# Patient Record
Sex: Female | Born: 1940
Health system: Southern US, Community
[De-identification: ages and names within clinical notes are randomized; demographics above are authoritative.]

## PROBLEM LIST (undated history)

## (undated) DIAGNOSIS — J45909 Unspecified asthma, uncomplicated: Secondary | ICD-10-CM

## (undated) DIAGNOSIS — M797 Fibromyalgia: Secondary | ICD-10-CM

## (undated) DIAGNOSIS — F4321 Adjustment disorder with depressed mood: Secondary | ICD-10-CM

## (undated) DIAGNOSIS — K219 Gastro-esophageal reflux disease without esophagitis: Secondary | ICD-10-CM

## (undated) DIAGNOSIS — K449 Diaphragmatic hernia without obstruction or gangrene: Secondary | ICD-10-CM

## (undated) DIAGNOSIS — E559 Vitamin D deficiency, unspecified: Secondary | ICD-10-CM

## (undated) DIAGNOSIS — G2581 Restless legs syndrome: Secondary | ICD-10-CM

## (undated) DIAGNOSIS — I776 Arteritis, unspecified: Secondary | ICD-10-CM

## (undated) DIAGNOSIS — I341 Nonrheumatic mitral (valve) prolapse: Secondary | ICD-10-CM

## (undated) DIAGNOSIS — I4891 Unspecified atrial fibrillation: Secondary | ICD-10-CM

## (undated) DIAGNOSIS — E213 Hyperparathyroidism, unspecified: Secondary | ICD-10-CM

## (undated) DIAGNOSIS — M3500 Sicca syndrome, unspecified: Secondary | ICD-10-CM

## (undated) DIAGNOSIS — M1990 Unspecified osteoarthritis, unspecified site: Secondary | ICD-10-CM

## (undated) DIAGNOSIS — I677 Cerebral arteritis, not elsewhere classified: Secondary | ICD-10-CM

## (undated) DIAGNOSIS — K317 Polyp of stomach and duodenum: Secondary | ICD-10-CM

## (undated) DIAGNOSIS — I420 Dilated cardiomyopathy: Secondary | ICD-10-CM

## (undated) DIAGNOSIS — I7300 Raynaud's syndrome without gangrene: Secondary | ICD-10-CM

## (undated) DIAGNOSIS — C569 Malignant neoplasm of unspecified ovary: Secondary | ICD-10-CM

## (undated) DIAGNOSIS — K589 Irritable bowel syndrome without diarrhea: Secondary | ICD-10-CM

## (undated) DIAGNOSIS — I495 Sick sinus syndrome: Secondary | ICD-10-CM

## (undated) DIAGNOSIS — J449 Chronic obstructive pulmonary disease, unspecified: Secondary | ICD-10-CM

## (undated) HISTORY — DX: Malignant neoplasm of unspecified ovary: C56.9

## (undated) HISTORY — PX: TUBAL LIGATION: SHX77

## (undated) HISTORY — DX: Vitamin D deficiency, unspecified: E55.9

## (undated) HISTORY — PX: CATARACT EXTRACTION: SUR2

## (undated) HISTORY — DX: Unspecified osteoarthritis, unspecified site: M19.90

## (undated) HISTORY — DX: Hyperparathyroidism, unspecified: E21.3

## (undated) HISTORY — PX: BREAST SURGERY: SHX581

## (undated) HISTORY — DX: Gastro-esophageal reflux disease without esophagitis: K21.9

## (undated) HISTORY — DX: Diaphragmatic hernia without obstruction or gangrene: K44.9

## (undated) HISTORY — DX: Polyp of stomach and duodenum: K31.7

## (undated) HISTORY — DX: Chronic obstructive pulmonary disease, unspecified: J44.9

## (undated) HISTORY — PX: BREAST BIOPSY: SHX20

## (undated) HISTORY — DX: Adjustment disorder with depressed mood: F43.21

## (undated) HISTORY — DX: Irritable bowel syndrome, unspecified: K58.9

## (undated) HISTORY — DX: Restless legs syndrome: G25.81

## (undated) HISTORY — DX: Dilated cardiomyopathy: I42.0

## (undated) HISTORY — DX: Sjogren syndrome, unspecified: M35.00

## (undated) HISTORY — PX: APPENDECTOMY: SHX54

## (undated) HISTORY — DX: Raynaud's syndrome without gangrene: I73.00

## (undated) HISTORY — DX: Nonrheumatic mitral (valve) prolapse: I34.1

## (undated) HISTORY — DX: Fibromyalgia: M79.7

## (undated) HISTORY — DX: Unspecified atrial fibrillation: I48.91

## (undated) HISTORY — DX: Arteritis, unspecified: I77.6

---

## 1960-01-15 HISTORY — PX: OOPHORECTOMY: SHX86

## 1978-01-14 HISTORY — PX: CARPAL TUNNEL RELEASE: SHX101

## 1980-01-15 HISTORY — PX: ABDOMINAL HYSTERECTOMY: SHX81

## 2003-01-15 HISTORY — PX: KNEE SURGERY: SHX244

## 2008-01-15 HISTORY — PX: CARPAL TUNNEL RELEASE: SHX101

## 2011-01-18 DIAGNOSIS — M542 Cervicalgia: Secondary | ICD-10-CM | POA: Diagnosis not present

## 2011-01-18 DIAGNOSIS — J45909 Unspecified asthma, uncomplicated: Secondary | ICD-10-CM | POA: Diagnosis not present

## 2011-01-18 DIAGNOSIS — J209 Acute bronchitis, unspecified: Secondary | ICD-10-CM | POA: Diagnosis not present

## 2011-02-06 DIAGNOSIS — R0609 Other forms of dyspnea: Secondary | ICD-10-CM | POA: Diagnosis not present

## 2011-02-06 DIAGNOSIS — R0602 Shortness of breath: Secondary | ICD-10-CM | POA: Diagnosis not present

## 2011-02-06 DIAGNOSIS — R05 Cough: Secondary | ICD-10-CM | POA: Diagnosis not present

## 2011-02-06 DIAGNOSIS — R0989 Other specified symptoms and signs involving the circulatory and respiratory systems: Secondary | ICD-10-CM | POA: Diagnosis not present

## 2011-02-07 DIAGNOSIS — I4891 Unspecified atrial fibrillation: Secondary | ICD-10-CM | POA: Diagnosis not present

## 2011-02-07 DIAGNOSIS — R609 Edema, unspecified: Secondary | ICD-10-CM | POA: Diagnosis not present

## 2011-02-07 DIAGNOSIS — J45909 Unspecified asthma, uncomplicated: Secondary | ICD-10-CM | POA: Diagnosis not present

## 2011-02-07 DIAGNOSIS — R5383 Other fatigue: Secondary | ICD-10-CM | POA: Diagnosis not present

## 2011-02-07 DIAGNOSIS — J019 Acute sinusitis, unspecified: Secondary | ICD-10-CM | POA: Diagnosis not present

## 2011-02-07 DIAGNOSIS — G8921 Chronic pain due to trauma: Secondary | ICD-10-CM | POA: Diagnosis not present

## 2011-02-07 DIAGNOSIS — R5381 Other malaise: Secondary | ICD-10-CM | POA: Diagnosis not present

## 2011-02-07 DIAGNOSIS — G4700 Insomnia, unspecified: Secondary | ICD-10-CM | POA: Diagnosis not present

## 2011-02-08 DIAGNOSIS — R609 Edema, unspecified: Secondary | ICD-10-CM | POA: Diagnosis not present

## 2011-02-08 DIAGNOSIS — R5381 Other malaise: Secondary | ICD-10-CM | POA: Diagnosis not present

## 2011-02-08 DIAGNOSIS — I4891 Unspecified atrial fibrillation: Secondary | ICD-10-CM | POA: Diagnosis not present

## 2011-02-08 DIAGNOSIS — R0602 Shortness of breath: Secondary | ICD-10-CM | POA: Diagnosis not present

## 2011-02-11 DIAGNOSIS — I4891 Unspecified atrial fibrillation: Secondary | ICD-10-CM | POA: Diagnosis not present

## 2011-02-11 DIAGNOSIS — I10 Essential (primary) hypertension: Secondary | ICD-10-CM | POA: Diagnosis not present

## 2011-02-11 DIAGNOSIS — I359 Nonrheumatic aortic valve disorder, unspecified: Secondary | ICD-10-CM | POA: Diagnosis not present

## 2011-02-18 DIAGNOSIS — R6889 Other general symptoms and signs: Secondary | ICD-10-CM | POA: Diagnosis not present

## 2011-02-18 DIAGNOSIS — I4891 Unspecified atrial fibrillation: Secondary | ICD-10-CM | POA: Diagnosis not present

## 2011-02-28 DIAGNOSIS — R7989 Other specified abnormal findings of blood chemistry: Secondary | ICD-10-CM | POA: Diagnosis not present

## 2011-03-04 DIAGNOSIS — M6530 Trigger finger, unspecified finger: Secondary | ICD-10-CM | POA: Diagnosis not present

## 2011-03-04 DIAGNOSIS — M1710 Unilateral primary osteoarthritis, unspecified knee: Secondary | ICD-10-CM | POA: Diagnosis not present

## 2011-03-11 DIAGNOSIS — M1710 Unilateral primary osteoarthritis, unspecified knee: Secondary | ICD-10-CM | POA: Diagnosis not present

## 2011-03-15 DIAGNOSIS — I4891 Unspecified atrial fibrillation: Secondary | ICD-10-CM | POA: Diagnosis not present

## 2011-03-18 DIAGNOSIS — G8929 Other chronic pain: Secondary | ICD-10-CM | POA: Diagnosis not present

## 2011-03-18 DIAGNOSIS — M25569 Pain in unspecified knee: Secondary | ICD-10-CM | POA: Diagnosis not present

## 2011-03-18 DIAGNOSIS — I10 Essential (primary) hypertension: Secondary | ICD-10-CM | POA: Diagnosis not present

## 2011-03-18 DIAGNOSIS — I4892 Unspecified atrial flutter: Secondary | ICD-10-CM | POA: Diagnosis not present

## 2011-03-18 DIAGNOSIS — G4700 Insomnia, unspecified: Secondary | ICD-10-CM | POA: Diagnosis not present

## 2011-03-29 DIAGNOSIS — R6889 Other general symptoms and signs: Secondary | ICD-10-CM | POA: Diagnosis not present

## 2011-03-29 DIAGNOSIS — I4891 Unspecified atrial fibrillation: Secondary | ICD-10-CM | POA: Diagnosis not present

## 2011-04-03 DIAGNOSIS — R6889 Other general symptoms and signs: Secondary | ICD-10-CM | POA: Diagnosis not present

## 2011-04-03 DIAGNOSIS — I4891 Unspecified atrial fibrillation: Secondary | ICD-10-CM | POA: Diagnosis not present

## 2011-04-04 DIAGNOSIS — M1710 Unilateral primary osteoarthritis, unspecified knee: Secondary | ICD-10-CM | POA: Diagnosis not present

## 2011-04-08 DIAGNOSIS — I4891 Unspecified atrial fibrillation: Secondary | ICD-10-CM | POA: Diagnosis not present

## 2011-04-08 DIAGNOSIS — R6889 Other general symptoms and signs: Secondary | ICD-10-CM | POA: Diagnosis not present

## 2011-04-23 DIAGNOSIS — R6889 Other general symptoms and signs: Secondary | ICD-10-CM | POA: Diagnosis not present

## 2011-04-23 DIAGNOSIS — I4891 Unspecified atrial fibrillation: Secondary | ICD-10-CM | POA: Diagnosis not present

## 2011-04-29 DIAGNOSIS — I4891 Unspecified atrial fibrillation: Secondary | ICD-10-CM | POA: Diagnosis not present

## 2011-04-29 DIAGNOSIS — R6889 Other general symptoms and signs: Secondary | ICD-10-CM | POA: Diagnosis not present

## 2011-05-06 DIAGNOSIS — R6889 Other general symptoms and signs: Secondary | ICD-10-CM | POA: Diagnosis not present

## 2011-05-06 DIAGNOSIS — I4891 Unspecified atrial fibrillation: Secondary | ICD-10-CM | POA: Diagnosis not present

## 2011-05-13 DIAGNOSIS — R6889 Other general symptoms and signs: Secondary | ICD-10-CM | POA: Diagnosis not present

## 2011-05-13 DIAGNOSIS — M6281 Muscle weakness (generalized): Secondary | ICD-10-CM | POA: Diagnosis not present

## 2011-05-13 DIAGNOSIS — R509 Fever, unspecified: Secondary | ICD-10-CM | POA: Diagnosis not present

## 2011-05-13 DIAGNOSIS — J45909 Unspecified asthma, uncomplicated: Secondary | ICD-10-CM | POA: Diagnosis not present

## 2011-05-13 DIAGNOSIS — I10 Essential (primary) hypertension: Secondary | ICD-10-CM | POA: Diagnosis not present

## 2011-05-13 DIAGNOSIS — R4589 Other symptoms and signs involving emotional state: Secondary | ICD-10-CM | POA: Diagnosis not present

## 2011-05-13 DIAGNOSIS — R51 Headache: Secondary | ICD-10-CM | POA: Diagnosis not present

## 2011-05-13 DIAGNOSIS — M25569 Pain in unspecified knee: Secondary | ICD-10-CM | POA: Diagnosis not present

## 2011-05-13 DIAGNOSIS — E559 Vitamin D deficiency, unspecified: Secondary | ICD-10-CM | POA: Diagnosis not present

## 2011-05-13 DIAGNOSIS — M3500 Sicca syndrome, unspecified: Secondary | ICD-10-CM | POA: Diagnosis not present

## 2011-05-13 DIAGNOSIS — I4891 Unspecified atrial fibrillation: Secondary | ICD-10-CM | POA: Diagnosis not present

## 2011-05-13 DIAGNOSIS — R5381 Other malaise: Secondary | ICD-10-CM | POA: Diagnosis not present

## 2011-05-13 DIAGNOSIS — G8921 Chronic pain due to trauma: Secondary | ICD-10-CM | POA: Diagnosis not present

## 2011-05-13 DIAGNOSIS — R21 Rash and other nonspecific skin eruption: Secondary | ICD-10-CM | POA: Diagnosis not present

## 2011-05-13 DIAGNOSIS — IMO0001 Reserved for inherently not codable concepts without codable children: Secondary | ICD-10-CM | POA: Diagnosis not present

## 2011-05-15 DIAGNOSIS — R0602 Shortness of breath: Secondary | ICD-10-CM | POA: Diagnosis not present

## 2011-05-15 DIAGNOSIS — J209 Acute bronchitis, unspecified: Secondary | ICD-10-CM | POA: Diagnosis not present

## 2011-05-20 DIAGNOSIS — L089 Local infection of the skin and subcutaneous tissue, unspecified: Secondary | ICD-10-CM | POA: Diagnosis not present

## 2011-05-20 DIAGNOSIS — B079 Viral wart, unspecified: Secondary | ICD-10-CM | POA: Diagnosis not present

## 2011-05-28 DIAGNOSIS — H25019 Cortical age-related cataract, unspecified eye: Secondary | ICD-10-CM | POA: Diagnosis not present

## 2011-05-28 DIAGNOSIS — H16229 Keratoconjunctivitis sicca, not specified as Sjogren's, unspecified eye: Secondary | ICD-10-CM | POA: Diagnosis not present

## 2011-05-28 DIAGNOSIS — M3500 Sicca syndrome, unspecified: Secondary | ICD-10-CM | POA: Diagnosis not present

## 2011-05-28 DIAGNOSIS — H2510 Age-related nuclear cataract, unspecified eye: Secondary | ICD-10-CM | POA: Diagnosis not present

## 2011-06-13 DIAGNOSIS — I10 Essential (primary) hypertension: Secondary | ICD-10-CM | POA: Diagnosis not present

## 2011-06-14 DIAGNOSIS — I10 Essential (primary) hypertension: Secondary | ICD-10-CM | POA: Diagnosis not present

## 2011-06-14 DIAGNOSIS — R4589 Other symptoms and signs involving emotional state: Secondary | ICD-10-CM | POA: Diagnosis not present

## 2011-06-28 DIAGNOSIS — I4891 Unspecified atrial fibrillation: Secondary | ICD-10-CM | POA: Diagnosis not present

## 2011-06-28 DIAGNOSIS — R6889 Other general symptoms and signs: Secondary | ICD-10-CM | POA: Diagnosis not present

## 2011-07-02 DIAGNOSIS — J449 Chronic obstructive pulmonary disease, unspecified: Secondary | ICD-10-CM | POA: Diagnosis not present

## 2011-07-02 DIAGNOSIS — IMO0001 Reserved for inherently not codable concepts without codable children: Secondary | ICD-10-CM | POA: Diagnosis not present

## 2011-07-02 DIAGNOSIS — M3500 Sicca syndrome, unspecified: Secondary | ICD-10-CM | POA: Diagnosis not present

## 2011-07-02 DIAGNOSIS — M130 Polyarthritis, unspecified: Secondary | ICD-10-CM | POA: Diagnosis not present

## 2011-07-26 DIAGNOSIS — R4589 Other symptoms and signs involving emotional state: Secondary | ICD-10-CM | POA: Diagnosis not present

## 2011-07-26 DIAGNOSIS — G4700 Insomnia, unspecified: Secondary | ICD-10-CM | POA: Diagnosis not present

## 2011-07-26 DIAGNOSIS — R5381 Other malaise: Secondary | ICD-10-CM | POA: Diagnosis not present

## 2011-07-26 DIAGNOSIS — I10 Essential (primary) hypertension: Secondary | ICD-10-CM | POA: Diagnosis not present

## 2011-08-06 DIAGNOSIS — I10 Essential (primary) hypertension: Secondary | ICD-10-CM | POA: Diagnosis not present

## 2011-08-06 DIAGNOSIS — R5381 Other malaise: Secondary | ICD-10-CM | POA: Diagnosis not present

## 2011-08-06 DIAGNOSIS — I4892 Unspecified atrial flutter: Secondary | ICD-10-CM | POA: Diagnosis not present

## 2011-08-06 DIAGNOSIS — G8929 Other chronic pain: Secondary | ICD-10-CM | POA: Diagnosis not present

## 2011-08-06 DIAGNOSIS — IMO0001 Reserved for inherently not codable concepts without codable children: Secondary | ICD-10-CM | POA: Diagnosis not present

## 2011-08-06 DIAGNOSIS — R110 Nausea: Secondary | ICD-10-CM | POA: Diagnosis not present

## 2011-08-06 DIAGNOSIS — R509 Fever, unspecified: Secondary | ICD-10-CM | POA: Diagnosis not present

## 2011-08-06 DIAGNOSIS — R5383 Other fatigue: Secondary | ICD-10-CM | POA: Diagnosis not present

## 2011-08-15 DIAGNOSIS — R5381 Other malaise: Secondary | ICD-10-CM | POA: Diagnosis not present

## 2011-08-15 DIAGNOSIS — M6281 Muscle weakness (generalized): Secondary | ICD-10-CM | POA: Diagnosis not present

## 2011-08-15 DIAGNOSIS — M542 Cervicalgia: Secondary | ICD-10-CM | POA: Diagnosis not present

## 2011-08-15 DIAGNOSIS — I10 Essential (primary) hypertension: Secondary | ICD-10-CM | POA: Diagnosis not present

## 2011-08-15 DIAGNOSIS — G8929 Other chronic pain: Secondary | ICD-10-CM | POA: Diagnosis not present

## 2011-08-15 DIAGNOSIS — R4589 Other symptoms and signs involving emotional state: Secondary | ICD-10-CM | POA: Diagnosis not present

## 2011-08-15 DIAGNOSIS — I4892 Unspecified atrial flutter: Secondary | ICD-10-CM | POA: Diagnosis not present

## 2011-08-15 DIAGNOSIS — R5383 Other fatigue: Secondary | ICD-10-CM | POA: Diagnosis not present

## 2011-08-15 DIAGNOSIS — R509 Fever, unspecified: Secondary | ICD-10-CM | POA: Diagnosis not present

## 2011-08-19 DIAGNOSIS — R6889 Other general symptoms and signs: Secondary | ICD-10-CM | POA: Diagnosis not present

## 2011-08-19 DIAGNOSIS — IMO0001 Reserved for inherently not codable concepts without codable children: Secondary | ICD-10-CM | POA: Diagnosis not present

## 2011-08-19 DIAGNOSIS — I4891 Unspecified atrial fibrillation: Secondary | ICD-10-CM | POA: Diagnosis not present

## 2011-08-19 DIAGNOSIS — R7309 Other abnormal glucose: Secondary | ICD-10-CM | POA: Diagnosis not present

## 2011-08-22 DIAGNOSIS — I4891 Unspecified atrial fibrillation: Secondary | ICD-10-CM | POA: Diagnosis not present

## 2011-08-22 DIAGNOSIS — R6889 Other general symptoms and signs: Secondary | ICD-10-CM | POA: Diagnosis not present

## 2011-08-29 DIAGNOSIS — R6889 Other general symptoms and signs: Secondary | ICD-10-CM | POA: Diagnosis not present

## 2011-08-29 DIAGNOSIS — I4891 Unspecified atrial fibrillation: Secondary | ICD-10-CM | POA: Diagnosis not present

## 2011-09-30 DIAGNOSIS — J449 Chronic obstructive pulmonary disease, unspecified: Secondary | ICD-10-CM | POA: Diagnosis not present

## 2011-10-01 DIAGNOSIS — H47019 Ischemic optic neuropathy, unspecified eye: Secondary | ICD-10-CM | POA: Diagnosis not present

## 2011-10-01 DIAGNOSIS — H479 Unspecified disorder of visual pathways: Secondary | ICD-10-CM | POA: Diagnosis not present

## 2011-10-01 DIAGNOSIS — H47099 Other disorders of optic nerve, not elsewhere classified, unspecified eye: Secondary | ICD-10-CM | POA: Diagnosis not present

## 2011-10-07 DIAGNOSIS — R6889 Other general symptoms and signs: Secondary | ICD-10-CM | POA: Diagnosis not present

## 2011-10-07 DIAGNOSIS — I4891 Unspecified atrial fibrillation: Secondary | ICD-10-CM | POA: Diagnosis not present

## 2011-10-14 DIAGNOSIS — E039 Hypothyroidism, unspecified: Secondary | ICD-10-CM | POA: Diagnosis not present

## 2011-10-14 DIAGNOSIS — H5200 Hypermetropia, unspecified eye: Secondary | ICD-10-CM | POA: Diagnosis not present

## 2011-10-14 DIAGNOSIS — M1990 Unspecified osteoarthritis, unspecified site: Secondary | ICD-10-CM | POA: Diagnosis not present

## 2011-10-14 DIAGNOSIS — H2510 Age-related nuclear cataract, unspecified eye: Secondary | ICD-10-CM | POA: Diagnosis not present

## 2011-10-15 DIAGNOSIS — R6889 Other general symptoms and signs: Secondary | ICD-10-CM | POA: Diagnosis not present

## 2011-10-15 DIAGNOSIS — I4891 Unspecified atrial fibrillation: Secondary | ICD-10-CM | POA: Diagnosis not present

## 2011-10-21 DIAGNOSIS — IMO0001 Reserved for inherently not codable concepts without codable children: Secondary | ICD-10-CM | POA: Diagnosis not present

## 2011-10-21 DIAGNOSIS — R4589 Other symptoms and signs involving emotional state: Secondary | ICD-10-CM | POA: Diagnosis not present

## 2011-10-21 DIAGNOSIS — I4891 Unspecified atrial fibrillation: Secondary | ICD-10-CM | POA: Diagnosis not present

## 2011-10-21 DIAGNOSIS — I10 Essential (primary) hypertension: Secondary | ICD-10-CM | POA: Diagnosis not present

## 2011-10-21 DIAGNOSIS — W57XXXA Bitten or stung by nonvenomous insect and other nonvenomous arthropods, initial encounter: Secondary | ICD-10-CM | POA: Diagnosis not present

## 2011-10-21 DIAGNOSIS — R5381 Other malaise: Secondary | ICD-10-CM | POA: Diagnosis not present

## 2011-10-21 DIAGNOSIS — S90569A Insect bite (nonvenomous), unspecified ankle, initial encounter: Secondary | ICD-10-CM | POA: Diagnosis not present

## 2011-10-25 DIAGNOSIS — H2510 Age-related nuclear cataract, unspecified eye: Secondary | ICD-10-CM | POA: Diagnosis not present

## 2011-10-29 DIAGNOSIS — I4891 Unspecified atrial fibrillation: Secondary | ICD-10-CM | POA: Diagnosis not present

## 2011-10-29 DIAGNOSIS — R6889 Other general symptoms and signs: Secondary | ICD-10-CM | POA: Diagnosis not present

## 2011-11-01 DIAGNOSIS — M25569 Pain in unspecified knee: Secondary | ICD-10-CM | POA: Diagnosis not present

## 2011-11-01 DIAGNOSIS — IMO0001 Reserved for inherently not codable concepts without codable children: Secondary | ICD-10-CM | POA: Diagnosis not present

## 2011-11-01 DIAGNOSIS — I4892 Unspecified atrial flutter: Secondary | ICD-10-CM | POA: Diagnosis not present

## 2011-11-01 DIAGNOSIS — K2960 Other gastritis without bleeding: Secondary | ICD-10-CM | POA: Diagnosis not present

## 2011-11-01 DIAGNOSIS — M129 Arthropathy, unspecified: Secondary | ICD-10-CM | POA: Diagnosis not present

## 2011-11-01 DIAGNOSIS — I10 Essential (primary) hypertension: Secondary | ICD-10-CM | POA: Diagnosis not present

## 2011-11-01 DIAGNOSIS — R5381 Other malaise: Secondary | ICD-10-CM | POA: Diagnosis not present

## 2011-11-02 DIAGNOSIS — I4891 Unspecified atrial fibrillation: Secondary | ICD-10-CM | POA: Diagnosis not present

## 2011-11-04 DIAGNOSIS — IMO0001 Reserved for inherently not codable concepts without codable children: Secondary | ICD-10-CM | POA: Diagnosis not present

## 2011-11-04 DIAGNOSIS — M130 Polyarthritis, unspecified: Secondary | ICD-10-CM | POA: Diagnosis not present

## 2011-11-04 DIAGNOSIS — M3500 Sicca syndrome, unspecified: Secondary | ICD-10-CM | POA: Diagnosis not present

## 2011-11-04 DIAGNOSIS — M25559 Pain in unspecified hip: Secondary | ICD-10-CM | POA: Diagnosis not present

## 2011-11-06 DIAGNOSIS — R6889 Other general symptoms and signs: Secondary | ICD-10-CM | POA: Diagnosis not present

## 2011-11-06 DIAGNOSIS — M130 Polyarthritis, unspecified: Secondary | ICD-10-CM | POA: Diagnosis not present

## 2011-11-06 DIAGNOSIS — M3500 Sicca syndrome, unspecified: Secondary | ICD-10-CM | POA: Diagnosis not present

## 2011-11-06 DIAGNOSIS — I4891 Unspecified atrial fibrillation: Secondary | ICD-10-CM | POA: Diagnosis not present

## 2011-11-08 DIAGNOSIS — J019 Acute sinusitis, unspecified: Secondary | ICD-10-CM | POA: Diagnosis not present

## 2011-11-11 DIAGNOSIS — J449 Chronic obstructive pulmonary disease, unspecified: Secondary | ICD-10-CM | POA: Diagnosis not present

## 2011-11-12 DIAGNOSIS — Z7901 Long term (current) use of anticoagulants: Secondary | ICD-10-CM | POA: Diagnosis not present

## 2011-11-12 DIAGNOSIS — G2581 Restless legs syndrome: Secondary | ICD-10-CM | POA: Diagnosis not present

## 2011-11-12 DIAGNOSIS — I10 Essential (primary) hypertension: Secondary | ICD-10-CM | POA: Diagnosis not present

## 2011-11-12 DIAGNOSIS — E039 Hypothyroidism, unspecified: Secondary | ICD-10-CM | POA: Diagnosis not present

## 2011-11-12 DIAGNOSIS — M1990 Unspecified osteoarthritis, unspecified site: Secondary | ICD-10-CM | POA: Diagnosis not present

## 2011-11-12 DIAGNOSIS — J40 Bronchitis, not specified as acute or chronic: Secondary | ICD-10-CM | POA: Diagnosis not present

## 2011-11-12 DIAGNOSIS — IMO0001 Reserved for inherently not codable concepts without codable children: Secondary | ICD-10-CM | POA: Diagnosis not present

## 2011-11-12 DIAGNOSIS — H269 Unspecified cataract: Secondary | ICD-10-CM | POA: Diagnosis not present

## 2011-11-12 DIAGNOSIS — J45909 Unspecified asthma, uncomplicated: Secondary | ICD-10-CM | POA: Diagnosis not present

## 2011-11-12 DIAGNOSIS — H2510 Age-related nuclear cataract, unspecified eye: Secondary | ICD-10-CM | POA: Diagnosis not present

## 2011-11-12 DIAGNOSIS — K219 Gastro-esophageal reflux disease without esophagitis: Secondary | ICD-10-CM | POA: Diagnosis not present

## 2011-11-13 DIAGNOSIS — I4891 Unspecified atrial fibrillation: Secondary | ICD-10-CM | POA: Diagnosis not present

## 2011-11-13 DIAGNOSIS — R6889 Other general symptoms and signs: Secondary | ICD-10-CM | POA: Diagnosis not present

## 2011-11-15 DIAGNOSIS — M76899 Other specified enthesopathies of unspecified lower limb, excluding foot: Secondary | ICD-10-CM | POA: Diagnosis not present

## 2011-11-18 DIAGNOSIS — B079 Viral wart, unspecified: Secondary | ICD-10-CM | POA: Diagnosis not present

## 2011-11-18 DIAGNOSIS — L089 Local infection of the skin and subcutaneous tissue, unspecified: Secondary | ICD-10-CM | POA: Diagnosis not present

## 2011-11-19 DIAGNOSIS — F4321 Adjustment disorder with depressed mood: Secondary | ICD-10-CM | POA: Diagnosis not present

## 2011-11-21 DIAGNOSIS — H04129 Dry eye syndrome of unspecified lacrimal gland: Secondary | ICD-10-CM | POA: Diagnosis not present

## 2011-11-21 DIAGNOSIS — R6889 Other general symptoms and signs: Secondary | ICD-10-CM | POA: Diagnosis not present

## 2011-11-21 DIAGNOSIS — H2510 Age-related nuclear cataract, unspecified eye: Secondary | ICD-10-CM | POA: Diagnosis not present

## 2011-11-21 DIAGNOSIS — Z961 Presence of intraocular lens: Secondary | ICD-10-CM | POA: Diagnosis not present

## 2011-11-21 DIAGNOSIS — I4891 Unspecified atrial fibrillation: Secondary | ICD-10-CM | POA: Diagnosis not present

## 2011-11-25 DIAGNOSIS — F4321 Adjustment disorder with depressed mood: Secondary | ICD-10-CM | POA: Diagnosis not present

## 2011-11-25 DIAGNOSIS — Z1231 Encounter for screening mammogram for malignant neoplasm of breast: Secondary | ICD-10-CM | POA: Diagnosis not present

## 2011-11-25 DIAGNOSIS — Z803 Family history of malignant neoplasm of breast: Secondary | ICD-10-CM | POA: Diagnosis not present

## 2011-11-26 DIAGNOSIS — I4891 Unspecified atrial fibrillation: Secondary | ICD-10-CM | POA: Diagnosis not present

## 2011-11-26 DIAGNOSIS — H269 Unspecified cataract: Secondary | ICD-10-CM | POA: Diagnosis not present

## 2011-11-26 DIAGNOSIS — E039 Hypothyroidism, unspecified: Secondary | ICD-10-CM | POA: Diagnosis not present

## 2011-11-26 DIAGNOSIS — J45909 Unspecified asthma, uncomplicated: Secondary | ICD-10-CM | POA: Diagnosis not present

## 2011-11-26 DIAGNOSIS — I10 Essential (primary) hypertension: Secondary | ICD-10-CM | POA: Diagnosis not present

## 2011-11-26 DIAGNOSIS — IMO0001 Reserved for inherently not codable concepts without codable children: Secondary | ICD-10-CM | POA: Diagnosis not present

## 2011-11-26 DIAGNOSIS — M1990 Unspecified osteoarthritis, unspecified site: Secondary | ICD-10-CM | POA: Diagnosis not present

## 2011-11-26 DIAGNOSIS — K219 Gastro-esophageal reflux disease without esophagitis: Secondary | ICD-10-CM | POA: Diagnosis not present

## 2011-11-26 DIAGNOSIS — H2510 Age-related nuclear cataract, unspecified eye: Secondary | ICD-10-CM | POA: Diagnosis not present

## 2011-11-27 DIAGNOSIS — I4891 Unspecified atrial fibrillation: Secondary | ICD-10-CM | POA: Diagnosis not present

## 2011-11-27 DIAGNOSIS — R6889 Other general symptoms and signs: Secondary | ICD-10-CM | POA: Diagnosis not present

## 2011-11-29 DIAGNOSIS — H1130 Conjunctival hemorrhage, unspecified eye: Secondary | ICD-10-CM | POA: Diagnosis not present

## 2011-11-29 DIAGNOSIS — Z961 Presence of intraocular lens: Secondary | ICD-10-CM | POA: Diagnosis not present

## 2011-12-03 DIAGNOSIS — R928 Other abnormal and inconclusive findings on diagnostic imaging of breast: Secondary | ICD-10-CM | POA: Diagnosis not present

## 2011-12-03 DIAGNOSIS — Z803 Family history of malignant neoplasm of breast: Secondary | ICD-10-CM | POA: Diagnosis not present

## 2011-12-03 DIAGNOSIS — F4321 Adjustment disorder with depressed mood: Secondary | ICD-10-CM | POA: Diagnosis not present

## 2011-12-05 DIAGNOSIS — Z01419 Encounter for gynecological examination (general) (routine) without abnormal findings: Secondary | ICD-10-CM | POA: Diagnosis not present

## 2011-12-06 DIAGNOSIS — Z01419 Encounter for gynecological examination (general) (routine) without abnormal findings: Secondary | ICD-10-CM | POA: Diagnosis not present

## 2011-12-06 DIAGNOSIS — R4589 Other symptoms and signs involving emotional state: Secondary | ICD-10-CM | POA: Diagnosis not present

## 2011-12-06 DIAGNOSIS — R5383 Other fatigue: Secondary | ICD-10-CM | POA: Diagnosis not present

## 2011-12-06 DIAGNOSIS — I10 Essential (primary) hypertension: Secondary | ICD-10-CM | POA: Diagnosis not present

## 2011-12-06 DIAGNOSIS — M129 Arthropathy, unspecified: Secondary | ICD-10-CM | POA: Diagnosis not present

## 2011-12-06 DIAGNOSIS — Z0000 Encounter for general adult medical examination without abnormal findings: Secondary | ICD-10-CM | POA: Diagnosis not present

## 2011-12-06 DIAGNOSIS — I4891 Unspecified atrial fibrillation: Secondary | ICD-10-CM | POA: Diagnosis not present

## 2011-12-10 DIAGNOSIS — F4321 Adjustment disorder with depressed mood: Secondary | ICD-10-CM | POA: Diagnosis not present

## 2011-12-11 DIAGNOSIS — N6009 Solitary cyst of unspecified breast: Secondary | ICD-10-CM | POA: Diagnosis not present

## 2011-12-11 DIAGNOSIS — R928 Other abnormal and inconclusive findings on diagnostic imaging of breast: Secondary | ICD-10-CM | POA: Diagnosis not present

## 2011-12-11 DIAGNOSIS — N6089 Other benign mammary dysplasias of unspecified breast: Secondary | ICD-10-CM | POA: Diagnosis not present

## 2011-12-11 DIAGNOSIS — N6049 Mammary duct ectasia of unspecified breast: Secondary | ICD-10-CM | POA: Diagnosis not present

## 2011-12-11 DIAGNOSIS — N6489 Other specified disorders of breast: Secondary | ICD-10-CM | POA: Diagnosis not present

## 2011-12-16 DIAGNOSIS — Z961 Presence of intraocular lens: Secondary | ICD-10-CM | POA: Diagnosis not present

## 2011-12-17 DIAGNOSIS — F4321 Adjustment disorder with depressed mood: Secondary | ICD-10-CM | POA: Diagnosis not present

## 2011-12-20 DIAGNOSIS — R6889 Other general symptoms and signs: Secondary | ICD-10-CM | POA: Diagnosis not present

## 2011-12-20 DIAGNOSIS — I4891 Unspecified atrial fibrillation: Secondary | ICD-10-CM | POA: Diagnosis not present

## 2011-12-23 DIAGNOSIS — E039 Hypothyroidism, unspecified: Secondary | ICD-10-CM | POA: Diagnosis not present

## 2011-12-23 DIAGNOSIS — H538 Other visual disturbances: Secondary | ICD-10-CM | POA: Diagnosis not present

## 2011-12-23 DIAGNOSIS — R5383 Other fatigue: Secondary | ICD-10-CM | POA: Diagnosis not present

## 2011-12-23 DIAGNOSIS — R4589 Other symptoms and signs involving emotional state: Secondary | ICD-10-CM | POA: Diagnosis not present

## 2011-12-23 DIAGNOSIS — R5381 Other malaise: Secondary | ICD-10-CM | POA: Diagnosis not present

## 2011-12-23 DIAGNOSIS — I4891 Unspecified atrial fibrillation: Secondary | ICD-10-CM | POA: Diagnosis not present

## 2011-12-23 DIAGNOSIS — M129 Arthropathy, unspecified: Secondary | ICD-10-CM | POA: Diagnosis not present

## 2011-12-23 DIAGNOSIS — M542 Cervicalgia: Secondary | ICD-10-CM | POA: Diagnosis not present

## 2011-12-23 DIAGNOSIS — I10 Essential (primary) hypertension: Secondary | ICD-10-CM | POA: Diagnosis not present

## 2011-12-24 DIAGNOSIS — I10 Essential (primary) hypertension: Secondary | ICD-10-CM | POA: Diagnosis not present

## 2011-12-24 DIAGNOSIS — I4891 Unspecified atrial fibrillation: Secondary | ICD-10-CM | POA: Diagnosis not present

## 2011-12-24 DIAGNOSIS — I359 Nonrheumatic aortic valve disorder, unspecified: Secondary | ICD-10-CM | POA: Diagnosis not present

## 2011-12-26 DIAGNOSIS — Z23 Encounter for immunization: Secondary | ICD-10-CM | POA: Diagnosis not present

## 2012-01-02 DIAGNOSIS — F4321 Adjustment disorder with depressed mood: Secondary | ICD-10-CM | POA: Diagnosis not present

## 2012-01-13 DIAGNOSIS — Z7901 Long term (current) use of anticoagulants: Secondary | ICD-10-CM | POA: Diagnosis not present

## 2012-01-15 HISTORY — PX: REFRACTIVE SURGERY: SHX103

## 2012-02-10 DIAGNOSIS — Z7901 Long term (current) use of anticoagulants: Secondary | ICD-10-CM | POA: Diagnosis not present

## 2012-02-13 DIAGNOSIS — Z7901 Long term (current) use of anticoagulants: Secondary | ICD-10-CM | POA: Diagnosis not present

## 2012-02-20 DIAGNOSIS — S99919A Unspecified injury of unspecified ankle, initial encounter: Secondary | ICD-10-CM | POA: Diagnosis not present

## 2012-02-20 DIAGNOSIS — S8990XA Unspecified injury of unspecified lower leg, initial encounter: Secondary | ICD-10-CM | POA: Diagnosis not present

## 2012-03-05 DIAGNOSIS — M545 Low back pain, unspecified: Secondary | ICD-10-CM | POA: Diagnosis not present

## 2012-03-05 DIAGNOSIS — M1710 Unilateral primary osteoarthritis, unspecified knee: Secondary | ICD-10-CM | POA: Diagnosis not present

## 2012-03-19 DIAGNOSIS — M67919 Unspecified disorder of synovium and tendon, unspecified shoulder: Secondary | ICD-10-CM | POA: Diagnosis not present

## 2012-04-29 ENCOUNTER — Ambulatory Visit: Payer: Medicare Other | Admitting: *Deleted

## 2012-04-29 ENCOUNTER — Encounter (HOSPITAL_COMMUNITY): Payer: Self-pay

## 2012-04-29 ENCOUNTER — Emergency Department (HOSPITAL_COMMUNITY): Payer: Medicare Other

## 2012-04-29 ENCOUNTER — Emergency Department (HOSPITAL_COMMUNITY)
Admission: EM | Admit: 2012-04-29 | Discharge: 2012-04-29 | Disposition: A | Discharge status: Home / Self Care | Payer: Medicare Other | Attending: Emergency Medicine | Admitting: Emergency Medicine

## 2012-04-29 VITALS — BP 121/80 | HR 65 | Temp 98.4°F

## 2012-04-29 DIAGNOSIS — W64XXXA Exposure to other animate mechanical forces, initial encounter: Secondary | ICD-10-CM | POA: Insufficient documentation

## 2012-04-29 DIAGNOSIS — H539 Unspecified visual disturbance: Secondary | ICD-10-CM | POA: Insufficient documentation

## 2012-04-29 DIAGNOSIS — K219 Gastro-esophageal reflux disease without esophagitis: Secondary | ICD-10-CM | POA: Diagnosis not present

## 2012-04-29 DIAGNOSIS — S8990XA Unspecified injury of unspecified lower leg, initial encounter: Secondary | ICD-10-CM | POA: Insufficient documentation

## 2012-04-29 DIAGNOSIS — R51 Headache: Secondary | ICD-10-CM

## 2012-04-29 DIAGNOSIS — R5381 Other malaise: Secondary | ICD-10-CM | POA: Insufficient documentation

## 2012-04-29 DIAGNOSIS — S0990XA Unspecified injury of head, initial encounter: Secondary | ICD-10-CM

## 2012-04-29 DIAGNOSIS — I4891 Unspecified atrial fibrillation: Secondary | ICD-10-CM | POA: Insufficient documentation

## 2012-04-29 DIAGNOSIS — Z8739 Personal history of other diseases of the musculoskeletal system and connective tissue: Secondary | ICD-10-CM | POA: Insufficient documentation

## 2012-04-29 DIAGNOSIS — R42 Dizziness and giddiness: Secondary | ICD-10-CM

## 2012-04-29 DIAGNOSIS — Z8719 Personal history of other diseases of the digestive system: Secondary | ICD-10-CM | POA: Insufficient documentation

## 2012-04-29 DIAGNOSIS — M62838 Other muscle spasm: Secondary | ICD-10-CM | POA: Diagnosis not present

## 2012-04-29 DIAGNOSIS — S99929A Unspecified injury of unspecified foot, initial encounter: Secondary | ICD-10-CM | POA: Insufficient documentation

## 2012-04-29 DIAGNOSIS — Z79899 Other long term (current) drug therapy: Secondary | ICD-10-CM | POA: Diagnosis not present

## 2012-04-29 DIAGNOSIS — Z7901 Long term (current) use of anticoagulants: Secondary | ICD-10-CM | POA: Diagnosis not present

## 2012-04-29 DIAGNOSIS — Z8543 Personal history of malignant neoplasm of ovary: Secondary | ICD-10-CM | POA: Insufficient documentation

## 2012-04-29 DIAGNOSIS — G2581 Restless legs syndrome: Secondary | ICD-10-CM | POA: Insufficient documentation

## 2012-04-29 DIAGNOSIS — M1990 Unspecified osteoarthritis, unspecified site: Secondary | ICD-10-CM | POA: Insufficient documentation

## 2012-04-29 DIAGNOSIS — Y929 Unspecified place or not applicable: Secondary | ICD-10-CM | POA: Insufficient documentation

## 2012-04-29 DIAGNOSIS — J449 Chronic obstructive pulmonary disease, unspecified: Secondary | ICD-10-CM | POA: Diagnosis not present

## 2012-04-29 DIAGNOSIS — Z8679 Personal history of other diseases of the circulatory system: Secondary | ICD-10-CM | POA: Diagnosis not present

## 2012-04-29 DIAGNOSIS — J4489 Other specified chronic obstructive pulmonary disease: Secondary | ICD-10-CM | POA: Insufficient documentation

## 2012-04-29 DIAGNOSIS — Y9301 Activity, walking, marching and hiking: Secondary | ICD-10-CM | POA: Insufficient documentation

## 2012-04-29 HISTORY — DX: Cerebral arteritis, not elsewhere classified: I67.7

## 2012-04-29 HISTORY — DX: Unspecified asthma, uncomplicated: J45.909

## 2012-04-29 LAB — BASIC METABOLIC PANEL
BUN: 16 mg/dL (ref 6–23)
CO2: 28 mEq/L (ref 19–32)
Chloride: 101 mEq/L (ref 96–112)
Creatinine, Ser: 0.84 mg/dL (ref 0.50–1.10)
Glucose, Bld: 106 mg/dL — ABNORMAL HIGH (ref 70–99)

## 2012-04-29 NOTE — ED Provider Notes (Signed)
History     CSN: [id number]  Arrival date & time 04/29/12  1622   First MD Initiated Contact with Patient 04/29/12 1644      No chief complaint on file.   (Consider location/radiation/quality/duration/timing/severity/associated sxs/prior treatment) The history is provided by the patient.   5 days ago patient states she was out walking with her dog the dog raise his head up and hit her in the right thigh. She states her glasses were pushed back under her eye. She states that she's had some pain since. She states that she was having headaches before the accident but it has been worse and more on the right side of her head since. It is dull. She also states she has had difficulty seeing laterally out of the right eye before this and it is worse now. She also states she's felt a little more dizzy. She is unable to clearly state what this means but states it feels like she is having problems with her eyes. No confusion. She states she has had more pain in her knees also. She states she also feels as if she has had more problems in the muscles on her right lower extremity. She is on Coumadin for atrial fibrillation. She has a history of vasculitis also. No chest pain. She states she's also had some muscle cramps. No recent change in medications. She states her eyes have also been more dry also.  Past Medical History  Diagnosis Date  . A-fib   . Sjogren's syndrome   . Fibromyalgia   . Raynaud's disease   . GERD (gastroesophageal reflux disease)   . RLS (restless legs syndrome)   . Osteoarthritis   . IBS (irritable bowel syndrome)   . COPD (chronic obstructive pulmonary disease)   . Ovarian cancer     lymph node removal with hysterectomy  . MVP (mitral valve prolapse)   . Asthma   . Cerebral vasculitis     Past Surgical History  Procedure Laterality Date  . Appendectomy    . Abdominal hysterectomy    . Cataract extraction Left   . Breast surgery      Biopsy    Family History   Problem Relation Age of Onset  . COPD Mother   . Cancer Mother     Breast  . Heart disease Father   . Kidney disease Father   . Stroke Sister   . Arthritis/Rheumatoid Sister   . Stroke Paternal Grandmother   . Scleroderma Grandchild   . Thyroid disease Other     History  Substance Use Topics  . Smoking status: Never Smoker   . Smokeless tobacco: Never Used  . Alcohol Use: No    OB History   Grav Para Term Preterm Abortions TAB SAB Ect Mult Living                  Review of Systems  Constitutional: Positive for fatigue. Negative for activity change and appetite change.  HENT: Negative for neck stiffness.   Eyes: Positive for visual disturbance. Negative for photophobia and pain.  Respiratory: Negative for chest tightness and shortness of breath.   Cardiovascular: Negative for chest pain and leg swelling.  Gastrointestinal: Negative for nausea, vomiting, abdominal pain, diarrhea and blood in stool.  Genitourinary: Negative for flank pain.  Musculoskeletal: Positive for gait problem. Negative for back pain.  Skin: Negative for pallor, rash and wound.  Neurological: Positive for dizziness and headaches. Negative for weakness and numbness.  Psychiatric/Behavioral: Negative for  behavioral problems.    Allergies  Cephalosporins; Ciprofloxacin; Doxycycline; Nitrofuran derivatives; Nitrous oxide; Other; Penicillins; Sulfa antibiotics; Talwin; Amlodipine; Codeine; Diovan; Lisinopril; Tramadol; Zoloft; and Trovan  Home Medications   Current Outpatient Rx  Name  Route  Sig  Dispense  Refill  . albuterol (PROVENTIL HFA;VENTOLIN HFA) 108 (90 BASE) MCG/ACT inhaler   Inhalation   Inhale 2 puffs into the lungs every 6 (six) hours as needed for wheezing.         Marland Kitchen atenolol (TENORMIN) 50 MG tablet   Oral   Take 50 mg by mouth every morning.          Marland Kitchen atorvastatin (LIPITOR) 20 MG tablet   Oral   Take 20 mg by mouth at bedtime.          . budesonide-formoterol  (SYMBICORT) 160-4.5 MCG/ACT inhaler   Inhalation   Inhale 1 puff into the lungs 2 (two) times daily.         . bumetanide (BUMEX) 0.5 MG tablet   Oral   Take 0.5 mg by mouth every morning.         . Cholecalciferol (VITAMIN D) 2000 UNITS CAPS   Oral   Take 1 capsule by mouth every morning.         . cloNIDine (CATAPRES) 0.1 MG tablet   Oral   Take 0.1 mg by mouth 2 (two) times daily.         . cyclobenzaprine (FLEXERIL) 5 MG tablet   Oral   Take 5 mg by mouth 3 (three) times daily as needed for muscle spasms.         . cycloSPORINE (RESTASIS) 0.05 % ophthalmic emulsion   Both Eyes   Place 1 drop into both eyes 2 (two) times daily.          . diazepam (VALIUM) 5 MG tablet   Oral   Take 5 mg by mouth every 6 (six) hours as needed for anxiety.         Marland Kitchen esomeprazole (NEXIUM) 40 MG capsule   Oral   Take 40 mg by mouth daily before breakfast.         . fexofenadine (ALLEGRA) 180 MG tablet   Oral   Take 180 mg by mouth every morning.          Marland Kitchen levothyroxine (SYNTHROID, LEVOTHROID) 100 MCG tablet   Oral   Take 100 mcg by mouth every morning.         Marland Kitchen losartan (COZAAR) 100 MG tablet   Oral   Take 100 mg by mouth at bedtime.          . metroNIDAZOLE (METROGEL) 1 % gel   Topical   Apply 1 application topically daily.          . montelukast (SINGULAIR) 10 MG tablet   Oral   Take 10 mg by mouth at bedtime.         Marland Kitchen oxyCODONE (OXYCONTIN) 10 MG 12 hr tablet   Oral   Take 10 mg by mouth every 12 (twelve) hours.         . predniSONE (DELTASONE) 5 MG tablet   Oral   Take 5 mg by mouth daily.         . ranitidine (ZANTAC) 150 MG tablet   Oral   Take 150 mg by mouth daily as needed for heartburn.          . Simethicone (GAS-X EXTRA STRENGTH) 125 MG CAPS   Oral  Take 1 capsule by mouth daily as needed (for relief).         . Tapentadol HCl (NUCYNTA ER) 100 MG TB12   Oral   Take 100 mg by mouth at bedtime as needed (for  pain/sleep).          . traZODone (DESYREL) 150 MG tablet   Oral   Take 150 mg by mouth at bedtime.         Marland Kitchen warfarin (COUMADIN) 1 MG tablet   Oral   Take 1 mg by mouth as directed. 2 tabs Mon-Fri and 4 tabs on Sat and Sun         . albuterol (PROVENTIL) (2.5 MG/3ML) 0.083% nebulizer solution   Nebulization   Take 2.5 mg by nebulization every 6 (six) hours as needed for wheezing.           BP 142/77  Pulse 75  Temp(Src) 98.3 F (36.8 C) (Oral)  Resp 16  Ht 5\' 5"  (1.651 m)  Wt 150 lb (68.04 kg)  BMI 24.96 kg/m2  SpO2 97%  Physical Exam  Constitutional: She is oriented to person, place, and time. She appears well-developed and well-nourished.  HENT:  Head: Normocephalic and atraumatic.  Tender to right temporal area.   Eyes: Pupils are equal, round, and reactive to light.  Neck: Neck supple.  Cardiovascular: Normal rate.   Pulmonary/Chest: Effort normal.  Abdominal: Soft. Bowel sounds are normal.  Musculoskeletal: Normal range of motion.  Neurological: She is alert and oriented to person, place, and time.  Skin: Skin is warm and dry.    ED Course  Procedures (including critical care time)  Labs Reviewed  BASIC METABOLIC PANEL - Abnormal; Notable for the following:    Glucose, Bld 106 (*)    GFR calc non Af Amer 68 (*)    GFR calc Af Amer 79 (*)    All other components within normal limits  PROTIME-INR - Abnormal; Notable for the following:    Prothrombin Time 25.9 (*)    INR 2.51 (*)    All other components within normal limits  SEDIMENTATION RATE   Ct Head Wo Contrast  04/29/2012  *RADIOLOGY REPORT*  Clinical Data: Headache and face pressure and dizziness since trauma 5 days ago.  CT HEAD WITHOUT CONTRAST  Technique:  Contiguous axial images were obtained from the base of the skull through the vertex without contrast.  Comparison: None.  Findings: There is no acute intracranial hemorrhage, infarction, or mass lesion.  Brain parenchyma is normal.   Osseous structures are normal.  IMPRESSION: Normal exam.   Original Report Authenticated By: Francene Boyers, M.D.      1. Headache   2. Dizziness       MDM  Patient with headache and lightheadedness after getting hit in the head. Head CT is negative. Due to various complaints and previous vasculitis sedimentation rate was done and was normal. Patient is well-appearing and will be discharged home. She has a nonfocal examination. She'll followup with her primary care Dr.        Juliet Rude. Rubin Payor, MD 04/29/12 2112

## 2012-04-29 NOTE — ED Notes (Signed)
Pt reports last Friday night she was bending down to hook the leash to her dog.  Says the dog accidentally hit her in the face with his head and pushed her glasses into her eye.  Pt says since then her r eye hasn't "felt right" and is having pain in r side of head.  Also c/o nausea and dizziness.  Pt says is on coumadin for afib.  Reports had tick bite approx 1 week ago as well.

## 2012-04-29 NOTE — Progress Notes (Signed)
Pt was struck in the R eye/R side of head by her dog 5 nights ago.  Has had increasing head and facial pressure on the right side since then and c/o dizziness today.  She is on Coumadin.  Patient drove herself to our office.    CT scan most likely needed.  ED visit appropriate.  Patient can't drive herself and she refused EMS transport due to cost.  Patient was able to contact a family member who will transport her to [facility] for ED evaluation.

## 2012-04-29 NOTE — ED Notes (Signed)
Visual acuity  R-20/13 Left 20/15

## 2012-04-29 NOTE — Patient Instructions (Addendum)
Go to [facility] F/u PRN

## 2012-05-29 DIAGNOSIS — Z7901 Long term (current) use of anticoagulants: Secondary | ICD-10-CM | POA: Diagnosis not present

## 2012-06-23 DIAGNOSIS — F4322 Adjustment disorder with anxiety: Secondary | ICD-10-CM | POA: Diagnosis not present

## 2012-06-30 DIAGNOSIS — Z7901 Long term (current) use of anticoagulants: Secondary | ICD-10-CM | POA: Diagnosis not present

## 2012-07-14 DIAGNOSIS — F4322 Adjustment disorder with anxiety: Secondary | ICD-10-CM | POA: Diagnosis not present

## 2012-07-20 DIAGNOSIS — Z7901 Long term (current) use of anticoagulants: Secondary | ICD-10-CM | POA: Diagnosis not present

## 2012-07-27 DIAGNOSIS — F4322 Adjustment disorder with anxiety: Secondary | ICD-10-CM | POA: Diagnosis not present

## 2012-08-10 DIAGNOSIS — R928 Other abnormal and inconclusive findings on diagnostic imaging of breast: Secondary | ICD-10-CM | POA: Diagnosis not present

## 2012-08-10 DIAGNOSIS — Z803 Family history of malignant neoplasm of breast: Secondary | ICD-10-CM | POA: Diagnosis not present

## 2012-08-11 DIAGNOSIS — J449 Chronic obstructive pulmonary disease, unspecified: Secondary | ICD-10-CM | POA: Diagnosis not present

## 2012-08-11 DIAGNOSIS — R0989 Other specified symptoms and signs involving the circulatory and respiratory systems: Secondary | ICD-10-CM | POA: Diagnosis not present

## 2012-08-11 DIAGNOSIS — F4322 Adjustment disorder with anxiety: Secondary | ICD-10-CM | POA: Diagnosis not present

## 2012-08-11 DIAGNOSIS — R0609 Other forms of dyspnea: Secondary | ICD-10-CM | POA: Diagnosis not present

## 2012-08-11 DIAGNOSIS — R05 Cough: Secondary | ICD-10-CM | POA: Diagnosis not present

## 2012-08-14 DIAGNOSIS — B079 Viral wart, unspecified: Secondary | ICD-10-CM | POA: Diagnosis not present

## 2012-08-14 DIAGNOSIS — L570 Actinic keratosis: Secondary | ICD-10-CM | POA: Diagnosis not present

## 2012-08-14 DIAGNOSIS — L089 Local infection of the skin and subcutaneous tissue, unspecified: Secondary | ICD-10-CM | POA: Diagnosis not present

## 2012-08-17 DIAGNOSIS — Z961 Presence of intraocular lens: Secondary | ICD-10-CM | POA: Diagnosis not present

## 2012-08-17 DIAGNOSIS — H43399 Other vitreous opacities, unspecified eye: Secondary | ICD-10-CM | POA: Diagnosis not present

## 2012-08-17 DIAGNOSIS — H26499 Other secondary cataract, unspecified eye: Secondary | ICD-10-CM | POA: Diagnosis not present

## 2012-08-17 DIAGNOSIS — H04129 Dry eye syndrome of unspecified lacrimal gland: Secondary | ICD-10-CM | POA: Diagnosis not present

## 2012-08-17 DIAGNOSIS — H1130 Conjunctival hemorrhage, unspecified eye: Secondary | ICD-10-CM | POA: Diagnosis not present

## 2012-08-18 DIAGNOSIS — R6889 Other general symptoms and signs: Secondary | ICD-10-CM | POA: Diagnosis not present

## 2012-08-18 DIAGNOSIS — I4891 Unspecified atrial fibrillation: Secondary | ICD-10-CM | POA: Diagnosis not present

## 2012-08-18 DIAGNOSIS — IMO0001 Reserved for inherently not codable concepts without codable children: Secondary | ICD-10-CM | POA: Diagnosis not present

## 2012-08-18 DIAGNOSIS — M3500 Sicca syndrome, unspecified: Secondary | ICD-10-CM | POA: Diagnosis not present

## 2012-08-18 DIAGNOSIS — M130 Polyarthritis, unspecified: Secondary | ICD-10-CM | POA: Diagnosis not present

## 2012-08-18 DIAGNOSIS — M19029 Primary osteoarthritis, unspecified elbow: Secondary | ICD-10-CM | POA: Diagnosis not present

## 2012-08-18 DIAGNOSIS — M5030 Other cervical disc degeneration, unspecified cervical region: Secondary | ICD-10-CM | POA: Diagnosis not present

## 2012-08-18 DIAGNOSIS — Z86010 Personal history of colonic polyps: Secondary | ICD-10-CM | POA: Diagnosis not present

## 2012-08-18 DIAGNOSIS — K219 Gastro-esophageal reflux disease without esophagitis: Secondary | ICD-10-CM | POA: Diagnosis not present

## 2012-08-18 DIAGNOSIS — M25549 Pain in joints of unspecified hand: Secondary | ICD-10-CM | POA: Diagnosis not present

## 2012-08-21 DIAGNOSIS — R609 Edema, unspecified: Secondary | ICD-10-CM | POA: Diagnosis not present

## 2012-08-21 DIAGNOSIS — J45909 Unspecified asthma, uncomplicated: Secondary | ICD-10-CM | POA: Diagnosis not present

## 2012-08-21 DIAGNOSIS — IMO0001 Reserved for inherently not codable concepts without codable children: Secondary | ICD-10-CM | POA: Diagnosis not present

## 2012-08-21 DIAGNOSIS — M129 Arthropathy, unspecified: Secondary | ICD-10-CM | POA: Diagnosis not present

## 2012-08-21 DIAGNOSIS — M6281 Muscle weakness (generalized): Secondary | ICD-10-CM | POA: Diagnosis not present

## 2012-08-21 DIAGNOSIS — R5383 Other fatigue: Secondary | ICD-10-CM | POA: Diagnosis not present

## 2012-08-21 DIAGNOSIS — M542 Cervicalgia: Secondary | ICD-10-CM | POA: Diagnosis not present

## 2012-08-21 DIAGNOSIS — R5381 Other malaise: Secondary | ICD-10-CM | POA: Diagnosis not present

## 2012-08-24 DIAGNOSIS — F4322 Adjustment disorder with anxiety: Secondary | ICD-10-CM | POA: Diagnosis not present

## 2012-08-26 DIAGNOSIS — E7800 Pure hypercholesterolemia, unspecified: Secondary | ICD-10-CM | POA: Diagnosis not present

## 2012-08-26 DIAGNOSIS — I4891 Unspecified atrial fibrillation: Secondary | ICD-10-CM | POA: Diagnosis not present

## 2012-08-28 DIAGNOSIS — M75100 Unspecified rotator cuff tear or rupture of unspecified shoulder, not specified as traumatic: Secondary | ICD-10-CM | POA: Diagnosis not present

## 2012-09-01 DIAGNOSIS — F4322 Adjustment disorder with anxiety: Secondary | ICD-10-CM | POA: Diagnosis not present

## 2012-09-03 DIAGNOSIS — H26499 Other secondary cataract, unspecified eye: Secondary | ICD-10-CM | POA: Diagnosis not present

## 2012-09-03 DIAGNOSIS — Z961 Presence of intraocular lens: Secondary | ICD-10-CM | POA: Diagnosis not present

## 2012-09-15 DIAGNOSIS — F4322 Adjustment disorder with anxiety: Secondary | ICD-10-CM | POA: Diagnosis not present

## 2012-09-16 DIAGNOSIS — H5200 Hypermetropia, unspecified eye: Secondary | ICD-10-CM | POA: Diagnosis not present

## 2012-09-16 DIAGNOSIS — H04129 Dry eye syndrome of unspecified lacrimal gland: Secondary | ICD-10-CM | POA: Diagnosis not present

## 2012-09-16 DIAGNOSIS — Z961 Presence of intraocular lens: Secondary | ICD-10-CM | POA: Diagnosis not present

## 2012-09-16 DIAGNOSIS — H52229 Regular astigmatism, unspecified eye: Secondary | ICD-10-CM | POA: Diagnosis not present

## 2012-09-18 DIAGNOSIS — R6889 Other general symptoms and signs: Secondary | ICD-10-CM | POA: Diagnosis not present

## 2012-09-18 DIAGNOSIS — D649 Anemia, unspecified: Secondary | ICD-10-CM | POA: Diagnosis not present

## 2012-09-18 DIAGNOSIS — I4891 Unspecified atrial fibrillation: Secondary | ICD-10-CM | POA: Diagnosis not present

## 2012-09-25 DIAGNOSIS — M3500 Sicca syndrome, unspecified: Secondary | ICD-10-CM | POA: Diagnosis not present

## 2012-09-25 DIAGNOSIS — F329 Major depressive disorder, single episode, unspecified: Secondary | ICD-10-CM | POA: Diagnosis not present

## 2012-09-25 DIAGNOSIS — G589 Mononeuropathy, unspecified: Secondary | ICD-10-CM | POA: Diagnosis not present

## 2012-09-25 DIAGNOSIS — Z1211 Encounter for screening for malignant neoplasm of colon: Secondary | ICD-10-CM | POA: Diagnosis not present

## 2012-09-25 DIAGNOSIS — K5730 Diverticulosis of large intestine without perforation or abscess without bleeding: Secondary | ICD-10-CM | POA: Diagnosis not present

## 2012-09-25 DIAGNOSIS — Z888 Allergy status to other drugs, medicaments and biological substances status: Secondary | ICD-10-CM | POA: Diagnosis not present

## 2012-09-25 DIAGNOSIS — J449 Chronic obstructive pulmonary disease, unspecified: Secondary | ICD-10-CM | POA: Diagnosis not present

## 2012-09-25 DIAGNOSIS — Z885 Allergy status to narcotic agent status: Secondary | ICD-10-CM | POA: Diagnosis not present

## 2012-09-25 DIAGNOSIS — M1990 Unspecified osteoarthritis, unspecified site: Secondary | ICD-10-CM | POA: Diagnosis not present

## 2012-09-25 DIAGNOSIS — Z860100 Personal history of colon polyps, unspecified: Secondary | ICD-10-CM | POA: Diagnosis not present

## 2012-09-25 DIAGNOSIS — E785 Hyperlipidemia, unspecified: Secondary | ICD-10-CM | POA: Diagnosis not present

## 2012-09-25 DIAGNOSIS — Z882 Allergy status to sulfonamides status: Secondary | ICD-10-CM | POA: Diagnosis not present

## 2012-09-25 DIAGNOSIS — L719 Rosacea, unspecified: Secondary | ICD-10-CM | POA: Diagnosis not present

## 2012-09-25 DIAGNOSIS — Z880 Allergy status to penicillin: Secondary | ICD-10-CM | POA: Diagnosis not present

## 2012-09-25 DIAGNOSIS — Z883 Allergy status to other anti-infective agents status: Secondary | ICD-10-CM | POA: Diagnosis not present

## 2012-09-25 DIAGNOSIS — F411 Generalized anxiety disorder: Secondary | ICD-10-CM | POA: Diagnosis not present

## 2012-09-25 DIAGNOSIS — E039 Hypothyroidism, unspecified: Secondary | ICD-10-CM | POA: Diagnosis not present

## 2012-09-25 DIAGNOSIS — I10 Essential (primary) hypertension: Secondary | ICD-10-CM | POA: Diagnosis not present

## 2012-09-25 DIAGNOSIS — Z79899 Other long term (current) drug therapy: Secondary | ICD-10-CM | POA: Diagnosis not present

## 2012-09-25 DIAGNOSIS — K219 Gastro-esophageal reflux disease without esophagitis: Secondary | ICD-10-CM | POA: Diagnosis not present

## 2012-09-25 DIAGNOSIS — D126 Benign neoplasm of colon, unspecified: Secondary | ICD-10-CM | POA: Diagnosis not present

## 2012-09-29 DIAGNOSIS — L02419 Cutaneous abscess of limb, unspecified: Secondary | ICD-10-CM | POA: Diagnosis not present

## 2012-09-29 DIAGNOSIS — J449 Chronic obstructive pulmonary disease, unspecified: Secondary | ICD-10-CM | POA: Diagnosis not present

## 2012-09-29 DIAGNOSIS — R5381 Other malaise: Secondary | ICD-10-CM | POA: Diagnosis not present

## 2012-09-29 DIAGNOSIS — J441 Chronic obstructive pulmonary disease with (acute) exacerbation: Secondary | ICD-10-CM | POA: Diagnosis not present

## 2012-09-29 DIAGNOSIS — L278 Dermatitis due to other substances taken internally: Secondary | ICD-10-CM | POA: Diagnosis not present

## 2012-09-29 DIAGNOSIS — D649 Anemia, unspecified: Secondary | ICD-10-CM | POA: Diagnosis not present

## 2012-09-29 DIAGNOSIS — R509 Fever, unspecified: Secondary | ICD-10-CM | POA: Diagnosis not present

## 2012-09-29 DIAGNOSIS — IMO0001 Reserved for inherently not codable concepts without codable children: Secondary | ICD-10-CM | POA: Diagnosis not present

## 2012-09-29 DIAGNOSIS — K219 Gastro-esophageal reflux disease without esophagitis: Secondary | ICD-10-CM | POA: Diagnosis not present

## 2012-09-29 DIAGNOSIS — M130 Polyarthritis, unspecified: Secondary | ICD-10-CM | POA: Diagnosis not present

## 2012-09-29 DIAGNOSIS — S90569A Insect bite (nonvenomous), unspecified ankle, initial encounter: Secondary | ICD-10-CM | POA: Diagnosis not present

## 2012-09-29 DIAGNOSIS — F329 Major depressive disorder, single episode, unspecified: Secondary | ICD-10-CM | POA: Diagnosis not present

## 2012-09-29 DIAGNOSIS — G8929 Other chronic pain: Secondary | ICD-10-CM | POA: Diagnosis not present

## 2012-09-29 DIAGNOSIS — S0990XA Unspecified injury of head, initial encounter: Secondary | ICD-10-CM | POA: Diagnosis not present

## 2012-09-29 DIAGNOSIS — H538 Other visual disturbances: Secondary | ICD-10-CM | POA: Diagnosis not present

## 2012-09-29 DIAGNOSIS — M129 Arthropathy, unspecified: Secondary | ICD-10-CM | POA: Diagnosis not present

## 2012-09-30 DIAGNOSIS — H1130 Conjunctival hemorrhage, unspecified eye: Secondary | ICD-10-CM | POA: Diagnosis not present

## 2012-09-30 DIAGNOSIS — H04129 Dry eye syndrome of unspecified lacrimal gland: Secondary | ICD-10-CM | POA: Diagnosis not present

## 2012-10-02 DIAGNOSIS — H538 Other visual disturbances: Secondary | ICD-10-CM | POA: Diagnosis not present

## 2012-10-02 DIAGNOSIS — R6889 Other general symptoms and signs: Secondary | ICD-10-CM | POA: Diagnosis not present

## 2012-10-02 DIAGNOSIS — G43809 Other migraine, not intractable, without status migrainosus: Secondary | ICD-10-CM | POA: Diagnosis not present

## 2012-10-02 DIAGNOSIS — H479 Unspecified disorder of visual pathways: Secondary | ICD-10-CM | POA: Diagnosis not present

## 2012-10-02 DIAGNOSIS — I4891 Unspecified atrial fibrillation: Secondary | ICD-10-CM | POA: Diagnosis not present

## 2012-10-02 DIAGNOSIS — H04129 Dry eye syndrome of unspecified lacrimal gland: Secondary | ICD-10-CM | POA: Diagnosis not present

## 2012-10-05 DIAGNOSIS — F4322 Adjustment disorder with anxiety: Secondary | ICD-10-CM | POA: Diagnosis not present

## 2012-10-07 DIAGNOSIS — M899 Disorder of bone, unspecified: Secondary | ICD-10-CM | POA: Diagnosis not present

## 2012-10-12 DIAGNOSIS — R5381 Other malaise: Secondary | ICD-10-CM | POA: Diagnosis not present

## 2012-10-12 DIAGNOSIS — E559 Vitamin D deficiency, unspecified: Secondary | ICD-10-CM | POA: Diagnosis not present

## 2012-10-12 DIAGNOSIS — R079 Chest pain, unspecified: Secondary | ICD-10-CM | POA: Diagnosis not present

## 2012-10-12 DIAGNOSIS — IMO0001 Reserved for inherently not codable concepts without codable children: Secondary | ICD-10-CM | POA: Diagnosis not present

## 2012-10-12 DIAGNOSIS — R109 Unspecified abdominal pain: Secondary | ICD-10-CM | POA: Diagnosis not present

## 2012-10-13 DIAGNOSIS — F4322 Adjustment disorder with anxiety: Secondary | ICD-10-CM | POA: Diagnosis not present

## 2012-10-14 DIAGNOSIS — H04129 Dry eye syndrome of unspecified lacrimal gland: Secondary | ICD-10-CM | POA: Diagnosis not present

## 2012-10-14 DIAGNOSIS — H1030 Unspecified acute conjunctivitis, unspecified eye: Secondary | ICD-10-CM | POA: Diagnosis not present

## 2012-10-19 DIAGNOSIS — J449 Chronic obstructive pulmonary disease, unspecified: Secondary | ICD-10-CM | POA: Diagnosis not present

## 2012-10-19 DIAGNOSIS — R6889 Other general symptoms and signs: Secondary | ICD-10-CM | POA: Diagnosis not present

## 2012-10-20 DIAGNOSIS — M130 Polyarthritis, unspecified: Secondary | ICD-10-CM | POA: Diagnosis not present

## 2012-10-20 DIAGNOSIS — M3500 Sicca syndrome, unspecified: Secondary | ICD-10-CM | POA: Diagnosis not present

## 2012-10-20 DIAGNOSIS — IMO0001 Reserved for inherently not codable concepts without codable children: Secondary | ICD-10-CM | POA: Diagnosis not present

## 2012-10-20 DIAGNOSIS — I7300 Raynaud's syndrome without gangrene: Secondary | ICD-10-CM | POA: Diagnosis not present

## 2012-10-22 DIAGNOSIS — F3289 Other specified depressive episodes: Secondary | ICD-10-CM | POA: Diagnosis not present

## 2012-10-22 DIAGNOSIS — I4891 Unspecified atrial fibrillation: Secondary | ICD-10-CM | POA: Diagnosis not present

## 2012-10-22 DIAGNOSIS — IMO0001 Reserved for inherently not codable concepts without codable children: Secondary | ICD-10-CM | POA: Diagnosis not present

## 2012-10-22 DIAGNOSIS — F329 Major depressive disorder, single episode, unspecified: Secondary | ICD-10-CM | POA: Diagnosis not present

## 2012-10-22 DIAGNOSIS — R5381 Other malaise: Secondary | ICD-10-CM | POA: Diagnosis not present

## 2012-10-22 DIAGNOSIS — M129 Arthropathy, unspecified: Secondary | ICD-10-CM | POA: Diagnosis not present

## 2012-10-22 DIAGNOSIS — G8929 Other chronic pain: Secondary | ICD-10-CM | POA: Diagnosis not present

## 2012-10-22 DIAGNOSIS — I10 Essential (primary) hypertension: Secondary | ICD-10-CM | POA: Diagnosis not present

## 2012-10-22 DIAGNOSIS — N390 Urinary tract infection, site not specified: Secondary | ICD-10-CM | POA: Diagnosis not present

## 2012-10-22 DIAGNOSIS — Z23 Encounter for immunization: Secondary | ICD-10-CM | POA: Diagnosis not present

## 2012-10-28 DIAGNOSIS — F4322 Adjustment disorder with anxiety: Secondary | ICD-10-CM | POA: Diagnosis not present

## 2012-10-28 DIAGNOSIS — N281 Cyst of kidney, acquired: Secondary | ICD-10-CM | POA: Diagnosis not present

## 2012-10-29 DIAGNOSIS — M3500 Sicca syndrome, unspecified: Secondary | ICD-10-CM | POA: Diagnosis not present

## 2012-10-29 DIAGNOSIS — H00029 Hordeolum internum unspecified eye, unspecified eyelid: Secondary | ICD-10-CM | POA: Diagnosis not present

## 2012-10-29 DIAGNOSIS — H04129 Dry eye syndrome of unspecified lacrimal gland: Secondary | ICD-10-CM | POA: Diagnosis not present

## 2012-11-03 DIAGNOSIS — M129 Arthropathy, unspecified: Secondary | ICD-10-CM | POA: Diagnosis not present

## 2012-11-03 DIAGNOSIS — F329 Major depressive disorder, single episode, unspecified: Secondary | ICD-10-CM | POA: Diagnosis not present

## 2012-11-03 DIAGNOSIS — I10 Essential (primary) hypertension: Secondary | ICD-10-CM | POA: Diagnosis not present

## 2012-11-03 DIAGNOSIS — G4700 Insomnia, unspecified: Secondary | ICD-10-CM | POA: Diagnosis not present

## 2012-11-03 DIAGNOSIS — R5381 Other malaise: Secondary | ICD-10-CM | POA: Diagnosis not present

## 2012-11-03 DIAGNOSIS — IMO0001 Reserved for inherently not codable concepts without codable children: Secondary | ICD-10-CM | POA: Diagnosis not present

## 2012-11-03 DIAGNOSIS — R4589 Other symptoms and signs involving emotional state: Secondary | ICD-10-CM | POA: Diagnosis not present

## 2012-11-03 DIAGNOSIS — R52 Pain, unspecified: Secondary | ICD-10-CM | POA: Diagnosis not present

## 2012-11-05 DIAGNOSIS — IMO0001 Reserved for inherently not codable concepts without codable children: Secondary | ICD-10-CM | POA: Diagnosis not present

## 2012-11-05 DIAGNOSIS — G4700 Insomnia, unspecified: Secondary | ICD-10-CM | POA: Diagnosis not present

## 2012-11-05 DIAGNOSIS — F329 Major depressive disorder, single episode, unspecified: Secondary | ICD-10-CM | POA: Diagnosis not present

## 2012-11-05 DIAGNOSIS — R5381 Other malaise: Secondary | ICD-10-CM | POA: Diagnosis not present

## 2012-11-05 DIAGNOSIS — M129 Arthropathy, unspecified: Secondary | ICD-10-CM | POA: Diagnosis not present

## 2012-11-05 DIAGNOSIS — R4589 Other symptoms and signs involving emotional state: Secondary | ICD-10-CM | POA: Diagnosis not present

## 2012-11-05 DIAGNOSIS — I10 Essential (primary) hypertension: Secondary | ICD-10-CM | POA: Diagnosis not present

## 2012-11-10 DIAGNOSIS — F4322 Adjustment disorder with anxiety: Secondary | ICD-10-CM | POA: Diagnosis not present

## 2012-12-03 DIAGNOSIS — Z7901 Long term (current) use of anticoagulants: Secondary | ICD-10-CM | POA: Diagnosis not present

## 2013-02-01 DIAGNOSIS — F4322 Adjustment disorder with anxiety: Secondary | ICD-10-CM | POA: Diagnosis not present

## 2013-02-03 DIAGNOSIS — L739 Follicular disorder, unspecified: Secondary | ICD-10-CM | POA: Diagnosis not present

## 2013-02-03 DIAGNOSIS — L03039 Cellulitis of unspecified toe: Secondary | ICD-10-CM | POA: Diagnosis not present

## 2013-02-03 DIAGNOSIS — I789 Disease of capillaries, unspecified: Secondary | ICD-10-CM | POA: Diagnosis not present

## 2013-02-03 DIAGNOSIS — D045 Carcinoma in situ of skin of trunk: Secondary | ICD-10-CM | POA: Diagnosis not present

## 2013-02-03 DIAGNOSIS — Z85828 Personal history of other malignant neoplasm of skin: Secondary | ICD-10-CM | POA: Diagnosis not present

## 2013-02-03 DIAGNOSIS — L570 Actinic keratosis: Secondary | ICD-10-CM | POA: Diagnosis not present

## 2013-02-03 DIAGNOSIS — D485 Neoplasm of uncertain behavior of skin: Secondary | ICD-10-CM | POA: Diagnosis not present

## 2013-02-03 DIAGNOSIS — D2370 Other benign neoplasm of skin of unspecified lower limb, including hip: Secondary | ICD-10-CM | POA: Diagnosis not present

## 2013-02-03 DIAGNOSIS — L820 Inflamed seborrheic keratosis: Secondary | ICD-10-CM | POA: Diagnosis not present

## 2013-02-03 DIAGNOSIS — L821 Other seborrheic keratosis: Secondary | ICD-10-CM | POA: Diagnosis not present

## 2013-02-15 DIAGNOSIS — Z7901 Long term (current) use of anticoagulants: Secondary | ICD-10-CM | POA: Diagnosis not present

## 2013-03-16 DIAGNOSIS — Z7901 Long term (current) use of anticoagulants: Secondary | ICD-10-CM | POA: Diagnosis not present

## 2013-04-20 DIAGNOSIS — I4891 Unspecified atrial fibrillation: Secondary | ICD-10-CM | POA: Diagnosis not present

## 2013-04-20 DIAGNOSIS — Z7901 Long term (current) use of anticoagulants: Secondary | ICD-10-CM | POA: Diagnosis not present

## 2013-05-26 DIAGNOSIS — I4891 Unspecified atrial fibrillation: Secondary | ICD-10-CM | POA: Diagnosis not present

## 2013-06-01 DIAGNOSIS — F4322 Adjustment disorder with anxiety: Secondary | ICD-10-CM | POA: Diagnosis not present

## 2013-07-01 DIAGNOSIS — I4891 Unspecified atrial fibrillation: Secondary | ICD-10-CM | POA: Diagnosis not present

## 2013-07-27 ENCOUNTER — Encounter: Payer: Self-pay | Admitting: Family

## 2013-07-27 ENCOUNTER — Telehealth: Payer: Self-pay | Admitting: Family Medicine

## 2013-07-27 ENCOUNTER — Ambulatory Visit (INDEPENDENT_AMBULATORY_CARE_PROVIDER_SITE_OTHER): Payer: Medicare Other | Admitting: Family

## 2013-07-27 ENCOUNTER — Ambulatory Visit (INDEPENDENT_AMBULATORY_CARE_PROVIDER_SITE_OTHER): Payer: Medicare Other

## 2013-07-27 VITALS — BP 126/81 | HR 74 | Temp 97.6°F | Ht 65.0 in | Wt 146.2 lb

## 2013-07-27 DIAGNOSIS — I4891 Unspecified atrial fibrillation: Secondary | ICD-10-CM

## 2013-07-27 DIAGNOSIS — J069 Acute upper respiratory infection, unspecified: Secondary | ICD-10-CM

## 2013-07-27 DIAGNOSIS — R042 Hemoptysis: Secondary | ICD-10-CM | POA: Diagnosis not present

## 2013-07-27 DIAGNOSIS — I480 Paroxysmal atrial fibrillation: Secondary | ICD-10-CM

## 2013-07-27 DIAGNOSIS — I4821 Permanent atrial fibrillation: Secondary | ICD-10-CM | POA: Insufficient documentation

## 2013-07-27 DIAGNOSIS — Z7901 Long term (current) use of anticoagulants: Secondary | ICD-10-CM | POA: Diagnosis not present

## 2013-07-27 LAB — POCT CBC
GRANULOCYTE PERCENT: 80 % (ref 37–80)
HEMATOCRIT: 47.9 % (ref 37.7–47.9)
Hemoglobin: 15.4 g/dL (ref 12.2–16.2)
Lymph, poc: 1.4 (ref 0.6–3.4)
MCH: 29.2 pg (ref 27–31.2)
MCHC: 32.2 g/dL (ref 31.8–35.4)
MCV: 90.5 fL (ref 80–97)
MPV: 9.4 fL (ref 0–99.8)
PLATELET COUNT, POC: 152 10*3/uL (ref 142–424)
POC GRANULOCYTE: 6.2 (ref 2–6.9)
POC LYMPH %: 17.5 % (ref 10–50)
RBC: 5.3 M/uL (ref 4.04–5.48)
RDW, POC: 13.6 %
WBC: 7.8 10*3/uL (ref 4.6–10.2)

## 2013-07-27 LAB — POCT INR: INR: 5.5

## 2013-07-27 MED ORDER — BENZONATATE 200 MG PO CAPS: 200.0000 mg | ORAL_CAPSULE | Freq: Three times a day (TID) | ORAL | Status: DC | PRN

## 2013-07-27 MED ORDER — PHYTONADIONE 5 MG PO TABS
2.5000 mg | ORAL_TABLET | Freq: Once | ORAL | Status: AC
  Administered 2013-07-27: 2.5 mg via ORAL

## 2013-07-27 NOTE — Telephone Encounter (Signed)
appt given for 11 with christy

## 2013-07-27 NOTE — Patient Instructions (Addendum)
Upper Respiratory Infection, Adult An upper respiratory infection (URI) is also sometimes known as the common cold. The upper respiratory tract includes the nose, sinuses, throat, trachea, and bronchi. Bronchi are the airways leading to the lungs. Most people improve within 1 week, but symptoms can last up to 2 weeks. A residual cough may last even longer.  CAUSES Many different viruses can infect the tissues lining the upper respiratory tract. The tissues become irritated and inflamed and often become very moist. Mucus production is also common. A cold is contagious. You can easily spread the virus to others by oral contact. This includes kissing, sharing a glass, coughing, or sneezing. Touching your mouth or nose and then touching a surface, which is then touched by another person, can also spread the virus. SYMPTOMS  Symptoms typically develop 1 to 3 days after you come in contact with a cold virus. Symptoms vary from person to person. They may include:  Runny nose.  Sneezing.  Nasal congestion.  Sinus irritation.  Sore throat.  Loss of voice (laryngitis).  Cough.  Fatigue.  Muscle aches.  Loss of appetite.  Headache.  Low-grade fever. DIAGNOSIS  You might diagnose your own cold based on familiar symptoms, since most people get a cold 2 to 3 times a year. Your caregiver can confirm this based on your exam. Most importantly, your caregiver can check that your symptoms are not due to another disease such as strep throat, sinusitis, pneumonia, asthma, or epiglottitis. Blood tests, throat tests, and X-rays are not necessary to diagnose a common cold, but they may sometimes be helpful in excluding other more serious diseases. Your caregiver will decide if any further tests are required. RISKS AND COMPLICATIONS  You may be at risk for a more severe case of the common cold if you smoke cigarettes, have chronic heart disease (such as heart failure) or lung disease (such as asthma), or if  you have a weakened immune system. The very young and very old are also at risk for more serious infections. Bacterial sinusitis, middle ear infections, and bacterial pneumonia can complicate the common cold. The common cold can worsen asthma and chronic obstructive pulmonary disease (COPD). Sometimes, these complications can require emergency medical care and may be life-threatening. PREVENTION  The best way to protect against getting a cold is to practice good hygiene. Avoid oral or hand contact with people with cold symptoms. Wash your hands often if contact occurs. There is no clear evidence that vitamin C, vitamin E, echinacea, or exercise reduces the chance of developing a cold. However, it is always recommended to get plenty of rest and practice good nutrition. TREATMENT  Treatment is directed at relieving symptoms. There is no cure. Antibiotics are not effective, because the infection is caused by a virus, not by bacteria. Treatment may include:  Increased fluid intake. Sports drinks offer valuable electrolytes, sugars, and fluids.  Breathing heated mist or steam (vaporizer or shower).  Eating chicken soup or other clear broths, and maintaining good nutrition.  Getting plenty of rest.  Using gargles or lozenges for comfort.  Controlling fevers with ibuprofen or acetaminophen as directed by your caregiver.  Increasing usage of your inhaler if you have asthma. Zinc gel and zinc lozenges, taken in the first 24 hours of the common cold, can shorten the duration and lessen the severity of symptoms. Pain medicines may help with fever, muscle aches, and throat pain. A variety of non-prescription medicines are available to treat congestion and runny nose. Your caregiver   can make recommendations and may suggest nasal or lung inhalers for other symptoms.  HOME CARE INSTRUCTIONS   Only take over-the-counter or prescription medicines for pain, discomfort, or fever as directed by your  caregiver.  Use a warm mist humidifier or inhale steam from a shower to increase air moisture. This may keep secretions moist and make it easier to breathe.  Drink enough water and fluids to keep your urine clear or pale yellow.  Rest as needed.  Return to work when your temperature has returned to normal or as your caregiver advises. You may need to stay home longer to avoid infecting others. You can also use a face mask and careful hand washing to prevent spread of the virus. SEEK MEDICAL CARE IF:   After the first few days, you feel you are getting worse rather than better.  You need your caregiver's advice about medicines to control symptoms.  You develop chills, worsening shortness of breath, or brown or red sputum. These may be signs of pneumonia.  You develop yellow or brown nasal discharge or pain in the face, especially when you bend forward. These may be signs of sinusitis.  You develop a fever, swollen neck glands, pain with swallowing, or white areas in the back of your throat. These may be signs of strep throat. SEEK IMMEDIATE MEDICAL CARE IF:   You have a fever.  You develop severe or persistent headache, ear pain, sinus pain, or chest pain.  You develop wheezing, a prolonged cough, cough up blood, or have a change in your usual mucus (if you have chronic lung disease).  You develop sore muscles or a stiff neck. Document Released: 06/26/2000 Document Revised: 03/25/2011 Document Reviewed: 05/04/2010 [facility] Patient Information 2015 [place], [place]. This information is not intended to replace advice given to you by your health care provider. Make sure you discuss any questions you have with your health care provider.  - Take meds as prescribed - Use a cool mist humidifier  -Use saline nose sprays frequently -Saline irrigations of the nose can be very helpful if done frequently.  * 4X daily for 1 week*  * Use of a nettie pot can be helpful with this. Follow  directions with this* -Force fluids -For any cough or congestion  Use plain Mucinex- regular strength or max strength is fine   * Children- consult with Pharmacist for dosing -For fever or aces or pains- take tylenol or ibuprofen appropriate for age and weight.  * for fevers greater than 101 orally you may alternate ibuprofen and tylenol every  3 hours. -Throat lozenges if help -New toothbrush in 3 days   [name], FNP Anticoagulation Dose Instructions as of 07/27/2013     Dorene Grebe Tue Wed Thu Fri Sat   New Dose 4 mg 2 mg Hold Hold 2 mg 2 mg 2 mg   Alt Week 2 mg 2 mg 2 mg 2 mg 2 mg 2 mg 2 mg    Description       No warfarin today (07/27/13) or tomorrow (07/28/13).  Return to clinic Thursday 07/29/13 to check protime

## 2013-07-27 NOTE — Progress Notes (Signed)
Subjective:    Patient ID: Angela Wong, female    DOB: [birth year], 73 y.o.   MRN: [id number]  Cough This is a new problem. The current episode started in the past 7 days. The problem has been gradually worsening. The problem occurs every few minutes. The cough is productive of bloody sputum and productive of purulent sputum. Associated symptoms include chills, ear congestion, a fever, headaches, hemoptysis, postnasal drip and a sore throat. Pertinent negatives include no ear pain, nasal congestion or rhinorrhea. The symptoms are aggravated by exercise and lying down. She has tried rest (zpack) for the symptoms. The treatment provided mild relief. There is no history of COPD or pneumonia.      Review of Systems  Constitutional: Positive for fever and chills.  HENT: Positive for postnasal drip and sore throat. Negative for ear pain and rhinorrhea.   Respiratory: Positive for cough and hemoptysis.   Neurological: Positive for headaches.  All other systems reviewed and are negative.      Objective:   Physical Exam  Vitals reviewed. Constitutional: She is oriented to person, place, and time. She appears well-developed and well-nourished. No distress.  HENT:  Head: Normocephalic and atraumatic.  Right Ear: External ear normal.  Left Ear: External ear normal.  Oropharynx erythemas Nasal passage erythemas with mild swelling   Eyes: Pupils are equal, round, and reactive to light.  Neck: Normal range of motion. Neck supple. No thyromegaly present.  Cardiovascular: Normal rate, regular rhythm, normal heart sounds and intact distal pulses.   No murmur heard. Pulmonary/Chest: Effort normal and breath sounds normal. No respiratory distress. She has no wheezes.  Abdominal: Soft. Bowel sounds are normal. She exhibits no distension. There is no tenderness.  Musculoskeletal: Normal range of motion. She exhibits no edema and no tenderness.  Neurological: She is alert and oriented to person,  place, and time. She has normal reflexes. No cranial nerve deficit.  Skin: Skin is warm and dry.  Psychiatric: She has a normal mood and affect. Her behavior is normal. Judgment and thought content normal.    BP 126/81  Pulse 74  Temp(Src) 97.6 F (36.4 C) (Oral)  Ht 5\' 5"  (1.651 m)  Wt 146 lb 3.2 oz (66.316 kg)  BMI 24.33 kg/m2       Assessment & Plan:  1. Blood-tinged sputu - DG Chest 2 View; Future - POCT INR  2. Acute upper respiratory infections of unspecified site -- Take meds as prescribed - Use a cool mist humidifier  -Use saline nose sprays frequently -Saline irrigations of the nose can be very helpful if done frequently.  * 4X daily for 1 week*  * Use of a nettie pot can be helpful with this. Follow directions with this* -Force fluids -For any cough or congestion  Use plain Mucinex- regular strength or max strength is fine   * Children- consult with Pharmacist for dosing -For fever or aces or pains- take tylenol or ibuprofen appropriate for age and weight.  * for fevers greater than 101 orally you may alternate ibuprofen and tylenol every  3 hours. -Throat lozenges if help -New toothbrush in 3 day Meds ordered this encounter  Medications  . Cyanocobalamin (VITAMIN B 12 PO)    Sig: Take 500 mg by mouth daily.  Marland Kitchen DISCONTD: benzonatate (TESSALON) 200 MG capsule    Sig: Take 1 capsule (200 mg total) by mouth 3 (three) times daily as needed for cough.    Dispense:  20 capsule  Refill:  0    Order Specific Question:  Supervising Provider    Answer:  Chipper Herb [[id number]]  . benzonatate (TESSALON) 200 MG capsule    Sig: Take 1 capsule (200 mg total) by mouth 3 (three) times daily as needed for cough.    Dispense:  30 capsule    Refill:  1    Order Specific Question:  Supervising Provider    Answer:  Chipper Herb [[id number]]    3. Warfarin anticoagulation - POCT INR  [name], FNP

## 2013-07-27 NOTE — Addendum Note (Signed)
Addended by: Earlene Plater on: 07/27/2013 12:37 PM   Modules accepted: Orders

## 2013-07-27 NOTE — Addendum Note (Signed)
Addended by: Cherre Robins on: 07/27/2013 01:14 PM   Modules accepted: Orders

## 2013-07-29 ENCOUNTER — Ambulatory Visit (INDEPENDENT_AMBULATORY_CARE_PROVIDER_SITE_OTHER): Payer: Medicare Other | Admitting: Pharmacist

## 2013-07-29 DIAGNOSIS — Z7901 Long term (current) use of anticoagulants: Secondary | ICD-10-CM

## 2013-07-29 DIAGNOSIS — I4891 Unspecified atrial fibrillation: Secondary | ICD-10-CM

## 2013-07-29 DIAGNOSIS — I480 Paroxysmal atrial fibrillation: Secondary | ICD-10-CM

## 2013-07-29 LAB — POCT INR: INR: 1.5

## 2013-07-29 NOTE — Patient Instructions (Signed)
Anticoagulation Dose Instructions as of 07/29/2013     Dorene Grebe Tue Wed Thu Fri Sat   New Dose 2 mg 2 mg 2 mg 2 mg 2 mg 2 mg 2 mg    Description       Start warfarin 1mg  tablets - take 2 tablets daily      INR was 1.5 today

## 2013-08-05 ENCOUNTER — Ambulatory Visit (INDEPENDENT_AMBULATORY_CARE_PROVIDER_SITE_OTHER): Payer: Medicare Other | Admitting: Pharmacist

## 2013-08-05 DIAGNOSIS — Z7901 Long term (current) use of anticoagulants: Secondary | ICD-10-CM | POA: Diagnosis not present

## 2013-08-05 DIAGNOSIS — I4891 Unspecified atrial fibrillation: Secondary | ICD-10-CM

## 2013-08-05 DIAGNOSIS — I480 Paroxysmal atrial fibrillation: Secondary | ICD-10-CM

## 2013-08-05 LAB — POCT INR: INR: 2.7

## 2013-08-05 NOTE — Patient Instructions (Signed)
Anticoagulation Dose Instructions as of 08/05/2013     Angela Wong Tue Wed Thu Fri Sat   New Dose 1 mg 2 mg 2 mg 2 mg 1 mg 2 mg 2 mg    Description       Change warfarin to 1 tablet on Sundays and Thursdays.  Take 2 tablet on all other days      INR was 2.7 today

## 2013-08-19 ENCOUNTER — Ambulatory Visit (INDEPENDENT_AMBULATORY_CARE_PROVIDER_SITE_OTHER): Payer: Medicare Other | Admitting: Pharmacist

## 2013-08-19 DIAGNOSIS — I480 Paroxysmal atrial fibrillation: Secondary | ICD-10-CM

## 2013-08-19 DIAGNOSIS — I4891 Unspecified atrial fibrillation: Secondary | ICD-10-CM

## 2013-08-19 DIAGNOSIS — Z7901 Long term (current) use of anticoagulants: Secondary | ICD-10-CM | POA: Diagnosis not present

## 2013-08-19 LAB — POCT INR: INR: 1.4

## 2013-08-19 NOTE — Patient Instructions (Signed)
Anticoagulation Dose Instructions as of 08/19/2013     Angela Wong Tue Wed Thu Fri Sat   New Dose 3 mg 2 mg 2 mg 2 mg 3 mg 2 mg 2 mg    Description       Change warfarin to 3 tablet on Sundays and Thursdays.  Take 2 tablet on all other days      INR was 1.4 today

## 2013-08-25 ENCOUNTER — Ambulatory Visit (INDEPENDENT_AMBULATORY_CARE_PROVIDER_SITE_OTHER): Payer: Medicare Other | Admitting: Pharmacist

## 2013-08-25 DIAGNOSIS — I4891 Unspecified atrial fibrillation: Secondary | ICD-10-CM | POA: Diagnosis not present

## 2013-08-25 DIAGNOSIS — Z7901 Long term (current) use of anticoagulants: Secondary | ICD-10-CM

## 2013-08-25 DIAGNOSIS — I480 Paroxysmal atrial fibrillation: Secondary | ICD-10-CM

## 2013-08-25 LAB — POCT INR: INR: 1.7

## 2013-08-25 NOTE — Patient Instructions (Signed)
Anticoagulation Dose Instructions as of 08/25/2013     Angela Wong Tue Wed Thu Fri Sat   New Dose 4 mg 2 mg 2 mg 2 mg 2 mg 2 mg 4 mg    Description       Take 3 tablets today, then restart regular dose of 2mg  Monday through Friday and 4mg  on saturdays and sundays / weekends.      INR was 1.7 today

## 2013-09-01 ENCOUNTER — Ambulatory Visit (INDEPENDENT_AMBULATORY_CARE_PROVIDER_SITE_OTHER): Payer: Medicare Other | Admitting: Nurse Practitioner

## 2013-09-01 DIAGNOSIS — I480 Paroxysmal atrial fibrillation: Secondary | ICD-10-CM

## 2013-09-01 DIAGNOSIS — Z7901 Long term (current) use of anticoagulants: Secondary | ICD-10-CM | POA: Diagnosis not present

## 2013-09-01 DIAGNOSIS — I4891 Unspecified atrial fibrillation: Secondary | ICD-10-CM

## 2013-09-01 LAB — POCT INR: INR: 2

## 2013-09-01 NOTE — Patient Instructions (Signed)
Anticoagulation Dose Instructions as of 09/01/2013     Dorene Grebe Tue Wed Thu Fri Sat   New Dose 4 mg 2 mg 2 mg 2 mg 2 mg 2 mg 4 mg    Description       Take 3 tablets today, then restart regular dose of 2mg  Monday through Friday and 4mg  on saturdays and sundays / weekends.     Recheck in 1 month

## 2013-10-01 ENCOUNTER — Ambulatory Visit (INDEPENDENT_AMBULATORY_CARE_PROVIDER_SITE_OTHER): Payer: Medicare Other | Admitting: Pharmacist

## 2013-10-01 DIAGNOSIS — K219 Gastro-esophageal reflux disease without esophagitis: Secondary | ICD-10-CM | POA: Insufficient documentation

## 2013-10-01 DIAGNOSIS — Z7901 Long term (current) use of anticoagulants: Secondary | ICD-10-CM

## 2013-10-01 DIAGNOSIS — I4891 Unspecified atrial fibrillation: Secondary | ICD-10-CM | POA: Diagnosis not present

## 2013-10-01 DIAGNOSIS — I10 Essential (primary) hypertension: Secondary | ICD-10-CM

## 2013-10-01 DIAGNOSIS — Z8543 Personal history of malignant neoplasm of ovary: Secondary | ICD-10-CM | POA: Insufficient documentation

## 2013-10-01 DIAGNOSIS — Z8041 Family history of malignant neoplasm of ovary: Secondary | ICD-10-CM

## 2013-10-01 DIAGNOSIS — I480 Paroxysmal atrial fibrillation: Secondary | ICD-10-CM

## 2013-10-01 LAB — POCT INR: INR: 2.8

## 2013-10-01 NOTE — Patient Instructions (Signed)
Anticoagulation Dose Instructions as of 10/01/2013     Angela Wong Tue Wed Thu Fri Sat   New Dose 4 mg 2 mg 2 mg 2 mg 2 mg 2 mg 4 mg    Description       Continue regular warfarin dose of 2mg  Monday through Friday and 4mg  on saturdays and sundays / weekends.      INR was 2.8 today

## 2013-10-13 DIAGNOSIS — Z1231 Encounter for screening mammogram for malignant neoplasm of breast: Secondary | ICD-10-CM | POA: Diagnosis not present

## 2013-10-28 DIAGNOSIS — B351 Tinea unguium: Secondary | ICD-10-CM | POA: Diagnosis not present

## 2013-10-28 DIAGNOSIS — D1801 Hemangioma of skin and subcutaneous tissue: Secondary | ICD-10-CM | POA: Diagnosis not present

## 2013-10-28 DIAGNOSIS — D235 Other benign neoplasm of skin of trunk: Secondary | ICD-10-CM | POA: Diagnosis not present

## 2013-10-28 DIAGNOSIS — D2372 Other benign neoplasm of skin of left lower limb, including hip: Secondary | ICD-10-CM | POA: Diagnosis not present

## 2013-10-28 DIAGNOSIS — L304 Erythema intertrigo: Secondary | ICD-10-CM | POA: Diagnosis not present

## 2013-10-28 DIAGNOSIS — L821 Other seborrheic keratosis: Secondary | ICD-10-CM | POA: Diagnosis not present

## 2013-10-28 DIAGNOSIS — Z85828 Personal history of other malignant neoplasm of skin: Secondary | ICD-10-CM | POA: Diagnosis not present

## 2013-10-29 DIAGNOSIS — Z23 Encounter for immunization: Secondary | ICD-10-CM | POA: Diagnosis not present

## 2013-10-31 DIAGNOSIS — Z23 Encounter for immunization: Secondary | ICD-10-CM | POA: Diagnosis not present

## 2013-11-01 ENCOUNTER — Ambulatory Visit (INDEPENDENT_AMBULATORY_CARE_PROVIDER_SITE_OTHER): Payer: Medicare Other | Admitting: Pharmacist

## 2013-11-01 DIAGNOSIS — I480 Paroxysmal atrial fibrillation: Secondary | ICD-10-CM

## 2013-11-01 DIAGNOSIS — Z7901 Long term (current) use of anticoagulants: Secondary | ICD-10-CM | POA: Diagnosis not present

## 2013-11-01 LAB — POCT INR: INR: 3.3

## 2013-11-01 NOTE — Progress Notes (Signed)
Discussed influenza and pneumonia vaccine - per patient she received influenza vaccine 10/29/13 at [facility] and pneumonia 23 10/31/2013 at [facility] as well. Waiting for documentation to be sent from [facility].

## 2013-11-01 NOTE — Patient Instructions (Signed)
Anticoagulation Dose Instructions as of 11/01/2013     Angela Wong Tue Wed Thu Fri Sat   New Dose 4 mg 2 mg 2 mg 2 mg 2 mg 2 mg 4 mg    Description       No warfarin today - Monday, October 19th, then continue regular warfarin dose of 2mg  Monday through Friday and 4mg  on saturdays and sundays / weekends.      INR was 3.3 today

## 2013-11-02 ENCOUNTER — Encounter: Payer: Self-pay | Admitting: Family Medicine

## 2013-11-02 ENCOUNTER — Ambulatory Visit (INDEPENDENT_AMBULATORY_CARE_PROVIDER_SITE_OTHER): Payer: Medicare Other | Admitting: Family Medicine

## 2013-11-02 VITALS — BP 120/66 | HR 60 | Temp 96.8°F | Ht 65.0 in | Wt 145.0 lb

## 2013-11-02 DIAGNOSIS — Z881 Allergy status to other antibiotic agents status: Secondary | ICD-10-CM | POA: Insufficient documentation

## 2013-11-02 DIAGNOSIS — J069 Acute upper respiratory infection, unspecified: Secondary | ICD-10-CM

## 2013-11-02 DIAGNOSIS — J029 Acute pharyngitis, unspecified: Secondary | ICD-10-CM | POA: Diagnosis not present

## 2013-11-02 DIAGNOSIS — J020 Streptococcal pharyngitis: Secondary | ICD-10-CM

## 2013-11-02 DIAGNOSIS — Z889 Allergy status to unspecified drugs, medicaments and biological substances status: Secondary | ICD-10-CM | POA: Diagnosis not present

## 2013-11-02 LAB — POCT RAPID STREP A (OFFICE): RAPID STREP A SCREEN: NEGATIVE

## 2013-11-02 NOTE — Progress Notes (Signed)
Subjective:    Patient ID: Angela Wong, female    DOB: [birth year], 73 y.o.   MRN: [id number]  HPI Pt here for follow up and management of chronic medical problems. The patient comes in today with fatigue and a very sore throat. The patient had a flu shot 4 days ago. She had a pneumonia shot 2 days ago. She said in creasing problems with reflux and says that her COPD and fatigue are getting worse. The patient indicates that her son has had cervical spine surgery recently that she. She is also having to take care of 73 year old. was to make sure she's not contagious if she is around him          Patient Active Problem List   Diagnosis Date Noted  . History of ovarian cancer 10/01/2013  . GERD (gastroesophageal reflux disease) 10/01/2013  . Benign essential HTN 10/01/2013  . Atrial fibrillation 07/27/2013  . Long term (current) use of anticoagulants 07/27/2013   Outpatient Encounter Prescriptions as of 11/02/2013  Medication Sig  . albuterol (PROVENTIL HFA;VENTOLIN HFA) 108 (90 BASE) MCG/ACT inhaler Inhale 2 puffs into the lungs every 6 (six) hours as needed for wheezing.  Marland Kitchen albuterol (PROVENTIL) (2.5 MG/3ML) 0.083% nebulizer solution Take 2.5 mg by nebulization every 6 (six) hours as needed for wheezing.  Marland Kitchen atenolol (TENORMIN) 50 MG tablet Take 50 mg by mouth every morning.   Marland Kitchen atorvastatin (LIPITOR) 20 MG tablet Take 20 mg by mouth at bedtime.   . B Complex-C (B-COMPLEX WITH VITAMIN C) tablet Take 1 tablet by mouth daily.  . budesonide-formoterol (SYMBICORT) 80-4.5 MCG/ACT inhaler Inhale 1 puff into the lungs 2 (two) times daily.  . bumetanide (BUMEX) 0.5 MG tablet Take 0.5 mg by mouth every morning.  . Cholecalciferol (VITAMIN D) 2000 UNITS CAPS Take 1 capsule by mouth every morning.  . cloNIDine (CATAPRES) 0.1 MG tablet Take 0.1 mg by mouth 2 (two) times daily.  . Cyanocobalamin (VITAMIN B 12 PO) Take 500 mg by mouth daily.  . cyclobenzaprine (FLEXERIL) 5 MG tablet Take 5 mg  by mouth 3 (three) times daily as needed for muscle spasms. Take 1 or 2 tablets at bedtime as needed for restless leg syndrome  . cycloSPORINE (RESTASIS) 0.05 % ophthalmic emulsion Place 1 drop into both eyes 2 (two) times daily.   . diazepam (VALIUM) 5 MG tablet Take 5 mg by mouth every 6 (six) hours as needed for anxiety.  . docusate sodium (COLACE) 100 MG capsule Take 300 mg by mouth daily.  Marland Kitchen esomeprazole (NEXIUM) 40 MG capsule Take 40 mg by mouth daily before breakfast.  . fexofenadine (ALLEGRA) 180 MG tablet Take 180 mg by mouth every morning.   . Iron Polysacch Cmplx-B12-FA (POLYSACCHARIDE IRON FORTE) 150-0.025-1 MG CAPS Take 1 tablet by mouth daily.  Marland Kitchen levothyroxine (SYNTHROID, LEVOTHROID) 100 MCG tablet Take 100 mcg by mouth every morning.  Marland Kitchen losartan (COZAAR) 100 MG tablet Take 100 mg by mouth at bedtime.   . metroNIDAZOLE (METROGEL) 1 % gel Apply 1 application topically daily.   . montelukast (SINGULAIR) 10 MG tablet Take 10 mg by mouth at bedtime.  Marland Kitchen oxyCODONE (OXYCONTIN) 10 MG 12 hr tablet Take 10 mg by mouth every 12 (twelve) hours.  . predniSONE (DELTASONE) 5 MG tablet Take 5 mg by mouth daily.  . ranitidine (ZANTAC) 150 MG tablet Take 150 mg by mouth daily as needed for heartburn.   . Simethicone (GAS-X EXTRA STRENGTH) 125 MG CAPS Take 1 capsule by  mouth daily as needed (for relief).  . Tapentadol HCl (NUCYNTA ER) 100 MG TB12 Take 100 mg by mouth at bedtime as needed (for pain/sleep).   . traZODone (DESYREL) 150 MG tablet Take 150 mg by mouth at bedtime.  Marland Kitchen warfarin (COUMADIN) 1 MG tablet Take 1 mg by mouth as directed. 2 tabs Mon-Fri and 4 tabs on Sat and Sun    Review of Systems  Constitutional: Positive for fatigue. Negative for fever.  HENT: Positive for sore throat (very dry mouth ).   Eyes: Negative.   Respiratory: Negative.   Cardiovascular: Negative.   Gastrointestinal: Negative.   Endocrine: Negative.   Genitourinary: Negative.   Musculoskeletal: Negative.     Skin: Negative.   Allergic/Immunologic: Negative.   Neurological: Negative.   Hematological: Negative.   Psychiatric/Behavioral: Negative.        Objective:   Physical Exam  Nursing note and vitals reviewed. Constitutional: She is oriented to person, place, and time. She appears well-developed. No distress.  Alert somewhat thin  HENT:  Head: Normocephalic and atraumatic.  Right Ear: External ear normal.  Nose: Nose normal.  Mouth/Throat: Oropharynx is clear and moist. No oropharyngeal exudate.  Cerumen left ear canal  Eyes: Conjunctivae and EOM are normal. Pupils are equal, round, and reactive to light. Right eye exhibits no discharge. Left eye exhibits no discharge. No scleral icterus.  Neck: Normal range of motion. Neck supple. No thyromegaly present.  No anterior cervical nodes  Cardiovascular: Normal rate, regular rhythm and normal heart sounds.   No murmur heard. Pulmonary/Chest: Effort normal and breath sounds normal. No respiratory distress. She has no wheezes. She has no rales. She exhibits no tenderness.  Clear anteriorly and posteriorly  Abdominal: Soft. Bowel sounds are normal. She exhibits no mass. There is tenderness. There is no rebound and no guarding.  Epigastric tenderness  Musculoskeletal: Normal range of motion. She exhibits no edema.  Lymphadenopathy:    She has no cervical adenopathy.  Neurological: She is alert and oriented to person, place, and time.  Skin: Skin is warm and dry. No rash noted.  Psychiatric: She has a normal mood and affect. Her behavior is normal. Judgment and thought content normal.   BP 120/66  Pulse 60  Temp(Src) 96.8 F (36 C) (Oral)  Ht 5\' 5"  (1.651 m)  Wt 145 lb (65.772 kg)  BMI 24.13 kg/m2  Results for orders placed in visit on 11/02/13  POCT RAPID STREP A (OFFICE)      Result Value Ref Range   Rapid Strep A Screen Negative  Negative         Assessment & Plan:  1. Streptococcal sore throat - POCT rapid strep A -  Strep A culture, throat  2. Multiple drug allergies  3. Viral URI  4. Sore throat -Rapid strep is negative. - throat culture is pending  Patient Instructions  mucinex for cough and congestion AYR- nasal saline during the day AYR- gel at bedtime Rest as much as possible Plenty of fluids Tylenol for aches pains and fever  Increase Nexium to twice daily if increased reflux problem for one to 2 weeks   Dr Percival Spanish for cardio Dr Renford Dills for eyes [name] for pulmonary Rheumatology- Dr Amil Amen    Arrie Senate MD

## 2013-11-02 NOTE — Patient Instructions (Addendum)
mucinex for cough and congestion AYR- nasal saline during the day AYR- gel at bedtime Rest as much as possible Plenty of fluids Tylenol for aches pains and fever  Increase Nexium to twice daily if increased reflux problem for one to 2 weeks   Dr Percival Spanish for cardio Dr Renford Dills for eyes [name] for pulmonary Rheumatology- Dr Amil Amen

## 2013-11-04 ENCOUNTER — Encounter: Payer: Self-pay | Admitting: *Deleted

## 2013-11-04 LAB — STREP A CULTURE, THROAT: Strep A Culture: NEGATIVE

## 2013-11-08 ENCOUNTER — Encounter: Payer: Self-pay | Admitting: Family Medicine

## 2013-11-08 ENCOUNTER — Ambulatory Visit (INDEPENDENT_AMBULATORY_CARE_PROVIDER_SITE_OTHER): Payer: Medicare Other | Admitting: Family Medicine

## 2013-11-08 VITALS — BP 152/89 | HR 64 | Temp 97.0°F | Ht 65.0 in | Wt 142.0 lb

## 2013-11-08 DIAGNOSIS — I480 Paroxysmal atrial fibrillation: Secondary | ICD-10-CM

## 2013-11-08 DIAGNOSIS — Z7901 Long term (current) use of anticoagulants: Secondary | ICD-10-CM

## 2013-11-08 DIAGNOSIS — J208 Acute bronchitis due to other specified organisms: Secondary | ICD-10-CM | POA: Diagnosis not present

## 2013-11-08 LAB — POCT INR: INR: 3.2

## 2013-11-08 MED ORDER — PREDNISONE 10 MG PO TABS: ORAL_TABLET | ORAL | Status: DC

## 2013-11-08 MED ORDER — AZITHROMYCIN 250 MG PO TABS: ORAL_TABLET | ORAL | Status: DC

## 2013-11-08 MED ORDER — HYDROCODONE-HOMATROPINE 5-1.5 MG/5ML PO SYRP: 5.0000 mL | ORAL_SOLUTION | Freq: Three times a day (TID) | ORAL | Status: DC | PRN

## 2013-11-08 NOTE — Patient Instructions (Signed)
Anticoagulation Dose Instructions as of 11/08/2013     Angela Wong Tue Wed Thu Fri Sat   New Dose 2 mg 2 mg 2 mg 2 mg 2 mg 2 mg 4 mg    Description       No warfarin today - Monday, October 26th, then decrease warfarin dose to 2mg  Sunday through Friday and 4mg  on saturdays only.      INR was 3.2 today

## 2013-11-08 NOTE — Progress Notes (Signed)
   Subjective:    Patient ID: Angela Wong, female    DOB: 1940/04/16, 73 y.o.   MRN: [id number]  HPI Patient is here for c/o SOB and DOE.  She states she is having "copd exacerbation"  Review of Systems  Constitutional: Negative for fever.  HENT: Negative for ear pain.   Eyes: Negative for discharge.  Respiratory: Negative for cough.   Cardiovascular: Negative for chest pain.  Gastrointestinal: Negative for abdominal distention.  Endocrine: Negative for polyuria.  Genitourinary: Negative for difficulty urinating.  Musculoskeletal: Negative for gait problem and neck pain.  Skin: Negative for color change and rash.  Neurological: Negative for speech difficulty and headaches.  Psychiatric/Behavioral: Negative for agitation.       Objective:    BP 152/89  Pulse 64  Temp(Src) 97 F (36.1 C) (Oral)  Ht 5\' 5"  (1.651 m)  Wt 142 lb (64.411 kg)  BMI 23.63 kg/m2 Physical Exam  Constitutional: She is oriented to person, place, and time. She appears well-developed and well-nourished.  HENT:  Head: Normocephalic and atraumatic.  Mouth/Throat: Oropharynx is clear and moist.  Eyes: Pupils are equal, round, and reactive to light.  Neck: Normal range of motion. Neck supple.  Cardiovascular: Normal rate and regular rhythm.   No murmur heard. Pulmonary/Chest: Effort normal and breath sounds normal.  Abdominal: Soft. Bowel sounds are normal. There is no tenderness.  Neurological: She is alert and oriented to person, place, and time.  Skin: Skin is warm and dry.  Psychiatric: She has a normal mood and affect.          Assessment & Plan:     ICD-9-CM ICD-10-CM   1. Paroxysmal atrial fibrillation 427.31 I48.0 POCT INR  2. Long term current use of anticoagulant therapy V58.61 Z79.01 POCT INR  3. Acute bronchitis due to other specified organisms 466.0 J20.8 azithromycin (ZITHROMAX) 250 MG tablet     predniSONE (DELTASONE) 10 MG tablet     HYDROcodone-homatropine (HYCODAN) 5-1.5  MG/5ML syrup     No Follow-up on file.  Lysbeth Penner FNP

## 2013-11-10 ENCOUNTER — Ambulatory Visit: Payer: Self-pay | Admitting: Family Medicine

## 2013-11-15 ENCOUNTER — Ambulatory Visit (INDEPENDENT_AMBULATORY_CARE_PROVIDER_SITE_OTHER): Payer: Medicare Other | Admitting: Pharmacist

## 2013-11-15 DIAGNOSIS — I480 Paroxysmal atrial fibrillation: Secondary | ICD-10-CM | POA: Diagnosis not present

## 2013-11-15 DIAGNOSIS — Z7901 Long term (current) use of anticoagulants: Secondary | ICD-10-CM

## 2013-11-15 LAB — POCT INR: INR: 3

## 2013-11-15 NOTE — Patient Instructions (Signed)
Anticoagulation Dose Instructions as of 11/15/2013      Angela Wong Tue Wed Thu Fri Sat   New Dose 2 mg 2 mg 2 mg 2 mg 2 mg 2 mg 4 mg    Description        No warfarin today - Monday, November 2nd, then continue current warfarin dose to 2mg  Sunday through Friday and 4mg  on saturdays only.     INR was 3.0 today

## 2013-11-26 ENCOUNTER — Other Ambulatory Visit: Payer: Self-pay | Admitting: *Deleted

## 2013-11-26 ENCOUNTER — Ambulatory Visit (INDEPENDENT_AMBULATORY_CARE_PROVIDER_SITE_OTHER): Payer: Medicare Other | Admitting: Family Medicine

## 2013-11-26 ENCOUNTER — Other Ambulatory Visit: Payer: Self-pay | Admitting: Family Medicine

## 2013-11-26 ENCOUNTER — Encounter: Payer: Self-pay | Admitting: Family Medicine

## 2013-11-26 ENCOUNTER — Encounter (INDEPENDENT_AMBULATORY_CARE_PROVIDER_SITE_OTHER): Payer: Self-pay

## 2013-11-26 VITALS — BP 129/87 | HR 69 | Temp 96.9°F | Ht 65.0 in | Wt 153.9 lb

## 2013-11-26 DIAGNOSIS — R5381 Other malaise: Secondary | ICD-10-CM | POA: Diagnosis not present

## 2013-11-26 DIAGNOSIS — M3500 Sicca syndrome, unspecified: Secondary | ICD-10-CM

## 2013-11-26 DIAGNOSIS — J449 Chronic obstructive pulmonary disease, unspecified: Secondary | ICD-10-CM

## 2013-11-26 DIAGNOSIS — I4891 Unspecified atrial fibrillation: Secondary | ICD-10-CM

## 2013-11-26 DIAGNOSIS — I10 Essential (primary) hypertension: Secondary | ICD-10-CM | POA: Diagnosis not present

## 2013-11-26 DIAGNOSIS — R5383 Other fatigue: Secondary | ICD-10-CM | POA: Diagnosis not present

## 2013-11-26 DIAGNOSIS — I480 Paroxysmal atrial fibrillation: Secondary | ICD-10-CM

## 2013-11-26 DIAGNOSIS — J441 Chronic obstructive pulmonary disease with (acute) exacerbation: Secondary | ICD-10-CM

## 2013-11-26 DIAGNOSIS — E785 Hyperlipidemia, unspecified: Secondary | ICD-10-CM

## 2013-11-26 DIAGNOSIS — Z7901 Long term (current) use of anticoagulants: Secondary | ICD-10-CM

## 2013-11-26 DIAGNOSIS — H547 Unspecified visual loss: Secondary | ICD-10-CM

## 2013-11-26 LAB — POCT URINALYSIS DIPSTICK
Bilirubin, UA: NEGATIVE
Blood, UA: NEGATIVE
Glucose, UA: NEGATIVE
Ketones, UA: NEGATIVE
LEUKOCYTES UA: NEGATIVE
NITRITE UA: NEGATIVE
PROTEIN UA: NEGATIVE
Spec Grav, UA: <=1.005
UROBILINOGEN UA: NEGATIVE
pH, UA: 7.5

## 2013-11-26 LAB — POCT CBC
GRANULOCYTE PERCENT: 73.9 % (ref 37–80)
HEMATOCRIT: 42.6 % (ref 37.7–47.9)
Hemoglobin: 14.2 g/dL (ref 12.2–16.2)
Lymph, poc: 2 (ref 0.6–3.4)
MCH, POC: 30.6 pg (ref 27–31.2)
MCHC: 33.4 g/dL (ref 31.8–35.4)
MCV: 91.7 fL (ref 80–97)
MPV: 9.8 fL (ref 0–99.8)
PLATELET COUNT, POC: 160 10*3/uL (ref 142–424)
POC GRANULOCYTE: 6.2 (ref 2–6.9)
POC LYMPH %: 23.6 % (ref 10–50)
RBC: 4.6 M/uL (ref 4.04–5.48)
RDW, POC: 13.1 %
WBC: 8.4 10*3/uL (ref 4.6–10.2)

## 2013-11-26 LAB — POCT INR: INR: 2.4

## 2013-11-26 MED ORDER — CYCLOSPORINE 0.05 % OP EMUL: 1.0000 [drp] | Freq: Two times a day (BID) | OPHTHALMIC | Status: DC

## 2013-11-26 MED ORDER — RANITIDINE HCL 150 MG PO TABS: 150.0000 mg | ORAL_TABLET | Freq: Every day | ORAL | Status: DC | PRN

## 2013-11-26 MED ORDER — TAPENTADOL HCL ER 100 MG PO TB12: 100.0000 mg | ORAL_TABLET | Freq: Every evening | ORAL | Status: DC | PRN

## 2013-11-26 MED ORDER — LEVOTHYROXINE SODIUM 100 MCG PO TABS: 100.0000 ug | ORAL_TABLET | Freq: Every morning | ORAL | Status: DC

## 2013-11-26 MED ORDER — BUDESONIDE-FORMOTEROL FUMARATE 80-4.5 MCG/ACT IN AERO: 1.0000 | INHALATION_SPRAY | Freq: Two times a day (BID) | RESPIRATORY_TRACT | Status: DC

## 2013-11-26 MED ORDER — ATENOLOL 50 MG PO TABS: 50.0000 mg | ORAL_TABLET | Freq: Every morning | ORAL | Status: DC

## 2013-11-26 MED ORDER — BUMETANIDE 0.5 MG PO TABS: 0.5000 mg | ORAL_TABLET | Freq: Every morning | ORAL | Status: DC

## 2013-11-26 MED ORDER — IRON POLYSACCH CMPLX-B12-FA 150-0.025-1 MG PO CAPS: 1.0000 | ORAL_CAPSULE | Freq: Every day | ORAL | Status: DC

## 2013-11-26 MED ORDER — CYCLOBENZAPRINE HCL 5 MG PO TABS: 5.0000 mg | ORAL_TABLET | Freq: Three times a day (TID) | ORAL | Status: DC | PRN

## 2013-11-26 MED ORDER — OXYCODONE HCL ER 10 MG PO T12A: 10.0000 mg | EXTENDED_RELEASE_TABLET | Freq: Two times a day (BID) | ORAL | Status: DC

## 2013-11-26 MED ORDER — DIAZEPAM 5 MG PO TABS: 5.0000 mg | ORAL_TABLET | Freq: Four times a day (QID) | ORAL | Status: DC | PRN

## 2013-11-26 MED ORDER — LOSARTAN POTASSIUM 100 MG PO TABS: 100.0000 mg | ORAL_TABLET | Freq: Every day | ORAL | Status: DC

## 2013-11-26 MED ORDER — WARFARIN SODIUM 1 MG PO TABS: 1.0000 mg | ORAL_TABLET | ORAL | Status: DC

## 2013-11-26 MED ORDER — ESOMEPRAZOLE MAGNESIUM 40 MG PO CPDR: 40.0000 mg | DELAYED_RELEASE_CAPSULE | Freq: Every day | ORAL | Status: DC

## 2013-11-26 MED ORDER — ATORVASTATIN CALCIUM 20 MG PO TABS: 20.0000 mg | ORAL_TABLET | Freq: Every day | ORAL | Status: DC

## 2013-11-26 MED ORDER — MONTELUKAST SODIUM 10 MG PO TABS: 10.0000 mg | ORAL_TABLET | Freq: Every day | ORAL | Status: DC

## 2013-11-26 NOTE — Patient Instructions (Signed)
Anticoagulation Dose Instructions as of 11/26/2013      Angela Wong Tue Wed Thu Fri Sat   New Dose 2 mg 2 mg 2 mg 2 mg 2 mg 2 mg 4 mg    Description        Continue current warfarin dose to 2mg  Sunday through Friday and 4mg  on saturdays only.      INR was 2.4 today

## 2013-11-26 NOTE — Progress Notes (Signed)
   Subjective:    Patient ID: Angela Wong, female    DOB: Mar 09, 1940, 73 y.o.   MRN: [id number]  HPI 73 year old female who has been seen here briefly passed and is followed for anticoagulation that she takes for atrial failure. She is extremely complicated. She moved down here from [place] to be near her sons. She has multiple multiple drug allergies Conditions and surgeries include atrial fibrillation, Sjogren's syndrome, fibromyalgia, Raynaud syndrome, mitral valve prolapse, GERD, restless leg syndrome, osteoarthritis, especially left knee, hypertension, dysgerminoma left ovary (1962) carpal tunnel right hand, right breast biopsy 2, negative for cancer,. There are other surgeries and lesser diagnoses.    Review of Systems  Constitutional: Positive for fatigue.  HENT: Negative.   Eyes: Positive for visual disturbance.  Respiratory: Positive for cough and shortness of breath.   Cardiovascular: Positive for leg swelling.  Gastrointestinal:       GERD  Genitourinary: Positive for dysuria.  Musculoskeletal: Positive for arthralgias.  Skin: Positive for rash.       Objective:   Physical Exam  Constitutional: She appears well-developed and well-nourished.  HENT:  Head: Normocephalic.  Eyes: Pupils are equal, round, and reactive to light.  Cardiovascular:  Irregular rate consistent with atrial fibrillation  Pulmonary/Chest: Effort normal and breath sounds normal.  Abdominal: Soft.  Genitourinary:  Not examined has had total hysterectomy and partial salpingectomy     BP 129/87 mmHg  Pulse 69  Temp(Src) 96.9 F (36.1 C) (Oral)  Ht $R'5\' 5"'Th$  (1.651 m)  Wt 153 lb 14.4 oz (69.809 kg)  BMI 25.61 kg/m2     Assessment & Plan:  1. Paroxysmal atrial fibrillation Rate controlled and anticoagulated - POCT INR - POCT CBC  2. Long term current use of anticoagulant therapy  - POCT INR  3. Atrial fibrillation, unspecified   4. Benign essential HTN  - POCT urinalysis  dipstick - CMP14+EGFR  5. Hyperlipidemia  - Lipid panel  6. Malaise and fatigue  - TSH  7. Chronic obstructive pulmonary disease, unspecified COPD, unspecified chronic bronchitis type Breathing seems to be controlled although she states she is short of breath. Continue same meds  Wardell Honour MD

## 2013-11-26 NOTE — Addendum Note (Signed)
Addended by: Shelbie Ammons on: 11/26/2013 01:51 PM   Modules accepted: Orders

## 2013-11-27 LAB — LIPID PANEL
CHOLESTEROL TOTAL: 175 mg/dL (ref 100–199)
Chol/HDL Ratio: 1.8 ratio units (ref 0.0–4.4)
HDL: 95 mg/dL (ref >39)
LDL Calculated: 67 mg/dL (ref 0–99)
Triglycerides: 65 mg/dL (ref 0–149)
VLDL CHOLESTEROL CAL: 13 mg/dL (ref 5–40)

## 2013-11-27 LAB — CMP14+EGFR
ALT: 17 IU/L (ref 0–32)
AST: 14 IU/L (ref 0–40)
Albumin/Globulin Ratio: 2.1 (ref 1.1–2.5)
Albumin: 4.1 g/dL (ref 3.5–4.8)
Alkaline Phosphatase: 80 IU/L (ref 39–117)
BUN/Creatinine Ratio: 13 (ref 11–26)
BUN: 11 mg/dL (ref 8–27)
CALCIUM: 9 mg/dL (ref 8.7–10.3)
CHLORIDE: 100 mmol/L (ref 97–108)
CO2: 27 mmol/L (ref 18–29)
Creatinine, Ser: 0.82 mg/dL (ref 0.57–1.00)
GFR calc Af Amer: 82 mL/min/{1.73_m2} (ref >59)
GFR calc non Af Amer: 71 mL/min/{1.73_m2} (ref >59)
GLUCOSE: 84 mg/dL (ref 65–99)
Globulin, Total: 2 g/dL (ref 1.5–4.5)
POTASSIUM: 3.7 mmol/L (ref 3.5–5.2)
SODIUM: 142 mmol/L (ref 134–144)
TOTAL PROTEIN: 6.1 g/dL (ref 6.0–8.5)
Total Bilirubin: 0.9 mg/dL (ref 0.0–1.2)

## 2013-11-27 LAB — TSH: TSH: 1.28 u[IU]/mL (ref 0.450–4.500)

## 2013-11-29 ENCOUNTER — Other Ambulatory Visit: Payer: Self-pay | Admitting: Family Medicine

## 2013-11-29 NOTE — Telephone Encounter (Signed)
Patient is requesting prednisone 5mg  daily as was on her medication list.   If aproved route back to nurse pool A and it can be called or sent in. She got the singulair refill.

## 2013-12-01 ENCOUNTER — Other Ambulatory Visit: Payer: Self-pay | Admitting: *Deleted

## 2013-12-01 MED ORDER — PREDNISONE 5 MG PO TABS: 5.0000 mg | ORAL_TABLET | Freq: Every day | ORAL | Status: DC

## 2013-12-01 NOTE — Telephone Encounter (Signed)
Informed pt refilled prednisone & symbicort went last week. Pt informed me that yes her symbicort was there it was just too early with her insurance.

## 2013-12-01 NOTE — Telephone Encounter (Signed)
We met to refill all prescriptions so okay to call this prescription in to [facility] same dose and direction

## 2013-12-06 ENCOUNTER — Telehealth: Payer: Self-pay | Admitting: *Deleted

## 2013-12-06 NOTE — Telephone Encounter (Signed)
Aware,scripts were done.

## 2013-12-07 DIAGNOSIS — G43109 Migraine with aura, not intractable, without status migrainosus: Secondary | ICD-10-CM | POA: Diagnosis not present

## 2013-12-13 ENCOUNTER — Encounter: Payer: Self-pay | Admitting: Internal Medicine

## 2013-12-13 ENCOUNTER — Ambulatory Visit (INDEPENDENT_AMBULATORY_CARE_PROVIDER_SITE_OTHER): Payer: Medicare Other | Admitting: Internal Medicine

## 2013-12-13 VITALS — BP 144/80 | HR 75 | Ht 65.0 in | Wt 150.0 lb

## 2013-12-13 DIAGNOSIS — R0600 Dyspnea, unspecified: Secondary | ICD-10-CM | POA: Diagnosis not present

## 2013-12-13 DIAGNOSIS — R5383 Other fatigue: Secondary | ICD-10-CM | POA: Diagnosis not present

## 2013-12-13 DIAGNOSIS — Z87312 Personal history of (healed) stress fracture: Secondary | ICD-10-CM | POA: Diagnosis not present

## 2013-12-13 DIAGNOSIS — M2550 Pain in unspecified joint: Secondary | ICD-10-CM | POA: Diagnosis not present

## 2013-12-13 DIAGNOSIS — M79672 Pain in left foot: Secondary | ICD-10-CM | POA: Diagnosis not present

## 2013-12-13 DIAGNOSIS — M1990 Unspecified osteoarthritis, unspecified site: Secondary | ICD-10-CM | POA: Diagnosis not present

## 2013-12-13 DIAGNOSIS — R5382 Chronic fatigue, unspecified: Secondary | ICD-10-CM | POA: Diagnosis not present

## 2013-12-13 DIAGNOSIS — M1610 Unilateral primary osteoarthritis, unspecified hip: Secondary | ICD-10-CM | POA: Diagnosis not present

## 2013-12-13 DIAGNOSIS — M19071 Primary osteoarthritis, right ankle and foot: Secondary | ICD-10-CM | POA: Diagnosis not present

## 2013-12-13 DIAGNOSIS — M3500 Sicca syndrome, unspecified: Secondary | ICD-10-CM | POA: Diagnosis not present

## 2013-12-13 DIAGNOSIS — G8929 Other chronic pain: Secondary | ICD-10-CM | POA: Diagnosis not present

## 2013-12-13 MED ORDER — RANITIDINE HCL 150 MG PO TABS: ORAL_TABLET | ORAL | Status: DC

## 2013-12-13 MED ORDER — BUDESONIDE-FORMOTEROL FUMARATE 80-4.5 MCG/ACT IN AERO: INHALATION_SPRAY | RESPIRATORY_TRACT | Status: DC

## 2013-12-13 NOTE — Patient Instructions (Addendum)
In the future may need to stop atenolol and replace it with bisoprolol 5 mg one half daily so we don't block your lung receptors to symbicort  Change  symbicort to 80 Take 2 puffs first thing in am and then another 2 puffs about 12 hours later until return  Work on inhaler technique:  relax and gently blow all the way out then take a nice smooth deep breath back in, triggering the inhaler at same time you start breathing in.  Hold for up to 5 seconds if you can.  Rinse and gargle with water when done    Continue nexium 40 mg   Take 30-60 min before first meal of the day and Zantac 150  mg one bedtime until return to office - this is the best way to tell whether stomach acid is contributing to your problem.    GERD (REFLUX)  is an extremely common cause of respiratory symptoms just like yours , many times with no obvious heartburn at all.    It can be treated with medication, but also with lifestyle changes including avoidance of late meals, excessive alcohol, smoking cessation, and avoid fatty foods, chocolate, peppermint, colas, red wine, and acidic juices such as orange juice.  NO MINT OR MENTHOL PRODUCTS SO NO COUGH DROPS  USE SUGARLESS CANDY INSTEAD (Jolley ranchers or Stover's or Life Savers) or even ice chips will also do - the key is to swallow to prevent all throat clearing. NO OIL BASED VITAMINS - use powdered substitutes.   Please schedule a follow up office visit in 6 weeks, call sooner if needed with full pfts and cxr same day

## 2013-12-13 NOTE — Progress Notes (Signed)
Subjective:    Patient ID: Angela Wong, female    DOB: 11-07-1940  MRN: [id number]  HPI  87 yowf never smoker referred by Dr Sabra Heck with [facility] in [place]  with dx of asthma vs copd (given this dx by [facility] doc.    12/13/2013 1st [facility] office visit/ Loyed Wilmes   Chief Complaint  Patient presents with  . Pulmonary Consult    Referred by Dr Sabra Heck. Pt states that she was dxed with exercise induced asthma in her 55's and "cold induced asthma" in her 58's. She states that she was also dxed with COPD also. She c/o DOE with walking to her mailbox for the past yr.    has  Been on symbicort for years with minimal need for rescue  but since  the spring of 2015 on symbicort 160 one bid vs 80 2bid with daily sob mailbox and back maybe 125ft esp if walks standing straight up or humid or cold - can do grocery store leaning on a cart  Some coughing with talking,  No am exac or excess/ purulent sputum   sjogren's on 5 mg daily   No obvious other patterns in day to day or daytime variabilty or assoc Cp subjective wheeze overt sinus or hb symptoms. No unusual exp hx or h/o childhood pna/ asthma or knowledge of premature birth.  Sleeping ok without nocturnal  or early am exacerbation  of respiratory  c/o's or need for noct saba. Also denies any obvious fluctuation of symptoms with weather or environmental changes or other aggravating or alleviating factors except as outlined above   Current Medications, Allergies, Complete Past Medical History, Past Surgical History, Family History, and Social History were reviewed in [employer] record.             Review of Systems  Constitutional: Negative for fever, chills and unexpected weight change.  HENT: Positive for congestion. Negative for dental problem, ear pain, nosebleeds, postnasal drip, rhinorrhea, sinus pressure, sneezing, sore throat, trouble swallowing and voice change.   Eyes: Negative  for visual disturbance.  Respiratory: Positive for cough and shortness of breath. Negative for choking.   Cardiovascular: Negative for chest pain and leg swelling.  Gastrointestinal: Negative for vomiting, abdominal pain and diarrhea.  Genitourinary: Negative for difficulty urinating.  Musculoskeletal: Positive for arthralgias.  Skin: Negative for rash.  Neurological: Negative for tremors, syncope and headaches.  Hematological: Does not bruise/bleed easily.       Objective:   Physical Exam  amb wf nad  Wt Readings from Last 3 Encounters:  12/13/13 150 lb (68.04 kg)  11/26/13 153 lb 14.4 oz (69.809 kg)  11/08/13 142 lb (64.411 kg)    Vital signs reviewed     HEENT: nl dentition, turbinates, and orophanx. Nl external ear canals without cough reflex   NECK :  without JVD/Nodes/TM/ nl carotid upstrokes bilaterally   LUNGS: no acc muscle use, clear to A and P bilaterally without cough on insp or exp maneuvers   CV:  RRR  no s3 or murmur or increase in P2, no edema   ABD:  soft and nontender with nl excursion in the supine position. No bruits or organomegaly, bowel sounds nl  MS:  warm without deformities, calf tenderness, cyanosis or clubbing  SKIN: warm and dry without lesions    NEURO:  alert, approp, no deficits    cxr 07/27/13 Bronchitic changes without infiltrate   Lab Results  Component Value Date  WBC 8.4 11/26/2013   HGB 14.2 11/26/2013   HCT 42.6 11/26/2013   MCV 91.7 11/26/2013      Chemistry      Component Value Date/Time   NA 142 11/26/2013 1221   NA 139 04/29/2012 1715   K 3.7 11/26/2013 1221   CL 100 11/26/2013 1221   CO2 27 11/26/2013 1221   BUN 11 11/26/2013 1221   BUN 16 04/29/2012 1715   CREATININE 0.82 11/26/2013 1221      Component Value Date/Time   CALCIUM 9.0 11/26/2013 1221   ALKPHOS 80 11/26/2013 1221   AST 14 11/26/2013 1221   ALT 17 11/26/2013 1221   BILITOT 0.9 11/26/2013 1221      Lab Results  Component Value Date    TSH 1.280 11/26/2013     No results found for: PROBNP   Lab Results  Component Value Date   ESRSEDRATE 5 04/29/2012         Assessment & Plan:

## 2013-12-14 NOTE — Assessment & Plan Note (Addendum)
-   12/13/2013 spirometry wnl  - 12/13/2013  Walked RA x 3 laps @ 185 ft each stopped due to end of study, some sob and chest tightness, no desat @ nl pace   Clearly this is not copd so it never was. Symptoms are markedly disproportionate to objective findings and not clear this is a lung problem but pt does appear to have difficult airway management issues. DDX of  difficult airways management all start with A and  include Adherence, Ace Inhibitors, Acid Reflux, Active Sinus Disease, Alpha 1 Antitripsin deficiency, Anxiety masquerading as Airways dz,  ABPA,  allergy(esp in young), Aspiration (esp in elderly), Adverse effects of DPI,  Active smokers, plus two Bs  = Bronchiectasis and Beta blocker use..and one C= CHF  Adherence is always the initial "prime suspect" and is a multilayered concern that requires a "trust but verify" approach in every patient - starting with knowing how to use medications, especially inhalers, correctly, keeping up with refills and understanding the fundamental difference between maintenance and prns vs those medications only taken for a very short course and then stopped and not refilled.   - The proper method of use, as well as anticipated side effects, of a metered-dose inhaler are discussed and demonstrated to the patient. Improved effectiveness after extensive coaching during this visit to a level of approximately  90% so continue symbicort 80 2bid for now  ? Acid (or non-acid) GERD > always difficult to exclude as up to 75% of pts in some series report no assoc GI/ Heartburn symptoms> rec max (24h)  acid suppression and diet restrictions/ reviewed and instructions given in writing.   ? Allergy/ asthma > continue symbicort 80 2bid but note on pred 5 mg daily so should cover this fine   ? Chf/ angina equivalent > if so it is a stable pattern but plans cards f/u which is appropriate  ? BB > if needs higher doses Strongly prefer in this setting: Bystolic, the most beta -1   selective Beta blocker available in sample form, with bisoprolol the most selective generic choice  on the market.   ? Anxiety related  > dx of exclusion     Each maintenance medication was reviewed in detail including most importantly the difference between maintenance and as needed and under what circumstances the prns are to be used.  Please see instructions for details which were reviewed in writing and the patient given a copy.

## 2013-12-27 ENCOUNTER — Ambulatory Visit (INDEPENDENT_AMBULATORY_CARE_PROVIDER_SITE_OTHER): Payer: Medicare Other | Admitting: Pharmacist

## 2013-12-27 DIAGNOSIS — I4891 Unspecified atrial fibrillation: Secondary | ICD-10-CM | POA: Diagnosis not present

## 2013-12-27 DIAGNOSIS — I480 Paroxysmal atrial fibrillation: Secondary | ICD-10-CM

## 2013-12-27 DIAGNOSIS — Z7901 Long term (current) use of anticoagulants: Secondary | ICD-10-CM | POA: Diagnosis not present

## 2013-12-27 LAB — POCT INR: INR: 2.1

## 2014-01-17 DIAGNOSIS — M810 Age-related osteoporosis without current pathological fracture: Secondary | ICD-10-CM | POA: Diagnosis not present

## 2014-01-17 DIAGNOSIS — M3500 Sicca syndrome, unspecified: Secondary | ICD-10-CM | POA: Diagnosis not present

## 2014-01-17 DIAGNOSIS — I7300 Raynaud's syndrome without gangrene: Secondary | ICD-10-CM | POA: Diagnosis not present

## 2014-01-17 DIAGNOSIS — G8929 Other chronic pain: Secondary | ICD-10-CM | POA: Diagnosis not present

## 2014-01-17 DIAGNOSIS — M1990 Unspecified osteoarthritis, unspecified site: Secondary | ICD-10-CM | POA: Diagnosis not present

## 2014-01-19 ENCOUNTER — Other Ambulatory Visit: Payer: Self-pay

## 2014-01-19 MED ORDER — ESOMEPRAZOLE MAGNESIUM 40 MG PO CPDR: 40.0000 mg | DELAYED_RELEASE_CAPSULE | Freq: Every day | ORAL | Status: DC

## 2014-01-24 ENCOUNTER — Encounter: Payer: Self-pay | Admitting: Cardiology

## 2014-01-24 ENCOUNTER — Ambulatory Visit (INDEPENDENT_AMBULATORY_CARE_PROVIDER_SITE_OTHER): Payer: Medicare Other | Admitting: Cardiology

## 2014-01-24 VITALS — BP 148/80 | HR 67 | Ht 65.0 in | Wt 152.0 lb

## 2014-01-24 DIAGNOSIS — I4819 Other persistent atrial fibrillation: Secondary | ICD-10-CM

## 2014-01-24 DIAGNOSIS — I481 Persistent atrial fibrillation: Secondary | ICD-10-CM

## 2014-01-24 DIAGNOSIS — I10 Essential (primary) hypertension: Secondary | ICD-10-CM | POA: Diagnosis not present

## 2014-01-24 DIAGNOSIS — R0600 Dyspnea, unspecified: Secondary | ICD-10-CM

## 2014-01-24 DIAGNOSIS — Z79899 Other long term (current) drug therapy: Secondary | ICD-10-CM

## 2014-01-24 LAB — T3, FREE: T3, Free: 2.7 pg/mL (ref 2.3–4.2)

## 2014-01-24 LAB — BASIC METABOLIC PANEL
BUN: 17 mg/dL (ref 6–23)
CO2: 28 mEq/L (ref 19–32)
CREATININE: 0.74 mg/dL (ref 0.50–1.10)
Calcium: 8.8 mg/dL (ref 8.4–10.5)
Chloride: 103 mEq/L (ref 96–112)
Glucose, Bld: 75 mg/dL (ref 70–99)
Potassium: 3.7 mEq/L (ref 3.5–5.3)
Sodium: 141 mEq/L (ref 135–145)

## 2014-01-24 LAB — T4, FREE: FREE T4: 1.38 ng/dL (ref 0.80–1.80)

## 2014-01-24 NOTE — Progress Notes (Signed)
HPI The patient presents as a new patient. Angela Wong had a long medical history. Angela Wong's moving here from [place] permanently. Angela Wong has had long-standing atrial fibrillation. Angela Wong's been managed with rate control and anticoagulation. Angela Wong apparently chose not to have cardioversion in the past. Angela Wong is referred for followup. Angela Wong has multiple complaints and has recently seen a pulmonologist for dyspnea. Angela Wong's had fatigue and I did review labs. TSH and CBC were normal. Angela Wong does have chronic pain from multiple sources. Angela Wong does eat well. He notices sometimes her heart slow and sometimes fast but it's been fairly steady. In the past 2 slow and Angela Wong was taking digoxin. He tolerates anticoagulation. Angela Wong denies any presyncope or syncope. Angela Wong has no new chest pressure, neck or arm discomfort. Angela Wong has no PND or orthopnea. Angela Wong does have some mild lower extremity swelling which seems to be chronic.  Allergies  Allergen Reactions  . Cephalosporins Hives and Shortness Of Breath  . Ciprofloxacin Hives and Shortness Of Breath  . Doxycycline Hives and Shortness Of Breath  . Horse-Derived Products Anaphylaxis  . Ketek [Telithromycin] Palpitations    Chest discomfort  . Nitrofuran Derivatives Anaphylaxis and Hives  . Nitrous Oxide Nausea And Vomiting    Severe due to Sjogrens (Auto-Immune Disease)  . Other Anaphylaxis    ALLERGY TO HORSE SERUM  . Penicillins Hives and Shortness Of Breath  . Sulfa Antibiotics Hives and Shortness Of Breath  . Talwin [Pentazocine] Other (See Comments)    Altered Mental Status   . Amlodipine Swelling  . Codeine Nausea Only  . Cymbalta [Duloxetine Hcl] Swelling  . Diovan [Valsartan] Swelling  . Lisinopril Swelling  . Tramadol Nausea Only  . Zoloft [Sertraline Hcl]   . Trovan [Alatrofloxacin] Palpitations and Other (See Comments)    Chest pain, dizziness, irregular pulse    Current Outpatient Prescriptions  Medication Sig Dispense Refill  . albuterol (PROVENTIL HFA;VENTOLIN HFA)  108 (90 BASE) MCG/ACT inhaler Inhale 2 puffs into the lungs every 6 (six) hours as needed for wheezing.    Marland Kitchen albuterol (PROVENTIL) (2.5 MG/3ML) 0.083% nebulizer solution Take 2.5 mg by nebulization every 6 (six) hours as needed for wheezing.    Marland Kitchen atenolol (TENORMIN) 50 MG tablet Take 1 tablet (50 mg total) by mouth every morning. 30 tablet 2  . atorvastatin (LIPITOR) 20 MG tablet Take 1 tablet (20 mg total) by mouth at bedtime. 90 tablet 0  . B Complex-C (B-COMPLEX WITH VITAMIN C) tablet Take 1 tablet by mouth daily.    . budesonide-formoterol (SYMBICORT) 80-4.5 MCG/ACT inhaler Take 2 puffs first thing in am and then another 2 puffs about 12 hours later.    . bumetanide (BUMEX) 0.5 MG tablet Take 1 tablet (0.5 mg total) by mouth every morning. 90 tablet 0  . Cholecalciferol (VITAMIN D) 2000 UNITS CAPS Take 1 capsule by mouth every morning.    . cloNIDine (CATAPRES) 0.1 MG tablet Take 0.1 mg by mouth 2 (two) times daily.    . Cyanocobalamin (VITAMIN B 12 PO) Take 500 mg by mouth daily.    . cyclobenzaprine (FLEXERIL) 5 MG tablet Take 1 tablet (5 mg total) by mouth 3 (three) times daily as needed for muscle spasms. Take 1 or 2 tablets at bedtime as needed for restless leg syndrome 90 tablet 0  . cycloSPORINE (RESTASIS) 0.05 % ophthalmic emulsion Place 1 drop into both eyes 2 (two) times daily. 0.4 mL 1  . diazepam (VALIUM) 5 MG tablet Take 1 tablet (5 mg total) by  mouth every 6 (six) hours as needed for anxiety. 30 tablet 0  . docusate sodium (COLACE) 100 MG capsule Take 300 mg by mouth daily.    Marland Kitchen esomeprazole (NEXIUM) 40 MG capsule Take 1 capsule (40 mg total) by mouth daily before breakfast. (Patient taking differently: Take 40 mg by mouth daily. ) 90 capsule 0  . fexofenadine (ALLEGRA) 180 MG tablet Take 180 mg by mouth every morning.     . Iron Polysacch Cmplx-B12-FA (POLYSACCHARIDE IRON FORTE) 150-0.025-1 MG CAPS Take 1 tablet by mouth daily. 90 each 0  . levothyroxine (SYNTHROID, LEVOTHROID)  100 MCG tablet Take 1 tablet (100 mcg total) by mouth every morning. 90 tablet 0  . losartan (COZAAR) 100 MG tablet Take 1 tablet (100 mg total) by mouth at bedtime. 90 tablet 0  . metroNIDAZOLE (METROGEL) 1 % gel Apply 1 application topically daily.     . montelukast (SINGULAIR) 10 MG tablet Take 1 tablet (10 mg total) by mouth at bedtime. 90 tablet 0  . OxyCODONE (OXYCONTIN) 10 mg T12A 12 hr tablet Take 1 tablet (10 mg total) by mouth every 12 (twelve) hours. (Patient taking differently: Take 10 mg by mouth as needed (sereve pain). ) 60 tablet 0  . predniSONE (DELTASONE) 5 MG tablet Take 1 tablet (5 mg total) by mouth daily. 90 tablet 0  . PREVIDENT 5000 DRY MOUTH 1.1 % GEL dental gel     . ranitidine (ZANTAC) 150 MG tablet One at bedtime  0  . Simethicone (GAS-X EXTRA STRENGTH) 125 MG CAPS Take 1 capsule by mouth daily as needed (for relief).    . Tapentadol HCl (NUCYNTA ER) 100 MG TB12 Take 100 mg by mouth at bedtime as needed (for pain/sleep). 30 tablet 0  . traZODone (DESYREL) 150 MG tablet Take 150 mg by mouth at bedtime.    Marland Kitchen warfarin (COUMADIN) 1 MG tablet Take 1 tablet (1 mg total) by mouth as directed. 2 tabs Mon-Fri and 4 tabs on Sat and Sun 90 tablet 0   No current facility-administered medications for this visit.    Past Medical History  Diagnosis Date  . A-fib   . Sjogren's syndrome   . Fibromyalgia   . Raynaud's disease   . GERD (gastroesophageal reflux disease)   . RLS (restless legs syndrome)   . Osteoarthritis   . IBS (irritable bowel syndrome)   . COPD (chronic obstructive pulmonary disease)   . Ovarian cancer     lymph node removal with hysterectomy  . MVP (mitral valve prolapse)   . Asthma   . Cerebral vasculitis     Past Surgical History  Procedure Laterality Date  . Appendectomy    . Abdominal hysterectomy    . Cataract extraction Left   . Breast surgery      Biopsy    Family History  Problem Relation Age of Onset  . COPD Mother   . Cancer Mother      Breast  . Heart disease Father   . Kidney disease Father   . Stroke Sister   . Arthritis/Rheumatoid Sister   . Stroke Paternal Grandmother   . Scleroderma Grandchild   . Thyroid disease Other     History   Social History  . Marital Status: Divorced    Spouse Name: N/A    Number of Children: N/A  . Years of Education: N/A   Occupational History  . Not on file.   Social History Main Topics  . Smoking status: Never Smoker   .  Smokeless tobacco: Never Used  . Alcohol Use: No  . Drug Use: No  . Sexual Activity: Not on file   Other Topics Concern  . Not on file   Social History Narrative    ROS:  GERD, poor sleep, joint pains, reflux, IBS.  Otherwise as stated in the HPI and negative for all other systems.  PHYSICAL EXAM BP 148/80 mmHg  Pulse 67  Ht 5\' 5"  (1.651 m)  Wt 152 lb (68.947 kg)  BMI 25.29 kg/m2  GENERAL:  Well appearing HEENT:  Pupils equal round and reactive, fundi not visualized, oral mucosa unremarkable NECK:  No jugular venous distention, waveform within normal limits, carotid upstroke brisk and symmetric, no bruits, no thyromegaly LYMPHATICS:  No cervical, inguinal adenopathy LUNGS:  Clear to auscultation bilaterally BACK:  No CVA tenderness CHEST:  Unremarkable HEART:  PMI not displaced or sustained,S1 and S2 within normal limits, no S3, no clicks, no rubs, no murmurs,  irregular ABD:  Flat, positive bowel sounds normal in frequency in pitch, no bruits, no rebound, no guarding, no midline pulsatile mass, no hepatomegaly, no splenomegaly EXT:  2 plus pulses throughout, no edema, no cyanosis no clubbing SKIN:  No rashes no nodules NEURO:  Cranial nerves II through XII grossly intact, motor grossly intact throughout PSYCH:  Cognitively intact, oriented to person place and time   EKG:  Atrial fibrillation, rate 67, axis within normal limits, intervals within normal limits, poor anterior R wave progression, no acute ST-T wave changes.   01/24/2014   ASSESSMENT AND PLAN  ATRIAL FIB:  For now this has been chronic and Angela Wong thinks the rate control is reasonable symptomatically. No change in therapy is indicated.  FATIGUE:  Angela Wong reports cold intolerance unintentional weight loss among other things. Though her TSH was normal I will check a T3/T4.  HTN:  The blood pressure is at target. No change in medications is indicated. We will continue with therapeutic lifestyle changes (TLC).  DYSPNEA:  I will be checking an echocardiogram and a BNP level.

## 2014-01-24 NOTE — Patient Instructions (Signed)
Your physician recommends that you schedule a follow-up appointment in: 2 Months  Your physician has requested that you have an echocardiogram. Echocardiography is a painless test that uses sound waves to create images of your heart. It provides your doctor with information about the size and shape of your heart and how well your heart's chambers and valves are working. This procedure takes approximately one hour. There are no restrictions for this procedure.  Your physician recommends that you return for lab work in: T4, T3, BMP

## 2014-01-27 ENCOUNTER — Ambulatory Visit (HOSPITAL_COMMUNITY)
Admission: RE | Admit: 2014-01-27 | Discharge: 2014-01-27 | Disposition: A | Discharge status: Home / Self Care | Payer: Medicare Other | Source: Ambulatory Visit | Attending: Cardiology | Admitting: Cardiology

## 2014-01-27 DIAGNOSIS — Z8249 Family history of ischemic heart disease and other diseases of the circulatory system: Secondary | ICD-10-CM | POA: Diagnosis not present

## 2014-01-27 DIAGNOSIS — R0600 Dyspnea, unspecified: Secondary | ICD-10-CM

## 2014-01-27 DIAGNOSIS — I4891 Unspecified atrial fibrillation: Secondary | ICD-10-CM | POA: Insufficient documentation

## 2014-01-27 DIAGNOSIS — I359 Nonrheumatic aortic valve disorder, unspecified: Secondary | ICD-10-CM

## 2014-01-27 DIAGNOSIS — I4819 Other persistent atrial fibrillation: Secondary | ICD-10-CM

## 2014-01-27 DIAGNOSIS — I10 Essential (primary) hypertension: Secondary | ICD-10-CM

## 2014-01-27 NOTE — Progress Notes (Signed)
2D Echocardiogram Complete.  01/27/2014   Angela Wong, RDCS  

## 2014-01-28 ENCOUNTER — Ambulatory Visit: Payer: Medicare Other | Admitting: Internal Medicine

## 2014-01-31 ENCOUNTER — Encounter: Payer: Self-pay | Admitting: *Deleted

## 2014-01-31 ENCOUNTER — Ambulatory Visit (INDEPENDENT_AMBULATORY_CARE_PROVIDER_SITE_OTHER): Payer: Medicare Other | Admitting: Pharmacist

## 2014-01-31 ENCOUNTER — Other Ambulatory Visit: Payer: Self-pay | Admitting: *Deleted

## 2014-01-31 DIAGNOSIS — Z7901 Long term (current) use of anticoagulants: Secondary | ICD-10-CM | POA: Diagnosis not present

## 2014-01-31 DIAGNOSIS — I480 Paroxysmal atrial fibrillation: Secondary | ICD-10-CM

## 2014-01-31 DIAGNOSIS — I4819 Other persistent atrial fibrillation: Secondary | ICD-10-CM

## 2014-01-31 DIAGNOSIS — I481 Persistent atrial fibrillation: Secondary | ICD-10-CM | POA: Diagnosis not present

## 2014-01-31 LAB — POCT INR: INR: 2.1

## 2014-01-31 MED ORDER — BUMETANIDE 0.5 MG PO TABS: 0.5000 mg | ORAL_TABLET | Freq: Every morning | ORAL | Status: DC

## 2014-01-31 MED ORDER — MONTELUKAST SODIUM 10 MG PO TABS: 10.0000 mg | ORAL_TABLET | Freq: Every day | ORAL | Status: DC

## 2014-01-31 MED ORDER — LOSARTAN POTASSIUM 100 MG PO TABS: 100.0000 mg | ORAL_TABLET | Freq: Every day | ORAL | Status: DC

## 2014-01-31 MED ORDER — WARFARIN SODIUM 1 MG PO TABS: 1.0000 mg | ORAL_TABLET | ORAL | Status: DC

## 2014-01-31 MED ORDER — TRAZODONE HCL 150 MG PO TABS: 150.0000 mg | ORAL_TABLET | Freq: Every day | ORAL | Status: DC

## 2014-01-31 MED ORDER — METRONIDAZOLE 1 % EX GEL: 1.0000 "application " | Freq: Every day | CUTANEOUS | Status: DC

## 2014-01-31 MED ORDER — ATENOLOL 50 MG PO TABS: 50.0000 mg | ORAL_TABLET | Freq: Every morning | ORAL | Status: DC

## 2014-01-31 MED ORDER — BUDESONIDE-FORMOTEROL FUMARATE 80-4.5 MCG/ACT IN AERO: INHALATION_SPRAY | RESPIRATORY_TRACT | Status: DC

## 2014-01-31 MED ORDER — CLONIDINE HCL 0.1 MG PO TABS: 0.1000 mg | ORAL_TABLET | Freq: Two times a day (BID) | ORAL | Status: DC

## 2014-01-31 MED ORDER — ATORVASTATIN CALCIUM 20 MG PO TABS: 20.0000 mg | ORAL_TABLET | Freq: Every day | ORAL | Status: DC

## 2014-01-31 MED ORDER — CYCLOSPORINE 0.05 % OP EMUL: 1.0000 [drp] | Freq: Two times a day (BID) | OPHTHALMIC | Status: DC

## 2014-01-31 MED ORDER — LEVOTHYROXINE SODIUM 100 MCG PO TABS: 100.0000 ug | ORAL_TABLET | Freq: Every morning | ORAL | Status: DC

## 2014-01-31 MED ORDER — RANITIDINE HCL 150 MG PO TABS: ORAL_TABLET | ORAL | Status: DC

## 2014-01-31 NOTE — Patient Instructions (Signed)
Anticoagulation Dose Instructions as of 01/31/2014      Angela Wong Tue Wed Thu Fri Sat   New Dose 2 mg 2 mg 2 mg 2 mg 2 mg 2 mg 4 mg    Description        Continue current warfarin dose to 2mg  Sunday through Friday and 4mg  on saturdays only.     INR was 2.1 today

## 2014-02-02 ENCOUNTER — Other Ambulatory Visit: Payer: Self-pay | Admitting: Internal Medicine

## 2014-02-02 DIAGNOSIS — R0600 Dyspnea, unspecified: Secondary | ICD-10-CM

## 2014-02-03 ENCOUNTER — Ambulatory Visit (INDEPENDENT_AMBULATORY_CARE_PROVIDER_SITE_OTHER)
Admission: RE | Admit: 2014-02-03 | Discharge: 2014-02-03 | Disposition: A | Discharge status: Home / Self Care | Payer: Medicare Other | Source: Ambulatory Visit | Attending: Internal Medicine | Admitting: Internal Medicine

## 2014-02-03 ENCOUNTER — Encounter: Payer: Self-pay | Admitting: Internal Medicine

## 2014-02-03 ENCOUNTER — Telehealth: Payer: Self-pay | Admitting: Cardiology

## 2014-02-03 ENCOUNTER — Ambulatory Visit (INDEPENDENT_AMBULATORY_CARE_PROVIDER_SITE_OTHER): Payer: Medicare Other | Admitting: Internal Medicine

## 2014-02-03 VITALS — BP 130/80 | HR 50 | Temp 98.5°F | Ht 64.75 in | Wt 150.0 lb

## 2014-02-03 DIAGNOSIS — R0600 Dyspnea, unspecified: Secondary | ICD-10-CM

## 2014-02-03 DIAGNOSIS — J984 Other disorders of lung: Secondary | ICD-10-CM | POA: Diagnosis not present

## 2014-02-03 LAB — PULMONARY FUNCTION TEST
DL/VA % PRED: 103 %
DL/VA: 5.04 ml/min/mmHg/L
DLCO UNC: 20.72 ml/min/mmHg
DLCO unc % pred: 82 %
FEF 25-75 Post: 2.28 L/sec
FEF 25-75 Pre: 2.07 L/sec
FEF2575-%CHANGE-POST: 10 %
FEF2575-%PRED-POST: 130 %
FEF2575-%Pred-Pre: 118 %
FEV1-%Change-Post: 5 %
FEV1-%Pred-Post: 101 %
FEV1-%Pred-Pre: 96 %
FEV1-PRE: 2.12 L
FEV1-Post: 2.23 L
FEV1FVC-%CHANGE-POST: 5 %
FEV1FVC-%Pred-Pre: 106 %
FEV6-%Change-Post: 0 %
FEV6-%PRED-PRE: 94 %
FEV6-%Pred-Post: 94 %
FEV6-Post: 2.64 L
FEV6-Pre: 2.65 L
FEV6FVC-%Pred-Post: 105 %
FEV6FVC-%Pred-Pre: 105 %
FVC-%Change-Post: 0 %
FVC-%PRED-PRE: 90 %
FVC-%Pred-Post: 90 %
FVC-POST: 2.64 L
FVC-Pre: 2.65 L
POST FEV6/FVC RATIO: 100 %
PRE FEV1/FVC RATIO: 80 %
Post FEV1/FVC ratio: 84 %
Pre FEV6/FVC Ratio: 100 %
RV % PRED: 72 %
RV: 1.68 L
TLC % PRED: 83 %
TLC: 4.3 L

## 2014-02-03 MED ORDER — ESOMEPRAZOLE MAGNESIUM 40 MG PO CPDR: DELAYED_RELEASE_CAPSULE | ORAL | Status: DC

## 2014-02-03 NOTE — Progress Notes (Signed)
Quick Note:  Spoke with pt and notified of results per Dr. Wert. Pt verbalized understanding and denied any questions.  ______ 

## 2014-02-03 NOTE — Progress Notes (Signed)
PFT done today. 

## 2014-02-03 NOTE — Progress Notes (Signed)
Subjective:    Patient ID: Angela Wong, female    DOB: 1940/05/28  MRN: [id number]    Brief patient profile:  31 yowf never smoker referred by Dr Angela Wong with [facility] in [place]  with dx of asthma vs copd (given this dx by [facility] doc) but pfts completely nl s before any bronchodilators 02/03/14.   History of Present Illness  12/13/2013 1st [facility] office visit/ Angela Wong   Chief Complaint  Patient presents with  . Pulmonary Consult    Referred by Dr Angela Wong. Pt states that she was dxed with exercise induced asthma in her 27's and "cold induced asthma" in her 60's. She states that she was also dxed with COPD also. She c/o DOE with walking to her mailbox for the past yr.    has  Been on symbicort for years with minimal need for rescue  but since  the spring of 2015 on symbicort 160 one bid vs 80 2bid with daily sob mailbox and back maybe 124ft esp if walks standing straight up or humid or cold - can do grocery store leaning on a cart Some coughing with talking,  No am exac or excess/ purulent sputum  sjogren's on 5 mg daily  rec  In the future may need to stop atenolol and replace it with bisoprolol 5 mg one half daily so we don't block your lung receptors to symbicort Change  symbicort to 80 Take 2 puffs first thing in am and then another 2 puffs about 12 hours later until return Continue nexium 40 mg   Take 30-60 min before first meal of the day and Zantac 150  mg one bedtime until return to office    GERD diet    02/03/2014 f/u ov/Angela Wong re: unexplained sob with nl pfts  Chief Complaint  Patient presents with  . Follow-up    PFT done today. She states that her breahting is better "sometimes". She states that her SOB seems worse with cold air. She has used rescue inhaler x 1 since the last visit.   typical good day more limited by knees before she is sob with desired activities  Bending over makes breathing worse   No obvious other patterns in day to  day or daytime variabilty or assoc chronic cough or cp or chest tightness, subjective wheeze overt sinus or hb symptoms. No unusual exp hx or h/o childhood pna/ asthma or knowledge of premature birth.  Sleeping ok without nocturnal  or early am exacerbation  of respiratory  c/o's or need for noct saba. Also denies any obvious fluctuation of symptoms with weather or environmental changes or other aggravating or alleviating factors except as outlined above   Current Medications, Allergies, Complete Past Medical History, Past Surgical History, Family History, and Social History were reviewed in [employer] record.  ROS  The following are not active complaints unless bolded sore throat, dysphagia, dental problems, itching, sneezing,  nasal congestion or excess/ purulent secretions, ear ache,   fever, chills, sweats, unintended wt loss, pleuritic or exertional cp, hemoptysis,  orthopnea pnd or leg swelling, presyncope, palpitations, heartburn, abdominal pain, anorexia, nausea, vomiting, diarrhea  or change in bowel or urinary habits, change in stools or urine, dysuria,hematuria,  rash, arthralgias, visual complaints, headache, numbness weakness or ataxia or problems with walking or coordination,  change in mood/affect or memory.  Objective:   Physical Exam  amb wf nad  02/03/2014        150  Wt Readings from Last 3 Encounters:  12/13/13 150 lb (68.04 kg)  11/26/13 153 lb 14.4 oz (69.809 kg)  11/08/13 142 lb (64.411 kg)    Vital signs reviewed     HEENT: nl dentition, turbinates, and orophanx. Nl external ear canals without cough reflex   NECK :  without JVD/Nodes/TM/ nl carotid upstrokes bilaterally   LUNGS: no acc muscle use, clear to A and P bilaterally without cough on insp or exp maneuvers   CV:  RRR  no s3 or murmur or increase in P2, no edema   ABD:  soft and nontender with nl excursion in the supine position. No bruits  or organomegaly, bowel sounds nl  MS:  warm without deformities, calf tenderness, cyanosis or clubbing  SKIN: warm and dry without lesions    NEURO:  alert, approp, no deficits      Lab Results  Component Value Date   WBC 8.4 11/26/2013   HGB 14.2 11/26/2013   HCT 42.6 11/26/2013   MCV 91.7 11/26/2013      Chemistry      Component Value Date/Time   NA 142 11/26/2013 1221   NA 139 04/29/2012 1715   K 3.7 11/26/2013 1221   CL 100 11/26/2013 1221   CO2 27 11/26/2013 1221   BUN 11 11/26/2013 1221   BUN 16 04/29/2012 1715   CREATININE 0.82 11/26/2013 1221      Component Value Date/Time   CALCIUM 9.0 11/26/2013 1221   ALKPHOS 80 11/26/2013 1221   AST 14 11/26/2013 1221   ALT 17 11/26/2013 1221   BILITOT 0.9 11/26/2013 1221      Lab Results  Component Value Date   TSH 1.280 11/26/2013     No results found for: PROBNP   Lab Results  Component Value Date   ESRSEDRATE 5 04/29/2012       CXR PA and Lateral:   02/03/2014 :     I personally reviewed images and agree with radiology impression as follows:    There is mild scarring in the left base. Elsewhere lungs are clear. Heart is upper normal in size with pulmonary vascularity within normal limits. No adenopathy. No bone lesions    Assessment & Plan:

## 2014-02-03 NOTE — Patient Instructions (Addendum)
You don't have copd and never will - ok to use symbicort 80 up to 2 puffs every 12 hours but don't use if not helping your breathing.   Ok to add second dose of nexium 30 min before supper  Please remember to go to the lab department downstairs for your tests - we will call you with the results when they are available.    Pulmonary follow up is as needed at this point if feel like you are loosing ground with your breathing but much more likely to be due to your blood pressure and heart funciton so need careful follow up on these issues

## 2014-02-03 NOTE — Progress Notes (Signed)
Quick Note:  LMTCB ______ 

## 2014-02-03 NOTE — Telephone Encounter (Signed)
INFORMED PATIENT , DR HOCHREIN HAS NOT REVIEWED REPORT  PATIENT STATES [name] INFORMED HER UPPER CHAMBER OF HER HEART WAS ENLARGE. RN INFORMED HER THAT ONCE DR Thayer REVIEWED  WILL CONTACTHER

## 2014-02-03 NOTE — Telephone Encounter (Signed)
Pt had echo last Thursday,she still have not received the results.

## 2014-02-04 ENCOUNTER — Encounter: Payer: Self-pay | Admitting: Internal Medicine

## 2014-02-04 NOTE — Assessment & Plan Note (Addendum)
--.  12/13/2013 spirometry wnl   - 12/13/2013  Walked RA x 3 laps @ 185 ft each stopped due to end of study, some sob and chest tightness, no desat @ nl pace -  02/03/2014 pfts completely wnl  -  The proper method of use, as well as anticipated side effects, of a metered-dose inhaler are discussed and demonstrated to the patient. Improved effectiveness after extensive coaching during this visit to a level of approximately  75%   I had an extended summary discussion with the patient reviewing all relevant studies completed to date and  lasting 15 to 20 minutes of a 25 minute visit on the following ongoing concerns:   1) she could have mild asthma which has been fully reversed at this point with no impact on her symptoms ? Whether really symbicort dep - strongly doubt , but no evidence at all to support clinically sign copd so ok to just use the symbicort "prn" at this point 2) she does have abn echo from 01/27/14 and will be back in touch with Dr Percival Spanish to arrange f/u and I have no problem with use of less selective beta blockers in this setting but would avoid high doses of any BB if breathing/coughing/ wheezing develop as she could still have occult asthma. 3) pulmonary f/u is prn   See instructions for specific recommendations which were reviewed directly with the patient who was given a copy with highlighter outlining the key components.

## 2014-02-09 ENCOUNTER — Ambulatory Visit (INDEPENDENT_AMBULATORY_CARE_PROVIDER_SITE_OTHER): Payer: Medicare Other | Admitting: Cardiology

## 2014-02-09 ENCOUNTER — Other Ambulatory Visit (INDEPENDENT_AMBULATORY_CARE_PROVIDER_SITE_OTHER): Payer: Medicare Other

## 2014-02-09 ENCOUNTER — Encounter: Payer: Self-pay | Admitting: Cardiology

## 2014-02-09 VITALS — BP 124/70 | HR 60 | Ht 64.75 in | Wt 147.0 lb

## 2014-02-09 DIAGNOSIS — I4819 Other persistent atrial fibrillation: Secondary | ICD-10-CM

## 2014-02-09 DIAGNOSIS — R0602 Shortness of breath: Secondary | ICD-10-CM | POA: Diagnosis not present

## 2014-02-09 DIAGNOSIS — I481 Persistent atrial fibrillation: Secondary | ICD-10-CM | POA: Diagnosis not present

## 2014-02-09 DIAGNOSIS — I420 Dilated cardiomyopathy: Secondary | ICD-10-CM | POA: Diagnosis not present

## 2014-02-09 DIAGNOSIS — R931 Abnormal findings on diagnostic imaging of heart and coronary circulation: Secondary | ICD-10-CM | POA: Diagnosis not present

## 2014-02-09 MED ORDER — CARVEDILOL 12.5 MG PO TABS: 12.5000 mg | ORAL_TABLET | Freq: Two times a day (BID) | ORAL | Status: DC

## 2014-02-09 NOTE — Patient Instructions (Addendum)
Please stop Atenolol. Please start Carvedilol 12.5 mg one twice a day. Continue all other medications as listed.  Please have blood work today at [facility] ([facility]/BNP)  Your physician has requested that you have a lexiscan myoview. For further information please visit [email or web address]. Please follow instruction sheet, as given.  Follow up in 2 months with Dr Percival Spanish in [place].  Thank you for choosing [facility]!!

## 2014-02-09 NOTE — Progress Notes (Signed)
HPI The patient presents as a new patient. She had a long medical history. She's moving here from [place] permanently. She has had long-standing atrial fibrillation. She's been managed with rate control and anticoagulation. She apparently chose not to have cardioversion in the past.      She is referred for followup. She has multiple complaints and has recently seen a pulmonologist for dyspnea. She's had fatigue and I did review labs. TSH and CBC were normal. She does have chronic pain from multiple sources. She does eat well. He notices sometimes her heart slow and sometimes fast but it's been fairly steady. In the past 2 slow and she was taking digoxin. He tolerates anticoagulation. She denies any presyncope or syncope. She has no new chest pressure, neck or arm discomfort. She has no PND or orthopnea. She does have some mild lower extremity swelling which seems to be chronic.  Allergies  Allergen Reactions  . Cephalosporins Hives and Shortness Of Breath  . Ciprofloxacin Hives and Shortness Of Breath  . Doxycycline Hives and Shortness Of Breath  . Horse-Derived Products Anaphylaxis  . Ketek [Telithromycin] Palpitations    Chest discomfort  . Nitrofuran Derivatives Anaphylaxis and Hives  . Nitrous Oxide Nausea And Vomiting    Severe due to Sjogrens (Auto-Immune Disease)  . Other Anaphylaxis    ALLERGY TO HORSE SERUM  . Penicillins Hives and Shortness Of Breath  . Sulfa Antibiotics Hives and Shortness Of Breath  . Talwin [Pentazocine] Other (See Comments)    Altered Mental Status   . Amlodipine Swelling  . Codeine Nausea Only  . Cymbalta [Duloxetine Hcl] Swelling  . Diovan [Valsartan] Swelling  . Lisinopril Swelling  . Tramadol Nausea Only  . Zoloft [Sertraline Hcl]   . Trovan [Alatrofloxacin] Palpitations and Other (See Comments)    Chest pain, dizziness, irregular pulse    Current Outpatient Prescriptions  Medication Sig Dispense Refill  . albuterol (PROVENTIL  HFA;VENTOLIN HFA) 108 (90 BASE) MCG/ACT inhaler Inhale 2 puffs into the lungs every 6 (six) hours as needed for wheezing.    Marland Kitchen atenolol (TENORMIN) 50 MG tablet Take 1 tablet (50 mg total) by mouth every morning. 90 tablet 1  . atorvastatin (LIPITOR) 20 MG tablet Take 1 tablet (20 mg total) by mouth at bedtime. 90 tablet 1  . B Complex-C (B-COMPLEX WITH VITAMIN C) tablet Take 1 tablet by mouth daily.    . budesonide-formoterol (SYMBICORT) 80-4.5 MCG/ACT inhaler Take 2 puffs first thing in am and then another 2 puffs about 12 hours later. (Patient taking differently: Use as needed for difficult breathing) 3 Inhaler 1  . bumetanide (BUMEX) 0.5 MG tablet Take 1 tablet (0.5 mg total) by mouth every morning. 90 tablet 1  . Cholecalciferol (VITAMIN D) 2000 UNITS CAPS Take 1 capsule by mouth every evening.     . cloNIDine (CATAPRES) 0.1 MG tablet Take 1 tablet (0.1 mg total) by mouth 2 (two) times daily. 180 tablet 1  . Cyanocobalamin (VITAMIN B 12 PO) Take 500 mg by mouth daily.    . cyclobenzaprine (FLEXERIL) 5 MG tablet Take 1 tablet (5 mg total) by mouth 3 (three) times daily as needed for muscle spasms. Take 1 or 2 tablets at bedtime as needed for restless leg syndrome 90 tablet 0  . cycloSPORINE (RESTASIS) 0.05 % ophthalmic emulsion Place 1 drop into both eyes 2 (two) times daily. 3 each 1  . diazepam (VALIUM) 5 MG tablet Take 1 tablet (5 mg total) by mouth every 6 (six)  hours as needed for anxiety. (Patient taking differently: Take 5 mg by mouth every 6 (six) hours as needed for muscle spasms. ) 30 tablet 0  . docusate sodium (COLACE) 100 MG capsule Take 300 mg by mouth daily.    Marland Kitchen esomeprazole (NEXIUM) 40 MG capsule Take 30- 60 min before your first and last meals of the day 90 capsule 3  . fexofenadine (ALLEGRA) 180 MG tablet Take 180 mg by mouth every morning.     . Iron Polysacch Cmplx-B12-FA (POLYSACCHARIDE IRON FORTE) 150-0.025-1 MG CAPS Take 1 tablet by mouth daily. 90 each 0  . levothyroxine  (SYNTHROID, LEVOTHROID) 100 MCG tablet Take 1 tablet (100 mcg total) by mouth every morning. 90 tablet 1  . losartan (COZAAR) 100 MG tablet Take 1 tablet (100 mg total) by mouth at bedtime. 90 tablet 1  . metroNIDAZOLE (METROGEL) 1 % gel Apply 1 application topically daily. 180 g 1  . montelukast (SINGULAIR) 10 MG tablet Take 1 tablet (10 mg total) by mouth at bedtime. 90 tablet 1  . OxyCODONE (OXYCONTIN) 10 mg T12A 12 hr tablet Take 1 tablet (10 mg total) by mouth every 12 (twelve) hours. (Patient taking differently: Take 10 mg by mouth as needed (sereve pain). ) 60 tablet 0  . predniSONE (DELTASONE) 5 MG tablet Take 1 tablet (5 mg total) by mouth daily. 90 tablet 0  . PREVIDENT 5000 DRY MOUTH 1.1 % GEL dental gel Place 1 application onto teeth as needed (as needed for dry mouth).     . ranitidine (ZANTAC) 150 MG tablet One at bedtime 90 tablet 1  . Simethicone (GAS-X EXTRA STRENGTH) 125 MG CAPS Take 1 capsule by mouth daily as needed (for relief).    . Tapentadol HCl (NUCYNTA ER) 100 MG TB12 Take 100 mg by mouth at bedtime as needed (for pain/sleep). 30 tablet 0  . traZODone (DESYREL) 150 MG tablet Take 1 tablet (150 mg total) by mouth at bedtime. 90 tablet 0  . warfarin (COUMADIN) 1 MG tablet Take 1 tablet (1 mg total) by mouth as directed. 2 tabs Mon-Fri and 4 tabs on Sat and Sun (Patient taking differently: Take 1 mg by mouth as directed. 2 tabs Mon-Fri and Sunday and 4 tabs Sat) 216 tablet 0   No current facility-administered medications for this visit.    Past Medical History  Diagnosis Date  . A-fib   . Sjogren's syndrome   . Fibromyalgia   . Raynaud's disease   . GERD (gastroesophageal reflux disease)   . RLS (restless legs syndrome)   . Osteoarthritis   . IBS (irritable bowel syndrome)   . COPD (chronic obstructive pulmonary disease)   . Ovarian cancer     lymph node removal with hysterectomy  . MVP (mitral valve prolapse)   . Asthma   . Cerebral vasculitis     Past  Surgical History  Procedure Laterality Date  . Appendectomy    . Abdominal hysterectomy    . Cataract extraction Left   . Breast surgery      Biopsy    ROS:  As stated in the HPI and negative for all other systems.  PHYSICAL EXAM BP 124/70 mmHg  Pulse 60  Ht 5' 4.75" (1.645 m)  Wt 147 lb (66.679 kg)  BMI 24.64 kg/m2  GENERAL:  Well appearing NECK:  No jugular venous distention, waveform within normal limits, carotid upstroke brisk and symmetric, no bruits, no thyromegaly LUNGS:  Clear to auscultation bilaterally BACK:  No CVA tenderness  CHEST:  Unremarkable HEART:  PMI not displaced or sustained,S1 and S2 within normal limits, no S3, no clicks, no rubs, no murmurs,  irregular ABD:  Flat, positive bowel sounds normal in frequency in pitch, no bruits, no rebound, no guarding, no midline pulsatile mass, no hepatomegaly, no splenomegaly EXT:  2 plus pulses throughout, no edema, no cyanosis no clubbing SKIN:  No rashes no nodules    ASSESSMENT AND PLAN  ATRIAL FIB:  For now this has been chronic and she thinks the rate control is reasonable symptomatically. No change in therapy is indicated.  FATIGUE:  TSH and T3/T4 were normal.  No further work up is planned. Marland Kitchen  HTN:  This is being managed in the context of treating his CHF.  DYSPNEA:  I will check a BNP  CARDIOMYOPATHY:  I will change to Coreg.  I suspect that this is nonischemic.  However, I will check a Lexiscan Myoview.  MUSCLE WEAKNESS:  I will order CK.

## 2014-02-10 LAB — BRAIN NATRIURETIC PEPTIDE: BNP: 284.3 pg/mL — AB (ref 0.0–100.0)

## 2014-02-16 ENCOUNTER — Telehealth (HOSPITAL_COMMUNITY): Payer: Self-pay

## 2014-02-16 NOTE — Telephone Encounter (Signed)
Encounter complete. 

## 2014-02-18 ENCOUNTER — Ambulatory Visit (HOSPITAL_COMMUNITY)
Admission: RE | Admit: 2014-02-18 | Discharge: 2014-02-18 | Disposition: A | Discharge status: Home / Self Care | Payer: Medicare Other | Source: Ambulatory Visit | Attending: Cardiology | Admitting: Cardiology

## 2014-02-18 DIAGNOSIS — Z8249 Family history of ischemic heart disease and other diseases of the circulatory system: Secondary | ICD-10-CM | POA: Diagnosis not present

## 2014-02-18 DIAGNOSIS — R5383 Other fatigue: Secondary | ICD-10-CM | POA: Insufficient documentation

## 2014-02-18 DIAGNOSIS — R002 Palpitations: Secondary | ICD-10-CM | POA: Insufficient documentation

## 2014-02-18 DIAGNOSIS — R0609 Other forms of dyspnea: Secondary | ICD-10-CM | POA: Diagnosis not present

## 2014-02-18 DIAGNOSIS — R079 Chest pain, unspecified: Secondary | ICD-10-CM | POA: Insufficient documentation

## 2014-02-18 DIAGNOSIS — I481 Persistent atrial fibrillation: Secondary | ICD-10-CM | POA: Diagnosis not present

## 2014-02-18 DIAGNOSIS — E785 Hyperlipidemia, unspecified: Secondary | ICD-10-CM | POA: Diagnosis not present

## 2014-02-18 DIAGNOSIS — R931 Abnormal findings on diagnostic imaging of heart and coronary circulation: Secondary | ICD-10-CM | POA: Diagnosis not present

## 2014-02-18 DIAGNOSIS — R42 Dizziness and giddiness: Secondary | ICD-10-CM | POA: Diagnosis not present

## 2014-02-18 DIAGNOSIS — I4819 Other persistent atrial fibrillation: Secondary | ICD-10-CM

## 2014-02-18 DIAGNOSIS — I10 Essential (primary) hypertension: Secondary | ICD-10-CM | POA: Insufficient documentation

## 2014-02-18 MED ORDER — TECHNETIUM TC 99M SESTAMIBI GENERIC - CARDIOLITE
30.4000 | Freq: Once | INTRAVENOUS | Status: AC | PRN
  Administered 2014-02-18: 30 via INTRAVENOUS

## 2014-02-18 MED ORDER — AMINOPHYLLINE 25 MG/ML IV SOLN
75.0000 mg | Freq: Once | INTRAVENOUS | Status: AC
  Administered 2014-02-18: 75 mg via INTRAVENOUS

## 2014-02-18 MED ORDER — REGADENOSON 0.4 MG/5ML IV SOLN: 0.4000 mg | Freq: Once | INTRAVENOUS | Status: DC

## 2014-02-18 MED ORDER — REGADENOSON 0.4 MG/5ML IV SOLN
0.4000 mg | Freq: Once | INTRAVENOUS | Status: AC
  Administered 2014-02-18: 0.4 mg via INTRAVENOUS

## 2014-02-18 MED ORDER — AMINOPHYLLINE 25 MG/ML IV SOLN: 75.0000 mg | Freq: Once | INTRAVENOUS | Status: DC

## 2014-02-18 MED ORDER — TECHNETIUM TC 99M SESTAMIBI GENERIC - CARDIOLITE
10.8000 | Freq: Once | INTRAVENOUS | Status: AC | PRN
  Administered 2014-02-18: 11 via INTRAVENOUS

## 2014-02-18 NOTE — Procedures (Addendum)
[facility] [address] [place] [address] [phone]  Cardiology Nuclear Med Study  Angela Wong is a 74 y.o. female     MRN : [id number]     DOB: [birth year]  Procedure Date: 02/18/2014  Nuclear Med Background Indication for Stress Test:  Evaluation for Ischemia History:  Asthma, COPD, MVP and Congestive dialated cardiomyopathy;CHF;Raynauds;Last NUC MPI in 2011 in [place] at [facility]. Cardiac Risk Factors: Family History - CAD, Hypertension and Lipids  Symptoms:  Chest Pain, Dizziness, DOE, Fatigue and Palpitations   Nuclear Pre-Procedure Caffeine/Decaff Intake:  7:00pm NPO After: 5:00am   IV Site: R Forearm  IV 0.9% NS with Angio Cath:  22g  Chest Size (in):  n/a IV Started by: Rolene Course, RN  Height: 5\' 5"  (1.651 m)  Cup Size: B  BMI:  Body mass index is 24.46 kg/(m^2). Weight:  147 lb (66.679 kg)   Tech Comments:  n/a    Nuclear Med Study 1 or 2 day study: 1 day  Stress Test Type:  Max Provider:  Minus Breeding, MD   Resting Radionuclide: Technetium 47m Sestamibi  Resting Radionuclide Dose: 10.8 mCi   Stress Radionuclide:  Technetium 42m Sestamibi  Stress Radionuclide Dose: 30.4 mCi           Stress Protocol Rest HR: 75 Stress HR: 96  Rest BP: 170/87 Stress BP: 152/62  Exercise Time (min): n/a METS: n/a   Predicted Max HR: 146 bpm % Max HR: 65.75 bpm Rate Pressure Product: 14592  Dose of Adenosine (mg):  n/a Dose of Lexiscan: 0.4 mg  Dose of Atropine (mg): n/a Dose of Dobutamine: n/a mcg/kg/min (at max HR)  Stress Test Technologist: Leane Para, CCT Nuclear Technologist: Imagene Riches, CNMT   Rest Procedure:  Myocardial perfusion imaging was performed at rest 45 minutes following the intravenous administration of Technetium 84m Sestamibi. Stress Procedure:  The patient received IV Lexiscan 0.4 mg over 15-seconds.  Technetium 79m Sestamibi injected Iv at  30-seconds. Patient experienced SOB, Headache, Stomach Cramp and late Chest tightness and 75 mg Aminophylline IV was administered. There were no significant changes with Lexiscan.  Quantitative spect images were obtained after a 45 minute delay.  Transient Ischemic Dilatation (Normal <1.22):  1.17  QGS EDV:  97 ml QGS ESV:  47 ml LV Ejection Fraction: 51%  Rest ECG: Atrial Fibrilliation  Stress ECG: No significant change from baseline ECG  QPS Raw Data Images:  Normal; no motion artifact; normal heart/lung ratio. Stress Images:  Normal homogeneous uptake in all areas of the myocardium. Rest Images:  Normal homogeneous uptake in all areas of the myocardium. Subtraction (SDS):  Normal  Impression Exercise Capacity:  Lexiscan with no exercise. BP Response:  Normal blood pressure response. Clinical Symptoms:  No significant symptoms noted. ECG Impression:  There are scattered PVCs. Comparison with Prior Nuclear Study: No previous nuclear study performed  Overall Impression:  Normal stress nuclear study.  LV Wall Motion:  NL LV Function; NL Wall Motion; LVEF 51%  Pixie Casino, MD, [place] Certified in Nuclear Cardiology Attending Cardiologist Williston C, MD  02/18/2014 1:14 PM

## 2014-03-01 DIAGNOSIS — M5032 Other cervical disc degeneration, mid-cervical region: Secondary | ICD-10-CM | POA: Diagnosis not present

## 2014-03-01 DIAGNOSIS — S43491A Other sprain of right shoulder joint, initial encounter: Secondary | ICD-10-CM | POA: Diagnosis not present

## 2014-03-01 DIAGNOSIS — S161XXA Strain of muscle, fascia and tendon at neck level, initial encounter: Secondary | ICD-10-CM | POA: Diagnosis not present

## 2014-03-01 DIAGNOSIS — I4891 Unspecified atrial fibrillation: Secondary | ICD-10-CM | POA: Diagnosis not present

## 2014-03-01 DIAGNOSIS — R51 Headache: Secondary | ICD-10-CM | POA: Diagnosis not present

## 2014-03-01 DIAGNOSIS — S0990XA Unspecified injury of head, initial encounter: Secondary | ICD-10-CM | POA: Diagnosis not present

## 2014-03-01 DIAGNOSIS — Z79899 Other long term (current) drug therapy: Secondary | ICD-10-CM | POA: Diagnosis not present

## 2014-03-01 DIAGNOSIS — Y92019 Unspecified place in single-family (private) house as the place of occurrence of the external cause: Secondary | ICD-10-CM | POA: Diagnosis not present

## 2014-03-01 DIAGNOSIS — S4991XA Unspecified injury of right shoulder and upper arm, initial encounter: Secondary | ICD-10-CM | POA: Diagnosis not present

## 2014-03-01 DIAGNOSIS — Z7901 Long term (current) use of anticoagulants: Secondary | ICD-10-CM | POA: Diagnosis not present

## 2014-03-01 DIAGNOSIS — S199XXA Unspecified injury of neck, initial encounter: Secondary | ICD-10-CM | POA: Diagnosis not present

## 2014-03-01 DIAGNOSIS — S42291A Other displaced fracture of upper end of right humerus, initial encounter for closed fracture: Secondary | ICD-10-CM | POA: Diagnosis not present

## 2014-03-01 DIAGNOSIS — S43401A Unspecified sprain of right shoulder joint, initial encounter: Secondary | ICD-10-CM | POA: Diagnosis not present

## 2014-03-01 DIAGNOSIS — W010XXA Fall on same level from slipping, tripping and stumbling without subsequent striking against object, initial encounter: Secondary | ICD-10-CM | POA: Diagnosis not present

## 2014-03-01 DIAGNOSIS — M542 Cervicalgia: Secondary | ICD-10-CM | POA: Diagnosis not present

## 2014-03-01 DIAGNOSIS — R030 Elevated blood-pressure reading, without diagnosis of hypertension: Secondary | ICD-10-CM | POA: Diagnosis not present

## 2014-03-01 DIAGNOSIS — S0083XA Contusion of other part of head, initial encounter: Secondary | ICD-10-CM | POA: Diagnosis not present

## 2014-03-01 DIAGNOSIS — S134XXA Sprain of ligaments of cervical spine, initial encounter: Secondary | ICD-10-CM | POA: Diagnosis not present

## 2014-03-02 ENCOUNTER — Encounter: Payer: Self-pay | Admitting: Family

## 2014-03-02 ENCOUNTER — Telehealth: Payer: Self-pay | Admitting: Cardiology

## 2014-03-02 ENCOUNTER — Telehealth: Payer: Self-pay | Admitting: Family Medicine

## 2014-03-02 ENCOUNTER — Ambulatory Visit (INDEPENDENT_AMBULATORY_CARE_PROVIDER_SITE_OTHER): Payer: Medicare Other | Admitting: Family

## 2014-03-02 VITALS — BP 151/84 | HR 88 | Temp 99.1°F | Ht 65.0 in | Wt 152.8 lb

## 2014-03-02 DIAGNOSIS — W19XXXD Unspecified fall, subsequent encounter: Secondary | ICD-10-CM | POA: Diagnosis not present

## 2014-03-02 DIAGNOSIS — S46912S Strain of unspecified muscle, fascia and tendon at shoulder and upper arm level, left arm, sequela: Secondary | ICD-10-CM | POA: Diagnosis not present

## 2014-03-02 DIAGNOSIS — Z09 Encounter for follow-up examination after completed treatment for conditions other than malignant neoplasm: Secondary | ICD-10-CM

## 2014-03-02 MED ORDER — ONDANSETRON HCL 4 MG PO TABS: 4.0000 mg | ORAL_TABLET | Freq: Three times a day (TID) | ORAL | Status: DC | PRN

## 2014-03-02 MED ORDER — OXYCODONE-ACETAMINOPHEN 5-325 MG PO TABS: 1.0000 | ORAL_TABLET | Freq: Four times a day (QID) | ORAL | Status: DC | PRN

## 2014-03-02 NOTE — Telephone Encounter (Signed)
Please call asap,, she thinks she can not take the Carvedilol 12.5 mg.She is having some side effects from it.

## 2014-03-02 NOTE — Progress Notes (Signed)
Subjective:    Patient ID: Angela Wong, female    DOB: 1940/08/08, 74 y.o.   MRN: [id number]  HPI Pt presents to the office for ED follow up. Pt fell on 03/01/14 and went to the [facility]. Pt states she got up in the middle of the night and fell on her knees then fell on her right shoulder and face. Pt had a CT of head and neck and x-ray shoulder. Both were negative.    Review of Systems  Constitutional: Negative.   HENT: Negative.   Eyes: Negative.   Respiratory: Negative.  Negative for shortness of breath.   Cardiovascular: Negative.  Negative for palpitations.  Gastrointestinal: Negative.   Endocrine: Negative.   Genitourinary: Negative.   Musculoskeletal: Negative.   Neurological: Negative.  Negative for headaches.  Hematological: Negative.   Psychiatric/Behavioral: Negative.   All other systems reviewed and are negative.      Objective:   Physical Exam  Constitutional: She is oriented to person, place, and time. She appears well-developed and well-nourished. No distress.  HENT:  Head: Normocephalic and atraumatic.  Right Ear: External ear normal.  Left Ear: External ear normal.  Nose: Nose normal.  Mouth/Throat: Oropharynx is clear and moist.  Eyes: Pupils are equal, round, and reactive to light.  Neck: Normal range of motion. Neck supple. No thyromegaly present.  Cardiovascular: Normal rate, regular rhythm, normal heart sounds and intact distal pulses.   No murmur heard. Pulmonary/Chest: Effort normal and breath sounds normal. No respiratory distress. She has no wheezes.  Abdominal: Soft. Bowel sounds are normal. She exhibits no distension. There is no tenderness.  Musculoskeletal: Normal range of motion. She exhibits no edema or tenderness.  Neurological: She is alert and oriented to person, place, and time. She has normal reflexes. No cranial nerve deficit.  Skin: Skin is warm and dry.  Psychiatric: She has a normal mood and affect. Her  behavior is normal. Judgment and thought content normal.  Vitals reviewed.    BP 151/84 mmHg  Pulse 88  Temp(Src) 99.1 F (37.3 C) (Oral)  Ht 5\' 5"  (1.651 m)  Wt 152 lb 12.8 oz (69.31 kg)  BMI 25.43 kg/m2      Assessment & Plan:  1. Hospital discharge follow-up - ondansetron (ZOFRAN) 4 MG tablet; Take 1 tablet (4 mg total) by mouth every 8 (eight) hours as needed for nausea or vomiting.  Dispense: 20 tablet; Refill: 0 - oxyCODONE-acetaminophen (ROXICET) 5-325 MG per tablet; Take 1 tablet by mouth every 6 (six) hours as needed for severe pain.  Dispense: 20 tablet; Refill: 0  2. Fall, subsequent encounter - ondansetron (ZOFRAN) 4 MG tablet; Take 1 tablet (4 mg total) by mouth every 8 (eight) hours as needed for nausea or vomiting.  Dispense: 20 tablet; Refill: 0 - oxyCODONE-acetaminophen (ROXICET) 5-325 MG per tablet; Take 1 tablet by mouth every 6 (six) hours as needed for severe pain.  Dispense: 20 tablet; Refill: 0  3. Shoulder strain, left, sequela - ondansetron (ZOFRAN) 4 MG tablet; Take 1 tablet (4 mg total) by mouth every 8 (eight) hours as needed for nausea or vomiting.  Dispense: 20 tablet; Refill: 0 - oxyCODONE-acetaminophen (ROXICET) 5-325 MG per tablet; Take 1 tablet by mouth every 6 (six) hours as needed for severe pain.  Dispense: 20 tablet; Refill: 0  Pt was given percocet at ED (4 tabs). Pt states it helped with pain. Pt given 20 tabs with Zofran to help with nausea. Pt told would not  refill.  [name], FNP

## 2014-03-02 NOTE — Patient Instructions (Signed)
Shoulder Sprain A shoulder sprain is the result of damage to the tough, fiber-like tissues (ligaments) that help hold your shoulder in place. The ligaments may be stretched or torn. Besides the main shoulder joint (the ball and socket), there are several smaller joints that connect the bones in this area. A sprain usually involves one of those joints. Most often it is the acromioclavicular (or AC) joint. That is the joint that connects the collarbone (clavicle) and the shoulder blade (scapula) at the top point of the shoulder blade (acromion). A shoulder sprain is a mild form of what is called a shoulder separation. Recovering from a shoulder sprain may take some time. For some, pain lingers for several months. Most people recover without long term problems. CAUSES   A shoulder sprain is usually caused by some kind of trauma. This might be:  Falling on an outstretched arm.  Being hit hard on the shoulder.  Twisting the arm.  Shoulder sprains are more likely to occur in people who:  Play sports.  Have balance or coordination problems. SYMPTOMS   Pain when you move your shoulder.  Limited ability to move the shoulder.  Swelling and tenderness on top of the shoulder.  Redness or warmth in the shoulder.  Bruising.  A change in the shape of the shoulder. DIAGNOSIS  Your healthcare provider may:  Ask about your symptoms.  Ask about recent activity that might have caused those symptoms.  Examine your shoulder. You may be asked to do simple exercises to test movement. The other shoulder will be examined for comparison.  Order some tests that provide a look inside the body. They can show the extent of the injury. The tests could include:  X-rays.  CT (computed tomography) scan.  MRI (magnetic resonance imaging) scan. RISKS AND COMPLICATIONS  Loss of full shoulder motion.  Ongoing shoulder pain. TREATMENT  How long it takes to recover from a shoulder sprain depends on how  severe it was. Treatment options may include:  Rest. You should not use the arm or shoulder until it heals.  Ice. For 2 or 3 days after the injury, put an ice pack on the shoulder up to 4 times a day. It should stay on for 15 to 20 minutes each time. Wrap the ice in a towel so it does not touch your skin.  Over-the-counter medicine to relieve pain.  A sling or brace. This will keep the arm still while the shoulder is healing.  Physical therapy or rehabilitation exercises. These will help you regain strength and motion. Ask your healthcare provider when it is OK to begin these exercises.  Surgery. The need for surgery is rare with a sprained shoulder, but some people may need surgery to keep the joint in place and reduce pain. HOME CARE INSTRUCTIONS   Ask your healthcare provider about what you should and should not do while your shoulder heals.  Make sure you know how to apply ice to the correct area of your shoulder.  Talk with your healthcare provider about which medications should be used for pain and swelling.  If rehabilitation therapy will be needed, ask your healthcare provider to refer you to a therapist. If it is not recommended, then ask about at-home exercises. Find out when exercise should begin. SEEK MEDICAL CARE IF:  Your pain, swelling, or redness at the joint increases. SEEK IMMEDIATE MEDICAL CARE IF:   You have a fever.  You cannot move your arm or shoulder. Document Released: 05/19/2008 Document  Revised: 03/25/2011 Document Reviewed: 05/19/2008 Exit[employer] Patient Information 2015 [place], [place]. This information is not intended to replace advice given to you by your health care provider. Make sure you discuss any questions you have with your health care provider. Hematoma A hematoma is a collection of blood under the skin, in an organ, in a body space, in a joint space, or in other tissue. The blood can clot to form a lump that you can see and feel. The lump is often  firm and may sometimes become sore and tender. Most hematomas get better in a few days to weeks. However, some hematomas may be serious and require medical care. Hematomas can range in size from very small to very large. CAUSES  A hematoma can be caused by a blunt or penetrating injury. It can also be caused by spontaneous leakage from a blood vessel under the skin. Spontaneous leakage from a blood vessel is more likely to occur in older people, especially those taking blood thinners. Sometimes, a hematoma can develop after certain medical procedures. SIGNS AND SYMPTOMS   A firm lump on the body.  Possible pain and tenderness in the area.  Bruising.Blue, dark blue, purple-red, or yellowish skin may appear at the site of the hematoma if the hematoma is close to the surface of the skin. For hematomas in deeper tissues or body spaces, the signs and symptoms may be subtle. For example, an intra-abdominal hematoma may cause abdominal pain, weakness, fainting, and shortness of breath. An intracranial hematoma may cause a headache or symptoms such as weakness, trouble speaking, or a change in consciousness. DIAGNOSIS  A hematoma can usually be diagnosed based on your medical history and a physical exam. Imaging tests may be needed if your health care provider suspects a hematoma in deeper tissues or body spaces, such as the abdomen, head, or chest. These tests may include ultrasonography or a CT scan.  TREATMENT  Hematomas usually go away on their own over time. Rarely does the blood need to be drained out of the body. Large hematomas or those that may affect vital organs will sometimes need surgical drainage or monitoring. HOME CARE INSTRUCTIONS   Apply ice to the injured area:   Put ice in a plastic bag.   Place a towel between your skin and the bag.   Leave the ice on for 20 minutes, 2-3 times a day for the first 1 to 2 days.   After the first 2 days, switch to using warm compresses on the  hematoma.   Elevate the injured area to help decrease pain and swelling. Wrapping the area with an elastic bandage may also be helpful. Compression helps to reduce swelling and promotes shrinking of the hematoma. Make sure the bandage is not wrapped too tight.   If your hematoma is on a lower extremity and is painful, crutches may be helpful for a couple days.   Only take over-the-counter or prescription medicines as directed by your health care provider. SEEK IMMEDIATE MEDICAL CARE IF:   You have increasing pain, or your pain is not controlled with medicine.   You have a fever.   You have worsening swelling or discoloration.   Your skin over the hematoma breaks or starts bleeding.   Your hematoma is in your chest or abdomen and you have weakness, shortness of breath, or a change in consciousness.  Your hematoma is on your scalp (caused by a fall or injury) and you have a worsening headache or a change  in alertness or consciousness. MAKE SURE YOU:   Understand these instructions.  Will watch your condition.  Will get help right away if you are not doing well or get worse. Document Released: 08/15/2003 Document Revised: 09/02/2012 Document Reviewed: 06/10/2012 [facility] Patient Information 2015 [place], [place]. This information is not intended to replace advice given to you by your health care provider. Make sure you discuss any questions you have with your health care provider.

## 2014-03-02 NOTE — Telephone Encounter (Signed)
Pt states historically sensitive to med changes.  She was switched from Atenolol to Carvedilol. Since then reports "head a little foggier than usual", occ. dizziness, general complaints like hand tightness & arthritis flareups, dry eyes.   Noteably, had recent fall w/ shoulder contusion associated w/ dizziness. Denies syncope.  She reports BP 124/70, HR 41 the other day, prior to the fall.  However, reports BPs are typically "very high or very low".   Today's BP 164/77, HR 91.  She was hoping a dose change in the carvedilol would be advised as helpful. Told her w BP being up and down, not sure, would defer to Dr. Percival Spanish to give advice.

## 2014-03-02 NOTE — Telephone Encounter (Signed)
   Pt called back after hours.  She explained her situation again - re: recent fall, variable blood pressures, orthostatic lightheadedness - all since switching from atenolol 50 mg daily to coreg 12.5 mg bid.  She did report one pulse reading of 41 but nothing that low since.  She would like to cut her coreg in 1/2. I explained the reason for the switch from atenolol to coreg (cardiomyopathy) and advised that if she would like to cut her coreg dose in 1/2, she is welcome to.  I recommended that she continue to keep close tabs on her BP b/c if it starts trending up, we will either have to go back up on the coreg or titrate one of her other bp meds.  She verbalized understanding and was grateful for the call back.  Murray Hodgkins, NP 03/02/2014, 5:34 PM

## 2014-03-02 NOTE — Telephone Encounter (Signed)
Pt given appt this afternoon with Christy at 3:40.

## 2014-03-02 NOTE — Telephone Encounter (Signed)
Pt is calling back to see what Ovid Curd was able to find out about her reaction the her new heart medication.

## 2014-03-04 DIAGNOSIS — S4992XA Unspecified injury of left shoulder and upper arm, initial encounter: Secondary | ICD-10-CM | POA: Diagnosis not present

## 2014-03-04 DIAGNOSIS — M25511 Pain in right shoulder: Secondary | ICD-10-CM | POA: Diagnosis not present

## 2014-03-08 ENCOUNTER — Telehealth: Payer: Self-pay | Admitting: Cardiology

## 2014-03-08 NOTE — Telephone Encounter (Signed)
Spoke with pt, she has had several episodes since changing to the carvedilol. She was taken to [facility] hospital Monday evening because of a fall. This is the third fall since starting the new med. The carvedilol was cut in 1/2, she is still getting dizzy, joint pain, dry mouth and eyes and also fatigue. She has also swelling in her legs and feet, they do not seem to be getting better. She would like to talk to dr hochrein, she missed his call Friday. She wants the results of the stress echo and medication and possible changes. Will forward to dr hochrein

## 2014-03-08 NOTE — Telephone Encounter (Signed)
I will add her to be seen in the office when I am up in [facility] tomorrow.

## 2014-03-08 NOTE — Telephone Encounter (Signed)
Pt called in stating that since her decreased dosage in her Carvedilol her feet are still swelling, her hands are "tight" in the morning, and she is still experiencing slight dizziness. She would like to know if she needs to continue on this med or switch back to Atenolol. Please f/u  Thanks

## 2014-03-08 NOTE — Telephone Encounter (Signed)
appt scheduled for tomorrow at 10:45  She will call back if unable to make the appt.

## 2014-03-09 ENCOUNTER — Ambulatory Visit: Payer: Medicare Other | Admitting: Family Medicine

## 2014-03-09 ENCOUNTER — Ambulatory Visit (INDEPENDENT_AMBULATORY_CARE_PROVIDER_SITE_OTHER): Payer: Medicare Other | Admitting: Cardiology

## 2014-03-09 ENCOUNTER — Encounter: Payer: Self-pay | Admitting: Cardiology

## 2014-03-09 VITALS — BP 142/84 | HR 76 | Ht 64.75 in | Wt 152.0 lb

## 2014-03-09 DIAGNOSIS — I481 Persistent atrial fibrillation: Secondary | ICD-10-CM

## 2014-03-09 DIAGNOSIS — I4819 Other persistent atrial fibrillation: Secondary | ICD-10-CM

## 2014-03-09 MED ORDER — ATENOLOL 25 MG PO TABS: 25.0000 mg | ORAL_TABLET | Freq: Every day | ORAL | Status: DC

## 2014-03-09 NOTE — Progress Notes (Signed)
HPI The patient presents as a new patient. She had a long medical history. She has had long-standing atrial fibrillation. She's been managed with rate control and anticoagulation. She apparently chose not to have cardioversion in the past.    She does have chronic pain from multiple sources.   She did have a mildly reduced ejection fraction of 45-50%. I sent her for a stress perfusion study this demonstrated no evidence of ischemia. I switched her to carvedilol from her previous beta blocker cause of her mildly reduced ejection fraction. However, she called yesterday stating that she had been tolerating this. See her for follow-up of this and was added to the schedule today.  The patient had multiple problems following switched to carvedilol. She felt extremely exhausted. She had increased joint pain. She had dry eyes. Her teeth fell out. Her memory was worse. She had increased leg swelling. Her pulse was lower. She's had more dizziness. She had some presyncope but she did not report any syncope. She did report a fall however.  Allergies  Allergen Reactions  . Cephalosporins Hives and Shortness Of Breath  . Ciprofloxacin Hives and Shortness Of Breath  . Doxycycline Hives and Shortness Of Breath  . Horse-Derived Products Anaphylaxis  . Ketek [Telithromycin] Palpitations    Chest discomfort  . Nitrofuran Derivatives Anaphylaxis and Hives  . Nitrous Oxide Nausea And Vomiting    Severe due to Sjogrens (Auto-Immune Disease)  . Other Anaphylaxis    ALLERGY TO HORSE SERUM  . Penicillins Hives and Shortness Of Breath  . Sulfa Antibiotics Hives and Shortness Of Breath  . Talwin [Pentazocine] Other (See Comments)    Altered Mental Status   . Amlodipine Swelling  . Codeine Nausea Only  . Cymbalta [Duloxetine Hcl] Swelling  . Diovan [Valsartan] Swelling  . Lisinopril Swelling  . Tramadol Nausea Only  . Zoloft [Sertraline Hcl]   . Trovan [Alatrofloxacin] Palpitations and Other (See Comments)   Chest pain, dizziness, irregular pulse    Current Outpatient Prescriptions  Medication Sig Dispense Refill  . albuterol (PROVENTIL HFA;VENTOLIN HFA) 108 (90 BASE) MCG/ACT inhaler Inhale 2 puffs into the lungs every 6 (six) hours as needed for wheezing.    Marland Kitchen atorvastatin (LIPITOR) 20 MG tablet Take 1 tablet (20 mg total) by mouth at bedtime. 90 tablet 1  . B Complex-C (B-COMPLEX WITH VITAMIN C) tablet Take 1 tablet by mouth daily.    . budesonide-formoterol (SYMBICORT) 80-4.5 MCG/ACT inhaler Take 2 puffs first thing in am and then another 2 puffs about 12 hours later. (Patient taking differently: Use as needed for difficult breathing) 3 Inhaler 1  . bumetanide (BUMEX) 0.5 MG tablet Take 1 tablet (0.5 mg total) by mouth every morning. 90 tablet 1  . carvedilol (COREG) 12.5 MG tablet Take 1 tablet (12.5 mg total) by mouth 2 (two) times daily. (Patient taking differently: Take 12.5 mg by mouth 2 (two) times daily. Take 1/2 tablet twice daily) 60 tablet 6  . Cholecalciferol (VITAMIN D) 2000 UNITS CAPS Take 1 capsule by mouth every evening.     . cloNIDine (CATAPRES) 0.1 MG tablet Take 1 tablet (0.1 mg total) by mouth 2 (two) times daily. 180 tablet 1  . Cyanocobalamin (VITAMIN B 12 PO) Take 500 mg by mouth daily.    . cycloSPORINE (RESTASIS) 0.05 % ophthalmic emulsion Place 1 drop into both eyes 2 (two) times daily. 3 each 1  . docusate sodium (COLACE) 100 MG capsule Take 300 mg by mouth daily.    Marland Kitchen  esomeprazole (NEXIUM) 40 MG capsule Take 30- 60 min before your first and last meals of the day 90 capsule 3  . fexofenadine (ALLEGRA) 180 MG tablet Take 180 mg by mouth every morning.     . Iron Polysacch Cmplx-B12-FA (POLYSACCHARIDE IRON FORTE) 150-0.025-1 MG CAPS Take 1 tablet by mouth daily. 90 each 0  . levothyroxine (SYNTHROID, LEVOTHROID) 100 MCG tablet Take 1 tablet (100 mcg total) by mouth every morning. 90 tablet 1  . losartan (COZAAR) 100 MG tablet Take 1 tablet (100 mg total) by mouth at  bedtime. 90 tablet 1  . metroNIDAZOLE (METROGEL) 1 % gel Apply 1 application topically daily. 180 g 1  . montelukast (SINGULAIR) 10 MG tablet Take 1 tablet (10 mg total) by mouth at bedtime. 90 tablet 1  . OxyCODONE (OXYCONTIN) 10 mg T12A 12 hr tablet Take 1 tablet (10 mg total) by mouth every 12 (twelve) hours. (Patient taking differently: Take 10 mg by mouth as needed (sereve pain). ) 60 tablet 0  . predniSONE (DELTASONE) 5 MG tablet Take 1 tablet (5 mg total) by mouth daily. 90 tablet 0  . PREVIDENT 5000 DRY MOUTH 1.1 % GEL dental gel Place 1 application onto teeth as needed (as needed for dry mouth).     . ranitidine (ZANTAC) 150 MG tablet One at bedtime 90 tablet 1  . Simethicone (GAS-X EXTRA STRENGTH) 125 MG CAPS Take 1 capsule by mouth daily as needed (for relief).    . Tapentadol HCl (NUCYNTA ER) 100 MG TB12 Take 100 mg by mouth at bedtime as needed (for pain/sleep). 30 tablet 0  . traZODone (DESYREL) 150 MG tablet Take 1 tablet (150 mg total) by mouth at bedtime. 90 tablet 0  . warfarin (COUMADIN) 1 MG tablet Take 1 tablet (1 mg total) by mouth as directed. 2 tabs Mon-Fri and 4 tabs on Sat and Sun (Patient taking differently: Take 1 mg by mouth as directed. 2 tabs Mon-Fri and Sunday and 4 tabs Sat) 216 tablet 0  . cyclobenzaprine (FLEXERIL) 5 MG tablet Take 1 tablet (5 mg total) by mouth 3 (three) times daily as needed for muscle spasms. Take 1 or 2 tablets at bedtime as needed for restless leg syndrome (Patient not taking: Reported on 03/09/2014) 90 tablet 0  . diazepam (VALIUM) 5 MG tablet Take 1 tablet (5 mg total) by mouth every 6 (six) hours as needed for anxiety. (Patient not taking: Reported on 03/02/2014) 30 tablet 0  . ondansetron (ZOFRAN) 4 MG tablet Take 1 tablet (4 mg total) by mouth every 8 (eight) hours as needed for nausea or vomiting. (Patient not taking: Reported on 03/09/2014) 20 tablet 0  . oxyCODONE-acetaminophen (ROXICET) 5-325 MG per tablet Take 1 tablet by mouth every 6  (six) hours as needed for severe pain. (Patient not taking: Reported on 03/09/2014) 20 tablet 0   No current facility-administered medications for this visit.    Past Medical History  Diagnosis Date  . A-fib   . Sjogren's syndrome   . Fibromyalgia   . Raynaud's disease   . GERD (gastroesophageal reflux disease)   . RLS (restless legs syndrome)   . Osteoarthritis   . IBS (irritable bowel syndrome)   . COPD (chronic obstructive pulmonary disease)   . Ovarian cancer     lymph node removal with hysterectomy  . MVP (mitral valve prolapse)   . Asthma   . Cerebral vasculitis     Past Surgical History  Procedure Laterality Date  . Appendectomy    .  Abdominal hysterectomy    . Cataract extraction Left   . Breast surgery      Biopsy    ROS:  As stated in the HPI and negative for all other systems.  PHYSICAL EXAM BP 142/84 mmHg  Pulse 76  Ht 5' 4.75" (1.645 m)  Wt 152 lb (68.947 kg)  BMI 25.48 kg/m2  GENERAL:  Well appearing NECK:  No jugular venous distention, waveform within normal limits, carotid upstroke brisk and symmetric, no bruits, no thyromegaly LUNGS:  Clear to auscultation bilaterally BACK:  No CVA tenderness CHEST:  Unremarkable HEART:  PMI not displaced or sustained,S1 and S2 within normal limits, no S3, no clicks, no rubs, no murmurs,  irregular ABD:  Flat, positive bowel sounds normal in frequency in pitch, no bruits, no rebound, no guarding, no midline pulsatile mass, no hepatomegaly, no splenomegaly EXT:  2 plus pulses throughout, mod ankle edema, no cyanosis no clubbing SKIN:  No rashes no nodules    ASSESSMENT AND PLAN  ATRIAL FIB:  She will remain on her anticoagulation. I will assess rate control after switching her beta blocker as below.  FATIGUE:  TSH and T3/T4 were normal.  No further work up is planned. Further workup for her primary provider.  HTN:  This is being managed in the context of treating his CHF.  DYSPNEA:  This seems to be baseline.     CARDIOMYOPATHY:  This appears to be nonischemic by her stress perfusion study. She is very sensitive to medications. She's not tolerating carvedilol. I will switch her back to atenolol but because of some bradycardia at a lower dose than previous. In 2 weeks she will be followed with a Holter monitor.

## 2014-03-09 NOTE — Patient Instructions (Signed)
Please stop your Carvedilol and start Atenolol 25 mg a day. Continue all other medications as listed.  Please have a 24 hour holter monitor placed at [facility].  Follow up in 4 weeks with Dr Percival Spanish in [place].  Thank you for choosing [facility]!!

## 2014-03-10 DIAGNOSIS — M25511 Pain in right shoulder: Secondary | ICD-10-CM | POA: Diagnosis not present

## 2014-03-11 ENCOUNTER — Telehealth: Payer: Self-pay | Admitting: Cardiology

## 2014-03-11 ENCOUNTER — Encounter: Payer: Self-pay | Admitting: Family Medicine

## 2014-03-11 ENCOUNTER — Ambulatory Visit (INDEPENDENT_AMBULATORY_CARE_PROVIDER_SITE_OTHER): Payer: Medicare Other | Admitting: Family Medicine

## 2014-03-11 VITALS — BP 143/67 | HR 58 | Temp 96.3°F | Ht 64.75 in | Wt 155.0 lb

## 2014-03-11 DIAGNOSIS — M797 Fibromyalgia: Secondary | ICD-10-CM | POA: Insufficient documentation

## 2014-03-11 DIAGNOSIS — Z7901 Long term (current) use of anticoagulants: Secondary | ICD-10-CM

## 2014-03-11 DIAGNOSIS — J449 Chronic obstructive pulmonary disease, unspecified: Secondary | ICD-10-CM | POA: Insufficient documentation

## 2014-03-11 DIAGNOSIS — I341 Nonrheumatic mitral (valve) prolapse: Secondary | ICD-10-CM | POA: Insufficient documentation

## 2014-03-11 DIAGNOSIS — J45909 Unspecified asthma, uncomplicated: Secondary | ICD-10-CM

## 2014-03-11 DIAGNOSIS — I480 Paroxysmal atrial fibrillation: Secondary | ICD-10-CM

## 2014-03-11 DIAGNOSIS — I10 Essential (primary) hypertension: Secondary | ICD-10-CM | POA: Diagnosis not present

## 2014-03-11 DIAGNOSIS — K589 Irritable bowel syndrome without diarrhea: Secondary | ICD-10-CM | POA: Insufficient documentation

## 2014-03-11 DIAGNOSIS — I677 Cerebral arteritis, not elsewhere classified: Secondary | ICD-10-CM | POA: Insufficient documentation

## 2014-03-11 DIAGNOSIS — I481 Persistent atrial fibrillation: Secondary | ICD-10-CM | POA: Diagnosis not present

## 2014-03-11 DIAGNOSIS — J4540 Moderate persistent asthma, uncomplicated: Secondary | ICD-10-CM | POA: Insufficient documentation

## 2014-03-11 DIAGNOSIS — M1990 Unspecified osteoarthritis, unspecified site: Secondary | ICD-10-CM | POA: Insufficient documentation

## 2014-03-11 DIAGNOSIS — G2581 Restless legs syndrome: Secondary | ICD-10-CM | POA: Insufficient documentation

## 2014-03-11 DIAGNOSIS — I4819 Other persistent atrial fibrillation: Secondary | ICD-10-CM

## 2014-03-11 DIAGNOSIS — M3500 Sicca syndrome, unspecified: Secondary | ICD-10-CM | POA: Insufficient documentation

## 2014-03-11 DIAGNOSIS — C569 Malignant neoplasm of unspecified ovary: Secondary | ICD-10-CM | POA: Insufficient documentation

## 2014-03-11 DIAGNOSIS — I7300 Raynaud's syndrome without gangrene: Secondary | ICD-10-CM | POA: Insufficient documentation

## 2014-03-11 LAB — POCT INR: INR: 1.7

## 2014-03-11 NOTE — Progress Notes (Signed)
Subjective:    Patient ID: Angela Wong, female    DOB: 09-29-1940, 74 y.o.   MRN: [id number]  HPI 74 year old female with multiple medical problems. She fell several weeks ago injuring the right side of her body primarily her right shoulder. She has been seen by an orthopedist ordered an MRI which was done last night and she has a follow-up appointment with him. We talked about pain relief she has both a long-acting and short-acting opiate which she can take for pain. She also has a history of atrial fibrillation and has been started on carvedilol but did not tolerate that and now she is back on atenolol 25 mg. I suggested she break it in half and take it at 12 hour intervals since it's probably not a true 24-hour effect.  Her PT/INR today was 1.7 and we talked about increasing her Coumadin to 2 mg Monday through Friday and 4 mg on Saturday and Sunday. This was a schedule she had been on previously. We'll check again in 2 weeks  She was also seen recently by pulmonologist in [place] who told her that she did not have COPD so now she is only on the albuterol inhaler as needed for cold-induced asthma.  Patient Active Problem List   Diagnosis Date Noted  . Osteoarthritis   . Asthma   . Ovarian cancer   . COPD (chronic obstructive pulmonary disease)   . Fibromyalgia   . RLS (restless legs syndrome)   . Cerebral vasculitis   . Sjogren's syndrome   . Raynaud's disease   . MVP (mitral valve prolapse)   . IBS (irritable bowel syndrome)   . Congestive dilated cardiomyopathy 02/09/2014  . Dyspnea 12/13/2013  . Hyperlipidemia 11/26/2013  . Malaise and fatigue 11/26/2013  . Multiple drug allergies 11/02/2013  . History of ovarian cancer 10/01/2013  . GERD (gastroesophageal reflux disease) 10/01/2013  . Benign essential HTN 10/01/2013  . Atrial fibrillation 07/27/2013  . Long term (current) use of anticoagulants 07/27/2013   Outpatient Encounter Prescriptions as of 03/11/2014    Medication Sig  . albuterol (PROVENTIL HFA;VENTOLIN HFA) 108 (90 BASE) MCG/ACT inhaler Inhale 2 puffs into the lungs every 6 (six) hours as needed for wheezing.  Marland Kitchen atenolol (TENORMIN) 25 MG tablet Take 1 tablet (25 mg total) by mouth daily.  Marland Kitchen atorvastatin (LIPITOR) 20 MG tablet Take 1 tablet (20 mg total) by mouth at bedtime.  . B Complex-C (B-COMPLEX WITH VITAMIN C) tablet Take 1 tablet by mouth daily.  . budesonide-formoterol (SYMBICORT) 80-4.5 MCG/ACT inhaler Take 2 puffs first thing in am and then another 2 puffs about 12 hours later. (Patient taking differently: Use as needed for difficult breathing)  . bumetanide (BUMEX) 0.5 MG tablet Take 1 tablet (0.5 mg total) by mouth every morning.  . Cholecalciferol (VITAMIN D) 2000 UNITS CAPS Take 1 capsule by mouth every evening.   . cloNIDine (CATAPRES) 0.1 MG tablet Take 1 tablet (0.1 mg total) by mouth 2 (two) times daily.  . Cyanocobalamin (VITAMIN B 12 PO) Take 500 mg by mouth daily.  . cyclobenzaprine (FLEXERIL) 5 MG tablet Take 1 tablet (5 mg total) by mouth 3 (three) times daily as needed for muscle spasms. Take 1 or 2 tablets at bedtime as needed for restless leg syndrome  . cycloSPORINE (RESTASIS) 0.05 % ophthalmic emulsion Place 1 drop into both eyes 2 (two) times daily.  . diazepam (VALIUM) 5 MG tablet Take 1 tablet (5 mg total) by mouth every 6 (six) hours  as needed for anxiety.  . docusate sodium (COLACE) 100 MG capsule Take 300 mg by mouth daily.  Marland Kitchen esomeprazole (NEXIUM) 40 MG capsule Take 30- 60 min before your first and last meals of the day  . fexofenadine (ALLEGRA) 180 MG tablet Take 180 mg by mouth every morning.   . Iron Polysacch Cmplx-B12-FA (POLYSACCHARIDE IRON FORTE) 150-0.025-1 MG CAPS Take 1 tablet by mouth daily.  Marland Kitchen levothyroxine (SYNTHROID, LEVOTHROID) 100 MCG tablet Take 1 tablet (100 mcg total) by mouth every morning.  Marland Kitchen losartan (COZAAR) 100 MG tablet Take 1 tablet (100 mg total) by mouth at bedtime.  .  metroNIDAZOLE (METROGEL) 1 % gel Apply 1 application topically daily.  . montelukast (SINGULAIR) 10 MG tablet Take 1 tablet (10 mg total) by mouth at bedtime.  . ondansetron (ZOFRAN) 4 MG tablet Take 1 tablet (4 mg total) by mouth every 8 (eight) hours as needed for nausea or vomiting.  . OxyCODONE (OXYCONTIN) 10 mg T12A 12 hr tablet Take 1 tablet (10 mg total) by mouth every 12 (twelve) hours. (Patient taking differently: Take 10 mg by mouth as needed (sereve pain). )  . oxyCODONE-acetaminophen (ROXICET) 5-325 MG per tablet Take 1 tablet by mouth every 6 (six) hours as needed for severe pain.  . predniSONE (DELTASONE) 5 MG tablet Take 1 tablet (5 mg total) by mouth daily.  Marland Kitchen PREVIDENT 5000 DRY MOUTH 1.1 % GEL dental gel Place 1 application onto teeth as needed (as needed for dry mouth).   . ranitidine (ZANTAC) 150 MG tablet One at bedtime  . Simethicone (GAS-X EXTRA STRENGTH) 125 MG CAPS Take 1 capsule by mouth daily as needed (for relief).  . Tapentadol HCl (NUCYNTA ER) 100 MG TB12 Take 100 mg by mouth at bedtime as needed (for pain/sleep).  . traZODone (DESYREL) 150 MG tablet Take 1 tablet (150 mg total) by mouth at bedtime.  Marland Kitchen warfarin (COUMADIN) 1 MG tablet Take 1 tablet (1 mg total) by mouth as directed. 2 tabs Mon-Fri and 4 tabs on Sat and Sun (Patient taking differently: Take 1 mg by mouth as directed. 2 tabs Mon-Fri and Sunday and 4 tabs Sat)      Review of Systems  Constitutional: Negative.   HENT: Negative.   Respiratory: Negative.   Cardiovascular: Positive for palpitations.  Gastrointestinal: Negative.   Genitourinary: Negative.   Neurological: Positive for dizziness and light-headedness.  Psychiatric/Behavioral: Negative.        Objective:   Physical Exam  Constitutional: She is oriented to person, place, and time. She appears well-developed and well-nourished.  Cardiovascular: Normal rate.   Heart is irregular in rate is in the 50s on beta blocker  Pulmonary/Chest:  Effort normal and breath sounds normal.  Neurological: She is alert and oriented to person, place, and time.    BP 143/67 mmHg  Pulse 58  Temp(Src) 96.3 F (35.7 C) (Oral)  Ht 5' 4.75" (1.645 m)  Wt 155 lb (70.308 kg)  BMI 25.98 kg/m2        Assessment & Plan:  1. Persistent atrial fibrillation   2. Benign essential HTN   3. Asthma, unspecified asthma severity, uncomplicated   4. Paroxysmal atrial fibrillation  - POCT INR  5. Long term current use of anticoagulant therapy  - POCT   Wardell Honour MD

## 2014-03-11 NOTE — Telephone Encounter (Signed)
Pt. Informed of Dr. Cherlyn Cushing instructions

## 2014-03-11 NOTE — Telephone Encounter (Signed)
OK 

## 2014-03-11 NOTE — Telephone Encounter (Signed)
Returned call to patient she stated she saw PCP this morning.Stated he wanted her to take Atenolol 25 mg 1/2 tablet twice a day instead of 25 mg daily.Stated she wanted to make sure Dr.Hochrein ok with this.Message sent to [facility].

## 2014-03-11 NOTE — Telephone Encounter (Signed)
Please call,concerning her Atenolol.Her primary doctor wants her to take half in the morning and half at night. Dr Warren Lacy said to take 1 in the morning,

## 2014-03-14 ENCOUNTER — Other Ambulatory Visit: Payer: Self-pay | Admitting: Family Medicine

## 2014-03-17 DIAGNOSIS — S4992XD Unspecified injury of left shoulder and upper arm, subsequent encounter: Secondary | ICD-10-CM | POA: Diagnosis not present

## 2014-03-17 DIAGNOSIS — S46011A Strain of muscle(s) and tendon(s) of the rotator cuff of right shoulder, initial encounter: Secondary | ICD-10-CM | POA: Diagnosis not present

## 2014-03-23 ENCOUNTER — Encounter: Payer: Self-pay | Admitting: Family Medicine

## 2014-03-23 ENCOUNTER — Ambulatory Visit (INDEPENDENT_AMBULATORY_CARE_PROVIDER_SITE_OTHER): Payer: Medicare Other | Admitting: Family Medicine

## 2014-03-23 VITALS — BP 120/69 | HR 59 | Temp 97.1°F | Ht 64.75 in | Wt 153.0 lb

## 2014-03-23 DIAGNOSIS — I480 Paroxysmal atrial fibrillation: Secondary | ICD-10-CM

## 2014-03-23 DIAGNOSIS — Z7901 Long term (current) use of anticoagulants: Secondary | ICD-10-CM | POA: Diagnosis not present

## 2014-03-23 DIAGNOSIS — M75101 Unspecified rotator cuff tear or rupture of right shoulder, not specified as traumatic: Secondary | ICD-10-CM

## 2014-03-23 DIAGNOSIS — M75100 Unspecified rotator cuff tear or rupture of unspecified shoulder, not specified as traumatic: Secondary | ICD-10-CM | POA: Insufficient documentation

## 2014-03-23 LAB — POCT INR: INR: 2.9

## 2014-03-23 MED ORDER — NITROGLYCERIN 0.2 MG/HR TD PT24: MEDICATED_PATCH | TRANSDERMAL | Status: DC

## 2014-03-23 MED ORDER — ESOMEPRAZOLE MAGNESIUM 40 MG PO CPDR: 40.0000 mg | DELAYED_RELEASE_CAPSULE | Freq: Two times a day (BID) | ORAL | Status: DC

## 2014-03-23 NOTE — Progress Notes (Signed)
Subjective:    Patient ID: Angela Wong, female    DOB: [birth year], 74 y.o.   MRN: [id number]  HPI  74 year old female with multiple problems but who is here today for these reasons: Follow-up with her shoulder injury, follow-up with her pro time, for application of Holter monitor, and discussion about her blood pressure and pulse rate. Regarding her shoulder, MRI showed rotator cuff tear and she is scheduled for physical therapy. She would not be a good surgical candidate. Depending on the injury nitroglycerin patch may improve circulation and help healing and I suggested that we try that wall as we monitor her blood pressure. Toward that and, I will provide nitroglycerin 0.2 mg to use one third patch every 24 hours. We will do this in combination with physical therapy. This should help her pain especially nighttime pain. Regarding her pulse I had suggested breaking atenolol in half and using one half tablet every 12 hours. She thinks this is had a favorable response on her bradycardia. Her pro time and INR today is 2.9 so we will reduce dose of Coumadin to 2 mg every day except 4 mg on Saturday  Patient Active Problem List   Diagnosis Date Noted  . Osteoarthritis   . Asthma   . Ovarian cancer   . COPD (chronic obstructive pulmonary disease)   . Fibromyalgia   . RLS (restless legs syndrome)   . Cerebral vasculitis   . Sjogren's syndrome   . Raynaud's disease   . MVP (mitral valve prolapse)   . IBS (irritable bowel syndrome)   . Congestive dilated cardiomyopathy 02/09/2014  . Dyspnea 12/13/2013  . Hyperlipidemia 11/26/2013  . Malaise and fatigue 11/26/2013  . Multiple drug allergies 11/02/2013  . History of ovarian cancer 10/01/2013  . GERD (gastroesophageal reflux disease) 10/01/2013  . Benign essential HTN 10/01/2013  . Atrial fibrillation 07/27/2013  . Long term (current) use of anticoagulants 07/27/2013   Outpatient Encounter Prescriptions as of 03/23/2014  Medication Sig    . albuterol (PROVENTIL HFA;VENTOLIN HFA) 108 (90 BASE) MCG/ACT inhaler Inhale 2 puffs into the lungs every 6 (six) hours as needed for wheezing.  Marland Kitchen atenolol (TENORMIN) 25 MG tablet Take 1 tablet (25 mg total) by mouth daily. (Patient taking differently: Take 12.5 mg by mouth 2 (two) times daily. )  . B Complex-C (B-COMPLEX WITH VITAMIN C) tablet Take 1 tablet by mouth daily.  . budesonide-formoterol (SYMBICORT) 80-4.5 MCG/ACT inhaler Take 2 puffs first thing in am and then another 2 puffs about 12 hours later. (Patient taking differently: Use as needed for difficult breathing)  . bumetanide (BUMEX) 0.5 MG tablet Take 1 tablet (0.5 mg total) by mouth every morning.  . Cholecalciferol (VITAMIN D) 2000 UNITS CAPS Take 1 capsule by mouth every evening.   . cloNIDine (CATAPRES) 0.1 MG tablet Take 1 tablet (0.1 mg total) by mouth 2 (two) times daily.  . Cyanocobalamin (VITAMIN B 12 PO) Take 500 mg by mouth daily.  . cyclobenzaprine (FLEXERIL) 5 MG tablet Take 1 tablet (5 mg total) by mouth 3 (three) times daily as needed for muscle spasms. Take 1 or 2 tablets at bedtime as needed for restless leg syndrome  . cycloSPORINE (RESTASIS) 0.05 % ophthalmic emulsion Place 1 drop into both eyes 2 (two) times daily.  . diazepam (VALIUM) 5 MG tablet Take 1 tablet (5 mg total) by mouth every 6 (six) hours as needed for anxiety.  . docusate sodium (COLACE) 100 MG capsule Take 300 mg by mouth  daily.  . esomeprazole (NEXIUM) 40 MG capsule Take 1 capsule (40 mg total) by mouth 2 (two) times daily before a meal.  . fexofenadine (ALLEGRA) 180 MG tablet Take 180 mg by mouth every morning.   . Iron Polysacch Cmplx-B12-FA (POLYSACCHARIDE IRON FORTE) 150-0.025-1 MG CAPS Take 1 tablet by mouth daily.  Marland Kitchen levothyroxine (SYNTHROID, LEVOTHROID) 100 MCG tablet Take 1 tablet (100 mcg total) by mouth every morning.  Marland Kitchen losartan (COZAAR) 100 MG tablet Take 1 tablet (100 mg total) by mouth at bedtime.  . metroNIDAZOLE (METROGEL) 1 %  gel Apply 1 application topically daily.  . montelukast (SINGULAIR) 10 MG tablet Take 1 tablet (10 mg total) by mouth at bedtime.  . ondansetron (ZOFRAN) 4 MG tablet Take 1 tablet (4 mg total) by mouth every 8 (eight) hours as needed for nausea or vomiting.  . OxyCODONE (OXYCONTIN) 10 mg T12A 12 hr tablet Take 1 tablet (10 mg total) by mouth every 12 (twelve) hours. (Patient taking differently: Take 10 mg by mouth as needed (sereve pain). )  . oxyCODONE-acetaminophen (ROXICET) 5-325 MG per tablet Take 1 tablet by mouth every 6 (six) hours as needed for severe pain.  . predniSONE (DELTASONE) 5 MG tablet Take 1 tablet (5 mg total) by mouth daily.  Marland Kitchen PREVIDENT 5000 DRY MOUTH 1.1 % GEL dental gel Place 1 application onto teeth as needed (as needed for dry mouth).   . ranitidine (ZANTAC) 150 MG tablet One at bedtime  . Simethicone (GAS-X EXTRA STRENGTH) 125 MG CAPS Take 1 capsule by mouth daily as needed (for relief).  . Tapentadol HCl (NUCYNTA ER) 100 MG TB12 Take 100 mg by mouth at bedtime as needed (for pain/sleep).  . traZODone (DESYREL) 150 MG tablet Take 1 tablet by mouth at  bedtime  . warfarin (COUMADIN) 1 MG tablet Take as directed 2 tablets  by mouth daily Monday -  Friday and 4 tablets by  mouth daily on Saturday  Sunday  . atorvastatin (LIPITOR) 20 MG tablet Take 1 tablet (20 mg total) by mouth at bedtime.  . [DISCONTINUED] esomeprazole (NEXIUM) 40 MG capsule Take 1 capsule by mouth  daily before breakfast      Review of Systems  Constitutional: Negative.   Respiratory: Negative.   Cardiovascular: Negative.   Neurological: Negative.   Psychiatric/Behavioral: Negative.        Objective:   Physical Exam  Constitutional: She appears well-developed.  Cardiovascular: Normal rate.   Pulmonary/Chest: Effort normal.  Musculoskeletal:  Decreased abduction right shoulder  Psychiatric: She has a normal mood and affect. Her behavior is normal.     BP 120/69 mmHg  Pulse 59   Temp(Src) 97.1 F (36.2 C) (Oral)  Ht 5' 4.75" (1.645 m)  Wt 153 lb (69.4 kg)  BMI 25.65 kg/m2      Assessment & Plan:  1. Paroxysmal atrial fibrillation  Continue with rate controlling meds and coumadin - POCT INR  2. Long term current use of anticoagulant therapy  - POCT INR  3. Rotator cuff tear, right For PT; add NTG patch if effort to help healing and pain  Wardell Honour MD

## 2014-03-23 NOTE — Patient Instructions (Signed)
Anticoagulation Dose Instructions as of 03/23/2014      Dorene Grebe Tue Wed Thu Fri Sat   New Dose 2 mg 2 mg 2 mg 2 mg 2 mg 2 mg 4 mg    Description        Change warfarin dose to 2mg  Sunday through Friday and 4mg  on saturdays.     INR wasa 2.9 today

## 2014-03-24 ENCOUNTER — Telehealth: Payer: Self-pay | Admitting: Family

## 2014-03-24 NOTE — Telephone Encounter (Signed)
Spoke with patient.

## 2014-03-26 DIAGNOSIS — I4891 Unspecified atrial fibrillation: Secondary | ICD-10-CM | POA: Diagnosis not present

## 2014-03-27 DIAGNOSIS — S0093XA Contusion of unspecified part of head, initial encounter: Secondary | ICD-10-CM | POA: Diagnosis not present

## 2014-03-27 DIAGNOSIS — I4891 Unspecified atrial fibrillation: Secondary | ICD-10-CM | POA: Diagnosis not present

## 2014-03-27 DIAGNOSIS — M1990 Unspecified osteoarthritis, unspecified site: Secondary | ICD-10-CM | POA: Diagnosis not present

## 2014-03-27 DIAGNOSIS — R51 Headache: Secondary | ICD-10-CM | POA: Diagnosis not present

## 2014-03-27 DIAGNOSIS — J449 Chronic obstructive pulmonary disease, unspecified: Secondary | ICD-10-CM | POA: Diagnosis not present

## 2014-03-27 DIAGNOSIS — I658 Occlusion and stenosis of other precerebral arteries: Secondary | ICD-10-CM | POA: Diagnosis not present

## 2014-03-27 DIAGNOSIS — M797 Fibromyalgia: Secondary | ICD-10-CM | POA: Diagnosis not present

## 2014-03-27 DIAGNOSIS — I10 Essential (primary) hypertension: Secondary | ICD-10-CM | POA: Diagnosis not present

## 2014-03-27 DIAGNOSIS — Z7901 Long term (current) use of anticoagulants: Secondary | ICD-10-CM | POA: Diagnosis not present

## 2014-03-27 DIAGNOSIS — F329 Major depressive disorder, single episode, unspecified: Secondary | ICD-10-CM | POA: Diagnosis not present

## 2014-03-27 DIAGNOSIS — F42 Obsessive-compulsive disorder: Secondary | ICD-10-CM | POA: Diagnosis not present

## 2014-03-27 DIAGNOSIS — G2581 Restless legs syndrome: Secondary | ICD-10-CM | POA: Diagnosis not present

## 2014-03-27 DIAGNOSIS — Z90710 Acquired absence of both cervix and uterus: Secondary | ICD-10-CM | POA: Diagnosis not present

## 2014-03-27 DIAGNOSIS — W1800XA Striking against unspecified object with subsequent fall, initial encounter: Secondary | ICD-10-CM | POA: Diagnosis not present

## 2014-03-27 DIAGNOSIS — K219 Gastro-esophageal reflux disease without esophagitis: Secondary | ICD-10-CM | POA: Diagnosis not present

## 2014-03-27 DIAGNOSIS — Q871 Congenital malformation syndromes predominantly associated with short stature: Secondary | ICD-10-CM | POA: Diagnosis not present

## 2014-03-27 DIAGNOSIS — Z79899 Other long term (current) drug therapy: Secondary | ICD-10-CM | POA: Diagnosis not present

## 2014-03-27 DIAGNOSIS — S0990XA Unspecified injury of head, initial encounter: Secondary | ICD-10-CM | POA: Diagnosis not present

## 2014-03-27 DIAGNOSIS — K589 Irritable bowel syndrome without diarrhea: Secondary | ICD-10-CM | POA: Diagnosis not present

## 2014-03-29 ENCOUNTER — Ambulatory Visit: Payer: Medicare Other | Attending: Orthopedic Surgery | Admitting: Physical Therapy

## 2014-03-29 DIAGNOSIS — M25511 Pain in right shoulder: Secondary | ICD-10-CM | POA: Diagnosis not present

## 2014-03-29 DIAGNOSIS — M25611 Stiffness of right shoulder, not elsewhere classified: Secondary | ICD-10-CM | POA: Insufficient documentation

## 2014-03-29 NOTE — Therapy (Signed)
[facility] [place], [place], [address] Phone: [phone]   Fax:  [phone]  Physical Therapy Evaluation  Patient Details  Name: Angela Wong MRN: [id number] Date of Birth: 01/19/1940 Referring Provider:  Latanya Maudlin, MD  Encounter Date: 03/29/2014      PT End of Session - 03/29/14 0955    Visit Number 1   Number of Visits 12   Date for PT Re-Evaluation 05/10/14   PT Start Time 4481   PT Stop Time 8563   PT Time Calculation (min) 51 min      Past Medical History  Diagnosis Date  . A-fib   . Sjogren's syndrome   . Raynaud's disease   . RLS (restless legs syndrome)   . IBS (irritable bowel syndrome)   . MVP (mitral valve prolapse)   . Cerebral vasculitis   . Osteoarthritis   . Asthma   . Ovarian cancer     lymph node removal with hysterectomy  . COPD (chronic obstructive pulmonary disease)   . GERD (gastroesophageal reflux disease)   . Fibromyalgia     Past Surgical History  Procedure Laterality Date  . Appendectomy    . Abdominal hysterectomy    . Cataract extraction Left   . Breast surgery      Biopsy    There were no vitals filed for this visit.  Visit Diagnosis:  Right shoulder pain - Plan: PT plan of care cert/re-cert  Shoulder stiffness, right - Plan: PT plan of care cert/re-cert      Subjective Assessment - 03/29/14 0957    Symptoms [place] past evening got tangled up in dog leash while walking them.   Pain Score 4    Pain Location Shoulder   Pain Orientation Right   Pain Descriptors / Indicators Aching;Constant   Pain Type Chronic pain   Pain Onset 1 to 4 weeks ago   Pain Frequency Constant   Aggravating Factors  Certain movements of right shoulder.   Pain Relieving Factors Rest.   Multiple Pain Sites No            [facility] PT Assessment - 03/29/14 0001    Assessment   Medical Diagnosis Acute pain of right shoulder   Onset Date 02/28/14   Precautions   Precautions --   Please stand by pt due to fall history.   Balance Screen   Has the patient fallen in the past 6 months Yes   How many times? 2  Tangled in dog leash.  Medication was incorrect. Corrected.   Has the patient had a decrease in activity level because of a fear of falling?  Yes   Is the patient reluctant to leave their home because of a fear of falling?  No   Posture/Postural Control   Posture Comments Right shoulder elevation.   ROM / Strength   AROM / PROM / Strength AROM;Strength   AROM   Overall AROM Comments In supine flexion= 95 egrees and ER= 52 degrees.   Strength   Overall Strength Comments Flexion= 3-/5; IR/ER= 3+ to 4-/5 and abduction= 2+/5.   Palpation   Palpation --  Referred pain to rt mid deltoid and tender UT.   Special Tests    Special Tests --  Normal bilateral UE DTR's.                   [facility] Adult PT Treatment/Exercise - 03/29/14 0001    Modalities   Modalities Electrical Stimulation  Acupuncturist Location Right shoulder   Electrical Stimulation Parameters IFC 80-150 HZ at 100% scan   Electrical Stimulation Goals Pain                  PT Short Term Goals - 04-02-14 1033    PT SHORT TERM GOAL #1   Title Ind. with HP.   Time 6   Period Weeks   Status New   PT SHORT TERM GOAL #2   Title Active right shoulder flexion to 145 degrees so the patient can easily reach overhead.   Time 6   Period Weeks   Status New   PT SHORT TERM GOAL #3   Title Active ER to 70 degrees+ to allow for easily donning/doffing of apparel   Time 6   Status New                  Plan - 04/02/2014 9892    Clinical Impression Statement The patient fell while walking her dog and fell.  Her resting pain-level is a 3-4/10 and up to 1-1/94 with certain right shoulder motions.  She reports lose of mobility in her right shoulder and she is unable to sleep on her right houlder.   Pt will benefit from skilled therapeutic  intervention in order to improve on the following deficits Pain;Decreased strength   Rehab Potential Good   PT Duration 6 weeks  12 visits.   PT Treatment/Interventions Medical illustrator education;Therapeutic exercise;Passive range of motion;Ultrasound   PT Next Visit Plan Right shoulder ROM; supine cane exercises; UE Ranger; pulleys; modalities and STW/M.   Consulted and Agree with Plan of Care Patient          G-Codes - 02-Apr-2014 1147    Functional Assessment Tool Used FOTO   Functional Limitation Mobility: Walking and moving around   Mobility: Walking and Moving Around Current Status [phone]) At least 60 percent but less than 80 percent impaired, limited or restricted   Mobility: Walking and Moving Around Goal Status [phone]) At least 20 percent but less than 40 percent impaired, limited or restricted       Problem List Patient Active Problem List   Diagnosis Date Noted  . Rotator cuff tear 03/23/2014  . Osteoarthritis   . Asthma   . Ovarian cancer   . COPD (chronic obstructive pulmonary disease)   . Fibromyalgia   . RLS (restless legs syndrome)   . Cerebral vasculitis   . Sjogren's syndrome   . Raynaud's disease   . MVP (mitral valve prolapse)   . IBS (irritable bowel syndrome)   . Congestive dilated cardiomyopathy 02/09/2014  . Dyspnea 12/13/2013  . Hyperlipidemia 11/26/2013  . Malaise and fatigue 11/26/2013  . Multiple drug allergies 11/02/2013  . History of ovarian cancer 10/01/2013  . GERD (gastroesophageal reflux disease) 10/01/2013  . Benign essential HTN 10/01/2013  . Atrial fibrillation 07/27/2013  . Long term (current) use of anticoagulants 07/27/2013    APPLEGATE, [place] MPT 02-Apr-2014, 11:55 AM  [facility] [place], [place], [address] Phone: [phone]   Fax:  [phone]

## 2014-03-31 ENCOUNTER — Ambulatory Visit: Payer: Medicare Other | Admitting: Cardiology

## 2014-04-01 ENCOUNTER — Ambulatory Visit: Payer: Medicare Other | Admitting: Physical Therapy

## 2014-04-01 ENCOUNTER — Encounter: Payer: Self-pay | Admitting: Physical Therapy

## 2014-04-01 DIAGNOSIS — M25611 Stiffness of right shoulder, not elsewhere classified: Secondary | ICD-10-CM

## 2014-04-01 DIAGNOSIS — M25511 Pain in right shoulder: Secondary | ICD-10-CM | POA: Diagnosis not present

## 2014-04-01 NOTE — Therapy (Signed)
[facility] [place], [place], [address] Phone: [phone]   Fax:  [phone]  Physical Therapy Treatment  Patient Details  Name: Angela Wong MRN: [id number] Date of Birth: [birth year] Referring Provider:  [name], FNP  Encounter Date: 04/01/2014      PT End of Session - 04/01/14 0852    Visit Number 2   Number of Visits 12   Date for PT Re-Evaluation 05/10/14   PT Start Time 0812   PT Stop Time 0910   PT Time Calculation (min) 58 min   Activity Tolerance Patient tolerated treatment well   Behavior During Therapy [facility] for tasks assessed/performed      Past Medical History  Diagnosis Date  . A-fib   . Sjogren's syndrome   . Raynaud's disease   . RLS (restless legs syndrome)   . IBS (irritable bowel syndrome)   . MVP (mitral valve prolapse)   . Cerebral vasculitis   . Osteoarthritis   . Asthma   . Ovarian cancer     lymph node removal with hysterectomy  . COPD (chronic obstructive pulmonary disease)   . GERD (gastroesophageal reflux disease)   . Fibromyalgia     Past Surgical History  Procedure Laterality Date  . Appendectomy    . Abdominal hysterectomy    . Cataract extraction Left   . Breast surgery      Biopsy    There were no vitals filed for this visit.  Visit Diagnosis:  Right shoulder pain  Shoulder stiffness, right      Subjective Assessment - 04/01/14 0817    Symptoms A little sore today   Currently in Pain? Yes   Pain Score 4    Pain Location Shoulder   Pain Orientation Right   Pain Descriptors / Indicators Aching;Sore   Pain Type Chronic pain   Pain Onset 1 to 4 weeks ago   Aggravating Factors  movement   Pain Relieving Factors rest            [facility] PT Assessment - 04/01/14 0001    ROM / Strength   AROM / PROM / Strength AROM;PROM   AROM   Overall AROM  --   AROM Assessment Site --   Right/Left Shoulder --   PROM   Overall PROM  Deficits   PROM  Assessment Site Shoulder   Right/Left Shoulder Right   Right Shoulder Flexion 135 Degrees   Right Shoulder External Rotation 90 Degrees                   [facility] Adult PT Treatment/Exercise - 04/01/14 0001    Exercises   Exercises Shoulder   Shoulder Exercises: Supine   Other Supine Exercises Cane for flexion/chest press/ER 2x10 each   Shoulder Exercises: Pulleys   Flexion --  5 min   Other Pulley Exercises UE Ranger for elevation and circles 2x10   Modalities   Modalities Electrical Stimulation;Moist Heat   Moist Heat Therapy   Number Minutes Moist Heat 15 Minutes   Moist Heat Location Shoulder   Electrical Stimulation   Electrical Stimulation Location Right shoulder   Electrical Stimulation Action 5/5   Electrical Stimulation Parameters premod 1-10hz    Electrical Stimulation Goals Pain   Manual Therapy   Manual Therapy Passive ROM   Passive ROM for flexion/ er with gentle range, then isometrics for IR/ER in scaption                PT  Education - 04/01/14 [phone]    Education provided Yes   Education Details HEP cane exercises   Person(s) Educated Patient   Methods Explanation;Demonstration;Handout   Comprehension Verbalized understanding;Returned demonstration          PT Short Term Goals - 03/29/14 1033    PT SHORT TERM GOAL #1   Title Ind. with HP.   Time 6   Period Weeks   Status New   PT SHORT TERM GOAL #2   Title Active right shoulder flexion to 145 degrees so the patient can easily reach overhead.   Time 6   Period Weeks   Status New   PT SHORT TERM GOAL #3   Title Active ER to 70 degrees+ to allow for easily donning/doffing of apparel   Time 6   Status New                  Plan - 04/01/14 0855    Clinical Impression Statement pt tolerated tx very well today, has improved ROM and able to tolerate ex's. pt understands HEP cane ex's. goals ongoing   Pt will benefit from skilled therapeutic intervention in order to improve on  the following deficits Pain;Decreased strength   Rehab Potential Good   PT Duration 6 weeks   PT Treatment/Interventions Electrical Stimulation;Patient/family education;Therapeutic exercise;Passive range of motion;Ultrasound   PT Next Visit Plan cont with POC   Consulted and Agree with Plan of Care Patient        Problem List Patient Active Problem List   Diagnosis Date Noted  . Rotator cuff tear 03/23/2014  . Osteoarthritis   . Asthma   . Ovarian cancer   . COPD (chronic obstructive pulmonary disease)   . Fibromyalgia   . RLS (restless legs syndrome)   . Cerebral vasculitis   . Sjogren's syndrome   . Raynaud's disease   . MVP (mitral valve prolapse)   . IBS (irritable bowel syndrome)   . Congestive dilated cardiomyopathy 02/09/2014  . Dyspnea 12/13/2013  . Hyperlipidemia 11/26/2013  . Malaise and fatigue 11/26/2013  . Multiple drug allergies 11/02/2013  . History of ovarian cancer 10/01/2013  . GERD (gastroesophageal reflux disease) 10/01/2013  . Benign essential HTN 10/01/2013  . Atrial fibrillation 07/27/2013  . Long term (current) use of anticoagulants 07/27/2013    Elaine Middleton P, PTA 04/01/2014, 9:15 AM  [facility] [place], [place], [address] Phone: [phone]   Fax:  [phone]

## 2014-04-01 NOTE — Patient Instructions (Signed)
ROM: External / Internal Rotation - Wand   Holding wand with left hand palm up, push out from body with other hand, palm down. Keep both elbows bent. When stretch is felt, hold _5___ seconds. Repeat to other side, leading with same hand. Keep elbows bent. Repeat __10__ times per set. Do __2-3__ sets per session. Do __2__ sessions per day.  http://orth.exer.us/748   Copyright  VHI. All rights reserved.  ROM: Flexion - Wand (Supine)   Lie on back holding wand. Raise arms over head.  Repeat __10__ times per set. Do _2-3___ sets per session. Do _2___ sessions per day.  http://orth.exer.us/928   Copyright  VHI. All rights reserved.   

## 2014-04-05 ENCOUNTER — Encounter: Payer: Medicare Other | Admitting: Physical Therapy

## 2014-04-07 ENCOUNTER — Ambulatory Visit: Payer: Medicare Other | Admitting: Physical Therapy

## 2014-04-07 ENCOUNTER — Ambulatory Visit (INDEPENDENT_AMBULATORY_CARE_PROVIDER_SITE_OTHER): Payer: Medicare Other | Admitting: Pharmacist

## 2014-04-07 ENCOUNTER — Encounter: Payer: Self-pay | Admitting: Physical Therapy

## 2014-04-07 ENCOUNTER — Other Ambulatory Visit: Payer: Self-pay | Admitting: Family Medicine

## 2014-04-07 DIAGNOSIS — Z7901 Long term (current) use of anticoagulants: Secondary | ICD-10-CM

## 2014-04-07 DIAGNOSIS — Z9181 History of falling: Secondary | ICD-10-CM

## 2014-04-07 DIAGNOSIS — I4819 Other persistent atrial fibrillation: Secondary | ICD-10-CM

## 2014-04-07 DIAGNOSIS — R296 Repeated falls: Secondary | ICD-10-CM

## 2014-04-07 DIAGNOSIS — I481 Persistent atrial fibrillation: Secondary | ICD-10-CM

## 2014-04-07 DIAGNOSIS — M25511 Pain in right shoulder: Secondary | ICD-10-CM

## 2014-04-07 DIAGNOSIS — I480 Paroxysmal atrial fibrillation: Secondary | ICD-10-CM

## 2014-04-07 DIAGNOSIS — M25611 Stiffness of right shoulder, not elsewhere classified: Secondary | ICD-10-CM

## 2014-04-07 LAB — POCT INR: INR: 2.4

## 2014-04-07 NOTE — Patient Instructions (Signed)
Anticoagulation Dose Instructions as of 04/07/2014      Dorene Grebe Tue Wed Thu Fri Sat   New Dose 2 mg 2 mg 2 mg 2 mg 2 mg 2 mg 4 mg    Description        Continue warfarin dose of  4mg  on saturdays and 2mg  all other days.      INR was 2.4 today

## 2014-04-07 NOTE — Therapy (Signed)
[facility] [place], [place], [address] Phone: [phone]   Fax:  [phone]  Physical Therapy Treatment  Patient Details  Name: Angela Wong MRN: [id number] Date of Birth: June 16, 1940 Referring Provider:  [name], FNP  Encounter Date: 04/07/2014      PT End of Session - 04/07/14 1020    Visit Number 3   Number of Visits 12   Date for PT Re-Evaluation 05/10/14   PT Start Time 0946   PT Stop Time 1044   PT Time Calculation (min) 58 min   Activity Tolerance Patient tolerated treatment well   Behavior During Therapy [facility] for tasks assessed/performed      Past Medical History  Diagnosis Date  . A-fib   . Sjogren's syndrome   . Raynaud's disease   . RLS (restless legs syndrome)   . IBS (irritable bowel syndrome)   . MVP (mitral valve prolapse)   . Cerebral vasculitis   . Osteoarthritis   . Asthma   . Ovarian cancer     lymph node removal with hysterectomy  . COPD (chronic obstructive pulmonary disease)   . GERD (gastroesophageal reflux disease)   . Fibromyalgia     Past Surgical History  Procedure Laterality Date  . Appendectomy    . Abdominal hysterectomy    . Cataract extraction Left   . Breast surgery      Biopsy    There were no vitals filed for this visit.  Visit Diagnosis:  Right shoulder pain  Shoulder stiffness, right      Subjective Assessment - 04/07/14 1004    Symptoms felt better after last tx, some soreness after lifting dog(~40lbs) although better toaday   Currently in Pain? Yes   Pain Score 3    Pain Location Shoulder   Pain Orientation Right   Pain Descriptors / Indicators Sore   Pain Type Chronic pain   Pain Onset 1 to 4 weeks ago   Aggravating Factors  lifting and movement   Pain Relieving Factors rest   Multiple Pain Sites No            [facility] PT Assessment - 04/07/14 0001    AROM   Overall AROM  Within functional limits for tasks performed;Deficits   AROM Assessment Site Shoulder   Right/Left Shoulder Right   Right Shoulder Flexion 70 Degrees   Right Shoulder External Rotation 70 Degrees   PROM   Overall PROM  Deficits   PROM Assessment Site Shoulder   Right/Left Shoulder Right   Right Shoulder Flexion 143 Degrees   Right Shoulder External Rotation 90 Degrees                   [facility] Adult PT Treatment/Exercise - 04/07/14 0001    Exercises   Exercises Shoulder   Shoulder Exercises: Supine   Other Supine Exercises Cane for flexion/chest press/ER 2x10 each   Shoulder Exercises: Pulleys   Flexion --  50mn   Moist Heat Therapy   Number Minutes Moist Heat 15 Minutes   Moist Heat Location Shoulder   Electrical Stimulation   Electrical Stimulation Location Right shoulder   Electrical Stimulation Action 5/5   Electrical Stimulation Parameters premod    Electrical Stimulation Goals Pain   Manual Therapy   Manual Therapy Passive ROM   Passive ROM for flexion/ er with gentle range, then rythmic stabs for IR/ER in scaption  PT Short Term Goals - 04/07/14 1036    PT SHORT TERM GOAL #1   Title Ind. with HP.   Time 6   Period Weeks   Status On-going   PT SHORT TERM GOAL #2   Title Active right shoulder flexion to 145 degrees so the patient can easily reach overhead.   Time 6   Period Weeks   Status On-going   PT SHORT TERM GOAL #3   Title Active ER to 70 degrees+ to allow for easily donning/doffing of apparel   Time 6   Period Weeks   Status Achieved                  Plan - 04/07/14 1031    Clinical Impression Statement pt tolerated tx well today and has impoved ROM, able to donn and doff apparal and MET STG#3 today. pt continues to have pain and weakness with supine shoulder flexion.   Pt will benefit from skilled therapeutic intervention in order to improve on the following deficits Pain;Decreased strength   Rehab Potential Good   PT Duration 6 weeks   PT  Treatment/Interventions Electrical Stimulation;Patient/family education;Therapeutic exercise;Passive range of motion;Ultrasound   PT Next Visit Plan cont with POC may try UBE/RW4 and seated cane if pt can tolerate   Consulted and Agree with Plan of Care Patient        Problem List Patient Active Problem List   Diagnosis Date Noted  . Rotator cuff tear 03/23/2014  . Osteoarthritis   . Asthma   . Ovarian cancer   . COPD (chronic obstructive pulmonary disease)   . Fibromyalgia   . RLS (restless legs syndrome)   . Cerebral vasculitis   . Sjogren's syndrome   . Raynaud's disease   . MVP (mitral valve prolapse)   . IBS (irritable bowel syndrome)   . Congestive dilated cardiomyopathy 02/09/2014  . Dyspnea 12/13/2013  . Hyperlipidemia 11/26/2013  . Malaise and fatigue 11/26/2013  . Multiple drug allergies 11/02/2013  . History of ovarian cancer 10/01/2013  . GERD (gastroesophageal reflux disease) 10/01/2013  . Benign essential HTN 10/01/2013  . Atrial fibrillation 07/27/2013  . Long term (current) use of anticoagulants 07/27/2013    Kawehi Hostetter P, PTA 04/07/2014, 10:49 AM  [facility] [place], [place], [address] Phone: [phone]   Fax:  [phone]

## 2014-04-11 DIAGNOSIS — M3500 Sicca syndrome, unspecified: Secondary | ICD-10-CM | POA: Diagnosis not present

## 2014-04-11 DIAGNOSIS — M1990 Unspecified osteoarthritis, unspecified site: Secondary | ICD-10-CM | POA: Diagnosis not present

## 2014-04-11 DIAGNOSIS — R5382 Chronic fatigue, unspecified: Secondary | ICD-10-CM | POA: Diagnosis not present

## 2014-04-11 DIAGNOSIS — G8929 Other chronic pain: Secondary | ICD-10-CM | POA: Diagnosis not present

## 2014-04-13 ENCOUNTER — Encounter: Payer: Self-pay | Admitting: Cardiology

## 2014-04-13 ENCOUNTER — Ambulatory Visit: Payer: Medicare Other | Admitting: Cardiology

## 2014-04-13 ENCOUNTER — Ambulatory Visit: Payer: Medicare Other | Admitting: Physical Therapy

## 2014-04-13 ENCOUNTER — Ambulatory Visit (INDEPENDENT_AMBULATORY_CARE_PROVIDER_SITE_OTHER): Payer: Medicare Other | Admitting: Cardiology

## 2014-04-13 ENCOUNTER — Encounter: Payer: Self-pay | Admitting: Physical Therapy

## 2014-04-13 VITALS — BP 140/82 | HR 48 | Ht 64.75 in | Wt 150.0 lb

## 2014-04-13 DIAGNOSIS — M25511 Pain in right shoulder: Secondary | ICD-10-CM

## 2014-04-13 DIAGNOSIS — I482 Chronic atrial fibrillation, unspecified: Secondary | ICD-10-CM

## 2014-04-13 DIAGNOSIS — M25611 Stiffness of right shoulder, not elsewhere classified: Secondary | ICD-10-CM

## 2014-04-13 NOTE — Patient Instructions (Signed)
Please stop your Atenolol.  Continue all other medications as listed.  Follow up in 4 months with Dr. Percival Spanish.  You will receive a letter in the mail 2 months before you are due.  Please call us when you receive this letter to schedule your follow up appointment.  Thank you for choosing [facility]!!

## 2014-04-13 NOTE — Progress Notes (Signed)
HPI The patient presents as a new patient. She had a long medical history. She has had long-standing atrial fibrillation. She's been managed with rate control and anticoagulation. She apparently chose not to have cardioversion in the past.    She does have chronic pain from multiple sources.   She did have a mildly reduced ejection fraction of 45-50%. I sent her for a stress perfusion study this demonstrated no evidence of ischemia. I switched her to carvedilol from her previous beta blocker cause of her mildly reduced ejection fraction. However, she did not tolerate this with multiple complaints including dry eyes. I switched her back to atenolol.  I started her back at a lower dose and then followed up with a Holter. She does have a relatively slow rate with a level of 37 bpm an average of 67 and a high of 111.  She reports that she does have episodes of weakness and tiredness. She's not had any presyncope or syncope. She had one episode of severe hypertension due to stress related to the death of her dog. She has joint problems and gait disturbance. She's not describing any new cardiovascular symptoms however. She's not had any new chest pressure or shortness of breath.  Allergies  Allergen Reactions  . Cephalosporins Hives and Shortness Of Breath  . Ciprofloxacin Hives and Shortness Of Breath  . Doxycycline Hives and Shortness Of Breath  . Horse-Derived Products Anaphylaxis  . Ketek [Telithromycin] Palpitations    Chest discomfort  . Nitrofuran Derivatives Anaphylaxis and Hives  . Nitrous Oxide Nausea And Vomiting    Severe due to Sjogrens (Auto-Immune Disease)  . Other Anaphylaxis    ALLERGY TO HORSE SERUM  . Penicillins Hives and Shortness Of Breath  . Sulfa Antibiotics Hives and Shortness Of Breath  . Talwin [Pentazocine] Other (See Comments)    Altered Mental Status   . Amlodipine Swelling  . Carvedilol Other (See Comments)    dizziness  . Codeine Nausea Only  . Cymbalta  [Duloxetine Hcl] Swelling  . Diovan [Valsartan] Swelling  . Lisinopril Swelling  . Tramadol Nausea Only  . Zoloft [Sertraline Hcl]   . Trovan [Alatrofloxacin] Palpitations and Other (See Comments)    Chest pain, dizziness, irregular pulse    Current Outpatient Prescriptions  Medication Sig Dispense Refill  . albuterol (PROVENTIL HFA;VENTOLIN HFA) 108 (90 BASE) MCG/ACT inhaler Inhale 2 puffs into the lungs every 6 (six) hours as needed for wheezing.    Marland Kitchen atorvastatin (LIPITOR) 20 MG tablet Take 1 tablet (20 mg total) by mouth at bedtime. 90 tablet 1  . B Complex-C (B-COMPLEX WITH VITAMIN C) tablet Take 1 tablet by mouth daily.    . bumetanide (BUMEX) 0.5 MG tablet Take 1 tablet (0.5 mg total) by mouth every morning. 90 tablet 1  . cloNIDine (CATAPRES) 0.1 MG tablet Take 1 tablet (0.1 mg total) by mouth 2 (two) times daily. 180 tablet 1  . Cyanocobalamin (VITAMIN B 12 PO) Take 500 mg by mouth daily.    . cycloSPORINE (RESTASIS) 0.05 % ophthalmic emulsion Place 1 drop into both eyes 2 (two) times daily. 3 each 1  . docusate sodium (COLACE) 100 MG capsule Take 300 mg by mouth daily.    Marland Kitchen esomeprazole (NEXIUM) 40 MG capsule Take 1 capsule (40 mg total) by mouth 2 (two) times daily before a meal. 180 capsule 1  . fexofenadine (ALLEGRA) 180 MG tablet Take 180 mg by mouth every morning.     . Iron Polysacch Cmplx-B12-FA (POLYSACCHARIDE  IRON FORTE) 150-0.025-1 MG CAPS Take 1 tablet by mouth daily. 90 each 0  . levothyroxine (SYNTHROID, LEVOTHROID) 100 MCG tablet Take 1 tablet (100 mcg total) by mouth every morning. 90 tablet 1  . losartan (COZAAR) 100 MG tablet Take 1 tablet (100 mg total) by mouth at bedtime. 90 tablet 1  . metroNIDAZOLE (METROGEL) 1 % gel Apply 1 application topically daily. 180 g 1  . montelukast (SINGULAIR) 10 MG tablet Take 1 tablet (10 mg total) by mouth at bedtime. 90 tablet 1  . predniSONE (DELTASONE) 5 MG tablet Take 1 tablet (5 mg total) by mouth daily. 90 tablet 0  .  PREVIDENT 5000 DRY MOUTH 1.1 % GEL dental gel Place 1 application onto teeth as needed (as needed for dry mouth).     . ranitidine (ZANTAC) 150 MG tablet One at bedtime 90 tablet 1  . Simethicone (GAS-X EXTRA STRENGTH) 125 MG CAPS Take 1 capsule by mouth daily as needed (for relief).    . traZODone (DESYREL) 150 MG tablet Take 1 tablet by mouth at  bedtime 90 tablet 0  . warfarin (COUMADIN) 1 MG tablet Take as directed 2 tablets  by mouth daily Monday -  Friday and 4 tablets by  mouth daily on Saturday  Sunday 216 tablet 0  . budesonide-formoterol (SYMBICORT) 80-4.5 MCG/ACT inhaler Take 2 puffs first thing in am and then another 2 puffs about 12 hours later. (Patient not taking: Reported on 04/13/2014) 3 Inhaler 1  . Cholecalciferol (VITAMIN D) 2000 UNITS CAPS Take 1 capsule by mouth every evening.     . cyclobenzaprine (FLEXERIL) 5 MG tablet Take 1 tablet (5 mg total) by mouth 3 (three) times daily as needed for muscle spasms. Take 1 or 2 tablets at bedtime as needed for restless leg syndrome (Patient not taking: Reported on 04/13/2014) 90 tablet 0  . diazepam (VALIUM) 5 MG tablet Take 1 tablet (5 mg total) by mouth every 6 (six) hours as needed for anxiety. (Patient not taking: Reported on 04/13/2014) 30 tablet 0  . nitroGLYCERIN (MINITRAN) 0.2 mg/hr patch 1/3 patch to affected area every 24 hrs (Patient not taking: Reported on 04/13/2014) 30 patch 0  . ondansetron (ZOFRAN) 4 MG tablet Take 1 tablet (4 mg total) by mouth every 8 (eight) hours as needed for nausea or vomiting. (Patient not taking: Reported on 04/13/2014) 20 tablet 0  . OxyCODONE (OXYCONTIN) 10 mg T12A 12 hr tablet Take 1 tablet (10 mg total) by mouth every 12 (twelve) hours. (Patient not taking: Reported on 04/13/2014) 60 tablet 0  . oxyCODONE-acetaminophen (ROXICET) 5-325 MG per tablet Take 1 tablet by mouth every 6 (six) hours as needed for severe pain. (Patient not taking: Reported on 04/13/2014) 20 tablet 0  . Tapentadol HCl (NUCYNTA ER)  100 MG TB12 Take 100 mg by mouth at bedtime as needed (for pain/sleep). (Patient not taking: Reported on 04/13/2014) 30 tablet 0   No current facility-administered medications for this visit.    Past Medical History  Diagnosis Date  . A-fib   . Sjogren's syndrome   . Raynaud's disease   . RLS (restless legs syndrome)   . IBS (irritable bowel syndrome)   . MVP (mitral valve prolapse)   . Cerebral vasculitis   . Osteoarthritis   . Asthma   . Ovarian cancer     lymph node removal with hysterectomy  . COPD (chronic obstructive pulmonary disease)   . GERD (gastroesophageal reflux disease)   . Fibromyalgia  Past Surgical History  Procedure Laterality Date  . Appendectomy    . Abdominal hysterectomy    . Cataract extraction Left   . Breast surgery      Biopsy    ROS:  As stated in the HPI and negative for all other systems.  PHYSICAL EXAM BP 140/82 mmHg  Pulse 48  Ht 5' 4.75" (1.645 m)  Wt 150 lb (68.04 kg)  BMI 25.14 kg/m2  GENERAL:  Well appearing NECK:  No jugular venous distention, waveform within normal limits, carotid upstroke brisk and symmetric, no bruits, no thyromegaly LUNGS:  Clear to auscultation bilaterally BACK:  No CVA tenderness CHEST:  Unremarkable HEART:  PMI not displaced or sustained,S1 and S2 within normal limits, no S3, no clicks, no rubs, no murmurs,  irregular ABD:  Flat, positive bowel sounds normal in frequency in pitch, no bruits, no rebound, no guarding, no midline pulsatile mass, no hepatomegaly, no splenomegaly EXT:  2 plus pulses throughout, mod ankle edema, no cyanosis no clubbing SKIN:  No rashes no nodules    ASSESSMENT AND PLAN  ATRIAL FIB:  She will remain on her anticoagulation. At this point given the relatively slow rates I would discontinue her atenolol altogether and see if she does.  I did personally review the Holter today and reviewed the results of the patient.  FATIGUE:  TSH and T3/T4 were normal.  No further work up is  planned. Further workup for her primary provider.  HTN:  This is being managed in the context of treating his CHF.  As above I don't think she is tolerating the beta blocker and this will be stopped. We discussed when necessary management with clonidine for any hypertensive urgency.  DYSPNEA:  This seems to be baseline.    CARDIOMYOPATHY:  This appears to be nonischemic by her stress perfusion study. She is very sensitive to medications. As above.

## 2014-04-13 NOTE — Therapy (Signed)
[facility] [place], [place], [address] Phone: [phone]   Fax:  [phone]  Physical Therapy Treatment  Patient Details  Name: Angela Wong MRN: [id number] Date of Birth: Jun 13, 1940 Referring Provider:  [name], FNP  Encounter Date: 04/13/2014      PT End of Session - 04/13/14 0939    Visit Number 4   Number of Visits 12   Date for PT Re-Evaluation 05/10/14   PT Start Time 0900   PT Stop Time 0957   PT Time Calculation (min) 57 min   Activity Tolerance Patient tolerated treatment well   Behavior During Therapy [facility] for tasks assessed/performed      Past Medical History  Diagnosis Date  . A-fib   . Sjogren's syndrome   . Raynaud's disease   . RLS (restless legs syndrome)   . IBS (irritable bowel syndrome)   . MVP (mitral valve prolapse)   . Cerebral vasculitis   . Osteoarthritis   . Asthma   . Ovarian cancer     lymph node removal with hysterectomy  . COPD (chronic obstructive pulmonary disease)   . GERD (gastroesophageal reflux disease)   . Fibromyalgia     Past Surgical History  Procedure Laterality Date  . Appendectomy    . Abdominal hysterectomy    . Cataract extraction Left   . Breast surgery      Biopsy    There were no vitals filed for this visit.  Visit Diagnosis:  Right shoulder pain  Shoulder stiffness, right      Subjective Assessment - 04/13/14 0909    Symptoms felt good after last tx, some soreness today after a lot of cooking and big dog caused her to stubmle almost fall, twisting neck and right shoulder   Currently in Pain? Yes   Pain Score 4    Pain Location Shoulder   Pain Orientation Right   Pain Descriptors / Indicators Sore   Pain Type Chronic pain   Pain Onset 1 to 4 weeks ago   Aggravating Factors  increased activity   Pain Relieving Factors rest            [facility] PT Assessment - 04/13/14 0001    AROM   Overall AROM  Within functional limits for  tasks performed;Deficits   AROM Assessment Site Shoulder   Right/Left Shoulder Right   Right Shoulder Flexion 85 Degrees   Right Shoulder External Rotation 70 Degrees   PROM   Overall PROM  Deficits   PROM Assessment Site Shoulder   Right/Left Shoulder Right   Right Shoulder Flexion 145 Degrees   Right Shoulder External Rotation 90 Degrees                   [facility] Adult PT Treatment/Exercise - 04/13/14 0001    Shoulder Exercises: Seated   Other Seated Exercises cane for flexion and chest press 2x10 each   Shoulder Exercises: Pulleys   Flexion --  26min   Shoulder Exercises: ROM/Strengthening   UBE (Upper Arm Bike) 17min   Other ROM/Strengthening Exercises RW4 with yellow tband 2x10 each   Moist Heat Therapy   Number Minutes Moist Heat 15 Minutes   Moist Heat Location Shoulder   Electrical Stimulation   Electrical Stimulation Location Right shoulder   Electrical Stimulation Action 5/5   Electrical Stimulation Parameters premod   Electrical Stimulation Goals Pain   Manual Therapy   Manual Therapy Passive ROM   Passive ROM for flexion/  er with gentle range, then rythmic stabs for IR/ER in scaption                  PT Short Term Goals - 04/07/14 1036    PT SHORT TERM GOAL #1   Title Ind. with HP.   Time 6   Period Weeks   Status On-going   PT SHORT TERM GOAL #2   Title Active right shoulder flexion to 145 degrees so the patient can easily reach overhead.   Time 6   Period Weeks   Status On-going   PT SHORT TERM GOAL #3   Title Active ER to 70 degrees+ to allow for easily donning/doffing of apparel   Time 6   Period Weeks   Status Achieved                  Plan - 04/13/14 0940    Clinical Impression Statement pt continues to progress with all activities. has improved AROM today and tolerated ther ex today with no reports of pain increase. goals ongoing.   Pt will benefit from skilled therapeutic intervention in order to improve on the  following deficits Pain;Decreased strength   Rehab Potential Good   PT Frequency 2x / week   PT Duration 6 weeks   PT Treatment/Interventions Electrical Stimulation;Patient/family education;Therapeutic exercise;Passive range of motion;Ultrasound   PT Next Visit Plan cont with POC   Consulted and Agree with Plan of Care Patient        Problem List Patient Active Problem List   Diagnosis Date Noted  . Rotator cuff tear 03/23/2014  . Osteoarthritis   . Asthma   . Ovarian cancer   . COPD (chronic obstructive pulmonary disease)   . Fibromyalgia   . RLS (restless legs syndrome)   . Cerebral vasculitis   . Sjogren's syndrome   . Raynaud's disease   . MVP (mitral valve prolapse)   . IBS (irritable bowel syndrome)   . Congestive dilated cardiomyopathy 02/09/2014  . Dyspnea 12/13/2013  . Hyperlipidemia 11/26/2013  . Malaise and fatigue 11/26/2013  . Multiple drug allergies 11/02/2013  . History of ovarian cancer 10/01/2013  . GERD (gastroesophageal reflux disease) 10/01/2013  . Benign essential HTN 10/01/2013  . Atrial fibrillation 07/27/2013  . Long term (current) use of anticoagulants 07/27/2013    Tremayne Sheldon P, PTA 04/13/2014, 9:57 AM  [facility] [place], [place], [address] Phone: [phone]   Fax:  [phone]

## 2014-04-15 ENCOUNTER — Encounter: Payer: Self-pay | Admitting: Physical Therapy

## 2014-04-15 ENCOUNTER — Ambulatory Visit: Payer: Medicare Other | Attending: Orthopedic Surgery | Admitting: Physical Therapy

## 2014-04-15 DIAGNOSIS — M25611 Stiffness of right shoulder, not elsewhere classified: Secondary | ICD-10-CM | POA: Diagnosis not present

## 2014-04-15 DIAGNOSIS — M25511 Pain in right shoulder: Secondary | ICD-10-CM | POA: Insufficient documentation

## 2014-04-15 NOTE — Therapy (Signed)
[facility] [place], [place], [address] Phone: [phone]   Fax:  [phone]  Physical Therapy Treatment  Patient Details  Name: Angela Wong MRN: [id number] Date of Birth: 1940-09-03 Referring Provider:  [name], FNP  Encounter Date: 04/15/2014      PT End of Session - 04/15/14 1111    Visit Number 5   Number of Visits 12   Date for PT Re-Evaluation 05/10/14   PT Start Time 1031   PT Stop Time 1129   PT Time Calculation (min) 58 min   Activity Tolerance Patient tolerated treatment well   Behavior During Therapy [facility] for tasks assessed/performed      Past Medical History  Diagnosis Date  . A-fib   . Sjogren's syndrome   . Raynaud's disease   . RLS (restless legs syndrome)   . IBS (irritable bowel syndrome)   . MVP (mitral valve prolapse)   . Cerebral vasculitis   . Osteoarthritis   . Asthma   . Ovarian cancer     lymph node removal with hysterectomy  . COPD (chronic obstructive pulmonary disease)   . GERD (gastroesophageal reflux disease)   . Fibromyalgia     Past Surgical History  Procedure Laterality Date  . Appendectomy    . Abdominal hysterectomy    . Cataract extraction Left   . Breast surgery      Biopsy    There were no vitals filed for this visit.  Visit Diagnosis:  Right shoulder pain  Shoulder stiffness, right      Subjective Assessment - 04/15/14 1037    Symptoms some soreness today may be from sleeping on it, and doing a lot of lifting.pain range is 2-3/10 up to 4/10   Currently in Pain? Yes   Pain Score 3    Pain Location Shoulder   Pain Orientation Right   Pain Descriptors / Indicators Sore   Pain Type Chronic pain   Pain Onset 1 to 4 weeks ago   Aggravating Factors  increased activity   Pain Relieving Factors sore                       [facility] Adult PT Treatment/Exercise - 04/15/14 0001    Shoulder Exercises: Seated   Other Seated Exercises  cane for flexion and chest press 2x10 each   Shoulder Exercises: Pulleys   Flexion --  4min   Shoulder Exercises: ROM/Strengthening   UBE (Upper Arm Bike) 59min   Other ROM/Strengthening Exercises RW4 with yellow tband 3x10 each   Moist Heat Therapy   Number Minutes Moist Heat 15 Minutes   Moist Heat Location Shoulder   Electrical Stimulation   Electrical Stimulation Location Right shoulder   Electrical Stimulation Action 5/5   Electrical Stimulation Parameters premod   Electrical Stimulation Goals Pain   Manual Therapy   Manual Therapy Passive ROM   Passive ROM for flexion/ er with gentle range, then rythmic stabs for IR/ER in scaption and flex/ext @ 90                  PT Short Term Goals - 04/07/14 1036    PT SHORT TERM GOAL #1   Title Ind. with HP.   Time 6   Period Weeks   Status On-going   PT SHORT TERM GOAL #2   Title Active right shoulder flexion to 145 degrees so the patient can easily reach overhead.   Time 6  Period Weeks   Status On-going   PT SHORT TERM GOAL #3   Title Active ER to 70 degrees+ to allow for easily donning/doffing of apparel   Time 6   Period Weeks   Status Achieved                  Plan - 04/15/14 1116    Clinical Impression Statement pt is progressing with all activities.continues to have difficulty with ADL's due to weakness. pt able to see progress with strengthening. goals ongoing. FOTO 55%limitation (initial60%)   Pt will benefit from skilled therapeutic intervention in order to improve on the following deficits Pain;Decreased strength   Rehab Potential Good   PT Frequency 2x / week   PT Duration 6 weeks   PT Treatment/Interventions Electrical Stimulation;Patient/family education;Therapeutic exercise;Passive range of motion;Ultrasound   PT Next Visit Plan cont with POC   Consulted and Agree with Plan of Care Patient        Problem List Patient Active Problem List   Diagnosis Date Noted  . Rotator cuff tear  03/23/2014  . Osteoarthritis   . Asthma   . Ovarian cancer   . COPD (chronic obstructive pulmonary disease)   . Fibromyalgia   . RLS (restless legs syndrome)   . Cerebral vasculitis   . Sjogren's syndrome   . Raynaud's disease   . MVP (mitral valve prolapse)   . IBS (irritable bowel syndrome)   . Congestive dilated cardiomyopathy 02/09/2014  . Dyspnea 12/13/2013  . Hyperlipidemia 11/26/2013  . Malaise and fatigue 11/26/2013  . Multiple drug allergies 11/02/2013  . History of ovarian cancer 10/01/2013  . GERD (gastroesophageal reflux disease) 10/01/2013  . Benign essential HTN 10/01/2013  . Atrial fibrillation 07/27/2013  . Long term (current) use of anticoagulants 07/27/2013    Carmesha Morocco P, PTA 04/15/2014, 11:32 AM  [facility] [place], [place], [address] Phone: [phone]   Fax:  [phone]

## 2014-04-19 ENCOUNTER — Ambulatory Visit: Payer: Medicare Other | Admitting: Physical Therapy

## 2014-04-19 DIAGNOSIS — M25511 Pain in right shoulder: Secondary | ICD-10-CM | POA: Diagnosis not present

## 2014-04-19 DIAGNOSIS — M25611 Stiffness of right shoulder, not elsewhere classified: Secondary | ICD-10-CM

## 2014-04-19 NOTE — Therapy (Signed)
[facility] [place], [place], [address] Phone: [phone]   Fax:  [phone]  Physical Therapy Treatment  Patient Details  Name: Angela Wong MRN: [id number] Date of Birth: [birth year] Referring Provider:  [name], FNP  Encounter Date: 04/19/2014      PT End of Session - 04/19/14 1037    Visit Number 6   Number of Visits 12   Date for PT Re-Evaluation 05/10/14   PT Start Time 6384   PT Stop Time 1132   PT Time Calculation (min) 57 min   Activity Tolerance Patient tolerated treatment well   Behavior During Therapy [facility] for tasks assessed/performed      Past Medical History  Diagnosis Date  . A-fib   . Sjogren's syndrome   . Raynaud's disease   . RLS (restless legs syndrome)   . IBS (irritable bowel syndrome)   . MVP (mitral valve prolapse)   . Cerebral vasculitis   . Osteoarthritis   . Asthma   . Ovarian cancer     lymph node removal with hysterectomy  . COPD (chronic obstructive pulmonary disease)   . GERD (gastroesophageal reflux disease)   . Fibromyalgia     Past Surgical History  Procedure Laterality Date  . Appendectomy    . Abdominal hysterectomy    . Cataract extraction Left   . Breast surgery      Biopsy    There were no vitals filed for this visit.  Visit Diagnosis:  Shoulder stiffness, right  Right shoulder pain      Subjective Assessment - 04/19/14 1038    Subjective I think I overdid it yesterday. It's sore today.   Currently in Pain? Yes   Pain Score 4    Pain Location Shoulder   Pain Orientation Right   Pain Descriptors / Indicators Sore   Pain Type Chronic pain   Pain Onset 1 to 4 weeks ago   Pain Frequency Constant   Aggravating Factors  overdoing it   Pain Relieving Factors rest   Multiple Pain Sites No                       [facility] Adult PT Treatment/Exercise - 04/19/14 0001    Exercises   Exercises Shoulder   Shoulder Exercises: Supine   Other Supine Exercises PNF D1/D2 x 10 each way   Other Supine Exercises Rhytmic Stab briefly. Pain  w/resisted flex   Shoulder Exercises: Sidelying   External Rotation Weight (lbs) 1#  3x10   Other Sidelying Exercises right shoulder flexion 3x10  Pt unable to perform against gravity without substitution.   Other Sidelying Exercises empty can 2x10   Shoulder Exercises: Pulleys   Flexion --  5 min Flexion   Shoulder Exercises: ROM/Strengthening   UBE (Upper Arm Bike) 6 min 90RPM  3 min fwd/ 3 bwd   Other ROM/Strengthening Exercises RW ext, IR, ER 3x10 unable to do flexion with yellow band with good form   Modalities   Modalities Electrical Stimulation;Moist Heat   Moist Heat Therapy   Number Minutes Moist Heat 15 Minutes   Moist Heat Location Shoulder   Electrical Stimulation   Electrical Stimulation Location Right shoulder   Electrical Stimulation Action 5/5   Electrical Stimulation Parameters premod   Electrical Stimulation Goals Pain                PT Education - 04/19/14 1500    Education provided Yes  Education Details shoulder flexion in sidelying   Person(s) Educated Patient   Methods Explanation;Demonstration   Comprehension Verbalized understanding;Returned demonstration          PT Short Term Goals - 04/07/14 1036    PT SHORT TERM GOAL #1   Title Ind. with HP.   Time 6   Period Weeks   Status On-going   PT SHORT TERM GOAL #2   Title Active right shoulder flexion to 145 degrees so the patient can easily reach overhead.   Time 6   Period Weeks   Status On-going   PT SHORT TERM GOAL #3   Title Active ER to 70 degrees+ to allow for easily donning/doffing of apparel   Time 6   Period Weeks   Status Achieved                  Plan - 04/19/14 1129    Clinical Impression Statement Patient has significant substitutions with shoudler flexion against gravity.    Pt will benefit from skilled therapeutic intervention in order to improve on the  following deficits Pain;Decreased strength   Rehab Potential Good   PT Frequency 2x / week   PT Duration 6 weeks   PT Treatment/Interventions Electrical Stimulation;Patient/family education;Therapeutic exercise;Passive range of motion;Ultrasound   PT Next Visit Plan continue strengthening, monitor for substitutions.   Consulted and Agree with Plan of Care Patient        Problem List Patient Active Problem List   Diagnosis Date Noted  . Rotator cuff tear 03/23/2014  . Osteoarthritis   . Asthma   . Ovarian cancer   . COPD (chronic obstructive pulmonary disease)   . Fibromyalgia   . RLS (restless legs syndrome)   . Cerebral vasculitis   . Sjogren's syndrome   . Raynaud's disease   . MVP (mitral valve prolapse)   . IBS (irritable bowel syndrome)   . Congestive dilated cardiomyopathy 02/09/2014  . Dyspnea 12/13/2013  . Hyperlipidemia 11/26/2013  . Malaise and fatigue 11/26/2013  . Multiple drug allergies 11/02/2013  . History of ovarian cancer 10/01/2013  . GERD (gastroesophageal reflux disease) 10/01/2013  . Benign essential HTN 10/01/2013  . Atrial fibrillation 07/27/2013  . Long term (current) use of anticoagulants 07/27/2013    Madelyn Flavors PT 04/19/2014, 3:04 PM  [facility] [address] [place], [place], [address] Phone: [phone]   Fax:  [phone]

## 2014-04-19 NOTE — Patient Instructions (Signed)
Perform shoulder Flexion in sidelying to shoulder height. 3x10. Add weight as needed.

## 2014-04-22 ENCOUNTER — Ambulatory Visit: Payer: Medicare Other | Admitting: Physical Therapy

## 2014-04-22 ENCOUNTER — Encounter: Payer: Self-pay | Admitting: Physical Therapy

## 2014-04-22 DIAGNOSIS — M25511 Pain in right shoulder: Secondary | ICD-10-CM

## 2014-04-22 DIAGNOSIS — M25611 Stiffness of right shoulder, not elsewhere classified: Secondary | ICD-10-CM

## 2014-04-22 NOTE — Therapy (Signed)
[facility] [place], [place], [address] Phone: [phone]   Fax:  [phone]  Physical Therapy Treatment  Patient Details  Name: Angela Wong MRN: [id number] Date of Birth: 12/08/1940 Referring Provider:  [name], FNP  Encounter Date: 04/22/2014      PT End of Session - 04/22/14 1130    Visit Number 7   Number of Visits 12   Date for PT Re-Evaluation 05/10/14   PT Start Time 1030   PT Stop Time 1129   PT Time Calculation (min) 59 min   Activity Tolerance Patient tolerated treatment well   Behavior During Therapy [facility] for tasks assessed/performed      Past Medical History  Diagnosis Date  . A-fib   . Sjogren's syndrome   . Raynaud's disease   . RLS (restless legs syndrome)   . IBS (irritable bowel syndrome)   . MVP (mitral valve prolapse)   . Cerebral vasculitis   . Osteoarthritis   . Asthma   . Ovarian cancer     lymph node removal with hysterectomy  . COPD (chronic obstructive pulmonary disease)   . GERD (gastroesophageal reflux disease)   . Fibromyalgia     Past Surgical History  Procedure Laterality Date  . Appendectomy    . Abdominal hysterectomy    . Cataract extraction Left   . Breast surgery      Biopsy    There were no vitals filed for this visit.  Visit Diagnosis:  Shoulder stiffness, right  Right shoulder pain      Subjective Assessment - 04/22/14 1052    Subjective continues to have soreness in shoulder due to overuse at home   Currently in Pain? Yes   Pain Score 5    Pain Location Shoulder   Pain Orientation Right   Pain Descriptors / Indicators Sore   Pain Type Chronic pain   Pain Onset 1 to 4 weeks ago   Aggravating Factors  increased activity   Pain Relieving Factors rest            [facility] PT Assessment - 04/22/14 0001    AROM   Overall AROM  Deficits   AROM Assessment Site Shoulder   Right/Left Shoulder Right   Right Shoulder External Rotation 70  Degrees   PROM   Overall PROM  Deficits   PROM Assessment Site Shoulder   Right/Left Shoulder Right   Right Shoulder Flexion 146 Degrees   Right Shoulder External Rotation 90 Degrees                   [facility] Adult PT Treatment/Exercise - 04/22/14 0001    Shoulder Exercises: Supine   Flexion AROM;Right;Strengthening  3x10   Other Supine Exercises Ceiling punch 2x10   Shoulder Exercises: Seated   Other Seated Exercises cane for flexion and chest press 2x10 each   Shoulder Exercises: Pulleys   Flexion --  5 min   Shoulder Exercises: ROM/Strengthening   UBE (Upper Arm Bike) 8 min 90RPM   Modalities   Modalities Electrical Stimulation;Moist Heat   Moist Heat Therapy   Number Minutes Moist Heat 15 Minutes   Moist Heat Location Shoulder   Electrical Stimulation   Electrical Stimulation Location Right shoulder   Electrical Stimulation Action 5/5   Electrical Stimulation Parameters premod   Electrical Stimulation Goals Pain   Manual Therapy   Manual Therapy Passive ROM   Passive ROM for flexion/ er with gentle range, then rythmic stabs for  IR/ER in scaption and flex/ext @ 79                PT Education - 04/22/14 1128    Education provided Yes   Education Details HEP   Person(s) Educated Patient   Methods Explanation;Handout;Demonstration   Comprehension Verbalized understanding;Returned demonstration          PT Short Term Goals - 04/07/14 1036    PT SHORT TERM GOAL #1   Title Ind. with HP.   Time 6   Period Weeks   Status On-going   PT SHORT TERM GOAL #2   Title Active right shoulder flexion to 145 degrees so the patient can easily reach overhead.   Time 6   Period Weeks   Status On-going   PT SHORT TERM GOAL #3   Title Active ER to 70 degrees+ to allow for easily donning/doffing of apparel   Time 6   Period Weeks   Status Achieved                  Plan - 04/22/14 1131    Clinical Impression Statement patient tolerated tx well  today and some improvement with right shoulder flexion, yet goals ongoing. HEP given for gentle strengthening.   Pt will benefit from skilled therapeutic intervention in order to improve on the following deficits Pain;Decreased strength   Rehab Potential Good   PT Frequency 2x / week   PT Duration 6 weeks   PT Treatment/Interventions Electrical Stimulation;Patient/family education;Therapeutic exercise;Passive range of motion;Ultrasound   PT Next Visit Plan continue strengthening, monitor for substitutions.   Consulted and Agree with Plan of Care Patient        Problem List Patient Active Problem List   Diagnosis Date Noted  . Rotator cuff tear 03/23/2014  . Osteoarthritis   . Asthma   . Ovarian cancer   . COPD (chronic obstructive pulmonary disease)   . Fibromyalgia   . RLS (restless legs syndrome)   . Cerebral vasculitis   . Sjogren's syndrome   . Raynaud's disease   . MVP (mitral valve prolapse)   . IBS (irritable bowel syndrome)   . Congestive dilated cardiomyopathy 02/09/2014  . Dyspnea 12/13/2013  . Hyperlipidemia 11/26/2013  . Malaise and fatigue 11/26/2013  . Multiple drug allergies 11/02/2013  . History of ovarian cancer 10/01/2013  . GERD (gastroesophageal reflux disease) 10/01/2013  . Benign essential HTN 10/01/2013  . Atrial fibrillation 07/27/2013  . Long term (current) use of anticoagulants 07/27/2013    DUNFORD, CHRISTINA P, PTA 04/22/2014, 11:36 AM  [facility] [place], [place], [address] Phone: [phone]   Fax:  [phone]

## 2014-04-22 NOTE — Patient Instructions (Signed)
ROM: Saw (Protraction / Retraction)   Reach right arm out in front, then pull arm back, pinching shoulder blades together. Repeat __10__ times per set. Do __1-3__ sets per session. Do _2___ sessions per day.  http://orth.exer.us/796   Copyright  VHI. All rights reserved.  ROM: Flexion (Alternate)   Slide right arm up wall, with palm out, by leaning toward wall. Hold __5__ seconds. Repeat __10__ times per set. Do _2___ sets per session. Do _2___ sessions per day.  http://orth.exer.us/758   Copyright  VHI. All rights reserved.  Progressive Resisted: Flexion (Supine)   Holding __NO__ pound weight, raise arms over head and lower toward floor. Go as far as possible without pain. Repeat _10___ times per set. Do __1-3__ sets per session. Do _2___ sessions per day.  http://orth.exer.us/868   Copyright  VHI. All rights reserved.

## 2014-04-26 ENCOUNTER — Encounter: Payer: Self-pay | Admitting: Physical Therapy

## 2014-04-26 ENCOUNTER — Ambulatory Visit: Payer: Medicare Other | Admitting: Physical Therapy

## 2014-04-26 DIAGNOSIS — M25511 Pain in right shoulder: Secondary | ICD-10-CM

## 2014-04-26 DIAGNOSIS — M25611 Stiffness of right shoulder, not elsewhere classified: Secondary | ICD-10-CM

## 2014-04-26 NOTE — Therapy (Signed)
[facility] [place], [place], [address] Phone: [phone]   Fax:  [phone]  Physical Therapy Treatment  Patient Details  Name: Angela Wong MRN: [id number] Date of Birth: [birth year] Referring Provider:  [name], FNP  Encounter Date: 04/26/2014      PT End of Session - 04/26/14 1026    Visit Number 8   Number of Visits 12   Date for PT Re-Evaluation 05/10/14   PT Start Time 0944   PT Stop Time 1044   PT Time Calculation (min) 60 min   Activity Tolerance Patient tolerated treatment well   Behavior During Therapy [facility] for tasks assessed/performed      Past Medical History  Diagnosis Date  . A-fib   . Sjogren's syndrome   . Raynaud's disease   . RLS (restless legs syndrome)   . IBS (irritable bowel syndrome)   . MVP (mitral valve prolapse)   . Cerebral vasculitis   . Osteoarthritis   . Asthma   . Ovarian cancer     lymph node removal with hysterectomy  . COPD (chronic obstructive pulmonary disease)   . GERD (gastroesophageal reflux disease)   . Fibromyalgia     Past Surgical History  Procedure Laterality Date  . Appendectomy    . Abdominal hysterectomy    . Cataract extraction Left   . Breast surgery      Biopsy    There were no vitals filed for this visit.  Visit Diagnosis:  Shoulder stiffness, right  Right shoulder pain      Subjective Assessment - 04/26/14 0950    Subjective some soreness in shoulder after vacuuming yesterday   Currently in Pain? Yes   Pain Score 2    Pain Location Shoulder   Pain Orientation Right   Pain Descriptors / Indicators Sore   Pain Type Chronic pain   Pain Onset 1 to 4 weeks ago   Pain Frequency Occasional   Aggravating Factors  ADL's or increaed activity with right UE   Pain Relieving Factors rest            [facility] PT Assessment - 04/26/14 0001    AROM   Overall AROM  Deficits   AROM Assessment Site Shoulder   Right/Left Shoulder Right    Right Shoulder Flexion 108 Degrees   Right Shoulder External Rotation 75 Degrees                   [facility] Adult PT Treatment/Exercise - 04/26/14 0001    Shoulder Exercises: Supine   Flexion AROM;Right;Strengthening  BIL UE 2x10   Shoulder Exercises: Seated   Other Seated Exercises cane for flexion and chest press 2x10 each   Shoulder Exercises: Sidelying   External Rotation Weight (lbs) 1#  3x10   Shoulder Exercises: Standing   Other Standing Exercises --  RW4 with yellow t-band 2x10 each   Shoulder Exercises: Pulleys   Flexion --  5 min   Shoulder Exercises: ROM/Strengthening   UBE (Upper Arm Bike) 8 min 90RPM   Modalities   Modalities Electrical Stimulation;Moist Heat   Moist Heat Therapy   Number Minutes Moist Heat 15 Minutes   Moist Heat Location Shoulder   Electrical Stimulation   Electrical Stimulation Location Right shoulder   Electrical Stimulation Action 5/5   Electrical Stimulation Parameters premod   Electrical Stimulation Goals Pain                  PT Short Term  Goals - 04/07/14 1036    PT SHORT TERM GOAL #1   Title Ind. with HP.   Time 6   Period Weeks   Status On-going   PT SHORT TERM GOAL #2   Title Active right shoulder flexion to 145 degrees so the patient can easily reach overhead.   Time 6   Period Weeks   Status On-going   PT SHORT TERM GOAL #3   Title Active ER to 70 degrees+ to allow for easily donning/doffing of apparel   Time 6   Period Weeks   Status Achieved                  Plan - 04/26/14 1028    Clinical Impression Statement patint continues to progress with all activities. Has less pain today and has improved AROM in Right shoulder.goals ongoing   Pt will benefit from skilled therapeutic intervention in order to improve on the following deficits Pain;Decreased strength   Rehab Potential Good   PT Frequency 2x / week   PT Duration 6 weeks   PT Treatment/Interventions Electrical  Stimulation;Patient/family education;Therapeutic exercise;Passive range of motion;Ultrasound   PT Next Visit Plan continue strengthening, monitor for substitutions.   Consulted and Agree with Plan of Care Patient        Problem List Patient Active Problem List   Diagnosis Date Noted  . Rotator cuff tear 03/23/2014  . Osteoarthritis   . Asthma   . Ovarian cancer   . COPD (chronic obstructive pulmonary disease)   . Fibromyalgia   . RLS (restless legs syndrome)   . Cerebral vasculitis   . Sjogren's syndrome   . Raynaud's disease   . MVP (mitral valve prolapse)   . IBS (irritable bowel syndrome)   . Congestive dilated cardiomyopathy 02/09/2014  . Dyspnea 12/13/2013  . Hyperlipidemia 11/26/2013  . Malaise and fatigue 11/26/2013  . Multiple drug allergies 11/02/2013  . History of ovarian cancer 10/01/2013  . GERD (gastroesophageal reflux disease) 10/01/2013  . Benign essential HTN 10/01/2013  . Atrial fibrillation 07/27/2013  . Long term (current) use of anticoagulants 07/27/2013    Byran Bilotti P, PTA 04/26/2014, 11:00 AM  [facility] [place], [place], [address] Phone: [phone]   Fax:  [phone]

## 2014-04-29 ENCOUNTER — Ambulatory Visit: Payer: Medicare Other | Admitting: Physical Therapy

## 2014-04-29 ENCOUNTER — Encounter: Payer: Self-pay | Admitting: Physical Therapy

## 2014-04-29 DIAGNOSIS — M25511 Pain in right shoulder: Secondary | ICD-10-CM

## 2014-04-29 DIAGNOSIS — M25611 Stiffness of right shoulder, not elsewhere classified: Secondary | ICD-10-CM

## 2014-04-29 NOTE — Therapy (Signed)
[facility] [place], [place], [address] Phone: [phone]   Fax:  [phone]  Physical Therapy Treatment  Patient Details  Name: Angela Wong MRN: [id number] Date of Birth: 1940/03/21 Referring Provider:  [name], FNP  Encounter Date: 04/29/2014      PT End of Session - 04/29/14 1016    Visit Number 9   Number of Visits 12   Date for PT Re-Evaluation 05/10/14   PT Start Time 0946   PT Stop Time 1045   PT Time Calculation (min) 59 min   Activity Tolerance Patient tolerated treatment well   Behavior During Therapy [facility] for tasks assessed/performed      Past Medical History  Diagnosis Date  . A-fib   . Sjogren's syndrome   . Raynaud's disease   . RLS (restless legs syndrome)   . IBS (irritable bowel syndrome)   . MVP (mitral valve prolapse)   . Cerebral vasculitis   . Osteoarthritis   . Asthma   . Ovarian cancer     lymph node removal with hysterectomy  . COPD (chronic obstructive pulmonary disease)   . GERD (gastroesophageal reflux disease)   . Fibromyalgia     Past Surgical History  Procedure Laterality Date  . Appendectomy    . Abdominal hysterectomy    . Cataract extraction Left   . Breast surgery      Biopsy    There were no vitals filed for this visit.  Visit Diagnosis:  Shoulder stiffness, right  Right shoulder pain      Subjective Assessment - 04/29/14 0952    Subjective patient reported a lot of house work yesterday such as changing bed sheets and plowing/pulling weeds outside   Currently in Pain? Yes   Pain Score 2    Pain Location Shoulder   Pain Orientation Right   Pain Descriptors / Indicators Sore   Pain Type Chronic pain   Pain Onset 1 to 4 weeks ago   Aggravating Factors  increased activity   Pain Relieving Factors rest            [facility] PT Assessment - 04/29/14 0001    AROM   Overall AROM  Deficits   AROM Assessment Site Shoulder   Right/Left Shoulder  Right   Right Shoulder Flexion 90 Degrees   PROM   Overall PROM  Deficits   PROM Assessment Site Shoulder   Right/Left Shoulder Right   Right Shoulder Flexion 151 Degrees                   [facility] Adult PT Treatment/Exercise - 04/29/14 0001    Shoulder Exercises: Seated   Other Seated Exercises cane for flexion and chest press 2x10 each   Shoulder Exercises: Standing   Other Standing Exercises RW4 with yellow t-band 2x10 each   Shoulder Exercises: Pulleys   Flexion --  5 min   Shoulder Exercises: ROM/Strengthening   UBE (Upper Arm Bike) 8 min 90RPM   Modalities   Modalities Electrical Stimulation;Moist Heat   Moist Heat Therapy   Number Minutes Moist Heat 15 Minutes   Moist Heat Location Shoulder   Electrical Stimulation   Electrical Stimulation Location Right shoulder   Electrical Stimulation Action 5/5   Electrical Stimulation Parameters premod   Electrical Stimulation Goals Pain   Manual Therapy   Manual Therapy Passive ROM   Passive ROM for flexion with gentle range  PT Short Term Goals - 04/07/14 1036    PT SHORT TERM GOAL #1   Title Ind. with HP.   Time 6   Period Weeks   Status On-going   PT SHORT TERM GOAL #2   Title Active right shoulder flexion to 145 degrees so the patient can easily reach overhead.   Time 6   Period Weeks   Status On-going   PT SHORT TERM GOAL #3   Title Active ER to 70 degrees+ to allow for easily donning/doffing of apparel   Time 6   Period Weeks   Status Achieved                  Plan - 04/29/14 1018    Clinical Impression Statement patient continues to progress with all activities and has improved with both passive and AROM today. no further goals met today.   Pt will benefit from skilled therapeutic intervention in order to improve on the following deficits Pain;Decreased strength   Rehab Potential Good   PT Frequency 2x / week   PT Duration 6 weeks   PT Treatment/Interventions  Electrical Stimulation;Patient/family education;Therapeutic exercise;Passive range of motion;Ultrasound   PT Next Visit Plan continue strengthening, monitor for substitutions.   Consulted and Agree with Plan of Care Patient        Problem List Patient Active Problem List   Diagnosis Date Noted  . Rotator cuff tear 03/23/2014  . Osteoarthritis   . Asthma   . Ovarian cancer   . COPD (chronic obstructive pulmonary disease)   . Fibromyalgia   . RLS (restless legs syndrome)   . Cerebral vasculitis   . Sjogren's syndrome   . Raynaud's disease   . MVP (mitral valve prolapse)   . IBS (irritable bowel syndrome)   . Congestive dilated cardiomyopathy 02/09/2014  . Dyspnea 12/13/2013  . Hyperlipidemia 11/26/2013  . Malaise and fatigue 11/26/2013  . Multiple drug allergies 11/02/2013  . History of ovarian cancer 10/01/2013  . GERD (gastroesophageal reflux disease) 10/01/2013  . Benign essential HTN 10/01/2013  . Atrial fibrillation 07/27/2013  . Long term (current) use of anticoagulants 07/27/2013    Honi Name P, PTA 04/29/2014, 11:17 AM  [facility] [place], [place], [address] Phone: [phone]   Fax:  [phone]

## 2014-05-02 ENCOUNTER — Encounter: Payer: Self-pay | Admitting: Physical Therapy

## 2014-05-02 ENCOUNTER — Ambulatory Visit: Payer: Medicare Other | Admitting: Physical Therapy

## 2014-05-02 DIAGNOSIS — M25511 Pain in right shoulder: Secondary | ICD-10-CM

## 2014-05-02 DIAGNOSIS — M25611 Stiffness of right shoulder, not elsewhere classified: Secondary | ICD-10-CM

## 2014-05-02 NOTE — Therapy (Signed)
[facility] [place], [place], [address] Phone: [phone]   Fax:  [phone]  Physical Therapy Treatment  Patient Details  Name: Angela Wong MRN: [id number] Date of Birth: June 23, 1940 Referring Provider:  [name], FNP  Encounter Date: 05/02/2014      PT End of Session - 05/02/14 1020    Visit Number 10   Number of Visits 12   Date for PT Re-Evaluation 05/10/14   PT Start Time 0944   PT Stop Time 1043   PT Time Calculation (min) 59 min   Activity Tolerance Patient tolerated treatment well   Behavior During Therapy [facility] for tasks assessed/performed      Past Medical History  Diagnosis Date  . A-fib   . Sjogren's syndrome   . Raynaud's disease   . RLS (restless legs syndrome)   . IBS (irritable bowel syndrome)   . MVP (mitral valve prolapse)   . Cerebral vasculitis   . Osteoarthritis   . Asthma   . Ovarian cancer     lymph node removal with hysterectomy  . COPD (chronic obstructive pulmonary disease)   . GERD (gastroesophageal reflux disease)   . Fibromyalgia     Past Surgical History  Procedure Laterality Date  . Appendectomy    . Abdominal hysterectomy    . Cataract extraction Left   . Breast surgery      Biopsy    There were no vitals filed for this visit.  Visit Diagnosis:  Shoulder stiffness, right  Right shoulder pain      Subjective Assessment - 05/02/14 1001    Subjective patient spent a few hours doing yard work yesterday, able to lift 8 1/2 lb bad of dog food, able to drive a little better without difficuly while turning wheel and was able to reach up to a shelf for items.   Currently in Pain? Yes   Pain Score 3    Pain Location Shoulder   Pain Orientation Right   Pain Descriptors / Indicators Sore   Pain Type Chronic pain   Pain Onset 1 to 4 weeks ago   Aggravating Factors  increased activity and ADL's   Pain Relieving Factors rest            [facility] PT  Assessment - 05/02/14 0001    AROM   AROM Assessment Site Shoulder   Right/Left Shoulder Right   Right Shoulder Flexion 110 Degrees                   [facility] Adult PT Treatment/Exercise - 05/02/14 0001    Shoulder Exercises: Seated   Flexion AROM;Right;20 reps   Other Seated Exercises cane for flexion and chest press 2x10 each   Shoulder Exercises: Standing   Other Standing Exercises RW4 with yellow t-band 2x10 each   Other Standing Exercises wall slide with eccentic lowering x fatigue   Shoulder Exercises: Pulleys   Flexion --  5 min   Shoulder Exercises: ROM/Strengthening   UBE (Upper Arm Bike) 8 min 90RPM   Moist Heat Therapy   Number Minutes Moist Heat 15 Minutes   Moist Heat Location Shoulder   Electrical Stimulation   Electrical Stimulation Location Right shoulder   Electrical Stimulation Action 5/5   Electrical Stimulation Parameters premod   Electrical Stimulation Goals Pain                  PT Short Term Goals - 04/07/14 1036    PT SHORT  TERM GOAL #1   Title Ind. with HP.   Time 6   Period Weeks   Status On-going   PT SHORT TERM GOAL #2   Title Active right shoulder flexion to 145 degrees so the patient can easily reach overhead.   Time 6   Period Weeks   Status On-going   PT SHORT TERM GOAL #3   Title Active ER to 70 degrees+ to allow for easily donning/doffing of apparel   Time 6   Period Weeks   Status Achieved                  Plan - 05/02/14 1033    Clinical Impression Statement patient continues to progress with strength and ROM. patient has been able to perform ADL's with ease and able to reach better with less difficulty. goals progressing yet ongoing.   Pt will benefit from skilled therapeutic intervention in order to improve on the following deficits Pain;Decreased strength   Rehab Potential Good   PT Frequency 2x / week   PT Duration 6 weeks   PT Treatment/Interventions Electrical Stimulation;Patient/family  education;Therapeutic exercise;Passive range of motion;Ultrasound   PT Next Visit Plan continue strengthening, monitor for substitutions.   Consulted and Agree with Plan of Care Patient        Problem List Patient Active Problem List   Diagnosis Date Noted  . Rotator cuff tear 03/23/2014  . Osteoarthritis   . Asthma   . Ovarian cancer   . COPD (chronic obstructive pulmonary disease)   . Fibromyalgia   . RLS (restless legs syndrome)   . Cerebral vasculitis   . Sjogren's syndrome   . Raynaud's disease   . MVP (mitral valve prolapse)   . IBS (irritable bowel syndrome)   . Congestive dilated cardiomyopathy 02/09/2014  . Dyspnea 12/13/2013  . Hyperlipidemia 11/26/2013  . Malaise and fatigue 11/26/2013  . Multiple drug allergies 11/02/2013  . History of ovarian cancer 10/01/2013  . GERD (gastroesophageal reflux disease) 10/01/2013  . Benign essential HTN 10/01/2013  . Atrial fibrillation 07/27/2013  . Long term (current) use of anticoagulants 07/27/2013   Ladean Raya, PTA 05/02/2014 10:47 AM Lendell Gallick, Venetia Maxon, PTA 05/02/2014, 10:46 AM  [facility] [address] [place], [place], [address] Phone: [phone]   Fax:  [phone]

## 2014-05-06 ENCOUNTER — Ambulatory Visit: Payer: Medicare Other | Admitting: Physical Therapy

## 2014-05-06 ENCOUNTER — Encounter: Payer: Self-pay | Admitting: Physical Therapy

## 2014-05-06 DIAGNOSIS — M25511 Pain in right shoulder: Secondary | ICD-10-CM | POA: Diagnosis not present

## 2014-05-06 DIAGNOSIS — M25611 Stiffness of right shoulder, not elsewhere classified: Secondary | ICD-10-CM

## 2014-05-06 NOTE — Therapy (Signed)
[facility] [place], [place], [address] Phone: [phone]   Fax:  [phone]  Physical Therapy Treatment  Patient Details  Name: Angela Wong MRN: [id number] Date of Birth: [birth year] Referring Provider:  [name], FNP  Encounter Date: 05/06/2014      PT End of Session - 05/06/14 1018    Visit Number 11   Number of Visits 12   Date for PT Re-Evaluation 05/10/14   PT Start Time 0945   PT Stop Time 1448   PT Time Calculation (min) 59 min   Activity Tolerance Patient tolerated treatment well   Behavior During Therapy [facility] for tasks assessed/performed      Past Medical History  Diagnosis Date  . A-fib   . Sjogren's syndrome   . Raynaud's disease   . RLS (restless legs syndrome)   . IBS (irritable bowel syndrome)   . MVP (mitral valve prolapse)   . Cerebral vasculitis   . Osteoarthritis   . Asthma   . Ovarian cancer     lymph node removal with hysterectomy  . COPD (chronic obstructive pulmonary disease)   . GERD (gastroesophageal reflux disease)   . Fibromyalgia     Past Surgical History  Procedure Laterality Date  . Appendectomy    . Abdominal hysterectomy    . Cataract extraction Left   . Breast surgery      Biopsy    There were no vitals filed for this visit.  Visit Diagnosis:  Shoulder stiffness, right  Right shoulder pain      Subjective Assessment - 05/06/14 0948    Subjective was working in garden a lot. feeling very tired today.   Currently in Pain? Yes   Pain Score 3    Pain Location Shoulder   Pain Orientation Right   Pain Descriptors / Indicators Sore   Pain Type Chronic pain   Pain Onset 1 to 4 weeks ago   Aggravating Factors  increased activity   Pain Relieving Factors rest            [facility] PT Assessment - 05/06/14 0001    AROM   AROM Assessment Site --   Right/Left Shoulder --                     [facility] Adult PT Treatment/Exercise - 05/06/14  0001    Shoulder Exercises: Seated   Other Seated Exercises cane for flexion and chest press 2x10 each   Shoulder Exercises: Standing   External Rotation Strengthening;Right;10 reps   External Rotation Weight (lbs) 2#    External Rotation Limitations 3sets   Other Standing Exercises RW4 with yellow t-band 3x10 each   Other Standing Exercises wall slide with eccentic lowering x fatigue   Shoulder Exercises: Pulleys   Flexion --  77min   Shoulder Exercises: ROM/Strengthening   UBE (Upper Arm Bike) 8 min 90RPM   Moist Heat Therapy   Number Minutes Moist Heat 15 Minutes   Moist Heat Location Shoulder   Electrical Stimulation   Electrical Stimulation Location Right shoulder   Electrical Stimulation Action 5/5   Electrical Stimulation Parameters premod   Electrical Stimulation Goals Pain                  PT Short Term Goals - 04/07/14 1036    PT SHORT TERM GOAL #1   Title Ind. with HP.   Time 6   Period Weeks   Status On-going   PT  SHORT TERM GOAL #2   Title Active right shoulder flexion to 145 degrees so the patient can easily reach overhead.   Time 6   Period Weeks   Status On-going   PT SHORT TERM GOAL #3   Title Active ER to 70 degrees+ to allow for easily donning/doffing of apparel   Time 6   Period Weeks   Status Achieved                  Plan - 05/06/14 1019    Clinical Impression Statement patient tolerated treatment well and has no increased pain reports. patient has less shoulder compensation with ex's and improved technique today. flexion ROM goal ongoing due to weakness.   Pt will benefit from skilled therapeutic intervention in order to improve on the following deficits Pain;Decreased strength   Rehab Potential Good   PT Frequency 2x / week   PT Duration 6 weeks   PT Treatment/Interventions Electrical Stimulation;Patient/family education;Therapeutic exercise;Passive range of motion;Ultrasound   PT Next Visit Plan continue strengthening /  awaiting renewal to be signed   Consulted and Agree with Plan of Care Patient        Problem List Patient Active Problem List   Diagnosis Date Noted  . Rotator cuff tear 03/23/2014  . Osteoarthritis   . Asthma   . Ovarian cancer   . COPD (chronic obstructive pulmonary disease)   . Fibromyalgia   . RLS (restless legs syndrome)   . Cerebral vasculitis   . Sjogren's syndrome   . Raynaud's disease   . MVP (mitral valve prolapse)   . IBS (irritable bowel syndrome)   . Congestive dilated cardiomyopathy 02/09/2014  . Dyspnea 12/13/2013  . Hyperlipidemia 11/26/2013  . Malaise and fatigue 11/26/2013  . Multiple drug allergies 11/02/2013  . History of ovarian cancer 10/01/2013  . GERD (gastroesophageal reflux disease) 10/01/2013  . Benign essential HTN 10/01/2013  . Atrial fibrillation 07/27/2013  . Long term (current) use of anticoagulants 07/27/2013    Christiaan Strebeck P, PTA 05/06/2014, 10:49 AM  [facility] [place], [place], [address] Phone: [phone]   Fax:  [phone]

## 2014-05-09 ENCOUNTER — Ambulatory Visit (INDEPENDENT_AMBULATORY_CARE_PROVIDER_SITE_OTHER): Payer: Medicare Other | Admitting: Pharmacist

## 2014-05-09 ENCOUNTER — Ambulatory Visit: Payer: Medicare Other | Admitting: Physical Therapy

## 2014-05-09 ENCOUNTER — Encounter: Payer: Self-pay | Admitting: Physical Therapy

## 2014-05-09 VITALS — BP 118/76

## 2014-05-09 DIAGNOSIS — I480 Paroxysmal atrial fibrillation: Secondary | ICD-10-CM

## 2014-05-09 DIAGNOSIS — M25511 Pain in right shoulder: Secondary | ICD-10-CM

## 2014-05-09 DIAGNOSIS — I482 Chronic atrial fibrillation, unspecified: Secondary | ICD-10-CM

## 2014-05-09 DIAGNOSIS — Z7901 Long term (current) use of anticoagulants: Secondary | ICD-10-CM

## 2014-05-09 DIAGNOSIS — M25611 Stiffness of right shoulder, not elsewhere classified: Secondary | ICD-10-CM

## 2014-05-09 LAB — POCT INR: INR: 1.9

## 2014-05-09 NOTE — Patient Instructions (Signed)
Anticoagulation Dose Instructions as of 05/09/2014      Dorene Grebe Tue Wed Thu Fri Sat   New Dose 2 mg 2 mg 2 mg 2 mg 2 mg 2 mg 4 mg    Description        Take 4 mg today (4/25) and then continue warfarin dose of  4mg  on saturdays and 2mg  all other days.

## 2014-05-09 NOTE — Therapy (Signed)
[facility] [place], [place], [address] Phone: [phone]   Fax:  [phone]  Physical Therapy Treatment  Patient Details  Name: Angela Wong MRN: [id number] Date of Birth: [birth year] Referring Provider:  [name], FNP  Encounter Date: 05/09/2014      PT End of Session - 05/09/14 1026    Visit Number 12   Number of Visits 12   Date for PT Re-Evaluation 05/10/14   PT Start Time 0945   PT Stop Time 1043   PT Time Calculation (min) 58 min   Activity Tolerance Patient tolerated treatment well   Behavior During Therapy [facility] for tasks assessed/performed      Past Medical History  Diagnosis Date  . A-fib   . Sjogren's syndrome   . Raynaud's disease   . RLS (restless legs syndrome)   . IBS (irritable bowel syndrome)   . MVP (mitral valve prolapse)   . Cerebral vasculitis   . Osteoarthritis   . Asthma   . Ovarian cancer     lymph node removal with hysterectomy  . COPD (chronic obstructive pulmonary disease)   . GERD (gastroesophageal reflux disease)   . Fibromyalgia     Past Surgical History  Procedure Laterality Date  . Appendectomy    . Abdominal hysterectomy    . Cataract extraction Left   . Breast surgery      Biopsy    There were no vitals filed for this visit.  Visit Diagnosis:  Shoulder stiffness, right  Right shoulder pain      Subjective Assessment - 05/09/14 0955    Subjective cleaned house and had no pain, yet drove and had some pain after (very nervous/stiff driver with 'deathgrip")   Currently in Pain? Yes   Pain Score 3    Pain Location Shoulder   Pain Orientation Right   Pain Descriptors / Indicators Sore   Pain Type Chronic pain   Pain Onset 1 to 4 weeks ago   Aggravating Factors  driving   Pain Relieving Factors rest            [facility] PT Assessment - 05/09/14 0001    AROM   Overall AROM  Deficits   AROM Assessment Site Shoulder   Right/Left Shoulder Right   Right Shoulder Flexion 115 Degrees                     [facility] Adult PT Treatment/Exercise - 05/09/14 0001    Shoulder Exercises: Seated   Flexion AROM;Strengthening;Right  3x10   Shoulder Exercises: Standing   External Rotation Strengthening;Right;10 reps   External Rotation Weight (lbs) 2#    External Rotation Limitations 3sets   Other Standing Exercises RW4 with yellow t-band 3x10 each   Other Standing Exercises wall slide with eccentic lowering x fatigue   Shoulder Exercises: Pulleys   Flexion --  59mn   Shoulder Exercises: ROM/Strengthening   UBE (Upper Arm Bike) 8 min 90RPM   Moist Heat Therapy   Number Minutes Moist Heat 15 Minutes   Moist Heat Location Shoulder   Electrical Stimulation   Electrical Stimulation Location Right shoulder   Electrical Stimulation Action 5/5   Electrical Stimulation Parameters premod   Electrical Stimulation Goals Pain                PT Education - 05/09/14 1021    Education provided Yes   Education Details HEP RW4   Person(s) Educated Patient   Methods Explanation;Demonstration;Handout  Comprehension Verbalized understanding;Returned demonstration          PT Short Term Goals - 05/09/14 1031    PT SHORT TERM GOAL #1   Title Ind. with HP.   Time 6   Period Weeks   Status Achieved   PT SHORT TERM GOAL #2   Title Active right shoulder flexion to 145 degrees so the patient can easily reach overhead.   Time 6   Period Weeks   Status On-going   PT SHORT TERM GOAL #3   Title Active ER to 70 degrees+ to allow for easily donning/doffing of apparel   Time 6   Period Weeks   Status Achieved                  Plan - 05/09/14 1027    Clinical Impression Statement patient continues to progress this week with improved AROM. ROM is limited due to weakness. Able to get PROM within normal limits yet not actively. patient understands all HEP activities. Met STG #1 today, ROM ongoing.   Pt will benefit from  skilled therapeutic intervention in order to improve on the following deficits Pain;Decreased strength   Rehab Potential Good   PT Frequency 2x / week   PT Duration 6 weeks   PT Treatment/Interventions Electrical Stimulation;Patient/family education;Therapeutic exercise;Passive range of motion;Ultrasound   PT Next Visit Plan continue strengthening / awaiting renewal to be signed (patient is calling [facility] to send order) would like to continue for strengthening/MPT agree   Consulted and Agree with Plan of Care Patient        Problem List Patient Active Problem List   Diagnosis Date Noted  . Rotator cuff tear 03/23/2014  . Osteoarthritis   . Asthma   . Ovarian cancer   . COPD (chronic obstructive pulmonary disease)   . Fibromyalgia   . RLS (restless legs syndrome)   . Cerebral vasculitis   . Sjogren's syndrome   . Raynaud's disease   . MVP (mitral valve prolapse)   . IBS (irritable bowel syndrome)   . Congestive dilated cardiomyopathy 02/09/2014  . Dyspnea 12/13/2013  . Hyperlipidemia 11/26/2013  . Malaise and fatigue 11/26/2013  . Multiple drug allergies 11/02/2013  . History of ovarian cancer 10/01/2013  . GERD (gastroesophageal reflux disease) 10/01/2013  . Benign essential HTN 10/01/2013  . Atrial fibrillation 07/27/2013  . Long term (current) use of anticoagulants 07/27/2013    Keeana Pieratt P, PTA 05/09/2014, 10:43 AM  [facility] [place], [place], [address] Phone: [phone]   Fax:  [phone]

## 2014-05-09 NOTE — Patient Instructions (Signed)
  Strengthening: Resisted Flexion   Hold tubing with left arm at side. Pull forward and up. Move shoulder through pain-free range of motion. Repeat __10__ times per set. Do _2-3___ sets per session. Do _2-3___ sessions per day. http://orth.exer.us/824   Copyright  VHI. All rights reserved.  Strengthening: Resisted Extension   Hold tubing in right hand, arm forward. Pull arm back, elbow straight. Repeat __10__ times per set. Do _2-3___ sets per session. Do _2-3___ sessions per day.  http://orth.exer.us/832   Copyright  VHI. All rights reserved.  Strengthening: Resisted Internal Rotation   Hold tubing in left hand, elbow at side and forearm out. Rotate forearm in across body. Repeat __10__ times per set. Do _2-3___ sets per session. Do _2-3___ sessions per day.  http://orth.exer.us/830   Copyright  VHI. All rights reserved.  Strengthening: Resisted External Rotation   Hold tubing in right hand, elbow at side and forearm across body. Rotate forearm out. Repeat __10__ times per set. Do __2-3__ sets per session. Do ____ sessions per day.  http://orth.exer.us/828   Copyright  VHI. All rights reserved.    

## 2014-05-13 ENCOUNTER — Encounter: Payer: Self-pay | Admitting: Physical Therapy

## 2014-05-13 ENCOUNTER — Ambulatory Visit: Payer: Medicare Other | Admitting: Physical Therapy

## 2014-05-13 DIAGNOSIS — M25511 Pain in right shoulder: Secondary | ICD-10-CM

## 2014-05-13 DIAGNOSIS — M25611 Stiffness of right shoulder, not elsewhere classified: Secondary | ICD-10-CM

## 2014-05-13 NOTE — Therapy (Signed)
[facility] [place], [place], [address] Phone: [phone]   Fax:  [phone]  Physical Therapy Treatment  Patient Details  Name: Angela Wong MRN: [id number] Date of Birth: November 13, 1940 Referring Provider:  [name], FNP  Encounter Date: 05/13/2014      PT End of Session - 05/13/14 1025    Visit Number 13   Date for PT Re-Evaluation 05/25/14   PT Start Time 0945   PT Stop Time 1829   PT Time Calculation (min) 59 min   Activity Tolerance Patient tolerated treatment well   Behavior During Therapy [facility] for tasks assessed/performed      Past Medical History  Diagnosis Date  . A-fib   . Sjogren's syndrome   . Raynaud's disease   . RLS (restless legs syndrome)   . IBS (irritable bowel syndrome)   . MVP (mitral valve prolapse)   . Cerebral vasculitis   . Osteoarthritis   . Asthma   . Ovarian cancer     lymph node removal with hysterectomy  . COPD (chronic obstructive pulmonary disease)   . GERD (gastroesophageal reflux disease)   . Fibromyalgia     Past Surgical History  Procedure Laterality Date  . Appendectomy    . Abdominal hysterectomy    . Cataract extraction Left   . Breast surgery      Biopsy    There were no vitals filed for this visit.  Visit Diagnosis:  Shoulder stiffness, right  Right shoulder pain      Subjective Assessment - 05/13/14 0947    Subjective cleaned house, vaccumed and did laundry with minimal pain   Currently in Pain? Yes   Pain Score 4    Pain Location Shoulder   Pain Orientation Right   Pain Descriptors / Indicators Sore;Aching   Pain Type Chronic pain   Pain Onset 1 to 4 weeks ago   Aggravating Factors  driving   Pain Relieving Factors rest            [facility] PT Assessment - 05/13/14 0001    AROM   Overall AROM  Deficits   AROM Assessment Site Shoulder   Right/Left Shoulder Right   Right Shoulder Flexion 112 Degrees  fatigue / sore from ADL's                      [facility] Adult PT Treatment/Exercise - 05/13/14 0001    Shoulder Exercises: Prone   Other Prone Exercises kneeling for 2# rows/ ext/ horiz abd 3x10 each   Shoulder Exercises: Standing   Other Standing Exercises RW4 with yellow t-band 3x10 each   Other Standing Exercises wall slide with eccentic lowering x fatigue   Shoulder Exercises: Pulleys   Flexion --  68min   Shoulder Exercises: Therapy Ball   Other Therapy Ball Exercises 2# D2/D1 2x10   Shoulder Exercises: ROM/Strengthening   UBE (Upper Arm Bike) 8 min 90RPM   Moist Heat Therapy   Number Minutes Moist Heat 15 Minutes   Moist Heat Location Shoulder   Electrical Stimulation   Electrical Stimulation Location Right shoulder   Electrical Stimulation Action 5/5   Electrical Stimulation Parameters premod   Electrical Stimulation Goals Pain                  PT Short Term Goals - 05/09/14 1031    PT SHORT TERM GOAL #1   Title Ind. with HP.   Time 6   Period Weeks  Status Achieved   PT SHORT TERM GOAL #2   Title Active right shoulder flexion to 145 degrees so the patient can easily reach overhead.   Time 6   Period Weeks   Status On-going   PT SHORT TERM GOAL #3   Title Active ER to 70 degrees+ to allow for easily donning/doffing of apparel   Time 6   Period Weeks   Status Achieved                  Plan - 05/13/14 1027    Clinical Impression Statement patient progressing with strengthening activities and able to tolerate ADL's with 4/10 soreness. patient understands advanced HEP and will continue per tolerance. goals ongoing   Pt will benefit from skilled therapeutic intervention in order to improve on the following deficits Pain;Decreased strength   Rehab Potential Good   PT Frequency 2x / week   PT Duration 6 weeks   PT Treatment/Interventions Electrical Stimulation;Patient/family education;Therapeutic exercise;Passive range of motion;Ultrasound   PT Next Visit Plan  cont with POC for 2 weeks per MD. [facility] N.O.   Consulted and Agree with Plan of Care Patient        Problem List Patient Active Problem List   Diagnosis Date Noted  . Rotator cuff tear 03/23/2014  . Osteoarthritis   . Asthma   . Ovarian cancer   . COPD (chronic obstructive pulmonary disease)   . Fibromyalgia   . RLS (restless legs syndrome)   . Cerebral vasculitis   . Sjogren's syndrome   . Raynaud's disease   . MVP (mitral valve prolapse)   . IBS (irritable bowel syndrome)   . Congestive dilated cardiomyopathy 02/09/2014  . Dyspnea 12/13/2013  . Hyperlipidemia 11/26/2013  . Malaise and fatigue 11/26/2013  . Multiple drug allergies 11/02/2013  . History of ovarian cancer 10/01/2013  . GERD (gastroesophageal reflux disease) 10/01/2013  . Benign essential HTN 10/01/2013  . Atrial fibrillation 07/27/2013  . Long term (current) use of anticoagulants 07/27/2013    Neta Upadhyay P, PTA 05/13/2014, 10:45 AM  [facility] [place], [place], [address] Phone: [phone]   Fax:  [phone]

## 2014-05-17 ENCOUNTER — Encounter: Payer: Self-pay | Admitting: Physical Therapy

## 2014-05-17 ENCOUNTER — Ambulatory Visit: Payer: Medicare Other | Attending: Orthopedic Surgery | Admitting: Physical Therapy

## 2014-05-17 DIAGNOSIS — M25511 Pain in right shoulder: Secondary | ICD-10-CM | POA: Diagnosis not present

## 2014-05-17 DIAGNOSIS — M25611 Stiffness of right shoulder, not elsewhere classified: Secondary | ICD-10-CM

## 2014-05-17 NOTE — Therapy (Signed)
[facility] [place], [place], [address] Phone: [phone]   Fax:  [phone]  Physical Therapy Treatment  Patient Details  Name: Angela Wong MRN: [id number] Date of Birth: 10-04-1940 Referring Provider:  [name], FNP  Encounter Date: 05/17/2014      PT End of Session - 05/17/14 1352    Visit Number 14   Date for PT Re-Evaluation 05/25/14   PT Start Time 1314   PT Stop Time 1411   PT Time Calculation (min) 57 min   Activity Tolerance Patient tolerated treatment well   Behavior During Therapy [facility] for tasks assessed/performed      Past Medical History  Diagnosis Date  . A-fib   . Sjogren's syndrome   . Raynaud's disease   . RLS (restless legs syndrome)   . IBS (irritable bowel syndrome)   . MVP (mitral valve prolapse)   . Cerebral vasculitis   . Osteoarthritis   . Asthma   . Ovarian cancer     lymph node removal with hysterectomy  . COPD (chronic obstructive pulmonary disease)   . GERD (gastroesophageal reflux disease)   . Fibromyalgia     Past Surgical History  Procedure Laterality Date  . Appendectomy    . Abdominal hysterectomy    . Cataract extraction Left   . Breast surgery      Biopsy    There were no vitals filed for this visit.  Visit Diagnosis:  Shoulder stiffness, right  Right shoulder pain      Subjective Assessment - 05/17/14 1321    Subjective feeling less pain today   Currently in Pain? Yes   Pain Score 3    Pain Location Shoulder   Pain Orientation Right   Pain Descriptors / Indicators Sore   Pain Type Chronic pain   Pain Onset 1 to 4 weeks ago   Aggravating Factors  driving   Pain Relieving Factors rest            [facility] PT Assessment - 05/17/14 0001    AROM   Overall AROM  Deficits   AROM Assessment Site Shoulder   Right/Left Shoulder Right   Right Shoulder Flexion 125 Degrees                     [facility] Adult PT Treatment/Exercise -  05/17/14 0001    Shoulder Exercises: Prone   Other Prone Exercises kneeling for 2# rows/ ext/ horiz abd 3x10 each   Shoulder Exercises: Standing   Other Standing Exercises RW4 with yellow t-band 3x10 each   Other Standing Exercises wall slide with eccentic lowering 2 x fatigue   Shoulder Exercises: Pulleys   Flexion --  74min   Shoulder Exercises: ROM/Strengthening   UBE (Upper Arm Bike) 8 min 90RPM   Moist Heat Therapy   Number Minutes Moist Heat 15 Minutes   Moist Heat Location Shoulder   Electrical Stimulation   Electrical Stimulation Location Right shoulder   Electrical Stimulation Action 5/5   Electrical Stimulation Parameters premod   Electrical Stimulation Goals Pain                  PT Short Term Goals - 05/09/14 1031    PT SHORT TERM GOAL #1   Title Ind. with HP.   Time 6   Period Weeks   Status Achieved   PT SHORT TERM GOAL #2   Title Active right shoulder flexion to 145 degrees so the patient can  easily reach overhead.   Time 6   Period Weeks   Status On-going   PT SHORT TERM GOAL #3   Title Active ER to 70 degrees+ to allow for easily donning/doffing of apparel   Time 6   Period Weeks   Status Achieved                  Plan - 05/17/14 1354    Clinical Impression Statement patient tolerated tx well today. has improved with AROM today and is progressing toward goal.   Pt will benefit from skilled therapeutic intervention in order to improve on the following deficits Pain;Decreased strength   Rehab Potential Good   PT Frequency 2x / week   PT Duration 6 weeks   PT Treatment/Interventions Electrical Stimulation;Patient/family education;Therapeutic exercise;Passive range of motion;Ultrasound   PT Next Visit Plan cont with POC for 1 week   Consulted and Agree with Plan of Care Patient        Problem List Patient Active Problem List   Diagnosis Date Noted  . Rotator cuff tear 03/23/2014  . Osteoarthritis   . Asthma   . Ovarian  cancer   . COPD (chronic obstructive pulmonary disease)   . Fibromyalgia   . RLS (restless legs syndrome)   . Cerebral vasculitis   . Sjogren's syndrome   . Raynaud's disease   . MVP (mitral valve prolapse)   . IBS (irritable bowel syndrome)   . Congestive dilated cardiomyopathy 02/09/2014  . Dyspnea 12/13/2013  . Hyperlipidemia 11/26/2013  . Malaise and fatigue 11/26/2013  . Multiple drug allergies 11/02/2013  . History of ovarian cancer 10/01/2013  . GERD (gastroesophageal reflux disease) 10/01/2013  . Benign essential HTN 10/01/2013  . Atrial fibrillation 07/27/2013  . Long term (current) use of anticoagulants 07/27/2013    Mc Bloodworth P, PTA 05/17/2014, 2:11 PM  [facility] [address] [place], [place], [address] Phone: [phone]   Fax:  [phone]

## 2014-05-20 ENCOUNTER — Ambulatory Visit: Payer: Medicare Other | Admitting: Physical Therapy

## 2014-05-20 ENCOUNTER — Encounter: Payer: Self-pay | Admitting: Physical Therapy

## 2014-05-20 DIAGNOSIS — M25611 Stiffness of right shoulder, not elsewhere classified: Secondary | ICD-10-CM

## 2014-05-20 DIAGNOSIS — M25511 Pain in right shoulder: Secondary | ICD-10-CM | POA: Diagnosis not present

## 2014-05-20 NOTE — Therapy (Signed)
[facility] [place], [place], [address] Phone: [phone]   Fax:  [phone]  Physical Therapy Treatment  Patient Details  Name: Angela Wong MRN: [id number] Date of Birth: 11-12-1940 Referring Provider:  [name], FNP  Encounter Date: 05/20/2014      PT End of Session - 05/20/14 1004    Visit Number 15   Date for PT Re-Evaluation 05/25/14   PT Start Time 0944   PT Stop Time 1043   PT Time Calculation (min) 59 min   Activity Tolerance Patient tolerated treatment well   Behavior During Therapy [facility] for tasks assessed/performed      Past Medical History  Diagnosis Date  . A-fib   . Sjogren's syndrome   . Raynaud's disease   . RLS (restless legs syndrome)   . IBS (irritable bowel syndrome)   . MVP (mitral valve prolapse)   . Cerebral vasculitis   . Osteoarthritis   . Asthma   . Ovarian cancer     lymph node removal with hysterectomy  . COPD (chronic obstructive pulmonary disease)   . GERD (gastroesophageal reflux disease)   . Fibromyalgia     Past Surgical History  Procedure Laterality Date  . Appendectomy    . Abdominal hysterectomy    . Cataract extraction Left   . Breast surgery      Biopsy    There were no vitals filed for this visit.  Visit Diagnosis:  Shoulder stiffness, right  Right shoulder pain      Subjective Assessment - 05/20/14 0949    Subjective some soreness from the weather   Currently in Pain? Yes   Pain Score 3    Pain Location Shoulder   Pain Orientation Right   Pain Descriptors / Indicators Sore   Pain Type Chronic pain   Pain Onset 1 to 4 weeks ago   Pain Frequency Occasional   Aggravating Factors  shifting gears with driving   Pain Relieving Factors rest            [facility] PT Assessment - 05/20/14 0001    AROM   Overall AROM  Deficits   AROM Assessment Site Shoulder   Right/Left Shoulder Right   Right Shoulder Flexion 125 Degrees                      [facility] Adult PT Treatment/Exercise - 05/20/14 0001    Shoulder Exercises: Prone   Other Prone Exercises kneeling for 2# rows/ ext/ horiz abd 3x10 each   Shoulder Exercises: Standing   Other Standing Exercises RW4 with yellow t-band 3x10 each   Other Standing Exercises wall slide with eccentic lowering 2 x fatigue   Shoulder Exercises: Pulleys   Flexion --  73min   Shoulder Exercises: ROM/Strengthening   UBE (Upper Arm Bike) 8 min 90RPM   Moist Heat Therapy   Number Minutes Moist Heat 15 Minutes   Moist Heat Location Shoulder   Electrical Stimulation   Electrical Stimulation Location Right shoulder   Electrical Stimulation Action 5/5   Electrical Stimulation Parameters premod   Electrical Stimulation Goals Pain                  PT Short Term Goals - 05/09/14 1031    PT SHORT TERM GOAL #1   Title Ind. with HP.   Time 6   Period Weeks   Status Achieved   PT SHORT TERM GOAL #2   Title Active right  shoulder flexion to 145 degrees so the patient can easily reach overhead.   Time 6   Period Weeks   Status On-going   PT SHORT TERM GOAL #3   Title Active ER to 70 degrees+ to allow for easily donning/doffing of apparel   Time 6   Period Weeks   Status Achieved                  Plan - 05/20/14 1006    Clinical Impression Statement patient continues to progress with all strengthening activities which is improving active antigravity shoulder flexion. unable to meet the last goal today due to weakness.   Pt will benefit from skilled therapeutic intervention in order to improve on the following deficits Pain;Decreased strength   Rehab Potential Good   PT Frequency 2x / week   PT Duration 6 weeks   PT Treatment/Interventions Electrical Stimulation;Patient/family education;Therapeutic exercise;Passive range of motion;Ultrasound   PT Next Visit Plan cont with POC and DC next week   Consulted and Agree with Plan of Care Patient         Problem List Patient Active Problem List   Diagnosis Date Noted  . Rotator cuff tear 03/23/2014  . Osteoarthritis   . Asthma   . Ovarian cancer   . COPD (chronic obstructive pulmonary disease)   . Fibromyalgia   . RLS (restless legs syndrome)   . Cerebral vasculitis   . Sjogren's syndrome   . Raynaud's disease   . MVP (mitral valve prolapse)   . IBS (irritable bowel syndrome)   . Congestive dilated cardiomyopathy 02/09/2014  . Dyspnea 12/13/2013  . Hyperlipidemia 11/26/2013  . Malaise and fatigue 11/26/2013  . Multiple drug allergies 11/02/2013  . History of ovarian cancer 10/01/2013  . GERD (gastroesophageal reflux disease) 10/01/2013  . Benign essential HTN 10/01/2013  . Atrial fibrillation 07/27/2013  . Long term (current) use of anticoagulants 07/27/2013    Jaire Pinkham P, PTA 05/20/2014, 10:45 AM  [facility] [place], [place], [address] Phone: [phone]   Fax:  [phone]

## 2014-05-23 ENCOUNTER — Encounter: Payer: Self-pay | Admitting: Physical Therapy

## 2014-05-23 ENCOUNTER — Ambulatory Visit: Payer: Medicare Other | Admitting: Physical Therapy

## 2014-05-23 DIAGNOSIS — M25611 Stiffness of right shoulder, not elsewhere classified: Secondary | ICD-10-CM | POA: Diagnosis not present

## 2014-05-23 DIAGNOSIS — M25511 Pain in right shoulder: Secondary | ICD-10-CM

## 2014-05-23 NOTE — Therapy (Signed)
[facility] [place], [place], [address] Phone: [phone]   Fax:  [phone]  Physical Therapy Treatment  Patient Details  Name: Angela Wong MRN: [id number] Date of Birth: 11-17-1940 Referring Provider:  [name], FNP  Encounter Date: 05/23/2014      PT End of Session - 05/23/14 0927    Visit Number 16   Date for PT Re-Evaluation 05/25/14   PT Start Time 0910   PT Stop Time 1001   PT Time Calculation (min) 51 min   Activity Tolerance Patient tolerated treatment well   Behavior During Therapy [facility] for tasks assessed/performed      Past Medical History  Diagnosis Date  . A-fib   . Sjogren's syndrome   . Raynaud's disease   . RLS (restless legs syndrome)   . IBS (irritable bowel syndrome)   . MVP (mitral valve prolapse)   . Cerebral vasculitis   . Osteoarthritis   . Asthma   . Ovarian cancer     lymph node removal with hysterectomy  . COPD (chronic obstructive pulmonary disease)   . GERD (gastroesophageal reflux disease)   . Fibromyalgia     Past Surgical History  Procedure Laterality Date  . Appendectomy    . Abdominal hysterectomy    . Cataract extraction Left   . Breast surgery      Biopsy    There were no vitals filed for this visit.  Visit Diagnosis:  Shoulder stiffness, right  Right shoulder pain      Subjective Assessment - 05/23/14 0914    Subjective some soreness from ADL's   Currently in Pain? Yes   Pain Score 2    Pain Location Shoulder   Pain Orientation Right   Pain Descriptors / Indicators Sore   Pain Type Chronic pain   Pain Onset 1 to 4 weeks ago   Pain Frequency Intermittent   Aggravating Factors  shifting gears with driving   Pain Relieving Factors rest            [facility] PT Assessment - 05/23/14 0001    AROM   Overall AROM  Deficits   AROM Assessment Site Shoulder   Right/Left Shoulder Right   Right Shoulder Flexion 130 Degrees                      [facility] Adult PT Treatment/Exercise - 05/23/14 0001    Shoulder Exercises: Prone   Other Prone Exercises kneeling for 2# rows/ ext/ horiz abd 3x10 each   Shoulder Exercises: Standing   Other Standing Exercises RW4 with yellow t-band 3x10 each   Other Standing Exercises wall slide with eccentic lowering 2 x fatigue   Shoulder Exercises: Pulleys   Flexion --  58mn   Shoulder Exercises: ROM/Strengthening   UBE (Upper Arm Bike) 8 min 90RPM   Moist Heat Therapy   Number Minutes Moist Heat 10 Minutes   Moist Heat Location Shoulder   Electrical Stimulation   Electrical Stimulation Location Right shoulder   Electrical Stimulation Action 5/5 x133m   Electrical Stimulation Parameters premod   Electrical Stimulation Goals Pain                  PT Short Term Goals - 05/23/14 0931    PT SHORT TERM GOAL #1   Title Ind. with HP.   Time 6   Period Weeks   Status Achieved   PT SHORT TERM GOAL #2   Title Active right  shoulder flexion to 145 degrees so the patient can easily reach overhead.   Time 6   Period Weeks   Status Not Met  130 degrees antigravity (full passive)   PT SHORT TERM GOAL #3   Title Active ER to 70 degrees+ to allow for easily donning/doffing of apparel   Time 6   Period Weeks   Status Achieved                  Plan - 03-Jun-2014 0932    Clinical Impression Statement patient has little soreness and is independent with all ADL's, only difficulty is with shifting the gear with driving. patient has met all goals except active ROM due to weakness.   Pt will benefit from skilled therapeutic intervention in order to improve on the following deficits Pain;Decreased strength   Rehab Potential Good   PT Frequency 2x / week   PT Duration 6 weeks   PT Treatment/Interventions Electrical Stimulation;Patient/family education;Therapeutic exercise;Passive range of motion;Ultrasound   PT Next Visit Plan DC to HEP per patient with MPT  approval   Consulted and Agree with Plan of Care Patient          G-Codes - Jun 03, 2014 1214    Functional Assessment Tool Used FOTO   Functional Limitation Mobility: Walking and moving around   Mobility: Walking and Moving Around Current Status [phone]) At least 20 percent but less than 40 percent impaired, limited or restricted   Mobility: Walking and Moving Around Goal Status [phone]) At least 20 percent but less than 40 percent impaired, limited or restricted   Mobility: Walking and Moving Around Discharge Status [phone]) At least 20 percent but less than 40 percent impaired, limited or restricted      Problem List Patient Active Problem List   Diagnosis Date Noted  . Rotator cuff tear 03/23/2014  . Osteoarthritis   . Asthma   . Ovarian cancer   . COPD (chronic obstructive pulmonary disease)   . Fibromyalgia   . RLS (restless legs syndrome)   . Cerebral vasculitis   . Sjogren's syndrome   . Raynaud's disease   . MVP (mitral valve prolapse)   . IBS (irritable bowel syndrome)   . Congestive dilated cardiomyopathy 02/09/2014  . Dyspnea 12/13/2013  . Hyperlipidemia 11/26/2013  . Malaise and fatigue 11/26/2013  . Multiple drug allergies 11/02/2013  . History of ovarian cancer 10/01/2013  . GERD (gastroesophageal reflux disease) 10/01/2013  . Benign essential HTN 10/01/2013  . Atrial fibrillation 07/27/2013  . Long term (current) use of anticoagulants 07/27/2013   PHYSICAL THERAPY DISCHARGE SUMMARY  Visits from Start of Care: 16  Current functional level related to goals / functional outcomes: Please see above.   Remaining deficits: Some loss of antigravity shoulder flexion.   Education / Equipment: HEP. Plan: Patient agrees to discharge.  Patient goals were partially met. Patient is being discharged due to being pleased with the current functional level.  ?????     Daimen Shovlin, [place] MPT Jun 03, 2014, 12:14 PM  [facility] [address] [place], [place], [address] Phone: [phone]   Fax:  [phone]

## 2014-05-23 NOTE — Therapy (Signed)
[facility] [place], [place], [address] Phone: [phone]   Fax:  [phone]  Physical Therapy Treatment  Patient Details  Name: Angela Wong MRN: [id number] Date of Birth: March 25, 1940 Referring Provider:  [name], FNP  Encounter Date: 05/23/2014      PT End of Session - 05/23/14 0927    Visit Number 16   Date for PT Re-Evaluation 05/25/14   PT Start Time 0910   PT Stop Time 1001   PT Time Calculation (min) 51 min   Activity Tolerance Patient tolerated treatment well   Behavior During Therapy [facility] for tasks assessed/performed      Past Medical History  Diagnosis Date  . A-fib   . Sjogren's syndrome   . Raynaud's disease   . RLS (restless legs syndrome)   . IBS (irritable bowel syndrome)   . MVP (mitral valve prolapse)   . Cerebral vasculitis   . Osteoarthritis   . Asthma   . Ovarian cancer     lymph node removal with hysterectomy  . COPD (chronic obstructive pulmonary disease)   . GERD (gastroesophageal reflux disease)   . Fibromyalgia     Past Surgical History  Procedure Laterality Date  . Appendectomy    . Abdominal hysterectomy    . Cataract extraction Left   . Breast surgery      Biopsy    There were no vitals filed for this visit.  Visit Diagnosis:  Shoulder stiffness, right  Right shoulder pain      Subjective Assessment - 05/23/14 0914    Subjective some soreness from ADL's   Currently in Pain? Yes   Pain Score 2    Pain Location Shoulder   Pain Orientation Right   Pain Descriptors / Indicators Sore   Pain Type Chronic pain   Pain Onset 1 to 4 weeks ago   Pain Frequency Intermittent   Aggravating Factors  shifting gears with driving   Pain Relieving Factors rest            [facility] PT Assessment - 05/23/14 0001    AROM   Overall AROM  Deficits   AROM Assessment Site Shoulder   Right/Left Shoulder Right   Right Shoulder Flexion 130 Degrees                      [facility] Adult PT Treatment/Exercise - 05/23/14 0001    Shoulder Exercises: Prone   Other Prone Exercises kneeling for 2# rows/ ext/ horiz abd 3x10 each   Shoulder Exercises: Standing   Other Standing Exercises RW4 with yellow t-band 3x10 each   Other Standing Exercises wall slide with eccentic lowering 2 x fatigue   Shoulder Exercises: Pulleys   Flexion --  15mn   Shoulder Exercises: ROM/Strengthening   UBE (Upper Arm Bike) 8 min 90RPM   Moist Heat Therapy   Number Minutes Moist Heat 10 Minutes   Moist Heat Location Shoulder   Electrical Stimulation   Electrical Stimulation Location Right shoulder   Electrical Stimulation Action 5/5 x170m   Electrical Stimulation Parameters premod   Electrical Stimulation Goals Pain                  PT Short Term Goals - 05/23/14 0931    PT SHORT TERM GOAL #1   Title Ind. with HP.   Time 6   Period Weeks   Status Achieved   PT SHORT TERM GOAL #2   Title Active right  shoulder flexion to 145 degrees so the patient can easily reach overhead.   Time 6   Period Weeks   Status Not Met  130 degrees antigravity (full passive)   PT SHORT TERM GOAL #3   Title Active ER to 70 degrees+ to allow for easily donning/doffing of apparel   Time 6   Period Weeks   Status Achieved                  Plan - 05/23/14 0932    Clinical Impression Statement patient has little soreness and is independent with all ADL's, only difficulty is with shifting the gear with driving. patient has met all goals except active ROM due to weakness.   Pt will benefit from skilled therapeutic intervention in order to improve on the following deficits Pain;Decreased strength   Rehab Potential Good   PT Frequency 2x / week   PT Duration 6 weeks   PT Treatment/Interventions Electrical Stimulation;Patient/family education;Therapeutic exercise;Passive range of motion;Ultrasound   PT Next Visit Plan DC to HEP per patient with MPT  approval   Consulted and Agree with Plan of Care Patient        Problem List Patient Active Problem List   Diagnosis Date Noted  . Rotator cuff tear 03/23/2014  . Osteoarthritis   . Asthma   . Ovarian cancer   . COPD (chronic obstructive pulmonary disease)   . Fibromyalgia   . RLS (restless legs syndrome)   . Cerebral vasculitis   . Sjogren's syndrome   . Raynaud's disease   . MVP (mitral valve prolapse)   . IBS (irritable bowel syndrome)   . Congestive dilated cardiomyopathy 02/09/2014  . Dyspnea 12/13/2013  . Hyperlipidemia 11/26/2013  . Malaise and fatigue 11/26/2013  . Multiple drug allergies 11/02/2013  . History of ovarian cancer 10/01/2013  . GERD (gastroesophageal reflux disease) 10/01/2013  . Benign essential HTN 10/01/2013  . Atrial fibrillation 07/27/2013  . Long term (current) use of anticoagulants 07/27/2013   Ladean Raya, PTA 05/23/2014 10:02 AM Keli Buehner, Venetia Maxon 05/23/2014, 10:02 AM  [facility] [address] [place], [place], [address] Phone: [phone]   Fax:  [phone]

## 2014-05-31 DIAGNOSIS — E780 Pure hypercholesterolemia: Secondary | ICD-10-CM | POA: Diagnosis not present

## 2014-05-31 DIAGNOSIS — R7989 Other specified abnormal findings of blood chemistry: Secondary | ICD-10-CM | POA: Diagnosis not present

## 2014-05-31 DIAGNOSIS — I10 Essential (primary) hypertension: Secondary | ICD-10-CM | POA: Diagnosis not present

## 2014-05-31 DIAGNOSIS — R109 Unspecified abdominal pain: Secondary | ICD-10-CM | POA: Diagnosis not present

## 2014-05-31 DIAGNOSIS — M3500 Sicca syndrome, unspecified: Secondary | ICD-10-CM | POA: Diagnosis not present

## 2014-05-31 DIAGNOSIS — E559 Vitamin D deficiency, unspecified: Secondary | ICD-10-CM | POA: Diagnosis not present

## 2014-06-01 DIAGNOSIS — E780 Pure hypercholesterolemia: Secondary | ICD-10-CM | POA: Diagnosis not present

## 2014-06-01 DIAGNOSIS — R109 Unspecified abdominal pain: Secondary | ICD-10-CM | POA: Diagnosis not present

## 2014-06-01 DIAGNOSIS — R7989 Other specified abnormal findings of blood chemistry: Secondary | ICD-10-CM | POA: Diagnosis not present

## 2014-06-01 DIAGNOSIS — I10 Essential (primary) hypertension: Secondary | ICD-10-CM | POA: Diagnosis not present

## 2014-06-01 DIAGNOSIS — M3500 Sicca syndrome, unspecified: Secondary | ICD-10-CM | POA: Diagnosis not present

## 2014-06-10 DIAGNOSIS — M85852 Other specified disorders of bone density and structure, left thigh: Secondary | ICD-10-CM | POA: Diagnosis not present

## 2014-06-10 DIAGNOSIS — R1012 Left upper quadrant pain: Secondary | ICD-10-CM | POA: Diagnosis not present

## 2014-06-16 DIAGNOSIS — H1132 Conjunctival hemorrhage, left eye: Secondary | ICD-10-CM | POA: Diagnosis not present

## 2014-06-16 DIAGNOSIS — R531 Weakness: Secondary | ICD-10-CM | POA: Diagnosis not present

## 2014-06-16 DIAGNOSIS — E876 Hypokalemia: Secondary | ICD-10-CM | POA: Diagnosis not present

## 2014-06-16 DIAGNOSIS — D500 Iron deficiency anemia secondary to blood loss (chronic): Secondary | ICD-10-CM | POA: Diagnosis not present

## 2014-06-16 DIAGNOSIS — R42 Dizziness and giddiness: Secondary | ICD-10-CM | POA: Diagnosis not present

## 2014-06-17 ENCOUNTER — Ambulatory Visit: Payer: Self-pay

## 2014-06-20 ENCOUNTER — Telehealth: Payer: Self-pay | Admitting: Pharmacist

## 2014-06-20 NOTE — Telephone Encounter (Signed)
Patient was due to have protime checked.  Tried to call patient's home but number disconnected.  Called her son.  He reported that she is in [place] for the summer.  She is having check by per PCP there.  She will return in the Fall and will begin protime checks here then.

## 2014-06-22 DIAGNOSIS — Z803 Family history of malignant neoplasm of breast: Secondary | ICD-10-CM | POA: Diagnosis not present

## 2014-06-22 DIAGNOSIS — R928 Other abnormal and inconclusive findings on diagnostic imaging of breast: Secondary | ICD-10-CM | POA: Diagnosis not present

## 2014-06-23 DIAGNOSIS — I10 Essential (primary) hypertension: Secondary | ICD-10-CM | POA: Diagnosis not present

## 2014-06-23 DIAGNOSIS — Z7901 Long term (current) use of anticoagulants: Secondary | ICD-10-CM | POA: Diagnosis not present

## 2014-06-23 DIAGNOSIS — I4891 Unspecified atrial fibrillation: Secondary | ICD-10-CM | POA: Diagnosis not present

## 2014-06-27 DIAGNOSIS — Z79899 Other long term (current) drug therapy: Secondary | ICD-10-CM | POA: Diagnosis not present

## 2014-06-27 DIAGNOSIS — M3501 Sicca syndrome with keratoconjunctivitis: Secondary | ICD-10-CM | POA: Diagnosis not present

## 2014-06-27 DIAGNOSIS — H47091 Other disorders of optic nerve, not elsewhere classified, right eye: Secondary | ICD-10-CM | POA: Diagnosis not present

## 2014-06-27 DIAGNOSIS — H04123 Dry eye syndrome of bilateral lacrimal glands: Secondary | ICD-10-CM | POA: Diagnosis not present

## 2014-06-27 DIAGNOSIS — H5347 Heteronymous bilateral field defects: Secondary | ICD-10-CM | POA: Diagnosis not present

## 2014-06-27 DIAGNOSIS — S065X0A Traumatic subdural hemorrhage without loss of consciousness, initial encounter: Secondary | ICD-10-CM | POA: Diagnosis not present

## 2014-06-28 DIAGNOSIS — R931 Abnormal findings on diagnostic imaging of heart and coronary circulation: Secondary | ICD-10-CM | POA: Diagnosis not present

## 2014-06-28 DIAGNOSIS — I10 Essential (primary) hypertension: Secondary | ICD-10-CM | POA: Diagnosis not present

## 2014-06-28 DIAGNOSIS — I4891 Unspecified atrial fibrillation: Secondary | ICD-10-CM | POA: Diagnosis not present

## 2014-06-28 DIAGNOSIS — R079 Chest pain, unspecified: Secondary | ICD-10-CM | POA: Diagnosis not present

## 2014-07-05 DIAGNOSIS — F4320 Adjustment disorder, unspecified: Secondary | ICD-10-CM | POA: Diagnosis not present

## 2014-07-15 DIAGNOSIS — I4891 Unspecified atrial fibrillation: Secondary | ICD-10-CM | POA: Diagnosis not present

## 2014-07-19 DIAGNOSIS — F4320 Adjustment disorder, unspecified: Secondary | ICD-10-CM | POA: Diagnosis not present

## 2014-07-21 DIAGNOSIS — R42 Dizziness and giddiness: Secondary | ICD-10-CM | POA: Diagnosis not present

## 2014-07-21 DIAGNOSIS — H04123 Dry eye syndrome of bilateral lacrimal glands: Secondary | ICD-10-CM | POA: Diagnosis not present

## 2014-07-21 DIAGNOSIS — Z961 Presence of intraocular lens: Secondary | ICD-10-CM | POA: Diagnosis not present

## 2014-07-21 DIAGNOSIS — R6889 Other general symptoms and signs: Secondary | ICD-10-CM | POA: Diagnosis not present

## 2014-07-21 DIAGNOSIS — R5383 Other fatigue: Secondary | ICD-10-CM | POA: Diagnosis not present

## 2014-07-23 DIAGNOSIS — L821 Other seborrheic keratosis: Secondary | ICD-10-CM | POA: Diagnosis not present

## 2014-07-25 DIAGNOSIS — R410 Disorientation, unspecified: Secondary | ICD-10-CM | POA: Diagnosis not present

## 2014-07-25 DIAGNOSIS — R51 Headache: Secondary | ICD-10-CM | POA: Diagnosis not present

## 2014-07-29 DIAGNOSIS — R51 Headache: Secondary | ICD-10-CM | POA: Diagnosis not present

## 2014-07-29 DIAGNOSIS — R109 Unspecified abdominal pain: Secondary | ICD-10-CM | POA: Diagnosis not present

## 2014-07-29 DIAGNOSIS — R1110 Vomiting, unspecified: Secondary | ICD-10-CM | POA: Diagnosis not present

## 2014-08-01 DIAGNOSIS — R197 Diarrhea, unspecified: Secondary | ICD-10-CM | POA: Diagnosis not present

## 2014-08-01 DIAGNOSIS — K5730 Diverticulosis of large intestine without perforation or abscess without bleeding: Secondary | ICD-10-CM | POA: Diagnosis not present

## 2014-08-01 DIAGNOSIS — I10 Essential (primary) hypertension: Secondary | ICD-10-CM | POA: Diagnosis not present

## 2014-08-01 DIAGNOSIS — K219 Gastro-esophageal reflux disease without esophagitis: Secondary | ICD-10-CM | POA: Diagnosis not present

## 2014-08-02 DIAGNOSIS — I10 Essential (primary) hypertension: Secondary | ICD-10-CM | POA: Diagnosis not present

## 2014-08-02 DIAGNOSIS — R079 Chest pain, unspecified: Secondary | ICD-10-CM | POA: Diagnosis not present

## 2014-08-02 DIAGNOSIS — I4891 Unspecified atrial fibrillation: Secondary | ICD-10-CM | POA: Diagnosis not present

## 2014-08-02 DIAGNOSIS — I482 Chronic atrial fibrillation: Secondary | ICD-10-CM | POA: Diagnosis not present

## 2014-08-02 DIAGNOSIS — Z5181 Encounter for therapeutic drug level monitoring: Secondary | ICD-10-CM | POA: Diagnosis not present

## 2014-08-02 DIAGNOSIS — I429 Cardiomyopathy, unspecified: Secondary | ICD-10-CM | POA: Diagnosis not present

## 2014-08-02 DIAGNOSIS — Z7901 Long term (current) use of anticoagulants: Secondary | ICD-10-CM | POA: Diagnosis not present

## 2014-08-08 DIAGNOSIS — H1045 Other chronic allergic conjunctivitis: Secondary | ICD-10-CM | POA: Diagnosis not present

## 2014-08-08 DIAGNOSIS — Z961 Presence of intraocular lens: Secondary | ICD-10-CM | POA: Diagnosis not present

## 2014-08-08 DIAGNOSIS — H04123 Dry eye syndrome of bilateral lacrimal glands: Secondary | ICD-10-CM | POA: Diagnosis not present

## 2014-08-08 DIAGNOSIS — I4891 Unspecified atrial fibrillation: Secondary | ICD-10-CM | POA: Diagnosis not present

## 2014-08-08 DIAGNOSIS — I4892 Unspecified atrial flutter: Secondary | ICD-10-CM | POA: Diagnosis not present

## 2014-08-09 DIAGNOSIS — F4320 Adjustment disorder, unspecified: Secondary | ICD-10-CM | POA: Diagnosis not present

## 2014-08-11 DIAGNOSIS — I4891 Unspecified atrial fibrillation: Secondary | ICD-10-CM | POA: Diagnosis not present

## 2014-08-15 DIAGNOSIS — F4320 Adjustment disorder, unspecified: Secondary | ICD-10-CM | POA: Diagnosis not present

## 2014-08-18 DIAGNOSIS — I4891 Unspecified atrial fibrillation: Secondary | ICD-10-CM | POA: Diagnosis not present

## 2014-08-25 DIAGNOSIS — I4891 Unspecified atrial fibrillation: Secondary | ICD-10-CM | POA: Diagnosis not present

## 2014-08-26 DIAGNOSIS — E878 Other disorders of electrolyte and fluid balance, not elsewhere classified: Secondary | ICD-10-CM | POA: Diagnosis not present

## 2014-08-26 DIAGNOSIS — D649 Anemia, unspecified: Secondary | ICD-10-CM | POA: Diagnosis not present

## 2014-08-30 DIAGNOSIS — F4320 Adjustment disorder, unspecified: Secondary | ICD-10-CM | POA: Diagnosis not present

## 2014-09-05 DIAGNOSIS — I429 Cardiomyopathy, unspecified: Secondary | ICD-10-CM | POA: Diagnosis not present

## 2014-09-05 DIAGNOSIS — I10 Essential (primary) hypertension: Secondary | ICD-10-CM | POA: Diagnosis not present

## 2014-09-05 DIAGNOSIS — J410 Simple chronic bronchitis: Secondary | ICD-10-CM | POA: Diagnosis not present

## 2014-09-05 DIAGNOSIS — K210 Gastro-esophageal reflux disease with esophagitis: Secondary | ICD-10-CM | POA: Diagnosis not present

## 2014-09-06 DIAGNOSIS — F4320 Adjustment disorder, unspecified: Secondary | ICD-10-CM | POA: Diagnosis not present

## 2014-09-08 DIAGNOSIS — I4891 Unspecified atrial fibrillation: Secondary | ICD-10-CM | POA: Diagnosis not present

## 2014-09-12 DIAGNOSIS — F4320 Adjustment disorder, unspecified: Secondary | ICD-10-CM | POA: Diagnosis not present

## 2014-09-15 DIAGNOSIS — I495 Sick sinus syndrome: Secondary | ICD-10-CM | POA: Diagnosis not present

## 2014-09-15 DIAGNOSIS — I4891 Unspecified atrial fibrillation: Secondary | ICD-10-CM | POA: Diagnosis not present

## 2014-09-15 DIAGNOSIS — E785 Hyperlipidemia, unspecified: Secondary | ICD-10-CM | POA: Diagnosis not present

## 2014-09-15 DIAGNOSIS — R918 Other nonspecific abnormal finding of lung field: Secondary | ICD-10-CM | POA: Diagnosis not present

## 2014-09-15 DIAGNOSIS — R05 Cough: Secondary | ICD-10-CM | POA: Diagnosis not present

## 2014-09-15 DIAGNOSIS — F42 Obsessive-compulsive disorder: Secondary | ICD-10-CM | POA: Diagnosis not present

## 2014-09-15 DIAGNOSIS — D649 Anemia, unspecified: Secondary | ICD-10-CM | POA: Diagnosis not present

## 2014-09-15 DIAGNOSIS — F329 Major depressive disorder, single episode, unspecified: Secondary | ICD-10-CM | POA: Diagnosis not present

## 2014-09-15 DIAGNOSIS — G479 Sleep disorder, unspecified: Secondary | ICD-10-CM | POA: Diagnosis not present

## 2014-09-15 DIAGNOSIS — M3500 Sicca syndrome, unspecified: Secondary | ICD-10-CM | POA: Diagnosis not present

## 2014-09-15 DIAGNOSIS — K219 Gastro-esophageal reflux disease without esophagitis: Secondary | ICD-10-CM | POA: Diagnosis not present

## 2014-09-15 DIAGNOSIS — F419 Anxiety disorder, unspecified: Secondary | ICD-10-CM | POA: Diagnosis not present

## 2014-09-15 DIAGNOSIS — R42 Dizziness and giddiness: Secondary | ICD-10-CM | POA: Diagnosis not present

## 2014-09-15 DIAGNOSIS — M1990 Unspecified osteoarthritis, unspecified site: Secondary | ICD-10-CM | POA: Diagnosis not present

## 2014-09-15 DIAGNOSIS — I10 Essential (primary) hypertension: Secondary | ICD-10-CM | POA: Diagnosis not present

## 2014-09-15 DIAGNOSIS — E039 Hypothyroidism, unspecified: Secondary | ICD-10-CM | POA: Diagnosis not present

## 2014-09-15 DIAGNOSIS — R531 Weakness: Secondary | ICD-10-CM | POA: Diagnosis not present

## 2014-09-15 DIAGNOSIS — J45909 Unspecified asthma, uncomplicated: Secondary | ICD-10-CM | POA: Diagnosis not present

## 2014-09-15 DIAGNOSIS — R200 Anesthesia of skin: Secondary | ICD-10-CM | POA: Diagnosis not present

## 2014-09-15 DIAGNOSIS — R000 Tachycardia, unspecified: Secondary | ICD-10-CM | POA: Diagnosis not present

## 2014-09-15 DIAGNOSIS — I341 Nonrheumatic mitral (valve) prolapse: Secondary | ICD-10-CM | POA: Diagnosis not present

## 2014-09-15 DIAGNOSIS — I482 Chronic atrial fibrillation: Secondary | ICD-10-CM | POA: Diagnosis not present

## 2014-09-15 DIAGNOSIS — I429 Cardiomyopathy, unspecified: Secondary | ICD-10-CM | POA: Diagnosis not present

## 2014-09-15 DIAGNOSIS — R197 Diarrhea, unspecified: Secondary | ICD-10-CM | POA: Diagnosis not present

## 2014-09-15 DIAGNOSIS — J449 Chronic obstructive pulmonary disease, unspecified: Secondary | ICD-10-CM | POA: Diagnosis not present

## 2014-09-15 DIAGNOSIS — G629 Polyneuropathy, unspecified: Secondary | ICD-10-CM | POA: Diagnosis not present

## 2014-09-15 DIAGNOSIS — K5730 Diverticulosis of large intestine without perforation or abscess without bleeding: Secondary | ICD-10-CM | POA: Diagnosis not present

## 2014-09-15 DIAGNOSIS — R1310 Dysphagia, unspecified: Secondary | ICD-10-CM | POA: Diagnosis not present

## 2014-09-15 HISTORY — PX: PACEMAKER INSERTION: SHX728

## 2014-09-20 DIAGNOSIS — G4701 Insomnia due to medical condition: Secondary | ICD-10-CM | POA: Diagnosis not present

## 2014-09-20 DIAGNOSIS — R9431 Abnormal electrocardiogram [ECG] [EKG]: Secondary | ICD-10-CM | POA: Diagnosis not present

## 2014-09-20 DIAGNOSIS — I4892 Unspecified atrial flutter: Secondary | ICD-10-CM | POA: Diagnosis not present

## 2014-09-20 DIAGNOSIS — F430 Acute stress reaction: Secondary | ICD-10-CM | POA: Diagnosis not present

## 2014-09-20 DIAGNOSIS — R079 Chest pain, unspecified: Secondary | ICD-10-CM | POA: Diagnosis not present

## 2014-09-20 DIAGNOSIS — I429 Cardiomyopathy, unspecified: Secondary | ICD-10-CM | POA: Diagnosis not present

## 2014-09-20 DIAGNOSIS — F419 Anxiety disorder, unspecified: Secondary | ICD-10-CM | POA: Diagnosis not present

## 2014-09-20 DIAGNOSIS — M542 Cervicalgia: Secondary | ICD-10-CM | POA: Diagnosis not present

## 2014-09-22 DIAGNOSIS — J45909 Unspecified asthma, uncomplicated: Secondary | ICD-10-CM | POA: Diagnosis not present

## 2014-09-22 DIAGNOSIS — D649 Anemia, unspecified: Secondary | ICD-10-CM | POA: Diagnosis not present

## 2014-09-22 DIAGNOSIS — Z87440 Personal history of urinary (tract) infections: Secondary | ICD-10-CM | POA: Diagnosis not present

## 2014-09-22 DIAGNOSIS — Z48812 Encounter for surgical aftercare following surgery on the circulatory system: Secondary | ICD-10-CM | POA: Diagnosis not present

## 2014-09-22 DIAGNOSIS — Z602 Problems related to living alone: Secondary | ICD-10-CM | POA: Diagnosis not present

## 2014-09-22 DIAGNOSIS — M3500 Sicca syndrome, unspecified: Secondary | ICD-10-CM | POA: Diagnosis not present

## 2014-09-22 DIAGNOSIS — G4700 Insomnia, unspecified: Secondary | ICD-10-CM | POA: Diagnosis not present

## 2014-09-22 DIAGNOSIS — M810 Age-related osteoporosis without current pathological fracture: Secondary | ICD-10-CM | POA: Diagnosis not present

## 2014-09-22 DIAGNOSIS — F329 Major depressive disorder, single episode, unspecified: Secondary | ICD-10-CM | POA: Diagnosis not present

## 2014-09-22 DIAGNOSIS — Z8619 Personal history of other infectious and parasitic diseases: Secondary | ICD-10-CM | POA: Diagnosis not present

## 2014-09-22 DIAGNOSIS — G43909 Migraine, unspecified, not intractable, without status migrainosus: Secondary | ICD-10-CM | POA: Diagnosis not present

## 2014-09-22 DIAGNOSIS — M542 Cervicalgia: Secondary | ICD-10-CM | POA: Diagnosis not present

## 2014-09-22 DIAGNOSIS — M797 Fibromyalgia: Secondary | ICD-10-CM | POA: Diagnosis not present

## 2014-09-22 DIAGNOSIS — F42 Obsessive-compulsive disorder: Secondary | ICD-10-CM | POA: Diagnosis not present

## 2014-09-22 DIAGNOSIS — I10 Essential (primary) hypertension: Secondary | ICD-10-CM | POA: Diagnosis not present

## 2014-09-22 DIAGNOSIS — K219 Gastro-esophageal reflux disease without esophagitis: Secondary | ICD-10-CM | POA: Diagnosis not present

## 2014-09-22 DIAGNOSIS — Z950 Presence of cardiac pacemaker: Secondary | ICD-10-CM | POA: Diagnosis not present

## 2014-09-22 DIAGNOSIS — I4891 Unspecified atrial fibrillation: Secondary | ICD-10-CM | POA: Diagnosis not present

## 2014-09-26 DIAGNOSIS — F329 Major depressive disorder, single episode, unspecified: Secondary | ICD-10-CM | POA: Diagnosis not present

## 2014-09-26 DIAGNOSIS — M797 Fibromyalgia: Secondary | ICD-10-CM | POA: Diagnosis not present

## 2014-09-26 DIAGNOSIS — J45909 Unspecified asthma, uncomplicated: Secondary | ICD-10-CM | POA: Diagnosis not present

## 2014-09-26 DIAGNOSIS — Z48812 Encounter for surgical aftercare following surgery on the circulatory system: Secondary | ICD-10-CM | POA: Diagnosis not present

## 2014-09-26 DIAGNOSIS — I10 Essential (primary) hypertension: Secondary | ICD-10-CM | POA: Diagnosis not present

## 2014-09-26 DIAGNOSIS — I4891 Unspecified atrial fibrillation: Secondary | ICD-10-CM | POA: Diagnosis not present

## 2014-09-27 DIAGNOSIS — F4320 Adjustment disorder, unspecified: Secondary | ICD-10-CM | POA: Diagnosis not present

## 2014-09-28 DIAGNOSIS — I429 Cardiomyopathy, unspecified: Secondary | ICD-10-CM | POA: Diagnosis not present

## 2014-09-28 DIAGNOSIS — I4891 Unspecified atrial fibrillation: Secondary | ICD-10-CM | POA: Diagnosis not present

## 2014-09-28 DIAGNOSIS — Z950 Presence of cardiac pacemaker: Secondary | ICD-10-CM | POA: Diagnosis not present

## 2014-09-29 DIAGNOSIS — J45909 Unspecified asthma, uncomplicated: Secondary | ICD-10-CM | POA: Diagnosis not present

## 2014-09-29 DIAGNOSIS — Z48812 Encounter for surgical aftercare following surgery on the circulatory system: Secondary | ICD-10-CM | POA: Diagnosis not present

## 2014-09-29 DIAGNOSIS — F329 Major depressive disorder, single episode, unspecified: Secondary | ICD-10-CM | POA: Diagnosis not present

## 2014-09-29 DIAGNOSIS — I10 Essential (primary) hypertension: Secondary | ICD-10-CM | POA: Diagnosis not present

## 2014-09-29 DIAGNOSIS — I4891 Unspecified atrial fibrillation: Secondary | ICD-10-CM | POA: Diagnosis not present

## 2014-09-29 DIAGNOSIS — M797 Fibromyalgia: Secondary | ICD-10-CM | POA: Diagnosis not present

## 2014-10-10 DIAGNOSIS — H04123 Dry eye syndrome of bilateral lacrimal glands: Secondary | ICD-10-CM | POA: Diagnosis not present

## 2014-10-11 DIAGNOSIS — F4320 Adjustment disorder, unspecified: Secondary | ICD-10-CM | POA: Diagnosis not present

## 2014-10-11 DIAGNOSIS — I10 Essential (primary) hypertension: Secondary | ICD-10-CM | POA: Diagnosis not present

## 2014-10-11 DIAGNOSIS — J411 Mucopurulent chronic bronchitis: Secondary | ICD-10-CM | POA: Diagnosis not present

## 2014-10-13 DIAGNOSIS — M797 Fibromyalgia: Secondary | ICD-10-CM | POA: Diagnosis not present

## 2014-10-13 DIAGNOSIS — I10 Essential (primary) hypertension: Secondary | ICD-10-CM | POA: Diagnosis not present

## 2014-10-13 DIAGNOSIS — R5383 Other fatigue: Secondary | ICD-10-CM | POA: Diagnosis not present

## 2014-10-13 DIAGNOSIS — I4892 Unspecified atrial flutter: Secondary | ICD-10-CM | POA: Diagnosis not present

## 2014-10-13 DIAGNOSIS — F439 Reaction to severe stress, unspecified: Secondary | ICD-10-CM | POA: Diagnosis not present

## 2014-10-13 DIAGNOSIS — I4891 Unspecified atrial fibrillation: Secondary | ICD-10-CM | POA: Diagnosis not present

## 2014-10-13 DIAGNOSIS — F339 Major depressive disorder, recurrent, unspecified: Secondary | ICD-10-CM | POA: Diagnosis not present

## 2014-10-13 DIAGNOSIS — N952 Postmenopausal atrophic vaginitis: Secondary | ICD-10-CM | POA: Diagnosis not present

## 2014-10-17 DIAGNOSIS — F4320 Adjustment disorder, unspecified: Secondary | ICD-10-CM | POA: Diagnosis not present

## 2014-10-26 DIAGNOSIS — I4891 Unspecified atrial fibrillation: Secondary | ICD-10-CM | POA: Diagnosis not present

## 2014-10-27 ENCOUNTER — Telehealth: Payer: Self-pay | Admitting: Pharmacist

## 2014-10-27 DIAGNOSIS — I10 Essential (primary) hypertension: Secondary | ICD-10-CM | POA: Diagnosis not present

## 2014-10-27 DIAGNOSIS — I255 Ischemic cardiomyopathy: Secondary | ICD-10-CM | POA: Diagnosis not present

## 2014-10-27 DIAGNOSIS — I482 Chronic atrial fibrillation: Secondary | ICD-10-CM | POA: Diagnosis not present

## 2014-10-27 NOTE — Telephone Encounter (Signed)
Patient due INR - She has been in [place] over the summer and I was excepting her to return in the Fall.  I spoke with her son and he states that she had a pacemaker placed and that she needed to stay longer.  She is expected to return to [place] in December.

## 2014-10-28 DIAGNOSIS — R5383 Other fatigue: Secondary | ICD-10-CM | POA: Diagnosis not present

## 2014-10-28 DIAGNOSIS — N952 Postmenopausal atrophic vaginitis: Secondary | ICD-10-CM | POA: Diagnosis not present

## 2014-10-28 DIAGNOSIS — Z23 Encounter for immunization: Secondary | ICD-10-CM | POA: Diagnosis not present

## 2014-10-28 DIAGNOSIS — M791 Myalgia: Secondary | ICD-10-CM | POA: Diagnosis not present

## 2014-10-28 DIAGNOSIS — I4891 Unspecified atrial fibrillation: Secondary | ICD-10-CM | POA: Diagnosis not present

## 2014-10-28 DIAGNOSIS — I509 Heart failure, unspecified: Secondary | ICD-10-CM | POA: Diagnosis not present

## 2014-10-28 DIAGNOSIS — M129 Arthropathy, unspecified: Secondary | ICD-10-CM | POA: Diagnosis not present

## 2014-10-28 DIAGNOSIS — N390 Urinary tract infection, site not specified: Secondary | ICD-10-CM | POA: Diagnosis not present

## 2014-10-28 DIAGNOSIS — F339 Major depressive disorder, recurrent, unspecified: Secondary | ICD-10-CM | POA: Diagnosis not present

## 2014-11-01 DIAGNOSIS — F4320 Adjustment disorder, unspecified: Secondary | ICD-10-CM | POA: Diagnosis not present

## 2014-11-03 DIAGNOSIS — I10 Essential (primary) hypertension: Secondary | ICD-10-CM | POA: Diagnosis not present

## 2014-11-11 DIAGNOSIS — M797 Fibromyalgia: Secondary | ICD-10-CM | POA: Diagnosis not present

## 2014-11-11 DIAGNOSIS — M6281 Muscle weakness (generalized): Secondary | ICD-10-CM | POA: Diagnosis not present

## 2014-11-11 DIAGNOSIS — T50905A Adverse effect of unspecified drugs, medicaments and biological substances, initial encounter: Secondary | ICD-10-CM | POA: Diagnosis not present

## 2014-11-11 DIAGNOSIS — M129 Arthropathy, unspecified: Secondary | ICD-10-CM | POA: Diagnosis not present

## 2014-11-11 DIAGNOSIS — I4891 Unspecified atrial fibrillation: Secondary | ICD-10-CM | POA: Diagnosis not present

## 2014-11-11 DIAGNOSIS — F419 Anxiety disorder, unspecified: Secondary | ICD-10-CM | POA: Diagnosis not present

## 2014-11-15 DIAGNOSIS — I4891 Unspecified atrial fibrillation: Secondary | ICD-10-CM | POA: Diagnosis not present

## 2014-11-17 DIAGNOSIS — F329 Major depressive disorder, single episode, unspecified: Secondary | ICD-10-CM | POA: Diagnosis not present

## 2014-11-17 DIAGNOSIS — T50905A Adverse effect of unspecified drugs, medicaments and biological substances, initial encounter: Secondary | ICD-10-CM | POA: Diagnosis not present

## 2014-11-17 DIAGNOSIS — I5041 Acute combined systolic (congestive) and diastolic (congestive) heart failure: Secondary | ICD-10-CM | POA: Diagnosis not present

## 2014-11-17 DIAGNOSIS — I10 Essential (primary) hypertension: Secondary | ICD-10-CM | POA: Diagnosis not present

## 2014-11-17 DIAGNOSIS — G894 Chronic pain syndrome: Secondary | ICD-10-CM | POA: Diagnosis not present

## 2014-11-17 DIAGNOSIS — I4891 Unspecified atrial fibrillation: Secondary | ICD-10-CM | POA: Diagnosis not present

## 2014-11-17 DIAGNOSIS — M797 Fibromyalgia: Secondary | ICD-10-CM | POA: Diagnosis not present

## 2014-11-18 DIAGNOSIS — I429 Cardiomyopathy, unspecified: Secondary | ICD-10-CM | POA: Diagnosis not present

## 2014-11-18 DIAGNOSIS — I255 Ischemic cardiomyopathy: Secondary | ICD-10-CM | POA: Diagnosis not present

## 2014-11-18 DIAGNOSIS — I10 Essential (primary) hypertension: Secondary | ICD-10-CM | POA: Diagnosis not present

## 2014-11-18 DIAGNOSIS — I482 Chronic atrial fibrillation: Secondary | ICD-10-CM | POA: Diagnosis not present

## 2014-11-22 DIAGNOSIS — F4320 Adjustment disorder, unspecified: Secondary | ICD-10-CM | POA: Diagnosis not present

## 2014-11-24 DIAGNOSIS — I482 Chronic atrial fibrillation: Secondary | ICD-10-CM | POA: Diagnosis not present

## 2014-11-24 DIAGNOSIS — I429 Cardiomyopathy, unspecified: Secondary | ICD-10-CM | POA: Diagnosis not present

## 2014-11-25 DIAGNOSIS — I4891 Unspecified atrial fibrillation: Secondary | ICD-10-CM | POA: Diagnosis not present

## 2014-11-25 DIAGNOSIS — M797 Fibromyalgia: Secondary | ICD-10-CM | POA: Diagnosis not present

## 2014-11-25 DIAGNOSIS — H109 Unspecified conjunctivitis: Secondary | ICD-10-CM | POA: Diagnosis not present

## 2014-11-25 DIAGNOSIS — F329 Major depressive disorder, single episode, unspecified: Secondary | ICD-10-CM | POA: Diagnosis not present

## 2014-11-25 DIAGNOSIS — I10 Essential (primary) hypertension: Secondary | ICD-10-CM | POA: Diagnosis not present

## 2014-11-25 DIAGNOSIS — R42 Dizziness and giddiness: Secondary | ICD-10-CM | POA: Diagnosis not present

## 2014-11-29 DIAGNOSIS — I4891 Unspecified atrial fibrillation: Secondary | ICD-10-CM | POA: Diagnosis not present

## 2014-12-06 DIAGNOSIS — F4320 Adjustment disorder, unspecified: Secondary | ICD-10-CM | POA: Diagnosis not present

## 2014-12-12 DIAGNOSIS — H04123 Dry eye syndrome of bilateral lacrimal glands: Secondary | ICD-10-CM | POA: Diagnosis not present

## 2014-12-12 DIAGNOSIS — H1045 Other chronic allergic conjunctivitis: Secondary | ICD-10-CM | POA: Diagnosis not present

## 2014-12-12 DIAGNOSIS — Z961 Presence of intraocular lens: Secondary | ICD-10-CM | POA: Diagnosis not present

## 2014-12-12 DIAGNOSIS — F4320 Adjustment disorder, unspecified: Secondary | ICD-10-CM | POA: Diagnosis not present

## 2014-12-13 DIAGNOSIS — I429 Cardiomyopathy, unspecified: Secondary | ICD-10-CM | POA: Diagnosis not present

## 2014-12-13 DIAGNOSIS — I10 Essential (primary) hypertension: Secondary | ICD-10-CM | POA: Diagnosis not present

## 2014-12-13 DIAGNOSIS — J411 Mucopurulent chronic bronchitis: Secondary | ICD-10-CM | POA: Diagnosis not present

## 2014-12-16 DIAGNOSIS — F329 Major depressive disorder, single episode, unspecified: Secondary | ICD-10-CM | POA: Diagnosis not present

## 2014-12-16 DIAGNOSIS — R42 Dizziness and giddiness: Secondary | ICD-10-CM | POA: Diagnosis not present

## 2014-12-16 DIAGNOSIS — I4891 Unspecified atrial fibrillation: Secondary | ICD-10-CM | POA: Diagnosis not present

## 2014-12-16 DIAGNOSIS — R0789 Other chest pain: Secondary | ICD-10-CM | POA: Diagnosis not present

## 2014-12-16 DIAGNOSIS — M797 Fibromyalgia: Secondary | ICD-10-CM | POA: Diagnosis not present

## 2014-12-16 DIAGNOSIS — R079 Chest pain, unspecified: Secondary | ICD-10-CM | POA: Diagnosis not present

## 2014-12-16 DIAGNOSIS — F430 Acute stress reaction: Secondary | ICD-10-CM | POA: Diagnosis not present

## 2014-12-16 DIAGNOSIS — I10 Essential (primary) hypertension: Secondary | ICD-10-CM | POA: Diagnosis not present

## 2014-12-16 DIAGNOSIS — Z950 Presence of cardiac pacemaker: Secondary | ICD-10-CM | POA: Diagnosis not present

## 2014-12-16 DIAGNOSIS — I495 Sick sinus syndrome: Secondary | ICD-10-CM | POA: Diagnosis not present

## 2014-12-16 DIAGNOSIS — R109 Unspecified abdominal pain: Secondary | ICD-10-CM | POA: Diagnosis not present

## 2014-12-17 DIAGNOSIS — R112 Nausea with vomiting, unspecified: Secondary | ICD-10-CM | POA: Diagnosis not present

## 2014-12-17 DIAGNOSIS — M549 Dorsalgia, unspecified: Secondary | ICD-10-CM | POA: Diagnosis not present

## 2014-12-19 DIAGNOSIS — I4891 Unspecified atrial fibrillation: Secondary | ICD-10-CM | POA: Diagnosis not present

## 2014-12-19 DIAGNOSIS — I10 Essential (primary) hypertension: Secondary | ICD-10-CM | POA: Diagnosis not present

## 2014-12-19 DIAGNOSIS — R079 Chest pain, unspecified: Secondary | ICD-10-CM | POA: Diagnosis not present

## 2014-12-19 DIAGNOSIS — R109 Unspecified abdominal pain: Secondary | ICD-10-CM | POA: Diagnosis not present

## 2014-12-19 DIAGNOSIS — F419 Anxiety disorder, unspecified: Secondary | ICD-10-CM | POA: Diagnosis not present

## 2014-12-19 DIAGNOSIS — N200 Calculus of kidney: Secondary | ICD-10-CM | POA: Diagnosis not present

## 2014-12-19 DIAGNOSIS — R5383 Other fatigue: Secondary | ICD-10-CM | POA: Diagnosis not present

## 2014-12-19 DIAGNOSIS — R509 Fever, unspecified: Secondary | ICD-10-CM | POA: Diagnosis not present

## 2014-12-27 DIAGNOSIS — R921 Mammographic calcification found on diagnostic imaging of breast: Secondary | ICD-10-CM | POA: Diagnosis not present

## 2014-12-27 DIAGNOSIS — I10 Essential (primary) hypertension: Secondary | ICD-10-CM | POA: Diagnosis not present

## 2014-12-27 DIAGNOSIS — F329 Major depressive disorder, single episode, unspecified: Secondary | ICD-10-CM | POA: Diagnosis not present

## 2014-12-27 DIAGNOSIS — M797 Fibromyalgia: Secondary | ICD-10-CM | POA: Diagnosis not present

## 2014-12-27 DIAGNOSIS — F4320 Adjustment disorder, unspecified: Secondary | ICD-10-CM | POA: Diagnosis not present

## 2014-12-27 DIAGNOSIS — Z803 Family history of malignant neoplasm of breast: Secondary | ICD-10-CM | POA: Diagnosis not present

## 2014-12-27 DIAGNOSIS — J019 Acute sinusitis, unspecified: Secondary | ICD-10-CM | POA: Diagnosis not present

## 2014-12-27 DIAGNOSIS — I4891 Unspecified atrial fibrillation: Secondary | ICD-10-CM | POA: Diagnosis not present

## 2014-12-28 ENCOUNTER — Telehealth: Payer: Self-pay | Admitting: Pharmacist

## 2014-12-28 NOTE — Telephone Encounter (Signed)
Called to follow up protime appt. Spoke with her son Angela Wong Patient is still in [place].  He was anticipating that she would return to [facility] in December but she is trying to pack up and sell her house in [place].   He anticipates that she will be back in January.  She has physician in [place]. That is monitoring her INR /Protime

## 2014-12-29 DIAGNOSIS — R9341 Abnormal radiologic findings on diagnostic imaging of renal pelvis, ureter, or bladder: Secondary | ICD-10-CM | POA: Diagnosis not present

## 2014-12-29 DIAGNOSIS — R1012 Left upper quadrant pain: Secondary | ICD-10-CM | POA: Diagnosis not present

## 2015-01-03 DIAGNOSIS — J411 Mucopurulent chronic bronchitis: Secondary | ICD-10-CM | POA: Diagnosis not present

## 2015-01-03 DIAGNOSIS — H1131 Conjunctival hemorrhage, right eye: Secondary | ICD-10-CM | POA: Diagnosis not present

## 2015-01-03 DIAGNOSIS — I10 Essential (primary) hypertension: Secondary | ICD-10-CM | POA: Diagnosis not present

## 2015-01-04 DIAGNOSIS — I10 Essential (primary) hypertension: Secondary | ICD-10-CM | POA: Diagnosis not present

## 2015-01-04 DIAGNOSIS — I429 Cardiomyopathy, unspecified: Secondary | ICD-10-CM | POA: Diagnosis not present

## 2015-01-04 DIAGNOSIS — I4891 Unspecified atrial fibrillation: Secondary | ICD-10-CM | POA: Diagnosis not present

## 2015-01-04 DIAGNOSIS — I482 Chronic atrial fibrillation: Secondary | ICD-10-CM | POA: Diagnosis not present

## 2015-01-06 DIAGNOSIS — I4891 Unspecified atrial fibrillation: Secondary | ICD-10-CM | POA: Diagnosis not present

## 2015-01-06 DIAGNOSIS — N2889 Other specified disorders of kidney and ureter: Secondary | ICD-10-CM | POA: Diagnosis not present

## 2015-01-10 DIAGNOSIS — H1131 Conjunctival hemorrhage, right eye: Secondary | ICD-10-CM | POA: Diagnosis not present

## 2015-01-12 DIAGNOSIS — M129 Arthropathy, unspecified: Secondary | ICD-10-CM | POA: Diagnosis not present

## 2015-01-12 DIAGNOSIS — T50905A Adverse effect of unspecified drugs, medicaments and biological substances, initial encounter: Secondary | ICD-10-CM | POA: Diagnosis not present

## 2015-01-12 DIAGNOSIS — I4891 Unspecified atrial fibrillation: Secondary | ICD-10-CM | POA: Diagnosis not present

## 2015-01-12 DIAGNOSIS — R358 Other polyuria: Secondary | ICD-10-CM | POA: Diagnosis not present

## 2015-01-12 DIAGNOSIS — R0789 Other chest pain: Secondary | ICD-10-CM | POA: Diagnosis not present

## 2015-01-13 DIAGNOSIS — I4891 Unspecified atrial fibrillation: Secondary | ICD-10-CM | POA: Diagnosis not present

## 2015-01-17 DIAGNOSIS — F4320 Adjustment disorder, unspecified: Secondary | ICD-10-CM | POA: Diagnosis not present

## 2015-01-23 DIAGNOSIS — F4320 Adjustment disorder, unspecified: Secondary | ICD-10-CM | POA: Diagnosis not present

## 2015-01-24 DIAGNOSIS — I4891 Unspecified atrial fibrillation: Secondary | ICD-10-CM | POA: Diagnosis not present

## 2015-01-30 DIAGNOSIS — I4891 Unspecified atrial fibrillation: Secondary | ICD-10-CM | POA: Diagnosis not present

## 2015-02-03 DIAGNOSIS — I4891 Unspecified atrial fibrillation: Secondary | ICD-10-CM | POA: Diagnosis not present

## 2015-02-06 DIAGNOSIS — I4891 Unspecified atrial fibrillation: Secondary | ICD-10-CM | POA: Diagnosis not present

## 2015-02-07 DIAGNOSIS — F4320 Adjustment disorder, unspecified: Secondary | ICD-10-CM | POA: Diagnosis not present

## 2015-02-10 DIAGNOSIS — H1131 Conjunctival hemorrhage, right eye: Secondary | ICD-10-CM | POA: Diagnosis not present

## 2015-02-10 DIAGNOSIS — M3500 Sicca syndrome, unspecified: Secondary | ICD-10-CM | POA: Diagnosis not present

## 2015-02-10 DIAGNOSIS — H04123 Dry eye syndrome of bilateral lacrimal glands: Secondary | ICD-10-CM | POA: Diagnosis not present

## 2015-02-17 DIAGNOSIS — I4891 Unspecified atrial fibrillation: Secondary | ICD-10-CM | POA: Diagnosis not present

## 2015-02-21 DIAGNOSIS — F4320 Adjustment disorder, unspecified: Secondary | ICD-10-CM | POA: Diagnosis not present

## 2015-02-24 ENCOUNTER — Telehealth: Payer: Self-pay | Admitting: Pharmacist

## 2015-02-24 ENCOUNTER — Ambulatory Visit (INDEPENDENT_AMBULATORY_CARE_PROVIDER_SITE_OTHER): Payer: Self-pay | Admitting: Pharmacist

## 2015-02-24 DIAGNOSIS — I4891 Unspecified atrial fibrillation: Secondary | ICD-10-CM | POA: Diagnosis not present

## 2015-02-24 DIAGNOSIS — Z7901 Long term (current) use of anticoagulants: Secondary | ICD-10-CM

## 2015-02-24 DIAGNOSIS — I482 Chronic atrial fibrillation, unspecified: Secondary | ICD-10-CM

## 2015-02-24 NOTE — Telephone Encounter (Signed)
Patient is living in [place] currently and has MD there to monitor INR.

## 2015-02-24 NOTE — Progress Notes (Signed)
No charge - anticoag encounter opened only to indicate that patient has a new provider in [place] that is adjusting warfarin and checking INR

## 2015-02-28 DIAGNOSIS — F4320 Adjustment disorder, unspecified: Secondary | ICD-10-CM | POA: Diagnosis not present

## 2015-03-01 DIAGNOSIS — E559 Vitamin D deficiency, unspecified: Secondary | ICD-10-CM | POA: Diagnosis not present

## 2015-03-06 DIAGNOSIS — F4320 Adjustment disorder, unspecified: Secondary | ICD-10-CM | POA: Diagnosis not present

## 2015-03-09 DIAGNOSIS — I10 Essential (primary) hypertension: Secondary | ICD-10-CM | POA: Diagnosis not present

## 2015-03-09 DIAGNOSIS — E559 Vitamin D deficiency, unspecified: Secondary | ICD-10-CM | POA: Diagnosis not present

## 2015-03-09 DIAGNOSIS — I4891 Unspecified atrial fibrillation: Secondary | ICD-10-CM | POA: Diagnosis not present

## 2015-03-09 DIAGNOSIS — E213 Hyperparathyroidism, unspecified: Secondary | ICD-10-CM | POA: Diagnosis not present

## 2015-03-09 DIAGNOSIS — I482 Chronic atrial fibrillation: Secondary | ICD-10-CM | POA: Diagnosis not present

## 2015-03-09 DIAGNOSIS — F329 Major depressive disorder, single episode, unspecified: Secondary | ICD-10-CM | POA: Diagnosis not present

## 2015-03-09 DIAGNOSIS — G894 Chronic pain syndrome: Secondary | ICD-10-CM | POA: Diagnosis not present

## 2015-03-09 DIAGNOSIS — F419 Anxiety disorder, unspecified: Secondary | ICD-10-CM | POA: Diagnosis not present

## 2015-03-09 DIAGNOSIS — R6889 Other general symptoms and signs: Secondary | ICD-10-CM | POA: Diagnosis not present

## 2015-03-10 DIAGNOSIS — R5383 Other fatigue: Secondary | ICD-10-CM | POA: Diagnosis not present

## 2015-03-10 DIAGNOSIS — M791 Myalgia: Secondary | ICD-10-CM | POA: Diagnosis not present

## 2015-03-10 DIAGNOSIS — I4891 Unspecified atrial fibrillation: Secondary | ICD-10-CM | POA: Diagnosis not present

## 2015-03-13 DIAGNOSIS — M791 Myalgia: Secondary | ICD-10-CM | POA: Diagnosis not present

## 2015-03-13 DIAGNOSIS — R5383 Other fatigue: Secondary | ICD-10-CM | POA: Diagnosis not present

## 2015-03-14 DIAGNOSIS — M545 Low back pain: Secondary | ICD-10-CM | POA: Diagnosis not present

## 2015-03-14 DIAGNOSIS — F4320 Adjustment disorder, unspecified: Secondary | ICD-10-CM | POA: Diagnosis not present

## 2015-03-14 DIAGNOSIS — M62838 Other muscle spasm: Secondary | ICD-10-CM | POA: Diagnosis not present

## 2015-03-14 DIAGNOSIS — S39012D Strain of muscle, fascia and tendon of lower back, subsequent encounter: Secondary | ICD-10-CM | POA: Diagnosis not present

## 2015-03-14 DIAGNOSIS — Z9181 History of falling: Secondary | ICD-10-CM | POA: Diagnosis not present

## 2015-03-14 DIAGNOSIS — M62830 Muscle spasm of back: Secondary | ICD-10-CM | POA: Diagnosis not present

## 2015-03-14 DIAGNOSIS — M170 Bilateral primary osteoarthritis of knee: Secondary | ICD-10-CM | POA: Diagnosis not present

## 2015-03-14 DIAGNOSIS — M6281 Muscle weakness (generalized): Secondary | ICD-10-CM | POA: Diagnosis not present

## 2015-03-17 DIAGNOSIS — Z950 Presence of cardiac pacemaker: Secondary | ICD-10-CM | POA: Diagnosis not present

## 2015-03-17 DIAGNOSIS — I495 Sick sinus syndrome: Secondary | ICD-10-CM | POA: Diagnosis not present

## 2015-03-17 DIAGNOSIS — I4891 Unspecified atrial fibrillation: Secondary | ICD-10-CM | POA: Diagnosis not present

## 2015-03-20 DIAGNOSIS — F4320 Adjustment disorder, unspecified: Secondary | ICD-10-CM | POA: Diagnosis not present

## 2015-03-21 DIAGNOSIS — M170 Bilateral primary osteoarthritis of knee: Secondary | ICD-10-CM | POA: Diagnosis not present

## 2015-03-21 DIAGNOSIS — Z9181 History of falling: Secondary | ICD-10-CM | POA: Diagnosis not present

## 2015-03-21 DIAGNOSIS — M62838 Other muscle spasm: Secondary | ICD-10-CM | POA: Diagnosis not present

## 2015-03-21 DIAGNOSIS — M6281 Muscle weakness (generalized): Secondary | ICD-10-CM | POA: Diagnosis not present

## 2015-03-21 DIAGNOSIS — M62830 Muscle spasm of back: Secondary | ICD-10-CM | POA: Diagnosis not present

## 2015-03-21 DIAGNOSIS — M545 Low back pain: Secondary | ICD-10-CM | POA: Diagnosis not present

## 2015-03-21 DIAGNOSIS — S39012D Strain of muscle, fascia and tendon of lower back, subsequent encounter: Secondary | ICD-10-CM | POA: Diagnosis not present

## 2015-03-23 DIAGNOSIS — M62830 Muscle spasm of back: Secondary | ICD-10-CM | POA: Diagnosis not present

## 2015-03-23 DIAGNOSIS — Z9181 History of falling: Secondary | ICD-10-CM | POA: Diagnosis not present

## 2015-03-23 DIAGNOSIS — M62838 Other muscle spasm: Secondary | ICD-10-CM | POA: Diagnosis not present

## 2015-03-23 DIAGNOSIS — M545 Low back pain: Secondary | ICD-10-CM | POA: Diagnosis not present

## 2015-03-23 DIAGNOSIS — S39012D Strain of muscle, fascia and tendon of lower back, subsequent encounter: Secondary | ICD-10-CM | POA: Diagnosis not present

## 2015-03-23 DIAGNOSIS — M6281 Muscle weakness (generalized): Secondary | ICD-10-CM | POA: Diagnosis not present

## 2015-03-23 DIAGNOSIS — I4891 Unspecified atrial fibrillation: Secondary | ICD-10-CM | POA: Diagnosis not present

## 2015-03-23 DIAGNOSIS — M170 Bilateral primary osteoarthritis of knee: Secondary | ICD-10-CM | POA: Diagnosis not present

## 2015-03-24 DIAGNOSIS — K209 Esophagitis, unspecified: Secondary | ICD-10-CM | POA: Diagnosis not present

## 2015-03-24 DIAGNOSIS — M62838 Other muscle spasm: Secondary | ICD-10-CM | POA: Diagnosis not present

## 2015-03-24 DIAGNOSIS — M791 Myalgia: Secondary | ICD-10-CM | POA: Diagnosis not present

## 2015-03-30 DIAGNOSIS — M62838 Other muscle spasm: Secondary | ICD-10-CM | POA: Diagnosis not present

## 2015-03-30 DIAGNOSIS — M170 Bilateral primary osteoarthritis of knee: Secondary | ICD-10-CM | POA: Diagnosis not present

## 2015-03-30 DIAGNOSIS — S39012D Strain of muscle, fascia and tendon of lower back, subsequent encounter: Secondary | ICD-10-CM | POA: Diagnosis not present

## 2015-03-30 DIAGNOSIS — Z9181 History of falling: Secondary | ICD-10-CM | POA: Diagnosis not present

## 2015-03-30 DIAGNOSIS — M62830 Muscle spasm of back: Secondary | ICD-10-CM | POA: Diagnosis not present

## 2015-03-30 DIAGNOSIS — M545 Low back pain: Secondary | ICD-10-CM | POA: Diagnosis not present

## 2015-03-30 DIAGNOSIS — M6281 Muscle weakness (generalized): Secondary | ICD-10-CM | POA: Diagnosis not present

## 2015-04-04 DIAGNOSIS — I4891 Unspecified atrial fibrillation: Secondary | ICD-10-CM | POA: Diagnosis not present

## 2015-04-04 DIAGNOSIS — I495 Sick sinus syndrome: Secondary | ICD-10-CM | POA: Diagnosis not present

## 2015-04-04 DIAGNOSIS — I429 Cardiomyopathy, unspecified: Secondary | ICD-10-CM | POA: Diagnosis not present

## 2015-04-04 DIAGNOSIS — Z9181 History of falling: Secondary | ICD-10-CM | POA: Diagnosis not present

## 2015-04-04 DIAGNOSIS — M170 Bilateral primary osteoarthritis of knee: Secondary | ICD-10-CM | POA: Diagnosis not present

## 2015-04-04 DIAGNOSIS — I10 Essential (primary) hypertension: Secondary | ICD-10-CM | POA: Diagnosis not present

## 2015-04-04 DIAGNOSIS — S39012D Strain of muscle, fascia and tendon of lower back, subsequent encounter: Secondary | ICD-10-CM | POA: Diagnosis not present

## 2015-04-04 DIAGNOSIS — M6281 Muscle weakness (generalized): Secondary | ICD-10-CM | POA: Diagnosis not present

## 2015-04-04 DIAGNOSIS — M62838 Other muscle spasm: Secondary | ICD-10-CM | POA: Diagnosis not present

## 2015-04-04 DIAGNOSIS — K5730 Diverticulosis of large intestine without perforation or abscess without bleeding: Secondary | ICD-10-CM | POA: Diagnosis not present

## 2015-04-04 DIAGNOSIS — M62830 Muscle spasm of back: Secondary | ICD-10-CM | POA: Diagnosis not present

## 2015-04-04 DIAGNOSIS — M545 Low back pain: Secondary | ICD-10-CM | POA: Diagnosis not present

## 2015-04-06 DIAGNOSIS — M62838 Other muscle spasm: Secondary | ICD-10-CM | POA: Diagnosis not present

## 2015-04-06 DIAGNOSIS — Z9181 History of falling: Secondary | ICD-10-CM | POA: Diagnosis not present

## 2015-04-06 DIAGNOSIS — M545 Low back pain: Secondary | ICD-10-CM | POA: Diagnosis not present

## 2015-04-06 DIAGNOSIS — M62830 Muscle spasm of back: Secondary | ICD-10-CM | POA: Diagnosis not present

## 2015-04-06 DIAGNOSIS — S39012D Strain of muscle, fascia and tendon of lower back, subsequent encounter: Secondary | ICD-10-CM | POA: Diagnosis not present

## 2015-04-06 DIAGNOSIS — M170 Bilateral primary osteoarthritis of knee: Secondary | ICD-10-CM | POA: Diagnosis not present

## 2015-04-06 DIAGNOSIS — M6281 Muscle weakness (generalized): Secondary | ICD-10-CM | POA: Diagnosis not present

## 2015-04-11 DIAGNOSIS — S39012D Strain of muscle, fascia and tendon of lower back, subsequent encounter: Secondary | ICD-10-CM | POA: Diagnosis not present

## 2015-04-11 DIAGNOSIS — Z9181 History of falling: Secondary | ICD-10-CM | POA: Diagnosis not present

## 2015-04-11 DIAGNOSIS — I483 Typical atrial flutter: Secondary | ICD-10-CM | POA: Diagnosis not present

## 2015-04-11 DIAGNOSIS — M6281 Muscle weakness (generalized): Secondary | ICD-10-CM | POA: Diagnosis not present

## 2015-04-11 DIAGNOSIS — M129 Arthropathy, unspecified: Secondary | ICD-10-CM | POA: Diagnosis not present

## 2015-04-11 DIAGNOSIS — F419 Anxiety disorder, unspecified: Secondary | ICD-10-CM | POA: Diagnosis not present

## 2015-04-11 DIAGNOSIS — F4320 Adjustment disorder, unspecified: Secondary | ICD-10-CM | POA: Diagnosis not present

## 2015-04-11 DIAGNOSIS — N390 Urinary tract infection, site not specified: Secondary | ICD-10-CM | POA: Diagnosis not present

## 2015-04-11 DIAGNOSIS — R42 Dizziness and giddiness: Secondary | ICD-10-CM | POA: Diagnosis not present

## 2015-04-11 DIAGNOSIS — M62838 Other muscle spasm: Secondary | ICD-10-CM | POA: Diagnosis not present

## 2015-04-11 DIAGNOSIS — M170 Bilateral primary osteoarthritis of knee: Secondary | ICD-10-CM | POA: Diagnosis not present

## 2015-04-11 DIAGNOSIS — I482 Chronic atrial fibrillation: Secondary | ICD-10-CM | POA: Diagnosis not present

## 2015-04-11 DIAGNOSIS — M62830 Muscle spasm of back: Secondary | ICD-10-CM | POA: Diagnosis not present

## 2015-04-11 DIAGNOSIS — M25551 Pain in right hip: Secondary | ICD-10-CM | POA: Diagnosis not present

## 2015-04-11 DIAGNOSIS — M545 Low back pain: Secondary | ICD-10-CM | POA: Diagnosis not present

## 2015-04-11 DIAGNOSIS — G894 Chronic pain syndrome: Secondary | ICD-10-CM | POA: Diagnosis not present

## 2015-04-12 DIAGNOSIS — M25551 Pain in right hip: Secondary | ICD-10-CM | POA: Diagnosis not present

## 2015-04-12 DIAGNOSIS — M79651 Pain in right thigh: Secondary | ICD-10-CM | POA: Diagnosis not present

## 2015-04-12 DIAGNOSIS — R1030 Lower abdominal pain, unspecified: Secondary | ICD-10-CM | POA: Diagnosis not present

## 2015-04-14 DIAGNOSIS — M7071 Other bursitis of hip, right hip: Secondary | ICD-10-CM | POA: Diagnosis not present

## 2015-04-18 DIAGNOSIS — F4320 Adjustment disorder, unspecified: Secondary | ICD-10-CM | POA: Diagnosis not present

## 2015-04-21 DIAGNOSIS — I4891 Unspecified atrial fibrillation: Secondary | ICD-10-CM | POA: Diagnosis not present

## 2015-04-24 DIAGNOSIS — F4320 Adjustment disorder, unspecified: Secondary | ICD-10-CM | POA: Diagnosis not present

## 2015-05-04 NOTE — Therapy (Signed)
[facility] [place], [place], [address] Phone: [phone]   Fax:  [phone]  Physical Therapy Treatment  Patient Details  Name: Angela Wong MRN: [id number] Date of Birth: 1940/02/25 No Data Recorded  Encounter Date: 05/23/2014    Past Medical History  Diagnosis Date  . A-fib   . Sjogren's syndrome   . Raynaud's disease   . RLS (restless legs syndrome)   . IBS (irritable bowel syndrome)   . MVP (mitral valve prolapse)   . Cerebral vasculitis   . Osteoarthritis   . Asthma   . Ovarian cancer     lymph node removal with hysterectomy  . COPD (chronic obstructive pulmonary disease)   . GERD (gastroesophageal reflux disease)   . Fibromyalgia     Past Surgical History  Procedure Laterality Date  . Appendectomy    . Abdominal hysterectomy    . Cataract extraction Left   . Breast surgery      Biopsy    There were no vitals filed for this visit.                                 PT Short Term Goals - 05/23/14 0931    PT SHORT TERM GOAL #1   Title Ind. with HP.   Time 6   Period Weeks   Status Achieved   PT SHORT TERM GOAL #2   Title Active right shoulder flexion to 145 degrees so the patient can easily reach overhead.   Time 6   Period Weeks   Status Not Met  130 degrees antigravity (full passive)   PT SHORT TERM GOAL #3   Title Active ER to 70 degrees+ to allow for easily donning/doffing of apparel   Time 6   Period Weeks   Status Achieved                Patient will benefit from skilled therapeutic intervention in order to improve the following deficits and impairments:  Pain, Decreased strength  Visit Diagnosis: Shoulder stiffness, right  Right shoulder pain     Problem List Patient Active Problem List   Diagnosis Date Noted  . Rotator cuff tear 03/23/2014  . Osteoarthritis   . Asthma   . Ovarian cancer ([place])   . COPD (chronic obstructive  pulmonary disease) ([place])   . Fibromyalgia   . RLS (restless legs syndrome)   . Cerebral vasculitis   . Sjogren's syndrome ([place])   . Raynaud's disease   . MVP (mitral valve prolapse)   . IBS (irritable bowel syndrome)   . Congestive dilated cardiomyopathy ([place]) 02/09/2014  . Dyspnea 12/13/2013  . Hyperlipidemia 11/26/2013  . Malaise and fatigue 11/26/2013  . Multiple drug allergies 11/02/2013  . History of ovarian cancer 10/01/2013  . GERD (gastroesophageal reflux disease) 10/01/2013  . Benign essential HTN 10/01/2013   PHYSICAL THERAPY DISCHARGE SUMMARY  Visits from Start of Care: 16  Current functional level related to goals / functional outcomes: Please see above.   Remaining deficits: Loss of antigravity shoulder flexion.   Education / Equipment: HEP.  Plan: Patient agrees to discharge.  Patient goals were partially met. Patient is being discharged due to meeting the stated rehab goals.  ?????      Angela Wong, [place] MPT 05/04/2015, 6:25 PM  [facility] [address] [place], [place], [address] Phone: [phone]   Fax:  [phone]  Name:  Angela Wong MRN: [id number] Date of Birth: 12/10/1940

## 2015-05-09 DIAGNOSIS — F4320 Adjustment disorder, unspecified: Secondary | ICD-10-CM | POA: Diagnosis not present

## 2015-05-12 DIAGNOSIS — M129 Arthropathy, unspecified: Secondary | ICD-10-CM | POA: Diagnosis not present

## 2015-05-12 DIAGNOSIS — F430 Acute stress reaction: Secondary | ICD-10-CM | POA: Diagnosis not present

## 2015-05-12 DIAGNOSIS — I10 Essential (primary) hypertension: Secondary | ICD-10-CM | POA: Diagnosis not present

## 2015-05-12 DIAGNOSIS — M25551 Pain in right hip: Secondary | ICD-10-CM | POA: Diagnosis not present

## 2015-05-12 DIAGNOSIS — G8921 Chronic pain due to trauma: Secondary | ICD-10-CM | POA: Diagnosis not present

## 2015-05-12 DIAGNOSIS — M797 Fibromyalgia: Secondary | ICD-10-CM | POA: Diagnosis not present

## 2015-05-12 DIAGNOSIS — F329 Major depressive disorder, single episode, unspecified: Secondary | ICD-10-CM | POA: Diagnosis not present

## 2015-05-15 DIAGNOSIS — F4320 Adjustment disorder, unspecified: Secondary | ICD-10-CM | POA: Diagnosis not present

## 2015-05-16 DIAGNOSIS — M797 Fibromyalgia: Secondary | ICD-10-CM | POA: Diagnosis not present

## 2015-05-16 DIAGNOSIS — M129 Arthropathy, unspecified: Secondary | ICD-10-CM | POA: Diagnosis not present

## 2015-05-16 DIAGNOSIS — I10 Essential (primary) hypertension: Secondary | ICD-10-CM | POA: Diagnosis not present

## 2015-05-16 DIAGNOSIS — F430 Acute stress reaction: Secondary | ICD-10-CM | POA: Diagnosis not present

## 2015-05-16 DIAGNOSIS — F329 Major depressive disorder, single episode, unspecified: Secondary | ICD-10-CM | POA: Diagnosis not present

## 2015-05-16 DIAGNOSIS — G8921 Chronic pain due to trauma: Secondary | ICD-10-CM | POA: Diagnosis not present

## 2015-05-16 DIAGNOSIS — M25551 Pain in right hip: Secondary | ICD-10-CM | POA: Diagnosis not present

## 2015-05-18 DIAGNOSIS — E213 Hyperparathyroidism, unspecified: Secondary | ICD-10-CM | POA: Diagnosis not present

## 2015-05-18 DIAGNOSIS — R252 Cramp and spasm: Secondary | ICD-10-CM | POA: Diagnosis not present

## 2015-05-18 DIAGNOSIS — M2550 Pain in unspecified joint: Secondary | ICD-10-CM | POA: Diagnosis not present

## 2015-05-18 DIAGNOSIS — E7800 Pure hypercholesterolemia, unspecified: Secondary | ICD-10-CM | POA: Diagnosis not present

## 2015-05-18 DIAGNOSIS — T461X5A Adverse effect of calcium-channel blockers, initial encounter: Secondary | ICD-10-CM | POA: Diagnosis not present

## 2015-05-18 DIAGNOSIS — M6281 Muscle weakness (generalized): Secondary | ICD-10-CM | POA: Diagnosis not present

## 2015-05-18 DIAGNOSIS — I10 Essential (primary) hypertension: Secondary | ICD-10-CM | POA: Diagnosis not present

## 2015-05-18 DIAGNOSIS — I4891 Unspecified atrial fibrillation: Secondary | ICD-10-CM | POA: Diagnosis not present

## 2015-05-18 DIAGNOSIS — I483 Typical atrial flutter: Secondary | ICD-10-CM | POA: Diagnosis not present

## 2015-05-18 DIAGNOSIS — I482 Chronic atrial fibrillation: Secondary | ICD-10-CM | POA: Diagnosis not present

## 2015-05-25 DIAGNOSIS — F419 Anxiety disorder, unspecified: Secondary | ICD-10-CM | POA: Diagnosis not present

## 2015-05-25 DIAGNOSIS — F329 Major depressive disorder, single episode, unspecified: Secondary | ICD-10-CM | POA: Diagnosis not present

## 2015-05-25 DIAGNOSIS — M542 Cervicalgia: Secondary | ICD-10-CM | POA: Diagnosis not present

## 2015-05-25 DIAGNOSIS — I10 Essential (primary) hypertension: Secondary | ICD-10-CM | POA: Diagnosis not present

## 2015-05-25 DIAGNOSIS — G4700 Insomnia, unspecified: Secondary | ICD-10-CM | POA: Diagnosis not present

## 2015-06-05 DIAGNOSIS — F4320 Adjustment disorder, unspecified: Secondary | ICD-10-CM | POA: Diagnosis not present

## 2015-06-06 DIAGNOSIS — I10 Essential (primary) hypertension: Secondary | ICD-10-CM | POA: Diagnosis not present

## 2015-06-06 DIAGNOSIS — K210 Gastro-esophageal reflux disease with esophagitis: Secondary | ICD-10-CM | POA: Diagnosis not present

## 2015-06-06 DIAGNOSIS — J411 Mucopurulent chronic bronchitis: Secondary | ICD-10-CM | POA: Diagnosis not present

## 2015-06-15 DIAGNOSIS — I4891 Unspecified atrial fibrillation: Secondary | ICD-10-CM | POA: Diagnosis not present

## 2015-06-16 DIAGNOSIS — I4891 Unspecified atrial fibrillation: Secondary | ICD-10-CM | POA: Diagnosis not present

## 2015-06-16 DIAGNOSIS — Z950 Presence of cardiac pacemaker: Secondary | ICD-10-CM | POA: Diagnosis not present

## 2015-06-16 DIAGNOSIS — I495 Sick sinus syndrome: Secondary | ICD-10-CM | POA: Diagnosis not present

## 2015-06-20 DIAGNOSIS — F430 Acute stress reaction: Secondary | ICD-10-CM | POA: Diagnosis not present

## 2015-06-20 DIAGNOSIS — R609 Edema, unspecified: Secondary | ICD-10-CM | POA: Diagnosis not present

## 2015-06-20 DIAGNOSIS — R0602 Shortness of breath: Secondary | ICD-10-CM | POA: Diagnosis not present

## 2015-06-20 DIAGNOSIS — M797 Fibromyalgia: Secondary | ICD-10-CM | POA: Diagnosis not present

## 2015-06-20 DIAGNOSIS — G894 Chronic pain syndrome: Secondary | ICD-10-CM | POA: Diagnosis not present

## 2015-06-20 DIAGNOSIS — R600 Localized edema: Secondary | ICD-10-CM | POA: Diagnosis not present

## 2015-06-20 DIAGNOSIS — F419 Anxiety disorder, unspecified: Secondary | ICD-10-CM | POA: Diagnosis not present

## 2015-06-20 DIAGNOSIS — R5383 Other fatigue: Secondary | ICD-10-CM | POA: Diagnosis not present

## 2015-06-20 DIAGNOSIS — F4320 Adjustment disorder, unspecified: Secondary | ICD-10-CM | POA: Diagnosis not present

## 2015-06-20 DIAGNOSIS — F329 Major depressive disorder, single episode, unspecified: Secondary | ICD-10-CM | POA: Diagnosis not present

## 2015-06-22 DIAGNOSIS — Z803 Family history of malignant neoplasm of breast: Secondary | ICD-10-CM | POA: Diagnosis not present

## 2015-06-22 DIAGNOSIS — I4891 Unspecified atrial fibrillation: Secondary | ICD-10-CM | POA: Diagnosis not present

## 2015-06-22 DIAGNOSIS — Z1231 Encounter for screening mammogram for malignant neoplasm of breast: Secondary | ICD-10-CM | POA: Diagnosis not present

## 2015-06-26 DIAGNOSIS — F4320 Adjustment disorder, unspecified: Secondary | ICD-10-CM | POA: Diagnosis not present

## 2015-06-28 DIAGNOSIS — M3500 Sicca syndrome, unspecified: Secondary | ICD-10-CM | POA: Diagnosis not present

## 2015-06-28 DIAGNOSIS — H16103 Unspecified superficial keratitis, bilateral: Secondary | ICD-10-CM | POA: Diagnosis not present

## 2015-06-28 DIAGNOSIS — H4010X1 Unspecified open-angle glaucoma, mild stage: Secondary | ICD-10-CM | POA: Diagnosis not present

## 2015-06-28 DIAGNOSIS — G43809 Other migraine, not intractable, without status migrainosus: Secondary | ICD-10-CM | POA: Diagnosis not present

## 2015-06-28 DIAGNOSIS — H53433 Sector or arcuate defects, bilateral: Secondary | ICD-10-CM | POA: Diagnosis not present

## 2015-07-04 ENCOUNTER — Encounter: Payer: Self-pay | Admitting: Family

## 2015-07-04 DIAGNOSIS — R5383 Other fatigue: Secondary | ICD-10-CM | POA: Diagnosis not present

## 2015-07-04 DIAGNOSIS — S339XXA Sprain of unspecified parts of lumbar spine and pelvis, initial encounter: Secondary | ICD-10-CM | POA: Diagnosis not present

## 2015-07-04 DIAGNOSIS — F4320 Adjustment disorder, unspecified: Secondary | ICD-10-CM | POA: Diagnosis not present

## 2015-07-04 DIAGNOSIS — I4891 Unspecified atrial fibrillation: Secondary | ICD-10-CM | POA: Diagnosis not present

## 2015-07-04 DIAGNOSIS — F430 Acute stress reaction: Secondary | ICD-10-CM | POA: Diagnosis not present

## 2015-07-04 DIAGNOSIS — M129 Arthropathy, unspecified: Secondary | ICD-10-CM | POA: Diagnosis not present

## 2015-07-04 DIAGNOSIS — I10 Essential (primary) hypertension: Secondary | ICD-10-CM | POA: Diagnosis not present

## 2015-07-04 DIAGNOSIS — F329 Major depressive disorder, single episode, unspecified: Secondary | ICD-10-CM | POA: Diagnosis not present

## 2015-07-06 DIAGNOSIS — I4891 Unspecified atrial fibrillation: Secondary | ICD-10-CM | POA: Diagnosis not present

## 2015-07-07 DIAGNOSIS — I482 Chronic atrial fibrillation: Secondary | ICD-10-CM | POA: Diagnosis not present

## 2015-07-07 DIAGNOSIS — I429 Cardiomyopathy, unspecified: Secondary | ICD-10-CM | POA: Diagnosis not present

## 2015-07-07 DIAGNOSIS — I5022 Chronic systolic (congestive) heart failure: Secondary | ICD-10-CM | POA: Diagnosis not present

## 2015-07-07 DIAGNOSIS — I10 Essential (primary) hypertension: Secondary | ICD-10-CM | POA: Diagnosis not present

## 2015-07-11 DIAGNOSIS — R5383 Other fatigue: Secondary | ICD-10-CM | POA: Diagnosis not present

## 2015-07-11 DIAGNOSIS — M129 Arthropathy, unspecified: Secondary | ICD-10-CM | POA: Diagnosis not present

## 2015-07-11 DIAGNOSIS — F430 Acute stress reaction: Secondary | ICD-10-CM | POA: Diagnosis not present

## 2015-07-11 DIAGNOSIS — F4320 Adjustment disorder, unspecified: Secondary | ICD-10-CM | POA: Diagnosis not present

## 2015-07-11 DIAGNOSIS — S339XXA Sprain of unspecified parts of lumbar spine and pelvis, initial encounter: Secondary | ICD-10-CM | POA: Diagnosis not present

## 2015-07-11 DIAGNOSIS — Z0000 Encounter for general adult medical examination without abnormal findings: Secondary | ICD-10-CM | POA: Diagnosis not present

## 2015-07-17 DIAGNOSIS — F4320 Adjustment disorder, unspecified: Secondary | ICD-10-CM | POA: Diagnosis not present

## 2015-07-21 DIAGNOSIS — I10 Essential (primary) hypertension: Secondary | ICD-10-CM | POA: Diagnosis not present

## 2015-07-21 DIAGNOSIS — J069 Acute upper respiratory infection, unspecified: Secondary | ICD-10-CM | POA: Diagnosis not present

## 2015-07-21 DIAGNOSIS — M129 Arthropathy, unspecified: Secondary | ICD-10-CM | POA: Diagnosis not present

## 2015-07-21 DIAGNOSIS — R509 Fever, unspecified: Secondary | ICD-10-CM | POA: Diagnosis not present

## 2015-07-21 DIAGNOSIS — R1013 Epigastric pain: Secondary | ICD-10-CM | POA: Diagnosis not present

## 2015-07-21 DIAGNOSIS — S339XXA Sprain of unspecified parts of lumbar spine and pelvis, initial encounter: Secondary | ICD-10-CM | POA: Diagnosis not present

## 2015-07-21 DIAGNOSIS — R809 Proteinuria, unspecified: Secondary | ICD-10-CM | POA: Diagnosis not present

## 2015-07-21 DIAGNOSIS — N390 Urinary tract infection, site not specified: Secondary | ICD-10-CM | POA: Diagnosis not present

## 2015-07-21 DIAGNOSIS — N952 Postmenopausal atrophic vaginitis: Secondary | ICD-10-CM | POA: Diagnosis not present

## 2015-07-21 DIAGNOSIS — M2669 Other specified disorders of temporomandibular joint: Secondary | ICD-10-CM | POA: Diagnosis not present

## 2015-07-21 DIAGNOSIS — M25551 Pain in right hip: Secondary | ICD-10-CM | POA: Diagnosis not present

## 2015-07-21 DIAGNOSIS — R5383 Other fatigue: Secondary | ICD-10-CM | POA: Diagnosis not present

## 2015-07-21 DIAGNOSIS — N761 Subacute and chronic vaginitis: Secondary | ICD-10-CM | POA: Diagnosis not present

## 2015-07-21 DIAGNOSIS — F430 Acute stress reaction: Secondary | ICD-10-CM | POA: Diagnosis not present

## 2015-07-21 DIAGNOSIS — I4891 Unspecified atrial fibrillation: Secondary | ICD-10-CM | POA: Diagnosis not present

## 2015-07-21 DIAGNOSIS — Z0000 Encounter for general adult medical examination without abnormal findings: Secondary | ICD-10-CM | POA: Diagnosis not present

## 2015-07-21 DIAGNOSIS — R6889 Other general symptoms and signs: Secondary | ICD-10-CM | POA: Diagnosis not present

## 2015-07-24 DIAGNOSIS — F4320 Adjustment disorder, unspecified: Secondary | ICD-10-CM | POA: Diagnosis not present

## 2015-07-27 DIAGNOSIS — I4891 Unspecified atrial fibrillation: Secondary | ICD-10-CM | POA: Diagnosis not present

## 2015-07-31 DIAGNOSIS — S339XXA Sprain of unspecified parts of lumbar spine and pelvis, initial encounter: Secondary | ICD-10-CM | POA: Diagnosis not present

## 2015-07-31 DIAGNOSIS — J069 Acute upper respiratory infection, unspecified: Secondary | ICD-10-CM | POA: Diagnosis not present

## 2015-07-31 DIAGNOSIS — I4891 Unspecified atrial fibrillation: Secondary | ICD-10-CM | POA: Diagnosis not present

## 2015-07-31 DIAGNOSIS — F329 Major depressive disorder, single episode, unspecified: Secondary | ICD-10-CM | POA: Diagnosis not present

## 2015-07-31 DIAGNOSIS — J4520 Mild intermittent asthma, uncomplicated: Secondary | ICD-10-CM | POA: Diagnosis not present

## 2015-07-31 DIAGNOSIS — F4320 Adjustment disorder, unspecified: Secondary | ICD-10-CM | POA: Diagnosis not present

## 2015-07-31 DIAGNOSIS — I10 Essential (primary) hypertension: Secondary | ICD-10-CM | POA: Diagnosis not present

## 2015-07-31 DIAGNOSIS — R5383 Other fatigue: Secondary | ICD-10-CM | POA: Diagnosis not present

## 2015-07-31 DIAGNOSIS — F430 Acute stress reaction: Secondary | ICD-10-CM | POA: Diagnosis not present

## 2015-07-31 DIAGNOSIS — M129 Arthropathy, unspecified: Secondary | ICD-10-CM | POA: Diagnosis not present

## 2015-07-31 DIAGNOSIS — G4700 Insomnia, unspecified: Secondary | ICD-10-CM | POA: Diagnosis not present

## 2015-08-08 DIAGNOSIS — F4320 Adjustment disorder, unspecified: Secondary | ICD-10-CM | POA: Diagnosis not present

## 2015-08-08 DIAGNOSIS — M797 Fibromyalgia: Secondary | ICD-10-CM | POA: Diagnosis not present

## 2015-08-08 DIAGNOSIS — I482 Chronic atrial fibrillation: Secondary | ICD-10-CM | POA: Diagnosis not present

## 2015-08-08 DIAGNOSIS — M129 Arthropathy, unspecified: Secondary | ICD-10-CM | POA: Diagnosis not present

## 2015-08-08 DIAGNOSIS — I10 Essential (primary) hypertension: Secondary | ICD-10-CM | POA: Diagnosis not present

## 2015-08-08 DIAGNOSIS — F419 Anxiety disorder, unspecified: Secondary | ICD-10-CM | POA: Diagnosis not present

## 2015-08-08 DIAGNOSIS — S339XXA Sprain of unspecified parts of lumbar spine and pelvis, initial encounter: Secondary | ICD-10-CM | POA: Diagnosis not present

## 2015-08-08 DIAGNOSIS — M25569 Pain in unspecified knee: Secondary | ICD-10-CM | POA: Diagnosis not present

## 2015-08-08 DIAGNOSIS — G894 Chronic pain syndrome: Secondary | ICD-10-CM | POA: Diagnosis not present

## 2015-08-15 DIAGNOSIS — F4320 Adjustment disorder, unspecified: Secondary | ICD-10-CM | POA: Diagnosis not present

## 2015-08-21 DIAGNOSIS — F4320 Adjustment disorder, unspecified: Secondary | ICD-10-CM | POA: Diagnosis not present

## 2015-08-21 DIAGNOSIS — M129 Arthropathy, unspecified: Secondary | ICD-10-CM | POA: Diagnosis not present

## 2015-08-21 DIAGNOSIS — I4891 Unspecified atrial fibrillation: Secondary | ICD-10-CM | POA: Diagnosis not present

## 2015-08-21 DIAGNOSIS — F419 Anxiety disorder, unspecified: Secondary | ICD-10-CM | POA: Diagnosis not present

## 2015-08-21 DIAGNOSIS — N952 Postmenopausal atrophic vaginitis: Secondary | ICD-10-CM | POA: Diagnosis not present

## 2015-08-21 DIAGNOSIS — N761 Subacute and chronic vaginitis: Secondary | ICD-10-CM | POA: Diagnosis not present

## 2015-08-21 DIAGNOSIS — I482 Chronic atrial fibrillation: Secondary | ICD-10-CM | POA: Diagnosis not present

## 2015-08-21 DIAGNOSIS — I10 Essential (primary) hypertension: Secondary | ICD-10-CM | POA: Diagnosis not present

## 2015-08-21 DIAGNOSIS — R42 Dizziness and giddiness: Secondary | ICD-10-CM | POA: Diagnosis not present

## 2015-08-22 DIAGNOSIS — R531 Weakness: Secondary | ICD-10-CM | POA: Diagnosis not present

## 2015-08-22 DIAGNOSIS — E559 Vitamin D deficiency, unspecified: Secondary | ICD-10-CM | POA: Diagnosis not present

## 2015-08-22 DIAGNOSIS — R5383 Other fatigue: Secondary | ICD-10-CM | POA: Diagnosis not present

## 2015-08-22 DIAGNOSIS — R7989 Other specified abnormal findings of blood chemistry: Secondary | ICD-10-CM | POA: Diagnosis not present

## 2015-08-22 DIAGNOSIS — E7800 Pure hypercholesterolemia, unspecified: Secondary | ICD-10-CM | POA: Diagnosis not present

## 2015-08-22 DIAGNOSIS — R42 Dizziness and giddiness: Secondary | ICD-10-CM | POA: Diagnosis not present

## 2015-08-22 DIAGNOSIS — I509 Heart failure, unspecified: Secondary | ICD-10-CM | POA: Diagnosis not present

## 2015-09-04 DIAGNOSIS — L570 Actinic keratosis: Secondary | ICD-10-CM | POA: Diagnosis not present

## 2015-09-05 DIAGNOSIS — J411 Mucopurulent chronic bronchitis: Secondary | ICD-10-CM | POA: Diagnosis not present

## 2015-09-05 DIAGNOSIS — F4320 Adjustment disorder, unspecified: Secondary | ICD-10-CM | POA: Diagnosis not present

## 2015-09-15 DIAGNOSIS — Z950 Presence of cardiac pacemaker: Secondary | ICD-10-CM | POA: Diagnosis not present

## 2015-09-15 DIAGNOSIS — I4891 Unspecified atrial fibrillation: Secondary | ICD-10-CM | POA: Diagnosis not present

## 2015-09-15 DIAGNOSIS — I495 Sick sinus syndrome: Secondary | ICD-10-CM | POA: Diagnosis not present

## 2015-09-19 DIAGNOSIS — I4891 Unspecified atrial fibrillation: Secondary | ICD-10-CM | POA: Diagnosis not present

## 2015-09-22 DIAGNOSIS — I481 Persistent atrial fibrillation: Secondary | ICD-10-CM | POA: Diagnosis not present

## 2015-09-22 DIAGNOSIS — I10 Essential (primary) hypertension: Secondary | ICD-10-CM | POA: Diagnosis not present

## 2015-09-22 DIAGNOSIS — I5022 Chronic systolic (congestive) heart failure: Secondary | ICD-10-CM | POA: Diagnosis not present

## 2015-09-22 DIAGNOSIS — I429 Cardiomyopathy, unspecified: Secondary | ICD-10-CM | POA: Diagnosis not present

## 2015-09-25 DIAGNOSIS — T461X5A Adverse effect of calcium-channel blockers, initial encounter: Secondary | ICD-10-CM | POA: Diagnosis not present

## 2015-09-25 DIAGNOSIS — N952 Postmenopausal atrophic vaginitis: Secondary | ICD-10-CM | POA: Diagnosis not present

## 2015-09-25 DIAGNOSIS — I10 Essential (primary) hypertension: Secondary | ICD-10-CM | POA: Diagnosis not present

## 2015-09-25 DIAGNOSIS — N761 Subacute and chronic vaginitis: Secondary | ICD-10-CM | POA: Diagnosis not present

## 2015-09-25 DIAGNOSIS — I4891 Unspecified atrial fibrillation: Secondary | ICD-10-CM | POA: Diagnosis not present

## 2015-09-25 DIAGNOSIS — R42 Dizziness and giddiness: Secondary | ICD-10-CM | POA: Diagnosis not present

## 2015-09-25 DIAGNOSIS — M129 Arthropathy, unspecified: Secondary | ICD-10-CM | POA: Diagnosis not present

## 2015-10-03 DIAGNOSIS — F4320 Adjustment disorder, unspecified: Secondary | ICD-10-CM | POA: Diagnosis not present

## 2015-10-09 DIAGNOSIS — R531 Weakness: Secondary | ICD-10-CM | POA: Diagnosis not present

## 2015-10-09 DIAGNOSIS — M791 Myalgia: Secondary | ICD-10-CM | POA: Diagnosis not present

## 2015-10-09 DIAGNOSIS — R509 Fever, unspecified: Secondary | ICD-10-CM | POA: Diagnosis not present

## 2015-10-09 DIAGNOSIS — M26629 Arthralgia of temporomandibular joint, unspecified side: Secondary | ICD-10-CM | POA: Diagnosis not present

## 2015-10-09 DIAGNOSIS — R112 Nausea with vomiting, unspecified: Secondary | ICD-10-CM | POA: Diagnosis not present

## 2015-10-09 DIAGNOSIS — R630 Anorexia: Secondary | ICD-10-CM | POA: Diagnosis not present

## 2015-10-09 DIAGNOSIS — I4891 Unspecified atrial fibrillation: Secondary | ICD-10-CM | POA: Diagnosis not present

## 2015-10-13 DIAGNOSIS — M179 Osteoarthritis of knee, unspecified: Secondary | ICD-10-CM | POA: Diagnosis not present

## 2015-10-13 DIAGNOSIS — J449 Chronic obstructive pulmonary disease, unspecified: Secondary | ICD-10-CM | POA: Diagnosis not present

## 2015-10-13 DIAGNOSIS — F430 Acute stress reaction: Secondary | ICD-10-CM | POA: Diagnosis not present

## 2015-10-13 DIAGNOSIS — L0201 Cutaneous abscess of face: Secondary | ICD-10-CM | POA: Diagnosis not present

## 2015-10-13 DIAGNOSIS — Z9049 Acquired absence of other specified parts of digestive tract: Secondary | ICD-10-CM | POA: Diagnosis not present

## 2015-10-13 DIAGNOSIS — M11262 Other chondrocalcinosis, left knee: Secondary | ICD-10-CM | POA: Diagnosis not present

## 2015-10-13 DIAGNOSIS — J984 Other disorders of lung: Secondary | ICD-10-CM | POA: Diagnosis not present

## 2015-10-13 DIAGNOSIS — J329 Chronic sinusitis, unspecified: Secondary | ICD-10-CM | POA: Diagnosis not present

## 2015-10-13 DIAGNOSIS — Z9889 Other specified postprocedural states: Secondary | ICD-10-CM | POA: Diagnosis not present

## 2015-10-13 DIAGNOSIS — I517 Cardiomegaly: Secondary | ICD-10-CM | POA: Diagnosis not present

## 2015-10-13 DIAGNOSIS — Z90710 Acquired absence of both cervix and uterus: Secondary | ICD-10-CM | POA: Diagnosis not present

## 2015-10-13 DIAGNOSIS — R509 Fever, unspecified: Secondary | ICD-10-CM | POA: Diagnosis not present

## 2015-10-13 DIAGNOSIS — M1712 Unilateral primary osteoarthritis, left knee: Secondary | ICD-10-CM | POA: Diagnosis not present

## 2015-10-13 DIAGNOSIS — I10 Essential (primary) hypertension: Secondary | ICD-10-CM | POA: Diagnosis not present

## 2015-10-16 DIAGNOSIS — R630 Anorexia: Secondary | ICD-10-CM | POA: Diagnosis not present

## 2015-10-16 DIAGNOSIS — D72829 Elevated white blood cell count, unspecified: Secondary | ICD-10-CM | POA: Diagnosis not present

## 2015-10-16 DIAGNOSIS — M6281 Muscle weakness (generalized): Secondary | ICD-10-CM | POA: Diagnosis not present

## 2015-10-16 DIAGNOSIS — R112 Nausea with vomiting, unspecified: Secondary | ICD-10-CM | POA: Diagnosis not present

## 2015-10-16 DIAGNOSIS — F419 Anxiety disorder, unspecified: Secondary | ICD-10-CM | POA: Diagnosis not present

## 2015-10-16 DIAGNOSIS — F323 Major depressive disorder, single episode, severe with psychotic features: Secondary | ICD-10-CM | POA: Diagnosis not present

## 2015-10-16 DIAGNOSIS — S8002XA Contusion of left knee, initial encounter: Secondary | ICD-10-CM | POA: Diagnosis not present

## 2015-10-16 DIAGNOSIS — I4891 Unspecified atrial fibrillation: Secondary | ICD-10-CM | POA: Diagnosis not present

## 2015-10-18 DIAGNOSIS — I495 Sick sinus syndrome: Secondary | ICD-10-CM | POA: Diagnosis not present

## 2015-10-18 DIAGNOSIS — I10 Essential (primary) hypertension: Secondary | ICD-10-CM | POA: Diagnosis not present

## 2015-10-18 DIAGNOSIS — I481 Persistent atrial fibrillation: Secondary | ICD-10-CM | POA: Diagnosis not present

## 2015-10-18 DIAGNOSIS — I428 Other cardiomyopathies: Secondary | ICD-10-CM | POA: Diagnosis not present

## 2015-10-19 DIAGNOSIS — R42 Dizziness and giddiness: Secondary | ICD-10-CM | POA: Diagnosis not present

## 2015-10-19 DIAGNOSIS — F329 Major depressive disorder, single episode, unspecified: Secondary | ICD-10-CM | POA: Diagnosis not present

## 2015-10-19 DIAGNOSIS — M6281 Muscle weakness (generalized): Secondary | ICD-10-CM | POA: Diagnosis not present

## 2015-10-19 DIAGNOSIS — R5383 Other fatigue: Secondary | ICD-10-CM | POA: Diagnosis not present

## 2015-10-19 DIAGNOSIS — I4891 Unspecified atrial fibrillation: Secondary | ICD-10-CM | POA: Diagnosis not present

## 2015-10-19 DIAGNOSIS — M542 Cervicalgia: Secondary | ICD-10-CM | POA: Diagnosis not present

## 2015-10-19 DIAGNOSIS — M129 Arthropathy, unspecified: Secondary | ICD-10-CM | POA: Diagnosis not present

## 2015-10-19 DIAGNOSIS — F430 Acute stress reaction: Secondary | ICD-10-CM | POA: Diagnosis not present

## 2015-10-26 DIAGNOSIS — Z23 Encounter for immunization: Secondary | ICD-10-CM | POA: Diagnosis not present

## 2015-10-26 DIAGNOSIS — M542 Cervicalgia: Secondary | ICD-10-CM | POA: Diagnosis not present

## 2015-10-26 DIAGNOSIS — M1990 Unspecified osteoarthritis, unspecified site: Secondary | ICD-10-CM | POA: Diagnosis not present

## 2015-10-26 DIAGNOSIS — R5383 Other fatigue: Secondary | ICD-10-CM | POA: Diagnosis not present

## 2015-10-26 DIAGNOSIS — M6281 Muscle weakness (generalized): Secondary | ICD-10-CM | POA: Diagnosis not present

## 2015-10-26 DIAGNOSIS — R42 Dizziness and giddiness: Secondary | ICD-10-CM | POA: Diagnosis not present

## 2015-10-26 DIAGNOSIS — F419 Anxiety disorder, unspecified: Secondary | ICD-10-CM | POA: Diagnosis not present

## 2015-10-26 DIAGNOSIS — F329 Major depressive disorder, single episode, unspecified: Secondary | ICD-10-CM | POA: Diagnosis not present

## 2015-10-26 DIAGNOSIS — M25562 Pain in left knee: Secondary | ICD-10-CM | POA: Diagnosis not present

## 2015-10-31 DIAGNOSIS — F4320 Adjustment disorder, unspecified: Secondary | ICD-10-CM | POA: Diagnosis not present

## 2015-11-02 DIAGNOSIS — M545 Low back pain: Secondary | ICD-10-CM | POA: Diagnosis not present

## 2015-11-02 DIAGNOSIS — M25562 Pain in left knee: Secondary | ICD-10-CM | POA: Diagnosis not present

## 2015-11-02 DIAGNOSIS — F419 Anxiety disorder, unspecified: Secondary | ICD-10-CM | POA: Diagnosis not present

## 2015-11-02 DIAGNOSIS — F329 Major depressive disorder, single episode, unspecified: Secondary | ICD-10-CM | POA: Diagnosis not present

## 2015-11-03 DIAGNOSIS — M1712 Unilateral primary osteoarthritis, left knee: Secondary | ICD-10-CM | POA: Diagnosis not present

## 2015-11-03 DIAGNOSIS — M25562 Pain in left knee: Secondary | ICD-10-CM | POA: Diagnosis not present

## 2015-11-10 DIAGNOSIS — I4891 Unspecified atrial fibrillation: Secondary | ICD-10-CM | POA: Diagnosis not present

## 2015-12-04 IMAGING — CR DG CHEST 2V
2 series · 2 of 2 positions shown · non-contrast
Comparison: July 27, 2013

CLINICAL DATA: Dyspnea

EXAM:
CHEST  2 VIEW

[view not recorded (1 of 2)]
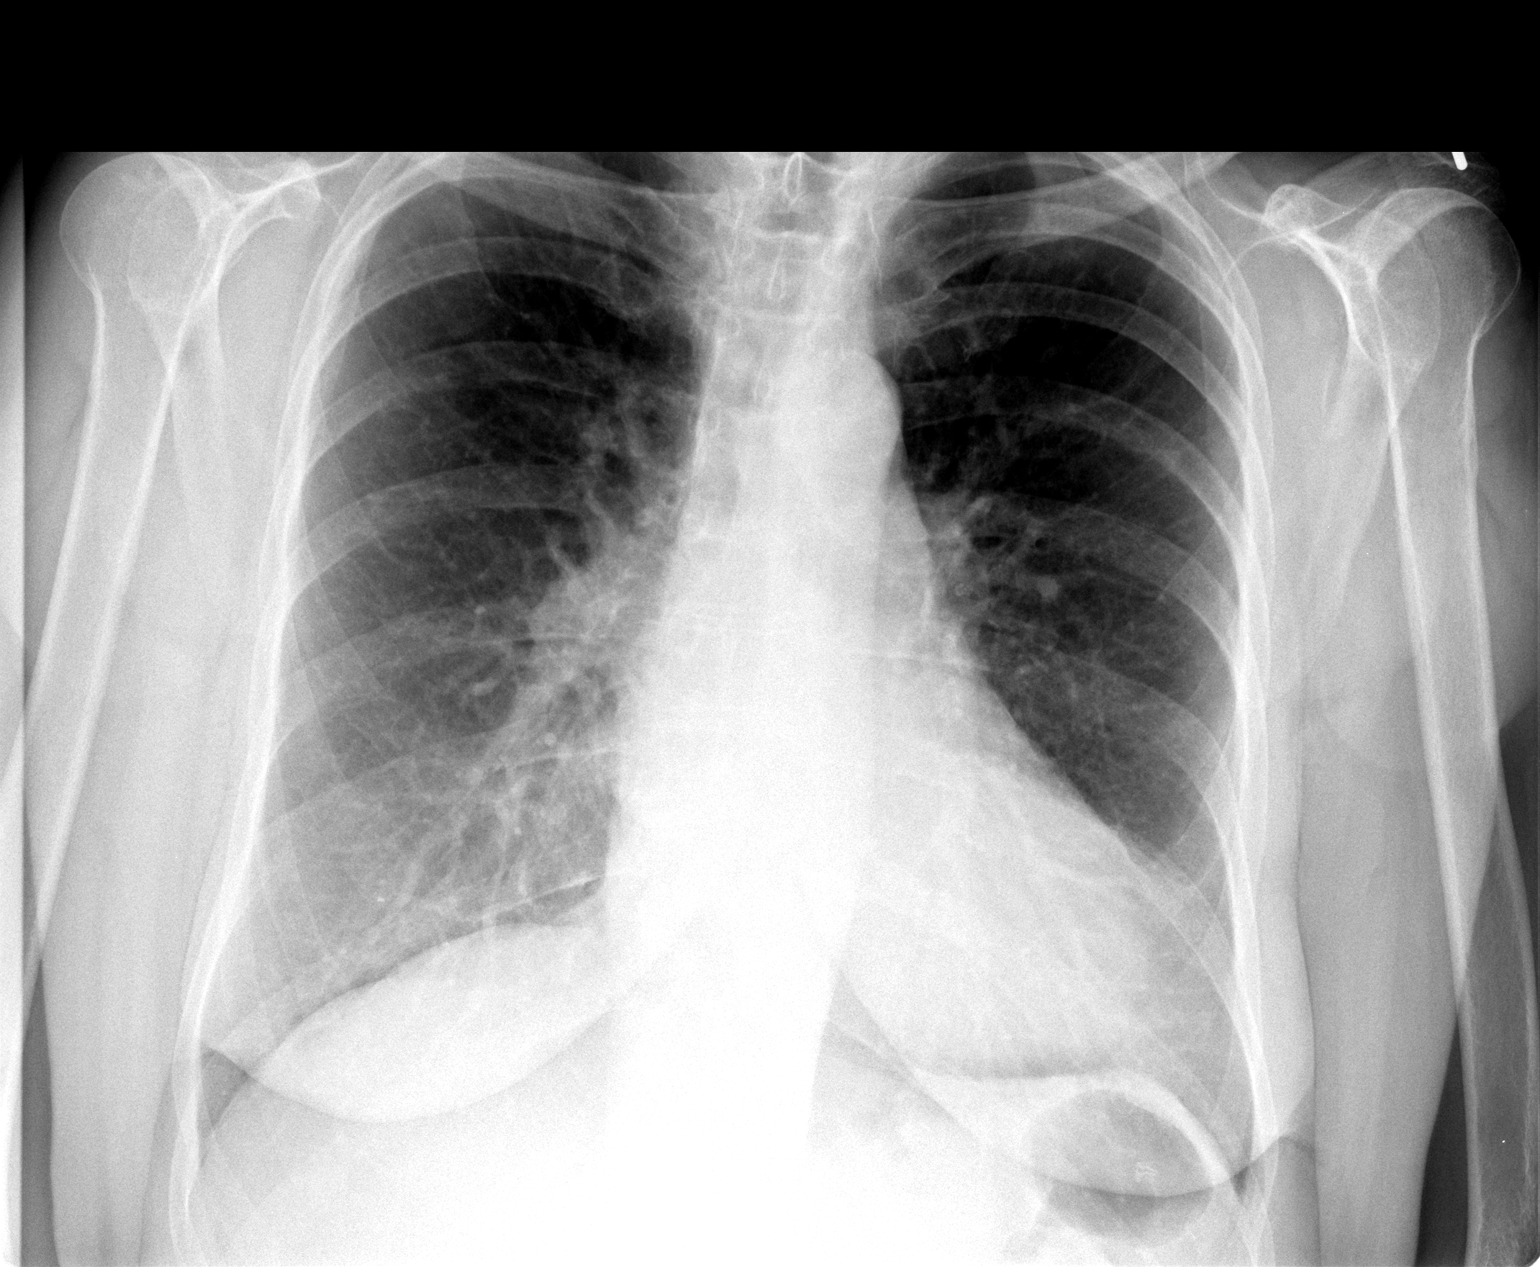

[view not recorded (2 of 2)]
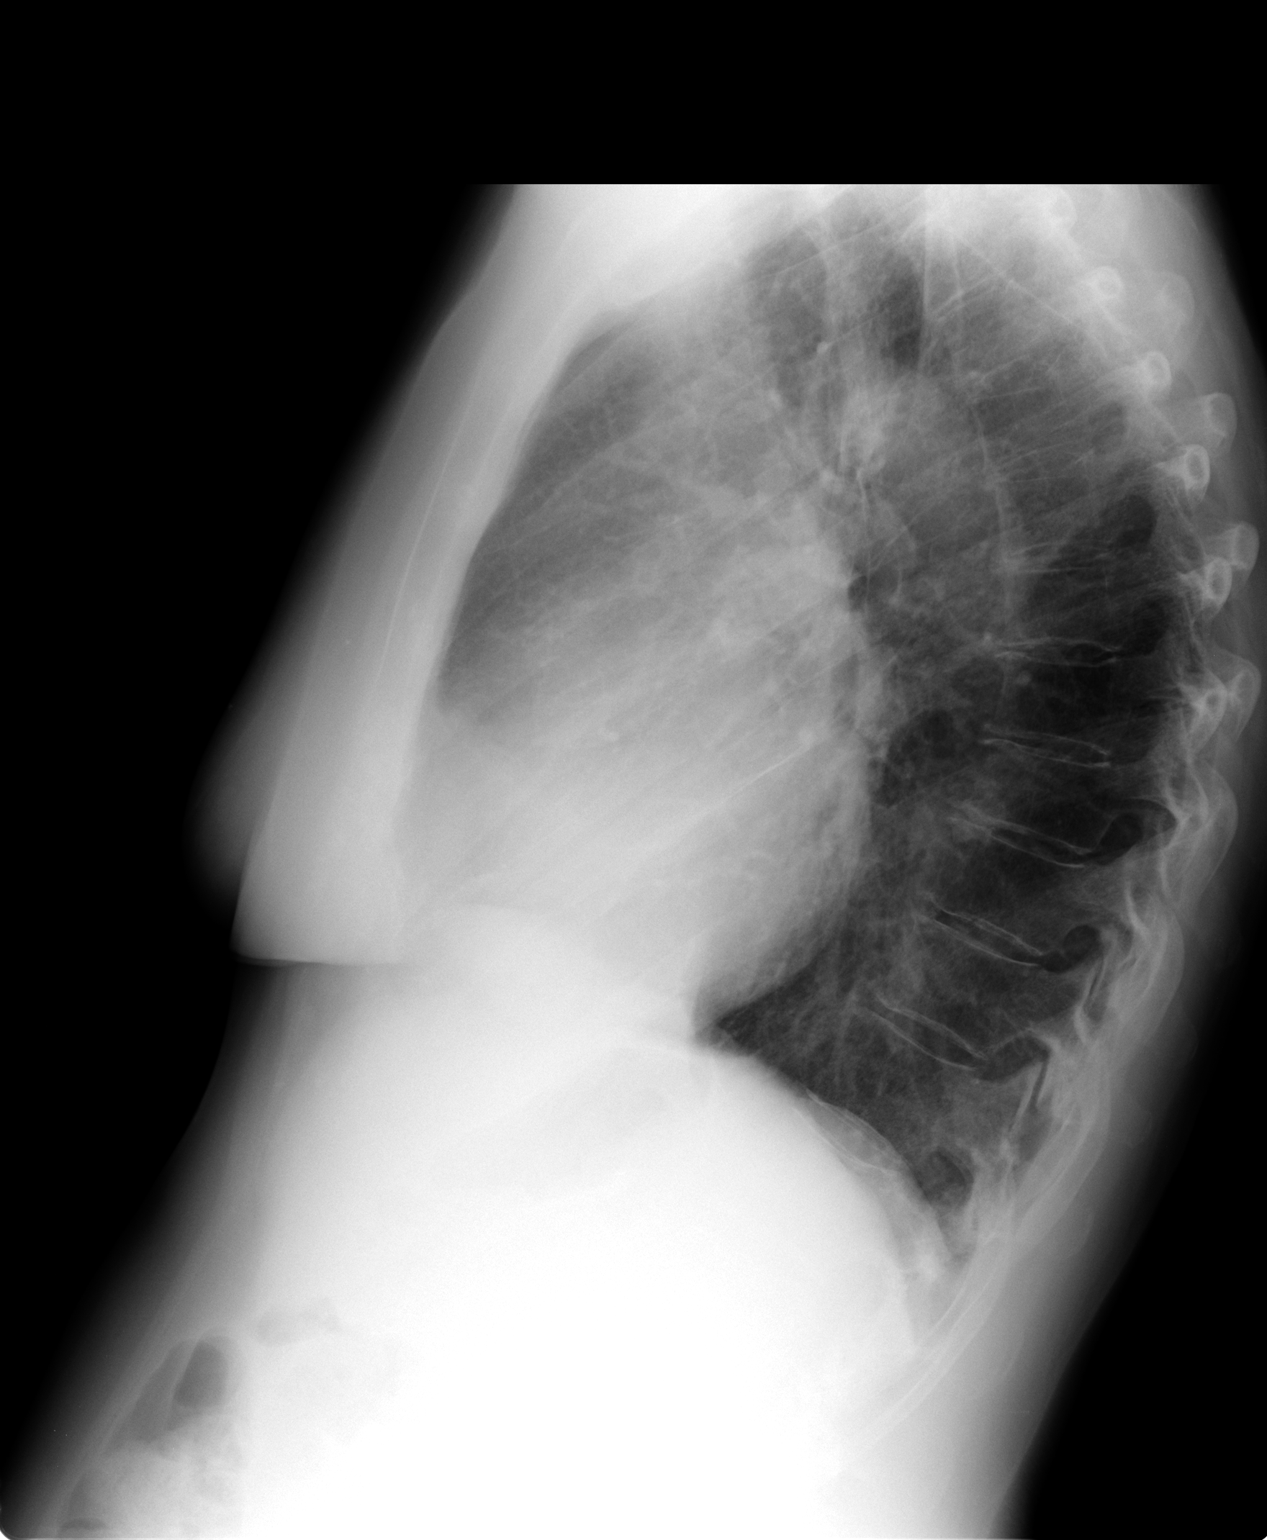

[2 of 2 positions shown; findings below may reference images not displayed]

FINDINGS: There is mild scarring in the left base. Elsewhere lungs are clear.
Heart is upper normal in size with pulmonary vascularity within
normal limits. No adenopathy. No bone lesions.
IMPRESSION: No edema or consolidation.  Slight scarring left base.

## 2015-12-05 DIAGNOSIS — F4320 Adjustment disorder, unspecified: Secondary | ICD-10-CM | POA: Diagnosis not present

## 2015-12-15 DIAGNOSIS — Z950 Presence of cardiac pacemaker: Secondary | ICD-10-CM | POA: Diagnosis not present

## 2015-12-15 DIAGNOSIS — I495 Sick sinus syndrome: Secondary | ICD-10-CM | POA: Diagnosis not present

## 2015-12-15 DIAGNOSIS — I4891 Unspecified atrial fibrillation: Secondary | ICD-10-CM | POA: Diagnosis not present

## 2015-12-18 ENCOUNTER — Encounter: Payer: Self-pay | Admitting: Family

## 2015-12-18 ENCOUNTER — Ambulatory Visit (INDEPENDENT_AMBULATORY_CARE_PROVIDER_SITE_OTHER): Payer: Medicare Other | Admitting: Family

## 2015-12-18 VITALS — BP 123/77 | HR 79 | Temp 98.1°F | Ht 64.75 in | Wt 137.6 lb

## 2015-12-18 DIAGNOSIS — I7300 Raynaud's syndrome without gangrene: Secondary | ICD-10-CM | POA: Diagnosis not present

## 2015-12-18 DIAGNOSIS — E785 Hyperlipidemia, unspecified: Secondary | ICD-10-CM

## 2015-12-18 DIAGNOSIS — I420 Dilated cardiomyopathy: Secondary | ICD-10-CM

## 2015-12-18 DIAGNOSIS — M1990 Unspecified osteoarthritis, unspecified site: Secondary | ICD-10-CM

## 2015-12-18 DIAGNOSIS — K219 Gastro-esophageal reflux disease without esophagitis: Secondary | ICD-10-CM | POA: Diagnosis not present

## 2015-12-18 DIAGNOSIS — Z889 Allergy status to unspecified drugs, medicaments and biological substances status: Secondary | ICD-10-CM

## 2015-12-18 DIAGNOSIS — Z8543 Personal history of malignant neoplasm of ovary: Secondary | ICD-10-CM

## 2015-12-18 DIAGNOSIS — I10 Essential (primary) hypertension: Secondary | ICD-10-CM

## 2015-12-18 DIAGNOSIS — M3500 Sicca syndrome, unspecified: Secondary | ICD-10-CM

## 2015-12-18 DIAGNOSIS — J45909 Unspecified asthma, uncomplicated: Secondary | ICD-10-CM | POA: Diagnosis not present

## 2015-12-18 DIAGNOSIS — D509 Iron deficiency anemia, unspecified: Secondary | ICD-10-CM | POA: Diagnosis not present

## 2015-12-18 DIAGNOSIS — J42 Unspecified chronic bronchitis: Secondary | ICD-10-CM

## 2015-12-18 DIAGNOSIS — R6889 Other general symptoms and signs: Secondary | ICD-10-CM | POA: Diagnosis not present

## 2015-12-18 DIAGNOSIS — Z7901 Long term (current) use of anticoagulants: Secondary | ICD-10-CM | POA: Diagnosis not present

## 2015-12-18 DIAGNOSIS — E039 Hypothyroidism, unspecified: Secondary | ICD-10-CM | POA: Insufficient documentation

## 2015-12-18 DIAGNOSIS — G4700 Insomnia, unspecified: Secondary | ICD-10-CM | POA: Insufficient documentation

## 2015-12-18 DIAGNOSIS — G2581 Restless legs syndrome: Secondary | ICD-10-CM | POA: Diagnosis not present

## 2015-12-18 LAB — COAGUCHEK XS/INR WAIVED
INR: 2.3 — ABNORMAL HIGH (ref 0.9–1.1)
Prothrombin Time: 27.5 s

## 2015-12-18 MED ORDER — MONTELUKAST SODIUM 10 MG PO TABS: 10.0000 mg | ORAL_TABLET | Freq: Every day | ORAL | 1 refills | Status: DC

## 2015-12-18 NOTE — Progress Notes (Signed)
Subjective:    Patient ID: Angela Wong, female    DOB: 12/07/1940, 75 y.o.   MRN: [id number]  Pt presents to the office today for chronic follow up.  Pt has just moved back to the area. PT is followed by Cardiologists for CHF and A Fib.   Hyperlipidemia  This is a chronic problem. The current episode started more than 1 year ago. The problem is controlled. Recent lipid tests were reviewed and are normal. She has no history of obesity. Pertinent negatives include no shortness of breath. Current antihyperlipidemic treatment includes diet change. The current treatment provides moderate improvement of lipids. Risk factors for coronary artery disease include dyslipidemia, hypertension, post-menopausal and a sedentary lifestyle.  Hypertension  This is a chronic problem. The current episode started more than 1 year ago. The problem has been resolved since onset. The problem is controlled. Associated symptoms include anxiety, malaise/fatigue and palpitations ("at times"). Pertinent negatives include no headaches, peripheral edema or shortness of breath. Risk factors for coronary artery disease include dyslipidemia, post-menopausal state, sedentary lifestyle and family history. Past treatments include alpha 1 blockers. The current treatment provides moderate improvement. Hypertensive end-organ damage includes a thyroid problem. There is no history of kidney disease, CAD/MI, CVA or heart failure. There is no history of sleep apnea.  Asthma  There is no cough, difficulty breathing, hemoptysis, shortness of breath or wheezing. This is a chronic problem. The current episode started more than 1 year ago. The problem occurs rarely. The problem has been unchanged. Associated symptoms include heartburn and malaise/fatigue. Pertinent negatives include no headaches, trouble swallowing or weight loss. Her symptoms are alleviated by rest and ipratropium. She reports moderate improvement on treatment. Her symptoms are  not alleviated by rest. Her past medical history is significant for asthma.  Gastroesophageal Reflux  She complains of heartburn. She reports no coughing, no nausea or no wheezing. This is a chronic problem. The current episode started more than 1 year ago. The problem occurs occasionally. The problem has been waxing and waning. Pertinent negatives include no weight loss. She has tried a histamine-2 antagonist for the symptoms. The treatment provided moderate relief.  Arthritis  Presents for follow-up visit. She complains of pain. The symptoms have been stable. Affected locations include the right hip, left knee and left hip (back). Her pain is at a severity of 7/10. Associated symptoms include pain at night and Raynaud's syndrome. Pertinent negatives include no weight loss.  Anemia  Presents for follow-up visit. Symptoms include bruises/bleeds easily, malaise/fatigue and palpitations ("at times"). There has been no weight loss. There is no history of heart failure.  Anxiety  Presents for follow-up visit. Symptoms include depressed mood, excessive worry, insomnia, irritability, nervous/anxious behavior and palpitations ("at times"). Patient reports no nausea or shortness of breath. Symptoms occur occasionally. The severity of symptoms is moderate.   Her past medical history is significant for anemia and asthma.  Thyroid Problem  Presents for follow-up visit. Symptoms include anxiety, depressed mood and palpitations ("at times"). Patient reports no leg swelling, visual change or weight loss. The symptoms have been stable. Her past medical history is significant for hyperlipidemia. There is no history of heart failure.  Insomnia  Primary symptoms: difficulty falling asleep, frequent awakening, malaise/fatigue.  The current episode started more than one month. The onset quality is gradual. The problem has been waxing and waning since onset.  A Fib PT currently taking warfarin.  Stable Sjogrens's/Raynauds PT currently taking prednisone 5 mg daily. Stable RLS  Takes valium as needed for this.   Review of Systems  Constitutional: Positive for irritability and malaise/fatigue. Negative for weight loss.  HENT: Negative for trouble swallowing.   Respiratory: Negative for cough, hemoptysis, shortness of breath and wheezing.   Cardiovascular: Positive for palpitations ("at times").  Gastrointestinal: Positive for heartburn. Negative for nausea.  Musculoskeletal: Positive for arthritis.  Neurological: Negative for headaches.  Hematological: Bruises/bleeds easily.  Psychiatric/Behavioral: The patient is nervous/anxious and has insomnia.        Objective:   Physical Exam  Constitutional: She is oriented to person, place, and time. She appears well-developed and well-nourished. No distress.  HENT:  Head: Normocephalic and atraumatic.  Nose: Nose normal.  Mouth/Throat: Oropharynx is clear and moist.  Eyes: Pupils are equal, round, and reactive to light.  Neck: Normal range of motion. Neck supple. No thyromegaly present.  Cardiovascular: Normal rate, regular rhythm, normal heart sounds and intact distal pulses.   No murmur heard. Pulmonary/Chest: Effort normal and breath sounds normal. No respiratory distress. She has no wheezes.  Abdominal: Soft. Bowel sounds are normal. She exhibits no distension. There is no tenderness.  Musculoskeletal: Normal range of motion. She exhibits no edema or tenderness.  Neurological: She is alert and oriented to person, place, and time.  Skin: Skin is warm and dry.  Psychiatric: She has a normal mood and affect. Her behavior is normal. Judgment and thought content normal.  Vitals reviewed.     BP 123/77   Pulse 79   Temp 98.1 F (36.7 C)   Ht 5' 4.75" (1.645 m)   Wt 137 lb 9.6 oz (62.4 kg)   BMI 23.07 kg/m      Assessment & Plan:  1. Long-term (current) use of anticoagulants - CoaguChek XS/INR Waived - CMP14+EGFR  2.  Benign essential HTN - CMP14+EGFR  3. Congestive dilated cardiomyopathy (HCC) - CMP14+EGFR  4. Uncomplicated asthma, unspecified asthma severity, unspecified whether persistent - CMP14+EGFR - montelukast (SINGULAIR) 10 MG tablet; Take 1 tablet (10 mg total) by mouth at bedtime.  Dispense: 90 tablet; Refill: 1  5. Chronic bronchitis, unspecified chronic bronchitis type ([place]) - CMP14+EGFR  6. Gastroesophageal reflux disease, esophagitis presence not specified - CMP14+EGFR  7. Osteoarthritis, unspecified osteoarthritis type, unspecified site - CMP14+EGFR  8. History of ovarian cancer - CMP14+EGFR  9. Hyperlipidemia, unspecified hyperlipidemia type - CMP14+EGFR - Lipid panel  10. Raynaud's disease without gangrene - CMP14+EGFR  11. Iron deficiency anemia, unspecified iron deficiency anemia type - CMP14+EGFR - Anemia Profile B  12. RLS (restless legs syndrome) - CMP14+EGFR  13. Multiple drug allergies - CMP14+EGFR  14. Sjogren's syndrome, with unspecified organ involvement ([place]) - CMP14+EGFR  15. Insomnia, unspecified type - CMP14+EGFR  16. Hypothyroidism, unspecified type - CMP14+EGFR - Thyroid Panel With TSH   Continue all meds, keep appt with Cardiologists  Labs pending Health Maintenance reviewed Diet and exercise encouraged RTO 4 months  [name], FNP

## 2015-12-18 NOTE — Patient Instructions (Signed)

## 2015-12-19 ENCOUNTER — Other Ambulatory Visit: Payer: Self-pay | Admitting: Family

## 2015-12-19 ENCOUNTER — Telehealth: Payer: Self-pay | Admitting: *Deleted

## 2015-12-19 LAB — ANEMIA PROFILE B
Basophils Absolute: 0 10*3/uL (ref 0.0–0.2)
Basos: 0 %
EOS (ABSOLUTE): 0 10*3/uL (ref 0.0–0.4)
Eos: 0 %
FERRITIN: 70 ng/mL (ref 15–150)
Folate: 19.8 ng/mL (ref >3.0)
Hematocrit: 41.6 % (ref 34.0–46.6)
Hemoglobin: 14 g/dL (ref 11.1–15.9)
IRON: 33 ug/dL (ref 27–139)
Immature Grans (Abs): 0 10*3/uL (ref 0.0–0.1)
Immature Granulocytes: 0 %
Iron Saturation: 12 % — ABNORMAL LOW (ref 15–55)
LYMPHS ABS: 1.6 10*3/uL (ref 0.7–3.1)
Lymphs: 18 %
MCH: 30.8 pg (ref 26.6–33.0)
MCHC: 33.7 g/dL (ref 31.5–35.7)
MCV: 92 fL (ref 79–97)
Monocytes Absolute: 0.6 10*3/uL (ref 0.1–0.9)
Monocytes: 7 %
NEUTROS ABS: 6.3 10*3/uL (ref 1.4–7.0)
Neutrophils: 75 %
PLATELETS: 222 10*3/uL (ref 150–379)
RBC: 4.54 x10E6/uL (ref 3.77–5.28)
RDW: 13.9 % (ref 12.3–15.4)
Retic Ct Pct: 1 % (ref 0.6–2.6)
Total Iron Binding Capacity: 267 ug/dL (ref 250–450)
UIBC: 234 ug/dL (ref 118–369)
VITAMIN B 12: 1198 pg/mL (ref 232–1245)
WBC: 8.4 10*3/uL (ref 3.4–10.8)

## 2015-12-19 LAB — LIPID PANEL
Chol/HDL Ratio: 2.5 ratio units (ref 0.0–4.4)
Cholesterol, Total: 224 mg/dL — ABNORMAL HIGH (ref 100–199)
HDL: 89 mg/dL (ref >39)
LDL Calculated: 107 mg/dL — ABNORMAL HIGH (ref 0–99)
Triglycerides: 138 mg/dL (ref 0–149)
VLDL CHOLESTEROL CAL: 28 mg/dL (ref 5–40)

## 2015-12-19 LAB — CMP14+EGFR
ALT: 14 IU/L (ref 0–32)
AST: 15 IU/L (ref 0–40)
Albumin/Globulin Ratio: 1.9 (ref 1.2–2.2)
Albumin: 4.1 g/dL (ref 3.5–4.8)
Alkaline Phosphatase: 73 IU/L (ref 39–117)
BUN/Creatinine Ratio: 26 (ref 12–28)
BUN: 20 mg/dL (ref 8–27)
Bilirubin Total: 0.3 mg/dL (ref 0.0–1.2)
CO2: 27 mmol/L (ref 18–29)
CREATININE: 0.78 mg/dL (ref 0.57–1.00)
Calcium: 9.2 mg/dL (ref 8.7–10.3)
Chloride: 95 mmol/L — ABNORMAL LOW (ref 96–106)
GFR calc non Af Amer: 74 mL/min/{1.73_m2} (ref >59)
GFR, EST AFRICAN AMERICAN: 85 mL/min/{1.73_m2} (ref >59)
GLUCOSE: 99 mg/dL (ref 65–99)
Globulin, Total: 2.2 g/dL (ref 1.5–4.5)
Potassium: 4.1 mmol/L (ref 3.5–5.2)
Sodium: 137 mmol/L (ref 134–144)
Total Protein: 6.3 g/dL (ref 6.0–8.5)

## 2015-12-19 LAB — THYROID PANEL WITH TSH
Free Thyroxine Index: 2.4 (ref 1.2–4.9)
T3 Uptake Ratio: 28 % (ref 24–39)
T4, Total: 8.5 ug/dL (ref 4.5–12.0)
TSH: 1.41 u[IU]/mL (ref 0.450–4.500)

## 2015-12-19 MED ORDER — AZITHROMYCIN 250 MG PO TABS: ORAL_TABLET | ORAL | 0 refills | Status: DC

## 2015-12-19 NOTE — Telephone Encounter (Signed)
[facility] called to inform pt is currently taking Warfarin Drug interaction with Warfarin and Zithromax Please advise

## 2015-12-21 NOTE — Telephone Encounter (Signed)
Pharmacy aware ok to dispense rx and pt is aware. Pt given appt with Tammy 01/01/16 at 9:15.

## 2015-12-21 NOTE — Telephone Encounter (Signed)
That is fine. Pt has long allergy list and she can tolerate this medication. She needs to schedule follow up with Tammy in next 1-2 weeks to have INR rechecked.

## 2016-01-01 ENCOUNTER — Encounter: Payer: Medicare Other | Admitting: Pharmacist

## 2016-01-16 ENCOUNTER — Ambulatory Visit (INDEPENDENT_AMBULATORY_CARE_PROVIDER_SITE_OTHER): Payer: Medicare Other | Admitting: Pharmacist

## 2016-01-16 ENCOUNTER — Encounter: Payer: Self-pay | Admitting: Pharmacist

## 2016-01-16 DIAGNOSIS — E213 Hyperparathyroidism, unspecified: Secondary | ICD-10-CM | POA: Insufficient documentation

## 2016-01-16 DIAGNOSIS — I482 Chronic atrial fibrillation, unspecified: Secondary | ICD-10-CM

## 2016-01-16 LAB — COAGUCHEK XS/INR WAIVED
INR: 2.5 — ABNORMAL HIGH (ref 0.9–1.1)
Prothrombin Time: 30.1 s

## 2016-01-16 MED ORDER — LOSARTAN POTASSIUM 100 MG PO TABS: 100.0000 mg | ORAL_TABLET | Freq: Every day | ORAL | 0 refills | Status: DC

## 2016-01-16 MED ORDER — TRAZODONE HCL 150 MG PO TABS: 150.0000 mg | ORAL_TABLET | Freq: Every day | ORAL | 0 refills | Status: DC

## 2016-01-16 MED ORDER — CYCLOSPORINE 0.05 % OP EMUL: 1.0000 [drp] | Freq: Two times a day (BID) | OPHTHALMIC | 2 refills | Status: DC

## 2016-01-16 MED ORDER — ESOMEPRAZOLE MAGNESIUM 40 MG PO CPDR: 40.0000 mg | DELAYED_RELEASE_CAPSULE | Freq: Two times a day (BID) | ORAL | 1 refills | Status: DC

## 2016-01-16 MED ORDER — WARFARIN SODIUM 1 MG PO TABS: ORAL_TABLET | ORAL | 0 refills | Status: DC

## 2016-01-16 NOTE — Progress Notes (Signed)
Subjective:     Indication: atrial fibrillation Bleeding signs/symptoms: None Thromboembolic signs/symptoms: None  Missed Coumadin doses: None Medication changes: no Dietary changes: no Bacterial/viral infection: no Other concerns: yes - patient wanted to review med list as the one that was printed at last visit contained several meds that she is no longer taking.  She also requested refills for losartan, warfarin, restasis and esomeprazole.  The following portions of the patient's history were reviewed and updated as appropriate: allergies, current medications, past family history, past medical history, past social history, past surgical history and problem list.   Objective:    INR Today: 2.5 Current dose: warfarin 1mg  - take 4 tablets daily except takes 2 tablets on tuesdays and thursdays.   Assessment:    Therapeutic INR for goal of 2-3   Medication management - needs refills.  UTD on visit with PCP High risk meds - patient has alprazolam, diazepam on her med list.  However she states that she does not take either regularly (reports that she has only taken 3-4 doses of either since 08/2015 and she does not take them at the same time)  Plan:    1. New dose: no change   2. Next INR: 1 month   3.  Refills for warfarin, esomeprazole, restasis and losartan sent to [facility] at patient request.  Also update medications list.  4.  Discussed precaution when using meds that can increase falls.  She is aware of risks and uses only on very rare occasion.   Patient ID: Angela Wong, female   DOB: 06/26/1940, 76 y.o.   MRN: [id number]

## 2016-01-17 DIAGNOSIS — Z7689 Persons encountering health services in other specified circumstances: Secondary | ICD-10-CM | POA: Diagnosis not present

## 2016-01-17 DIAGNOSIS — I428 Other cardiomyopathies: Secondary | ICD-10-CM | POA: Diagnosis not present

## 2016-01-17 DIAGNOSIS — I482 Chronic atrial fibrillation: Secondary | ICD-10-CM | POA: Diagnosis not present

## 2016-01-17 DIAGNOSIS — R9431 Abnormal electrocardiogram [ECG] [EKG]: Secondary | ICD-10-CM | POA: Diagnosis not present

## 2016-01-17 DIAGNOSIS — I4891 Unspecified atrial fibrillation: Secondary | ICD-10-CM | POA: Diagnosis not present

## 2016-01-19 ENCOUNTER — Other Ambulatory Visit: Payer: Self-pay | Admitting: *Deleted

## 2016-01-19 MED ORDER — CYCLOSPORINE 0.05 % OP EMUL: 1.0000 [drp] | Freq: Two times a day (BID) | OPHTHALMIC | 2 refills | Status: DC

## 2016-01-23 DIAGNOSIS — I5189 Other ill-defined heart diseases: Secondary | ICD-10-CM | POA: Diagnosis not present

## 2016-01-23 DIAGNOSIS — I081 Rheumatic disorders of both mitral and tricuspid valves: Secondary | ICD-10-CM | POA: Diagnosis not present

## 2016-01-23 DIAGNOSIS — I517 Cardiomegaly: Secondary | ICD-10-CM | POA: Diagnosis not present

## 2016-02-20 ENCOUNTER — Ambulatory Visit (INDEPENDENT_AMBULATORY_CARE_PROVIDER_SITE_OTHER): Payer: Medicare Other | Admitting: Pharmacist

## 2016-02-20 DIAGNOSIS — I482 Chronic atrial fibrillation, unspecified: Secondary | ICD-10-CM

## 2016-02-20 LAB — COAGUCHEK XS/INR WAIVED
INR: 2.6 — AB (ref 0.9–1.1)
Prothrombin Time: 30.7 s

## 2016-03-11 DIAGNOSIS — D235 Other benign neoplasm of skin of trunk: Secondary | ICD-10-CM | POA: Diagnosis not present

## 2016-03-11 DIAGNOSIS — D1801 Hemangioma of skin and subcutaneous tissue: Secondary | ICD-10-CM | POA: Diagnosis not present

## 2016-03-11 DIAGNOSIS — Z85828 Personal history of other malignant neoplasm of skin: Secondary | ICD-10-CM | POA: Diagnosis not present

## 2016-03-11 DIAGNOSIS — L570 Actinic keratosis: Secondary | ICD-10-CM | POA: Diagnosis not present

## 2016-03-11 DIAGNOSIS — D485 Neoplasm of uncertain behavior of skin: Secondary | ICD-10-CM | POA: Diagnosis not present

## 2016-03-11 DIAGNOSIS — L718 Other rosacea: Secondary | ICD-10-CM | POA: Diagnosis not present

## 2016-03-11 DIAGNOSIS — L218 Other seborrheic dermatitis: Secondary | ICD-10-CM | POA: Diagnosis not present

## 2016-03-11 DIAGNOSIS — L821 Other seborrheic keratosis: Secondary | ICD-10-CM | POA: Diagnosis not present

## 2016-03-15 DIAGNOSIS — I495 Sick sinus syndrome: Secondary | ICD-10-CM | POA: Diagnosis not present

## 2016-03-15 DIAGNOSIS — Z950 Presence of cardiac pacemaker: Secondary | ICD-10-CM | POA: Diagnosis not present

## 2016-03-15 DIAGNOSIS — I4891 Unspecified atrial fibrillation: Secondary | ICD-10-CM | POA: Diagnosis not present

## 2016-03-18 ENCOUNTER — Encounter: Payer: Self-pay | Admitting: Family

## 2016-03-18 ENCOUNTER — Ambulatory Visit (INDEPENDENT_AMBULATORY_CARE_PROVIDER_SITE_OTHER): Payer: Medicare Other | Admitting: Family

## 2016-03-18 VITALS — BP 126/78 | HR 76 | Temp 97.0°F | Ht 64.75 in | Wt 139.0 lb

## 2016-03-18 DIAGNOSIS — H109 Unspecified conjunctivitis: Secondary | ICD-10-CM

## 2016-03-18 MED ORDER — IRON POLYSACCH CMPLX-B12-FA 150-0.025-1 MG PO CAPS: 1.0000 | ORAL_CAPSULE | Freq: Every day | ORAL | 0 refills | Status: DC

## 2016-03-18 MED ORDER — BACITRACIN-POLYMYXIN B 500-10000 UNIT/GM OP OINT
1.0000 | TOPICAL_OINTMENT | Freq: Four times a day (QID) | OPHTHALMIC | 0 refills | Status: DC
Start: 2016-03-18 — End: 2016-03-22

## 2016-03-18 NOTE — Patient Instructions (Signed)
Bacterial Conjunctivitis Bacterial conjunctivitis is an infection of the clear membrane that covers the white part of your eye and the inner surface of your eyelid (conjunctiva). When the blood vessels in your conjunctiva become inflamed, your eye becomes red or pink, and it will probably feel itchy. Bacterial conjunctivitis spreads very easily from person to person (is contagious). It also spreads easily from one eye to the other eye. What are the causes? This condition is caused by several common bacteria. You may get the infection if you come into close contact with another person who is infected. You may also come into contact with items that are contaminated with the bacteria, such as a face towel, contact lens solution, or eye makeup. What increases the risk? This condition is more likely to develop in people who:  Are exposed to other people who have the infection.  Wear contact lenses.  Have a sinus infection.  Have had a recent eye injury or surgery.  Have a weak body defense system (immune system).  Have a medical condition that causes dry eyes.  What are the signs or symptoms? Symptoms of this condition include:  Eye redness.  Tearing or watery eyes.  Itchy eyes.  Burning feeling in your eyes.  Thick, yellowish discharge from an eye. This may turn into a crust on the eyelid overnight and cause your eyelids to stick together.  Swollen eyelids.  Blurred vision.  How is this diagnosed? Your health care provider can diagnose this condition based on your symptoms and medical history. Your health care provider may also take a sample of discharge from your eye to find the cause of your infection. This is rarely done. How is this treated? Treatment for this condition includes:  Antibiotic eye drops or ointment to clear the infection more quickly and prevent the spread of infection to others.  Oral antibiotic medicines to treat infections that do not respond to drops or  ointments, or last longer than 10 days.  Cool, wet cloths (cool compresses) placed on the eyes.  Artificial tears applied 2-6 times a day.  Follow these instructions at home: Medicines  Take or apply your antibiotic medicine as told by your health care provider. Do not stop taking or applying the antibiotic even if you start to feel better.  Take or apply over-the-counter and prescription medicines only as told by your health care provider.  Be very careful to avoid touching the edge of your eyelid with the eye drop bottle or the ointment tube when you apply medicines to the affected eye. This will keep you from spreading the infection to your other eye or to other people. Managing discomfort  Gently wipe away any drainage from your eye with a warm, wet washcloth or a cotton ball.  Apply a cool, clean washcloth to your eye for 10-20 minutes, 3-4 times a day. General instructions  Do not wear contact lenses until the inflammation is gone and your health care provider says it is safe to wear them again. Ask your health care provider how to sterilize or replace your contact lenses before you use them again. Wear glasses until you can resume wearing contacts.  Avoid wearing eye makeup until the inflammation is gone. Throw away any old eye cosmetics that may be contaminated.  Change or wash your pillowcase every day.  Do not share towels or washcloths. This may spread the infection.  Wash your hands often with soap and water. Use paper towels to dry your hands.  Avoid   touching or rubbing your eyes.  Do not drive or use heavy machinery if your vision is blurred. Contact a health care provider if:  You have a fever.  Your symptoms do not get better after 10 days. Get help right away if:  You have a fever and your symptoms suddenly get worse.  You have severe pain when you move your eye.  You have facial pain, redness, or swelling.  You have sudden loss of vision. This  information is not intended to replace advice given to you by your health care provider. Make sure you discuss any questions you have with your health care provider. Document Released: 12/31/2004 Document Revised: 05/11/2015 Document Reviewed: 10/13/2014 Elsevier Interactive Patient Education  2017 [employer]  

## 2016-03-18 NOTE — Addendum Note (Signed)
Addended by: [name] A on: 03/18/2016 04:52 PM   Modules accepted: Orders

## 2016-03-18 NOTE — Progress Notes (Signed)
   Subjective:    Patient ID: Angela Wong, female    DOB: 1940/03/27, 76 y.o.   MRN: [id number]  Conjunctivitis   The current episode started more than 1 week ago. The onset was sudden. The problem occurs continuously. The problem has been gradually worsening. The problem is moderate. The symptoms are relieved by one or more prescription drugs and one or more OTC medications. Associated symptoms include eye itching, photophobia, eye discharge, eye pain and eye redness. Pertinent negatives include no double vision, no congestion, no ear discharge and no sore throat. The eye pain is mild. The right eye is affected.      Review of Systems  HENT: Negative for congestion, ear discharge and sore throat.   Eyes: Positive for photophobia, pain, discharge, redness and itching. Negative for double vision.  All other systems reviewed and are negative.      Objective:   Physical Exam  Constitutional: She is oriented to person, place, and time. She appears well-developed and well-nourished. No distress.  HENT:  Head: Normocephalic and atraumatic.  Right Ear: External ear normal.  Left Ear: External ear normal.  Nose: Mucosal edema and rhinorrhea present. Right sinus exhibits maxillary sinus tenderness and frontal sinus tenderness. Left sinus exhibits maxillary sinus tenderness and frontal sinus tenderness.  Eyes: Pupils are equal, round, and reactive to light. Right eye exhibits discharge and exudate. Right conjunctiva has a hemorrhage.  Neck: Normal range of motion. Neck supple. No thyromegaly present.  Cardiovascular: Normal rate, regular rhythm, normal heart sounds and intact distal pulses.   No murmur heard. Pulmonary/Chest: Effort normal and breath sounds normal. No respiratory distress. She has no wheezes.  Abdominal: Soft. Bowel sounds are normal. She exhibits no distension. There is no tenderness.  Musculoskeletal: Normal range of motion. She exhibits no edema or tenderness.    Neurological: She is alert and oriented to person, place, and time. She has normal reflexes. No cranial nerve deficit.  Skin: Skin is warm and dry.  Psychiatric: She has a normal mood and affect. Her behavior is normal. Judgment and thought content normal.  Vitals reviewed.     BP 126/78   Pulse 76   Temp 97 F (36.1 C) (Oral)   Ht 5' 4.75" (1.645 m)   Wt 139 lb (63 kg)   BMI 23.31 kg/m      Assessment & Plan:  1. Bacterial conjunctivitis of right eye -Keep clean and dry - Cool compresses -Do not rub eye RTO prn  - bacitracin-polymyxin b (POLYSPORIN) ophthalmic ointment; Place 1 application into the right eye 4 (four) times daily. apply to eye every 12 hours  Dispense: 3.5 g; Refill: 0   [name], FNP

## 2016-03-19 ENCOUNTER — Telehealth: Payer: Self-pay | Admitting: Family

## 2016-03-19 NOTE — Telephone Encounter (Signed)
Patient aware to apply ointment ever 12 hours

## 2016-03-22 ENCOUNTER — Telehealth: Payer: Self-pay | Admitting: Family

## 2016-03-22 DIAGNOSIS — H109 Unspecified conjunctivitis: Secondary | ICD-10-CM

## 2016-03-22 MED ORDER — AZITHROMYCIN 250 MG PO TABS: ORAL_TABLET | ORAL | 0 refills | Status: DC

## 2016-03-22 MED ORDER — BACITRACIN-POLYMYXIN B 500-10000 UNIT/GM OP OINT: 1.0000 "application " | TOPICAL_OINTMENT | Freq: Four times a day (QID) | OPHTHALMIC | 0 refills | Status: DC

## 2016-03-22 NOTE — Telephone Encounter (Signed)
Patient aware that medications have been sent to pharmacy  

## 2016-03-22 NOTE — Telephone Encounter (Signed)
Patient was seen by you on 03/18/16 for pink eye in right eye.  Left eye is now red, irritated and matted.  Only has 1/2 bottle of ointment left, would like to know if you will send another bottle she can use for the left eye.  Also, still having sinus pressure, headache, sinus congestion and drainage.  Would like to know if you will call in [place], said she is sensitive to antibiotics and this is the only one she can tolerate.  [facility], please advise

## 2016-03-22 NOTE — Telephone Encounter (Signed)
Zpak and polytrim Prescription sent to pharmacy

## 2016-03-26 ENCOUNTER — Ambulatory Visit (INDEPENDENT_AMBULATORY_CARE_PROVIDER_SITE_OTHER): Payer: Medicare Other | Admitting: Pharmacist

## 2016-03-26 DIAGNOSIS — I482 Chronic atrial fibrillation, unspecified: Secondary | ICD-10-CM

## 2016-03-26 LAB — COAGUCHEK XS/INR WAIVED
INR: 2.5 — ABNORMAL HIGH (ref 0.9–1.1)
PROTHROMBIN TIME: 30.2 s

## 2016-04-04 ENCOUNTER — Other Ambulatory Visit: Payer: Self-pay | Admitting: Family

## 2016-04-15 ENCOUNTER — Other Ambulatory Visit: Payer: Self-pay | Admitting: Family

## 2016-04-18 ENCOUNTER — Ambulatory Visit (INDEPENDENT_AMBULATORY_CARE_PROVIDER_SITE_OTHER): Payer: Medicare Other | Admitting: Family

## 2016-04-18 ENCOUNTER — Encounter: Payer: Self-pay | Admitting: Family

## 2016-04-18 VITALS — BP 124/70 | HR 65 | Temp 97.4°F | Ht 64.75 in | Wt 137.2 lb

## 2016-04-18 DIAGNOSIS — I7300 Raynaud's syndrome without gangrene: Secondary | ICD-10-CM | POA: Diagnosis not present

## 2016-04-18 DIAGNOSIS — K219 Gastro-esophageal reflux disease without esophagitis: Secondary | ICD-10-CM

## 2016-04-18 DIAGNOSIS — E785 Hyperlipidemia, unspecified: Secondary | ICD-10-CM

## 2016-04-18 DIAGNOSIS — D509 Iron deficiency anemia, unspecified: Secondary | ICD-10-CM | POA: Diagnosis not present

## 2016-04-18 DIAGNOSIS — Z889 Allergy status to unspecified drugs, medicaments and biological substances status: Secondary | ICD-10-CM

## 2016-04-18 DIAGNOSIS — M1990 Unspecified osteoarthritis, unspecified site: Secondary | ICD-10-CM

## 2016-04-18 DIAGNOSIS — J45909 Unspecified asthma, uncomplicated: Secondary | ICD-10-CM | POA: Diagnosis not present

## 2016-04-18 DIAGNOSIS — I420 Dilated cardiomyopathy: Secondary | ICD-10-CM

## 2016-04-18 DIAGNOSIS — R5383 Other fatigue: Secondary | ICD-10-CM

## 2016-04-18 DIAGNOSIS — R6889 Other general symptoms and signs: Secondary | ICD-10-CM | POA: Diagnosis not present

## 2016-04-18 DIAGNOSIS — I482 Chronic atrial fibrillation, unspecified: Secondary | ICD-10-CM

## 2016-04-18 DIAGNOSIS — E039 Hypothyroidism, unspecified: Secondary | ICD-10-CM

## 2016-04-18 DIAGNOSIS — Z1211 Encounter for screening for malignant neoplasm of colon: Secondary | ICD-10-CM

## 2016-04-18 DIAGNOSIS — G2581 Restless legs syndrome: Secondary | ICD-10-CM | POA: Diagnosis not present

## 2016-04-18 DIAGNOSIS — J42 Unspecified chronic bronchitis: Secondary | ICD-10-CM | POA: Diagnosis not present

## 2016-04-18 DIAGNOSIS — I10 Essential (primary) hypertension: Secondary | ICD-10-CM

## 2016-04-18 DIAGNOSIS — M797 Fibromyalgia: Secondary | ICD-10-CM

## 2016-04-18 DIAGNOSIS — G4700 Insomnia, unspecified: Secondary | ICD-10-CM

## 2016-04-18 DIAGNOSIS — R3989 Other symptoms and signs involving the genitourinary system: Secondary | ICD-10-CM | POA: Diagnosis not present

## 2016-04-18 DIAGNOSIS — R5381 Other malaise: Secondary | ICD-10-CM

## 2016-04-18 DIAGNOSIS — M3500 Sicca syndrome, unspecified: Secondary | ICD-10-CM

## 2016-04-18 LAB — URINALYSIS, COMPLETE
Bilirubin, UA: NEGATIVE
GLUCOSE, UA: NEGATIVE
Ketones, UA: NEGATIVE
LEUKOCYTES UA: NEGATIVE
Nitrite, UA: NEGATIVE
Protein, UA: NEGATIVE
Specific Gravity, UA: 1.015 (ref 1.005–1.030)
Urobilinogen, Ur: 0.2 mg/dL (ref 0.2–1.0)
pH, UA: 7.5 (ref 5.0–7.5)

## 2016-04-18 LAB — COAGUCHEK XS/INR WAIVED
INR: 2.3 — ABNORMAL HIGH (ref 0.9–1.1)
PROTHROMBIN TIME: 28.1 s

## 2016-04-18 LAB — MICROSCOPIC EXAMINATION
Bacteria, UA: NONE SEEN
RENAL EPITHEL UA: NONE SEEN /HPF

## 2016-04-18 NOTE — Patient Instructions (Signed)

## 2016-04-18 NOTE — Progress Notes (Signed)
Subjective:    Patient ID: Angela Wong, female    DOB: [birth year], 76 y.o.   MRN: [id number]  Pt presents to the office today for chronic follow up.  PT is followed by Cardiologists for CHF and A Fib every 6 months.   Gastroesophageal Reflux  She complains of heartburn and wheezing. She reports no coughing or no nausea. This is a chronic problem. The current episode started more than 1 year ago. The problem occurs occasionally. The problem has been waxing and waning. Pertinent negatives include no weight loss. She has tried a histamine-2 antagonist for the symptoms. The treatment provided moderate relief.  Hypertension  This is a chronic problem. The current episode started more than 1 year ago. The problem has been resolved since onset. The problem is controlled. Associated symptoms include anxiety, malaise/fatigue, palpitations ("at times") and peripheral edema. Pertinent negatives include no headaches or shortness of breath. Risk factors for coronary artery disease include dyslipidemia, post-menopausal state, sedentary lifestyle and family history. Past treatments include alpha 1 blockers. The current treatment provides moderate improvement. Hypertensive end-organ damage includes heart failure. There is no history of kidney disease, CAD/MI or CVA. Identifiable causes of hypertension include a thyroid problem. There is no history of sleep apnea.  Hyperlipidemia  This is a chronic problem. The current episode started more than 1 year ago. The problem is controlled. Recent lipid tests were reviewed and are normal. She has no history of obesity. Pertinent negatives include no shortness of breath. Current antihyperlipidemic treatment includes diet change. The current treatment provides moderate improvement of lipids. Risk factors for coronary artery disease include dyslipidemia, hypertension, post-menopausal and a sedentary lifestyle.  Asthma  She complains of frequent throat clearing and wheezing.  There is no cough, difficulty breathing, hemoptysis or shortness of breath. This is a chronic problem. The current episode started more than 1 year ago. The problem occurs rarely. The problem has been unchanged. Associated symptoms include heartburn, malaise/fatigue and rhinorrhea. Pertinent negatives include no headaches, trouble swallowing or weight loss. Her symptoms are aggravated by lying down and pollen. Her symptoms are alleviated by rest and ipratropium. She reports moderate improvement on treatment. Her symptoms are not alleviated by rest. Her past medical history is significant for asthma.  Arthritis  Presents for follow-up visit. She complains of pain. The symptoms have been stable. Affected locations include the right hip, left knee and left hip (back). Her pain is at a severity of 7/10. Associated symptoms include pain at night and Raynaud's syndrome. Pertinent negatives include no weight loss.  Anemia  Presents for follow-up visit. Symptoms include bruises/bleeds easily, malaise/fatigue and palpitations ("at times"). There has been no weight loss. Past medical history includes heart failure.  Anxiety  Presents for follow-up visit. Symptoms include depressed mood, excessive worry, insomnia, irritability, nervous/anxious behavior and palpitations ("at times"). Patient reports no nausea or shortness of breath. Symptoms occur occasionally. The severity of symptoms is moderate.   Her past medical history is significant for anemia and asthma.  Thyroid Problem  Presents for follow-up visit. Symptoms include anxiety, depressed mood and palpitations ("at times"). Patient reports no leg swelling, visual change or weight loss. The symptoms have been stable. Her past medical history is significant for heart failure and hyperlipidemia.  Insomnia  Primary symptoms: difficulty falling asleep, frequent awakening, malaise/fatigue.  The current episode started more than one month. The onset quality is  gradual. The problem has been waxing and waning since onset.  Urinary Frequency   This  is a recurrent problem. The current episode started 1 to 4 weeks ago. The problem occurs intermittently. The problem has been waxing and waning. The quality of the pain is described as burning. The pain is mild. Associated symptoms include frequency and urgency. Pertinent negatives include no nausea. She has tried nothing for the symptoms. The treatment provided mild relief.  A Fib PT currently taking warfarin. Stable Sjogrens's/Raynauds/Fibromyalgia PT currently taking prednisone 5 mg daily. Stable. Increase fatigue and scared she will fall.  RLS Takes xanax as needed for this.   Review of Systems  Constitutional: Positive for irritability and malaise/fatigue. Negative for weight loss.  HENT: Positive for rhinorrhea. Negative for trouble swallowing.   Respiratory: Positive for wheezing. Negative for cough, hemoptysis and shortness of breath.   Cardiovascular: Positive for palpitations ("at times").  Gastrointestinal: Positive for heartburn. Negative for nausea.  Genitourinary: Positive for frequency and urgency.  Musculoskeletal: Positive for arthritis.  Neurological: Negative for headaches.  Hematological: Bruises/bleeds easily.  Psychiatric/Behavioral: The patient is nervous/anxious and has insomnia.        Objective:   Physical Exam  Constitutional: She is oriented to person, place, and time. She appears well-developed and well-nourished. No distress.  HENT:  Head: Normocephalic and atraumatic.  Nose: Nose normal.  Mouth/Throat: Oropharynx is clear and moist.  Eyes: Pupils are equal, round, and reactive to light.  Neck: Normal range of motion. Neck supple. No thyromegaly present.  Cardiovascular: Normal rate, regular rhythm, normal heart sounds and intact distal pulses.   No murmur heard. Pulmonary/Chest: Effort normal and breath sounds normal. No respiratory distress. She has no wheezes.    Abdominal: Soft. Bowel sounds are normal. She exhibits no distension. There is no tenderness.  Musculoskeletal: Normal range of motion. She exhibits no edema or tenderness.  Neurological: She is alert and oriented to person, place, and time.  Skin: Skin is warm and dry.  Psychiatric: She has a normal mood and affect. Her behavior is normal. Judgment and thought content normal.  Vitals reviewed.     BP 124/70   Pulse 65   Temp 97.4 F (36.3 C) (Oral)   Ht 5' 4.75" (1.645 m)   Wt 137 lb 3.2 oz (62.2 kg)   BMI 23.01 kg/m      Assessment & Plan:  1. Urine troubles - Urinalysis, Complete - CMP14+EGFR  2. Benign essential HTN - CMP14+EGFR  3. Chronic atrial fibrillation (HCC) - CoaguChek XS/INR Waived - CMP14+EGFR  4. Chronic bronchitis, unspecified chronic bronchitis type ([place]) - CMP14+EGFR  5. Uncomplicated asthma, unspecified asthma severity, unspecified whether persistent - CMP14+EGFR  6. Gastroesophageal reflux disease, esophagitis presence not specified - CMP14+EGFR  7. Hypothyroidism, unspecified type - CMP14+EGFR - Thyroid Panel With TSH  8. Osteoarthritis, unspecified osteoarthritis type, unspecified site - CMP14+EGFR - Ambulatory referral to Physical Therapy  9. Hyperlipidemia, unspecified hyperlipidemia type - CMP14+EGFR - Lipid panel  10. RLS (restless legs syndrome) - CMP14+EGFR  11. Raynaud's disease without gangrene - CMP14+EGFR  12. Congestive dilated cardiomyopathy (HCC) - CMP14+EGFR  13. Sjogren's syndrome, with unspecified organ involvement ([place])  - CMP14+EGFR  14. Malaise and fatigue - CMP14+EGFR - Ambulatory referral to Physical Therapy  15. Multiple drug allergies - CMP14+EGFR  16. Insomnia, unspecified type - CMP14+EGFR  17. Iron deficiency anemia, unspecified iron deficiency anemia type - Anemia Profile B - CMP14+EGFR  18. Fibromyalgia - Ambulatory referral to Physical Therapy  19. Colon cancer screening - Fecal  occult blood, imunochemical; Future   Continue all meds, keep  appt with Cardiologists  Labs pending Health Maintenance reviewed Diet and exercise encouraged RTO 4 months  [name], FNP

## 2016-04-19 ENCOUNTER — Telehealth: Payer: Self-pay | Admitting: Family

## 2016-04-19 LAB — CMP14+EGFR
ALT: 18 IU/L (ref 0–32)
AST: 19 IU/L (ref 0–40)
Albumin/Globulin Ratio: 1.8 (ref 1.2–2.2)
Albumin: 4.2 g/dL (ref 3.5–4.8)
Alkaline Phosphatase: 62 IU/L (ref 39–117)
BUN/Creatinine Ratio: 15 (ref 12–28)
BUN: 14 mg/dL (ref 8–27)
Bilirubin Total: 0.8 mg/dL (ref 0.0–1.2)
CALCIUM: 9.4 mg/dL (ref 8.7–10.3)
CO2: 29 mmol/L (ref 18–29)
Chloride: 98 mmol/L (ref 96–106)
Creatinine, Ser: 0.91 mg/dL (ref 0.57–1.00)
GFR, EST AFRICAN AMERICAN: 71 mL/min/{1.73_m2} (ref >59)
GFR, EST NON AFRICAN AMERICAN: 61 mL/min/{1.73_m2} (ref >59)
GLUCOSE: 94 mg/dL (ref 65–99)
Globulin, Total: 2.3 g/dL (ref 1.5–4.5)
Potassium: 3.8 mmol/L (ref 3.5–5.2)
Sodium: 142 mmol/L (ref 134–144)
TOTAL PROTEIN: 6.5 g/dL (ref 6.0–8.5)

## 2016-04-19 LAB — ANEMIA PROFILE B
BASOS: 0 %
Basophils Absolute: 0 10*3/uL (ref 0.0–0.2)
EOS (ABSOLUTE): 0 10*3/uL (ref 0.0–0.4)
Eos: 0 %
Ferritin: 46 ng/mL (ref 15–150)
Folate: >20 ng/mL (ref >3.0)
HEMATOCRIT: 42.8 % (ref 34.0–46.6)
Hemoglobin: 14.4 g/dL (ref 11.1–15.9)
IMMATURE GRANS (ABS): 0 10*3/uL (ref 0.0–0.1)
Immature Granulocytes: 0 %
Iron Saturation: 32 % (ref 15–55)
Iron: 91 ug/dL (ref 27–139)
Lymphocytes Absolute: 1.3 10*3/uL (ref 0.7–3.1)
Lymphs: 15 %
MCH: 31.3 pg (ref 26.6–33.0)
MCHC: 33.6 g/dL (ref 31.5–35.7)
MCV: 93 fL (ref 79–97)
MONOCYTES: 7 %
Monocytes Absolute: 0.6 10*3/uL (ref 0.1–0.9)
Neutrophils Absolute: 7 10*3/uL (ref 1.4–7.0)
Neutrophils: 78 %
Platelets: 193 10*3/uL (ref 150–379)
RBC: 4.6 x10E6/uL (ref 3.77–5.28)
RDW: 13.4 % (ref 12.3–15.4)
RETIC CT PCT: 1.2 % (ref 0.6–2.6)
TIBC: 286 ug/dL (ref 250–450)
UIBC: 195 ug/dL (ref 118–369)
Vitamin B-12: 1140 pg/mL (ref 232–1245)
WBC: 9 10*3/uL (ref 3.4–10.8)

## 2016-04-19 LAB — THYROID PANEL WITH TSH
FREE THYROXINE INDEX: 3.1 (ref 1.2–4.9)
T3 Uptake Ratio: 33 % (ref 24–39)
T4, Total: 9.4 ug/dL (ref 4.5–12.0)
TSH: 1.29 u[IU]/mL (ref 0.450–4.500)

## 2016-04-19 LAB — LIPID PANEL
Chol/HDL Ratio: 2.6 ratio (ref 0.0–4.4)
Cholesterol, Total: 212 mg/dL — ABNORMAL HIGH (ref 100–199)
HDL: 81 mg/dL (ref >39)
LDL Calculated: 108 mg/dL — ABNORMAL HIGH (ref 0–99)
TRIGLYCERIDES: 113 mg/dL (ref 0–149)
VLDL CHOLESTEROL CAL: 23 mg/dL (ref 5–40)

## 2016-04-22 ENCOUNTER — Other Ambulatory Visit: Payer: Medicare Other

## 2016-04-22 DIAGNOSIS — Z1211 Encounter for screening for malignant neoplasm of colon: Secondary | ICD-10-CM | POA: Diagnosis not present

## 2016-04-23 LAB — FECAL OCCULT BLOOD, IMMUNOCHEMICAL: FECAL OCCULT BLD: NEGATIVE

## 2016-04-24 DIAGNOSIS — Z45018 Encounter for adjustment and management of other part of cardiac pacemaker: Secondary | ICD-10-CM | POA: Diagnosis not present

## 2016-04-29 ENCOUNTER — Ambulatory Visit: Payer: Medicare Other | Attending: Family | Admitting: Physical Therapy

## 2016-04-29 DIAGNOSIS — M6281 Muscle weakness (generalized): Secondary | ICD-10-CM | POA: Insufficient documentation

## 2016-04-29 DIAGNOSIS — R293 Abnormal posture: Secondary | ICD-10-CM | POA: Diagnosis not present

## 2016-04-29 NOTE — Therapy (Signed)
[facility] [place], [place], [address] Phone: [phone]   Fax:  [phone]  Physical Therapy Evaluation  Patient Details  Name: Angela Wong MRN: [id number] Date of Birth: Jun 08, 1940 Referring Provider: [name]   Encounter Date: 04/29/2016      PT End of Session - 04/29/16 1008    Visit Number 1   Number of Visits 16   Date for PT Re-Evaluation 06/28/16   PT Start Time 0946      Past Medical History:  Diagnosis Date  . A-fib ([place])   . Asthma   . Cerebral vasculitis   . COPD (chronic obstructive pulmonary disease) ([place])   . Fibromyalgia   . GERD (gastroesophageal reflux disease)   . IBS (irritable bowel syndrome)   . MVP (mitral valve prolapse)   . Osteoarthritis   . Ovarian cancer ([place])    lymph node removal with hysterectomy  . Raynaud's disease   . RLS (restless legs syndrome)   . Sjogren's syndrome [facility])     Past Surgical History:  Procedure Laterality Date  . ABDOMINAL HYSTERECTOMY    . APPENDECTOMY    . BREAST SURGERY     Biopsy  . CATARACT EXTRACTION Left   . PACEMAKER INSERTION  09/15/2014    There were no vitals filed for this visit.       Subjective Assessment - 04/29/16 1031    Patient Stated Goals I want to walk for 30 minutes for exercise and not lose balance.  Improve posture.            [facility] PT Assessment - 04/29/16 0001      Assessment   Medical Diagnosis Malaise and fatigue.   Referring Provider [name]    Onset Date/Surgical Date --  Ongoing.     Precautions   Precautions Fall  PACEMAKER.     Restrictions   Weight Bearing Restrictions No     Balance Screen   Has the patient fallen in the past 6 months Yes   How many times? --  1.   Has the patient had a decrease in activity level because of a fear of falling?  Yes   Is the patient reluctant to leave their home because of a fear of falling?  Yes     [place] residence     Prior Function   Level of Independence Independent     Posture/Postural Control   Posture/Postural Control Postural limitations   Postural Limitations Rounded Shoulders;Forward head;Decreased lumbar lordosis;Increased thoracic kyphosis     ROM / Strength   AROM / PROM / Strength AROM;Strength     AROM   Overall AROM Comments WFL for bilateral U and LE's.  Crepitus with left knee ROM.     Strength   Overall Strength Comments Deferred to right shoulder due to RTC deficient.  Bilateral hip abdiction= 4-/5; left knee extension= 3+/5 limited in part due to pain..  Right knee 4-/5.  Bilateral ankle strength is normal.       Palpation   Palpation comment C/o bilateral hip and knee pain.     Special Tests    Special Tests --  (-) Romberg test.     Transfers   Five time sit to stand comments  --  Ind with use of arm rests.     Ambulation/Gait   Gait Pattern Decreased step length - right;Decreased step length - left;Decreased stride length;Trunk flexed   Gait  Comments Patient ambulates with a straight cane on right.                     [facility] Adult PT Treatment/Exercise - 04/29/16 0001      Exercises   Exercises Knee/Hip     Knee/Hip Exercises: Aerobic   Nustep Level 2 x 10 minutes with 02 at 97%.                  PT Short Term Goals - 04/29/16 1111      PT SHORT TERM GOAL #1   Title STG's=LTG's.           PT Long Term Goals - 04/29/16 1112      PT LONG TERM GOAL #1   Title Independent with HEP.   Time 8   Period Weeks   Status New     PT LONG TERM GOAL #2   Title Patient instructed in correct posture and able to verbalize.   Time 8   Period Weeks   Status New     PT LONG TERM GOAL #3   Title Bilateral lE strength 5/5 to increas stability for functional tasks.   Time 8   Period Weeks   Status New     PT LONG TERM GOAL #4   Title Patient walk 30 minutes for exercises.   Time 8   Period Weeks   Status New                Plan - 04/29/16 1016    Clinical Impression Statement The patient presents with evolving weakness.  She has a h/o a right RTC tear and bilateral knee arthritis.  Both her knees and hips are pain.  She has significant postural abnormalities as well.  She has diffuse major joint weakness impairing her functional mobility and ADL performance.  Patient will benefit from skilled physical therapy.   Rehab Potential Good   PT Frequency 2x / week   PT Duration 8 weeks   PT Treatment/Interventions ADLs/Self Care Home Management;Therapeutic activities;Therapeutic exercise;Balance training;Neuromuscular re-education;Patient/family education   PT Next Visit Plan Nustep; postural stretches; chin tucks; scapular strengthening; mini-crunches; hip bridges; core exercises; hip and PAIN-FREE quadriceps strengthening.  Balance and gait activites.   Consulted and Agree with Plan of Care Patient      Patient will benefit from skilled therapeutic intervention in order to improve the following deficits and impairments:  Pain, Decreased activity tolerance, Abnormal gait, Decreased strength, Postural dysfunction  Visit Diagnosis: Muscle weakness (generalized) - Plan: PT plan of care cert/re-cert  Abnormal posture - Plan: PT plan of care cert/re-cert      G-Codes - 53/97/67 1110    Functional Assessment Tool Used (Outpatient Only) Clinical judgement...   Functional Limitation Mobility: Walking and moving around   Mobility: Walking and Moving Around Current Status [phone]) At least 20 percent but less than 40 percent impaired, limited or restricted   Mobility: Walking and Moving Around Goal Status [phone]) At least 1 percent but less than 20 percent impaired, limited or restricted       Problem List Patient Active Problem List   Diagnosis Date Noted  . Hyperparathyroidism ([place]) 01/16/2016  . Iron deficiency anemia 12/18/2015  . Insomnia 12/18/2015  . Hypothyroidism 12/18/2015  . Rotator  cuff tear 03/23/2014  . Osteoarthritis   . Asthma   . Ovarian cancer ([place])   . COPD (chronic obstructive pulmonary disease) ([place])   . Fibromyalgia   . RLS (restless  legs syndrome)   . Cerebral vasculitis   . Sjogren's syndrome ([place])   . Raynaud's disease   . MVP (mitral valve prolapse)   . IBS (irritable bowel syndrome)   . Congestive dilated cardiomyopathy ([place]) 02/09/2014  . Dyspnea 12/13/2013  . Hyperlipidemia 11/26/2013  . Malaise and fatigue 11/26/2013  . Multiple drug allergies 11/02/2013  . History of ovarian cancer 10/01/2013  . GERD (gastroesophageal reflux disease) 10/01/2013  . Benign essential HTN 10/01/2013  . A-fib ([place]) 07/27/2013    APPLEGATE, [place] MPT 04/29/2016, 11:17 AM  [facility] [address] [place], [place], [address] Phone: [phone]   Fax:  [phone]  Name: ALAJIAH DUTKIEWICZ MRN: [id number] Date of Birth: 06/09/1940

## 2016-05-02 ENCOUNTER — Encounter: Payer: Self-pay | Admitting: Physical Therapy

## 2016-05-02 ENCOUNTER — Ambulatory Visit: Payer: Medicare Other | Admitting: Physical Therapy

## 2016-05-02 DIAGNOSIS — R293 Abnormal posture: Secondary | ICD-10-CM

## 2016-05-02 DIAGNOSIS — M6281 Muscle weakness (generalized): Secondary | ICD-10-CM | POA: Diagnosis not present

## 2016-05-02 NOTE — Therapy (Signed)
[facility] [place], [place], [address] Phone: [phone]   Fax:  [phone]  Physical Therapy Treatment  Patient Details  Name: Angela Wong MRN: [id number] Date of Birth: 14-Jul-1940 Referring Provider: [name]   Encounter Date: 05/02/2016      PT End of Session - 05/02/16 1104    Visit Number 2   Number of Visits 16   Date for PT Re-Evaluation 06/28/16   PT Start Time 3664   PT Stop Time 1111   PT Time Calculation (min) 42 min   Activity Tolerance Patient tolerated treatment well   Behavior During Therapy [facility] for tasks assessed/performed      Past Medical History:  Diagnosis Date  . A-fib ([place])   . Asthma   . Cerebral vasculitis   . COPD (chronic obstructive pulmonary disease) ([place])   . Fibromyalgia   . GERD (gastroesophageal reflux disease)   . IBS (irritable bowel syndrome)   . MVP (mitral valve prolapse)   . Osteoarthritis   . Ovarian cancer ([place])    lymph node removal with hysterectomy  . Raynaud's disease   . RLS (restless legs syndrome)   . Sjogren's syndrome [facility])     Past Surgical History:  Procedure Laterality Date  . ABDOMINAL HYSTERECTOMY    . APPENDECTOMY    . BREAST SURGERY     Biopsy  . CATARACT EXTRACTION Left   . PACEMAKER INSERTION  09/15/2014    There were no vitals filed for this visit.      Subjective Assessment - 05/02/16 1033    Subjective Patient did ok after last treatment   Patient Stated Goals I want to walk for 30 minutes for exercise and not lose balance.  Improve posture.   Currently in Pain? No/denies                         [facility] Adult PT Treatment/Exercise - 05/02/16 0001      Exercises   Exercises Shoulder;Lumbar;Knee/Hip     Lumbar Exercises: Supine   Ab Set 3 seconds;20 reps   Glut Set 3 seconds;20 reps   Bent Knee Raise 3 seconds  2x10   Bridge 3 seconds  2x10   Straight Leg Raise 3 seconds  2x10     Knee/Hip  Exercises: Aerobic   Nustep Level 2 x 10 minutes with 02 at 97%-98%     Knee/Hip Exercises: Seated   Long Arc Quad Strengthening;Both;2 sets;10 reps;Weights   Long Arc Quad Weight 2 lbs.     Knee/Hip Exercises: Supine   Hip Adduction Isometric Both;20 reps  with grey ball   Other Supine Knee/Hip Exercises hip abd with red t-band 3x10     Shoulder Exercises: Seated   Retraction Both;20 reps;Strengthening  posture focus                  PT Short Term Goals - 04/29/16 1111      PT SHORT TERM GOAL #1   Title STG's=LTG's.           PT Long Term Goals - 04/29/16 1112      PT LONG TERM GOAL #1   Title Independent with HEP.   Time 8   Period Weeks   Status New     PT LONG TERM GOAL #2   Title Patient instructed in correct posture and able to verbalize.   Time 8   Period Weeks   Status New  PT LONG TERM GOAL #3   Title Bilateral lE strength 5/5 to increas stability for functional tasks.   Time 8   Period Weeks   Status New     PT LONG TERM GOAL #4   Title Patient walk 30 minutes for exercises.   Time 8   Period Weeks   Status New               Plan - 05/02/16 1104    Clinical Impression Statement Patient tolerated treatment well today. Patient able to complete all exercises with minimal cues for technique. Patient reported no pain with supine or seated activities. Patient able to progress with strengthening. Patient goals ongoing due to strength, balance and activity tolerance limitations.   Rehab Potential Good   PT Frequency 2x / week   PT Duration 8 weeks   PT Treatment/Interventions ADLs/Self Care Home Management;Therapeutic activities;Therapeutic exercise;Balance training;Neuromuscular re-education;Patient/family education   PT Next Visit Plan cont woth POC per MPT for postural stretches; chin tucks; scapular strengthening: hip bridges; core exercises; hip and PAIN-FREE quadriceps strengthening.  Balance and gait activites.   Consulted  and Agree with Plan of Care Patient      Patient will benefit from skilled therapeutic intervention in order to improve the following deficits and impairments:  Pain, Decreased activity tolerance, Abnormal gait, Decreased strength, Postural dysfunction  Visit Diagnosis: Muscle weakness (generalized)  Abnormal posture     Problem List Patient Active Problem List   Diagnosis Date Noted  . Hyperparathyroidism ([place]) 01/16/2016  . Iron deficiency anemia 12/18/2015  . Insomnia 12/18/2015  . Hypothyroidism 12/18/2015  . Rotator cuff tear 03/23/2014  . Osteoarthritis   . Asthma   . Ovarian cancer ([place])   . COPD (chronic obstructive pulmonary disease) ([place])   . Fibromyalgia   . RLS (restless legs syndrome)   . Cerebral vasculitis   . Sjogren's syndrome ([place])   . Raynaud's disease   . MVP (mitral valve prolapse)   . IBS (irritable bowel syndrome)   . Congestive dilated cardiomyopathy ([place]) 02/09/2014  . Dyspnea 12/13/2013  . Hyperlipidemia 11/26/2013  . Malaise and fatigue 11/26/2013  . Multiple drug allergies 11/02/2013  . History of ovarian cancer 10/01/2013  . GERD (gastroesophageal reflux disease) 10/01/2013  . Benign essential HTN 10/01/2013  . A-fib ([place]) 07/27/2013    Dorianne Perret P, PTA 05/02/2016, 11:14 AM  [facility] [address], [place], [address] Phone: [phone]   Fax:  [phone]  Name: Angela Wong MRN: [id number] Date of Birth: 03-21-1940

## 2016-05-06 ENCOUNTER — Other Ambulatory Visit: Payer: Self-pay | Admitting: Family

## 2016-05-06 ENCOUNTER — Ambulatory Visit: Payer: Medicare Other | Admitting: Physical Therapy

## 2016-05-06 ENCOUNTER — Encounter: Payer: Self-pay | Admitting: Physical Therapy

## 2016-05-06 DIAGNOSIS — M6281 Muscle weakness (generalized): Secondary | ICD-10-CM

## 2016-05-06 DIAGNOSIS — R293 Abnormal posture: Secondary | ICD-10-CM | POA: Diagnosis not present

## 2016-05-06 NOTE — Therapy (Signed)
[facility] [place], [place], [address] Phone: [phone]   Fax:  [phone]  Physical Therapy Treatment  Patient Details  Name: Angela Wong MRN: [id number] Date of Birth: 03/18/1940 Referring Provider: [name]   Encounter Date: 05/06/2016      PT End of Session - 05/06/16 1012    Visit Number 3   Number of Visits 16   Date for PT Re-Evaluation 06/28/16   PT Start Time 0945   PT Stop Time 1026   PT Time Calculation (min) 41 min   Activity Tolerance Patient tolerated treatment well   Behavior During Therapy [facility] for tasks assessed/performed      Past Medical History:  Diagnosis Date  . A-fib ([place])   . Asthma   . Cerebral vasculitis   . COPD (chronic obstructive pulmonary disease) ([place])   . Fibromyalgia   . GERD (gastroesophageal reflux disease)   . IBS (irritable bowel syndrome)   . MVP (mitral valve prolapse)   . Osteoarthritis   . Ovarian cancer ([place])    lymph node removal with hysterectomy  . Raynaud's disease   . RLS (restless legs syndrome)   . Sjogren's syndrome [facility])     Past Surgical History:  Procedure Laterality Date  . ABDOMINAL HYSTERECTOMY    . APPENDECTOMY    . BREAST SURGERY     Biopsy  . CATARACT EXTRACTION Left   . PACEMAKER INSERTION  09/15/2014    There were no vitals filed for this visit.      Subjective Assessment - 05/06/16 0952    Subjective Patient reported doing well with mild soreness after last treatment. Patient went to store and had to lift a 17#bag of birdfood and 20# bag of dog food wich made her hurt after.   Patient Stated Goals I want to walk for 30 minutes for exercise and not lose balance.  Improve posture.   Currently in Pain? No/denies                         [facility] Adult PT Treatment/Exercise - 05/06/16 0001      Lumbar Exercises: Supine   Bent Knee Raise 3 seconds  2x20   Bridge 3 seconds  2x10   Straight Leg Raise 3  seconds  2x10     Knee/Hip Exercises: Aerobic   Nustep L3 x 43min UE/LE monitored for progression     Knee/Hip Exercises: Standing   Rocker Board 3 minutes   Other Standing Knee Exercises balance on airex x63min     Knee/Hip Exercises: Seated   Long Arc Quad Strengthening;Both;2 sets;10 reps;Weights   Long Arc Quad Weight 2 lbs.     Knee/Hip Exercises: Supine   Short Arc Quad Sets Strengthening;Both;3 sets;10 reps  2#   Hip Adduction Isometric Both;20 reps  grey ball   Other Supine Knee/Hip Exercises hip abd with red t-band 3x10     Shoulder Exercises: Seated   Retraction Both;15 reps;Theraband   Theraband Level (Shoulder Retraction) Level 1 (Yellow)                PT Education - 05/06/16 1028    Education provided Yes   Education Details HEP    Person(s) Educated Patient   Methods Explanation;Demonstration   Comprehension Verbalized understanding;Returned demonstration          PT Short Term Goals - 04/29/16 1111      PT SHORT TERM GOAL #1   Title  STG's=LTG's.           PT Long Term Goals - 05/06/16 0956      PT LONG TERM GOAL #1   Title Independent with HEP.   Time 8   Period Weeks   Status On-going     PT LONG TERM GOAL #2   Title Patient instructed in correct posture and able to verbalize.   Time 8   Period Weeks   Status On-going     PT LONG TERM GOAL #3   Title Bilateral lE strength 5/5 to increas stability for functional tasks.   Time 8   Period Weeks   Status On-going     PT LONG TERM GOAL #4   Title Patient walk 30 minutes for exercises.   Time 8   Period Weeks   Status On-going               Plan - 05/06/16 1024    Clinical Impression Statement Patient tolerated treatment well today and able to progress strengthening exercises with minimal rest breeaks. Patient able to tolerate balance exercises with no LOB. Patient given HEP for gentle strengthening. Patient goals ongoing due to strength deficts.    Rehab Potential  Good   PT Frequency 2x / week   PT Duration 8 weeks   PT Treatment/Interventions ADLs/Self Care Home Management;Therapeutic activities;Therapeutic exercise;Balance training;Neuromuscular re-education;Patient/family education   PT Next Visit Plan cont woth POC per MPT for postural stretches; chin tucks; scapular strengthening: hip bridges; core exercises; hip and PAIN-FREE quadriceps strengthening.  Balance and gait activites.   Consulted and Agree with Plan of Care Patient      Patient will benefit from skilled therapeutic intervention in order to improve the following deficits and impairments:  Pain, Decreased activity tolerance, Abnormal gait, Decreased strength, Postural dysfunction  Visit Diagnosis: Muscle weakness (generalized)  Abnormal posture     Problem List Patient Active Problem List   Diagnosis Date Noted  . Hyperparathyroidism ([place]) 01/16/2016  . Iron deficiency anemia 12/18/2015  . Insomnia 12/18/2015  . Hypothyroidism 12/18/2015  . Rotator cuff tear 03/23/2014  . Osteoarthritis   . Asthma   . Ovarian cancer ([place])   . COPD (chronic obstructive pulmonary disease) ([place])   . Fibromyalgia   . RLS (restless legs syndrome)   . Cerebral vasculitis   . Sjogren's syndrome ([place])   . Raynaud's disease   . MVP (mitral valve prolapse)   . IBS (irritable bowel syndrome)   . Congestive dilated cardiomyopathy ([place]) 02/09/2014  . Dyspnea 12/13/2013  . Hyperlipidemia 11/26/2013  . Malaise and fatigue 11/26/2013  . Multiple drug allergies 11/02/2013  . History of ovarian cancer 10/01/2013  . GERD (gastroesophageal reflux disease) 10/01/2013  . Benign essential HTN 10/01/2013  . A-fib ([place]) 07/27/2013    Caileb Rhue P, PTA 05/06/2016, 10:32 AM  [facility] [place], [place], [address] Phone: [phone]   Fax:  [phone]  Name: Angela Wong MRN: [id number] Date of Birth: 1940/09/05

## 2016-05-06 NOTE — Patient Instructions (Signed)
  Half Squat to Chair   Stand with feet shoulder width apart. Push buttocks backward and lower slowly, sitting in chair lightly and returning to standing position. Complete _2_ sets of 10_ repetitions. Perform __2-3_ sessions per day.  Toe-Up (Ankle Plantar Flexion and Dorsiflexion)   Holding a stable object, rise up on toes. Hold ____ seconds. Then rock back on heels and Hold 2____ seconds. Repeat _5-10___ times. Do _1-2___ sessions per day.   High Stepping  Bridging   Slowly raise buttocks from floor, keeping stomach tight. Repeat _10___ times per set. Do __2__ sets per session. Do __2__ sessions per day.

## 2016-05-08 ENCOUNTER — Encounter: Payer: Self-pay | Admitting: Physical Therapy

## 2016-05-08 ENCOUNTER — Ambulatory Visit: Payer: Medicare Other | Admitting: Physical Therapy

## 2016-05-08 DIAGNOSIS — R293 Abnormal posture: Secondary | ICD-10-CM | POA: Diagnosis not present

## 2016-05-08 DIAGNOSIS — M6281 Muscle weakness (generalized): Secondary | ICD-10-CM | POA: Diagnosis not present

## 2016-05-08 NOTE — Therapy (Signed)
[facility] [place], [place], [address] Phone: [phone]   Fax:  [phone]  Physical Therapy Treatment  Patient Details  Name: Angela Wong MRN: [id number] Date of Birth: September 26, 1940 Referring Provider: [name]   Encounter Date: 05/08/2016      PT End of Session - 05/08/16 1344    Visit Number 4   Number of Visits 16   Date for PT Re-Evaluation 06/28/16   PT Start Time 1316   PT Stop Time 1400   PT Time Calculation (min) 44 min   Activity Tolerance Patient tolerated treatment well   Behavior During Therapy [facility] for tasks assessed/performed      Past Medical History:  Diagnosis Date  . A-fib ([place])   . Asthma   . Cerebral vasculitis   . COPD (chronic obstructive pulmonary disease) ([place])   . Fibromyalgia   . GERD (gastroesophageal reflux disease)   . IBS (irritable bowel syndrome)   . MVP (mitral valve prolapse)   . Osteoarthritis   . Ovarian cancer ([place])    lymph node removal with hysterectomy  . Raynaud's disease   . RLS (restless legs syndrome)   . Sjogren's syndrome [facility])     Past Surgical History:  Procedure Laterality Date  . ABDOMINAL HYSTERECTOMY    . APPENDECTOMY    . BREAST SURGERY     Biopsy  . CATARACT EXTRACTION Left   . PACEMAKER INSERTION  09/15/2014    There were no vitals filed for this visit.      Subjective Assessment - 05/08/16 1322    Subjective Patient reported feeling very fatigue and sore after she dog tried to run out of house and she had to "tackle the 70# dog"   Patient Stated Goals I want to walk for 30 minutes for exercise and not lose balance.  Improve posture.   Currently in Pain? No/denies                         [facility] Adult PT Treatment/Exercise - 05/08/16 0001      Lumbar Exercises: Supine   Bridge 3 seconds  2x10   Straight Leg Raise 3 seconds  2x10     Knee/Hip Exercises: Aerobic   Nustep L3 x 9mn UE/LE monitored for  progression     Knee/Hip Exercises: Standing   Other Standing Knee Exercises sit to stand right LE focus to relieve left knee soreness x10     Knee/Hip Exercises: Seated   Long Arc Quad Strengthening;Both;2 sets;10 reps;Weights   Long Arc Quad Weight 2 lbs.   Hamstring Curl Strengthening;Both;2 sets;10 reps  red t-band     Knee/Hip Exercises: Supine   Short Arc Quad Sets Strengthening;Both;3 sets;10 reps  2#   Other Supine Knee/Hip Exercises seated ball [id number]   Other Supine Knee/Hip Exercises seated hip abd with red t-band x30     Shoulder Exercises: Seated   Retraction Both;Theraband;20 reps   Theraband Level (Shoulder Retraction) Level 1 (Yellow)                  PT Short Term Goals - 04/29/16 1111      PT SHORT TERM GOAL #1   Title STG's=LTG's.           PT Long Term Goals - 05/08/16 1350      PT LONG TERM GOAL #1   Title Independent with HEP.   Time 8   Period Weeks   Status  Achieved     PT LONG TERM GOAL #2   Title Patient instructed in correct posture and able to verbalize.   Time 8   Period Weeks   Status Achieved     PT LONG TERM GOAL #3   Title Bilateral lE strength 5/5 to increas stability for functional tasks.   Time 8   Period Weeks   Status On-going     PT LONG TERM GOAL #4   Title Patient walk 30 minutes for exercises.   Time 8   Period Weeks   Status On-going               Plan - 05/08/16 1355    Clinical Impression Statement Patient tolerated treatment well today. Today focused on seated and supine exercises due to patient tolerance today. Educated patient on techniques for ADL's to avoid soreness, posture techniques to protect back and rest when needed to avoid increased pain and fatigue. Patient doing HEP with no difficulty and understands posture. Patient met LTG#1 and #2 others ongoing due to strength deficts.    Rehab Potential Good   PT Frequency 2x / week   PT Duration 8 weeks   PT Treatment/Interventions  ADLs/Self Care Home Management;Therapeutic activities;Therapeutic exercise;Balance training;Neuromuscular re-education;Patient/family education   PT Next Visit Plan cont woth POC per MPT for postural stretches; chin tucks; scapular strengthening: hip bridges; core exercises; hip and PAIN-FREE quadriceps strengthening.  Balance and gait activites.   Consulted and Agree with Plan of Care Patient      Patient will benefit from skilled therapeutic intervention in order to improve the following deficits and impairments:  Pain, Decreased activity tolerance, Abnormal gait, Decreased strength, Postural dysfunction  Visit Diagnosis: Muscle weakness (generalized)  Abnormal posture     Problem List Patient Active Problem List   Diagnosis Date Noted  . Hyperparathyroidism ([place]) 01/16/2016  . Iron deficiency anemia 12/18/2015  . Insomnia 12/18/2015  . Hypothyroidism 12/18/2015  . Rotator cuff tear 03/23/2014  . Osteoarthritis   . Asthma   . Ovarian cancer ([place])   . COPD (chronic obstructive pulmonary disease) ([place])   . Fibromyalgia   . RLS (restless legs syndrome)   . Cerebral vasculitis   . Sjogren's syndrome ([place])   . Raynaud's disease   . MVP (mitral valve prolapse)   . IBS (irritable bowel syndrome)   . Congestive dilated cardiomyopathy ([place]) 02/09/2014  . Dyspnea 12/13/2013  . Hyperlipidemia 11/26/2013  . Malaise and fatigue 11/26/2013  . Multiple drug allergies 11/02/2013  . History of ovarian cancer 10/01/2013  . GERD (gastroesophageal reflux disease) 10/01/2013  . Benign essential HTN 10/01/2013  . A-fib ([place]) 07/27/2013    DUNFORD, CHRISTINA P, PTA 05/08/2016, 2:02 PM  [facility] [place], [place], [address] Phone: [phone]   Fax:  [phone]  Name: Angela Wong MRN: [id number] Date of Birth: [birth year]

## 2016-05-13 ENCOUNTER — Encounter: Payer: Self-pay | Admitting: Physical Therapy

## 2016-05-13 ENCOUNTER — Ambulatory Visit: Payer: Medicare Other | Admitting: Physical Therapy

## 2016-05-13 DIAGNOSIS — M6281 Muscle weakness (generalized): Secondary | ICD-10-CM

## 2016-05-13 DIAGNOSIS — R293 Abnormal posture: Secondary | ICD-10-CM | POA: Diagnosis not present

## 2016-05-13 NOTE — Therapy (Signed)
[facility] [place], [place], [address] Phone: [phone]   Fax:  [phone]  Physical Therapy Treatment  Patient Details  Name: Angela Wong MRN: [id number] Date of Birth: 31-Mar-1940 Referring Provider: [name]   Encounter Date: 05/13/2016      PT End of Session - 05/13/16 1007    Visit Number 5   Number of Visits 16   Date for PT Re-Evaluation 06/28/16   PT Start Time 0946   PT Stop Time 1029   PT Time Calculation (min) 43 min   Activity Tolerance Patient tolerated treatment well   Behavior During Therapy [facility] for tasks assessed/performed      Past Medical History:  Diagnosis Date  . A-fib ([place])   . Asthma   . Cerebral vasculitis   . COPD (chronic obstructive pulmonary disease) ([place])   . Fibromyalgia   . GERD (gastroesophageal reflux disease)   . IBS (irritable bowel syndrome)   . MVP (mitral valve prolapse)   . Osteoarthritis   . Ovarian cancer ([place])    lymph node removal with hysterectomy  . Raynaud's disease   . RLS (restless legs syndrome)   . Sjogren's syndrome [facility])     Past Surgical History:  Procedure Laterality Date  . ABDOMINAL HYSTERECTOMY    . APPENDECTOMY    . BREAST SURGERY     Biopsy  . CATARACT EXTRACTION Left   . PACEMAKER INSERTION  09/15/2014    There were no vitals filed for this visit.      Subjective Assessment - 05/13/16 0959    Subjective Patient tolerteated last week well and repoted some difficulty with bad posture with feeding dog. Dizzy spell over weekend after standing up too fast.   Patient Stated Goals I want to walk for 30 minutes for exercise and not lose balance.  Improve posture.   Currently in Pain? No/denies                         [facility] Adult PT Treatment/Exercise - 05/13/16 0001      Exercises   Exercises Neck     Neck Exercises: Seated   Cervical Rotation 5 reps;Both   Lateral Flexion Both;5 reps   Shoulder Rolls  Backwards;Forwards;5 reps   Other Seated Exercise chin tuck x10     Knee/Hip Exercises: Aerobic   Nustep L4 x 11 min UE/LE monitored for progression     Knee/Hip Exercises: Standing   Heel Raises Both;1 set;10 reps  uni UE   Hip ADduction Strengthening;Both;10 reps;1 set  uni UE   Rocker Board 3 minutes   Other Standing Knee Exercises balance on airex with feet together x54min   Other Standing Knee Exercises toe taps on 6" step 2x10, compensating on left LE     Shoulder Exercises: Standing   Other Standing Exercises standing wall angel  x5      Shoulder Exercises: Pulleys   Flexion 2 minutes;Other (comment)  with ball behind back for posture focus                  PT Short Term Goals - 04/29/16 1111      PT SHORT TERM GOAL #1   Title STG's=LTG's.           PT Long Term Goals - 05/08/16 1350      PT LONG TERM GOAL #1   Title Independent with HEP.   Time 8   Period Weeks  Status Achieved     PT LONG TERM GOAL #2   Title Patient instructed in correct posture and able to verbalize.   Time 8   Period Weeks   Status Achieved     PT LONG TERM GOAL #3   Title Bilateral lE strength 5/5 to increas stability for functional tasks.   Time 8   Period Weeks   Status On-going     PT LONG TERM GOAL #4   Title Patient walk 30 minutes for exercises.   Time 8   Period Weeks   Status On-going               Plan - 05/13/16 1010    Clinical Impression Statement Patient tolerated treatment well today. Patient able to perfom standing exercises with minimal verbal cues to correct compensations. Patient reported difficulty with walking due to looking down and forward posture. Patient also has difficulty with UE posture and reching to do hair.Educated patient on walking with FWW or using a shopping cart for upright position, safety and to increase activity tolerance. Patient unable to meet any further goals due to strength deficts.    Rehab Potential Good   PT  Frequency 2x / week   PT Duration 8 weeks   PT Treatment/Interventions ADLs/Self Care Home Management;Therapeutic activities;Therapeutic exercise;Balance training;Neuromuscular re-education;Patient/family education   PT Next Visit Plan cont woth POC per MPT for postural stretches; scapular strengthening: hip bridges; core exercises; hip and PAIN-FREE quadriceps strengthening.  Balance and gait activites.   PT Home Exercise Plan issue HEP for posture/shoulder and balance   Consulted and Agree with Plan of Care Patient      Patient will benefit from skilled therapeutic intervention in order to improve the following deficits and impairments:  Pain, Decreased activity tolerance, Abnormal gait, Decreased strength, Postural dysfunction  Visit Diagnosis: Muscle weakness (generalized)  Abnormal posture     Problem List Patient Active Problem List   Diagnosis Date Noted  . Hyperparathyroidism ([place]) 01/16/2016  . Iron deficiency anemia 12/18/2015  . Insomnia 12/18/2015  . Hypothyroidism 12/18/2015  . Rotator cuff tear 03/23/2014  . Osteoarthritis   . Asthma   . Ovarian cancer ([place])   . COPD (chronic obstructive pulmonary disease) ([place])   . Fibromyalgia   . RLS (restless legs syndrome)   . Cerebral vasculitis   . Sjogren's syndrome ([place])   . Raynaud's disease   . MVP (mitral valve prolapse)   . IBS (irritable bowel syndrome)   . Congestive dilated cardiomyopathy ([place]) 02/09/2014  . Dyspnea 12/13/2013  . Hyperlipidemia 11/26/2013  . Malaise and fatigue 11/26/2013  . Multiple drug allergies 11/02/2013  . History of ovarian cancer 10/01/2013  . GERD (gastroesophageal reflux disease) 10/01/2013  . Benign essential HTN 10/01/2013  . A-fib ([place]) 07/27/2013    Phillips Climes 05/13/2016, 10:32 AM  [facility] [place], [place], [address] Phone: [phone]   Fax:  [phone]  Name: Angela Wong MRN:  [id number] Date of Birth: 1940-01-31

## 2016-05-15 ENCOUNTER — Encounter: Payer: Self-pay | Admitting: Physical Therapy

## 2016-05-15 ENCOUNTER — Ambulatory Visit: Payer: Medicare Other | Attending: Family | Admitting: Physical Therapy

## 2016-05-15 DIAGNOSIS — M6281 Muscle weakness (generalized): Secondary | ICD-10-CM | POA: Insufficient documentation

## 2016-05-15 DIAGNOSIS — M25611 Stiffness of right shoulder, not elsewhere classified: Secondary | ICD-10-CM | POA: Diagnosis not present

## 2016-05-15 DIAGNOSIS — R293 Abnormal posture: Secondary | ICD-10-CM | POA: Insufficient documentation

## 2016-05-15 NOTE — Therapy (Signed)
[facility] [place], [place], [address] Phone: [phone]   Fax:  [phone]  Physical Therapy Treatment  Patient Details  Name: Angela Wong MRN: [id number] Date of Birth: [birth year] Referring Provider: [name]   Encounter Date: 05/15/2016      PT End of Session - 05/15/16 1355    Visit Number 6   Number of Visits 16   Date for PT Re-Evaluation 06/28/16   PT Start Time 2122   PT Stop Time 1358   PT Time Calculation (min) 43 min   Activity Tolerance Patient tolerated treatment well   Behavior During Therapy [facility] for tasks assessed/performed      Past Medical History:  Diagnosis Date  . A-fib ([place])   . Asthma   . Cerebral vasculitis   . COPD (chronic obstructive pulmonary disease) ([place])   . Fibromyalgia   . GERD (gastroesophageal reflux disease)   . IBS (irritable bowel syndrome)   . MVP (mitral valve prolapse)   . Osteoarthritis   . Ovarian cancer ([place])    lymph node removal with hysterectomy  . Raynaud's disease   . RLS (restless legs syndrome)   . Sjogren's syndrome [facility])     Past Surgical History:  Procedure Laterality Date  . ABDOMINAL HYSTERECTOMY    . APPENDECTOMY    . BREAST SURGERY     Biopsy  . CATARACT EXTRACTION Left   . PACEMAKER INSERTION  09/15/2014    There were no vitals filed for this visit.      Subjective Assessment - 05/15/16 1318    Subjective Patient reported no pain or soreness only fatigue today   Patient Stated Goals I want to walk for 30 minutes for exercise and not lose balance.  Improve posture.   Currently in Pain? No/denies                         [facility] Adult PT Treatment/Exercise - 05/15/16 0001      Lumbar Exercises: Supine   Clam 3 seconds;20 reps  with red t-band   Bridge 3 seconds  2x10     Knee/Hip Exercises: Aerobic   Nustep L4 x 15 min UE/LE monitored for progression     Knee/Hip Exercises: Standing   Rocker Board 3  minutes   Other Standing Knee Exercises balance on airex with cone stacking on shelf shoulder height   Other Standing Knee Exercises toe taps on 6" step 2x10     Knee/Hip Exercises: Seated   Long Arc Quad Strengthening;Both;2 sets;10 reps;Weights   Long Arc Quad Weight 3 lbs.     Knee/Hip Exercises: Supine   Short Arc Quad Sets Strengthening;Both;10 reps;2 sets  with 2# ball squeeze   Short Arc Quad Sets Limitations 3#     Shoulder Exercises: Standing   Other Standing Exercises standing wall angel  x5      Shoulder Exercises: Pulleys   Flexion 2 minutes;Other (comment)  with grey ball behind                PT Education - 05/15/16 1359    Education provided Yes   Education Details HEP   Person(s) Educated Patient   Methods Explanation;Demonstration;Handout   Comprehension Verbalized understanding;Returned demonstration          PT Short Term Goals - 04/29/16 1111      PT SHORT TERM GOAL #1   Title STG's=LTG's.  PT Long Term Goals - 05/08/16 1350      PT LONG TERM GOAL #1   Title Independent with HEP.   Time 8   Period Weeks   Status Achieved     PT LONG TERM GOAL #2   Title Patient instructed in correct posture and able to verbalize.   Time 8   Period Weeks   Status Achieved     PT LONG TERM GOAL #3   Title Bilateral lE strength 5/5 to increas stability for functional tasks.   Time 8   Period Weeks   Status On-going     PT LONG TERM GOAL #4   Title Patient walk 30 minutes for exercises.   Time 8   Period Weeks   Status On-going               Plan - 05/15/16 1402    Clinical Impression Statement Patient tolerated treatment well today. Patient able to progress with strengthening, posture and balance activities. Patient was given HEP to progress at home. Patient has ongoing forward flexed posture at times yet able to correct with cues. Patient goals ongoing due to strength deficts. Patient only walking up to 5 min currently    Rehab Potential Good   PT Frequency 2x / week   PT Duration 8 weeks   PT Treatment/Interventions ADLs/Self Care Home Management;Therapeutic activities;Therapeutic exercise;Balance training;Neuromuscular re-education;Patient/family education   PT Next Visit Plan cont woth POC per MPT for postural stretches; scapular strengthening: hip bridges; core exercises; hip and PAIN-FREE quadriceps strengthening.  Balance and gait activites.   Consulted and Agree with Plan of Care Patient      Patient will benefit from skilled therapeutic intervention in order to improve the following deficits and impairments:  Pain, Decreased activity tolerance, Abnormal gait, Decreased strength, Postural dysfunction  Visit Diagnosis: Muscle weakness (generalized)  Abnormal posture  Shoulder stiffness, right     Problem List Patient Active Problem List   Diagnosis Date Noted  . Hyperparathyroidism ([place]) 01/16/2016  . Iron deficiency anemia 12/18/2015  . Insomnia 12/18/2015  . Hypothyroidism 12/18/2015  . Rotator cuff tear 03/23/2014  . Osteoarthritis   . Asthma   . Ovarian cancer ([place])   . COPD (chronic obstructive pulmonary disease) ([place])   . Fibromyalgia   . RLS (restless legs syndrome)   . Cerebral vasculitis   . Sjogren's syndrome ([place])   . Raynaud's disease   . MVP (mitral valve prolapse)   . IBS (irritable bowel syndrome)   . Congestive dilated cardiomyopathy ([place]) 02/09/2014  . Dyspnea 12/13/2013  . Hyperlipidemia 11/26/2013  . Malaise and fatigue 11/26/2013  . Multiple drug allergies 11/02/2013  . History of ovarian cancer 10/01/2013  . GERD (gastroesophageal reflux disease) 10/01/2013  . Benign essential HTN 10/01/2013  . A-fib ([place]) 07/27/2013    Angela Wong, PTA 05/15/2016, 2:07 PM  [facility] [place], [place], [address] Phone: [phone]   Fax:  [phone]  Name: Angela Wong MRN: [id number] Date of  Birth: 1940-09-14

## 2016-05-15 NOTE — Patient Instructions (Signed)
Pelvic Tilt: Posterior - Legs Bent (Supine)  Tighten stomach and flatten back by rolling pelvis down and tighten buttock muscles. Hold _10___ seconds. Relax. Repeat _10-30___ times per set. Do __2__ sets per session. Do _2___ sessions per day.  Straight Leg Raise  Tighten stomach and slowly raise locked right leg __4__ inches from floor. Repeat __10-30__ times per set. Do __2__ sets per session. Do __2__ sessions per day.   Scapular Retraction: Bilateral  Facing anchor, pull arms back, bringing shoulder blades together. Repeat _30___ times per set. Do __2-3__ sets per session. Do _2___ sessions per day.

## 2016-05-20 ENCOUNTER — Ambulatory Visit: Payer: Medicare Other | Admitting: Physical Therapy

## 2016-05-20 ENCOUNTER — Encounter: Payer: Self-pay | Admitting: Physical Therapy

## 2016-05-20 DIAGNOSIS — M6281 Muscle weakness (generalized): Secondary | ICD-10-CM | POA: Diagnosis not present

## 2016-05-20 DIAGNOSIS — M25611 Stiffness of right shoulder, not elsewhere classified: Secondary | ICD-10-CM | POA: Diagnosis not present

## 2016-05-20 DIAGNOSIS — R293 Abnormal posture: Secondary | ICD-10-CM

## 2016-05-20 NOTE — Therapy (Signed)
[facility] [place], [place], [address] Phone: [phone]   Fax:  [phone]  Physical Therapy Treatment  Patient Details  Name: Angela Wong MRN: [id number] Date of Birth: 1940-08-27 Referring Provider: [name]   Encounter Date: 05/20/2016      PT End of Session - 05/20/16 1021    Visit Number 7   Number of Visits 16   Date for PT Re-Evaluation 06/28/16   PT Start Time 0944   PT Stop Time 1028   PT Time Calculation (min) 44 min   Activity Tolerance Patient tolerated treatment well   Behavior During Therapy [facility] for tasks assessed/performed      Past Medical History:  Diagnosis Date  . A-fib ([place])   . Asthma   . Cerebral vasculitis   . COPD (chronic obstructive pulmonary disease) ([place])   . Fibromyalgia   . GERD (gastroesophageal reflux disease)   . IBS (irritable bowel syndrome)   . MVP (mitral valve prolapse)   . Osteoarthritis   . Ovarian cancer ([place])    lymph node removal with hysterectomy  . Raynaud's disease   . RLS (restless legs syndrome)   . Sjogren's syndrome [facility])     Past Surgical History:  Procedure Laterality Date  . ABDOMINAL HYSTERECTOMY    . APPENDECTOMY    . BREAST SURGERY     Biopsy  . CATARACT EXTRACTION Left   . PACEMAKER INSERTION  09/15/2014    There were no vitals filed for this visit.      Subjective Assessment - 05/20/16 0950    Subjective Patient reported doing a lot over the weekend and was able to perorm outside yard work with overhead cutting tree limbs with improved ease and balance.    Patient Stated Goals I want to walk for 30 minutes for exercise and not lose balance.  Improve posture.   Currently in Pain? No/denies                         [facility] Adult PT Treatment/Exercise - 05/20/16 0001      Lumbar Exercises: Supine   Bridge 3 seconds  2x10 with red t-band abd     Knee/Hip Exercises: Aerobic   Nustep L4 x 15 min UE/LE monitored  for progression     Knee/Hip Exercises: Standing   Rocker Board 3 minutes   Other Standing Knee Exercises balance on airex with cone stacking on shelf shoulder height   Other Standing Knee Exercises toe taps on 8" step 2x10     Knee/Hip Exercises: Seated   Long Arc Quad Strengthening;Both;10 reps;Weights;3 sets   Long Arc Quad Weight 3 lbs.     Knee/Hip Exercises: Supine   Short Arc Quad Sets Strengthening;Both;10 reps;2 sets  with 2# ball squeeze   Short Arc Quad Sets Limitations 3     Shoulder Exercises: Pulleys   Flexion 3 minutes  with half foam behing back for posture                  PT Short Term Goals - 04/29/16 1111      PT SHORT TERM GOAL #1   Title STG's=LTG's.           PT Long Term Goals - 05/08/16 1350      PT LONG TERM GOAL #1   Title Independent with HEP.   Time 8   Period Weeks   Status Achieved     PT LONG  TERM GOAL #2   Title Patient instructed in correct posture and able to verbalize.   Time 8   Period Weeks   Status Achieved     PT LONG TERM GOAL #3   Title Bilateral lE strength 5/5 to increas stability for functional tasks.   Time 8   Period Weeks   Status On-going     PT LONG TERM GOAL #4   Title Patient walk 30 minutes for exercises.   Time 8   Period Weeks   Status On-going               Plan - 05/20/16 1022    Clinical Impression Statement Patient tolerated treatment well today. Patient able to progress with strengthening, activity tolerance and balance activities with greater ease. Patient has reported overall improvement and she is able to perform yard work with no LOB and safe. Goals progressing yet ongoing due strength deficts.    Rehab Potential Good   PT Frequency 2x / week   PT Duration 8 weeks   PT Treatment/Interventions ADLs/Self Care Home Management;Therapeutic activities;Therapeutic exercise;Balance training;Neuromuscular re-education;Patient/family education   PT Next Visit Plan cont woth POC per  MPT for postural stretches; scapular strengthening: hip bridges; core exercises; hip and PAIN-FREE quadriceps strengthening.  Balance and gait activites.   Consulted and Agree with Plan of Care Patient      Patient will benefit from skilled therapeutic intervention in order to improve the following deficits and impairments:  Pain, Decreased activity tolerance, Abnormal gait, Decreased strength, Postural dysfunction  Visit Diagnosis: Muscle weakness (generalized)  Abnormal posture     Problem List Patient Active Problem List   Diagnosis Date Noted  . Hyperparathyroidism ([place]) 01/16/2016  . Iron deficiency anemia 12/18/2015  . Insomnia 12/18/2015  . Hypothyroidism 12/18/2015  . Rotator cuff tear 03/23/2014  . Osteoarthritis   . Asthma   . Ovarian cancer ([place])   . COPD (chronic obstructive pulmonary disease) ([place])   . Fibromyalgia   . RLS (restless legs syndrome)   . Cerebral vasculitis   . Sjogren's syndrome ([place])   . Raynaud's disease   . MVP (mitral valve prolapse)   . IBS (irritable bowel syndrome)   . Congestive dilated cardiomyopathy ([place]) 02/09/2014  . Dyspnea 12/13/2013  . Hyperlipidemia 11/26/2013  . Malaise and fatigue 11/26/2013  . Multiple drug allergies 11/02/2013  . History of ovarian cancer 10/01/2013  . GERD (gastroesophageal reflux disease) 10/01/2013  . Benign essential HTN 10/01/2013  . A-fib ([place]) 07/27/2013    Airyana Sprunger P, PTA 05/20/2016, 10:31 AM  [facility] [place], [place], [address] Phone: [phone]   Fax:  [phone]  Name: Angela Wong MRN: [id number] Date of Birth: [birth year]

## 2016-05-22 ENCOUNTER — Ambulatory Visit: Payer: Medicare Other | Admitting: Physical Therapy

## 2016-05-22 ENCOUNTER — Encounter: Payer: Self-pay | Admitting: Physical Therapy

## 2016-05-22 DIAGNOSIS — M6281 Muscle weakness (generalized): Secondary | ICD-10-CM | POA: Diagnosis not present

## 2016-05-22 DIAGNOSIS — M25611 Stiffness of right shoulder, not elsewhere classified: Secondary | ICD-10-CM | POA: Diagnosis not present

## 2016-05-22 DIAGNOSIS — R293 Abnormal posture: Secondary | ICD-10-CM

## 2016-05-22 NOTE — Therapy (Signed)
[facility] [place], [place], [address] Phone: [phone]   Fax:  [phone]  Physical Therapy Treatment  Patient Details  Name: Angela Wong MRN: [id number] Date of Birth: 10/09/1940 Referring Provider: [name]   Encounter Date: 05/22/2016      PT End of Session - 05/22/16 1351    Visit Number 8   Number of Visits 16   Date for PT Re-Evaluation 06/28/16   PT Start Time 1314   PT Stop Time 1354   PT Time Calculation (min) 40 min   Activity Tolerance Patient tolerated treatment well   Behavior During Therapy [facility] for tasks assessed/performed      Past Medical History:  Diagnosis Date  . A-fib ([place])   . Asthma   . Cerebral vasculitis   . COPD (chronic obstructive pulmonary disease) ([place])   . Fibromyalgia   . GERD (gastroesophageal reflux disease)   . IBS (irritable bowel syndrome)   . MVP (mitral valve prolapse)   . Osteoarthritis   . Ovarian cancer ([place])    lymph node removal with hysterectomy  . Raynaud's disease   . RLS (restless legs syndrome)   . Sjogren's syndrome [facility])     Past Surgical History:  Procedure Laterality Date  . ABDOMINAL HYSTERECTOMY    . APPENDECTOMY    . BREAST SURGERY     Biopsy  . CATARACT EXTRACTION Left   . PACEMAKER INSERTION  09/15/2014    There were no vitals filed for this visit.      Subjective Assessment - 05/22/16 1323    Subjective Patient reported doing well after last treatment, fatigue today due to not sleeping well   Patient Stated Goals I want to walk for 30 minutes for exercise and not lose balance.  Improve posture.   Currently in Pain? No/denies                         [facility] Adult PT Treatment/Exercise - 05/22/16 0001      Neck Exercises: Seated   W Back Limitations x10 shoulder difficulty   Other Seated Exercise horizontal abd with yellow t-band2x10   Other Seated Exercise scap retractions x20     Lumbar Exercises: Supine    Bridge 3 seconds  2x10 with red tband clams     Knee/Hip Exercises: Aerobic   Nustep L4-5 x 15 min UE/LE monitored for progression     Knee/Hip Exercises: Seated   Long Arc Quad Strengthening;Both;10 reps;Weights;3 sets   [employer] Weight 3 lbs.     Knee/Hip Exercises: Supine   Short Arc Quad Sets Strengthening;Both;10 reps;2 sets  2# ball squuze   Short Arc Quad Sets Limitations 3                  PT Short Term Goals - 04/29/16 1111      PT SHORT TERM GOAL #1   Title STG's=LTG's.           PT Long Term Goals - 05/08/16 1350      PT LONG TERM GOAL #1   Title Independent with HEP.   Time 8   Period Weeks   Status Achieved     PT LONG TERM GOAL #2   Title Patient instructed in correct posture and able to verbalize.   Time 8   Period Weeks   Status Achieved     PT LONG TERM GOAL #3   Title Bilateral lE strength  5/5 to increas stability for functional tasks.   Time 8   Period Weeks   Status On-going     PT LONG TERM GOAL #4   Title Patient walk 30 minutes for exercises.   Time 8   Period Weeks   Status On-going               Plan - 05/22/16 1354    Clinical Impression Statement Patient tolerated treatment well today yet some fatigue overall. Patient able to progress strengthening slowly with some difficulty reported. Patient continues to have forward posture and was educated on scap retractions throughout day to hel with upright posture. Goals ongoing due to strength deficts.    Rehab Potential Good   PT Frequency 2x / week   PT Duration 8 weeks   PT Treatment/Interventions ADLs/Self Care Home Management;Therapeutic activities;Therapeutic exercise;Balance training;Neuromuscular re-education;Patient/family education   PT Next Visit Plan cont woth POC per MPT for postural stretches; scapular strengthening: hip bridges; core exercises; hip and PAIN-FREE quadriceps strengthening.  Balance and gait activites.   Consulted and Agree with Plan  of Care Patient      Patient will benefit from skilled therapeutic intervention in order to improve the following deficits and impairments:  Pain, Decreased activity tolerance, Abnormal gait, Decreased strength, Postural dysfunction  Visit Diagnosis: Muscle weakness (generalized)  Abnormal posture     Problem List Patient Active Problem List   Diagnosis Date Noted  . Hyperparathyroidism ([place]) 01/16/2016  . Iron deficiency anemia 12/18/2015  . Insomnia 12/18/2015  . Hypothyroidism 12/18/2015  . Rotator cuff tear 03/23/2014  . Osteoarthritis   . Asthma   . Ovarian cancer ([place])   . COPD (chronic obstructive pulmonary disease) ([place])   . Fibromyalgia   . RLS (restless legs syndrome)   . Cerebral vasculitis   . Sjogren's syndrome ([place])   . Raynaud's disease   . MVP (mitral valve prolapse)   . IBS (irritable bowel syndrome)   . Congestive dilated cardiomyopathy ([place]) 02/09/2014  . Dyspnea 12/13/2013  . Hyperlipidemia 11/26/2013  . Malaise and fatigue 11/26/2013  . Multiple drug allergies 11/02/2013  . History of ovarian cancer 10/01/2013  . GERD (gastroesophageal reflux disease) 10/01/2013  . Benign essential HTN 10/01/2013  . A-fib ([place]) 07/27/2013    DUNFORD, CHRISTINA P, PTA 05/22/2016, 1:57 PM  [facility] [place], [place], [address] Phone: [phone]   Fax:  [phone]  Name: Angela Wong MRN: [id number] Date of Birth: 02/02/1940

## 2016-05-27 ENCOUNTER — Encounter: Payer: Medicare Other | Admitting: Physical Therapy

## 2016-05-29 ENCOUNTER — Encounter: Payer: Self-pay | Admitting: Pharmacist

## 2016-05-29 ENCOUNTER — Ambulatory Visit (INDEPENDENT_AMBULATORY_CARE_PROVIDER_SITE_OTHER): Payer: Medicare Other | Admitting: Pharmacist

## 2016-05-29 DIAGNOSIS — I482 Chronic atrial fibrillation, unspecified: Secondary | ICD-10-CM

## 2016-05-29 LAB — COAGUCHEK XS/INR WAIVED
INR: 2.7 — ABNORMAL HIGH (ref 0.9–1.1)
Prothrombin Time: 32.2 s

## 2016-05-30 ENCOUNTER — Encounter: Payer: Medicare Other | Admitting: Physical Therapy

## 2016-06-03 ENCOUNTER — Encounter: Payer: Medicare Other | Admitting: Physical Therapy

## 2016-06-05 ENCOUNTER — Encounter: Payer: Self-pay | Admitting: Physical Therapy

## 2016-06-05 ENCOUNTER — Ambulatory Visit: Payer: Medicare Other | Admitting: Physical Therapy

## 2016-06-05 DIAGNOSIS — R293 Abnormal posture: Secondary | ICD-10-CM

## 2016-06-05 DIAGNOSIS — M6281 Muscle weakness (generalized): Secondary | ICD-10-CM | POA: Diagnosis not present

## 2016-06-05 DIAGNOSIS — M25611 Stiffness of right shoulder, not elsewhere classified: Secondary | ICD-10-CM | POA: Diagnosis not present
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
## 2016-06-12 ENCOUNTER — Ambulatory Visit: Payer: Medicare Other | Admitting: Physical Therapy

## 2016-06-12 ENCOUNTER — Encounter: Payer: Self-pay | Admitting: Physical Therapy

## 2016-06-12 DIAGNOSIS — R293 Abnormal posture: Secondary | ICD-10-CM | POA: Diagnosis not present

## 2016-06-12 DIAGNOSIS — M25611 Stiffness of right shoulder, not elsewhere classified: Secondary | ICD-10-CM

## 2016-06-12 DIAGNOSIS — M6281 Muscle weakness (generalized): Secondary | ICD-10-CM | POA: Diagnosis not present

## 2016-06-12 NOTE — Therapy (Addendum)
[facility] [place], [place], [address] Phone: [phone]   Fax:  [phone]  Physical Therapy Treatment  Patient Details  Name: Angela Wong MRN: [id number] Date of Birth: 1940-12-07 Referring Provider: [name]   Encounter Date: 06/12/2016      PT End of Session - 06/12/16 1255    Visit Number 10   Number of Visits 16   Date for PT Re-Evaluation 06/28/16   PT Start Time 1229   PT Stop Time 1313   PT Time Calculation (min) 44 min   Activity Tolerance Patient tolerated treatment well;Patient limited by pain   Behavior During Therapy [facility] for tasks assessed/performed      Past Medical History:  Diagnosis Date  . A-fib ([place])   . Asthma   . Cerebral vasculitis   . COPD (chronic obstructive pulmonary disease) ([place])   . Fibromyalgia   . GERD (gastroesophageal reflux disease)   . IBS (irritable bowel syndrome)   . MVP (mitral valve prolapse)   . Osteoarthritis   . Ovarian cancer ([place])    lymph node removal with hysterectomy  . Raynaud's disease   . RLS (restless legs syndrome)   . Sjogren's syndrome [facility])     Past Surgical History:  Procedure Laterality Date  . ABDOMINAL HYSTERECTOMY    . APPENDECTOMY    . BREAST SURGERY     Biopsy  . CATARACT EXTRACTION Left   . PACEMAKER INSERTION  09/15/2014    There were no vitals filed for this visit.      Subjective Assessment - 06/12/16 1231    Subjective Patient reported feeling some better then she went to move hevy boxes which increased pain in back and left hip   Patient Stated Goals I want to walk for 30 minutes for exercise and not lose balance.  Improve posture.   Currently in Pain? Other (Comment)  left back and hip   Pain Score 8                          [facility] Adult PT Treatment/Exercise - 06/12/16 0001      Neck Exercises: Seated   W Back Limitations 2x10   Shoulder Rolls Backwards;Forwards;20 reps   Upper Extremity D1  10 reps;Flexion  bil    Other Seated Exercise horizontal abd with yellow t-band 2x10     Lumbar Exercises: Stretches   Lower Trunk Rotation 5 reps;10 seconds     Lumbar Exercises: Supine   Ab Set 20 reps;3 seconds   Glut Set 3 seconds;20 reps   Clam 3 seconds;20 reps   Bent Knee Raise 20 reps;3 seconds     Knee/Hip Exercises: Stretches   Sports administrator Both;3 reps;10 seconds  standing     Knee/Hip Exercises: Standing   Heel Raises Both;10 reps;2 seconds;1 set   Hip Flexion Stengthening;Both;2 sets;10 reps;Knee bent  difficulty with right LE     Knee/Hip Exercises: Seated   Long Arc Quad Strengthening;Both;2 sets;10 reps   Long Arc Quad Weight 4 lbs.     Knee/Hip Exercises: Supine   Straight Leg Raises Strengthening;Both;10 reps   Other Supine Knee/Hip Exercises ball squeezex30     Shoulder Exercises: Supine   Flexion Strengthening;Both;20 reps  holding grey ball   Other Supine Exercises scap retraction 2x10   Other Supine Exercises supine chin tucks 2x10                  PT  Short Term Goals - 04/29/16 1111      PT SHORT TERM GOAL #1   Title STG's=LTG's.           PT Long Term Goals - 05/08/16 1350      PT LONG TERM GOAL #1   Title Independent with HEP.   Time 8   Period Weeks   Status Achieved     PT LONG TERM GOAL #2   Title Patient instructed in correct posture and able to verbalize.   Time 8   Period Weeks   Status Achieved     PT LONG TERM GOAL #3   Title Bilateral lE strength 5/5 to increas stability for functional tasks.   Time 8   Period Weeks   Status On-going     PT LONG TERM GOAL #4   Title Patient walk 30 minutes for exercises.   Time 8   Period Weeks   Status On-going               Plan - 06/12/16 1306    Clinical Impression Statement Patient tolerated treatment fairly well with limitations due to pain in left hip/back. Patient able to perform gentle strengthening and focus on activity tolerance in all positions.  Patient unable to stand or any prolong walking at this time due to pain. Patient continues to have set backs from lifting and performing heavy activities around home. Patient current goals ongoing due to strength deficts.    Rehab Potential Good   PT Frequency 2x / week   PT Duration 8 weeks   PT Treatment/Interventions ADLs/Self Care Home Management;Therapeutic activities;Therapeutic exercise;Balance training;Neuromuscular re-education;Patient/family education   PT Next Visit Plan cont woth POC per MPT for postural stretches; scapular strengthening: hip bridges; core exercises; hip and PAIN-FREE quadriceps strengthening.  Balance and gait activites.   Consulted and Agree with Plan of Care Patient      Patient will benefit from skilled therapeutic intervention in order to improve the following deficits and impairments:  Pain, Decreased activity tolerance, Abnormal gait, Decreased strength, Postural dysfunction  Visit Diagnosis: Muscle weakness (generalized)  Abnormal posture  Shoulder stiffness, right     Problem List Patient Active Problem List   Diagnosis Date Noted  . Hyperparathyroidism ([facility]) 01/16/2016  . Iron deficiency anemia 12/18/2015  . Insomnia 12/18/2015  . Hypothyroidism 12/18/2015  . Rotator cuff tear 03/23/2014  . Osteoarthritis   . Asthma   . Ovarian cancer ([facility])   . COPD (chronic obstructive pulmonary disease) ([facility])   . Fibromyalgia   . RLS (restless legs syndrome)   . Cerebral vasculitis   . Sjogren's syndrome ([facility])   . Raynaud's disease   . MVP (mitral valve prolapse)   . IBS (irritable bowel syndrome)   . Congestive dilated cardiomyopathy ([facility]) 02/09/2014  . Dyspnea 12/13/2013  . Hyperlipidemia 11/26/2013  . Malaise and fatigue 11/26/2013  . Multiple drug allergies 11/02/2013  . History of ovarian cancer 10/01/2013  . GERD (gastroesophageal reflux disease) 10/01/2013  . Benign essential HTN 10/01/2013  . A-fib ([facility]) 07/27/2013      , PTA 06/12/16 1:16 PM   [facility] [address] [place], [place], [address] Phone: [phone]   Fax:  [phone]  Name: Angela Wong MRN: [id number] Date of Birth: 07/24/1940  PHYSICAL THERAPY DISCHARGE SUMMARY  Visits from Start of Care: 10.  Current functional level related to goals / functional outcomes: See above.   Remaining deficits: Continued LE weakness.   Education /   Equipment: HEP. Plan: Patient agrees to discharge.  Patient goals were partially met. Patient is being discharged due to not returning since the last visit.  ?????         Mali Applegate MPT

## 2016-06-17 ENCOUNTER — Other Ambulatory Visit: Payer: Self-pay | Admitting: Family

## 2016-06-17 ENCOUNTER — Encounter: Payer: Self-pay | Admitting: Family Medicine

## 2016-06-17 ENCOUNTER — Encounter: Payer: Medicare Other | Admitting: Physical Therapy

## 2016-06-17 ENCOUNTER — Telehealth: Payer: Self-pay | Admitting: Pharmacist

## 2016-06-17 ENCOUNTER — Ambulatory Visit (INDEPENDENT_AMBULATORY_CARE_PROVIDER_SITE_OTHER): Payer: Medicare Other

## 2016-06-17 ENCOUNTER — Ambulatory Visit (INDEPENDENT_AMBULATORY_CARE_PROVIDER_SITE_OTHER): Payer: Medicare Other | Admitting: Family Medicine

## 2016-06-17 VITALS — BP 137/79 | HR 73 | Temp 97.8°F | Ht 64.75 in | Wt 137.0 lb

## 2016-06-17 DIAGNOSIS — I482 Chronic atrial fibrillation, unspecified: Secondary | ICD-10-CM

## 2016-06-17 DIAGNOSIS — M25552 Pain in left hip: Secondary | ICD-10-CM

## 2016-06-17 MED ORDER — PREDNISONE 10 MG PO TABS: ORAL_TABLET | ORAL | 0 refills | Status: DC
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
## 2016-06-17 NOTE — Telephone Encounter (Signed)
While taking prednisone, recommend taking warfarin 2mg  daily, then restart usual warfarin dose of 4mg  daily except 2mg  tuesdays and thursdays.  Recheck INR in 1 week. Tried to call patient but no answer.

## 2016-06-18 ENCOUNTER — Telehealth: Payer: Self-pay | Admitting: Pharmacist

## 2016-06-18 NOTE — Telephone Encounter (Signed)
See other phone message - duplicate

## 2016-06-18 NOTE — Telephone Encounter (Signed)
Patient notified of warfarin adjustment and next appt

## 2016-06-19 ENCOUNTER — Encounter: Payer: Medicare Other | Admitting: Physical Therapy

## 2016-06-20 ENCOUNTER — Other Ambulatory Visit: Payer: Self-pay | Admitting: Family

## 2016-06-20 DIAGNOSIS — J45909 Unspecified asthma, uncomplicated: Secondary | ICD-10-CM

## 2016-06-24 ENCOUNTER — Other Ambulatory Visit: Payer: Self-pay

## 2016-06-24 MED ORDER — RANITIDINE HCL 150 MG PO TABS: ORAL_TABLET | ORAL | 1 refills | Status: DC

## 2016-06-24 MED ORDER — BUMETANIDE 1 MG PO TABS: 1.0000 mg | ORAL_TABLET | ORAL | 1 refills | Status: DC

## 2016-06-25 ENCOUNTER — Other Ambulatory Visit: Payer: Self-pay | Admitting: Family

## 2016-06-26 ENCOUNTER — Encounter: Payer: Self-pay | Admitting: Pharmacist

## 2016-06-27 ENCOUNTER — Encounter: Payer: Self-pay | Admitting: Family

## 2016-06-27 ENCOUNTER — Ambulatory Visit (INDEPENDENT_AMBULATORY_CARE_PROVIDER_SITE_OTHER): Payer: Medicare Other | Admitting: Pharmacist

## 2016-06-27 ENCOUNTER — Ambulatory Visit (INDEPENDENT_AMBULATORY_CARE_PROVIDER_SITE_OTHER): Payer: Medicare Other | Admitting: Family

## 2016-06-27 VITALS — BP 164/92 | HR 87 | Temp 97.6°F | Ht 64.75 in | Wt 137.6 lb

## 2016-06-27 DIAGNOSIS — W57XXXA Bitten or stung by nonvenomous insect and other nonvenomous arthropods, initial encounter: Secondary | ICD-10-CM | POA: Diagnosis not present

## 2016-06-27 DIAGNOSIS — M1612 Unilateral primary osteoarthritis, left hip: Secondary | ICD-10-CM

## 2016-06-27 DIAGNOSIS — S70361A Insect bite (nonvenomous), right thigh, initial encounter: Secondary | ICD-10-CM | POA: Diagnosis not present

## 2016-06-27 DIAGNOSIS — I482 Chronic atrial fibrillation, unspecified: Secondary | ICD-10-CM

## 2016-06-27 LAB — COAGUCHEK XS/INR WAIVED
INR: 1.5 — ABNORMAL HIGH (ref 0.9–1.1)
Prothrombin Time: 18.5 s

## 2016-06-27 MED ORDER — OXYCODONE HCL 5 MG PO CAPS: 5.0000 mg | ORAL_CAPSULE | ORAL | 0 refills | Status: DC | PRN

## 2016-06-27 NOTE — Progress Notes (Signed)
Subjective:    Patient ID: Angela Wong, female    DOB: [birth year], 76 y.o.   MRN: [id number]  HPI Patient presents to the office with complaints of continued left hip pain.  Patient was seen on 06/17/16 and hip x-ray ordered which showed "moderate bilateral hip osteoarthritis with joint space narrowing subchondral sclerosis and marginal spur formation.  No displaced fractures identified."  Patient given steroid dose pack and is still taking these per the prescribed taper.  She states the prednisone is doing nothing for the pain in her hip.  Patient rates pain at 6/10 in the office, but states in the morning and often during the night her pain is 10/10 and she is so weak that her left knee will barely hold her up.  Patient is using a cane almost all the time to help her ambulate.  Patient is not taking anything besides prednisone for pain and states she has asked for pain medicine, but was told the prednisone pack should do the trick.  Patient also noticed what she believed to be a tick attached to her left lateral thigh on 06/18/16 after being seen in the office and she was able to remove it.  Patient has a large reddened area around the bite and states she was unsure whether this was due to a burn from a heating pad or the tick.     Review of Systems  Respiratory: Negative for cough and chest tightness.   Cardiovascular: Negative for chest pain and leg swelling.  Musculoskeletal: Positive for arthralgias (left hip x6 weeks).  Neurological: Positive for headaches.  All other systems reviewed and are negative.      Objective:   Physical Exam  Constitutional: She is oriented to person, place, and time. She appears well-developed and well-nourished. She appears distressed.  HENT:  Head: Normocephalic.  Neck: Normal range of motion. Neck supple. No JVD present. No thyromegaly present.  Cardiovascular: Normal rate, regular rhythm, normal heart sounds and intact distal pulses.   No murmur  heard. Pulmonary/Chest: Effort normal and breath sounds normal. No respiratory distress.  Abdominal: Soft. Bowel sounds are normal. She exhibits no distension. There is no tenderness.  Musculoskeletal: She exhibits tenderness (left hip decreased ROM and increased pain).  Lymphadenopathy:    She has no cervical adenopathy.  Neurological: She is alert and oriented to person, place, and time.  Skin: Skin is warm and dry. There is erythema.  Small wound with erythemas extending up thigh into buttocks  Psychiatric: She has a normal mood and affect. Her behavior is normal. Judgment and thought content normal.      BP (!) 156/96   Pulse 82   Temp 97.6 F (36.4 C)   Ht 5' 4.75" (1.645 m)   Wt 137 lb 9.6 oz (62.4 kg)   BMI 23.07 kg/m      Assessment & Plan:  1. Primary osteoarthritis of left hip Rest Continue prednisone ROM exercises f Follow up with Ortho! - Ambulatory referral to Orthopedic Surgery - oxycodone (OXY-IR) 5 MG capsule; Take 1 capsule (5 mg total) by mouth every 4 (four) hours as needed.  Dispense: 30 capsule; Refill: 0  2. Tick bite of right thigh, initial encounter I believe this erythemas is related to heating pad, will do lab work to rule out tick infections -Pt to report any new fever, joint pain, or rash -Wear protective clothing while outside- Long sleeves and long pants -Put insect repellent on all exposed skin and along clothing -  Take a shower as soon as possible after being outside - Lyme Ab/Western Blot Reflex - [place] mtn spotted fvr abs pnl(IgG+IgM)  Pt reviewed in [place] controlled Database, pt has not received any controlled medications recently. Per pt has not taken xanax since summer 2017. Keep chronic follow up  [name], FNP

## 2016-06-27 NOTE — Patient Instructions (Signed)
Hip Pain The hip is the joint between the upper legs and the lower pelvis. The bones, cartilage, tendons, and muscles of your hip joint support your body and allow you to move around. Hip pain can range from a minor ache to severe pain in one or both of your hips. The pain may be felt on the inside of the hip joint near the groin, or the outside near the buttocks and upper thigh. You may also have swelling or stiffness. Follow these instructions at home: Managing pain, stiffness, and swelling   If directed, apply ice to the injured area.  Put ice in a plastic bag.  Place a towel between your skin and the bag.  Leave the ice on for 20 minutes, 2-3 times a day  Sleep with a pillow between your legs on your most comfortable side.  Avoid any activities that cause pain. General instructions   Take over-the-counter and prescription medicines only as told by your health care provider.  Do any exercises as told by your health care provider.  Record the following:  How often you have hip pain.  The location of your pain.  What the pain feels like.  What makes the pain worse.  Keep all follow-up visits as told by your health care provider. This is important. Contact a health care provider if:  You cannot put weight on your leg.  Your pain or swelling continues or gets worse after one week.  It gets harder to walk.  You have a fever. Get help right away if:  You fall.  You have a sudden increase in pain and swelling in your hip.  Your hip is red or swollen or very tender to touch. Summary  Hip pain can range from a minor ache to severe pain in one or both of your hips.  The pain may be felt on the inside of the hip joint near the groin, or the outside near the buttocks and upper thigh.  Avoid any activities that cause pain.  Record how often you have hip pain, the location of the pain, what makes it worse and what it feels like. This information is not intended to  replace advice given to you by your health care provider. Make sure you discuss any questions you have with your health care provider. Document Released: 06/20/2009 Document Revised: 12/04/2015 Document Reviewed: 12/04/2015 Elsevier Interactive Patient Education  2017 [employer]  

## 2016-07-01 ENCOUNTER — Ambulatory Visit (INDEPENDENT_AMBULATORY_CARE_PROVIDER_SITE_OTHER): Payer: Medicare Other

## 2016-07-01 ENCOUNTER — Encounter: Payer: Self-pay | Admitting: Family

## 2016-07-01 ENCOUNTER — Ambulatory Visit (INDEPENDENT_AMBULATORY_CARE_PROVIDER_SITE_OTHER): Payer: Medicare Other | Admitting: Family

## 2016-07-01 VITALS — BP 105/66 | HR 73 | Temp 96.9°F | Ht 64.0 in | Wt 137.0 lb

## 2016-07-01 DIAGNOSIS — E039 Hypothyroidism, unspecified: Secondary | ICD-10-CM

## 2016-07-01 DIAGNOSIS — I10 Essential (primary) hypertension: Secondary | ICD-10-CM

## 2016-07-01 DIAGNOSIS — Z780 Asymptomatic menopausal state: Secondary | ICD-10-CM

## 2016-07-01 DIAGNOSIS — G4700 Insomnia, unspecified: Secondary | ICD-10-CM | POA: Diagnosis not present

## 2016-07-01 DIAGNOSIS — I482 Chronic atrial fibrillation, unspecified: Secondary | ICD-10-CM

## 2016-07-01 DIAGNOSIS — E785 Hyperlipidemia, unspecified: Secondary | ICD-10-CM

## 2016-07-01 DIAGNOSIS — J45909 Unspecified asthma, uncomplicated: Secondary | ICD-10-CM | POA: Diagnosis not present

## 2016-07-01 DIAGNOSIS — K219 Gastro-esophageal reflux disease without esophagitis: Secondary | ICD-10-CM

## 2016-07-01 DIAGNOSIS — D509 Iron deficiency anemia, unspecified: Secondary | ICD-10-CM | POA: Diagnosis not present

## 2016-07-01 DIAGNOSIS — M1990 Unspecified osteoarthritis, unspecified site: Secondary | ICD-10-CM | POA: Diagnosis not present

## 2016-07-01 LAB — COAGUCHEK XS/INR WAIVED
INR: 3.2 — ABNORMAL HIGH (ref 0.9–1.1)
Prothrombin Time: 38.6 s

## 2016-07-01 MED ORDER — TRAZODONE HCL 150 MG PO TABS: 150.0000 mg | ORAL_TABLET | Freq: Every day | ORAL | 0 refills | Status: DC

## 2016-07-01 NOTE — Progress Notes (Signed)
Subjective:    Patient ID: Angela Wong, female    DOB: 1940-10-20, 76 y.o.   MRN: [id number]  Pt presents to the office today for chronic follow up. PT is followed by Cardiologists for CHF and A Fib that are stable at this time.  Hypertension  This is a chronic problem. The current episode started more than 1 year ago. The problem has been resolved since onset. The problem is controlled. Associated symptoms include malaise/fatigue and shortness of breath ("when walking"). Pertinent negatives include no peripheral edema. Risk factors for coronary artery disease include dyslipidemia, post-menopausal state and sedentary lifestyle. The current treatment provides moderate improvement. Hypertensive end-organ damage includes heart failure. There is no history of CAD/MI or CVA. Identifiable causes of hypertension include a thyroid problem.  Asthma  She complains of cough, hoarse voice, shortness of breath ("when walking") and wheezing. This is a chronic problem. The current episode started more than 1 year ago. The problem occurs rarely. The problem has been waxing and waning. Associated symptoms include heartburn and malaise/fatigue. She reports moderate improvement on treatment. Her symptoms are not alleviated by rest. Her past medical history is significant for asthma.  Thyroid Problem  Presents for follow-up visit. Symptoms include diarrhea and hoarse voice. Patient reports no constipation, dry skin or weight gain. The symptoms have been stable. Her past medical history is significant for heart failure and hyperlipidemia.  Hyperlipidemia  This is a chronic problem. The current episode started more than 1 year ago. The problem is uncontrolled. Recent lipid tests were reviewed and are high. Associated symptoms include shortness of breath ("when walking"). Current antihyperlipidemic treatment includes diet change. The current treatment provides mild improvement of lipids.  Hip Pain   The incident  occurred more than 1 week ago. The pain is present in the left hip. The quality of the pain is described as aching. The pain is at a severity of 8/10. The pain is moderate. Associated symptoms include an inability to bear weight. She reports no foreign bodies present. The symptoms are aggravated by movement.  Arthritis  Presents for follow-up visit. She complains of pain and stiffness. Affected locations include the left knee. Her pain is at a severity of 8/10. Associated symptoms include diarrhea.  Gastroesophageal Reflux  She complains of coughing, heartburn, a hoarse voice and wheezing. This is a chronic problem. The current episode started more than 1 month ago. The problem occurs occasionally. The problem has been waxing and waning. She has tried a PPI for the symptoms. The treatment provided moderate relief.  Anemia  Presents for follow-up visit. Symptoms include bruises/bleeds easily and malaise/fatigue. Past medical history includes heart failure.  Insomnia  Primary symptoms: sleep disturbance, difficulty falling asleep, frequent awakening, malaise/fatigue.  The current episode started more than one year. The onset quality is gradual. The problem occurs intermittently. The problem has been waxing and waning since onset.      Review of Systems  Constitutional: Positive for malaise/fatigue. Negative for weight gain.  HENT: Positive for hoarse voice.   Respiratory: Positive for cough, shortness of breath ("when walking") and wheezing.   Gastrointestinal: Positive for diarrhea and heartburn. Negative for constipation.  Musculoskeletal: Positive for arthritis and stiffness.  Hematological: Bruises/bleeds easily.  Psychiatric/Behavioral: Positive for sleep disturbance. The patient has insomnia.   All other systems reviewed and are negative.      Objective:   Physical Exam  Constitutional: She is oriented to person, place, and time. She appears well-developed and well-nourished. No  distress.  HENT:  Head: Normocephalic and atraumatic.  Right Ear: External ear normal.  Left Ear: External ear normal.  Nose: Nose normal.  Mouth/Throat: Oropharynx is clear and moist.  Eyes: Pupils are equal, round, and reactive to light.  Neck: Normal range of motion. Neck supple. No thyromegaly present.  Cardiovascular: Normal rate, regular rhythm, normal heart sounds and intact distal pulses.   No murmur heard. Pulmonary/Chest: Effort normal and breath sounds normal. No respiratory distress. She has no wheezes.  Abdominal: Soft. Bowel sounds are normal. She exhibits no distension. There is no tenderness.  Musculoskeletal: She exhibits no edema or tenderness.  Pain in right hip and knee with flexion and extension  Neurological: She is alert and oriented to person, place, and time.  Skin: Skin is warm and dry.  Psychiatric: She has a normal mood and affect. Her behavior is normal. Judgment and thought content normal.  Vitals reviewed.    BP 105/66   Pulse 73   Temp (!) 96.9 F (36.1 C) (Oral)   Ht '5\' 4"'  (1.626 m)   Wt 137 lb (62.1 kg)   BMI 23.52 kg/m      Assessment & Plan:  1. Benign essential HTN - CMP14+EGFR  2. Chronic atrial fibrillation (HCC) - CMP14+EGFR - CoaguChek XS/INR Waived  3. Uncomplicated asthma, unspecified asthma severity, unspecified whether persistent - CMP14+EGFR  4. Gastroesophageal reflux disease, esophagitis presence not specified - CMP14+EGFR  5. Hypothyroidism, unspecified type - CMP14+EGFR - Thyroid Panel With TSH  6. Osteoarthritis, unspecified osteoarthritis type, unspecified site - CMP14+EGFR  7. Iron deficiency anemia, unspecified iron deficiency anemia type  - CMP14+EGFR - Anemia Profile B  8. Hyperlipidemia, unspecified hyperlipidemia type - CMP14+EGFR  9. Insomnia, unspecified type  - traZODone (DESYREL) 150 MG tablet; Take 1 tablet (150 mg total) by mouth at bedtime.  Dispense: 90 tablet; Refill: 0 -  CMP14+EGFR  10. Post-menopause - DG [facility] DEXA   Continue all meds, pt made [facility] appt this Thursday at our office Labs pending Health Maintenance reviewed Diet and exercise encouraged RTO 4 months   [name], FNP

## 2016-07-01 NOTE — Patient Instructions (Signed)

## 2016-07-02 ENCOUNTER — Telehealth: Payer: Self-pay

## 2016-07-02 ENCOUNTER — Other Ambulatory Visit: Payer: Self-pay | Admitting: Family

## 2016-07-02 DIAGNOSIS — I482 Chronic atrial fibrillation, unspecified: Secondary | ICD-10-CM

## 2016-07-02 LAB — THYROID PANEL WITH TSH
FREE THYROXINE INDEX: 2.6 (ref 1.2–4.9)
T3 UPTAKE RATIO: 30 % (ref 24–39)
T4 TOTAL: 8.8 ug/dL (ref 4.5–12.0)
TSH: 1.84 u[IU]/mL (ref 0.450–4.500)

## 2016-07-02 LAB — ANEMIA PROFILE B
BASOS: 0 %
Basophils Absolute: 0 10*3/uL (ref 0.0–0.2)
EOS (ABSOLUTE): 0.1 10*3/uL (ref 0.0–0.4)
EOS: 0 %
FERRITIN: 59 ng/mL (ref 15–150)
Folate: >20 ng/mL (ref >3.0)
HEMATOCRIT: 43.7 % (ref 34.0–46.6)
HEMOGLOBIN: 14.5 g/dL (ref 11.1–15.9)
IMMATURE GRANS (ABS): 0 10*3/uL (ref 0.0–0.1)
IMMATURE GRANULOCYTES: 0 %
IRON SATURATION: 27 % (ref 15–55)
Iron: 73 ug/dL (ref 27–139)
LYMPHS: 10 %
Lymphocytes Absolute: 1.2 10*3/uL (ref 0.7–3.1)
MCH: 30.9 pg (ref 26.6–33.0)
MCHC: 33.2 g/dL (ref 31.5–35.7)
MCV: 93 fL (ref 79–97)
MONOCYTES: 6 %
MONOS ABS: 0.7 10*3/uL (ref 0.1–0.9)
NEUTROS PCT: 84 %
Neutrophils Absolute: 10.5 10*3/uL — ABNORMAL HIGH (ref 1.4–7.0)
Platelets: 214 10*3/uL (ref 150–379)
RBC: 4.69 x10E6/uL (ref 3.77–5.28)
RDW: 13.3 % (ref 12.3–15.4)
RETIC CT PCT: 1.1 % (ref 0.6–2.6)
Total Iron Binding Capacity: 275 ug/dL (ref 250–450)
UIBC: 202 ug/dL (ref 118–369)
Vitamin B-12: 1292 pg/mL — ABNORMAL HIGH (ref 232–1245)
WBC: 12.5 10*3/uL — AB (ref 3.4–10.8)

## 2016-07-02 LAB — CMP14+EGFR
A/G RATIO: 2.3 — AB (ref 1.2–2.2)
ALK PHOS: 84 IU/L (ref 39–117)
ALT: 15 IU/L (ref 0–32)
AST: 13 IU/L (ref 0–40)
Albumin: 4.1 g/dL (ref 3.5–4.8)
BILIRUBIN TOTAL: 0.7 mg/dL (ref 0.0–1.2)
BUN/Creatinine Ratio: 24 (ref 12–28)
BUN: 20 mg/dL (ref 8–27)
CHLORIDE: 96 mmol/L (ref 96–106)
CO2: 27 mmol/L (ref 20–29)
Calcium: 9.1 mg/dL (ref 8.7–10.3)
Creatinine, Ser: 0.85 mg/dL (ref 0.57–1.00)
GFR calc Af Amer: 77 mL/min/{1.73_m2} (ref >59)
GFR calc non Af Amer: 67 mL/min/{1.73_m2} (ref >59)
GLOBULIN, TOTAL: 1.8 g/dL (ref 1.5–4.5)
Glucose: 99 mg/dL (ref 65–99)
POTASSIUM: 3.6 mmol/L (ref 3.5–5.2)
SODIUM: 140 mmol/L (ref 134–144)
Total Protein: 5.9 g/dL — ABNORMAL LOW (ref 6.0–8.5)

## 2016-07-02 LAB — LYME AB/WESTERN BLOT REFLEX

## 2016-07-02 LAB — ROCKY MTN SPOTTED FVR ABS PNL(IGG+IGM)
RMSF IGG: NEGATIVE
RMSF IGM: 0.28 {index} (ref 0.00–0.89)

## 2016-07-02 MED ORDER — AZITHROMYCIN 250 MG PO TABS
ORAL_TABLET | ORAL | 0 refills | Status: DC
Start: 2016-07-02 — End: 2016-07-04

## 2016-07-02 NOTE — Telephone Encounter (Signed)
Spoke with patient about her elevated WBC.  She said she has been experiencing some sinus problems, pain in her sinus cavities, headaches, dental pain.  She reports she uses [facility] and can only take Zpak.

## 2016-07-02 NOTE — Telephone Encounter (Signed)
Prescription sent to pharmacy.

## 2016-07-04 ENCOUNTER — Ambulatory Visit (INDEPENDENT_AMBULATORY_CARE_PROVIDER_SITE_OTHER): Payer: Medicare Other

## 2016-07-04 ENCOUNTER — Ambulatory Visit (INDEPENDENT_AMBULATORY_CARE_PROVIDER_SITE_OTHER): Payer: Medicare Other | Admitting: Family

## 2016-07-04 ENCOUNTER — Other Ambulatory Visit: Payer: Self-pay | Admitting: Orthopedic Surgery

## 2016-07-04 ENCOUNTER — Encounter: Payer: Self-pay | Admitting: Family

## 2016-07-04 ENCOUNTER — Other Ambulatory Visit: Payer: Medicare Other

## 2016-07-04 VITALS — BP 152/90 | HR 70 | Temp 98.3°F | Ht 64.0 in | Wt 138.0 lb

## 2016-07-04 DIAGNOSIS — M7062 Trochanteric bursitis, left hip: Secondary | ICD-10-CM | POA: Diagnosis not present

## 2016-07-04 DIAGNOSIS — J0100 Acute maxillary sinusitis, unspecified: Secondary | ICD-10-CM

## 2016-07-04 DIAGNOSIS — M25562 Pain in left knee: Secondary | ICD-10-CM

## 2016-07-04 DIAGNOSIS — R52 Pain, unspecified: Secondary | ICD-10-CM

## 2016-07-04 DIAGNOSIS — M1712 Unilateral primary osteoarthritis, left knee: Secondary | ICD-10-CM | POA: Diagnosis not present

## 2016-07-04 MED ORDER — AZITHROMYCIN 250 MG PO TABS: ORAL_TABLET | ORAL | 0 refills | Status: DC

## 2016-07-04 NOTE — Patient Instructions (Signed)

## 2016-07-04 NOTE — Progress Notes (Signed)
   Subjective:    Patient ID: Angela Wong, female    DOB: 1940/08/24, 76 y.o.   MRN: [id number]  Sinusitis  This is a new problem. The current episode started 1 to 4 weeks ago. The problem is unchanged. There has been no fever. Her pain is at a severity of 8/10. The pain is moderate. Associated symptoms include congestion, ear pain, headaches, a hoarse voice, sinus pressure, a sore throat and swollen glands. Past treatments include oral decongestants. The treatment provided mild relief.      Review of Systems  HENT: Positive for congestion, ear pain, hoarse voice, sinus pressure and sore throat.   Musculoskeletal: Positive for arthralgias and gait problem.  Neurological: Positive for headaches.  All other systems reviewed and are negative.      Objective:   Physical Exam  Constitutional: She is oriented to person, place, and time. She appears well-developed and well-nourished. No distress.  HENT:  Head: Normocephalic and atraumatic.  Right Ear: External ear normal.  Left Ear: External ear normal.  Nose: Mucosal edema and rhinorrhea present. Right sinus exhibits maxillary sinus tenderness. Left sinus exhibits maxillary sinus tenderness.  Mouth/Throat: Posterior oropharyngeal erythema present.  Eyes: Pupils are equal, round, and reactive to light.  Neck: Normal range of motion. Neck supple. No thyromegaly present.  Cardiovascular: Normal rate, regular rhythm, normal heart sounds and intact distal pulses.   No murmur heard. Pulmonary/Chest: Effort normal and breath sounds normal. No respiratory distress. She has no wheezes.  Abdominal: Soft. Bowel sounds are normal. She exhibits no distension. There is no tenderness.  Musculoskeletal: Normal range of motion. She exhibits no edema or tenderness.  Neurological: She is alert and oriented to person, place, and time. She has normal reflexes. No cranial nerve deficit.  Skin: Skin is warm and dry.  Psychiatric: She has a normal mood and  affect. Her behavior is normal. Judgment and thought content normal.  Vitals reviewed.     BP (!) 152/90   Pulse 70   Temp 98.3 F (36.8 C) (Oral)   Ht 5\' 4"  (1.626 m)   Wt 138 lb (62.6 kg)   BMI 23.69 kg/m      Assessment & Plan:  1. Acute maxillary sinusitis, recurrence not specified - Take meds as prescribed - Use a cool mist humidifier  -Use saline nose sprays frequently -Saline irrigations of the nose can be very helpful if done frequently.  * 4X daily for 1 week*  * Use of a nettie pot can be helpful with this. Follow directions with this* -Force fluids -For any cough or congestion  Use plain Mucinex- regular strength or max strength is fine   * Children- consult with Pharmacist for dosing -For fever or aces or pains- take tylenol or ibuprofen appropriate for age and weight.  * for fevers greater than 101 orally you may alternate ibuprofen and tylenol every  3 hours. -Throat lozenges if help - azithromycin (ZITHROMAX) 250 MG tablet; Take 500 mg once, then 250 mg for four days  Dispense: 6 tablet; Refill: 0   [name], FNP

## 2016-07-10 ENCOUNTER — Encounter: Payer: Self-pay | Admitting: Pharmacist

## 2016-07-11 ENCOUNTER — Ambulatory Visit (INDEPENDENT_AMBULATORY_CARE_PROVIDER_SITE_OTHER): Payer: Medicare Other | Admitting: Pharmacist

## 2016-07-11 ENCOUNTER — Encounter: Payer: Self-pay | Admitting: Pharmacist

## 2016-07-11 VITALS — Ht 64.0 in | Wt 137.5 lb

## 2016-07-11 DIAGNOSIS — T380X5A Adverse effect of glucocorticoids and synthetic analogues, initial encounter: Secondary | ICD-10-CM | POA: Diagnosis not present

## 2016-07-11 DIAGNOSIS — I482 Chronic atrial fibrillation, unspecified: Secondary | ICD-10-CM

## 2016-07-11 DIAGNOSIS — R1310 Dysphagia, unspecified: Secondary | ICD-10-CM | POA: Diagnosis not present

## 2016-07-11 DIAGNOSIS — M8580 Other specified disorders of bone density and structure, unspecified site: Secondary | ICD-10-CM | POA: Diagnosis not present

## 2016-07-11 DIAGNOSIS — K219 Gastro-esophageal reflux disease without esophagitis: Secondary | ICD-10-CM | POA: Diagnosis not present

## 2016-07-11 DIAGNOSIS — Z8639 Personal history of other endocrine, nutritional and metabolic disease: Secondary | ICD-10-CM | POA: Diagnosis not present

## 2016-07-11 LAB — COAGUCHEK XS/INR WAIVED
INR: 4.5 — ABNORMAL HIGH (ref 0.9–1.1)
Prothrombin Time: 54.5 s

## 2016-07-11 NOTE — Progress Notes (Signed)
Patient ID: Angela Wong, female   DOB: 01-Jan-1941, 76 y.o.   MRN: [id number]    HPI: Patient has been referred by her PCP to review DEXA results and discuss treatment.  07/01/2016 was patient's first DEXA here but she states she has had other DEXA's done when she lived in [place].   She takes prednisone chronically for Sjogren's syndrome.   She reports that she has broken a toe as and adult but fractured arm was from childhood  Back Pain?  Yes       Kyphosis?  Yes  Med(s) for Osteoporosis/Osteopenia:  None currently except vitamin D Med(s) previously tried for Osteoporosis/Osteopenia:  Has tried Actonel and Fosamax but stopped but to increased hoarseness and esophagitis.  She continues to have GERD and difficulty swallowing.   She also reports that in past she was told that she has hyperparathyroidism and a very low vitamin D.    Angela Wong is also taking chronic anticoagulation secondary to atrial fibrillation.  Current warfarin dose is 4mg  qd except 2mg  tuesdays and thursdays.  She has recnetly finished azithromycin pack and prednisone dose pack.                                                              PMH: Hysterectomy?  Yes Oophorectomy?  Yes HRT? No Steroid Use?  Yes - Current.  Type/duration: 5mg  daily (also just finished steroid dose pack) Thyroid med?  Yes History of cancer?  No History of digestive disorders (ie Crohn's)?  Yes - GERD / difficulty swallowing - on chronic PPI therapy Current or previous eating disorders?  No Last Vitamin D Result:  Checking today Last GFR Result:  67 (07/01/2016)   FH/SH: Family history of osteoporosis?  No Parent with history of hip fracture?  No Family history of breast cancer?  Yes  - mother and 2 maternal aunts Exercise?  No - not currently due to knee pain Smoking?  No Alcohol?  No    Calcium Assessment Calcium Intake  # of servings/day  Calcium mg  Milk (8 oz) 0  x  300  = 0  Yogurt (4 oz) 3 x  200 = 600mg   Cheese (1  oz) 1 x  200 = 200mg   Other Calcium sources   250mg   Ca supplement 0 = 0   Estimated calcium intake per day 1050mg     DEXA Results Date of Test T-Score for AP Spine L1-L4 T-Score for Neck of  Left Hip  07/01/2016 -1.7 -1.7               FRAX 10 year estimate: Total FX risk:  19%  (consider medication if >/= 20%) Hip FX risk:  5%  (consider medication if >/= 3%)  INR was 4.5 today   Assessment: Osteopenia, steroid induced with high fracture risk per FRAX supra therapeutic INR - likely related to ABX and prednisone dose pack therapy Difficulty swallowing / uncontrolled GERD H/O hyperparathyroidism - per patient report  Recommendations: 1.  Discussed several medication options - patient declined 2.  recommend calcium 1200mg  daily through supplementation or diet.  3.  recommend weight bearing exercise - patient is to have PT soon.  Will have them help her with exercises to help with balance, muscle strength and bone  4.  Counseled and educated about fall risk and prevention. 5.  No warfarin for 2 days, then increase dose to 2mg  MWF and 4mg  all other days.  6.  Recheck INR in 1 week 7.  Recheck DEXA in 1 year due to chronic prednisone therpay 8.  Spoke to her PCP and OK's referral to GI for evaluation GERD / difficulty swallowing  Orders Placed This Encounter  Procedures  . PTH, Intact and Calcium  . VITAMIN D 25 Hydroxy (Vit-D Deficiency, Fractures)  . Vitamin D 1,25 dihydroxy  . Magnesium

## 2016-07-11 NOTE — Patient Instructions (Signed)
Exercise for Strong Bones  Exercise is important to build and maintain strong bones / bone density.  There are 2 types of exercises that are important to building and maintaining strong bones:  Weight- bearing and muscle-stregthening.  Weight-bearing Exercises  These exercises include activities that make you move against gravity while staying upright. Weight-bearing exercises can be high-impact or low-impact.  High-impact weight-bearing exercises help build bones and keep them strong. If you have broken a bone due to osteoporosis or are at risk of breaking a bone, you may need to avoid high-impact exercises. If you're not sure, you should check with your healthcare provider.  Examples of high-impact weight-bearing exercises are: Dancing  Doing high-impact aerobics  Hiking  Jogging/running  Jumping Rope  Stair climbing  Tennis  Low-impact weight-bearing exercises can also help keep bones strong and are a safe alternative if you cannot do high-impact exercises.   Examples of low-impact weight-bearing exercises are: Using elliptical training machines  Doing low-impact aerobics  Using stair-step machines  Fast walking on a treadmill or outside   Muscle-Strengthening Exercises These exercises include activities where you move your body, a weight or some other resistance against gravity. They are also known as resistance exercises and include: Lifting weights  Using elastic exercise bands  Using weight machines  Lifting your own body weight  Functional movements, such as standing and rising up on your toes  Yoga and Pilates can also improve strength, balance and flexibility. However, certain positions may not be safe for people with osteoporosis or those at increased risk of broken bones. For example, exercises that have you bend forward may increase the chance of breaking a bone in the spine.   Non-Impact Exercises There are other types of exercises that can help  prevent falls.  Non-impact exercises can help you to improve balance, posture and how well you move in everyday activities. Some of these exercises include: Balance exercises that strengthen your legs and test your balance, such as Tai Chi, can decrease your risk of falls.  Posture exercises that improve your posture and reduce rounded or "sloping" shoulders can help you decrease the chance of breaking a bone, especially in the spine.  Functional exercises that improve how well you move can help you with everyday activities and decrease your chance of falling and breaking a bone. For example, if you have trouble getting up from a chair or climbing stairs, you should do these activities as exercises.   **A physical therapist can teach you balance, posture and functional exercises. He/she can also help you learn which exercises are safe and appropriate for you.  [facility] has a physical therapy office in [place] in front of our office and referrals can be made for assessments and treatment as needed and strength and balance training.  If you would like to have an assessment with Chad and our physical therapy team please let a nurse or provider know.   Fall Prevention in the Home Falls can cause injuries and can affect people from all age groups. There are many simple things that you can do to make your home safe and to help prevent falls. What can I do on the outside of my home?  Regularly repair the edges of walkways and driveways and fix any cracks.  Remove high doorway thresholds.  Trim any shrubbery on the main path into your home.  Use bright outdoor lighting.  Clear walkways of debris and clutter, including tools and rocks.  Regularly check that handrails   are securely fastened and in good repair. Both sides of any steps should have handrails.  Install guardrails along the edges of any raised decks or porches.  Have leaves, snow, and ice cleared regularly.  Use sand or salt on  walkways during winter months.  In the garage, clean up any spills right away, including grease or oil spills. What can I do in the bathroom?  Use night lights.  Install grab bars by the toilet and in the tub and shower. Do not use towel bars as grab bars.  Use non-skid mats or decals on the floor of the tub or shower.  If you need to sit down while you are in the shower, use a plastic, non-slip stool.  Keep the floor dry. Immediately clean up any water that spills on the floor.  Remove soap buildup in the tub or shower on a regular basis.  Attach bath mats securely with double-sided non-slip rug tape.  Remove throw rugs and other tripping hazards from the floor. What can I do in the bedroom?  Use night lights.  Make sure that a bedside light is easy to reach.  Do not use oversized bedding that drapes onto the floor.  Have a firm chair that has side arms to use for getting dressed.  Remove throw rugs and other tripping hazards from the floor. What can I do in the kitchen?  Clean up any spills right away.  Avoid walking on wet floors.  Place frequently used items in easy-to-reach places.  If you need to reach for something above you, use a sturdy step stool that has a grab bar.  Keep electrical cables out of the way.  Do not use floor polish or wax that makes floors slippery. If you have to use wax, make sure that it is non-skid floor wax.  Remove throw rugs and other tripping hazards from the floor. What can I do in the stairways?  Do not leave any items on the stairs.  Make sure that there are handrails on both sides of the stairs. Fix handrails that are broken or loose. Make sure that handrails are as long as the stairways.  Check any carpeting to make sure that it is firmly attached to the stairs. Fix any carpet that is loose or worn.  Avoid having throw rugs at the top or bottom of stairways, or secure the rugs with carpet tape to prevent them from  moving.  Make sure that you have a light switch at the top of the stairs and the bottom of the stairs. If you do not have them, have them installed. What are some other fall prevention tips?  Wear closed-toe shoes that fit well and support your feet. Wear shoes that have rubber soles or low heels.  When you use a stepladder, make sure that it is completely opened and that the sides are firmly locked. Have someone hold the ladder while you are using it. Do not climb a closed stepladder.  Add color or contrast paint or tape to grab bars and handrails in your home. Place contrasting color strips on the first and last steps.  Use mobility aids as needed, such as canes, walkers, scooters, and crutches.  Turn on lights if it is dark. Replace any light bulbs that burn out.  Set up furniture so that there are clear paths. Keep the furniture in the same spot.  Fix any uneven floor surfaces.  Choose a carpet design that does not hide the edge   of steps of a stairway.  Be aware of any and all pets.  Review your medicines with your healthcare provider. Some medicines can cause dizziness or changes in blood pressure, which increase your risk of falling. Talk with your health care provider about other ways that you can decrease your risk of falls. This may include working with a physical therapist or trainer to improve your strength, balance, and endurance. This information is not intended to replace advice given to you by your health care provider. Make sure you discuss any questions you have with your health care provider. Document Released: 12/21/2001 Document Revised: 05/30/2015 Document Reviewed: 02/04/2014 Elsevier Interactive Patient Education  2017 [employer].

## 2016-07-12 ENCOUNTER — Encounter: Payer: Self-pay | Admitting: Internal Medicine

## 2016-07-12 LAB — PTH, INTACT AND CALCIUM
Calcium: 9.4 mg/dL (ref 8.7–10.3)
PTH: 43 pg/mL (ref 15–65)

## 2016-07-14 ENCOUNTER — Other Ambulatory Visit: Payer: Self-pay | Admitting: Family

## 2016-07-15 ENCOUNTER — Encounter: Payer: Self-pay | Admitting: Pharmacist

## 2016-07-15 LAB — VITAMIN D 1,25 DIHYDROXY
VITAMIN D3 1, 25 (OH): 36 pg/mL
Vitamin D 1, 25 (OH)2 Total: 37 pg/mL
Vitamin D2 1, 25 (OH)2: <10 pg/mL

## 2016-07-15 LAB — VITAMIN D 25 HYDROXY (VIT D DEFICIENCY, FRACTURES): VIT D 25 HYDROXY: 35.9 ng/mL (ref 30.0–100.0)

## 2016-07-15 LAB — MAGNESIUM: MAGNESIUM: 2.3 mg/dL (ref 1.6–2.3)

## 2016-07-22 ENCOUNTER — Encounter: Payer: Self-pay | Admitting: Family

## 2016-07-22 ENCOUNTER — Ambulatory Visit (INDEPENDENT_AMBULATORY_CARE_PROVIDER_SITE_OTHER): Payer: Medicare Other | Admitting: Family

## 2016-07-22 VITALS — BP 138/81 | HR 75 | Temp 97.4°F | Ht 64.0 in | Wt 138.0 lb

## 2016-07-22 DIAGNOSIS — I482 Chronic atrial fibrillation, unspecified: Secondary | ICD-10-CM

## 2016-07-22 DIAGNOSIS — J0101 Acute recurrent maxillary sinusitis: Secondary | ICD-10-CM

## 2016-07-22 LAB — COAGUCHEK XS/INR WAIVED
INR: 3.7 — AB (ref 0.9–1.1)
PROTHROMBIN TIME: 44.3 s

## 2016-07-22 NOTE — Patient Instructions (Signed)

## 2016-07-22 NOTE — Progress Notes (Signed)
   Subjective:     Patient ID: Angela Wong, female    DOB: 04-24-1940, 76 y.o.   MRN: [id number]  Pt presents to the office today to recheck sinus infection. PT was seen on 07/04/16 and given a zpak. PT states that mildly helped, but continues to have facial pain, sore throat, and a metallic taste. Pt has multiple allergies. She takes Biomedical engineer daily.   She also is here to recheck her INR. See anticoagulation flow sheet. She is on warfarin for A fib.  Sinusitis  This is a recurrent problem. The current episode started 1 to 4 weeks ago. The problem is unchanged. Her pain is at a severity of 2/10. The pain is mild. Associated symptoms include chills, coughing, sinus pressure, sneezing and a sore throat. Pertinent negatives include no congestion. Past treatments include antibiotics and lying down. The treatment provided mild relief.      Review of Systems  Constitutional: Positive for chills.  HENT: Positive for sinus pressure, sneezing and sore throat. Negative for congestion.   Eyes: Positive for itching.  Respiratory: Positive for cough.   All other systems reviewed and are negative.      Objective:   Physical Exam  Constitutional: She is oriented to person, place, and time. She appears well-developed and well-nourished. No distress.  HENT:  Head: Normocephalic and atraumatic.  Right Ear: External ear normal.  Left Ear: External ear normal.  Nose: Mucosal edema and rhinorrhea present. Right sinus exhibits maxillary sinus tenderness. Left sinus exhibits maxillary sinus tenderness.  Mouth/Throat: Posterior oropharyngeal erythema present.  Eyes: Pupils are equal, round, and reactive to light.  Neck: Normal range of motion. Neck supple. No thyromegaly present.  Cardiovascular: Normal rate, regular rhythm, normal heart sounds and intact distal pulses.   No murmur heard. Pulmonary/Chest: Effort normal and breath sounds normal. No respiratory distress. She has no wheezes.    Abdominal: Soft. Bowel sounds are normal. She exhibits no distension. There is no tenderness.  Musculoskeletal: Normal range of motion. She exhibits tenderness (bilateral knees with flexion). She exhibits no edema.  Using cane to walk   Neurological: She is alert and oriented to person, place, and time.  Skin: Skin is warm and dry.  Psychiatric: She has a normal mood and affect. Her behavior is normal. Judgment and thought content normal.  Vitals reviewed.     BP 138/81   Pulse 75   Temp (!) 97.4 F (36.3 C) (Oral)   Ht 5\' 4"  (1.626 m)   Wt 138 lb (62.6 kg)   BMI 23.69 kg/m      Assessment & Plan:  1. Chronic atrial fibrillation (HCC) Anticoagulation Warfarin Dose Instructions as of 07/22/2016      Dorene Grebe Tue Wed Thu Fri Sat   New Dose 4 mg 2 mg 2 mg 2 mg 4 mg 2 mg 2 mg    Description   No warfarin today (07/22/2016) Then decrease warfarin dose of 2mg  on Mondays, Tuesdays, Wednesdays, Fridays, and Saturdays.  Take 4mg  all other days.  INR was 3.7 today (goal is 2.0 to 3.0)    - CoaguChek XS/INR Waived Follow up with Tammy 1-2 weeks   2. Acute recurrent maxillary sinusitis Continue Singulair and Allergra Pt has multiple allergies, we will hold off on giving antibiotic today. If symptoms worsen will send to ENT Humidifier  Avoid allergens RTO prn and keep chronic follow up  [name], FNP

## 2016-07-24 DIAGNOSIS — Z45010 Encounter for checking and testing of cardiac pacemaker pulse generator [battery]: Secondary | ICD-10-CM | POA: Diagnosis not present

## 2016-07-24 DIAGNOSIS — I482 Chronic atrial fibrillation: Secondary | ICD-10-CM | POA: Diagnosis not present

## 2016-07-24 DIAGNOSIS — I472 Ventricular tachycardia: Secondary | ICD-10-CM | POA: Diagnosis not present

## 2016-07-24 DIAGNOSIS — Z45018 Encounter for adjustment and management of other part of cardiac pacemaker: Secondary | ICD-10-CM | POA: Diagnosis not present

## 2016-07-24 DIAGNOSIS — I10 Essential (primary) hypertension: Secondary | ICD-10-CM | POA: Diagnosis not present

## 2016-07-24 DIAGNOSIS — I495 Sick sinus syndrome: Secondary | ICD-10-CM | POA: Diagnosis not present

## 2016-07-26 ENCOUNTER — Encounter: Payer: Self-pay | Admitting: Pharmacist

## 2016-07-26 ENCOUNTER — Telehealth: Payer: Self-pay

## 2016-07-26 DIAGNOSIS — M797 Fibromyalgia: Secondary | ICD-10-CM

## 2016-07-26 DIAGNOSIS — D509 Iron deficiency anemia, unspecified: Secondary | ICD-10-CM

## 2016-07-26 DIAGNOSIS — M1990 Unspecified osteoarthritis, unspecified site: Secondary | ICD-10-CM

## 2016-07-26 NOTE — Telephone Encounter (Signed)
Had to stop PT due to Bursitis   Have finished with Dr Maureen Ralphs for this so want an order to resume PT next door please

## 2016-07-26 NOTE — Telephone Encounter (Signed)
Referral to Physical therapy  ordered.

## 2016-07-30 ENCOUNTER — Telehealth: Payer: Self-pay | Admitting: Family

## 2016-07-30 MED ORDER — POLYMYXIN B-TRIMETHOPRIM 10000-0.1 UNIT/ML-% OP SOLN: 1.0000 [drp] | OPHTHALMIC | 0 refills | Status: DC

## 2016-07-30 NOTE — Telephone Encounter (Signed)
Polytrim Prescription sent to pharmacy   

## 2016-07-30 NOTE — Telephone Encounter (Signed)
Patient states that she splashed dirty water from the [place] water. Patient states her eye is red, crusty and draining some. She states that you had advised if it did not get better you would send in antibiotic drop please advise

## 2016-07-30 NOTE — Telephone Encounter (Signed)
Pt notified of RX 

## 2016-08-02 ENCOUNTER — Ambulatory Visit (INDEPENDENT_AMBULATORY_CARE_PROVIDER_SITE_OTHER): Payer: Medicare Other | Admitting: Pharmacist

## 2016-08-02 DIAGNOSIS — I4821 Permanent atrial fibrillation: Secondary | ICD-10-CM

## 2016-08-02 DIAGNOSIS — I482 Chronic atrial fibrillation, unspecified: Secondary | ICD-10-CM

## 2016-08-02 NOTE — Patient Instructions (Signed)
Anticoagulation Warfarin Dose Instructions as of 08/02/2016      Angela Wong Tue Wed Thu Fri Sat   New Dose 2 mg 4 mg 2 mg 4 mg 2 mg 4 mg 2 mg    Description   Change warfarin dose to 4mg  on Mondays, Wednesdays and Fridays. Take 2mg  all other days.  INR was 1.9 today (goal is 2.0 to 3.0)

## 2016-08-05 LAB — COAGUCHEK XS/INR WAIVED
INR: 1.9 — ABNORMAL HIGH (ref 0.9–1.1)
PROTHROMBIN TIME: 22.5 s

## 2016-08-08 ENCOUNTER — Ambulatory Visit: Payer: Medicare Other | Attending: Family | Admitting: Physical Therapy

## 2016-08-08 ENCOUNTER — Encounter: Payer: Self-pay | Admitting: Physical Therapy

## 2016-08-08 DIAGNOSIS — M6281 Muscle weakness (generalized): Secondary | ICD-10-CM | POA: Insufficient documentation

## 2016-08-08 DIAGNOSIS — R293 Abnormal posture: Secondary | ICD-10-CM | POA: Insufficient documentation

## 2016-08-08 NOTE — Therapy (Signed)
[facility] [place], [place], [address] Phone: [phone]   Fax:  [phone]  Physical Therapy Treatment  Patient Details  Name: Angela Wong MRN: [id number] Date of Birth: December 10, 1940 Referring Provider: [name].  Encounter Date: 08/08/2016      PT End of Session - 08/08/16 1149    Visit Number 1   Number of Visits 16   Date for PT Re-Evaluation 10/07/16   Authorization Type FOTO every 10th visit.   PT Start Time 1041   PT Stop Time 1122   PT Time Calculation (min) 41 min   Activity Tolerance Patient tolerated treatment well;Patient limited by pain   Behavior During Therapy [facility] for tasks assessed/performed      Past Medical History:  Diagnosis Date  . A-fib ([place])   . Asthma   . Cerebral vasculitis   . COPD (chronic obstructive pulmonary disease) ([place])   . Fibromyalgia   . GERD (gastroesophageal reflux disease)   . IBS (irritable bowel syndrome)   . MVP (mitral valve prolapse)   . Osteoarthritis   . Ovarian cancer ([place])    lymph node removal with hysterectomy  . Raynaud's disease   . RLS (restless legs syndrome)   . Sjogren's syndrome [facility])     Past Surgical History:  Procedure Laterality Date  . ABDOMINAL HYSTERECTOMY    . APPENDECTOMY    . BREAST SURGERY     Biopsy  . CATARACT EXTRACTION Left   . PACEMAKER INSERTION  09/15/2014    There were no vitals filed for this visit.      Subjective Assessment - 08/08/16 1127    Subjective The patient returns to physical therapy with c/o of pain "everywhere".  She is discouraged as she feels she is staedily declining physically.  She has ongoing c/o left hip and knee pain and is considering knee surgery this year.  Her goal in therapy is to walk upright as she slumps by giving into the pain.     Pertinent History Fibromyalgia.  OA.  Pacemaker.   Limitations Walking;Sitting;Standing   How long can you sit comfortably? 5-10 minutes.   How long  can you stand comfortably? 5-10 minutes.   How long can you walk comfortably? Very short distances.   Patient Stated Goals See above.   Currently in Pain? Yes   Pain Score 6    Pain Location --  Multiple joints especially left knee and hip.   Pain Descriptors / Indicators Aching;Throbbing;Shooting;Sharp   Pain Type Chronic pain   Pain Onset More than a month ago   Pain Frequency Constant   Aggravating Factors  See above.   Pain Relieving Factors See above.            [facility] PT Assessment - 08/08/16 0001      Assessment   Medical Diagnosis Osteoarthritis.   Referring Provider [name].   Onset Date/Surgical Date --  Ongoing.     Precautions   Precautions Fall  PACEMAKER.     Restrictions   Weight Bearing Restrictions No     Balance Screen   Has the patient fallen in the past 6 months No   Has the patient had a decrease in activity level because of a fear of falling?  Yes   Is the patient reluctant to leave their home because of a fear of falling?  No     Home Ecologist residence     Prior Function  Level of Independence Independent     Posture/Postural Control   Posture/Postural Control Postural limitations   Postural Limitations Rounded Shoulders;Forward head;Decreased lumbar lordosis;Increased thoracic kyphosis;Flexed trunk;Weight shift right     AROM   Overall AROM Comments Functional active ROM for bilateral U and LE'.  However, she has lost some left knee extension (-10) since last seen and her left knee is visbly swollen and tender to touch.     Strength   Overall Strength Comments Bilateral hip abduction weakness graded at 4-/5; bilateral knee strength= 4-/5 with pain reported.       Palpation   Palpation comment C/o diffuse spinal pain especially in lumbar region and reports of pain "everywhere" when referring to her shoulders and hips.       Ambulation/Gait   Gait Pattern Decreased step length - right;Decreased  step length - left;Trunk flexed   Gait Comments Patient ambulates with a straight cane and also holds her left knee somewhat flexed due to pain.                               PT Short Term Goals - 04/29/16 1111      PT SHORT TERM GOAL #1   Title STG's=LTG's.           PT Long Term Goals - 08/08/16 1158      PT LONG TERM GOAL #1   Title Independent with HEP.   Time 8   Period Weeks   Status New     PT LONG TERM GOAL #2   Title Patient instructed in correct posture and able to verbalize.   Time 8   Period Weeks   Status New     PT LONG TERM GOAL #3   Title Patient walk in upright posture.   Time 8   Period Weeks   Status New     PT LONG TERM GOAL #4   Title Perform ADL's with pain not > 3-4/10 in spine.   Time 8   Period Weeks   Status New               Plan - 08/08/16 1150    Clinical Impression Statement The patient presents with c/o pain "everywhere."  She has significant postural dysfunction and impaired functional mobility.  Patient stands in spinal and left knee flexion in response to pain and a decreased ability to activet her core musculature.  She is globally weak over the major muscle groups of her LE's.  She can only sit, stand and walk for very short amounts of time.  Her deficits have been evolving.  She is planned for left knee injections and probable surgery later this year.     History and Personal Factors relevant to plan of care: OA.  Fibromyalgia.  Pacemaker.   Clinical Presentation Evolving   Clinical Presentation due to: Worsening pain and progressive weakness and fatigue.   Clinical Decision Making Moderate   Rehab Potential Fair   PT Frequency 2x / week   PT Duration 8 weeks   PT Treatment/Interventions Moist Heat;ADLs/Self Care Home Management;Functional mobility training;Gait training;Therapeutic activities;Therapeutic exercise;Balance training;Neuromuscular re-education;Patient/family education;Manual techniques    PT Next Visit Plan Level 1 Nustep to begin.  NO STATIONARY BIKE.  Draw-in instruct in supine, seated and standing.  Low-level core exercises and general conditioning with NO pain increase.  NO weight machines.  Exercise must be low impact.   Consulted and Agree with  Plan of Care Patient      Patient will benefit from skilled therapeutic intervention in order to improve the following deficits and impairments:  Abnormal gait, Decreased activity tolerance, Decreased mobility, Decreased range of motion, Decreased strength, Increased edema, Difficulty walking, Postural dysfunction, Pain  Visit Diagnosis: Abnormal posture - Plan: PT plan of care cert/re-cert  Muscle weakness (generalized) - Plan: PT plan of care cert/re-cert       G-Codes - 08-30-16 1141    Functional Assessment Tool Used (Outpatient Only) Clinical judgement.   Functional Limitation Mobility: Walking and moving around   Mobility: Walking and Moving Around Current Status [phone]) At least 60 percent but less than 80 percent impaired, limited or restricted   Mobility: Walking and Moving Around Goal Status [phone]) At least 20 percent but less than 40 percent impaired, limited or restricted      Problem List Patient Active Problem List   Diagnosis Date Noted  . Steroid-induced osteopenia 07/11/2016  . Hyperparathyroidism ([place]) 01/16/2016  . Iron deficiency anemia 12/18/2015  . Insomnia 12/18/2015  . Hypothyroidism 12/18/2015  . Rotator cuff tear 03/23/2014  . Osteoarthritis   . Asthma   . Ovarian cancer ([place])   . COPD (chronic obstructive pulmonary disease) ([place])   . Fibromyalgia   . RLS (restless legs syndrome)   . Cerebral vasculitis   . Sjogren's syndrome ([place])   . Raynaud's disease   . MVP (mitral valve prolapse)   . IBS (irritable bowel syndrome)   . Congestive dilated cardiomyopathy ([place]) 02/09/2014  . Dyspnea 12/13/2013  . Hyperlipidemia 11/26/2013  . Malaise and fatigue 11/26/2013  . Multiple drug  allergies 11/02/2013  . History of ovarian cancer 10/01/2013  . GERD (gastroesophageal reflux disease) 10/01/2013  . Benign essential HTN 10/01/2013  . Atrial fibrillation, permanent ([place]) 07/27/2013    Ruben Mahler, [place] MPT 08-30-2016, 12:54 PM  [facility] [address] [place], [place], [address] Phone: [phone]   Fax:  [phone]  Name: Angela Wong MRN: [id number] Date of Birth: 03/10/1940

## 2016-08-12 ENCOUNTER — Ambulatory Visit (INDEPENDENT_AMBULATORY_CARE_PROVIDER_SITE_OTHER): Payer: Medicare Other | Admitting: Family

## 2016-08-12 ENCOUNTER — Encounter: Payer: Medicare Other | Admitting: Physical Therapy

## 2016-08-12 ENCOUNTER — Telehealth: Payer: Self-pay | Admitting: Family

## 2016-08-12 ENCOUNTER — Encounter: Payer: Self-pay | Admitting: Family

## 2016-08-12 VITALS — BP 117/73 | HR 78 | Temp 97.6°F | Ht 64.0 in | Wt 140.2 lb

## 2016-08-12 DIAGNOSIS — H1013 Acute atopic conjunctivitis, bilateral: Secondary | ICD-10-CM

## 2016-08-12 MED ORDER — OLOPATADINE HCL 0.2 % OP SOLN: 1.0000 [drp] | Freq: Every day | OPHTHALMIC | 1 refills | Status: DC

## 2016-08-12 NOTE — Telephone Encounter (Signed)
What symptoms do you have? Eye itching, more matter on eye, runs alot  How long have you been sick? About two weeks  Have you been seen for this problem? No, Christy called in eye drops   If your provider decides to give you a prescription, which pharmacy would you like for it to be sent to? [facility]   Patient informed that this information will be sent to the clinical staff for review and that they should receive a follow up call.

## 2016-08-12 NOTE — Telephone Encounter (Signed)
Pt having continued drainage from eye appt scheduled for evaluation

## 2016-08-12 NOTE — Progress Notes (Signed)
   Subjective:    Patient ID: Angela Wong, female    DOB: July 16, 1940, 76 y.o.   MRN: [id number]  Eye Pain   The right eye is affected. This is a recurrent problem. The current episode started more than 1 month ago. The problem occurs intermittently. The problem has been gradually worsening. There was no injury mechanism. The pain is at a severity of 3/10. The pain is mild. She does not wear contacts. Associated symptoms include blurred vision, an eye discharge and eye redness. Pertinent negatives include no double vision, fever, nausea or vomiting. She has tried eye drops and commercial eye wash for the symptoms. The treatment provided mild relief.      Review of Systems  Constitutional: Negative for fever.  Eyes: Positive for blurred vision, pain, discharge and redness. Negative for double vision.  Gastrointestinal: Negative for nausea and vomiting.  All other systems reviewed and are negative.      Objective:   Physical Exam  Constitutional: She is oriented to person, place, and time. She appears well-developed and well-nourished. No distress.  HENT:  Head: Normocephalic and atraumatic.  Right Ear: External ear normal.  Mouth/Throat: Oropharynx is clear and moist.  Eyes: Pupils are equal, round, and reactive to light.  Cardiovascular: Normal rate, regular rhythm, normal heart sounds and intact distal pulses.   No murmur heard. Pulmonary/Chest: Effort normal and breath sounds normal. No respiratory distress. She has no wheezes.  Abdominal: Soft. Bowel sounds are normal. She exhibits no distension. There is no tenderness.  Musculoskeletal: Normal range of motion. She exhibits no edema or tenderness.  Neurological: She is alert and oriented to person, place, and time.  Skin: Skin is warm and dry.  Psychiatric: She has a normal mood and affect. Her behavior is normal. Judgment and thought content normal.  Vitals reviewed.    BP 117/73   Pulse 78   Temp 97.6 F (36.4 C)  (Oral)   Ht 5\' 4"  (1.626 m)   Wt 140 lb 3.2 oz (63.6 kg)   BMI 24.07 kg/m      Assessment & Plan:  1. Allergic conjunctivitis of both eyes Continue allegra & Singulair Pt states she is allergic to mold and has states she has this in her hom DO not rub eye RTO prn and keep chronic follow up - Olopatadine HCl (PATADAY) 0.2 % SOLN; Apply 1 drop to eye daily.  Dispense: 2.5 mL; Refill: 1 - Ambulatory referral to Allergy - Ambulatory referral to Ophthalmology    [name], FNP

## 2016-08-12 NOTE — Patient Instructions (Signed)
Allergic Conjunctivitis, Adult      Allergic conjunctivitis is inflammation of the clear membrane that covers the white part of your eye and the inner surface of your eyelid (conjunctiva). The inflammation is caused by allergies. The blood vessels in the conjunctiva become inflamed and this causes the eyes to become red or pink. The eyes often feel itchy. Allergic conjunctivitis cannot be spread from one person to another person (is not contagious). What are the causes? This condition is caused by an allergic reaction. Common causes of an allergic reaction (allergens) include:  Outdoor allergens, such as: ? Pollen. ? Grass and weeds. ? Mold spores.  Indoor allergens, such as: ? Dust. ? Smoke. ? Mold. ? Pet dander. ? Animal hair.  What increases the risk? You may be more likely to develop this condition if you have a family history of allergies, such as:  Allergic rhinitis.  Bronchial asthma.  Atopic dermatitis.  What are the signs or symptoms? Symptoms of this condition include eyes that are:  Itchy.  Red.  Watery.  Puffy.  Your eyes may also:  Sting or burn.  Have clear drainage coming from them.  How is this diagnosed? This condition may be diagnosed by medical history and physical exam. If you have drainage from your eyes, it may be tested to rule out other causes of conjunctivitis. You may also need to see a health care provider who specializes in treating allergies (allergist) or eye conditions (ophthalmologist) for tests to confirm the diagnosis. You may have:  Skin tests to see which allergens are causing your symptoms. These tests involve pricking the skin with a tiny needle and exposing the skin to small amounts of potential allergens to see if your skin reacts.  Blood tests.  Tissue scrapings from your eyelid. These will be examined under a microscope.  How is this treated? Treatments for this condition may include:  Cold cloths (compresses) to  soothe itching and swelling.  Washing the face to remove allergens.  Eye drops. These may be prescription or over-the-counter. There are several different types. You may need to try different types to see which one works best for you. Your may need: ? Eye drops that block the allergic reaction (antihistamine). ? Eye drops that reduce swelling and irritation (anti-inflammatory). ? Steroid eye drops to lessen a severe reaction (vernal conjunctivitis).  Oral antihistamine medicines to reduce your allergic reaction. You may need these if eye drops do not help or are difficult to use.  Follow these instructions at home:  Avoid known allergens whenever possible.  Take or apply over-the-counter and prescription medicines only as told by your health care provider. These include any eye drops.  Apply a cool, clean washcloth to your eye for 10-20 minutes, 3-4 times a day.  Do not touch or rub your eyes.  Do not wear contact lenses until the inflammation is gone. Wear glasses instead.  Do not wear eye makeup until the inflammation is gone.  Keep all follow-up visits as told by your health care provider. This is important. Contact a health care provider if:  Your symptoms get worse or do not improve with treatment.  You have mild eye pain.  You have sensitivity to light.  You have spots or blisters on your eyes.  You have pus draining from your eye.  You have a fever. Get help right away if:  You have redness, swelling, or other symptoms in only one eye.  Your vision is blurred or you have   vision changes.  You have severe eye pain. This information is not intended to replace advice given to you by your health care provider. Make sure you discuss any questions you have with your health care provider. Document Released: 03/23/2002 Document Revised: 08/30/2015 Document Reviewed: 07/14/2015 Elsevier Interactive Patient Education  2018 Elsevier Inc.  

## 2016-08-15 ENCOUNTER — Ambulatory Visit: Payer: Medicare Other | Admitting: Physical Therapy

## 2016-08-16 ENCOUNTER — Ambulatory Visit (INDEPENDENT_AMBULATORY_CARE_PROVIDER_SITE_OTHER): Payer: Medicare Other | Admitting: Pharmacist Clinician (PhC)/ Clinical Pharmacy Specialist

## 2016-08-16 DIAGNOSIS — H04001 Unspecified dacryoadenitis, right lacrimal gland: Secondary | ICD-10-CM | POA: Diagnosis not present

## 2016-08-16 DIAGNOSIS — I482 Chronic atrial fibrillation, unspecified: Secondary | ICD-10-CM

## 2016-08-16 DIAGNOSIS — I4821 Permanent atrial fibrillation: Secondary | ICD-10-CM

## 2016-08-16 DIAGNOSIS — H04123 Dry eye syndrome of bilateral lacrimal glands: Secondary | ICD-10-CM | POA: Diagnosis not present

## 2016-08-16 DIAGNOSIS — H0289 Other specified disorders of eyelid: Secondary | ICD-10-CM | POA: Diagnosis not present

## 2016-08-16 DIAGNOSIS — M3501 Sicca syndrome with keratoconjunctivitis: Secondary | ICD-10-CM | POA: Diagnosis not present

## 2016-08-16 LAB — COAGUCHEK XS/INR WAIVED
INR: 2.2 — AB (ref 0.9–1.1)
PROTHROMBIN TIME: 26.3 s

## 2016-08-16 NOTE — Patient Instructions (Signed)
Anticoagulation Warfarin Dose Instructions as of 08/16/2016      Angela Wong Tue Wed Thu Fri Sat   New Dose 2 mg 4 mg 2 mg 4 mg 2 mg 4 mg 2 mg    Description   Change warfarin dose to 4mg  on Mondays, Wednesdays and Fridays. Take 2mg  all other days.  INR was 2.2 today (goal is 2.0 to 3.0)

## 2016-08-19 ENCOUNTER — Ambulatory Visit: Payer: Medicare Other | Attending: Family | Admitting: Physical Therapy

## 2016-08-19 ENCOUNTER — Encounter: Payer: Self-pay | Admitting: Physical Therapy

## 2016-08-19 DIAGNOSIS — M6281 Muscle weakness (generalized): Secondary | ICD-10-CM | POA: Diagnosis not present

## 2016-08-19 DIAGNOSIS — R293 Abnormal posture: Secondary | ICD-10-CM

## 2016-08-19 DIAGNOSIS — M25611 Stiffness of right shoulder, not elsewhere classified: Secondary | ICD-10-CM | POA: Insufficient documentation

## 2016-08-19 NOTE — Therapy (Signed)
[facility] [place], [place], [address] Phone: [phone]   Fax:  [phone]  Physical Therapy Treatment  Patient Details  Name: Angela Wong MRN: [id number] Date of Birth: 1940-07-15 Referring Provider: [name].  Encounter Date: 08/19/2016      PT End of Session - 08/19/16 1001    Visit Number 2   Number of Visits 16   Date for PT Re-Evaluation 10/07/16   Authorization Type FOTO every 10th visit.   PT Start Time 0945   PT Stop Time 1025   PT Time Calculation (min) 40 min   Activity Tolerance Patient tolerated treatment well   Behavior During Therapy WFL for tasks assessed/performed      Past Medical History:  Diagnosis Date  . A-fib ([place])   . Asthma   . Cerebral vasculitis   . COPD (chronic obstructive pulmonary disease) ([place])   . Fibromyalgia   . GERD (gastroesophageal reflux disease)   . IBS (irritable bowel syndrome)   . MVP (mitral valve prolapse)   . Osteoarthritis   . Ovarian cancer ([place])    lymph node removal with hysterectomy  . Raynaud's disease   . RLS (restless legs syndrome)   . Sjogren's syndrome [facility])     Past Surgical History:  Procedure Laterality Date  . ABDOMINAL HYSTERECTOMY    . APPENDECTOMY    . BREAST SURGERY     Biopsy  . CATARACT EXTRACTION Left   . PACEMAKER INSERTION  09/15/2014    There were no vitals filed for this visit.      Subjective Assessment - 08/19/16 0949    Subjective Patient reported increased in left knee and back left side after picking up 80# dog who is old and ill and unable to get up by itself   Pertinent History Fibromyalgia.  OA.  Pacemaker.   Limitations Walking;Sitting;Standing   How long can you sit comfortably? 5-10 minutes.   How long can you stand comfortably? 5-10 minutes.   How long can you walk comfortably? Very short distances.   Currently in Pain? Yes   Pain Score 8    Pain Location --  left knee, hip and low back   Pain  Orientation Left   Pain Descriptors / Indicators Aching;Discomfort;Sore   Pain Type Chronic pain   Pain Onset More than a month ago   Pain Frequency Constant   Aggravating Factors  prolong activitiy   Pain Relieving Factors at rest                         [facility] Adult PT Treatment/Exercise - 08/19/16 0001      Exercises   Exercises Lumbar;Knee/Hip     Lumbar Exercises: Standing   Other Standing Lumbar Exercises red swiss ball for UE push downs for core activation 5sec x20     Lumbar Exercises: Seated   Other Seated Lumbar Exercises seated for "W","V" using half blue roll for tactile cues 2x10 each     Lumbar Exercises: Supine   Ab Set 20 reps;3 seconds   Glut Set 20 reps;3 seconds   Clam 20 reps;3 seconds  with green t-band for resistance   Bent Knee Raise 20 reps;3 seconds   Bridge 10 reps;3 seconds     Knee/Hip Exercises: Aerobic   Nustep L1 x 48min, UE/LE activity     Knee/Hip Exercises: Supine   Hip Adduction Isometric Strengthening;10 reps  grey ball squeeze 10sec holds  PT Long Term Goals - 08/19/16 1004      PT LONG TERM GOAL #1   Title Independent with HEP.   Time 8   Period Weeks   Status On-going     PT LONG TERM GOAL #2   Title Patient instructed in correct posture and able to verbalize.   Time 8   Period Weeks   Status On-going     PT LONG TERM GOAL #3   Title Patient walk in upright posture.   Time 8   Period Weeks   Status On-going     PT LONG TERM GOAL #4   Title Perform ADL's with pain not > 3-4/10 in spine.   Time 8   Period Weeks   Status On-going               Plan - 08/19/16 1024    Clinical Impression Statement Patient tolerated treatment well today. Patient able to complete all exercises with educational cues for technique and pace. Patient has ongoing pain with any prolong activity or bending, lifting and heavy activity. Educated patient on posture techniques and core  activation today. Patient current goals ongoing due to pain and activity tolerance limitations.    Rehab Potential Fair   PT Frequency 2x / week   PT Duration 8 weeks   PT Treatment/Interventions Moist Heat;ADLs/Self Care Home Management;Functional mobility training;Gait training;Therapeutic activities;Therapeutic exercise;Balance training;Neuromuscular re-education;Patient/family education;Manual techniques   PT Next Visit Plan cont with POC per PT for level 1 Nustep to begin.  NO STATIONARY BIKE.  Draw-in instruct in supine, seated and standing.  Low-level core exercises and general conditioning with NO pain increase.  NO weight machines.  Exercise must be low impact.   Consulted and Agree with Plan of Care Patient      Patient will benefit from skilled therapeutic intervention in order to improve the following deficits and impairments:  Abnormal gait, Decreased activity tolerance, Decreased mobility, Decreased range of motion, Decreased strength, Increased edema, Difficulty walking, Postural dysfunction, Pain  Visit Diagnosis: Abnormal posture  Muscle weakness (generalized)     Problem List Patient Active Problem List   Diagnosis Date Noted  . Steroid-induced osteopenia 07/11/2016  . Hyperparathyroidism ([place]) 01/16/2016  . Iron deficiency anemia 12/18/2015  . Insomnia 12/18/2015  . Hypothyroidism 12/18/2015  . Rotator cuff tear 03/23/2014  . Osteoarthritis   . Asthma   . Ovarian cancer ([place])   . COPD (chronic obstructive pulmonary disease) ([place])   . Fibromyalgia   . RLS (restless legs syndrome)   . Cerebral vasculitis   . Sjogren's syndrome ([place])   . Raynaud's disease   . MVP (mitral valve prolapse)   . IBS (irritable bowel syndrome)   . Congestive dilated cardiomyopathy ([place]) 02/09/2014  . Dyspnea 12/13/2013  . Hyperlipidemia 11/26/2013  . Malaise and fatigue 11/26/2013  . Multiple drug allergies 11/02/2013  . History of ovarian cancer 10/01/2013  . GERD  (gastroesophageal reflux disease) 10/01/2013  . Benign essential HTN 10/01/2013  . Atrial fibrillation, permanent ([place]) 07/27/2013    Angela Wong, PTA 08/19/2016, 10:27 AM  [facility] [place], [place], [address] Phone: [phone]   Fax:  [phone]  Name: BURNETTE VALENTI MRN: [id number] Date of Birth: 1940/09/23

## 2016-08-21 ENCOUNTER — Encounter: Payer: Self-pay | Admitting: *Deleted

## 2016-08-22 ENCOUNTER — Encounter: Payer: Self-pay | Admitting: Physical Therapy

## 2016-08-22 ENCOUNTER — Ambulatory Visit: Payer: Medicare Other | Admitting: Physical Therapy

## 2016-08-22 DIAGNOSIS — M6281 Muscle weakness (generalized): Secondary | ICD-10-CM

## 2016-08-22 DIAGNOSIS — M25611 Stiffness of right shoulder, not elsewhere classified: Secondary | ICD-10-CM | POA: Diagnosis not present

## 2016-08-22 DIAGNOSIS — R293 Abnormal posture: Secondary | ICD-10-CM

## 2016-08-22 NOTE — Therapy (Signed)
[facility] [place], [place], [address] Phone: [phone]   Fax:  [phone]  Physical Therapy Treatment  Patient Details  Name: Angela Wong MRN: [id number] Date of Birth: 1940/09/20 Referring Provider: [name].  Encounter Date: 08/22/2016      PT End of Session - 08/22/16 1021    Visit Number 3   Number of Visits 16   Date for PT Re-Evaluation 10/07/16   Authorization Type FOTO every 10th visit.   PT Start Time [phone]   PT Stop Time 1025   PT Time Calculation (min) 41 min   Activity Tolerance Patient tolerated treatment well;Patient limited by fatigue   Behavior During Therapy [facility] for tasks assessed/performed      Past Medical History:  Diagnosis Date  . A-fib ([place])   . Asthma   . Cerebral vasculitis   . COPD (chronic obstructive pulmonary disease) ([place])   . Fibromyalgia   . GERD (gastroesophageal reflux disease)   . Hyperparathyroidism ([place])   . IBS (irritable bowel syndrome)   . MVP (mitral valve prolapse)   . Osteoarthritis   . Ovarian cancer ([place])    lymph node removal with hysterectomy  . Raynaud's disease   . RLS (restless legs syndrome)   . Sjogren's syndrome ([place])   . Vitamin D deficiency     Past Surgical History:  Procedure Laterality Date  . ABDOMINAL HYSTERECTOMY    . APPENDECTOMY    . BREAST SURGERY     Biopsy  . CATARACT EXTRACTION Left   . PACEMAKER INSERTION  09/15/2014    There were no vitals filed for this visit.      Subjective Assessment - 08/22/16 0954    Subjective Patient reported doing fairly well after last treatment with muscle soreness   Pertinent History Fibromyalgia.  OA.  Pacemaker.   Limitations Walking;Sitting;Standing   How long can you sit comfortably? 5-10 minutes.   How long can you stand comfortably? 5-10 minutes.   How long can you walk comfortably? Very short distances.   Currently in Pain? Yes   Pain Score 5    Pain Location --  knee, hip  and back   Pain Descriptors / Indicators Discomfort;Aching;Sore   Pain Type Chronic pain   Pain Onset More than a month ago   Pain Frequency Constant   Aggravating Factors  prolong activity   Pain Relieving Factors at rest                         [facility] Adult PT Treatment/Exercise - 08/22/16 0001      Lumbar Exercises: Standing   Other Standing Lumbar Exercises standing lat pull for core activation 2x10     Lumbar Exercises: Seated   Other Seated Lumbar Exercises seated for "W","V" using half blue roll for tactile cues 2x10 each     Knee/Hip Exercises: Aerobic   Nustep L1 x 8 1/2 min, UE/LE activity     Knee/Hip Exercises: Standing   Heel Raises Both;1 set;10 reps   Forward Step Up Both;2 sets;10 reps   Rocker Board 2 minutes   Rocker Board Limitations balance and calf stretch     Knee/Hip Exercises: Supine   Hip Adduction Isometric Strengthening;20 reps  grey ball 10sec holds seated   Other Supine Knee/Hip Exercises seated clam with yellow t-bandx30                     PT Long Term  Goals - 08/19/16 1004      PT LONG TERM GOAL #1   Title Independent with HEP.   Time 8   Period Weeks   Status On-going     PT LONG TERM GOAL #2   Title Patient instructed in correct posture and able to verbalize.   Time 8   Period Weeks   Status On-going     PT LONG TERM GOAL #3   Title Patient walk in upright posture.   Time 8   Period Weeks   Status On-going     PT LONG TERM GOAL #4   Title Perform ADL's with pain not > 3-4/10 in spine.   Time 8   Period Weeks   Status On-going               Plan - 08/22/16 1029    Clinical Impression Statement Patient tolerated treatment fairly well today. Patient reported fatigue and some pain today. Patient able to perform standing exercises with good tolerance and required minimal rest break. Patient progressing toward goals yet ongoing due to pain and activity tolerance limitations.    Rehab  Potential Fair   PT Frequency 2x / week   PT Duration 8 weeks   PT Treatment/Interventions Moist Heat;ADLs/Self Care Home Management;Functional mobility training;Gait training;Therapeutic activities;Therapeutic exercise;Balance training;Neuromuscular re-education;Patient/family education;Manual techniques   PT Next Visit Plan cont with POC per PT for level 1 Nustep to begin.  NO STATIONARY BIKE.  Draw-in instruct in supine, seated and standing.  Low-level core exercises and general conditioning with NO pain increase.  NO weight machines.  Exercise must be low impact.   Consulted and Agree with Plan of Care Patient      Patient will benefit from skilled therapeutic intervention in order to improve the following deficits and impairments:  Abnormal gait, Decreased activity tolerance, Decreased mobility, Decreased range of motion, Decreased strength, Increased edema, Difficulty walking, Postural dysfunction, Pain  Visit Diagnosis: Abnormal posture  Muscle weakness (generalized)  Shoulder stiffness, right     Problem List Patient Active Problem List   Diagnosis Date Noted  . Steroid-induced osteopenia 07/11/2016  . Hyperparathyroidism ([place]) 01/16/2016  . Iron deficiency anemia 12/18/2015  . Insomnia 12/18/2015  . Hypothyroidism 12/18/2015  . Rotator cuff tear 03/23/2014  . Osteoarthritis   . Asthma   . Ovarian cancer ([place])   . COPD (chronic obstructive pulmonary disease) ([place])   . Fibromyalgia   . RLS (restless legs syndrome)   . Cerebral vasculitis   . Sjogren's syndrome ([place])   . Raynaud's disease   . MVP (mitral valve prolapse)   . IBS (irritable bowel syndrome)   . Congestive dilated cardiomyopathy ([place]) 02/09/2014  . Dyspnea 12/13/2013  . Hyperlipidemia 11/26/2013  . Malaise and fatigue 11/26/2013  . Multiple drug allergies 11/02/2013  . History of ovarian cancer 10/01/2013  . GERD (gastroesophageal reflux disease) 10/01/2013  . Benign essential HTN 10/01/2013  .  Atrial fibrillation, permanent ([place]) 07/27/2013    Smera Guyette P, PTA 08/22/2016, 10:36 AM  [facility] [place], [place], [address] Phone: [phone]   Fax:  [phone]  Name: Angela Wong MRN: [id number] Date of Birth: 1940-09-17

## 2016-08-28 ENCOUNTER — Ambulatory Visit: Payer: Medicare Other | Admitting: Physical Therapy

## 2016-08-28 DIAGNOSIS — M25611 Stiffness of right shoulder, not elsewhere classified: Secondary | ICD-10-CM

## 2016-08-28 DIAGNOSIS — M6281 Muscle weakness (generalized): Secondary | ICD-10-CM | POA: Diagnosis not present

## 2016-08-28 DIAGNOSIS — R293 Abnormal posture: Secondary | ICD-10-CM | POA: Diagnosis not present
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
## 2016-09-02 ENCOUNTER — Encounter: Payer: Self-pay | Admitting: Physical Therapy

## 2016-09-02 ENCOUNTER — Ambulatory Visit: Payer: Medicare Other | Admitting: Physical Therapy

## 2016-09-02 DIAGNOSIS — M6281 Muscle weakness (generalized): Secondary | ICD-10-CM | POA: Diagnosis not present

## 2016-09-02 DIAGNOSIS — R293 Abnormal posture: Secondary | ICD-10-CM | POA: Diagnosis not present

## 2016-09-02 DIAGNOSIS — M25611 Stiffness of right shoulder, not elsewhere classified: Secondary | ICD-10-CM | POA: Diagnosis not present

## 2016-09-02 NOTE — Patient Instructions (Signed)
  Knee Extension (Sitting)   Place __0-3__ pound weight on left ankle and straighten knee fully, lower slowly. Repeat _10___ times per set. Do __2-3__ sets per session. Do __2-3__ sessions per day.  Strengthening: Hip Abduction (Side-Lying)  Strengthening: Straight Leg Raise (Phase 1)  Repeat _10___ times per set. Do __2__ sets per session. Do __2__ sessions per day.     Bridging   Slowly raise buttocks from floor, keeping stomach tight. Repeat _10___ times per set. Do __2__ sets per session. Do __2__ sessions per day.   Straight Leg Raise   Tighten stomach and slowly raise locked right leg __4__ inches from floor. Repeat __10-30__ times per set. Do __2__ sets per session. Do __2__ sessions per day.     Stretch Break - Chin Tuck   Looking straight forward, tuck chin and hold __10__ seconds. Relax and return to starting position. Repeat __5-10__ times every _3-4___ hours.   Stretch Break - Chest and Shoulder Stretch   Maintaining erect posture, draw shoulders back while bringing elbows back and inward. Return to starting position. Repeat __10-20__ times every _3-4___ hours.   Half Squat to Chair   Stand with feet shoulder width apart. Push buttocks backward and lower slowly, sitting in chair lightly and returning to standing position. Complete _2_ sets of 10_ repetitions. Perform __2-3_ sessions per day.    Toe-Up (Ankle Plantar Flexion and Dorsiflexion)   Holding a stable object, rise up on toes. Hold ____ seconds. Then rock back on heels and Hold 2____ seconds. Repeat _5-10___ times. Do _1-2___ sessions per day.   High Stepping   Using support, lift knees, taking high steps. Repeat __5-10__ times. Do __1-2__ sessions per day.   Walk to the Side   Step to the side with stronger leg and follow with involved leg. Then return. Hold chair if necessary. Repeat __5-10__ times. Do _1-2___ sessions per day.

## 2016-09-02 NOTE — Therapy (Signed)
[facility] [place], [place], [address] Phone: [phone]   Fax:  [phone]  Physical Therapy Treatment  Patient Details  Name: Angela Wong MRN: [id number] Date of Birth: 1940/08/25 Referring Provider: [name].  Encounter Date: 09/02/2016      PT End of Session - 09/02/16 1331    Visit Number 5   Number of Visits 16   Date for PT Re-Evaluation 10/07/16   Authorization Type FOTO every 10th visit.   PT Start Time 1300   PT Stop Time 1329   PT Time Calculation (min) 29 min   Activity Tolerance Patient limited by fatigue;Patient limited by pain   Behavior During Therapy [facility] for tasks assessed/performed      Past Medical History:  Diagnosis Date  . A-fib ([place])   . Asthma   . Cerebral vasculitis   . COPD (chronic obstructive pulmonary disease) ([place])   . Fibromyalgia   . GERD (gastroesophageal reflux disease)   . Hyperparathyroidism ([place])   . IBS (irritable bowel syndrome)   . MVP (mitral valve prolapse)   . Osteoarthritis   . Ovarian cancer ([place])    lymph node removal with hysterectomy  . Raynaud's disease   . RLS (restless legs syndrome)   . Sjogren's syndrome ([place])   . Vitamin D deficiency     Past Surgical History:  Procedure Laterality Date  . ABDOMINAL HYSTERECTOMY    . APPENDECTOMY    . BREAST SURGERY     Biopsy  . CATARACT EXTRACTION Left   . PACEMAKER INSERTION  09/15/2014    There were no vitals filed for this visit.      Subjective Assessment - 09/02/16 1303    Subjective Patient reported doing all her house work and the basement flooded again causeing her more activity and more pain   Pertinent History Fibromyalgia.  OA.  Pacemaker.   Limitations Walking;Sitting;Standing   How long can you sit comfortably? 5-10 minutes.   How long can you stand comfortably? 5-10 minutes.   How long can you walk comfortably? Very short distances.   Patient Stated Goals See above.   Currently in Pain? Yes   Pain Score 7    Pain Location --  knee, hip, back   Pain Orientation Left   Pain Descriptors / Indicators Discomfort   Pain Type Chronic pain   Pain Onset More than a month ago   Pain Frequency Constant   Aggravating Factors  prolong activity   Pain Relieving Factors rest                         [facility] Adult PT Treatment/Exercise - 09/02/16 0001      Self-Care   Self-Care ADL's;Lifting;Posture   ADL's posture for cleaning bending and other activites   Lifting powerlift and using assistance with furniture   Posture all positions     Lumbar Exercises: Standing   Other Standing Lumbar Exercises standing lat pull for core activation 2x10     Lumbar Exercises: Seated   Other Seated Lumbar Exercises seated for "W","V" using half blue roll for tactile cues 2x10 each     Lumbar Exercises: Supine   Other Supine Lumbar Exercises seated lumbar ext with hald blue roll for tactile education 2x10     Knee/Hip Exercises: Aerobic   Nustep L1 x 8 min, UE/LE activity     Knee/Hip Exercises: Supine   Hip Adduction Isometric Strengthening;20 reps  2# ball  PT Education - 09/02/16 1322    Education provided Yes   Education Details HEP   Person(s) Educated Patient   Methods Explanation;Demonstration;Handout;Verbal cues   Comprehension Verbalized understanding;Returned demonstration             PT Long Term Goals - 09/02/16 1317      PT LONG TERM GOAL #1   Title Independent with HEP.   Time 8   Period Weeks   Status Achieved     PT LONG TERM GOAL #2   Title Patient instructed in correct posture and able to verbalize.   Time 8   Period Weeks   Status Achieved     PT LONG TERM GOAL #3   Title Patient walk in upright posture.   Time 8   Period Weeks   Status Not Met     PT LONG TERM GOAL #4   Title Perform ADL's with pain not > 3-4/10 in spine.   Time 8   Period Weeks   Status Not Met                Plan - 09/02/16 1331    Clinical Impression Statement Patient arrived with increased discomfort after her basement flooded and she performed all house cleaning activities which caused more pain all over. Patient was educated on posture awareness techniques and HEP given for strengtheing progression from supine to standing. Patient met LTG #1 and #2 other goals not met due to pain deficts.  Patient would like to DC to gym program.  Ended treatment early due to pain and fatigue per patient.    Rehab Potential Fair   PT Frequency 2x / week   PT Duration 8 weeks   PT Treatment/Interventions Moist Heat;ADLs/Self Care Home Management;Functional mobility training;Gait training;Therapeutic activities;Therapeutic exercise;Balance training;Neuromuscular re-education;Patient/family education;Manual techniques   PT Next Visit Plan DC   Consulted and Agree with Plan of Care Patient      Patient will benefit from skilled therapeutic intervention in order to improve the following deficits and impairments:  Abnormal gait, Decreased activity tolerance, Decreased mobility, Decreased range of motion, Decreased strength, Increased edema, Difficulty walking, Postural dysfunction, Pain  Visit Diagnosis: Abnormal posture  Muscle weakness (generalized)     Problem List Patient Active Problem List   Diagnosis Date Noted  . Steroid-induced osteopenia 07/11/2016  . Hyperparathyroidism ([place]) 01/16/2016  . Iron deficiency anemia 12/18/2015  . Insomnia 12/18/2015  . Hypothyroidism 12/18/2015  . Rotator cuff tear 03/23/2014  . Osteoarthritis   . Asthma   . Ovarian cancer ([place])   . COPD (chronic obstructive pulmonary disease) ([place])   . Fibromyalgia   . RLS (restless legs syndrome)   . Cerebral vasculitis   . Sjogren's syndrome ([place])   . Raynaud's disease   . MVP (mitral valve prolapse)   . IBS (irritable bowel syndrome)   . Congestive dilated cardiomyopathy ([place]) 02/09/2014  . Dyspnea 12/13/2013  .  Hyperlipidemia 11/26/2013  . Malaise and fatigue 11/26/2013  . Multiple drug allergies 11/02/2013  . History of ovarian cancer 10/01/2013  . GERD (gastroesophageal reflux disease) 10/01/2013  . Benign essential HTN 10/01/2013  . Atrial fibrillation, permanent ([place]) 07/27/2013    Ladean Raya, PTA 09/02/16 1:36 PM  [facility] [place], [place], [address] Phone: [phone]   Fax:  [phone]  Name: Angela Wong MRN: [id number] Date of Birth: 1940/04/19  PHYSICAL THERAPY DISCHARGE SUMMARY  Visits from Start of Care: 5.  Current functional level related  to goals / functional outcomes: See above.   Remaining deficits: 2 of 4 goals met.   Education / Equipment: HEP. Plan: Patient agrees to discharge.  Patient goals were partially met. Patient is being discharged due to being pleased with the current functional level.  ?????         Mali Applegate MPT

## 2016-09-04 ENCOUNTER — Encounter: Payer: Medicare Other | Admitting: Physical Therapy

## 2016-09-11 DIAGNOSIS — Z1231 Encounter for screening mammogram for malignant neoplasm of breast: Secondary | ICD-10-CM | POA: Diagnosis not present

## 2016-09-12 DIAGNOSIS — M1712 Unilateral primary osteoarthritis, left knee: Secondary | ICD-10-CM | POA: Diagnosis not present

## 2016-09-12 DIAGNOSIS — M7062 Trochanteric bursitis, left hip: Secondary | ICD-10-CM | POA: Diagnosis not present

## 2016-09-17 ENCOUNTER — Other Ambulatory Visit: Payer: Self-pay | Admitting: Family

## 2016-09-18 ENCOUNTER — Ambulatory Visit: Payer: Medicare Other | Admitting: Internal Medicine

## 2016-09-20 ENCOUNTER — Other Ambulatory Visit: Payer: Self-pay | Admitting: Family

## 2016-09-20 NOTE — Telephone Encounter (Signed)
Last seen 08/12/16

## 2016-09-21 ENCOUNTER — Other Ambulatory Visit: Payer: Self-pay | Admitting: Family

## 2016-09-23 ENCOUNTER — Encounter: Payer: Self-pay | Admitting: Internal Medicine

## 2016-09-23 ENCOUNTER — Ambulatory Visit (INDEPENDENT_AMBULATORY_CARE_PROVIDER_SITE_OTHER): Payer: Medicare Other | Admitting: Internal Medicine

## 2016-09-23 VITALS — BP 140/78 | HR 76 | Ht 64.0 in | Wt 140.2 lb

## 2016-09-23 DIAGNOSIS — R490 Dysphonia: Secondary | ICD-10-CM

## 2016-09-23 DIAGNOSIS — K589 Irritable bowel syndrome without diarrhea: Secondary | ICD-10-CM

## 2016-09-23 DIAGNOSIS — K219 Gastro-esophageal reflux disease without esophagitis: Secondary | ICD-10-CM | POA: Diagnosis not present

## 2016-09-23 DIAGNOSIS — Z86010 Personal history of colonic polyps: Secondary | ICD-10-CM

## 2016-09-23 DIAGNOSIS — M3500 Sicca syndrome, unspecified: Secondary | ICD-10-CM | POA: Diagnosis not present

## 2016-09-23 MED ORDER — ESOMEPRAZOLE MAGNESIUM 40 MG PO CPDR: 40.0000 mg | DELAYED_RELEASE_CAPSULE | Freq: Two times a day (BID) | ORAL | 1 refills | Status: DC

## 2016-09-23 MED ORDER — RANITIDINE HCL 150 MG PO TABS: ORAL_TABLET | ORAL | 1 refills | Status: DC

## 2016-09-23 NOTE — Progress Notes (Signed)
Patient ID: Angela Wong, female   DOB: 1940/09/28, 76 y.o.   MRN: [id number] HPI: Angela Wong is a 76 year old female with a complex medical history including atrial fibrillation on warfarin, COPD, Sjogren's syndrome, remote ovarian cancer status post hysterectomy and oophorectomy, hyperparathyroidism, fibromyalgia, restless leg, arthritis and from a GI perspective GERD, IBS and colon polyps who is seen in consultation at the request of Angela Dun, FNP to discuss reflux and hoarseness. She is here alone today.  She reports she has a long-standing history of reflux disease and hoarseness. She has previously used Nexium twice a day but is using once a day and ranitidine 150 at night. She reports the second dose is difficult for her to remember and so for multiple months she has been off of her second Nexium dose. Her hoarseness is worse with dry mouth and her Sjogren's syndrome. She also reports not always following prior strict instructions to avoid eating and drinking within 3 hours of lying down. She has regurgitation after eating particularly if she bends over or lifts objects. Symptoms exacerbated by chocolate, tomato sauce and coffee. She drinks multiple cups of coffee daily. She denies liquid and solid food dysphagia but does report liquids will "go down the wrong pipe" if she drinks too quickly. This will lead to coughing. This is not an issue if she eats and drinks slowly. She reports prior EGD on several occasions last in about 2009. Reports history of hiatal hernia but denies Barrett's esophagus.  Bowel movements she reports is regular though with her irritable bowel can be loose or slightly firm. She denies blood in her stool or melena. Denies abdominal pain. Denies change in bowel habit. Her last colonoscopy was in 2014 which is reviewed today. She reports being told that she has a "kinky":Marland Kitchen Colonoscopy 09/25/2012. Normal terminal ileum, multiple diverticuli in the sigmoid, descending and  transverse colon. 6 mm sessile polyp removed from the sigmoid with hot snare. All G not available today. 5 year recall was recommended per her recollection and prior documentation.  Her mother had colon polyps and her paternal uncle had colon cancer.  Past Medical History:  Diagnosis Date  . A-fib ([place])   . Asthma   . Cerebral vasculitis   . COPD (chronic obstructive pulmonary disease) ([place])   . Fibromyalgia   . GERD (gastroesophageal reflux disease)   . Hyperparathyroidism ([place])   . IBS (irritable bowel syndrome)   . MVP (mitral valve prolapse)   . Osteoarthritis   . Ovarian cancer ([place])    lymph node removal with hysterectomy  . Raynaud's disease   . RLS (restless legs syndrome)   . Sjogren's syndrome ([place])   . Vitamin D deficiency     Past Surgical History:  Procedure Laterality Date  . ABDOMINAL HYSTERECTOMY    . APPENDECTOMY    . BREAST SURGERY     Biopsy  . CATARACT EXTRACTION Left   . PACEMAKER INSERTION  09/15/2014    Outpatient Medications Prior to Visit  Medication Sig Dispense Refill  . albuterol (PROVENTIL HFA;VENTOLIN HFA) 108 (90 BASE) MCG/ACT inhaler Inhale 2 puffs into the lungs every 6 (six) hours as needed for wheezing.    Marland Kitchen ALPRAZolam (XANAX) 0.25 MG tablet Take 0.25 mg by mouth as needed.     Marland Kitchen atenolol (TENORMIN) 25 MG tablet Take 12.5 mg by mouth daily.  6  . B Complex-C (B-COMPLEX WITH VITAMIN C) tablet Take 1 tablet by mouth daily.    . bumetanide (BUMEX) 0.5  MG tablet Take 1 tablet (0.5 mg total) by mouth every morning. 90 tablet 1  . bumetanide (BUMEX) 1 MG tablet Take 1 tablet (1 mg total) by mouth every other day. On opposite day of 0.5mg  of bumetadnide 45 tablet 1  . Cholecalciferol (VITAMIN D) 2000 UNITS CAPS Take 2 capsules by mouth every evening.     . Cyanocobalamin (VITAMIN B 12 PO) Take 500 mg by mouth daily.    . cycloSPORINE (RESTASIS) 0.05 % ophthalmic emulsion Place 1 drop into both eyes 2 (two) times daily. 60 each 2  . docusate  sodium (COLACE) 100 MG capsule Take 400 mg by mouth daily.     Marland Kitchen esomeprazole (NEXIUM) 40 MG capsule Take 1 capsule (40 mg total) by mouth 2 (two) times daily before a meal. 180 capsule 1  . fexofenadine (ALLEGRA) 180 MG tablet Take 180 mg by mouth every morning.     Marland Kitchen levothyroxine (SYNTHROID, LEVOTHROID) 100 MCG tablet TAKE 1 TABLET BY MOUTH EVERY DAY 90 tablet 3  . losartan (COZAAR) 100 MG tablet TAKE 1 TABLET (100 MG TOTAL) BY MOUTH DAILY. 90 tablet 0  . Magnesium 200 MG TABS Take 1 tablet by mouth daily.    . montelukast (SINGULAIR) 10 MG tablet TAKE 1 TABLET BY MOUTH DAILY AT BEDTIME 90 tablet 2  . Olopatadine HCl (PATADAY) 0.2 % SOLN Apply 1 drop to eye daily. 2.5 mL 1  . oxycodone (OXY-IR) 5 MG capsule Take 1 capsule (5 mg total) by mouth every 4 (four) hours as needed. 30 capsule 0  . POLY-IRON 150 FORTE 150-25-1 MG-MCG-MG CAPS TAKE 1 CAPSULE BY MOUTH EVERY DAY 90 each 0  . predniSONE (DELTASONE) 5 MG tablet TAKE 1 TABLET BY MOUTH EVERY DAY 90 tablet 0  . PREVIDENT 5000 DRY MOUTH 1.1 % GEL dental gel Place 1 application onto teeth as needed (as needed for dry mouth).     . ranitidine (ZANTAC) 150 MG tablet One at bedtime 90 tablet 1  . Simethicone (GAS-X EXTRA STRENGTH) 125 MG CAPS Take 1 capsule by mouth daily as needed (for relief).    . SYMBICORT 160-4.5 MCG/ACT inhaler Inhale 2 puffs into the lungs 2 (two) times daily.  5  . traZODone (DESYREL) 150 MG tablet Take 1 tablet (150 mg total) by mouth at bedtime. 90 tablet 0  . warfarin (COUMADIN) 1 MG tablet TAKE 2 TO 4 TABLETS (= 2 TO 4MG ) BY MOUTH ONCE DAILY AS DIRECTED BY ANTICOGULATION CLINIC. 350 tablet 0  . trimethoprim-polymyxin b (POLYTRIM) ophthalmic solution Place 1 drop into the right eye every 4 (four) hours. 10 mL 0   No facility-administered medications prior to visit.     Allergies  Allergen Reactions  . Cephalosporins Hives and Shortness Of Breath  . Ciprofloxacin Hives and Shortness Of Breath  . Doxycycline Hives  and Shortness Of Breath  . Horse-Derived Products Anaphylaxis  . Ketek [Telithromycin] Palpitations    Chest discomfort  . Nitrofuran Derivatives Anaphylaxis and Hives  . Nitrous Oxide Nausea And Vomiting    Severe due to Sjogrens (Auto-Immune Disease)  . Other Anaphylaxis    ALLERGY TO HORSE SERUM  . Penicillins Hives and Shortness Of Breath  . Sulfa Antibiotics Hives and Shortness Of Breath  . Talwin [Pentazocine] Other (See Comments)    Other reaction(s): Mental Status Changes (intolerance) Altered Mental Status  Altered Mental Status   . Trovan [Alatrofloxacin] Palpitations, Other (See Comments) and Anaphylaxis    Chest pain, dizziness, irregular pulse  .  Amlodipine Swelling  . Calcium Channel Blockers     Respiratory distress  . Carvedilol Other (See Comments)    dizziness  . Codeine Nausea Only  . Cymbalta [Duloxetine Hcl] Swelling  . Diltiazem     Swollen throat   . Diovan [Valsartan] Swelling  . Lisinopril Swelling  . Tramadol Nausea Only  . Zoloft [Sertraline Hcl]   . Loteprednol Etabonate Rash    Family History  Problem Relation Age of Onset  . COPD Mother   . Breast cancer Mother   . Heart disease Father        No details  . Kidney disease Father   . Stroke Sister   . Arthritis/Rheumatoid Sister   . Stroke Paternal Grandmother   . Scleroderma Grandchild   . Thyroid disease Other   . Breast cancer Maternal Aunt        x 2    Social History  Substance Use Topics  . Smoking status: Never Smoker  . Smokeless tobacco: Never Used  . Alcohol use No    ROS: As per history of present illness, otherwise negative  BP 140/78   Pulse 76 Comment: irregular  Ht 5\' 4"  (1.626 m)   Wt 140 lb 3.2 oz (63.6 kg)   BMI 24.07 kg/m  Constitutional: Well-developed and well-nourished. No distress. HEENT: Normocephalic and atraumatic. Oropharynx is clear and moist. Conjunctivae are normal.  No scleral icterus. Neck: Neck supple. Trachea midline. Cardiovascular:  Normal rate, regular rhythm and intact distal pulses.  Pulmonary/chest: Effort normal and breath sounds normal. No wheezing, rales or rhonchi. Abdominal: Soft, nontender, nondistended. Bowel sounds active throughout. There are no masses palpable. Extremities: no clubbing, cyanosis, or edema Neurological: Alert and oriented to person place and time. Skin: Skin is warm and dry.  Psychiatric: Normal mood and affect. Behavior is normal.  RELEVANT LABS AND IMAGING: CBC    Component Value Date/Time   WBC 12.5 (H) 07/01/2016 1122   WBC 8.4 11/26/2013 1237   RBC 4.69 07/01/2016 1122   RBC 4.6 11/26/2013 1237   HGB 14.5 07/01/2016 1122   HCT 43.7 07/01/2016 1122   PLT 214 07/01/2016 1122   MCV 93 07/01/2016 1122   MCH 30.9 07/01/2016 1122   MCH 30.6 11/26/2013 1237   MCHC 33.2 07/01/2016 1122   MCHC 33.4 11/26/2013 1237   RDW 13.3 07/01/2016 1122   LYMPHSABS 1.2 07/01/2016 1122   EOSABS 0.1 07/01/2016 1122   BASOSABS 0.0 07/01/2016 1122    CMP     Component Value Date/Time   NA 140 07/01/2016 1122   K 3.6 07/01/2016 1122   CL 96 07/01/2016 1122   CO2 27 07/01/2016 1122   GLUCOSE 99 07/01/2016 1122   GLUCOSE 75 01/24/2014 1135   BUN 20 07/01/2016 1122   CREATININE 0.85 07/01/2016 1122   CREATININE 0.74 01/24/2014 1135   CALCIUM 9.4 07/11/2016 1227   PROT 5.9 (L) 07/01/2016 1122   ALBUMIN 4.1 07/01/2016 1122   AST 13 07/01/2016 1122   ALT 15 07/01/2016 1122   ALKPHOS 84 07/01/2016 1122   BILITOT 0.7 07/01/2016 1122   GFRNONAA 67 07/01/2016 1122   GFRAA 77 07/01/2016 1122    ASSESSMENT/PLAN: 76 year old female with a complex medical history including atrial fibrillation on warfarin, COPD, Sjogren's syndrome, remote ovarian cancer status post hysterectomy and oophorectomy, hyperparathyroidism, fibromyalgia, restless leg, arthritis and from a GI perspective GERD, IBS and colon polyps who is seen in consultation at the request of Angela Dun, FNP to discuss reflux  and  hoarseness.  1. GERD/hoarseness -- we spent time today reviewing GERD diet and hygiene. I have recommended that she had back the second dose of Nexium. This will be Nexium 40 mg twice a day before meals. She can continue ranitidine 150 mg daily at bedtime. I recommended smaller more frequent meals and avoiding reflux inducing foods such as caffeine, chocolate, acidic foods such as tomatoes. Also she can avoid lying down within 2 hours of eating and drinking regurgitation would likely improve. No true dysphagia symptom but possible intermittent aspiration with thin liquids when drinking quickly. Speech and swallow consultation can be pursued if this becomes more of a problem. Her Sjogren's syndrome also exacerbates her dry mouth and hoarseness based on the nature of this disease.  2. IBS -- stable without alarm symptom.  3. History of colon polyps -- surveillance colonoscopy is recommended next September, 2019. She will need to day bowel preparation. Patient request this be performed in the outpatient hospital setting due to her multiple medical conditions. We will also need permission to hold warfarin 5 days before this procedure. We can discuss this more follow-up.  6 month follow-up, sooner if necessary/needed    NH:[facility]BXU, [address], [place] [facility] [place], [place] [address]

## 2016-09-23 NOTE — Patient Instructions (Addendum)
We have sent the following medications to your pharmacy for you to pick up at your convenience: Nexium 40 mg twice daily before meals Zantac 150 mg every night  You will be due for a recall colonoscopy in 09/2017. We will send you a reminder in the mail when it gets closer to that time.  Please follow up with Dr Hilarie Fredrickson in 6 months.  If you are age 77 or older, your body mass index should be between 23-30. Your Body mass index is 24.07 kg/m. If this is out of the aforementioned range listed, please consider follow up with your Primary Care Provider.  If you are age 42 or younger, your body mass index should be between 19-25. Your Body mass index is 24.07 kg/m. If this is out of the aformentioned range listed, please consider follow up with your Primary Care Provider.   We have requested records from your GI Dr in [place].

## 2016-09-25 ENCOUNTER — Other Ambulatory Visit: Payer: Self-pay | Admitting: Family

## 2016-09-27 ENCOUNTER — Ambulatory Visit (INDEPENDENT_AMBULATORY_CARE_PROVIDER_SITE_OTHER): Payer: Medicare Other | Admitting: Pharmacist Clinician (PhC)/ Clinical Pharmacy Specialist

## 2016-09-27 DIAGNOSIS — I4891 Unspecified atrial fibrillation: Secondary | ICD-10-CM

## 2016-09-27 DIAGNOSIS — I4821 Permanent atrial fibrillation: Secondary | ICD-10-CM

## 2016-09-27 DIAGNOSIS — I482 Chronic atrial fibrillation: Secondary | ICD-10-CM

## 2016-09-27 LAB — COAGUCHEK XS/INR WAIVED
INR: 1.9 — AB (ref 0.9–1.1)
Prothrombin Time: 22.9 s

## 2016-09-27 NOTE — Patient Instructions (Signed)
Anticoagulation Warfarin Dose Instructions as of 09/27/2016      Dorene Grebe Tue Wed Thu Fri Sat   New Dose 2 mg 4 mg 2 mg 4 mg 2 mg 4 mg 2 mg    Description   Change warfarin dose to 4mg  on Mondays, Wednesdays and Fridays. Take 2mg  all other days.  INR was 1.9 today (goal is 2.0 to 3.0)

## 2016-09-29 ENCOUNTER — Other Ambulatory Visit: Payer: Self-pay | Admitting: Family

## 2016-09-29 DIAGNOSIS — G4700 Insomnia, unspecified: Secondary | ICD-10-CM

## 2016-10-01 ENCOUNTER — Ambulatory Visit (INDEPENDENT_AMBULATORY_CARE_PROVIDER_SITE_OTHER): Payer: Medicare Other | Admitting: Allergy and Immunology

## 2016-10-01 ENCOUNTER — Encounter: Payer: Self-pay | Admitting: Allergy and Immunology

## 2016-10-01 VITALS — BP 122/80 | HR 63 | Resp 18 | Ht 64.0 in | Wt 138.4 lb

## 2016-10-01 DIAGNOSIS — L299 Pruritus, unspecified: Secondary | ICD-10-CM | POA: Insufficient documentation

## 2016-10-01 DIAGNOSIS — J3089 Other allergic rhinitis: Secondary | ICD-10-CM | POA: Insufficient documentation

## 2016-10-01 DIAGNOSIS — W57XXXA Bitten or stung by nonvenomous insect and other nonvenomous arthropods, initial encounter: Secondary | ICD-10-CM

## 2016-10-01 DIAGNOSIS — J4540 Moderate persistent asthma, uncomplicated: Secondary | ICD-10-CM

## 2016-10-01 DIAGNOSIS — L298 Other pruritus: Secondary | ICD-10-CM | POA: Diagnosis not present

## 2016-10-01 MED ORDER — AZELASTINE HCL 0.1 % NA SOLN: 2.0000 | Freq: Two times a day (BID) | NASAL | 5 refills | Status: DC

## 2016-10-01 MED ORDER — ALBUTEROL SULFATE HFA 108 (90 BASE) MCG/ACT IN AERS: 2.0000 | INHALATION_SPRAY | Freq: Four times a day (QID) | RESPIRATORY_TRACT | 2 refills | Status: DC | PRN

## 2016-10-01 NOTE — Patient Instructions (Addendum)
Allergic rhinitis with a nonallergic component Mixed rhinitis.  Aeroallergen avoidance measures have been discussed and provided in written form.  A prescription has been provided for azelastine nasal spray, 1-2 sprays per nostril 2 times daily as needed. Proper nasal spray technique has been discussed and demonstrated.   I have also recommended nasal saline spray (i.e., Simply Saline) or nasal saline lavage (i.e., NeilMed) as needed and prior to medicated nasal sprays.  For thick post nasal drainage, add guaifenesin 600 mg (Mucinex)  twice daily as needed with adequate hydration as discussed.  For now, continue montelukast 10 mg daily at bedtime.  Moderate persistent asthma Currently well controlled.  Continue Symbicort 160/4.5 g, 2 inhalations twice a day, montelukast 10 mg daily at bedtime, and albuterol HFA, 1-2 inhalations every 4-6 hours as needed.  To maximize pulmonary deposition, a spacer has been provided along with instructions for its proper administration with an HFA inhaler.  Subjective and objective measures of pulmonary function will be followed and the treatment plan will be adjusted accordingly.  Pruritus  I have recommended moisturizing the skin with Aquaphor.   Continue fexofenadine (Allegra) as needed.  To avoid diminishing benefit with daily use (tachyphylaxis) of second generation antihistamine, consider alternating every few months between fexofenadine (Allegra) and loratadine (Claritin).  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   Information regarding Skeeter Syndrome has been discussed.  Recommedations have been provided regarding mosquito avoidance and early treatment with ice, antihistamines, topical corticosteroids and antiinflammatories.   Return in about 3 months (around 12/31/2016), or if symptoms worsen or fail to improve.    Control of Mold Allergen  Mold and fungi can grow on a variety of surfaces provided certain  temperature and moisture conditions exist.  Outdoor molds grow on plants, decaying vegetation and soil.  The major outdoor mold, Alternaria and Cladosporium, are found in very high numbers during hot and dry conditions.  Generally, a late Summer - Fall peak is seen for common outdoor fungal spores.  Rain will temporarily lower outdoor mold spore count, but counts rise rapidly when the rainy period ends.  The most important indoor molds are Aspergillus and Penicillium.  Dark, humid and poorly ventilated basements are ideal sites for mold growth.  The next most common sites of mold growth are the bathroom and the kitchen.  Outdoor [employer] 1. Use air conditioning and keep windows closed 2. Avoid exposure to decaying vegetation. 3. Avoid leaf raking. 4. Avoid grain handling. 5. Consider wearing a face mask if working in moldy areas.  Indoor Mold Control 1. Maintain humidity below 50%. 2. Clean washable surfaces with 5% bleach solution. 3. Remove sources e.g. Contaminated carpets. 4. May use HEPA filter in the bedroom.   Control of Dog or Cat Allergen  Avoidance is the best way to manage a dog or cat allergy. If you have a dog or cat and are allergic to dog or cats, consider removing the dog or cat from the home. If you have a dog or cat but don't want to find it a new home, or if your family wants a pet even though someone in the household is allergic, here are some strategies that may help keep symptoms at bay:  1. Keep the pet out of your bedroom and restrict it to only a few rooms. Be advised that keeping the dog or cat in only one room will not limit the allergens to that room. 2. Don't pet, hug or kiss the dog or cat; if  you do, wash your hands with soap and water. 3. High-efficiency particulate air (HEPA) cleaners run continuously in a bedroom or living room can reduce allergen levels over time. 4. Place electrostatic material sheet in the air inlet vent in the bedroom. 5. Regular use  of a high-efficiency vacuum cleaner or a central vacuum can reduce allergen levels. 6. Giving your dog or cat a bath at least once a week can reduce airborne allergen.   Skeeter Syndrome Treatment   Mosquito avoidance (see information below)  Ice affected area  Oral antihistamine (Benadryl or Zyrtec)  Oral anti-inflammatory (ibuprofen)  Topical corticosteroid (Hydrocortisone cream 1%)    Strategies for Safer Mosquito Avoidance  by [place] are a terrible nuisance in the muggy summer months, especially now that the ferocious Asian tiger mosquito has made a permanent home here in [place]. The arrival of [place] virus has added some urgency to mosquito control measures, but spray programs and many repellents may do more harm than good in the long term. Choosing the least-toxic solutions can protect both your health and comfort in mosquito season. Here are some suggestions for safer and more effective bite avoidance this summer.   Population Control  Keeping mosquito populations in check is the most important way to avoid bites. It's no secret that removing sources of standing water is crucial to eliminating mosquito breeding grounds. Common breeding sites to watch for include:  * Rain gutters. Clean them out and offer to do the same for elderly neighbors or others who may not be able to do the job themselves. Remember that mosquito control is a community-wide effort.  * Flowerpots, buckets and old tires. Be sure empty containers cannot hold water.  * Bird baths and pet dishes. Empty and clean them weekly.  * Recycling bins and the cans inside. These may harbor stagnant water if not emptied regularly.  * Rain barrels. Be sure they are sealed off from mosquitoes.  * Storm drains. Watch for clogs from branches and garbage.  Insecticide sprays targeting adult mosquitoes can only reduce mosquito populations for a day or two. In fact, since insecticides also kill off  important mosquito predators such as dragonflies, a spray program can actually be counter-productive by leaving the rebounding mosquito population without natural enemies.  Instead, interrupt the breeding cycle by using the nontoxic bacterial larvicide Bacillus thuringiensis var. israelensis (Bti). Bti is sold in convenient donuts called "mosquito dunks" that you can safely use in your bird bath, rain barrel or low areas around your yard to kill mosquito larvae before the adults emerge and spread throughout the community, where they become much harder to kill. Bti is not harmful to fish, birds or mammals, and single applications can remain effective for a month or more, even if the water source dries out and refills.   Safer Repellents  If you'll be outdoors at dawn or dusk when mosquitoes are most active, wear long clothes that don't leave skin exposed. (You may use insect repellent on your clothes). When you do get bites, soothe them by slathering on an astringent such as witch hazel after you come inside - it will prevent scratching and allow bites to heal quickly.  Lately many public health officials concerned about [place] virus have been advising people to use repellents containing the pesticide DEET (N,N-diethyl-meta-toluamide). While DEET is an extremely effective mosquito repellent, it is also a neurotoxin, and studies have shown that prolonged frequent exposure can irritate skin, cause  muscle twitching and weakness and harm the brain and nervous system, especially when combined with other pesticides such as permethrin.  Consumer studies report that [employer]'s Skin-So-Soft and herbal repellents containing citronella can be just as effective as DEET at repelling mosquitoes but need to be applied more often. The solution is to choose the safer formulas and reapply as needed.  General guidelines for using any insect repellent:  * Choose oils or lotions rather than sprays, which produce fine particles  that are easily inhaled.  * Do not apply repellents to broken skin.  * Do not allow children to apply their own repellent, and do not apply repellents containing DEET or other pesticides directly to children's skin. If you use such products, they can be applied to children's clothing instead.  * Do not use sunscreen/repellent combinations. Sunscreen needs to be reapplied more often than repellents, so the combination products can result in overexposure to pesticides.  * Wash off all repellent from skin and clothing immediately after coming indoors.  Area-wide repellent strategies can also be effective for outdoor gatherings. There are various contraptions available that emit carbon dioxide to trap mosquitoes (such as the Mosquito Magnet and Mosquito Deleto). These are expensive, but they do work, and some companies will even rent them to you for an outdoor event. Citronella candles are also effective when there is no breeze, but beware of candles containing pesticides - the smoke is easily inhaled and can irritate the airway. Placing fans around your porch or patio can blow mosquitoes away.  Keep in mind that only female mosquitoes actually bite and that most mosquito species in this area do not transmit West Nile virus. You are most at risk of being bitten by a mosquito carrying the disease at dawn and dusk, and even in these cases your chances of actually contracting the virus are extremely low. So take sensible steps to keep the buggers under control, but also keep them in perspective as the annoyances they are.

## 2016-10-01 NOTE — Assessment & Plan Note (Addendum)
Mixed rhinitis.  Aeroallergen avoidance measures have been discussed and provided in written form.  A prescription has been provided for azelastine nasal spray, 1-2 sprays per nostril 2 times daily as needed. Proper nasal spray technique has been discussed and demonstrated.   I have also recommended nasal saline spray (i.e., Simply Saline) or nasal saline lavage (i.e., NeilMed) as needed and prior to medicated nasal sprays.  For thick post nasal drainage, add guaifenesin 600 mg (Mucinex)  twice daily as needed with adequate hydration as discussed.  For now, continue montelukast 10 mg daily at bedtime.

## 2016-10-01 NOTE — Assessment & Plan Note (Signed)
   I have recommended moisturizing the skin with Aquaphor.   Continue fexofenadine (Allegra) as needed.  To avoid diminishing benefit with daily use (tachyphylaxis) of second generation antihistamine, consider alternating every few months between fexofenadine (Allegra) and loratadine (Claritin).

## 2016-10-01 NOTE — Assessment & Plan Note (Signed)
Klee's history suggests Skeeter Syndrome.   Information regarding Skeeter Syndrome has been discussed.  Recommedations have been provided regarding mosquito avoidance and early treatment with ice, antihistamines, topical corticosteroids and antiinflammatories.

## 2016-10-01 NOTE — Progress Notes (Signed)
New Patient Note  RE: RHYTHM GUBBELS MRN: [id number] DOB: Jan 05, 1941 Date of Office Visit: 10/01/2016  Referring provider: [name], FNP Primary care provider: [name], FNP  Chief Complaint: Nasal Congestion; Sinus Problem; Asthma; and Pruritus   History of present illness: Angela Wong is a 76 y.o. female seen today in consultation requested by [name], FNP.  She complains of nasal congestion, thick postnasal drainage, hoarseness, dry/itchy eyes, and sinus pressure over the forehead, over the cheek bones, and between the eyes.  These symptoms occur year around and specific triggers include exposure to mold, pollen, dog, cat, and dust.  She experiences rhinorrhea when she consumes food or beverages which are either very cold or very hot.   She complains of generalized pruritus which is worse after taking a hot bath at bedtime.  She has Sjogren syndrome and suspects that the pruritus is related to dry skin and possibly increased stress. She was diagnosed with asthma approximately 20 years ago.  Specific asthma triggers include physical exertion, cold air, hot/humid air, and upper respiratory tract infections.  She currently takes Symbicort 160/4.5 g, 2 inhalations twice a day, and montelukast 10 mg daily at bedtime.  On this regimen she typically experiences asthma symptoms one or 2 times per month and nocturnal awakenings due to lower respiratory symptoms one time per month on average. Lafern also complains of large local reactions when bitten by mosquitoes.   Assessment and plan: Allergic rhinitis with a nonallergic component Mixed rhinitis.  Aeroallergen avoidance measures have been discussed and provided in written form.  A prescription has been provided for azelastine nasal spray, 1-2 sprays per nostril 2 times daily as needed. Proper nasal spray technique has been discussed and demonstrated.   I have also recommended nasal saline spray (i.e., Simply  Saline) or nasal saline lavage (i.e., NeilMed) as needed and prior to medicated nasal sprays.  For thick post nasal drainage, add guaifenesin 600 mg (Mucinex)  twice daily as needed with adequate hydration as discussed.  For now, continue montelukast 10 mg daily at bedtime.  Moderate persistent asthma Currently well controlled.  Continue Symbicort 160/4.5 g, 2 inhalations twice a day, montelukast 10 mg daily at bedtime, and albuterol HFA, 1-2 inhalations every 4-6 hours as needed.  To maximize pulmonary deposition, a spacer has been provided along with instructions for its proper administration with an HFA inhaler.  Subjective and objective measures of pulmonary function will be followed and the treatment plan will be adjusted accordingly.  Pruritus  I have recommended moisturizing the skin with Aquaphor.   Continue fexofenadine (Allegra) as needed.  To avoid diminishing benefit with daily use (tachyphylaxis) of second generation antihistamine, consider alternating every few months between fexofenadine (Allegra) and loratadine (Claritin).  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   Information regarding Skeeter Syndrome has been discussed.  Recommedations have been provided regarding mosquito avoidance and early treatment with ice, antihistamines, topical corticosteroids and antiinflammatories.   Meds ordered this encounter  Medications  . albuterol (PROVENTIL HFA;VENTOLIN HFA) 108 (90 Base) MCG/ACT inhaler    Sig: Inhale 2 puffs into the lungs every 6 (six) hours as needed for wheezing.    Dispense:  1 Inhaler    Refill:  2  . azelastine (ASTELIN) 0.1 % nasal spray    Sig: Place 2 sprays into both nostrils 2 (two) times daily.    Dispense:  30 mL    Refill:  5    Diagnostics: Spirometry: FVC was  2.40 L and FEV1 was 1.61 L (83% predicted) with 130 mL (8%) postbronchodilator improvement.  Mild airways obstruction with partial reversibility while  asymptomatic.   Please see scanned spirometry results for details. Environmental skin testing: Positive to molds, ragweed pollen, and cat hair.    Physical examination: Blood pressure 122/80, pulse 63, resp. rate 18, height 5\' 4"  (1.626 m), weight 138 lb 6.4 oz (62.8 kg), SpO2 98 %.  General: Alert, interactive, in no acute distress. HEENT: TMs pearly gray, turbinates moderately edematous without discharge, post-pharynx moderately erythematous. Neck: Supple without lymphadenopathy. Lungs: Clear to auscultation without wheezing, rhonchi or rales. CV: Normal S1, S2 without murmurs. Abdomen: Nondistended, nontender. Skin: Warm and dry, without lesions or rashes. Extremities:  No clubbing, cyanosis or edema. Neuro:   Grossly intact.  Review of systems:  Review of systems negative except as noted in HPI / PMHx or noted below: Review of Systems  Constitutional: Negative.   HENT: Negative.   Eyes: Negative.   Respiratory: Negative.   Cardiovascular: Negative.   Gastrointestinal: Negative.   Genitourinary: Negative.   Musculoskeletal: Negative.   Skin: Negative.   Neurological: Negative.   Endo/Heme/Allergies: Negative.   Psychiatric/Behavioral: Negative.     Past medical history:  Past Medical History:  Diagnosis Date  . A-fib ([place])   . Asthma   . Cerebral vasculitis   . COPD (chronic obstructive pulmonary disease) ([place])   . Fibromyalgia   . GERD (gastroesophageal reflux disease)   . Hyperparathyroidism ([place])   . IBS (irritable bowel syndrome)   . MVP (mitral valve prolapse)   . Osteoarthritis   . Ovarian cancer ([place])    lymph node removal with hysterectomy  . Raynaud's disease   . RLS (restless legs syndrome)   . Situational depression   . Sjogren's syndrome ([place])   . Vasculitis ([place])   . Vitamin D deficiency     Past surgical history:  Past Surgical History:  Procedure Laterality Date  . ABDOMINAL HYSTERECTOMY  1982   with right oophorectomy  . APPENDECTOMY      . BREAST BIOPSY Right    x 2  . BREAST SURGERY     Biopsy  . CARPAL TUNNEL RELEASE Right 1980  . CARPAL TUNNEL RELEASE Left 2010   x 2  . CATARACT EXTRACTION Bilateral   . CESAREAN SECTION     x 3  . KNEE SURGERY Left 2005  . OOPHORECTOMY Left 1962  . PACEMAKER INSERTION  09/15/2014  . REFRACTIVE SURGERY Bilateral 2014  . TUBAL LIGATION      Family history: Family History  Problem Relation Age of Onset  . COPD Mother   . Breast cancer Mother   . Allergic rhinitis Mother   . Heart disease Father        No details  . Kidney disease Father   . Allergic rhinitis Father   . Stroke Sister   . Arthritis/Rheumatoid Sister   . Asthma Sister   . Stroke Paternal Grandmother   . Scleroderma Grandchild   . Thyroid disease Other   . Breast cancer Maternal Aunt        x 2    Social history: Social History   Social History  . Marital status: Single    Spouse name: N/A  . Number of children: N/A  . Years of education: N/A   Occupational History  . Not on file.   Social History Main Topics  . Smoking status: Never Smoker  . Smokeless tobacco: Never Used  .  Alcohol use No  . Drug use: No  . Sexual activity: Not on file   Other Topics Concern  . Not on file   Social History Narrative   Lives alone.  Moved from [place].     Environmental History: The patient lives in a 29-49 year old house with laminate floors throughout and central air/heat.  There is mold/water damage in the basement.  There are 2 dogs in the home which have access to her bedroom.  She is a nonsmoker.  Allergies as of 10/01/2016      Reactions   Cephalosporins Hives, Shortness Of Breath   Ciprofloxacin Hives, Shortness Of Breath   Doxycycline Hives, Shortness Of Breath   Horse-derived Products Anaphylaxis   Ketek [telithromycin] Palpitations   Chest discomfort   Nitrofuran Derivatives Anaphylaxis, Hives   Nitrous Oxide Nausea And Vomiting   Severe due to Sjogrens (Auto-Immune Disease)   Other  Anaphylaxis   ALLERGY TO HORSE SERUM   Penicillins Hives, Shortness Of Breath   Sulfa Antibiotics Hives, Shortness Of Breath   Talwin [pentazocine] Other (See Comments)   Other reaction(s): Mental Status Changes (intolerance) Altered Mental Status  Altered Mental Status    Trovan [alatrofloxacin] Palpitations, Other (See Comments), Anaphylaxis   Chest pain, dizziness, irregular pulse   Amlodipine Swelling   Calcium Channel Blockers    Respiratory distress   Carvedilol Other (See Comments)   dizziness   Codeine Nausea Only   Cymbalta [duloxetine Hcl] Swelling   Diltiazem    Swollen throat   Diovan [valsartan] Swelling   Lisinopril Swelling   Tramadol Nausea Only   Zoloft [sertraline Hcl]    Loteprednol Etabonate Rash      Medication List       Accurate as of 10/01/16 12:57 PM. Always use your most recent med list.          albuterol 108 (90 Base) MCG/ACT inhaler Commonly known as:  PROVENTIL HFA;VENTOLIN HFA Inhale 2 puffs into the lungs every 6 (six) hours as needed for wheezing.   ALPRAZolam 0.25 MG tablet Commonly known as:  XANAX Take 0.25 mg by mouth as needed.   atenolol 25 MG tablet Commonly known as:  TENORMIN Take 12.5 mg by mouth daily.   azelastine 0.1 % nasal spray Commonly known as:  ASTELIN Place 2 sprays into both nostrils 2 (two) times daily.   B-complex with vitamin C tablet Take 1 tablet by mouth daily.   bumetanide 0.5 MG tablet Commonly known as:  BUMEX Take 1 tablet (0.5 mg total) by mouth every morning.   bumetanide 1 MG tablet Commonly known as:  BUMEX Take 1 tablet (1 mg total) by mouth every other day. On opposite day of 0.5mg  of bumetadnide   cycloSPORINE 0.05 % ophthalmic emulsion Commonly known as:  RESTASIS Place 1 drop into both eyes 2 (two) times daily.   docusate sodium 100 MG capsule Commonly known as:  COLACE Take 400 mg by mouth daily.   esomeprazole 40 MG capsule Commonly known as:  NEXIUM Take 1 capsule (40 mg  total) by mouth 2 (two) times daily before a meal.   fexofenadine 180 MG tablet Commonly known as:  ALLEGRA Take 180 mg by mouth every morning.   GAS-X EXTRA STRENGTH 125 MG Caps Generic drug:  Simethicone Take 1 capsule by mouth daily as needed (for relief).   levothyroxine 100 MCG tablet Commonly known as:  SYNTHROID, LEVOTHROID TAKE 1 TABLET BY MOUTH EVERY DAY   losartan 100 MG tablet Commonly known  as:  COZAAR TAKE 1 TABLET (100 MG TOTAL) BY MOUTH DAILY.   Magnesium 200 MG Tabs Take 1 tablet by mouth daily.   montelukast 10 MG tablet Commonly known as:  SINGULAIR TAKE 1 TABLET BY MOUTH DAILY AT BEDTIME   Olopatadine HCl 0.2 % Soln Commonly known as:  PATADAY Apply 1 drop to eye daily.   oxycodone 5 MG capsule Commonly known as:  OXY-IR Take 1 capsule (5 mg total) by mouth every 4 (four) hours as needed.   POLY-IRON 150 FORTE 150-0.025-1 MG Caps Generic drug:  Iron Polysacch Cmplx-B12-FA TAKE 1 CAPSULE BY MOUTH EVERY DAY   predniSONE 5 MG tablet Commonly known as:  DELTASONE TAKE 1 TABLET BY MOUTH EVERY DAY   PREVIDENT 5000 DRY MOUTH 1.1 % Gel dental gel Generic drug:  sodium fluoride Place 1 application onto teeth as needed (as needed for dry mouth).   ranitidine 150 MG tablet Commonly known as:  ZANTAC One at bedtime   SYMBICORT 160-4.5 MCG/ACT inhaler Generic drug:  budesonide-formoterol Inhale 2 puffs into the lungs 2 (two) times daily.   traZODone 150 MG tablet Commonly known as:  DESYREL TAKE 1 TABLET (150 MG TOTAL) BY MOUTH AT BEDTIME.   VITAMIN B 12 PO Take 500 mg by mouth daily.   Vitamin D 2000 units Caps Take 2 capsules by mouth every evening.   warfarin 1 MG tablet Commonly known as:  COUMADIN TAKE 2 TO 4 TABLETS (= 2 TO 4MG ) BY MOUTH ONCE DAILY AS DIRECTED BY ANTICOGULATION CLINIC.            Discharge Care Instructions        Start     Ordered   10/01/16 0000  Spirometry with Graph    Question Answer Comment  Where should  this test be performed? Other   Basic spirometry Yes   Spirometry pre & post bronchodilator Yes      10/01/16 1151   10/01/16 0000  Allergy Test    Question:  Allergy test to perform  Answer:  1-69   10/01/16 1151   10/01/16 0000  albuterol (PROVENTIL HFA;VENTOLIN HFA) 108 (90 Base) MCG/ACT inhaler  Every 6 hours PRN     10/01/16 1151   10/01/16 0000  azelastine (ASTELIN) 0.1 % nasal spray  2 times daily     10/01/16 1151   10/01/16 0000  Interdermal Allergy Test    Question Answer Comment  Allergens Control   Allergens [place]   Allergens Johnson   Allergens 7 Grass   Allergens Weed Mix   Allergens Tree Mix   Allergens Mold 1   Allergens Mold 2   Allergens Mold 3   Allergens Cat   Allergens Dog   Allergens Cockroach   Allergens Mite Mix   Allergens Ragweed Mix      10/01/16 1152      Known medication allergies: Allergies  Allergen Reactions  . Cephalosporins Hives and Shortness Of Breath  . Ciprofloxacin Hives and Shortness Of Breath  . Doxycycline Hives and Shortness Of Breath  . Horse-Derived Products Anaphylaxis  . Ketek [Telithromycin] Palpitations    Chest discomfort  . Nitrofuran Derivatives Anaphylaxis and Hives  . Nitrous Oxide Nausea And Vomiting    Severe due to Sjogrens (Auto-Immune Disease)  . Other Anaphylaxis    ALLERGY TO HORSE SERUM  . Penicillins Hives and Shortness Of Breath  . Sulfa Antibiotics Hives and Shortness Of Breath  . Talwin [Pentazocine] Other (See Comments)    Other  reaction(s): Mental Status Changes (intolerance) Altered Mental Status  Altered Mental Status   . Trovan [Alatrofloxacin] Palpitations, Other (See Comments) and Anaphylaxis    Chest pain, dizziness, irregular pulse  . Amlodipine Swelling  . Calcium Channel Blockers     Respiratory distress  . Carvedilol Other (See Comments)    dizziness  . Codeine Nausea Only  . Cymbalta [Duloxetine Hcl] Swelling  . Diltiazem     Swollen throat   . Diovan [Valsartan] Swelling    . Lisinopril Swelling  . Tramadol Nausea Only  . Zoloft [Sertraline Hcl]   . Loteprednol Etabonate Rash    I appreciate the opportunity to take part in Jonia's care. Please do not hesitate to contact me with questions.  Sincerely,   R. Edgar Frisk, MD

## 2016-10-01 NOTE — Assessment & Plan Note (Signed)
Currently well controlled.  Continue Symbicort 160/4.5 g, 2 inhalations twice a day, montelukast 10 mg daily at bedtime, and albuterol HFA, 1-2 inhalations every 4-6 hours as needed.  To maximize pulmonary deposition, a spacer has been provided along with instructions for its proper administration with an HFA inhaler.  Subjective and objective measures of pulmonary function will be followed and the treatment plan will be adjusted accordingly.

## 2016-10-03 ENCOUNTER — Telehealth: Payer: Self-pay | Admitting: Family

## 2016-10-03 MED ORDER — METRONIDAZOLE 1 % EX GEL: Freq: Every day | CUTANEOUS | 0 refills | Status: DC

## 2016-10-03 NOTE — Telephone Encounter (Signed)
Left message rx sent to the pharmacy

## 2016-10-03 NOTE — Telephone Encounter (Signed)
Prescription sent to pharmacy.

## 2016-10-07 ENCOUNTER — Telehealth: Payer: Self-pay | Admitting: Family

## 2016-10-07 ENCOUNTER — Other Ambulatory Visit: Payer: Self-pay | Admitting: *Deleted

## 2016-10-07 ENCOUNTER — Other Ambulatory Visit: Payer: Self-pay | Admitting: Family

## 2016-10-07 ENCOUNTER — Other Ambulatory Visit: Payer: Self-pay | Admitting: Family Medicine

## 2016-10-07 DIAGNOSIS — H1013 Acute atopic conjunctivitis, bilateral: Secondary | ICD-10-CM

## 2016-10-07 MED ORDER — METRONIDAZOLE 1 % EX GEL: Freq: Every day | CUTANEOUS | 0 refills | Status: DC

## 2016-10-07 NOTE — Telephone Encounter (Signed)
Patient aware that medication [facility]

## 2016-10-07 NOTE — Progress Notes (Signed)
Pt called to request RX for [facility]rogel [facility] into [facility] instead of [facility]

## 2016-10-07 NOTE — Telephone Encounter (Signed)
[facility] request Okayed per [facility]

## 2016-10-08 ENCOUNTER — Telehealth: Payer: Self-pay | Admitting: Family

## 2016-10-08 NOTE — Telephone Encounter (Signed)
Patient informed that because of her age and health history, we do recommend high dose flu for her.  She can get this at anytime.  Patient would like to go ahead and have it, appointment made 10/09/2016.

## 2016-10-09 ENCOUNTER — Ambulatory Visit (INDEPENDENT_AMBULATORY_CARE_PROVIDER_SITE_OTHER): Payer: Medicare Other

## 2016-10-09 DIAGNOSIS — Z23 Encounter for immunization: Secondary | ICD-10-CM | POA: Diagnosis not present

## 2016-10-15 ENCOUNTER — Telehealth: Payer: Self-pay | Admitting: Allergy & Immunology

## 2016-10-15 NOTE — Telephone Encounter (Signed)
Patient wants to make sure that her pharmacy [facility] [place] - patient states that her meds were sent to the wrong pharmacy last time.  Also patient received a letter form [employer] about her insurance information - she has a few questions before she fills out the form and sends it back

## 2016-10-17 ENCOUNTER — Other Ambulatory Visit: Payer: Self-pay | Admitting: Family

## 2016-10-28 DIAGNOSIS — Z45010 Encounter for checking and testing of cardiac pacemaker pulse generator [battery]: Secondary | ICD-10-CM | POA: Diagnosis not present

## 2016-10-28 DIAGNOSIS — Z45018 Encounter for adjustment and management of other part of cardiac pacemaker: Secondary | ICD-10-CM | POA: Diagnosis not present

## 2016-10-28 DIAGNOSIS — Z950 Presence of cardiac pacemaker: Secondary | ICD-10-CM | POA: Diagnosis not present

## 2016-10-29 ENCOUNTER — Other Ambulatory Visit: Payer: Self-pay | Admitting: Family

## 2016-11-01 ENCOUNTER — Telehealth: Payer: Self-pay | Admitting: Family

## 2016-11-01 ENCOUNTER — Other Ambulatory Visit: Payer: Self-pay | Admitting: Family

## 2016-11-01 MED ORDER — BUDESONIDE-FORMOTEROL FUMARATE 160-4.5 MCG/ACT IN AERO: 2.0000 | INHALATION_SPRAY | Freq: Two times a day (BID) | RESPIRATORY_TRACT | 2 refills | Status: DC

## 2016-11-01 NOTE — Telephone Encounter (Signed)
Prescription sent to pharmacy.

## 2016-11-01 NOTE — Telephone Encounter (Signed)
What is the name of the medication? Symbicort 164.5 90 supply  Have you contacted your pharmacy to request a refill? YES  Which [facility] [place]   Patient notified that their request is being sent to the clinical staff for review and that they should receive a call once it is complete. If they do not receive a call within 24 hours they can check with their pharmacy or our office.

## 2016-11-07 ENCOUNTER — Ambulatory Visit (INDEPENDENT_AMBULATORY_CARE_PROVIDER_SITE_OTHER): Payer: Medicare Other | Admitting: *Deleted

## 2016-11-07 DIAGNOSIS — I482 Chronic atrial fibrillation: Secondary | ICD-10-CM | POA: Diagnosis not present

## 2016-11-07 DIAGNOSIS — I4821 Permanent atrial fibrillation: Secondary | ICD-10-CM

## 2016-11-07 DIAGNOSIS — M7062 Trochanteric bursitis, left hip: Secondary | ICD-10-CM | POA: Diagnosis not present

## 2016-11-07 DIAGNOSIS — M1712 Unilateral primary osteoarthritis, left knee: Secondary | ICD-10-CM | POA: Diagnosis not present

## 2016-11-07 DIAGNOSIS — I4891 Unspecified atrial fibrillation: Secondary | ICD-10-CM

## 2016-11-07 LAB — COAGUCHEK XS/INR WAIVED
INR: 2.2 — ABNORMAL HIGH (ref 0.9–1.1)
Prothrombin Time: 26.5 s

## 2016-11-07 NOTE — Patient Instructions (Signed)
Anticoagulation Warfarin Dose Instructions as of 11/07/2016      Dorene Grebe Tue Wed Thu Fri Sat   New Dose 2 mg 4 mg 2 mg 4 mg 2 mg 4 mg 2 mg    Description   Continue warfarin dose of 4mg  on Mondays, Wednesdays and Fridays. Take 2mg  all other days.  INR was 2.2 today (goal is 2.0 to 3.0)  Return on 11/30 at 12:00

## 2016-11-08 ENCOUNTER — Encounter: Payer: Self-pay | Admitting: Pharmacist Clinician (PhC)/ Clinical Pharmacy Specialist

## 2016-11-08 NOTE — Progress Notes (Addendum)
Subjective:     Indication: atrial fibrillation Bleeding signs/symptoms: None Thromboembolic signs/symptoms: None  Missed Coumadin doses: None Medication changes: no Dietary changes: no Bacterial/viral infection: no Other concerns: no  The following portions of the patient's history were reviewed and updated as appropriate: allergies and current medications.  Review of Systems Pertinent items are noted in HPI.   Objective:    INR Today: 2.2 Current dose: 4mg  M, W, F and 2 mg all other days  Assessment:    Therapeutic INR for goal of 2-3   Plan:    1. New dose: no change   2. Next INR: 1 month    [name], RN

## 2016-11-14 ENCOUNTER — Other Ambulatory Visit: Payer: Self-pay | Admitting: *Deleted

## 2016-11-14 DIAGNOSIS — H1013 Acute atopic conjunctivitis, bilateral: Secondary | ICD-10-CM

## 2016-11-14 MED ORDER — OLOPATADINE HCL 0.2 % OP SOLN: 1.0000 [drp] | Freq: Every day | OPHTHALMIC | 2 refills | Status: DC

## 2016-11-14 MED ORDER — CYCLOSPORINE 0.05 % OP EMUL: 1.0000 [drp] | Freq: Two times a day (BID) | OPHTHALMIC | 2 refills | Status: DC

## 2016-12-02 ENCOUNTER — Other Ambulatory Visit: Payer: Self-pay

## 2016-12-02 MED ORDER — BUMETANIDE 1 MG PO TABS: 1.0000 mg | ORAL_TABLET | ORAL | 1 refills | Status: DC

## 2016-12-03 ENCOUNTER — Other Ambulatory Visit: Payer: Self-pay | Admitting: Family

## 2016-12-03 NOTE — Telephone Encounter (Signed)
Last seen 08/12/16  [facility]

## 2016-12-13 ENCOUNTER — Ambulatory Visit (INDEPENDENT_AMBULATORY_CARE_PROVIDER_SITE_OTHER): Payer: Medicare Other | Admitting: Pharmacist Clinician (PhC)/ Clinical Pharmacy Specialist

## 2016-12-13 ENCOUNTER — Other Ambulatory Visit: Payer: Self-pay

## 2016-12-13 DIAGNOSIS — I482 Chronic atrial fibrillation: Secondary | ICD-10-CM

## 2016-12-13 DIAGNOSIS — I4891 Unspecified atrial fibrillation: Secondary | ICD-10-CM | POA: Diagnosis not present

## 2016-12-13 DIAGNOSIS — J45909 Unspecified asthma, uncomplicated: Secondary | ICD-10-CM

## 2016-12-13 DIAGNOSIS — I4821 Permanent atrial fibrillation: Secondary | ICD-10-CM

## 2016-12-13 LAB — COAGUCHEK XS/INR WAIVED
INR: 2 — AB (ref 0.9–1.1)
Prothrombin Time: 24.2 s

## 2016-12-13 MED ORDER — WARFARIN SODIUM 1 MG PO TABS: ORAL_TABLET | ORAL | 3 refills | Status: DC

## 2016-12-13 MED ORDER — MONTELUKAST SODIUM 10 MG PO TABS: 10.0000 mg | ORAL_TABLET | Freq: Every day | ORAL | 0 refills | Status: DC

## 2016-12-13 NOTE — Patient Instructions (Signed)
Description   Continue warfarin dose of 4mg  on Mondays, Wednesdays and Fridays. Take 2mg  all other days.  INR was 2.0 today (goal is 2.0 to 3.0)  Return on 11/30 at 12:00

## 2016-12-16 ENCOUNTER — Other Ambulatory Visit: Payer: Self-pay | Admitting: Family

## 2016-12-16 NOTE — Telephone Encounter (Signed)
Last seen 08/12/16  [facility]

## 2016-12-17 ENCOUNTER — Other Ambulatory Visit: Payer: Self-pay | Admitting: *Deleted

## 2016-12-17 MED ORDER — LOSARTAN POTASSIUM 100 MG PO TABS: 100.0000 mg | ORAL_TABLET | Freq: Every day | ORAL | 0 refills | Status: DC

## 2016-12-18 ENCOUNTER — Other Ambulatory Visit: Payer: Self-pay | Admitting: Family Medicine

## 2016-12-18 ENCOUNTER — Other Ambulatory Visit: Payer: Self-pay | Admitting: *Deleted

## 2016-12-18 MED ORDER — BUMETANIDE 0.5 MG PO TABS: 0.5000 mg | ORAL_TABLET | ORAL | 1 refills | Status: DC

## 2016-12-18 MED ORDER — BUMETANIDE 1 MG PO TABS: 1.0000 mg | ORAL_TABLET | ORAL | 1 refills | Status: DC

## 2016-12-18 NOTE — Telephone Encounter (Signed)
Fax request new Rx Pt takes Bumetanide 1 mg qod, then 0.5 mg on opposite day Requesting new Rx for 0.5 mg tab so that she does not have to cut the 1 mg tab This prescription is on the patients current med list [facility]

## 2016-12-25 ENCOUNTER — Other Ambulatory Visit: Payer: Self-pay

## 2016-12-25 ENCOUNTER — Telehealth: Payer: Self-pay | Admitting: *Deleted

## 2016-12-25 DIAGNOSIS — G4700 Insomnia, unspecified: Secondary | ICD-10-CM

## 2016-12-25 MED ORDER — TRAZODONE HCL 150 MG PO TABS: 150.0000 mg | ORAL_TABLET | Freq: Every day | ORAL | 0 refills | Status: DC

## 2016-12-25 MED ORDER — BUDESONIDE-FORMOTEROL FUMARATE 160-4.5 MCG/ACT IN AERO: 2.0000 | INHALATION_SPRAY | Freq: Two times a day (BID) | RESPIRATORY_TRACT | 2 refills | Status: DC

## 2016-12-25 NOTE — Telephone Encounter (Signed)
Got [facility] from [facility] [address] stating the esomeprazole was costing $65.00 monthly for patient. Patient is requesting to take omeprazole instead which is $33.00 monthly. It appears she has had some issues with uncontrolled reflux recently as per her last office visit. Please advise as to which medication you think would be appropriate.

## 2016-12-26 NOTE — Telephone Encounter (Signed)
Ok to change to omeprazole 40 mg BID-AC Can use the ranitidine 150 mg qHS PRN for breakthrough reflux symptoms

## 2016-12-26 NOTE — Telephone Encounter (Signed)
I have contacted patient who tells me that her pharmacy is actually the one who decided she should try the omeprazole. She is not the one who requested to change from the omeprazole. She would prefer to stay on esomeprazole as she says omeprazole has not worked in the past. I advised that we will leave her on esomeprazole if this is her preference.

## 2016-12-31 ENCOUNTER — Encounter: Payer: Self-pay | Admitting: Allergy & Immunology

## 2016-12-31 ENCOUNTER — Ambulatory Visit (INDEPENDENT_AMBULATORY_CARE_PROVIDER_SITE_OTHER): Payer: Medicare Other | Admitting: Allergy & Immunology

## 2016-12-31 VITALS — BP 118/78 | HR 65 | Resp 17

## 2016-12-31 DIAGNOSIS — J0100 Acute maxillary sinusitis, unspecified: Secondary | ICD-10-CM | POA: Diagnosis not present

## 2016-12-31 DIAGNOSIS — J3089 Other allergic rhinitis: Secondary | ICD-10-CM | POA: Diagnosis not present

## 2016-12-31 DIAGNOSIS — M3500 Sicca syndrome, unspecified: Secondary | ICD-10-CM

## 2016-12-31 DIAGNOSIS — J4540 Moderate persistent asthma, uncomplicated: Secondary | ICD-10-CM | POA: Diagnosis not present

## 2016-12-31 DIAGNOSIS — J302 Other seasonal allergic rhinitis: Secondary | ICD-10-CM | POA: Diagnosis not present

## 2016-12-31 MED ORDER — AZITHROMYCIN 250 MG PO TABS
ORAL_TABLET | ORAL | 0 refills | Status: DC
Start: 2016-12-31 — End: 2017-03-31

## 2016-12-31 NOTE — Patient Instructions (Addendum)
1. Seasonal and perennial allergic rhinitis - Continue with azelastine 1-2 sprays per nostril daily as needed. - Continue with nasal saline rinses as tolerated.   2. Moderate persistent asthma, unspecified whether complicated - Lung function looked stellar today. - We will not make any changes at this time. - Daily controller medication(s): Singulair 10mg  daily and Symbicort 160/4.41mcg two puffs twice daily with spacer - Prior to physical activity: ProAir 2 puffs 10-15 minutes before physical activity. - Rescue medications: ProAir 4 puffs every 4-6 hours as needed - Asthma control goals:  * Full participation in all desired activities (may need albuterol before activity) * Albuterol use two time or less a week on average (not counting use with activity) * Cough interfering with sleep two time or less a month * Oral steroids no more than once a year * No hospitalizations  3. Acute sinusitis - With your current symptoms and time course, antibiotics are needed: azithromycin 500 mg on the first day, then 250mg  daily for the next four days - Add on nasal saline spray (i.e., Simply Saline) or nasal saline lavage (i.e., NeilMed) as needed prior to medicated nasal sprays. - For thick post nasal drainage, add guaifenesin [phone] mg (Mucinex)  twice daily as needed with adequate hydration.  4. Sjogren's syndrome with costochondritis - I would recommend contacting the Rheumatology group in [place] that previously saw you.   5. No Follow-up on file.   Please inform us of any Emergency Department visits, hospitalizations, or changes in symptoms. Call us before going to the ED for breathing or allergy symptoms since we might be able to fit you in for a sick visit. Feel free to contact us anytime with any questions, problems, or concerns.  It was a pleasure to meet you today! Enjoy the holiday season!  Websites that have reliable patient information: 1. American Academy of Asthma, Allergy, and  Immunology: www.aaaai.org 2. Food Allergy Research and Education (FARE): foodallergy.org 3. Mothers of Asthmatics: http://www.asthmacommunitynetwork.org 4. American College of Allergy, Asthma, and Immunology: www.acaai.org

## 2016-12-31 NOTE — Progress Notes (Addendum)
FOLLOW UP  Date of Service/Encounter:  12/31/16   Assessment:   Moderate persistent asthma, uncomplicated  Seasonal and perennial allergic rhinitis (molds, ragweed, and cat)  Acute sinusitis   Sjogren's syndrome   Asthma Reportables:  Severity: moderate persistent  Risk: low Control: well controlled  Seasonal Influenza Vaccine: yes   Plan/Recommendations:   1. Seasonal and perennial allergic rhinitis - Continue with azelastine 1-2 sprays per nostril daily as needed. - Continue with nasal saline rinses as tolerated.   2. Moderate persistent asthma, uncomplicated - Lung function looked stellar today. - We will not make any changes at this time. - Daily controller medication(s): Singulair 10mg  daily and Symbicort 160/4.34mcg two puffs twice daily with spacer - Prior to physical activity: ProAir 2 puffs 10-15 minutes before physical activity. - Rescue medications: ProAir 4 puffs every 4-6 hours as needed - Asthma control goals:  * Full participation in all desired activities (may need albuterol before activity) * Albuterol use two time or less a week on average (not counting use with activity) * Cough interfering with sleep two time or less a month * Oral steroids no more than once a year * No hospitalizations  3. Acute sinusitis - With your current symptoms and time course, antibiotics are needed: azithromycin 500 mg on the first day, then 250mg  daily for the next four days - Add on nasal saline spray (i.e., Simply Saline) or nasal saline lavage (i.e., NeilMed) as needed prior to medicated nasal sprays. - For thick post nasal drainage, add guaifenesin [phone] mg (Mucinex)  twice daily as needed with adequate hydration.  4. Sjogren's syndrome with costochondritis - I would recommend contacting the Rheumatology group in [place] that previously saw you.  - She had a host of complaints, most of which were related to her rheumatologic issues. - Having a dedicated  rheumatologist locally instead of splitting her time between [place] and here is not ideal for continuity of care purposes.   5. Follow up in six months or earlier if needed.      Subjective:   Angela Wong is a 76 y.o. female presenting today for follow up of  Chief Complaint  Patient presents with  . Asthma    Angela Wong: Patient Active Problem List   Diagnosis Date Noted  . Seasonal and perennial allergic rhinitis 12/31/2016  . Allergic rhinitis with a nonallergic component 10/01/2016  . Pruritus 10/01/2016  . 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 Steroid-induced osteopenia 07/11/2016  . Hyperparathyroidism ([place]) 01/16/2016  . Iron deficiency anemia 12/18/2015  . Insomnia 12/18/2015  . Hypothyroidism 12/18/2015  . Rotator cuff tear 03/23/2014  . Osteoarthritis   . Moderate persistent asthma   . Ovarian cancer ([place])   . Fibromyalgia   . RLS (restless legs syndrome)   . Cerebral vasculitis   . Sjogren's syndrome ([place])   . Raynaud's disease   . MVP (mitral valve prolapse)   . IBS (irritable bowel syndrome)   . Congestive dilated cardiomyopathy ([place]) 02/09/2014  . Dyspnea 12/13/2013  . Hyperlipidemia 11/26/2013  . Malaise and fatigue 11/26/2013  . Multiple drug allergies 11/02/2013  . History of ovarian cancer 10/01/2013  . GERD (gastroesophageal reflux disease) 10/01/2013  . Benign essential HTN 10/01/2013  . Atrial fibrillation, permanent ([place]) 07/27/2013    History obtained from: chart review and patient.  Angela Wong's Primary Care Provider is [name], FNP.     Angela Wong is a 76 y.o. female presenting  for a follow up visit. She was last seen in September 2018 by Dr. Verlin Fester. At that time, she had testing that was positive to molds, ragweed, and cat. She was starting on azelastine nasal spray 1-2 sprays per nostril twice daily with Singulair. She was also continued on Allegra as a means of controlling her pruritis.  She has a history of asthma and was continued on Symbicort 160/4.5 two puffs twice daily daily.   Since the last visit, she has mostly done well. Today, she is endorsing fatigue. She has a history of Sjogren's syndrome with costochondritis and has some arthritis in her lower extremities and her right shoulder. However, with regards to her asthma, she is reporting some problems with her stature. She hunches over due to pain, which seems to make her breathing worse. She does not current see a rheumatologist, and has not seen one in a number of years. Her previous rheumatologist was in [place].   She does have a cat and lives in her garage. This was a stray cat, but does not come into the home. She also had [place] hens which moved in from the neighbor's home. She also one dog and one recently died. She is not using any of her allergic rhinitis symptoms on a regular basis. She will occasionally use her azelastine, but even more rarely use her nasal saline rinses. She does report sinus pressure and discolored mucous production which has worsened over the past six weeks. She has remained afebrile.   Otherwise, there have been no changes to her past medical history, surgical history, family history, or social history.    Review of Systems: a 14-point review of systems is pertinent for what is mentioned in HPI.  Otherwise, all other systems were negative. Constitutional: negative other than that listed in the HPI Eyes: negative other than that listed in the HPI Ears, nose, mouth, throat, and face: negative other than that listed in the HPI Respiratory: negative other than that listed in the HPI Cardiovascular: negative other than that listed in the HPI Gastrointestinal: negative other than that listed in the HPI Genitourinary: negative other than that listed in the HPI Integument: negative other than that listed in the HPI Hematologic: negative other than that listed in the HPI Musculoskeletal:  negative other than that listed in the HPI Neurological: negative other than that listed in the HPI Allergy/Immunologic: negative other than that listed in the HPI    Objective:   Blood pressure 118/78, pulse 65, resp. rate 17, SpO2 95 %. There is no height or weight on file to calculate BMI.   Physical Exam:  General: Alert, interactive, in no acute distress. Pleasant and very talkative female.  Eyes: No conjunctival injection bilaterally, no discharge on the right, no discharge on the left and no Horner-Trantas dots present. PERRL bilaterally. EOMI without pain. No photophobia.  Ears: Right TM pearly gray with normal light reflex, Left TM pearly gray with normal light reflex, Right TM intact without perforation and Left TM intact without perforation.  Nose/Throat: External nose within normal limits and septum midline. Turbinates edematous with clear discharge. Posterior oropharynx erythematous without cobblestoning in the posterior oropharynx. Tonsils 2+ without exudates.  Tongue without thrush. Adenopathy: no enlarged lymph nodes appreciated in the anterior cervical, occipital, axillary, epitrochlear, inguinal, or popliteal regions. Lungs: Clear to auscultation without wheezing, rhonchi or rales. No increased work of breathing. CV: Normal S1/S2. No murmurs. Capillary refill <2 seconds.  Skin: Warm and dry, without lesions or rashes. Neuro:  Grossly intact. No focal deficits appreciated. Responsive to questions.  Diagnostic studies:   Spirometry: results normal (FEV1: 1.64/86%, FVC: 2.38/102%, FEV1/FVC: 69%).    Spirometry consistent with normal pattern.  Allergy Studies: none     Salvatore Marvel, MD [facility]

## 2017-01-15 ENCOUNTER — Ambulatory Visit (INDEPENDENT_AMBULATORY_CARE_PROVIDER_SITE_OTHER): Payer: Medicare Other

## 2017-01-15 ENCOUNTER — Encounter: Payer: Self-pay | Admitting: Nurse Practitioner

## 2017-01-15 ENCOUNTER — Ambulatory Visit (INDEPENDENT_AMBULATORY_CARE_PROVIDER_SITE_OTHER): Payer: Medicare Other | Admitting: Nurse Practitioner

## 2017-01-15 VITALS — BP 140/81 | HR 58 | Temp 96.8°F | Ht 64.0 in | Wt 142.0 lb

## 2017-01-15 DIAGNOSIS — K5900 Constipation, unspecified: Secondary | ICD-10-CM

## 2017-01-15 DIAGNOSIS — M1990 Unspecified osteoarthritis, unspecified site: Secondary | ICD-10-CM | POA: Diagnosis not present

## 2017-01-15 DIAGNOSIS — R1031 Right lower quadrant pain: Secondary | ICD-10-CM

## 2017-01-15 NOTE — Patient Instructions (Signed)

## 2017-01-15 NOTE — Progress Notes (Signed)
   Subjective:    Patient ID: Angela Wong, female    DOB: [birth year], 77 y.o.   MRN: [id number]  HPI Patient come sin today c/o - all over joint pain for years. She is seeing Dr. Maureen Ralphs and he usually injects her hips and knees. Has not seen rheumatologist in several years and needs referral. Would like to see Dr. Lenna Gilford. - has had diarrhea intermittently since thanksgiving. Has history of IBS and is currently on no medicines. She usually can control with diet but diet has not been good lately. Having slight right sided pain intermittently. Dr. Hilarie Fredrickson in [place]. Was seen in September and is due in March.    Review of Systems  Constitutional: Negative.   Respiratory: Negative.   Cardiovascular: Negative.   Gastrointestinal: Positive for abdominal pain and nausea. Negative for diarrhea and vomiting.  Genitourinary: Negative.   Musculoskeletal: Positive for arthralgias and back pain.  Neurological: Negative.   Psychiatric/Behavioral: Negative.   All other systems reviewed and are negative.      Objective:   Physical Exam  Constitutional: She is oriented to person, place, and time. She appears well-developed and well-nourished. No distress.  Cardiovascular: Normal rate.  Pulmonary/Chest: Effort normal.  Abdominal: Soft. There is tenderness (right lower quadrant pain).  Musculoskeletal:  Moves very slowly due to all over pain  Neurological: She is alert and oriented to person, place, and time.  Skin: Skin is warm.  Psychiatric: She has a normal mood and affect. Her behavior is normal. Judgment and thought content normal.   BP 140/81   Pulse (!) 58   Temp (!) 96.8 F (36 C) (Oral)   Ht 5\' 4"  (1.626 m)   Wt 142 lb (64.4 kg)   BMI 24.37 kg/m   kub- moderate stool burden throughout colon-Preliminary reading by Ronnald Collum, FNP  [facility]       Assessment & Plan:  1. Osteoarthritis, unspecified osteoarthritis type, unspecified site - Ambulatory referral to  Rheumatology  2. Right lower quadrant pain - DG Abd 1 View; Future  3. Constipation, unspecified constipation type miralax dialy Increase fiber in diet Force fluids Keep follow up appointment with gastro  Mary-Margaret Hassell Done, FNP

## 2017-01-21 ENCOUNTER — Other Ambulatory Visit: Payer: Self-pay | Admitting: *Deleted

## 2017-01-21 ENCOUNTER — Encounter: Payer: Self-pay | Admitting: *Deleted

## 2017-01-21 DIAGNOSIS — J3089 Other allergic rhinitis: Secondary | ICD-10-CM

## 2017-01-21 MED ORDER — AZELASTINE HCL 0.1 % NA SOLN: 2.0000 | Freq: Two times a day (BID) | NASAL | 1 refills | Status: DC

## 2017-01-21 NOTE — Progress Notes (Signed)
This encounter was created in error - please disregard.

## 2017-01-24 ENCOUNTER — Ambulatory Visit (INDEPENDENT_AMBULATORY_CARE_PROVIDER_SITE_OTHER): Payer: Medicare Other | Admitting: Pharmacist Clinician (PhC)/ Clinical Pharmacy Specialist

## 2017-01-24 DIAGNOSIS — I4891 Unspecified atrial fibrillation: Secondary | ICD-10-CM

## 2017-01-24 DIAGNOSIS — I4821 Permanent atrial fibrillation: Secondary | ICD-10-CM

## 2017-01-24 DIAGNOSIS — I482 Chronic atrial fibrillation: Secondary | ICD-10-CM

## 2017-01-24 LAB — COAGUCHEK XS/INR WAIVED
INR: 2.2 — ABNORMAL HIGH (ref 0.9–1.1)
Prothrombin Time: 25.9 s

## 2017-01-24 NOTE — Patient Instructions (Addendum)
  Description   Continue warfarin dose of 4mg  on Mondays, Wednesdays and Fridays. Take 2mg  all other days.  INR was 2.2 today (goal is 2.0 to 3.0)

## 2017-01-29 DIAGNOSIS — D8989 Other specified disorders involving the immune mechanism, not elsewhere classified: Secondary | ICD-10-CM | POA: Diagnosis not present

## 2017-01-29 DIAGNOSIS — M79643 Pain in unspecified hand: Secondary | ICD-10-CM | POA: Diagnosis not present

## 2017-01-29 DIAGNOSIS — M2550 Pain in unspecified joint: Secondary | ICD-10-CM | POA: Diagnosis not present

## 2017-01-29 DIAGNOSIS — M19042 Primary osteoarthritis, left hand: Secondary | ICD-10-CM | POA: Diagnosis not present

## 2017-01-29 DIAGNOSIS — M79642 Pain in left hand: Secondary | ICD-10-CM | POA: Diagnosis not present

## 2017-01-29 DIAGNOSIS — M3500 Sicca syndrome, unspecified: Secondary | ICD-10-CM | POA: Diagnosis not present

## 2017-01-29 DIAGNOSIS — M810 Age-related osteoporosis without current pathological fracture: Secondary | ICD-10-CM | POA: Diagnosis not present

## 2017-01-29 DIAGNOSIS — R5382 Chronic fatigue, unspecified: Secondary | ICD-10-CM | POA: Diagnosis not present

## 2017-01-29 DIAGNOSIS — I7300 Raynaud's syndrome without gangrene: Secondary | ICD-10-CM | POA: Diagnosis not present

## 2017-01-29 DIAGNOSIS — M19041 Primary osteoarthritis, right hand: Secondary | ICD-10-CM | POA: Diagnosis not present

## 2017-01-29 DIAGNOSIS — M1990 Unspecified osteoarthritis, unspecified site: Secondary | ICD-10-CM | POA: Diagnosis not present

## 2017-01-29 DIAGNOSIS — M79641 Pain in right hand: Secondary | ICD-10-CM | POA: Diagnosis not present

## 2017-01-29 DIAGNOSIS — G8929 Other chronic pain: Secondary | ICD-10-CM | POA: Diagnosis not present

## 2017-02-03 DIAGNOSIS — Z45010 Encounter for checking and testing of cardiac pacemaker pulse generator [battery]: Secondary | ICD-10-CM | POA: Diagnosis not present

## 2017-02-03 DIAGNOSIS — Z45018 Encounter for adjustment and management of other part of cardiac pacemaker: Secondary | ICD-10-CM | POA: Diagnosis not present

## 2017-02-06 DIAGNOSIS — M1712 Unilateral primary osteoarthritis, left knee: Secondary | ICD-10-CM | POA: Diagnosis not present

## 2017-02-06 DIAGNOSIS — M7062 Trochanteric bursitis, left hip: Secondary | ICD-10-CM | POA: Diagnosis not present

## 2017-02-25 DIAGNOSIS — I482 Chronic atrial fibrillation: Secondary | ICD-10-CM | POA: Diagnosis not present

## 2017-02-25 DIAGNOSIS — I10 Essential (primary) hypertension: Secondary | ICD-10-CM | POA: Diagnosis not present

## 2017-02-25 DIAGNOSIS — I472 Ventricular tachycardia: Secondary | ICD-10-CM | POA: Diagnosis not present

## 2017-02-25 DIAGNOSIS — I493 Ventricular premature depolarization: Secondary | ICD-10-CM | POA: Diagnosis not present

## 2017-02-25 DIAGNOSIS — I495 Sick sinus syndrome: Secondary | ICD-10-CM | POA: Diagnosis not present

## 2017-02-25 DIAGNOSIS — I4891 Unspecified atrial fibrillation: Secondary | ICD-10-CM | POA: Diagnosis not present

## 2017-02-25 DIAGNOSIS — Z950 Presence of cardiac pacemaker: Secondary | ICD-10-CM | POA: Diagnosis not present

## 2017-02-25 DIAGNOSIS — I200 Unstable angina: Secondary | ICD-10-CM | POA: Diagnosis not present

## 2017-02-27 ENCOUNTER — Other Ambulatory Visit: Payer: Self-pay | Admitting: Rheumatology

## 2017-02-27 DIAGNOSIS — M2550 Pain in unspecified joint: Secondary | ICD-10-CM | POA: Diagnosis not present

## 2017-02-27 DIAGNOSIS — I776 Arteritis, unspecified: Secondary | ICD-10-CM | POA: Diagnosis not present

## 2017-02-27 DIAGNOSIS — M25562 Pain in left knee: Secondary | ICD-10-CM | POA: Diagnosis not present

## 2017-02-27 DIAGNOSIS — M25462 Effusion, left knee: Secondary | ICD-10-CM | POA: Diagnosis not present

## 2017-02-27 DIAGNOSIS — I7300 Raynaud's syndrome without gangrene: Secondary | ICD-10-CM | POA: Diagnosis not present

## 2017-02-27 DIAGNOSIS — M1990 Unspecified osteoarthritis, unspecified site: Secondary | ICD-10-CM | POA: Diagnosis not present

## 2017-02-27 DIAGNOSIS — R5382 Chronic fatigue, unspecified: Secondary | ICD-10-CM | POA: Diagnosis not present

## 2017-02-27 DIAGNOSIS — M79643 Pain in unspecified hand: Secondary | ICD-10-CM | POA: Diagnosis not present

## 2017-02-27 DIAGNOSIS — M3500 Sicca syndrome, unspecified: Secondary | ICD-10-CM | POA: Diagnosis not present

## 2017-02-27 DIAGNOSIS — M797 Fibromyalgia: Secondary | ICD-10-CM | POA: Diagnosis not present

## 2017-02-28 ENCOUNTER — Other Ambulatory Visit: Payer: Self-pay | Admitting: Rheumatology

## 2017-02-28 DIAGNOSIS — I776 Arteritis, unspecified: Secondary | ICD-10-CM

## 2017-03-03 ENCOUNTER — Other Ambulatory Visit: Payer: Self-pay

## 2017-03-03 MED ORDER — PREDNISONE 5 MG PO TABS: 5.0000 mg | ORAL_TABLET | Freq: Every day | ORAL | 0 refills | Status: DC

## 2017-03-03 NOTE — Telephone Encounter (Signed)
Last seen 01/15/17 MMM 

## 2017-03-07 ENCOUNTER — Ambulatory Visit (INDEPENDENT_AMBULATORY_CARE_PROVIDER_SITE_OTHER): Payer: Medicare Other | Admitting: Pharmacist Clinician (PhC)/ Clinical Pharmacy Specialist

## 2017-03-07 DIAGNOSIS — I4891 Unspecified atrial fibrillation: Secondary | ICD-10-CM | POA: Diagnosis not present

## 2017-03-07 DIAGNOSIS — I4821 Permanent atrial fibrillation: Secondary | ICD-10-CM

## 2017-03-07 DIAGNOSIS — I482 Chronic atrial fibrillation: Secondary | ICD-10-CM | POA: Diagnosis not present

## 2017-03-07 LAB — COAGUCHEK XS/INR WAIVED
INR: 2.5 — AB (ref 0.9–1.1)
PROTHROMBIN TIME: 30.6 s

## 2017-03-07 MED ORDER — WARFARIN SODIUM 1 MG PO TABS: ORAL_TABLET | ORAL | 3 refills | Status: DC

## 2017-03-07 MED ORDER — IRON POLYSACCH CMPLX-B12-FA 150-0.025-1 MG PO CAPS: 1.0000 | ORAL_CAPSULE | Freq: Every day | ORAL | 3 refills | Status: DC

## 2017-03-07 MED ORDER — LOSARTAN POTASSIUM 100 MG PO TABS: 100.0000 mg | ORAL_TABLET | Freq: Every day | ORAL | 0 refills | Status: DC

## 2017-03-07 NOTE — Patient Instructions (Signed)
Description   Continue warfarin dose of 4mg  on Mondays, Wednesdays and Fridays. Take 2mg  all other days.  INR was 2.5 today (goal is 2.0 to 3.0)  Perfect reading today

## 2017-03-11 ENCOUNTER — Other Ambulatory Visit: Payer: Self-pay | Admitting: *Deleted

## 2017-03-11 MED ORDER — LOSARTAN POTASSIUM 100 MG PO TABS: 100.0000 mg | ORAL_TABLET | Freq: Every day | ORAL | 0 refills | Status: DC

## 2017-03-20 ENCOUNTER — Encounter: Payer: Self-pay | Admitting: Nurse Practitioner

## 2017-03-20 ENCOUNTER — Encounter: Payer: Self-pay | Admitting: *Deleted

## 2017-03-20 DIAGNOSIS — I472 Ventricular tachycardia: Secondary | ICD-10-CM | POA: Diagnosis not present

## 2017-03-20 DIAGNOSIS — I495 Sick sinus syndrome: Secondary | ICD-10-CM | POA: Diagnosis not present

## 2017-03-20 DIAGNOSIS — I482 Chronic atrial fibrillation: Secondary | ICD-10-CM | POA: Diagnosis not present

## 2017-03-20 DIAGNOSIS — I200 Unstable angina: Secondary | ICD-10-CM | POA: Diagnosis not present

## 2017-03-24 ENCOUNTER — Other Ambulatory Visit: Payer: Self-pay | Admitting: *Deleted

## 2017-03-24 DIAGNOSIS — G4700 Insomnia, unspecified: Secondary | ICD-10-CM

## 2017-03-24 MED ORDER — TRAZODONE HCL 150 MG PO TABS: 150.0000 mg | ORAL_TABLET | Freq: Every day | ORAL | 0 refills | Status: DC

## 2017-03-31 ENCOUNTER — Encounter: Payer: Self-pay | Admitting: Family

## 2017-03-31 ENCOUNTER — Ambulatory Visit (INDEPENDENT_AMBULATORY_CARE_PROVIDER_SITE_OTHER): Payer: Medicare Other | Admitting: Family

## 2017-03-31 VITALS — BP 136/78 | HR 68 | Temp 97.4°F | Ht 64.0 in | Wt 142.0 lb

## 2017-03-31 DIAGNOSIS — W57XXXA Bitten or stung by nonvenomous insect and other nonvenomous arthropods, initial encounter: Secondary | ICD-10-CM

## 2017-03-31 DIAGNOSIS — S40912A Unspecified superficial injury of left shoulder, initial encounter: Secondary | ICD-10-CM

## 2017-03-31 NOTE — Patient Instructions (Signed)

## 2017-03-31 NOTE — Progress Notes (Signed)
   Subjective:    Patient ID: Angela Wong, female    DOB: 1940-04-07, 77 y.o.   MRN: [id number]  HPI PT presents to the office today for a tick bite she noticed on Friday evening. PT states she was "itching" and removed the tick on Friday even, but has noticed more redness and itching.   PT is allergic to doxycycline. Denies any fevers, new joint pain,    Review of Systems  Musculoskeletal: Positive for arthralgias, back pain and joint swelling.  Skin: Positive for rash.  Neurological: Positive for dizziness.  Psychiatric/Behavioral: Positive for decreased concentration. The patient is nervous/anxious.   All other systems reviewed and are negative.      Objective:   Physical Exam  Constitutional: She is oriented to person, place, and time. She appears well-developed and well-nourished. No distress.  HENT:  Head: Normocephalic and atraumatic.  Right Ear: External ear normal.  Left Ear: External ear normal.  Nose: Nose normal.  Mouth/Throat: Oropharynx is clear and moist.  Eyes: Pupils are equal, round, and reactive to light.  Neck: Normal range of motion. Neck supple. No thyromegaly present.  Cardiovascular: Normal rate, regular rhythm, normal heart sounds and intact distal pulses.  No murmur heard. Pulmonary/Chest: Effort normal and breath sounds normal. No respiratory distress. She has no wheezes.  Abdominal: Soft. Bowel sounds are normal. She exhibits no distension. There is no tenderness.  Musculoskeletal: Normal range of motion. She exhibits no edema or tenderness.  Neurological: She is alert and oriented to person, place, and time.  Skin: Skin is warm and dry. Rash noted.  Small erythemas papule of 0.5X0.4 cm under left axilla   Psychiatric: She has a normal mood and affect. Her behavior is normal. Judgment and thought content normal.  Vitals reviewed.   BP 136/78   Pulse 68   Temp (!) 97.4 F (36.3 C) (Oral)   Ht 5\' 4"  (1.626 m)   Wt 142 lb (64.4 kg)   BMI  24.37 kg/m       Assessment & Plan:  1. Tick bite, initial encounter -Pt to report any new fever, joint pain, or rash -Wear protective clothing while outside- Long sleeves and long pants -Put insect repellent on all exposed skin and along clothing -Take a shower as soon as possible after being outside RTO prn     [name], FNP

## 2017-04-03 DIAGNOSIS — B351 Tinea unguium: Secondary | ICD-10-CM | POA: Diagnosis not present

## 2017-04-03 DIAGNOSIS — L718 Other rosacea: Secondary | ICD-10-CM | POA: Diagnosis not present

## 2017-04-03 DIAGNOSIS — D235 Other benign neoplasm of skin of trunk: Secondary | ICD-10-CM | POA: Diagnosis not present

## 2017-04-03 DIAGNOSIS — Z85828 Personal history of other malignant neoplasm of skin: Secondary | ICD-10-CM | POA: Diagnosis not present

## 2017-04-03 DIAGNOSIS — L309 Dermatitis, unspecified: Secondary | ICD-10-CM | POA: Diagnosis not present

## 2017-04-03 DIAGNOSIS — L821 Other seborrheic keratosis: Secondary | ICD-10-CM | POA: Diagnosis not present

## 2017-04-03 DIAGNOSIS — L853 Xerosis cutis: Secondary | ICD-10-CM | POA: Diagnosis not present

## 2017-04-03 DIAGNOSIS — D1801 Hemangioma of skin and subcutaneous tissue: Secondary | ICD-10-CM | POA: Diagnosis not present

## 2017-04-04 ENCOUNTER — Encounter: Payer: Self-pay | Admitting: Internal Medicine

## 2017-04-04 ENCOUNTER — Ambulatory Visit (INDEPENDENT_AMBULATORY_CARE_PROVIDER_SITE_OTHER): Payer: Medicare Other | Admitting: Internal Medicine

## 2017-04-04 VITALS — BP 130/76 | HR 81 | Ht 64.0 in | Wt 142.0 lb

## 2017-04-04 DIAGNOSIS — Z860100 Personal history of colon polyps, unspecified: Secondary | ICD-10-CM

## 2017-04-04 DIAGNOSIS — Z7901 Long term (current) use of anticoagulants: Secondary | ICD-10-CM | POA: Diagnosis not present

## 2017-04-04 DIAGNOSIS — K219 Gastro-esophageal reflux disease without esophagitis: Secondary | ICD-10-CM

## 2017-04-04 DIAGNOSIS — K589 Irritable bowel syndrome without diarrhea: Secondary | ICD-10-CM

## 2017-04-04 MED ORDER — ESOMEPRAZOLE MAGNESIUM 40 MG PO CPDR: 40.0000 mg | DELAYED_RELEASE_CAPSULE | Freq: Two times a day (BID) | ORAL | 3 refills | Status: DC

## 2017-04-04 NOTE — Progress Notes (Signed)
   Subjective:    Patient ID: Angela Wong, female    DOB: [birth year], 77 y.o.   MRN: [id number]  HPI Angela Wong is a 77 year old female with a history of GERD, IBS, colon polyps, atrial fibrillation on warfarin, COPD, Sjogren's syndrome, remote ovarian cancer status post hysterectomy and oophorectomy, hyperparathyroidism, fibromyalgia who is here for follow-up.  She was initially seen on 09/23/2016.  She reports that her reflux and LPR symptoms have improved with twice daily Nexium.  She continues Nexium 40 mg twice daily AC.  Occasionally she will miss the second dose because she is eating smaller more frequent meals and was under the impression the dose needed to be 1 hour before eating.  No liquid or solid food dysphagia.  No odynophagia.  Bowel movements have been slightly more regular for her and she attributes this to stress.  She had her sister die recently and also lost 1 of her pet dogs.  She is also had stress over some real estate transactions.  Her stools have been somewhat smaller and a little more inconsistent.  No incontinence.  No bleeding or melena.  She has had some mild left-sided abdominal and left flank pain which she has attributed to her left hip pain which is chronic.   Review of Systems As per HPI, otherwise negative  Current Medications, Allergies, Past Medical History, Past Surgical History, Family History and Social History were reviewed in [employer] record.     Objective:   Physical Exam There were no vitals taken for this visit. Constitutional: Well-developed and well-nourished. No distress. HEENT: Normocephalic and atraumatic.  Conjunctivae are normal.  No scleral icterus. Neck: Neck supple. Trachea midline. Cardiovascular: Normal rate, regular rhythm  Pulmonary/chest: Effort normal and breath sounds normal. No wheezing, rales or rhonchi. Abdominal: Soft, nontender, nondistended. Bowel sounds active throughout.  Extremities: no  clubbing, cyanosis, or edema Neurological: Alert and oriented to person place and time. Skin: Skin is warm and dry. Psychiatric: Normal mood and affect. Behavior is normal.      Assessment & Plan:  77 year old female with a history of GERD, IBS, colon polyps, atrial fibrillation on warfarin, COPD, Sjogren's syndrome, remote ovarian cancer status post hysterectomy and oophorectomy, hyperparathyroidism, fibromyalgia who is here for follow-up.  1.  GERD --under control with Nexium 40 mg twice daily AC.  She will continue this dose.  No alarm symptoms.  2.  IBS --slightly more of an issue recently with stress over the death in her family.  I asked that she had Benefiber 1 tablespoon daily to help bulk her stool and hopefully make her stools more consistent and lead to more complete evacuation.  3.  History of colon polyps --surveillance colonoscopy would be due around September 2019.  This would require holding her anticoagulation.  I think we need to think of the risk versus benefit of this test for her based on her medical conditions.  We will discuss this again when I see her in about 6 months.  25 minutes spent with the patient today. Greater than 50% was spent in counseling and coordination of care with the patient

## 2017-04-04 NOTE — Patient Instructions (Addendum)
Normal BMI (Body Mass Index- based on height and weight) is between 23 and 30. Your BMI today is Body mass index is 24.37 kg/m. Marland Kitchen Please consider follow up  regarding your BMI with your Primary Care Provider.  We have sent  medications to your pharmacy for you to pick up at your convenience:  Please start Benifiber 1tsp daily. This is an over the counter medication.  Follow up in 6 months with Dr Hilarie Fredrickson

## 2017-04-11 DIAGNOSIS — M797 Fibromyalgia: Secondary | ICD-10-CM | POA: Diagnosis not present

## 2017-04-11 DIAGNOSIS — I776 Arteritis, unspecified: Secondary | ICD-10-CM | POA: Diagnosis not present

## 2017-04-11 DIAGNOSIS — M25462 Effusion, left knee: Secondary | ICD-10-CM | POA: Diagnosis not present

## 2017-04-11 DIAGNOSIS — M25562 Pain in left knee: Secondary | ICD-10-CM | POA: Diagnosis not present

## 2017-04-11 DIAGNOSIS — I7300 Raynaud's syndrome without gangrene: Secondary | ICD-10-CM | POA: Diagnosis not present

## 2017-04-11 DIAGNOSIS — R5382 Chronic fatigue, unspecified: Secondary | ICD-10-CM | POA: Diagnosis not present

## 2017-04-11 DIAGNOSIS — M79643 Pain in unspecified hand: Secondary | ICD-10-CM | POA: Diagnosis not present

## 2017-04-11 DIAGNOSIS — M1180 Other specified crystal arthropathies, unspecified site: Secondary | ICD-10-CM | POA: Diagnosis not present

## 2017-04-11 DIAGNOSIS — M1990 Unspecified osteoarthritis, unspecified site: Secondary | ICD-10-CM | POA: Diagnosis not present

## 2017-04-11 DIAGNOSIS — M2550 Pain in unspecified joint: Secondary | ICD-10-CM | POA: Diagnosis not present

## 2017-04-11 DIAGNOSIS — M3500 Sicca syndrome, unspecified: Secondary | ICD-10-CM | POA: Diagnosis not present

## 2017-04-23 ENCOUNTER — Encounter: Payer: Self-pay | Admitting: Diagnostic Neuroimaging

## 2017-04-23 ENCOUNTER — Telehealth: Payer: Self-pay | Admitting: Family

## 2017-04-23 ENCOUNTER — Ambulatory Visit (INDEPENDENT_AMBULATORY_CARE_PROVIDER_SITE_OTHER): Payer: Medicare Other | Admitting: Diagnostic Neuroimaging

## 2017-04-23 VITALS — BP 135/81 | HR 73 | Ht 64.0 in | Wt 139.8 lb

## 2017-04-23 DIAGNOSIS — M3500 Sicca syndrome, unspecified: Secondary | ICD-10-CM

## 2017-04-23 DIAGNOSIS — F419 Anxiety disorder, unspecified: Secondary | ICD-10-CM

## 2017-04-23 DIAGNOSIS — G43109 Migraine with aura, not intractable, without status migrainosus: Secondary | ICD-10-CM | POA: Diagnosis not present

## 2017-04-23 DIAGNOSIS — M797 Fibromyalgia: Secondary | ICD-10-CM | POA: Diagnosis not present

## 2017-04-23 DIAGNOSIS — G4700 Insomnia, unspecified: Secondary | ICD-10-CM

## 2017-04-23 MED ORDER — TRAZODONE HCL 150 MG PO TABS: 150.0000 mg | ORAL_TABLET | Freq: Every day | ORAL | 0 refills | Status: DC

## 2017-04-23 NOTE — Progress Notes (Signed)
[facility]  PATIENT: Angela Wong DOB: [birth year]  REFERRING CLINICIAN: Joellyn Rued, MD HISTORY FROM: patient and chart review  REASON FOR VISIT: new consult    HISTORICAL  CHIEF COMPLAINT:  Chief Complaint  Patient presents with  . 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  History of Sjogren's syndrome, hypertension, hypercholesterolemia, heart disease, atrial for ablation, anxiety, depression, migraine, fibromyalgia.  In the 1980s patient was diagnosed with Sjogren's syndrome, confirmed on a lip biopsy.  She was treated with prednisone and other immunosuppressive medications.  Also in the 1980s patient had a history of migraine headaches with visual aura.  She describes left-sided headache, left eye vision loss, severe pounding headache with nausea, vomiting, photophobia and phonophobia.  Triggering factors include onions and stress.  Headaches can last up to 3 days of time.  Around 2003 patient had similar migraine auras where she would see a "step-like pattern" but did not have the headaches.  Also she was having cognitive difficulties, spelling and attention difficulties, word finding difficulties.  Initially she was diagnosed as having possible CNS vasculitis and treated empirically with prednisone and methotrexate.  Follow-up evaluation by neurologist in 2012 suggested that patient did not have vasculitis of the CNS and rather had migraine aura and incidental left frontal developmental venous anomaly.  In the last 6-12 months patient has been under extreme stress, with more pain symptoms, physical limitations, lack of support from her family.   Patient also having more issues with cognitive difficulty, word finding difficulty, attention problems.  Patient saw a rheumatologist who  ordered MRI of the brain, but has not been done yet as scan needs to be coordinated with her pacemaker history.    REVIEW OF SYSTEMS: Full 14 system review of systems performed and negative with exception of: Fatigue blurred vision memory loss headache numbness restless leg tremor anxiety racing thoughts allergies hearing loss trouble swallowing.  ALLERGIES: Allergies  Allergen Reactions  . Cephalosporins Hives and Shortness Of Breath  . Ciprofloxacin Hives and Shortness Of Breath  . Doxycycline Hives and Shortness Of Breath  . Horse-Derived Products Anaphylaxis  . Ketek [Telithromycin] Palpitations    Chest discomfort  . Nitrofuran Derivatives Anaphylaxis and Hives    blisters  . Nitrous Oxide Nausea And Vomiting    Severe due to Sjogrens (Auto-Immune Disease)  . Other Anaphylaxis    ALLERGY TO HORSE SERUM  . Penicillins Hives and Shortness Of Breath  . Sulfa Antibiotics Hives and Shortness Of Breath  . Talwin [Pentazocine] Other (See Comments)    Other reaction(s): Mental Status Changes (intolerance) Altered Mental Status  Altered Mental Status   . Trovan [Alatrofloxacin] Palpitations, Other (See Comments) and Anaphylaxis    Chest pain, dizziness, irregular pulse  . Amlodipine Swelling  . Calcium Channel Blockers     Respiratory distress  . Carvedilol Other (See Comments)    Dizziness, "joint pain, depression"  . Codeine Nausea Only  . Cymbalta [Duloxetine Hcl] Swelling  . Diltiazem     Swollen throat   . Diovan [Valsartan] Swelling  . Lisinopril Swelling  . Tramadol Nausea Only  . Zoloft [Sertraline Hcl]   . Loteprednol Etabonate Rash    HOME MEDICATIONS: Outpatient Medications Prior to Visit  Medication Sig Dispense Refill  . albuterol (PROVENTIL HFA;VENTOLIN HFA) 108 (90 Base) MCG/ACT inhaler Inhale 2  puffs into the lungs every 6 (six) hours as needed for wheezing. 1 Inhaler 2  . atenolol (TENORMIN) 25 MG tablet Take 12.5 mg by mouth daily.  6  . B Complex-C  (B-COMPLEX WITH VITAMIN C) tablet Take 1 tablet by mouth daily.    . budesonide-formoterol (SYMBICORT) 160-4.5 MCG/ACT inhaler Inhale 2 puffs into the lungs 2 (two) times daily. 3 Inhaler 2  . bumetanide (BUMEX) 0.5 MG tablet Take 1 tablet (0.5 mg total) by mouth every other day. 45 tablet 1  . bumetanide (BUMEX) 1 MG tablet Take 1 tablet (1 mg total) by mouth every other day. On opposite day of 0.5mg  of bumetadnide 45 tablet 1  . Cholecalciferol (VITAMIN D) 2000 UNITS CAPS Take 2 capsules by mouth every evening.     . Cyanocobalamin (VITAMIN B 12 PO) Take 500 mg by mouth daily.    . cycloSPORINE (RESTASIS) 0.05 % ophthalmic emulsion Place 1 drop into both eyes 2 (two) times daily. 60 mL 2  . docusate sodium (COLACE) 100 MG capsule Take 400 mg by mouth daily.     Marland Kitchen esomeprazole (NEXIUM) 40 MG capsule Take 1 capsule (40 mg total) by mouth 2 (two) times daily before a meal. 180 capsule 3  . fexofenadine (ALLEGRA) 180 MG tablet Take 180 mg by mouth every morning.     . Iron Polysacch Cmplx-B12-FA (POLY-IRON 150 FORTE) 150-0.025-1 MG CAPS Take 1 tablet by mouth daily. 90 each 3  . levothyroxine (SYNTHROID, LEVOTHROID) 100 MCG tablet TAKE 1 TABLET BY MOUTH EVERY DAY 90 tablet 3  . losartan (COZAAR) 100 MG tablet Take 1 tablet (100 mg total) by mouth daily. 90 tablet 0  . Magnesium 200 MG TABS Take 1 tablet by mouth daily.    . metroNIDAZOLE (METROGEL) 1 % gel Apply topically daily. 45 g 0  . montelukast (SINGULAIR) 10 MG tablet Take 1 tablet (10 mg total) by mouth at bedtime. 90 tablet 0  . Olopatadine HCl 0.2 % SOLN Apply 1 drop to eye daily. 2.5 mL 2  . predniSONE (DELTASONE) 5 MG tablet Take 1 tablet (5 mg total) by mouth daily. 90 tablet 0  . ranitidine (ZANTAC) 150 MG tablet One at bedtime 90 tablet 1  . Simethicone (GAS-X EXTRA STRENGTH) 125 MG CAPS Take 2 capsules by mouth daily as needed (for relief).     . traZODone (DESYREL) 150 MG tablet Take 1 tablet (150 mg total) by mouth at bedtime. 90  tablet 0  . warfarin (COUMADIN) 1 MG tablet TAKE 2 TO 4 TABLETS (= 2 TO 4MG ) BY MOUTH ONCE DAILY AS DIRECTED BY ANTICOGULATION CLINIC. 350 tablet 3  . azelastine (ASTELIN) 0.1 % nasal spray Place 2 sprays into both nostrils 2 (two) times daily. (Patient not taking: Reported on 04/23/2017) 90 mL 1  . PREVIDENT 5000 DRY MOUTH 1.1 % GEL dental gel Place 1 application onto teeth as needed (as needed for dry mouth).      No facility-administered medications prior to visit.     PAST MEDICAL HISTORY: Past Medical History:  Diagnosis Date  . A-fib ([place])   . Asthma   . Cerebral vasculitis   . COPD (chronic obstructive pulmonary disease) ([place])   . Fibromyalgia   . Gastric polyp   . GERD (gastroesophageal reflux disease)   . Hiatal hernia   . Hyperparathyroidism ([place])   . IBS (irritable bowel syndrome)   . MVP (mitral valve prolapse)   . Osteoarthritis   . Ovarian cancer ([place])  lymph node removal with hysterectomy  . Raynaud's disease   . RLS (restless legs syndrome)   . Situational depression   . Sjogren's syndrome ([place])   . Vasculitis ([place])   . Vitamin D deficiency     PAST SURGICAL HISTORY: Past Surgical History:  Procedure Laterality Date  . ABDOMINAL HYSTERECTOMY  1982   with right oophorectomy  . APPENDECTOMY    . BREAST BIOPSY Right    x 2  . BREAST SURGERY     Biopsy  . CARPAL TUNNEL RELEASE Right 1980  . CARPAL TUNNEL RELEASE Left 2010   x 2  . CATARACT EXTRACTION Bilateral   . CESAREAN SECTION     x 3  . KNEE SURGERY Left 2005  . OOPHORECTOMY Left 1962  . PACEMAKER INSERTION  09/15/2014  . REFRACTIVE SURGERY Bilateral 2014  . TUBAL LIGATION      FAMILY HISTORY: Family History  Problem Relation Age of Onset  . COPD Mother   . Breast cancer Mother   . Allergic rhinitis Mother   . Heart disease Father        No details  . Kidney disease Father   . Allergic rhinitis Father   . Stroke Sister   . Arthritis/Rheumatoid Sister   . Asthma Sister   . Lupus  Sister   . Heart attack Sister   . Stroke Paternal Grandmother   . Scleroderma Grandchild   . Thyroid disease Other   . Breast cancer Maternal Aunt        x 2    SOCIAL HISTORY:  Social History   Socioeconomic History  . Marital status: Single    Spouse name: Not on file  . Number of children: Not on file  . Years of education: Not on file  . Highest education level: Not on file  Occupational History  . Not on file  Social Needs  . Financial resource strain: Not on file  . Food insecurity:    Worry: Not on file    Inability: Not on file  . Transportation needs:    Medical: Not on file    Non-medical: Not on file  Tobacco Use  . Smoking status: Never Smoker  . Smokeless tobacco: Never Used  Substance and Sexual Activity  . Alcohol use: No  . Drug use: No  . Sexual activity: Not on file  Lifestyle  . Physical activity:    Days per week: Not on file    Minutes per session: Not on file  . Stress: Not on file  Relationships  . Social connections:    Talks on phone: Not on file    Gets together: Not on file    Attends religious service: Not on file    Active member of club or organization: Not on file    Attends meetings of clubs or organizations: Not on file    Relationship status: Not on file  . Intimate partner violence:    Fear of current or ex partner: Not on file    Emotionally abused: Not on file    Physically abused: Not on file    Forced sexual activity: Not on file  Other Topics Concern  . Not on file  Social History Narrative   Lives alone.  Moved from [place].     Caffeine- 6 cups daily, mix of caffeine/decaf   Children- 3   Retired Therapist, sports     PHYSICAL EXAM  GENERAL EXAM/CONSTITUTIONAL: Vitals:  Vitals:   04/23/17 0950  BP:  135/81  Pulse: 73  Weight: 139 lb 12.8 oz (63.4 kg)  Height: 5\' 4"  (1.626 m)     Body mass index is 24 kg/m.  No exam data present  Patient is in no distress; well developed, nourished and groomed; neck is  supple  CARDIOVASCULAR:  Examination of carotid arteries is normal; no carotid bruits  Regular rate and rhythm, no murmurs  Examination of peripheral vascular system by observation and palpation is normal  EYES:  Ophthalmoscopic exam of optic discs and posterior segments is normal; no papilledema or hemorrhages  MUSCULOSKELETAL:  Gait, strength, tone, movements noted in Neurologic exam below  NEUROLOGIC: MENTAL STATUS:  No flowsheet data found.  awake, alert, oriented to person, place and time  recent and remote memory intact  normal attention and concentration  language fluent, comprehension intact, naming intact,   fund of knowledge appropriate  CRANIAL NERVE:   2nd - no papilledema on fundoscopic exam  2nd, 3rd, 4th, 6th - pupils equal and reactive to light, visual fields full to confrontation, extraocular muscles intact, no nystagmus  5th - facial sensation symmetric  7th - facial strength symmetric  8th - hearing intact  9th - palate elevates symmetrically, uvula midline  11th - shoulder shrug symmetric  12th - tongue protrusion midline  MOTOR:   normal bulk and tone, full strength in the BUE, BLE  SENSORY:   normal and symmetric to light touch, pinprick, temperature, vibration  COORDINATION:   finger-nose-finger, fine finger movements normal  REFLEXES:   deep tendon reflexes present and symmetric  GAIT/STATION:   STOOPED POSTURE, ANTALGIC GAIT; LIMPING ON LEFT LEG; USES SINGLE POINT CANE    DIAGNOSTIC DATA (LABS, IMAGING, TESTING) - I reviewed patient records, labs, notes, testing and imaging myself where available.  Lab Results  Component Value Date   WBC 12.5 (H) 07/01/2016   HGB 14.5 07/01/2016   HCT 43.7 07/01/2016   MCV 93 07/01/2016   PLT 214 07/01/2016      Component Value Date/Time   NA 140 07/01/2016 1122   K 3.6 07/01/2016 1122   CL 96 07/01/2016 1122   CO2 27 07/01/2016 1122   GLUCOSE 99 07/01/2016 1122    GLUCOSE 75 01/24/2014 1135   BUN 20 07/01/2016 1122   CREATININE 0.85 07/01/2016 1122   CREATININE 0.74 01/24/2014 1135   CALCIUM 9.4 07/11/2016 1227   PROT 5.9 (L) 07/01/2016 1122   ALBUMIN 4.1 07/01/2016 1122   AST 13 07/01/2016 1122   ALT 15 07/01/2016 1122   ALKPHOS 84 07/01/2016 1122   BILITOT 0.7 07/01/2016 1122   GFRNONAA 67 07/01/2016 1122   GFRAA 77 07/01/2016 1122   Lab Results  Component Value Date   CHOL 212 (H) 04/18/2016   HDL 81 04/18/2016   LDLCALC 108 (H) 04/18/2016   TRIG 113 04/18/2016   CHOLHDL 2.6 04/18/2016   No results found for: HGBA1C Lab Results  Component Value Date   VITAMINB12 1,292 (H) 07/01/2016   Lab Results  Component Value Date   TSH 1.840 07/01/2016        ASSESSMENT AND PLAN  77 y.o. year old female here with constellation of symptoms, likely related to underlying issues of migraine, Sjogren's, osteoporosis, anxiety and fibromyalgia.  No definite evidence of CNS vasculitis based on current or prior symptoms as well as prior neurology note from 2012.  Ddx: migraine with aura, sjogren's syndrome, osteoarthritis, anxiety, fibromyalgia; unlikely to be CNS vasculitis (based on current exam and symptoms and prior neurology  note from 2012)  1. Migraine with aura and without status migrainosus, not intractable   2. Fibromyalgia   3. Anxiety   4. Sjogren's syndrome, with unspecified organ involvement ([place])     PLAN:  - ok to follow up MRI brain w/wo (ordered by Dr. Kathlene November); however likely to be low yield - continue physical therapy - consider psychology / counselor  Return if symptoms worsen or fail to improve, for return to PCP and rheumatology.    Penni Bombard, MD 3/84/5364, 68:03 AM Certified in Neurology, Neurophysiology and Neuroimaging  [facility] [address] [place], [place] [address] [phone]

## 2017-04-23 NOTE — Patient Instructions (Addendum)
-   follow up MRI brain w/wo  - consider physical therapy  - consider psychology / counselor

## 2017-04-23 NOTE — Telephone Encounter (Signed)
Please send in to new pharmacy.

## 2017-04-23 NOTE — Telephone Encounter (Signed)
RX sent to [facility]

## 2017-04-25 ENCOUNTER — Ambulatory Visit: Payer: Medicare Other | Admitting: Pharmacist Clinician (PhC)/ Clinical Pharmacy Specialist

## 2017-04-25 DIAGNOSIS — I482 Chronic atrial fibrillation: Secondary | ICD-10-CM | POA: Diagnosis not present

## 2017-04-25 DIAGNOSIS — I4821 Permanent atrial fibrillation: Secondary | ICD-10-CM

## 2017-04-25 LAB — COAGUCHEK XS/INR WAIVED
INR: 2.3 — ABNORMAL HIGH (ref 0.9–1.1)
PROTHROMBIN TIME: 27.4 s

## 2017-04-25 NOTE — Patient Instructions (Signed)
Description   Continue warfarin dose of 4mg  on Mondays, Wednesdays and Fridays. Take 2mg  all other days.  INR was 2.3 today (goal is 2.0 to 3.0)  Perfect reading today

## 2017-04-28 ENCOUNTER — Telehealth: Payer: Self-pay | Admitting: Family

## 2017-04-28 NOTE — Telephone Encounter (Signed)
Why is patient taking a Zpak and Clindamycin? Colchicine is to take as needed for gout attacks.

## 2017-04-28 NOTE — Telephone Encounter (Signed)
Patient states she is not on them at the time but they are the only abx that she can take. Her Rheumatologist is wanting to put her on colchicine for gout.  Patient read about medication and it states that there are some abx that can not take with medication due to them having a bad side effect. Just wanting to make sure in the future if she needs a zpac or clindamycin there won't be a problem.

## 2017-04-28 NOTE — Telephone Encounter (Signed)
Patient is wanting to know if Colchicine will effect her zpack and clindamycin. Please advise

## 2017-04-29 NOTE — Telephone Encounter (Signed)
There is no reaction between Colchicine and clindamycin, however there is a possible one with colchicine and zpak. This may  Not be a an issue if patient is taking colchicine as needed and not daily.

## 2017-04-29 NOTE — Telephone Encounter (Signed)
Pt aware.

## 2017-05-08 DIAGNOSIS — M7062 Trochanteric bursitis, left hip: Secondary | ICD-10-CM | POA: Diagnosis not present

## 2017-05-08 DIAGNOSIS — M1712 Unilateral primary osteoarthritis, left knee: Secondary | ICD-10-CM | POA: Diagnosis not present

## 2017-05-12 DIAGNOSIS — Z45018 Encounter for adjustment and management of other part of cardiac pacemaker: Secondary | ICD-10-CM | POA: Diagnosis not present

## 2017-05-12 DIAGNOSIS — Z45010 Encounter for checking and testing of cardiac pacemaker pulse generator [battery]: Secondary | ICD-10-CM | POA: Diagnosis not present

## 2017-05-13 ENCOUNTER — Other Ambulatory Visit: Payer: Self-pay

## 2017-05-13 MED ORDER — BUDESONIDE-FORMOTEROL FUMARATE 160-4.5 MCG/ACT IN AERO: 2.0000 | INHALATION_SPRAY | Freq: Two times a day (BID) | RESPIRATORY_TRACT | 3 refills | Status: DC

## 2017-05-20 ENCOUNTER — Encounter: Payer: Self-pay | Admitting: Physician Assistant

## 2017-05-20 ENCOUNTER — Ambulatory Visit (INDEPENDENT_AMBULATORY_CARE_PROVIDER_SITE_OTHER): Payer: Medicare Other | Admitting: Physician Assistant

## 2017-05-20 VITALS — BP 125/73 | HR 61 | Temp 97.7°F | Ht 64.0 in | Wt 142.0 lb

## 2017-05-20 DIAGNOSIS — T7840XA Allergy, unspecified, initial encounter: Secondary | ICD-10-CM

## 2017-05-20 DIAGNOSIS — W57XXXA Bitten or stung by nonvenomous insect and other nonvenomous arthropods, initial encounter: Secondary | ICD-10-CM | POA: Diagnosis not present

## 2017-05-20 MED ORDER — METHYLPREDNISOLONE ACETATE 40 MG/ML IJ SUSP: 40.0000 mg | Freq: Once | INTRAMUSCULAR | Status: DC

## 2017-05-20 MED ORDER — METHYLPREDNISOLONE ACETATE 80 MG/ML IJ SUSP
40.0000 mg | Freq: Once | INTRAMUSCULAR | Status: AC
  Administered 2017-05-20: 40 mg via INTRAMUSCULAR

## 2017-05-20 NOTE — Patient Instructions (Signed)
In a few days you may receive a survey in the mail or online from [employer] regarding your visit with us today. Please take a moment to fill this out. Your feedback is very important to our whole office. It can help us better understand your needs as well as improve your experience and satisfaction. Thank you for taking your time to complete it. We care about you.  Nusaiba Guallpa, PA-C  

## 2017-05-20 NOTE — Progress Notes (Signed)
BP 125/73   Pulse 61   Temp 97.7 F (36.5 C) (Oral)   Ht 5\' 4"  (1.626 m)   Wt 142 lb (64.4 kg)   BMI 24.37 kg/m    Subjective:    Patient ID: Angela Wong, female    DOB: 1940/11/16, 77 y.o.   MRN: [id number]  HPI: Angela Wong is a 77 y.o. female presenting on 05/20/2017 for Belepharitis (right )  Patient comes in with swelling above the right eye.  She came in last night after working in her yard she had small amount of swelling at the outer corner of the right eye. Upon rising this morning she had much more swelling and slight blocking of vision from the swollen upper lid. Already on prednisone 5 mg daily and Allegra.  Past Medical History:  Diagnosis Date  . A-fib ([place])   . Asthma   . Cerebral vasculitis   . COPD (chronic obstructive pulmonary disease) ([place])   . Fibromyalgia   . Gastric polyp   . GERD (gastroesophageal reflux disease)   . Hiatal hernia   . Hyperparathyroidism ([place])   . IBS (irritable bowel syndrome)   . MVP (mitral valve prolapse)   . Osteoarthritis   . Ovarian cancer ([place])    lymph node removal with hysterectomy  . Raynaud's disease   . RLS (restless legs syndrome)   . Situational depression   . Sjogren's syndrome ([place])   . Vasculitis ([place])   . Vitamin D deficiency    Relevant past medical, surgical, family and social history reviewed and updated as indicated. Interim medical history since our last visit reviewed. Allergies and medications reviewed and updated. DATA REVIEWED: CHART IN EPIC  Family History reviewed for pertinent findings.  Review of Systems  Constitutional: Negative.   HENT: Negative.   Eyes: Positive for itching and visual disturbance. Negative for discharge and redness.  Respiratory: Negative.   Gastrointestinal: Negative.   Genitourinary: Negative.     Allergies as of 05/20/2017      Reactions   Cephalosporins Hives, Shortness Of Breath   Ciprofloxacin Hives, Shortness Of Breath   Doxycycline Hives,  Shortness Of Breath   Horse-derived Products Anaphylaxis   Ketek [telithromycin] Palpitations   Chest discomfort   Nitrofuran Derivatives Anaphylaxis, Hives   blisters   Nitrous Oxide Nausea And Vomiting   Severe due to Sjogrens (Auto-Immune Disease)   Other Anaphylaxis   ALLERGY TO HORSE SERUM   Penicillins Hives, Shortness Of Breath   Sulfa Antibiotics Hives, Shortness Of Breath   Talwin [pentazocine] Other (See Comments)   Other reaction(s): Mental Status Changes (intolerance) Altered Mental Status  Altered Mental Status    Trovan [alatrofloxacin] Palpitations, Other (See Comments), Anaphylaxis   Chest pain, dizziness, irregular pulse   Amlodipine Swelling   Calcium Channel Blockers    Respiratory distress   Carvedilol Other (See Comments)   Dizziness, "joint pain, depression"   Codeine Nausea Only   Cymbalta [duloxetine Hcl] Swelling   Diltiazem    Swollen throat   Diovan [valsartan] Swelling   Lisinopril Swelling   Tramadol Nausea Only   Zoloft [sertraline Hcl]    Loteprednol Etabonate Rash      Medication List        Accurate as of 05/20/17  1:24 PM. Always use your most recent med list.          albuterol 108 (90 Base) MCG/ACT inhaler Commonly known as:  PROVENTIL HFA;VENTOLIN HFA Inhale 2 puffs into the  lungs every 6 (six) hours as needed for wheezing.   atenolol 25 MG tablet Commonly known as:  TENORMIN Take 12.5 mg by mouth daily.   azelastine 0.1 % nasal spray Commonly known as:  ASTELIN Place 2 sprays into both nostrils 2 (two) times daily.   B-complex with vitamin C tablet Take 1 tablet by mouth daily.   budesonide-formoterol 160-4.5 MCG/ACT inhaler Commonly known as:  SYMBICORT Inhale 2 puffs into the lungs 2 (two) times daily.   bumetanide 0.5 MG tablet Commonly known as:  BUMEX Take 1 tablet (0.5 mg total) by mouth every other day.   bumetanide 1 MG tablet Commonly known as:  BUMEX Take 1 tablet (1 mg total) by mouth every other day. On  opposite day of 0.5mg  of bumetadnide   cycloSPORINE 0.05 % ophthalmic emulsion Commonly known as:  RESTASIS Place 1 drop into both eyes 2 (two) times daily.   docusate sodium 100 MG capsule Commonly known as:  COLACE Take 400 mg by mouth daily.   esomeprazole 40 MG capsule Commonly known as:  NEXIUM Take 1 capsule (40 mg total) by mouth 2 (two) times daily before a meal.   fexofenadine 180 MG tablet Commonly known as:  ALLEGRA Take 180 mg by mouth every morning.   GAS-X EXTRA STRENGTH 125 MG Caps Generic drug:  Simethicone Take 2 capsules by mouth daily as needed (for relief).   Iron Polysacch Cmplx-B12-FA 150-0.025-1 MG Caps Commonly known as:  POLY-IRON 150 FORTE Take 1 tablet by mouth daily.   levothyroxine 100 MCG tablet Commonly known as:  SYNTHROID, LEVOTHROID TAKE 1 TABLET BY MOUTH EVERY DAY   losartan 100 MG tablet Commonly known as:  COZAAR Take 1 tablet (100 mg total) by mouth daily.   Magnesium 200 MG Tabs Take 1 tablet by mouth daily.   metroNIDAZOLE 1 % gel Commonly known as:  METROGEL Apply topically daily.   montelukast 10 MG tablet Commonly known as:  SINGULAIR Take 1 tablet (10 mg total) by mouth at bedtime.   Olopatadine HCl 0.2 % Soln Apply 1 drop to eye daily.   predniSONE 5 MG tablet Commonly known as:  DELTASONE Take 1 tablet (5 mg total) by mouth daily.   PREVIDENT 5000 DRY MOUTH 1.1 % Gel dental gel Generic drug:  sodium fluoride Place 1 application onto teeth as needed (as needed for dry mouth).   ranitidine 150 MG tablet Commonly known as:  ZANTAC One at bedtime   traZODone 150 MG tablet Commonly known as:  DESYREL Take 1 tablet (150 mg total) by mouth at bedtime.   VITAMIN B 12 PO Take 500 mg by mouth daily.   Vitamin D 2000 units Caps Take 2 capsules by mouth every evening.   warfarin 1 MG tablet Commonly known as:  COUMADIN Take as directed by the anticoagulation clinic. If you are unsure how to take this medication,  talk to your nurse or doctor. Original instructions:  TAKE 2 TO 4 TABLETS (= 2 TO 4MG ) BY MOUTH ONCE DAILY AS DIRECTED BY ANTICOGULATION CLINIC.          Objective:    BP 125/73   Pulse 61   Temp 97.7 F (36.5 C) (Oral)   Ht 5\' 4"  (1.626 m)   Wt 142 lb (64.4 kg)   BMI 24.37 kg/m   Allergies  Allergen Reactions  . Cephalosporins Hives and Shortness Of Breath  . Ciprofloxacin Hives and Shortness Of Breath  . Doxycycline Hives and Shortness Of Breath  .  Horse-Derived Products Anaphylaxis  . Ketek [Telithromycin] Palpitations    Chest discomfort  . Nitrofuran Derivatives Anaphylaxis and Hives    blisters  . Nitrous Oxide Nausea And Vomiting    Severe due to Sjogrens (Auto-Immune Disease)  . Other Anaphylaxis    ALLERGY TO HORSE SERUM  . Penicillins Hives and Shortness Of Breath  . Sulfa Antibiotics Hives and Shortness Of Breath  . Talwin [Pentazocine] Other (See Comments)    Other reaction(s): Mental Status Changes (intolerance) Altered Mental Status  Altered Mental Status   . Trovan [Alatrofloxacin] Palpitations, Other (See Comments) and Anaphylaxis    Chest pain, dizziness, irregular pulse  . Amlodipine Swelling  . Calcium Channel Blockers     Respiratory distress  . Carvedilol Other (See Comments)    Dizziness, "joint pain, depression"  . Codeine Nausea Only  . Cymbalta [Duloxetine Hcl] Swelling  . Diltiazem     Swollen throat   . Diovan [Valsartan] Swelling  . Lisinopril Swelling  . Tramadol Nausea Only  . Zoloft [Sertraline Hcl]   . Loteprednol Etabonate Rash    Wt Readings from Last 3 Encounters:  05/20/17 142 lb (64.4 kg)  04/23/17 139 lb 12.8 oz (63.4 kg)  04/04/17 142 lb (64.4 kg)    Physical Exam  Constitutional: She is oriented to person, place, and time. She appears well-developed and well-nourished.  HENT:  Head: Normocephalic and atraumatic.  Eyes: Pupils are equal, round, and reactive to light. Conjunctivae and EOM are normal. Right eye  exhibits no chemosis and no discharge.    Bite on right upper lid with surrounding erythema there is slight lowering of the upper lid due to the swelling  Cardiovascular: Normal rate, regular rhythm, normal heart sounds and intact distal pulses.  Pulmonary/Chest: Effort normal and breath sounds normal.  Abdominal: Soft. Bowel sounds are normal.  Neurological: She is alert and oriented to person, place, and time. She has normal reflexes.  Skin: Skin is warm and dry. No rash noted.  Psychiatric: She has a normal mood and affect. Her behavior is normal. Judgment and thought content normal.    Results for orders placed or performed in visit on 04/25/17  CoaguChek XS/INR Waived  Result Value Ref Range   INR 2.3 (H) 0.9 - 1.1   Prothrombin Time 27.4 sec      Assessment & Plan:   1. Allergic reaction, initial encounter - methylPREDNISolone acetate (DEPO-MEDROL) injection 40 mg Benadryl 4 times daily for 1 to 2 days Encouraged ice to the area  2. Bug bite, initial encounter - methylPREDNISolone acetate (DEPO-MEDROL) injection 40 mg   Continue all other maintenance medications as listed above.  Follow up plan: No follow-ups on file.  Educational handout given for [place] PA-C [place] [address]  [place], [place] [address] [phone]   05/20/2017, 1:24 PM

## 2017-05-23 DIAGNOSIS — I6782 Cerebral ischemia: Secondary | ICD-10-CM | POA: Diagnosis not present

## 2017-05-23 DIAGNOSIS — Z8679 Personal history of other diseases of the circulatory system: Secondary | ICD-10-CM | POA: Diagnosis not present

## 2017-05-23 DIAGNOSIS — M948X9 Other specified disorders of cartilage, unspecified sites: Secondary | ICD-10-CM | POA: Diagnosis not present

## 2017-05-23 DIAGNOSIS — R51 Headache: Secondary | ICD-10-CM | POA: Diagnosis not present

## 2017-05-23 DIAGNOSIS — M47896 Other spondylosis, lumbar region: Secondary | ICD-10-CM | POA: Diagnosis not present

## 2017-05-23 DIAGNOSIS — Z8661 Personal history of infections of the central nervous system: Secondary | ICD-10-CM | POA: Diagnosis not present

## 2017-05-23 DIAGNOSIS — M7062 Trochanteric bursitis, left hip: Secondary | ICD-10-CM | POA: Diagnosis not present

## 2017-05-23 DIAGNOSIS — M25452 Effusion, left hip: Secondary | ICD-10-CM | POA: Diagnosis not present

## 2017-06-06 ENCOUNTER — Ambulatory Visit (INDEPENDENT_AMBULATORY_CARE_PROVIDER_SITE_OTHER): Payer: Medicare Other | Admitting: Pharmacist Clinician (PhC)/ Clinical Pharmacy Specialist

## 2017-06-06 DIAGNOSIS — I482 Chronic atrial fibrillation: Secondary | ICD-10-CM

## 2017-06-06 DIAGNOSIS — I4821 Permanent atrial fibrillation: Secondary | ICD-10-CM

## 2017-06-06 LAB — COAGUCHEK XS/INR WAIVED
INR: 2.4 — ABNORMAL HIGH (ref 0.9–1.1)
Prothrombin Time: 28.9 s

## 2017-06-06 NOTE — Patient Instructions (Signed)
Description   Continue warfarin dose of 4mg  on Mondays, Wednesdays and Fridays. Take 2mg  all other days.  INR was 2.4 today (goal is 2.0 to 3.0)  Perfect reading today

## 2017-06-07 ENCOUNTER — Other Ambulatory Visit: Payer: Self-pay | Admitting: Internal Medicine

## 2017-06-07 ENCOUNTER — Other Ambulatory Visit: Payer: Self-pay | Admitting: Nurse Practitioner

## 2017-06-07 ENCOUNTER — Other Ambulatory Visit: Payer: Self-pay | Admitting: Family

## 2017-06-07 DIAGNOSIS — J45909 Unspecified asthma, uncomplicated: Secondary | ICD-10-CM

## 2017-06-10 NOTE — Telephone Encounter (Signed)
Last seen 5/19 

## 2017-06-11 ENCOUNTER — Other Ambulatory Visit: Payer: Self-pay | Admitting: Internal Medicine

## 2017-06-13 ENCOUNTER — Telehealth: Payer: Self-pay | Admitting: Internal Medicine

## 2017-06-13 NOTE — Telephone Encounter (Signed)
Dr Hilarie Fredrickson, your last office note indicates that patient is to take Nexium twice daily dosing (which she was doing well on). However, patient states that she has been having breakthrough reflux at bedtime on twice daily Nexium so she has been needing to take ranitidine 150 mg at bedtime as well as some gas-x occasionally. She states that she is following reflux precautions/diet, has hob elevated. She does eat "lots of fruits and vegetables" so she attributes some of her symptoms to this.   Patient has tried omeprazole in the past with ineffective results. She does not recall names of other meds that I have called off to her although she indicates she has "tried them all before." She also notes that she is "allergic to so many medications" that she prefers not to have to change medications since Nexium does okay for her.  Can she continue Nexium BID and add ranitidine back in or does she need to change to another PPI in hopes it would give her more effective results?

## 2017-06-15 NOTE — Telephone Encounter (Signed)
Thanks for the thorough message I would continue Nexium 40 mg BID-AC and add back ranitidine 150 mg each evening or at bedtime

## 2017-06-16 MED ORDER — RANITIDINE HCL 150 MG PO TABS: 150.0000 mg | ORAL_TABLET | Freq: Every day | ORAL | 1 refills | Status: DC

## 2017-06-16 NOTE — Telephone Encounter (Signed)
Patient has been advised that Dr Hilarie Fredrickson is okay with her adding back in ranitidine 150 mg every evening or at bedtime. She verbalizes understanding. Rx sent.

## 2017-06-17 ENCOUNTER — Ambulatory Visit (INDEPENDENT_AMBULATORY_CARE_PROVIDER_SITE_OTHER): Payer: Medicare Other | Admitting: Physician Assistant

## 2017-06-17 ENCOUNTER — Encounter: Payer: Self-pay | Admitting: Physician Assistant

## 2017-06-17 VITALS — BP 136/74 | HR 60 | Temp 97.6°F | Ht 64.0 in | Wt 138.2 lb

## 2017-06-17 DIAGNOSIS — S00261A Insect bite (nonvenomous) of right eyelid and periocular area, initial encounter: Secondary | ICD-10-CM

## 2017-06-17 DIAGNOSIS — W57XXXA Bitten or stung by nonvenomous insect and other nonvenomous arthropods, initial encounter: Secondary | ICD-10-CM | POA: Diagnosis not present

## 2017-06-17 MED ORDER — METHYLPREDNISOLONE ACETATE 80 MG/ML IJ SUSP
80.0000 mg | Freq: Once | INTRAMUSCULAR | Status: AC
  Administered 2017-06-17: 80 mg via INTRAMUSCULAR

## 2017-06-17 NOTE — Progress Notes (Signed)
BP 136/74   Pulse 60   Temp 97.6 F (36.4 C) (Oral)   Ht 5\' 4"  (1.626 m)   Wt 138 lb 3.2 oz (62.7 kg)   BMI 23.72 kg/m    Subjective:    Patient ID: Angela Wong, female    DOB: 1940/04/11, 77 y.o.   MRN: [id number]  HPI: Angela Wong is a 77 y.o. female presenting on 06/17/2017 for Conjunctivitis (right eye red and swollen ) and Insect Bite (sunday )  Has had a bite like this before on her eyelid. She got this one last night, uncertain what kind bit her.  Has swelling and itching.  She has taken Benadryl last evening and it seemed to help.  She is on multiple antihistamine medications and she continue those.  She denies any fever and chills at this time.  There are no visual deficits..  Past Medical History:  Diagnosis Date  . A-fib ([place])   . Asthma   . Cerebral vasculitis   . COPD (chronic obstructive pulmonary disease) ([place])   . Fibromyalgia   . Gastric polyp   . GERD (gastroesophageal reflux disease)   . Hiatal hernia   . Hyperparathyroidism ([place])   . IBS (irritable bowel syndrome)   . MVP (mitral valve prolapse)   . Osteoarthritis   . Ovarian cancer ([place])    lymph node removal with hysterectomy  . Raynaud's disease   . RLS (restless legs syndrome)   . Situational depression   . Sjogren's syndrome ([place])   . Vasculitis ([place])   . Vitamin D deficiency    Relevant past medical, surgical, family and social history reviewed and updated as indicated. Interim medical history since our last visit reviewed. Allergies and medications reviewed and updated. DATA REVIEWED: CHART IN EPIC  Family History reviewed for pertinent findings.  Review of Systems  Constitutional: Negative.  Negative for activity change, fatigue and fever.  HENT: Negative.   Eyes: Negative.   Respiratory: Negative.  Negative for cough.   Cardiovascular: Negative.  Negative for chest pain.  Gastrointestinal: Negative.  Negative for abdominal pain.  Endocrine: Negative.   Genitourinary:  Negative.  Negative for dysuria.  Musculoskeletal: Negative.   Skin: Positive for color change and wound.  Neurological: Negative.     Allergies as of 06/17/2017      Reactions   Cephalosporins Hives, Shortness Of Breath   Ciprofloxacin Hives, Shortness Of Breath   Doxycycline Hives, Shortness Of Breath   Horse-derived Products Anaphylaxis   Ketek [telithromycin] Palpitations   Chest discomfort   Nitrofuran Derivatives Anaphylaxis, Hives   blisters   Nitrous Oxide Nausea And Vomiting   Severe due to Sjogrens (Auto-Immune Disease)   Other Anaphylaxis   ALLERGY TO HORSE SERUM   Penicillins Hives, Shortness Of Breath   Sulfa Antibiotics Hives, Shortness Of Breath   Talwin [pentazocine] Other (See Comments)   Other reaction(s): Mental Status Changes (intolerance) Altered Mental Status  Altered Mental Status    Trovan [alatrofloxacin] Palpitations, Other (See Comments), Anaphylaxis   Chest pain, dizziness, irregular pulse   Amlodipine Swelling   Calcium Channel Blockers    Respiratory distress   Carvedilol Other (See Comments)   Dizziness, "joint pain, depression"   Codeine Nausea Only   Cymbalta [duloxetine Hcl] Swelling   Diltiazem    Swollen throat   Diovan [valsartan] Swelling   Lisinopril Swelling   Tramadol Nausea Only   Zoloft [sertraline Hcl]    Loteprednol Etabonate Rash  Medication List        Accurate as of 06/17/17  6:21 PM. Always use your most recent med list.          albuterol 108 (90 Base) MCG/ACT inhaler Commonly known as:  PROVENTIL HFA;VENTOLIN HFA Inhale 2 puffs into the lungs every 6 (six) hours as needed for wheezing.   atenolol 25 MG tablet Commonly known as:  TENORMIN Take 12.5 mg by mouth daily.   azelastine 0.1 % nasal spray Commonly known as:  ASTELIN Place 2 sprays into both nostrils 2 (two) times daily.   B-complex with vitamin C tablet Take 1 tablet by mouth daily.   budesonide-formoterol 160-4.5 MCG/ACT inhaler Commonly  known as:  SYMBICORT Inhale 2 puffs into the lungs 2 (two) times daily.   bumetanide 0.5 MG tablet Commonly known as:  BUMEX Take 1 tablet (0.5 mg total) by mouth every other day.   bumetanide 1 MG tablet Commonly known as:  BUMEX Take 1 tablet (1 mg total) by mouth every other day. On opposite day of 0.5mg  of bumetadnide   cycloSPORINE 0.05 % ophthalmic emulsion Commonly known as:  RESTASIS Place 1 drop into both eyes 2 (two) times daily.   docusate sodium 100 MG capsule Commonly known as:  COLACE Take 400 mg by mouth daily.   esomeprazole 40 MG capsule Commonly known as:  NEXIUM Take 1 capsule (40 mg total) by mouth 2 (two) times daily before a meal.   fexofenadine 180 MG tablet Commonly known as:  ALLEGRA Take 180 mg by mouth every morning.   GAS-X EXTRA STRENGTH 125 MG Caps Generic drug:  Simethicone Take 2 capsules by mouth daily as needed (for relief).   Iron Polysacch Cmplx-B12-FA 150-0.025-1 MG Caps Commonly known as:  POLY-IRON 150 FORTE Take 1 tablet by mouth daily.   levothyroxine 100 MCG tablet Commonly known as:  SYNTHROID, LEVOTHROID TAKE 1 TABLET BY MOUTH EVERY DAY   losartan 100 MG tablet Commonly known as:  COZAAR Take 1 tablet (100 mg total) by mouth daily.   Magnesium 200 MG Tabs Take 1 tablet by mouth daily.   metroNIDAZOLE 1 % gel Commonly known as:  METROGEL Apply topically daily.   montelukast 10 MG tablet Commonly known as:  SINGULAIR TAKE 1 TABLET BY MOUTH ONCE DAILY AT BEDTIME   Olopatadine HCl 0.2 % Soln Apply 1 drop to eye daily.   predniSONE 5 MG tablet Commonly known as:  DELTASONE TAKE 1 TABLET BY MOUTH ONCE DAILY   PREVIDENT 5000 DRY MOUTH 1.1 % Gel dental gel Generic drug:  sodium fluoride Place 1 application onto teeth as needed (as needed for dry mouth).   ranitidine 150 MG tablet Commonly known as:  ZANTAC Take 1 tablet (150 mg total) by mouth at bedtime.   traZODone 150 MG tablet Commonly known as:   DESYREL Take 1 tablet (150 mg total) by mouth at bedtime.   VITAMIN B 12 PO Take 500 mg by mouth daily.   Vitamin D 2000 units Caps Take 2 capsules by mouth every evening.   warfarin 1 MG tablet Commonly known as:  COUMADIN Take as directed by the anticoagulation clinic. If you are unsure how to take this medication, talk to your nurse or doctor. Original instructions:  TAKE 2 TO 4 TABLETS (= 2 TO 4MG ) BY MOUTH ONCE DAILY AS DIRECTED BY ANTICOGULATION CLINIC.          Objective:    BP 136/74   Pulse 60   Temp  97.6 F (36.4 C) (Oral)   Ht 5\' 4"  (1.626 m)   Wt 138 lb 3.2 oz (62.7 kg)   BMI 23.72 kg/m   Allergies  Allergen Reactions  . Cephalosporins Hives and Shortness Of Breath  . Ciprofloxacin Hives and Shortness Of Breath  . Doxycycline Hives and Shortness Of Breath  . Horse-Derived Products Anaphylaxis  . Ketek [Telithromycin] Palpitations    Chest discomfort  . Nitrofuran Derivatives Anaphylaxis and Hives    blisters  . Nitrous Oxide Nausea And Vomiting    Severe due to Sjogrens (Auto-Immune Disease)  . Other Anaphylaxis    ALLERGY TO HORSE SERUM  . Penicillins Hives and Shortness Of Breath  . Sulfa Antibiotics Hives and Shortness Of Breath  . Talwin [Pentazocine] Other (See Comments)    Other reaction(s): Mental Status Changes (intolerance) Altered Mental Status  Altered Mental Status   . Trovan [Alatrofloxacin] Palpitations, Other (See Comments) and Anaphylaxis    Chest pain, dizziness, irregular pulse  . Amlodipine Swelling  . Calcium Channel Blockers     Respiratory distress  . Carvedilol Other (See Comments)    Dizziness, "joint pain, depression"  . Codeine Nausea Only  . Cymbalta [Duloxetine Hcl] Swelling  . Diltiazem     Swollen throat   . Diovan [Valsartan] Swelling  . Lisinopril Swelling  . Tramadol Nausea Only  . Zoloft [Sertraline Hcl]   . Loteprednol Etabonate Rash    Wt Readings from Last 3 Encounters:  06/17/17 138 lb 3.2 oz (62.7  kg)  05/20/17 142 lb (64.4 kg)  04/23/17 139 lb 12.8 oz (63.4 kg)    Physical Exam  Constitutional: She is oriented to person, place, and time. She appears well-developed and well-nourished.  HENT:  Head: Normocephalic and atraumatic.  Eyes: Pupils are equal, round, and reactive to light. Conjunctivae and EOM are normal. Right eye exhibits no discharge and no exudate. Left eye exhibits no discharge and no exudate. Right conjunctiva is not injected. Right conjunctiva has no hemorrhage. Left conjunctiva is not injected. Left conjunctiva has no hemorrhage.    Swelling on upper lid and medial portion  Cardiovascular: Normal rate, regular rhythm, normal heart sounds and intact distal pulses.  Pulmonary/Chest: Effort normal and breath sounds normal.  Abdominal: Soft. Bowel sounds are normal.  Neurological: She is alert and oriented to person, place, and time. She has normal reflexes.  Skin: Skin is warm and dry. No rash noted.  Psychiatric: She has a normal mood and affect. Her behavior is normal. Judgment and thought content normal.        Assessment & Plan:   1. Insect bite of right eyelid, initial encounter - methylPREDNISolone acetate (DEPO-MEDROL) injection 80 mg   Continue all other maintenance medications as listed above.  Follow up plan: No follow-ups on file.  Educational handout given for [place] PA-C [place] [address]  [place], [place] [address] [phone]   06/17/2017, 6:21 PM

## 2017-07-01 ENCOUNTER — Ambulatory Visit (INDEPENDENT_AMBULATORY_CARE_PROVIDER_SITE_OTHER): Payer: Medicare Other | Admitting: Allergy & Immunology

## 2017-07-01 ENCOUNTER — Encounter: Payer: Self-pay | Admitting: Allergy & Immunology

## 2017-07-01 ENCOUNTER — Other Ambulatory Visit: Payer: Self-pay | Admitting: Allergy & Immunology

## 2017-07-01 VITALS — BP 128/78 | HR 65 | Resp 17

## 2017-07-01 DIAGNOSIS — L299 Pruritus, unspecified: Secondary | ICD-10-CM

## 2017-07-01 DIAGNOSIS — J4540 Moderate persistent asthma, uncomplicated: Secondary | ICD-10-CM

## 2017-07-01 NOTE — Patient Instructions (Addendum)
1. Seasonal and perennial allergic rhinitis - Restart azelastine one spray per nostril once daily (can increase up to two sprays twice daily). - Continue with nasal saline rinses as tolerated.   2. Moderate persistent asthma, uncomplicated - Lung function looked fairly good today. - We will not make any changes at this time. - Daily controller medication(s): Singulair 10mg  daily and Symbicort 160/4.82mcg two puffs twice daily with spacer - Prior to physical activity: ProAir 2 puffs 10-15 minutes before physical activity. - Rescue medications: ProAir 4 puffs every 4-6 hours as needed - Asthma control goals:  * Full participation in all desired activities (may need albuterol before activity) * Albuterol use two time or less a week on average (not counting use with activity) * Cough interfering with sleep two time or less a month * Oral steroids no more than once a year * No hospitalizations  3. Sjogren's syndrome with costochondritis - Continue with follow with Rheumatology.   4. Itching - I will get some labs to see what might be causing your itching.  - In the meantime, start cetirizine 10mg  at night to see if this can help.  - We will get some blood work to look for stinging insect allergies as well as additional environmental allergens. - We will call you in 1-2 weeks with the results of the testing.   4. Return in about 6 months (around 12/31/2017).   Please inform us of any Emergency Department visits, hospitalizations, or changes in symptoms. Call us before going to the ED for breathing or allergy symptoms since we might be able to fit you in for a sick visit. Feel free to contact us anytime with any questions, problems, or concerns.  It was a pleasure to see you again today!  Websites that have reliable patient information: 1. American Academy of Asthma, Allergy, and Immunology: www.aaaai.org 2. Food Allergy Research and Education (FARE): foodallergy.org 3. Mothers of  Asthmatics: http://www.asthmacommunitynetwork.org 4. American College of Allergy, Asthma, and Immunology: MonthlyElectricBill.co.uk   Make sure you are registered to vote!

## 2017-07-01 NOTE — Progress Notes (Signed)
FOLLOW UP  Date of Service/Encounter:  07/01/17   Assessment:   Moderate persistent asthma without complication  Seasonal and perennial allergic rhinitis  Itching - unknown trigger   Asthma Reportables:  Severity: moderate persistent  Risk: high Control: not well controlled   Plan/Recommendations:   1. Seasonal and perennial allergic rhinitis - Restart azelastine one spray per nostril once daily (can increase up to two sprays twice daily). - Continue with nasal saline rinses as tolerated.   2. Moderate persistent asthma, uncomplicated - Lung function looked fairly good today. - We will not make any changes at this time. - Daily controller medication(s): Singulair 10mg  daily and Symbicort 160/4.64mcg two puffs twice daily with spacer - Prior to physical activity: ProAir 2 puffs 10-15 minutes before physical activity. - Rescue medications: ProAir 4 puffs every 4-6 hours as needed - Asthma control goals:  * Full participation in all desired activities (may need albuterol before activity) * Albuterol use two time or less a week on average (not counting use with activity) * Cough interfering with sleep two time or less a month * Oral steroids no more than once a year * No hospitalizations  3. Sjogren's syndrome with costochondritis - Continue with follow with Rheumatology.   4. Itching - I will get some labs to see what might be causing your itching.  - In the meantime, start cetirizine 10mg  at night to see if this can help.  - We will get some blood work to look for stinging insect allergies as well as additional environmental allergens. - We will call you in 1-2 weeks with the results of the testing.   4. Return in about 6 months (around 12/31/2017).  Subjective:   Angela Wong is a 77 y.o. female presenting today for follow up of  Chief Complaint  Patient presents with  . Allergic Rhinitis   . Pruritus  . Urticaria    Angela Wong has a history of  the following: Patient Active Problem List   Diagnosis Date Noted  . Seasonal and perennial allergic rhinitis 12/31/2016  . Allergic rhinitis with a nonallergic component 10/01/2016  . Pruritus 10/01/2016  . 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 Steroid-induced osteopenia 07/11/2016  . Hyperparathyroidism ([place]) 01/16/2016  . Iron deficiency anemia 12/18/2015  . Insomnia 12/18/2015  . Hypothyroidism 12/18/2015  . Rotator cuff tear 03/23/2014  . Osteoarthritis   . Moderate persistent asthma   . Ovarian cancer ([place])   . Fibromyalgia   . RLS (restless legs syndrome)   . Cerebral vasculitis   . Sjogren's syndrome ([place])   . Raynaud's disease   . MVP (mitral valve prolapse)   . IBS (irritable bowel syndrome)   . Congestive dilated cardiomyopathy ([place]) 02/09/2014  . Dyspnea 12/13/2013  . Hyperlipidemia 11/26/2013  . Malaise and fatigue 11/26/2013  . Multiple drug allergies 11/02/2013  . History of ovarian cancer 10/01/2013  . GERD (gastroesophageal reflux disease) 10/01/2013  . Benign essential HTN 10/01/2013  . Atrial fibrillation, permanent ([place]) 07/27/2013    History obtained from: chart review and patient.  Angela Wong's Primary Care Provider is [name], FNP.     Angela Wong is a 77 y.o. female presenting for a follow up visit.   Since the last visit, she has mostly done well.   In the interim, she had a fall at home when she was trying to remove her clothing while standing up.  She fell onto her knees and her left knee is bone on  bone and her left hip is as well. She had put a rug on the floor and this saved her. She did almost hit her head on a table and then fell into laundry baskets under the table. This all occurred on Sunday night. She did not feel dizzy and felt mostly good. She is on coumadin so she was worried, but she took the phone ot nbed with her.   Breathing is not going well. The humidity has seemed ot have made it worse overall. She did have some problems  going to the mailbox. She does have night time coughing which does get better with her emergency inhaler. She has had marked postnasal drip and did not   She is under a lot of stress as well and thinks this might be contributing to her symptoms. She is having some house work done which is stressful due to the cost. She is having some problems with the tenants of her home, which she inherited from her father (the house is in [place] so she is trying to manage all of this from [place]).   She does report some itching over her entire body. She does gets areas of redness but no overt hives. She has been bitten by mosquitoes and a different flying insect near her eye, which resulted in some large localized swelling. She was treated with DepoMedrol on two occasions.   She sees Dr. Lahoma Rocker (Rheuatmology) for her Sjogren's syndrome, and her next appointment is this next week.   Otherwise, there have been no changes to her past medical history, surgical history, family history, or social history.    Review of Systems: a 14-point review of systems is pertinent for what is mentioned in HPI.  Otherwise, all other systems were negative. Constitutional: negative other than that listed in the HPI Eyes: negative other than that listed in the HPI Ears, nose, mouth, throat, and face: negative other than that listed in the HPI Respiratory: negative other than that listed in the HPI Cardiovascular: negative other than that listed in the HPI Gastrointestinal: negative other than that listed in the HPI Genitourinary: negative other than that listed in the HPI Integument: negative other than that listed in the HPI Hematologic: negative other than that listed in the HPI Musculoskeletal: negative other than that listed in the HPI Neurological: negative other than that listed in the HPI Allergy/Immunologic: negative other than that listed in the HPI    Objective:   Blood pressure 128/78, pulse 65, resp.  rate 17, SpO2 95 %. There is no height or weight on file to calculate BMI.   Physical Exam:  General: Alert, interactive, in no acute distress. Pleasant and talkative.  Eyes: No conjunctival injection bilaterally, no discharge on the right, no discharge on the left and no Horner-Trantas dots present. PERRL bilaterally. EOMI without pain. No photophobia.  Ears: Right TM pearly gray with normal light reflex, Left TM pearly gray with normal light reflex, Right TM intact without perforation and Left TM intact without perforation.  Nose/Throat: External nose within normal limits and nasal crease present. Turbinates edematous and pale with clear discharge. Posterior oropharynx erythematous without cobblestoning in the posterior oropharynx. Tonsils 2+ without exudates.  Tongue without thrush. Lungs: Clear to auscultation without wheezing, rhonchi or rales. No increased work of breathing. CV: Normal S1/S2. No murmurs. Capillary refill <2 seconds.  Skin: Warm and dry, without lesions or rashes  Neuro:   Grossly intact. No focal deficits appreciated. Responsive to questions.  Diagnostic studies:  Spirometry: results normal (FEV1: 1.60/87%, FVC: 2.11/79%, FEV1/FVC: 76%).    Spirometry consistent with possible restrictive disease.  Allergy Studies: none     Salvatore Marvel, MD  [facility]

## 2017-07-02 ENCOUNTER — Encounter: Payer: Self-pay | Admitting: Allergy & Immunology

## 2017-07-02 LAB — CBC/DIFF AMBIGUOUS DEFAULT
BASOS: 0 %
Basophils Absolute: 0 10*3/uL (ref 0.0–0.2)
EOS (ABSOLUTE): 0 10*3/uL (ref 0.0–0.4)
EOS: 0 %
HEMATOCRIT: 46.5 % (ref 34.0–46.6)
HEMOGLOBIN: 15.2 g/dL (ref 11.1–15.9)
IMMATURE GRANS (ABS): 0 10*3/uL (ref 0.0–0.1)
Immature Granulocytes: 0 %
LYMPHS: 11 %
Lymphocytes Absolute: 1.2 10*3/uL (ref 0.7–3.1)
MCH: 31.1 pg (ref 26.6–33.0)
MCHC: 32.7 g/dL (ref 31.5–35.7)
MCV: 95 fL (ref 79–97)
MONOCYTES: 6 %
Monocytes Absolute: 0.7 10*3/uL (ref 0.1–0.9)
NEUTROS ABS: 8.8 10*3/uL — AB (ref 1.4–7.0)
Neutrophils: 83 %
Platelets: 213 10*3/uL (ref 150–450)
RBC: 4.89 x10E6/uL (ref 3.77–5.28)
RDW: 13.8 % (ref 12.3–15.4)
WBC: 10.7 10*3/uL (ref 3.4–10.8)

## 2017-07-05 LAB — IGE+ALLERGENS ZONE 2(30)
Alternaria Alternata IgE: <0.1 kU/L
Bahia Grass IgE: <0.1 kU/L
Bermuda Grass IgE: <0.1 kU/L
Cedar, Mountain IgE: <0.1 kU/L
D Pteronyssinus IgE: <0.1 kU/L
Elm, American IgE: <0.1 kU/L
Hickory, White IgE: <0.1 kU/L
IgE (Immunoglobulin E), Serum: 11 IU/mL (ref 6–495)
Johnson Grass IgE: <0.1 kU/L
Mucor Racemosus IgE: <0.1 kU/L
Nettle IgE: <0.1 kU/L
Oak, White IgE: <0.1 kU/L
Penicillium Chrysogen IgE: <0.1 kU/L
Plantain, English IgE: <0.1 kU/L
Ragweed, Short IgE: <0.1 kU/L
Stemphylium Herbarum IgE: <0.1 kU/L
Timothy Grass IgE: <0.1 kU/L

## 2017-07-05 LAB — ALLERGEN STINGING INSECT PANEL
Hornet, White Face, IgE: <0.1 kU/L
Hornet, Yellow, IgE: <0.1 kU/L
Yellow Jacket, IgE: <0.1 kU/L

## 2017-07-07 ENCOUNTER — Other Ambulatory Visit: Payer: Self-pay | Admitting: Pharmacist Clinician (PhC)/ Clinical Pharmacy Specialist

## 2017-07-07 ENCOUNTER — Other Ambulatory Visit: Payer: Self-pay | Admitting: Family

## 2017-07-07 DIAGNOSIS — G4700 Insomnia, unspecified: Secondary | ICD-10-CM

## 2017-07-11 DIAGNOSIS — M2550 Pain in unspecified joint: Secondary | ICD-10-CM | POA: Diagnosis not present

## 2017-07-11 DIAGNOSIS — M797 Fibromyalgia: Secondary | ICD-10-CM | POA: Diagnosis not present

## 2017-07-11 DIAGNOSIS — M1990 Unspecified osteoarthritis, unspecified site: Secondary | ICD-10-CM | POA: Diagnosis not present

## 2017-07-11 DIAGNOSIS — M79643 Pain in unspecified hand: Secondary | ICD-10-CM | POA: Diagnosis not present

## 2017-07-11 DIAGNOSIS — M3500 Sicca syndrome, unspecified: Secondary | ICD-10-CM | POA: Diagnosis not present

## 2017-07-11 DIAGNOSIS — I776 Arteritis, unspecified: Secondary | ICD-10-CM | POA: Diagnosis not present

## 2017-07-11 DIAGNOSIS — R5382 Chronic fatigue, unspecified: Secondary | ICD-10-CM | POA: Diagnosis not present

## 2017-07-11 DIAGNOSIS — I7300 Raynaud's syndrome without gangrene: Secondary | ICD-10-CM | POA: Diagnosis not present

## 2017-07-11 DIAGNOSIS — M25462 Effusion, left knee: Secondary | ICD-10-CM | POA: Diagnosis not present

## 2017-07-11 DIAGNOSIS — M1180 Other specified crystal arthropathies, unspecified site: Secondary | ICD-10-CM | POA: Diagnosis not present

## 2017-07-11 DIAGNOSIS — M25562 Pain in left knee: Secondary | ICD-10-CM | POA: Diagnosis not present

## 2017-07-25 ENCOUNTER — Ambulatory Visit (INDEPENDENT_AMBULATORY_CARE_PROVIDER_SITE_OTHER): Payer: Medicare Other | Admitting: Pharmacist Clinician (PhC)/ Clinical Pharmacy Specialist

## 2017-07-25 DIAGNOSIS — I482 Chronic atrial fibrillation: Secondary | ICD-10-CM | POA: Diagnosis not present

## 2017-07-25 DIAGNOSIS — I4821 Permanent atrial fibrillation: Secondary | ICD-10-CM

## 2017-07-25 LAB — COAGUCHEK XS/INR WAIVED
INR: 2.7 — AB (ref 0.9–1.1)
PROTHROMBIN TIME: 32 s

## 2017-07-25 NOTE — Patient Instructions (Signed)
Description   Continue warfarin dose of 4mg  on Mondays, Wednesdays and Fridays. Take 2mg  all other days.  INR was 2.7 today (goal is 2.0 to 3.0)  Perfect reading today

## 2017-08-01 ENCOUNTER — Ambulatory Visit (INDEPENDENT_AMBULATORY_CARE_PROVIDER_SITE_OTHER): Payer: Medicare Other

## 2017-08-01 ENCOUNTER — Encounter: Payer: Self-pay | Admitting: Pediatrics

## 2017-08-01 ENCOUNTER — Ambulatory Visit (INDEPENDENT_AMBULATORY_CARE_PROVIDER_SITE_OTHER): Payer: Medicare Other | Admitting: Pediatrics

## 2017-08-01 VITALS — BP 139/77 | HR 85 | Temp 97.9°F | Ht 64.0 in | Wt 141.8 lb

## 2017-08-01 DIAGNOSIS — M545 Low back pain: Secondary | ICD-10-CM | POA: Diagnosis not present

## 2017-08-01 DIAGNOSIS — R0781 Pleurodynia: Secondary | ICD-10-CM | POA: Diagnosis not present

## 2017-08-01 DIAGNOSIS — R609 Edema, unspecified: Secondary | ICD-10-CM | POA: Diagnosis not present

## 2017-08-01 DIAGNOSIS — W19XXXA Unspecified fall, initial encounter: Secondary | ICD-10-CM

## 2017-08-01 DIAGNOSIS — M62830 Muscle spasm of back: Secondary | ICD-10-CM | POA: Diagnosis not present

## 2017-08-01 DIAGNOSIS — R2681 Unsteadiness on feet: Secondary | ICD-10-CM

## 2017-08-01 DIAGNOSIS — M546 Pain in thoracic spine: Secondary | ICD-10-CM

## 2017-08-01 DIAGNOSIS — R0789 Other chest pain: Secondary | ICD-10-CM

## 2017-08-01 DIAGNOSIS — S299XXA Unspecified injury of thorax, initial encounter: Secondary | ICD-10-CM | POA: Diagnosis not present

## 2017-08-01 LAB — BASIC METABOLIC PANEL
BUN/Creatinine Ratio: 21 (ref 12–28)
BUN: 16 mg/dL (ref 8–27)
CO2: 27 mmol/L (ref 20–29)
Calcium: 9.2 mg/dL (ref 8.7–10.3)
Chloride: 99 mmol/L (ref 96–106)
Creatinine, Ser: 0.77 mg/dL (ref 0.57–1.00)
GFR calc Af Amer: 86 mL/min/{1.73_m2} (ref >59)
GFR calc non Af Amer: 75 mL/min/{1.73_m2} (ref >59)
Glucose: 96 mg/dL (ref 65–99)
Potassium: 3.8 mmol/L (ref 3.5–5.2)
Sodium: 142 mmol/L (ref 134–144)

## 2017-08-01 MED ORDER — CYCLOBENZAPRINE HCL 5 MG PO TABS: 2.5000 mg | ORAL_TABLET | Freq: Two times a day (BID) | ORAL | 0 refills | Status: DC | PRN

## 2017-08-01 NOTE — Progress Notes (Signed)
  Subjective:   Patient ID: Angela Wong, female    DOB: 1940/01/19, 77 y.o.   MRN: [id number] CC: Chest Pain (Bilateral) and Back Pain  HPI: Angela Wong is a 77 y.o. female   H/o sjogrens, gets costochondritis, muscle pain, back pain often.  [place] about 3-4 weeks ago when she tried to take wet sock off in laundry room, fell forward into laundry baskets, had superficial scrapes. Did not hit her head. Has had pain in her R side since then.  Sleeping at night has been hard because she feels her back spasming on the right side where she fell, has been keeping her awake.  She has been taking Tylenol.  Most recent FRAX 2018, consistent with osteopenia  Relevant past medical, surgical, family and social history reviewed. Allergies and medications reviewed and updated. Social History   Tobacco Use  Smoking Status Never Smoker  Smokeless Tobacco Never Used   ROS: Per HPI   Objective:    BP 139/77   Pulse 85   Temp 97.9 F (36.6 C) (Oral)   Ht 5\' 4"  (1.626 m)   Wt 141 lb 12.8 oz (64.3 kg)   BMI 24.34 kg/m   Wt Readings from Last 3 Encounters:  08/01/17 141 lb 12.8 oz (64.3 kg)  06/17/17 138 lb 3.2 oz (62.7 kg)  05/20/17 142 lb (64.4 kg)   Gen: NAD, alert, cooperative with exam, NCAT EYES: EOMI, no conjunctival injection, or no icterus CV: NRRR, normal S1/S2, no murmur Resp: CTABL, no wheezes, normal WOB Abd: +BS, soft, NTND.  Ext: trace edema b/l, warm Neuro: Alert and oriented, strength equal b/l UE and LE, coordination grossly normal MSK: ttp along lower thoracic and upper lumbar spine, ttp along R side ribs Skin: no bruising   Assessment & Plan:  Angela Wong was seen today for chest pain and back pain.  Diagnoses and all orders for this visit:  Fall, initial encounter No fractures on xrays -     DG Thoracic Spine 2 View; Future -     DG Lumbar Spine 2-3 Views; Future -     DG Chest 2 View; Future  Unsteady gait -     Ambulatory referral to Physical  Therapy  Swelling -     Basic Metabolic Panel  Back spasm Will do trial below to help with significant spasms.  Take only if needed.  Any side effects stop medicine and call me. -     cyclobenzaprine (FLEXERIL) 5 MG tablet; Take 0.5 tablets (2.5 mg total) by mouth 2 (two) times daily as needed for muscle spasms.   Follow up plan: Return in about 2 weeks (around 08/15/2017). Assunta Found, MD [place]

## 2017-08-04 ENCOUNTER — Other Ambulatory Visit: Payer: Self-pay | Admitting: Family

## 2017-08-04 MED ORDER — AZITHROMYCIN 250 MG PO TABS: ORAL_TABLET | ORAL | 0 refills | Status: DC

## 2017-08-11 ENCOUNTER — Ambulatory Visit: Payer: Medicare Other | Attending: Pediatrics | Admitting: Physical Therapy

## 2017-08-11 ENCOUNTER — Other Ambulatory Visit: Payer: Self-pay

## 2017-08-11 DIAGNOSIS — M6281 Muscle weakness (generalized): Secondary | ICD-10-CM

## 2017-08-11 DIAGNOSIS — R2681 Unsteadiness on feet: Secondary | ICD-10-CM | POA: Insufficient documentation

## 2017-08-11 NOTE — Therapy (Signed)
[facility] [place], [place], [address] Phone: [phone]   Fax:  [phone]  Physical Therapy Evaluation  Patient Details  Name: Angela Wong MRN: [id number] Date of Birth: 28-Jun-1940 Referring Provider: Assunta Found MD.   Encounter Date: 08/11/2017  PT End of Session - 08/11/17 1241    Visit Number  1    Number of Visits  16    Date for PT Re-Evaluation  11/09/17    PT Start Time  1030    PT Stop Time  1104    PT Time Calculation (min)  34 min    Activity Tolerance  Patient tolerated treatment well    Behavior During Therapy  [facility] for tasks assessed/performed       Past Medical History:  Diagnosis Date  . A-fib ([place])   . Asthma   . Cerebral vasculitis   . COPD (chronic obstructive pulmonary disease) ([place])   . Fibromyalgia   . Gastric polyp   . GERD (gastroesophageal reflux disease)   . Hiatal hernia   . Hyperparathyroidism ([place])   . IBS (irritable bowel syndrome)   . MVP (mitral valve prolapse)   . Osteoarthritis   . Ovarian cancer ([place])    lymph node removal with hysterectomy  . Raynaud's disease   . RLS (restless legs syndrome)   . Situational depression   . Sjogren's syndrome ([place])   . Vasculitis ([place])   . Vitamin D deficiency     Past Surgical History:  Procedure Laterality Date  . ABDOMINAL HYSTERECTOMY  1982   with right oophorectomy  . APPENDECTOMY    . BREAST BIOPSY Right    x 2  . BREAST SURGERY     Biopsy  . CARPAL TUNNEL RELEASE Right 1980  . CARPAL TUNNEL RELEASE Left 2010   x 2  . CATARACT EXTRACTION Bilateral   . CESAREAN SECTION     x 3  . KNEE SURGERY Left 2005  . OOPHORECTOMY Left 1962  . PACEMAKER INSERTION  09/15/2014  . REFRACTIVE SURGERY Bilateral 2014  . TUBAL LIGATION      There were no vitals filed for this visit.   Subjective Assessment - 08/11/17 1243    Subjective  The patient presents to OPPT reporting a fall while taking off socks and increased  spianl pain after lifting a bag of mulch.  She reports diffuse spinal pain from her cervical to lumbar region.  She also reports bilateral hip and knee pain left greater than right and states she has been going in every three months for injections.  She is walking with a straight cane today.      Pertinent History  RTC deficient; COPD; A. Fib.; OA; Raynauds; RLS; Sjogren's; left knee surgery; [place]; CTS.    Limitations  Walking;Sitting;Standing    How long can you sit comfortably?  20 minutes.    How long can you stand comfortably?  10 minutes.    How long can you walk comfortably?  Short community distnances.    Patient Stated Goals  Want to be able to stand straighter and walk better.    Currently in Pain?  Other (Comment)         [facility] PT Assessment - 08/11/17 0001      Assessment   Medical Diagnosis  Unsteady gait.    Referring Provider  Assunta Found MD.    Onset Date/Surgical Date  -- Ongoing.      Precautions   Precautions  Fall PACEMAKER.  Restrictions   Weight Bearing Restrictions  No      Balance Screen   Has the patient fallen in the past 6 months  Yes    How many times?  -- 1.    Has the patient had a decrease in activity level because of a fear of falling?   Yes    Is the patient reluctant to leave their home because of a fear of falling?   Yes      [place] residence      Prior Function   Level of Independence  Independent with household mobility with device      Cognition   Overall Cognitive Status  Within Functional Limits for tasks assessed      Posture/Postural Control   Posture/Postural Control  Postural limitations    Postural Limitations  Rounded Shoulders;Forward head;Decreased lumbar lordosis;Increased thoracic kyphosis;Flexed trunk      ROM / Strength   AROM / PROM / Strength  AROM;Strength      AROM   Overall AROM Comments  WFL for bilateral LE's.  However, the patient exhibits a lack of full  bilateral knee extension left > right.  Her left knee is more swollen as well due to arhtritis.        Strength   Overall Strength Comments  Bilateral hip flexion and abduction= 4-/5 and bilateral knee strength decreased to 3+/5.  Strength decreased in part due to pain reproduction with manual muscle testing.      Special Tests    Special Tests  -- (-) Romberg test.      Transfers   Comments  Sit to stand with definite use of UE on armrests.      Ambulation/Gait   Gait Pattern  Decreased step length - right;Decreased step length - left;Decreased stride length;Right flexed knee in stance;Left flexed knee in stance;Trendelenburg;Trunk flexed;Poor foot clearance - left;Poor foot clearance - right    Gait Comments  gait with straight cane.  Left toe in.      Standardized Balance Assessment   Standardized Balance Assessment  Berg Balance Test      Berg Balance Test   Sit to Stand  Able to stand  independently using hands    Standing Unsupported  Able to stand 2 minutes with supervision    Sitting with Back Unsupported but Feet Supported on Floor or Stool  Able to sit safely and securely 2 minutes    Stand to Sit  Controls descent by using hands    Transfers  Able to transfer safely, definite need of hands    Standing Unsupported with Eyes Closed  Able to stand 10 seconds with supervision    Standing Ubsupported with Feet Together  Able to place feet together independently and stand for 1 minute with supervision    From Standing, Reach Forward with Outstretched Arm  Can reach forward >12 cm safely (5")    From Standing Position, Pick up Object from Floor  Able to pick up shoe, needs supervision    From Standing Position, Turn to Look Behind Over each Shoulder  Looks behind one side only/other side shows less weight shift    Turn 360 Degrees  Able to turn 360 degrees safely one side only in 4 seconds or less    Standing Unsupported, Alternately Place Feet on Step/Stool  Able to complete 4  steps without aid or supervision    Standing Unsupported, One Foot in [place] to  plae foot ahead of the other independently and hold 30 seconds    Standing on One Leg  Able to lift leg independently and hold equal to or more than 3 seconds    Total Score  41                Objective measurements completed on examination: See above findings.                PT Short Term Goals - 08/11/17 1301      PT SHORT TERM GOAL #1   Title  Ind with initial HEP.    Time  4    Period  Weeks    Status  New      PT SHORT TERM GOAL #2   Title  Improve Berg score to 46/56.    Time  4    Period  Weeks    Status  New        PT Long Term Goals - 08/11/17 1301      PT LONG TERM GOAL #1   Title  Ind with advanced HEP.    Time  8    Period  Weeks    Status  New      PT LONG TERM GOAL #2   Title  Patient instructed in correct posture and able to verbalize.    Time  8    Period  Weeks    Status  New      PT LONG TERM GOAL #3   Title  Patient walk in upright posture.    Time  8    Period  Weeks    Status  New      PT LONG TERM GOAL #4   Title  Improve Berg score to 49-50/56.    Time  8    Period  Weeks    Status  New             Plan - 08/11/17 1256    Clinical Impression Statement  The patient presents to OPPT with a recent fall.  She scored a 41/56 on the Berg balance test.  She demonstrated a negative Romberg test.  She c/o diffuse spinal, bilateral hip and knee pain.  She exhibits weakness over bilateral LE's.  Her pain and deficits have significantly impaired her functional mobility.  Patient will benefit from skilled physical therapy intervention to address deficits.      History and Personal Factors relevant to plan of care:  RTC deficient; COPD; A. Fib.; OA; Raynauds; RLS; Sjogren's; left knee surgery; [place]; CTS; Fibromyalgia.    Clinical Presentation  Evolving    Clinical Presentation due to:  Worsening.    Clinical Decision Making  Moderate     Rehab Potential  Fair    Clinical Impairments Affecting Rehab Potential  Chronic pain and balance deficits ongoing.    PT Frequency  2x / week    PT Duration  8 weeks    PT Treatment/Interventions  Balance training;Therapeutic exercise;Therapeutic activities;Functional mobility training;Stair training;Gait training;Neuromuscular re-education;Patient/family education    PT Next Visit Plan  Low-level balance activities, core and spinal exercises.  Patient not wanting to do Nust[place].    Consulted and Agree with Plan of Care  Patient       Patient will benefit from skilled therapeutic intervention in order to improve the following deficits and impairments:  Abnormal gait, Decreased activity tolerance, Decreased balance, Decreased coordination, Decreased strength, Difficulty walking, Pain  Visit Diagnosis: Unsteadiness on feet -  Plan: PT plan of care cert/re-cert  Muscle weakness (generalized) - Plan: PT plan of care cert/re-cert     Problem List Patient Active Problem List   Diagnosis Date Noted  . Seasonal and perennial allergic rhinitis 12/31/2016  . Allergic rhinitis with a nonallergic component 10/01/2016  . Pruritus 10/01/2016  . 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 Steroid-induced osteopenia 07/11/2016  . Hyperparathyroidism ([place]) 01/16/2016  . Iron deficiency anemia 12/18/2015  . Insomnia 12/18/2015  . Hypothyroidism 12/18/2015  . Rotator cuff tear 03/23/2014  . Osteoarthritis   . Moderate persistent asthma   . Ovarian cancer ([place])   . Fibromyalgia   . RLS (restless legs syndrome)   . Cerebral vasculitis   . Sjogren's syndrome ([place])   . Raynaud's disease   . MVP (mitral valve prolapse)   . IBS (irritable bowel syndrome)   . Congestive dilated cardiomyopathy ([place]) 02/09/2014  . Dyspnea 12/13/2013  . Hyperlipidemia 11/26/2013  . Malaise and fatigue 11/26/2013  . Multiple drug allergies 11/02/2013  . History of ovarian cancer 10/01/2013  . GERD (gastroesophageal reflux  disease) 10/01/2013  . Benign essential HTN 10/01/2013  . Atrial fibrillation, permanent ([place]) 07/27/2013    Angela Wong, Angela Wong 08/11/2017, 1:03 PM  [facility] [address] [place], [place], [address] Phone: [phone]   Fax:  [phone]  Name: Angela Wong MRN: [id number] Date of Birth: 1940-01-31

## 2017-08-12 DIAGNOSIS — I495 Sick sinus syndrome: Secondary | ICD-10-CM | POA: Diagnosis not present

## 2017-08-12 DIAGNOSIS — R001 Bradycardia, unspecified: Secondary | ICD-10-CM | POA: Diagnosis not present

## 2017-08-12 DIAGNOSIS — I472 Ventricular tachycardia: Secondary | ICD-10-CM | POA: Diagnosis not present

## 2017-08-12 DIAGNOSIS — I482 Chronic atrial fibrillation: Secondary | ICD-10-CM | POA: Diagnosis not present

## 2017-08-12 DIAGNOSIS — I10 Essential (primary) hypertension: Secondary | ICD-10-CM | POA: Diagnosis not present

## 2017-08-13 ENCOUNTER — Ambulatory Visit: Payer: Medicare Other | Admitting: Physical Therapy

## 2017-08-13 ENCOUNTER — Encounter: Payer: Self-pay | Admitting: Physical Therapy

## 2017-08-13 DIAGNOSIS — R2681 Unsteadiness on feet: Secondary | ICD-10-CM

## 2017-08-13 DIAGNOSIS — M6281 Muscle weakness (generalized): Secondary | ICD-10-CM

## 2017-08-13 NOTE — Patient Instructions (Signed)
Brushing Teeth    Place one foot on ledge and one hand on counter. Bend other knee slightly to keep back straight.  Copyright  VHI. All rights reserved.  Refrigerator   Squat with knees apart to reach lower shelves and drawers.   Copyright  VHI. All rights reserved.  Laundry Basket   Squat down and hold basket close to stand. Use leg muscles to do the work.   Copyright  VHI. All rights reserved.  Housework - Vacuuming   Hold the vacuum with arm held at side. Step back and forth to move it, keeping head up. Avoid twisting.   Copyright  VHI. All rights reserved.  Housework - Wiping   Position yourself as close as possible to reach work surface. Avoid straining your back.   Copyright  VHI. All rights reserved.  Gardening - Mowing   Keep arms close to sides and walk with lawn mower.   Copyright  VHI. All rights reserved.  Sleeping on Side   Place pillow between knees. Use cervical support under neck and a roll around waist as needed.   Copyright  VHI. All rights reserved.  Log Roll   Lying on back, bend left knee and place left arm across chest. Roll all in one movement to the right. Reverse to roll to the left. Always move as one unit.   Copyright  VHI. All rights reserved.  Stand to Sit / Sit to Stand   To sit: Bend knees to lower self onto front edge of chair, then scoot back on seat. To stand: Reverse sequence by placing one foot forward, and scoot to front of seat. Use rocking motion to stand up.  Copyright  VHI. All rights reserved.  Posture - Standing   Good posture is important. Avoid slouching and forward head thrust. Maintain curve in low back and align ears over shoul- ders, hips over ankles.   Copyright  VHI. All rights reserved.  Posture - Sitting   Sit upright, head facing forward. Try using a roll to support lower back. Keep shoulders relaxed, and avoid rounded back. Keep hips level with knees. Avoid crossing legs for long  periods.   Copyright  VHI. All rights reserved.  Computer Work   Position work to face forward. Use proper work and seat height. Keep shoulders back and down, wrists straight, and elbows at right angles. Use chair that provides full back support. Add footrest and lumbar roll as needed.   Copyright  VHI. All rights reserved.    

## 2017-08-13 NOTE — Therapy (Signed)
[facility] [place], [place], [address] Phone: [phone]   Fax:  [phone]  Physical Therapy Treatment  Patient Details  Name: Angela Wong MRN: [id number] Date of Birth: 1941-01-02 Referring Provider: Assunta Found MD.   Encounter Date: 08/13/2017  PT End of Session - 08/13/17 1348    Visit Number  2    Number of Visits  16    Date for PT Re-Evaluation  11/09/17    PT Start Time  7017    PT Stop Time  1355    PT Time Calculation (min)  49 min    Activity Tolerance  Patient tolerated treatment well    Behavior During Therapy  [facility] for tasks assessed/performed       Past Medical History:  Diagnosis Date  . A-fib ([place])   . Asthma   . Cerebral vasculitis   . COPD (chronic obstructive pulmonary disease) ([place])   . Fibromyalgia   . Gastric polyp   . GERD (gastroesophageal reflux disease)   . Hiatal hernia   . Hyperparathyroidism ([place])   . IBS (irritable bowel syndrome)   . MVP (mitral valve prolapse)   . Osteoarthritis   . Ovarian cancer ([place])    lymph node removal with hysterectomy  . Raynaud's disease   . RLS (restless legs syndrome)   . Situational depression   . Sjogren's syndrome ([place])   . Vasculitis ([place])   . Vitamin D deficiency     Past Surgical History:  Procedure Laterality Date  . ABDOMINAL HYSTERECTOMY  1982   with right oophorectomy  . APPENDECTOMY    . BREAST BIOPSY Right    x 2  . BREAST SURGERY     Biopsy  . CARPAL TUNNEL RELEASE Right 1980  . CARPAL TUNNEL RELEASE Left 2010   x 2  . CATARACT EXTRACTION Bilateral   . CESAREAN SECTION     x 3  . KNEE SURGERY Left 2005  . OOPHORECTOMY Left 1962  . PACEMAKER INSERTION  09/15/2014  . REFRACTIVE SURGERY Bilateral 2014  . TUBAL LIGATION      There were no vitals filed for this visit.  Subjective Assessment - 08/13/17 1309    Subjective  Patient arrived with some less stiffness today    Pertinent History  RTC deficient; COPD;  A. Fib.; OA; Raynauds; RLS; Sjogren's; left knee surgery; [place]; CTS.    Limitations  Walking;Sitting;Standing    How long can you sit comfortably?  20 minutes.    How long can you stand comfortably?  10 minutes.    How long can you walk comfortably?  Short community distnances.    Patient Stated Goals  Want to be able to stand straighter and walk better.    Currently in Pain?  Yes    Pain Score  5     Pain Location  -- neck, back and left hip    Pain Descriptors / Indicators  Discomfort    Pain Type  Chronic pain    Pain Onset  More than a month ago    Pain Frequency  Constant    Aggravating Factors   increased activity    Pain Relieving Factors  at rest                       [facility] Adult PT Treatment/Exercise - 08/13/17 0001      Bed Mobility   Bed Mobility  Rolling Left;Rolling Right;Supine to Sit;Sit to Supine  Self-Care   Self-Care  ADL's;Lifting;Posture;Other Self-Care Comments HEP given for the above      Exercises   Exercises  Knee/Hip;Lumbar;Shoulder      Lumbar Exercises: Supine   Ab Set  20 reps;3 seconds    Glut Set  20 reps;3 seconds    Clam  20 reps;3 seconds    Bent Knee Raise  3 seconds;Other (comment) 2x10    Other Supine Lumbar Exercises  ball squeeze 5sec x 20      Knee/Hip Exercises: Seated   Long Arc Quad  Strengthening;Both;2 sets;20 reps    Long Arc Quad Limitations  no weight today    Sit to [employer]  10 reps;with UE support      Knee/Hip Exercises: Supine   Short Arc [employer]  Strengthening;Both;3 sets;10 reps    Short Arc Quad Sets Limitations  no weight      Shoulder Exercises: Seated   Retraction  Strengthening;Both    Horizontal ABduction  Strengthening;Both;Theraband;15 reps    Theraband Level (Shoulder Horizontal ABduction)  Level 1 (Yellow)          Balance Exercises - 08/13/17 1344      Balance Exercises: Standing   Standing Eyes Opened  Foam/compliant surface;Time 37mn    Standing, One Foot on a Step  Eyes  open;6 inch;4 reps    Marching Limitations  2x10    Heel Raises Limitations  x10    Toe Raise Limitations  x10        PT Education - 08/13/17 1336    Education Details  posture awareness techniques    Person(s) Educated  Patient    Methods  Explanation;Demonstration;Handout    Comprehension  Verbalized understanding;Returned demonstration       PT Short Term Goals - 08/13/17 1352      PT SHORT TERM GOAL #1   Title  Ind with initial HEP.    Time  4    Period  Weeks    Status  Achieved      PT SHORT TERM GOAL #2   Title  Improve Berg score to 46/56.    Time  4    Period  Weeks    Status  On-going        PT Long Term Goals - 08/11/17 1301      PT LONG TERM GOAL #1   Title  Ind with advanced HEP.    Time  8    Period  Weeks    Status  New      PT LONG TERM GOAL #2   Title  Patient instructed in correct posture and able to verbalize.    Time  8    Period  Weeks    Status  New      PT LONG TERM GOAL #3   Title  Patient walk in upright posture.    Time  8    Period  Weeks    Status  New      PT LONG TERM GOAL #4   Title  Improve Berg score to 49-50/56.    Time  8    Period  Weeks    Status  New            Plan - 08/13/17 1352    Clinical Impression Statement  Patient tolerated treatment well today. Today focused on posture awareness techniques with HEP provided, then gentle posture, strengthening and balance. Patient only able to tolerated a slow progression due to a lot of pain  daily all over. Patient met STG #1 others ongoing.     Rehab Potential  Fair    Clinical Impairments Affecting Rehab Potential  Chronic pain and balance deficits ongoing.    PT Frequency  2x / week    PT Duration  8 weeks    PT Treatment/Interventions  Balance training;Therapeutic exercise;Therapeutic activities;Functional mobility training;Stair training;Gait training;Neuromuscular re-education;Patient/family education    PT Next Visit Plan  Low-level balance activities, core  and spinal exercises.  Patient not wanting to do Nustep.    Consulted and Agree with Plan of Care  Patient       Patient will benefit from skilled therapeutic intervention in order to improve the following deficits and impairments:  Abnormal gait, Decreased activity tolerance, Decreased balance, Decreased coordination, Decreased strength, Difficulty walking, Pain  Visit Diagnosis: Unsteadiness on feet  Muscle weakness (generalized)     Problem List Patient Active Problem List   Diagnosis Date Noted  . Seasonal and perennial allergic rhinitis 12/31/2016  . Allergic rhinitis with a nonallergic component 10/01/2016  . Pruritus 10/01/2016  . 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 Steroid-induced osteopenia 07/11/2016  . Hyperparathyroidism ([place]) 01/16/2016  . Iron deficiency anemia 12/18/2015  . Insomnia 12/18/2015  . Hypothyroidism 12/18/2015  . Rotator cuff tear 03/23/2014  . Osteoarthritis   . Moderate persistent asthma   . Ovarian cancer ([place])   . Fibromyalgia   . RLS (restless legs syndrome)   . Cerebral vasculitis   . Sjogren's syndrome ([place])   . Raynaud's disease   . MVP (mitral valve prolapse)   . IBS (irritable bowel syndrome)   . Congestive dilated cardiomyopathy ([place]) 02/09/2014  . Dyspnea 12/13/2013  . Hyperlipidemia 11/26/2013  . Malaise and fatigue 11/26/2013  . Multiple drug allergies 11/02/2013  . History of ovarian cancer 10/01/2013  . GERD (gastroesophageal reflux disease) 10/01/2013  . Benign essential HTN 10/01/2013  . Atrial fibrillation, permanent ([place]) 07/27/2013    Sreenidhi Ganson P, PTA 08/13/2017, 1:58 PM  [facility] [place], [place], [address] Phone: [phone]   Fax:  [phone]  Name: Angela Wong MRN: [id number] Date of Birth: 1940-06-16

## 2017-08-15 ENCOUNTER — Ambulatory Visit (INDEPENDENT_AMBULATORY_CARE_PROVIDER_SITE_OTHER): Payer: Medicare Other | Admitting: Family

## 2017-08-15 ENCOUNTER — Telehealth: Payer: Self-pay | Admitting: Family

## 2017-08-15 ENCOUNTER — Encounter: Payer: Self-pay | Admitting: Family

## 2017-08-15 ENCOUNTER — Other Ambulatory Visit: Payer: Self-pay | Admitting: *Deleted

## 2017-08-15 VITALS — BP 124/76 | HR 69 | Temp 97.5°F | Ht 64.0 in | Wt 141.8 lb

## 2017-08-15 DIAGNOSIS — F439 Reaction to severe stress, unspecified: Secondary | ICD-10-CM

## 2017-08-15 DIAGNOSIS — M62830 Muscle spasm of back: Secondary | ICD-10-CM | POA: Diagnosis not present

## 2017-08-15 DIAGNOSIS — L659 Nonscarring hair loss, unspecified: Secondary | ICD-10-CM

## 2017-08-15 DIAGNOSIS — J3089 Other allergic rhinitis: Secondary | ICD-10-CM | POA: Diagnosis not present

## 2017-08-15 DIAGNOSIS — Z1239 Encounter for other screening for malignant neoplasm of breast: Secondary | ICD-10-CM

## 2017-08-15 DIAGNOSIS — W19XXXD Unspecified fall, subsequent encounter: Secondary | ICD-10-CM

## 2017-08-15 DIAGNOSIS — E039 Hypothyroidism, unspecified: Secondary | ICD-10-CM

## 2017-08-15 DIAGNOSIS — S20219D Contusion of unspecified front wall of thorax, subsequent encounter: Secondary | ICD-10-CM | POA: Diagnosis not present

## 2017-08-15 DIAGNOSIS — Y92009 Unspecified place in unspecified non-institutional (private) residence as the place of occurrence of the external cause: Secondary | ICD-10-CM

## 2017-08-15 MED ORDER — FLUTICASONE PROPIONATE 50 MCG/ACT NA SUSP: 2.0000 | Freq: Every day | NASAL | 6 refills | Status: DC

## 2017-08-15 NOTE — Progress Notes (Signed)
Subjective:    Patient ID: MCKENZE SLONE, female    DOB: [birth year], 77 y.o.   MRN: [id number]  Chief Complaint  Patient presents with  . recheck pulled muscle in back and sinus problem   PT presents to the office today recheck back & rib pain after fall. Pt had negative xray for fracture of thoracic and lumbar. Pt states she started Physical Therapy on 08/13/17 and states her back spasms are better since starting the flexeril.   She states over the last month she has fallen and hurt her back, her dog died, a close friend died, and now her ex husband is dying in the hospital. States she feels very stressed.   She reports she feels like she is usually losing more hair than usually.  Back Pain  This is a recurrent problem. The current episode started more than 1 month ago. The problem occurs intermittently. The problem has been waxing and waning since onset. The pain is present in the lumbar spine. The quality of the pain is described as aching. The pain is at a severity of 4/10. The pain is mild. Associated symptoms include headaches and weakness. She has tried bed rest and muscle relaxant for the symptoms. The treatment provided mild relief.  Cough  This is a recurrent problem. The current episode started more than 1 month ago. The problem has been waxing and waning. The cough is non-productive. Associated symptoms include headaches, postnasal drip, rhinorrhea and a sore throat. Pertinent negatives include no ear pain or nasal congestion.      Review of Systems  HENT: Positive for postnasal drip, rhinorrhea and sore throat. Negative for ear pain.   Respiratory: Positive for cough.   Musculoskeletal: Positive for back pain.  Neurological: Positive for weakness and headaches.  All other systems reviewed and are negative.      Objective:   Physical Exam  Constitutional: She is oriented to person, place, and time. She appears well-developed and well-nourished. No distress.  HENT:    Head: Normocephalic and atraumatic.  Right Ear: External ear normal.  Left Ear: External ear normal.  Nose: Mucosal edema and rhinorrhea present.  Mouth/Throat: Posterior oropharyngeal erythema present.  Eyes: Pupils are equal, round, and reactive to light.  Neck: Normal range of motion. Neck supple. No thyromegaly present.  Cardiovascular: Normal rate, regular rhythm, normal heart sounds and intact distal pulses.  No murmur heard. Pulmonary/Chest: Effort normal and breath sounds normal. No respiratory distress. She has no wheezes.  Dry intermittent cough   Abdominal: Soft. Bowel sounds are normal. She exhibits no distension. There is no tenderness.  Musculoskeletal: She exhibits no edema or tenderness.  Generalized weakness, using cane to walk, pain in lower back with flexion or extension  Neurological: She is alert and oriented to person, place, and time. She has normal reflexes. No cranial nerve deficit.  Skin: Skin is warm and dry.  Psychiatric: She has a normal mood and affect. Her behavior is normal. Judgment and thought content normal.  Vitals reviewed.     BP 124/76   Pulse 69   Temp (!) 97.5 F (36.4 C) (Oral)   Ht '5\' 4"'  (1.626 m)   Wt 141 lb 12.8 oz (64.3 kg)   BMI 24.34 kg/m      Assessment & Plan:  TYSHIA FENTER comes in today with chief complaint of recheck pulled muscle in back and sinus problem   Diagnosis and orders addressed:  1. Allergic rhinitis with a nonallergic component -  Start flonase and Singulair  - CMP14+EGFR - fluticasone (FLONASE) 50 MCG/ACT nasal spray; Place 2 sprays into both nostrils daily.  Dispense: 16 g; Refill: 6  2. Hypothyroidism, unspecified type - CMP14+EGFR - TSH  3. Stress - CMP14+EGFR  4. Fall in home, subsequent encounter -Fall preventions discussed  - CMP14+EGFR  5. Back spasm - CMP14+EGFR  6. Contusion of rib, unspecified laterality, subsequent encounter - CMP14+EGFR  7. Hair loss - CMP14+EGFR -  TSH   Labs pending Health Maintenance reviewed Diet and exercise encouraged  Follow up plan: 3 months   [name], FNP

## 2017-08-15 NOTE — Progress Notes (Signed)
Order placed

## 2017-08-15 NOTE — Patient Instructions (Signed)

## 2017-08-16 LAB — CMP14+EGFR
A/G RATIO: 2.1 (ref 1.2–2.2)
ALT: 14 IU/L (ref 0–32)
AST: 13 IU/L (ref 0–40)
Albumin: 4.4 g/dL (ref 3.5–4.8)
Alkaline Phosphatase: 100 IU/L (ref 39–117)
BILIRUBIN TOTAL: 0.6 mg/dL (ref 0.0–1.2)
BUN / CREAT RATIO: 24 (ref 12–28)
BUN: 21 mg/dL (ref 8–27)
CHLORIDE: 98 mmol/L (ref 96–106)
CO2: 27 mmol/L (ref 20–29)
Calcium: 9.4 mg/dL (ref 8.7–10.3)
Creatinine, Ser: 0.88 mg/dL (ref 0.57–1.00)
GFR calc non Af Amer: 64 mL/min/{1.73_m2} (ref >59)
GFR, EST AFRICAN AMERICAN: 73 mL/min/{1.73_m2} (ref >59)
GLOBULIN, TOTAL: 2.1 g/dL (ref 1.5–4.5)
Glucose: 90 mg/dL (ref 65–99)
POTASSIUM: 3.7 mmol/L (ref 3.5–5.2)
SODIUM: 143 mmol/L (ref 134–144)
TOTAL PROTEIN: 6.5 g/dL (ref 6.0–8.5)

## 2017-08-16 LAB — TSH: TSH: 1.13 u[IU]/mL (ref 0.450–4.500)

## 2017-08-18 ENCOUNTER — Ambulatory Visit: Payer: Medicare Other | Attending: Pediatrics | Admitting: Physical Therapy

## 2017-08-18 ENCOUNTER — Encounter: Payer: Self-pay | Admitting: Physical Therapy

## 2017-08-18 DIAGNOSIS — R2681 Unsteadiness on feet: Secondary | ICD-10-CM | POA: Diagnosis not present

## 2017-08-18 DIAGNOSIS — M6281 Muscle weakness (generalized): Secondary | ICD-10-CM | POA: Insufficient documentation

## 2017-08-18 DIAGNOSIS — R293 Abnormal posture: Secondary | ICD-10-CM | POA: Insufficient documentation

## 2017-08-18 NOTE — Therapy (Signed)
[facility] [place], [place], [address] Phone: [phone]   Fax:  [phone]  Physical Therapy Treatment  Patient Details  Name: Angela Wong MRN: [id number] Date of Birth: September 03, 1940 Referring Provider: Assunta Found MD.   Encounter Date: 08/18/2017  PT End of Session - 08/18/17 1345    Visit Number  3    Number of Visits  16    Date for PT Re-Evaluation  11/09/17    PT Start Time  1301    PT Stop Time  1345    PT Time Calculation (min)  44 min    Activity Tolerance  Patient tolerated treatment well    Behavior During Therapy  [facility] for tasks assessed/performed       Past Medical History:  Diagnosis Date  . A-fib ([place])   . Asthma   . Cerebral vasculitis   . COPD (chronic obstructive pulmonary disease) ([place])   . Fibromyalgia   . Gastric polyp   . GERD (gastroesophageal reflux disease)   . Hiatal hernia   . Hyperparathyroidism ([place])   . IBS (irritable bowel syndrome)   . MVP (mitral valve prolapse)   . Osteoarthritis   . Ovarian cancer ([place])    lymph node removal with hysterectomy  . Raynaud's disease   . RLS (restless legs syndrome)   . Situational depression   . Sjogren's syndrome ([place])   . Vasculitis ([place])   . Vitamin D deficiency     Past Surgical History:  Procedure Laterality Date  . ABDOMINAL HYSTERECTOMY  1982   with right oophorectomy  . APPENDECTOMY    . BREAST BIOPSY Right    x 2  . BREAST SURGERY     Biopsy  . CARPAL TUNNEL RELEASE Right 1980  . CARPAL TUNNEL RELEASE Left 2010   x 2  . CATARACT EXTRACTION Bilateral   . CESAREAN SECTION     x 3  . KNEE SURGERY Left 2005  . OOPHORECTOMY Left 1962  . PACEMAKER INSERTION  09/15/2014  . REFRACTIVE SURGERY Bilateral 2014  . TUBAL LIGATION      There were no vitals filed for this visit.  Subjective Assessment - 08/18/17 1303    Subjective  Patient did well after last treatment, some soreness after lifting heavy garbage bag     Pertinent History  RTC deficient; COPD; A. Fib.; OA; Raynauds; RLS; Sjogren's; left knee surgery; [place]; CTS.    Limitations  Walking;Sitting;Standing    How long can you sit comfortably?  20 minutes.    How long can you stand comfortably?  10 minutes.    How long can you walk comfortably?  Short community distnances.    Patient Stated Goals  Want to be able to stand straighter and walk better.    Currently in Pain?  Yes    Pain Score  5     Pain Location  -- neck, back and left hip    Pain Orientation  Left    Pain Descriptors / Indicators  Discomfort    Pain Type  Chronic pain    Pain Onset  More than a month ago    Pain Frequency  Constant    Aggravating Factors   increaed activity    Pain Relieving Factors  at rest                       [facility] Adult PT Treatment/Exercise - 08/18/17 0001      Lumbar Exercises:  Supine   Ab Set  20 reps;3 seconds    Glut Set  20 reps;3 seconds    Clam  20 reps;3 seconds;Other (comment) with yellow t-band    Bent Knee Raise  3 seconds;20 reps    Other Supine Lumbar Exercises  ball squeeze 5sec x 20      Knee/Hip Exercises: Seated   Long Arc Quad  Strengthening;Both;2 sets;10 reps    Long [employer] Limitations  2      Knee/Hip Exercises: Supine   Short Arc [employer] Sets  Strengthening;Both;2 sets;10 reps    Short Arc Quad Sets Limitations  2    Straight Leg Raises  Strengthening;Both;1 set;10 reps      Shoulder Exercises: Seated   Horizontal ABduction  Strengthening;Both;Theraband;15 reps    Theraband Level (Shoulder Horizontal ABduction)  Level 1 (Yellow)    Other Seated Exercises  seated "W", "V", "T" 2x10 each      Shoulder Exercises: ROM/Strengthening   Wall Pushups  10 reps      Shoulder Exercises: Stretch   Other Shoulder Stretches  supine thoracic stretch with towel x2-48min          Balance Exercises - 08/18/17 1344      Balance Exercises: Standing   Tandem Stance  Eyes open;Foam/compliant surface;Intermittent  upper extremity support;4 reps;20 secs    Standing, One Foot on a Step  Eyes open;6 inch;4 reps    Rockerboard  Anterior/posterior 39min        PT Education - 08/18/17 1336    Education Details  HEP    Person(s) Educated  Patient    Methods  Explanation;Demonstration;Handout    Comprehension  Verbalized understanding;Returned demonstration       PT Short Term Goals - 08/13/17 1352      PT SHORT TERM GOAL #1   Title  Ind with initial HEP.    Time  4    Period  Weeks    Status  Achieved      PT SHORT TERM GOAL #2   Title  Improve Berg score to 46/56.    Time  4    Period  Weeks    Status  On-going        PT Long Term Goals - 08/18/17 1346      PT LONG TERM GOAL #1   Title  Ind with advanced HEP.    Time  8    Period  Weeks    Status  On-going      PT LONG TERM GOAL #2   Title  Patient instructed in correct posture and able to verbalize.    Time  8    Period  Weeks    Status  On-going      PT LONG TERM GOAL #3   Title  Patient walk in upright posture.    Time  8    Period  Weeks    Status  On-going      PT LONG TERM GOAL #4   Title  Improve Berg score to 49-50/56.    Time  8    Period  Weeks    Status  On-going            Plan - 08/18/17 1347    Clinical Impression Statement  Patient tolerated treatment well today. Patient able to progress balance and LE strengthening/posture exercises today. HEP given to patient today. Patient understands posture yet unable to resume in correct posture too long due to weakness. Patient progressing toward  goals.     Rehab Potential  Fair    Clinical Impairments Affecting Rehab Potential  Chronic pain and balance deficits ongoing.    PT Frequency  2x / week    PT Duration  8 weeks    PT Treatment/Interventions  Balance training;Therapeutic exercise;Therapeutic activities;Functional mobility training;Stair training;Gait training;Neuromuscular re-education;Patient/family education    PT Next Visit Plan  Low-level  balance activities, core and spinal exercises.  Patient not wanting to do Nustep.    Consulted and Agree with Plan of Care  Patient       Patient will benefit from skilled therapeutic intervention in order to improve the following deficits and impairments:  Abnormal gait, Decreased activity tolerance, Decreased balance, Decreased coordination, Decreased strength, Difficulty walking, Pain  Visit Diagnosis: Unsteadiness on feet  Muscle weakness (generalized)     Problem List Patient Active Problem List   Diagnosis Date Noted  . Seasonal and perennial allergic rhinitis 12/31/2016  . Allergic rhinitis with a nonallergic component 10/01/2016  . Pruritus 10/01/2016  . 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 Steroid-induced osteopenia 07/11/2016  . Hyperparathyroidism ([place]) 01/16/2016  . Iron deficiency anemia 12/18/2015  . Insomnia 12/18/2015  . Hypothyroidism 12/18/2015  . Rotator cuff tear 03/23/2014  . Osteoarthritis   . Moderate persistent asthma   . Ovarian cancer ([place])   . Fibromyalgia   . RLS (restless legs syndrome)   . Cerebral vasculitis   . Sjogren's syndrome ([place])   . Raynaud's disease   . MVP (mitral valve prolapse)   . IBS (irritable bowel syndrome)   . Congestive dilated cardiomyopathy ([place]) 02/09/2014  . Dyspnea 12/13/2013  . Hyperlipidemia 11/26/2013  . Malaise and fatigue 11/26/2013  . Multiple drug allergies 11/02/2013  . History of ovarian cancer 10/01/2013  . GERD (gastroesophageal reflux disease) 10/01/2013  . Benign essential HTN 10/01/2013  . Atrial fibrillation, permanent ([place]) 07/27/2013    DUNFORD, CHRISTINA P, PTA 08/18/2017, 1:49 PM  [facility] [place], [place], [address] Phone: [phone]   Fax:  [phone]  Name: Angela Wong MRN: [id number] Date of Birth: 01-05-1941

## 2017-08-18 NOTE — Patient Instructions (Addendum)
Pelvic Tilt: Posterior - Legs Bent (Supine)  Tighten stomach and flatten back by rolling pelvis down. Hold _10___ seconds. Relax. Repeat _10-30___ times per set. Do __2__ sets per session. Do _2___ sessions per day.   Bent Leg Lift (Hook-Lying)  Tighten stomach and slowly raise right leg _5___ inches from floor. Keep trunk rigid. Hold _3___ seconds. Repeat _10___ times per set. Do ___2-3_ sets per session. Do __2__ sessions per day.   Straight Leg Raise  Tighten stomach and slowly raise locked right leg __4__ inches from floor. Repeat __10-30__ times per set. Do __2__ sets per session. Do __2__ sessions per day.   Scapular Retraction: Bilateral  Facing anchor, pull arms back, bringing shoulder blades together. Repeat _30___ times per set. Do __2-3__ sets per session. Do _2___ sessions per day.     Strengthening: Wall Push-Up   5 With arms slightly wider apart than shoulder width, and feet __5__ inches from wall, gently lean body toward wall. Repeat __10__ times per set. Do _1-2___ sets per session. Do __1-2__ sessions per day.

## 2017-08-21 ENCOUNTER — Ambulatory Visit: Payer: Medicare Other | Admitting: Physical Therapy

## 2017-08-21 ENCOUNTER — Encounter: Payer: Self-pay | Admitting: Physical Therapy

## 2017-08-21 ENCOUNTER — Encounter: Payer: Self-pay | Admitting: Internal Medicine

## 2017-08-21 DIAGNOSIS — R293 Abnormal posture: Secondary | ICD-10-CM | POA: Diagnosis not present

## 2017-08-21 DIAGNOSIS — M6281 Muscle weakness (generalized): Secondary | ICD-10-CM | POA: Diagnosis not present

## 2017-08-21 DIAGNOSIS — R2681 Unsteadiness on feet: Secondary | ICD-10-CM

## 2017-08-21 NOTE — Therapy (Signed)
[facility] [place], [place], [address] Phone: [phone]   Fax:  [phone]  Physical Therapy Treatment  Patient Details  Name: Angela Wong MRN: [id number] Date of Birth: 05/29/1940 Referring Provider: Assunta Found MD.   Encounter Date: 08/21/2017  PT End of Session - 08/21/17 1341    Visit Number  4    Number of Visits  16    Date for PT Re-Evaluation  11/09/17    PT Start Time  1301    PT Stop Time  1331    PT Time Calculation (min)  30 min    Activity Tolerance  Patient limited by fatigue    Behavior During Therapy  [facility] for tasks assessed/performed       Past Medical History:  Diagnosis Date  . A-fib ([place])   . Asthma   . Cerebral vasculitis   . COPD (chronic obstructive pulmonary disease) ([place])   . Fibromyalgia   . Gastric polyp   . GERD (gastroesophageal reflux disease)   . Hiatal hernia   . Hyperparathyroidism ([place])   . IBS (irritable bowel syndrome)   . MVP (mitral valve prolapse)   . Osteoarthritis   . Ovarian cancer ([place])    lymph node removal with hysterectomy  . Raynaud's disease   . RLS (restless legs syndrome)   . Situational depression   . Sjogren's syndrome ([place])   . Vasculitis ([place])   . Vitamin D deficiency     Past Surgical History:  Procedure Laterality Date  . ABDOMINAL HYSTERECTOMY  1982   with right oophorectomy  . APPENDECTOMY    . BREAST BIOPSY Right    x 2  . BREAST SURGERY     Biopsy  . CARPAL TUNNEL RELEASE Right 1980  . CARPAL TUNNEL RELEASE Left 2010   x 2  . CATARACT EXTRACTION Bilateral   . CESAREAN SECTION     x 3  . KNEE SURGERY Left 2005  . OOPHORECTOMY Left 1962  . PACEMAKER INSERTION  09/15/2014  . REFRACTIVE SURGERY Bilateral 2014  . TUBAL LIGATION      There were no vitals filed for this visit.  Subjective Assessment - 08/21/17 1302    Subjective  Patient arrived very fatigue, and reported some soreness after last treatment    Pertinent  History  RTC deficient; COPD; A. Fib.; OA; Raynauds; RLS; Sjogren's; left knee surgery; [place]; CTS.    Limitations  Walking;Sitting;Standing    How long can you sit comfortably?  20 minutes.    How long can you stand comfortably?  10 minutes.    How long can you walk comfortably?  Short community distnances.    Patient Stated Goals  Want to be able to stand straighter and walk better.    Currently in Pain?  Yes    Pain Score  5     Pain Location  --    Pain Orientation  Left    Pain Descriptors / Indicators  Discomfort    Pain Type  Chronic pain    Pain Onset  More than a month ago    Pain Frequency  Constant    Aggravating Factors   increased activity    Pain Relieving Factors  at rest                       [facility] Adult PT Treatment/Exercise - 08/21/17 0001      Exercises   Exercises  Knee/Hip;Lumbar;Shoulder  Lumbar Exercises: Supine   Clam  20 reps;3 seconds;Other (comment);5 reps    Clam Limitations  yellow t-band    Bent Knee Raise  3 seconds    Straight Leg Raise  3 seconds    Other Supine Lumbar Exercises  ball squeeze 5sec x 20      Knee/Hip Exercises: Seated   Long Arc Quad  Strengthening;Both;10 reps;3 sets    Long Arc Quad Limitations  2# on right only      Shoulder Exercises: Standing   Other Standing Exercises  standing "W" 2x10          Balance Exercises - 08/21/17 1322      Balance Exercises: Standing   Standing Eyes Opened  Narrow base of support (BOS);Wide (BOA);Foam/compliant surface;Time    Rockerboard  Anterior/posterior          PT Short Term Goals - 08/13/17 1352      PT SHORT TERM GOAL #1   Title  Ind with initial HEP.    Time  4    Period  Weeks    Status  Achieved      PT SHORT TERM GOAL #2   Title  Improve Berg score to 46/56.    Time  4    Period  Weeks    Status  On-going        PT Long Term Goals - 08/18/17 1346      PT LONG TERM GOAL #1   Title  Ind with advanced HEP.    Time  8    Period   Weeks    Status  On-going      PT LONG TERM GOAL #2   Title  Patient instructed in correct posture and able to verbalize.    Time  8    Period  Weeks    Status  On-going      PT LONG TERM GOAL #3   Title  Patient walk in upright posture.    Time  8    Period  Weeks    Status  On-going      PT LONG TERM GOAL #4   Title  Improve Berg score to 49-50/56.    Time  8    Period  Weeks    Status  On-going            Plan - 08/21/17 1341    Clinical Impression Statement  Patient tolerated treatment fair today. Patient very fatigue upon arriveal and required rest breaks. Patient unsure of reason for fatigue. Patient was sore afer using weights on left knee. Patient able to complete some exercises and balance activities. Goals ongoing due to balance and weakness.    Rehab Potential  Fair    Clinical Impairments Affecting Rehab Potential  Chronic pain and balance deficits ongoing.    PT Frequency  2x / week    PT Duration  8 weeks    PT Treatment/Interventions  Balance training;Therapeutic exercise;Therapeutic activities;Functional mobility training;Stair training;Gait training;Neuromuscular re-education;Patient/family education    PT Next Visit Plan  Low-level balance activities, core and spinal exercises.  No Nustep.    Consulted and Agree with Plan of Care  Patient       Patient will benefit from skilled therapeutic intervention in order to improve the following deficits and impairments:  Abnormal gait, Decreased activity tolerance, Decreased balance, Decreased coordination, Decreased strength, Difficulty walking, Pain  Visit Diagnosis: Unsteadiness on feet  Muscle weakness (generalized)  Abnormal posture     Problem  List Patient Active Problem List   Diagnosis Date Noted  . Seasonal and perennial allergic rhinitis 12/31/2016  . Allergic rhinitis with a nonallergic component 10/01/2016  . Pruritus 10/01/2016  . 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 Steroid-induced  osteopenia 07/11/2016  . Hyperparathyroidism ([place]) 01/16/2016  . Iron deficiency anemia 12/18/2015  . Insomnia 12/18/2015  . Hypothyroidism 12/18/2015  . Rotator cuff tear 03/23/2014  . Osteoarthritis   . Moderate persistent asthma   . Ovarian cancer ([place])   . Fibromyalgia   . RLS (restless legs syndrome)   . Cerebral vasculitis   . Sjogren's syndrome ([place])   . Raynaud's disease   . MVP (mitral valve prolapse)   . IBS (irritable bowel syndrome)   . Congestive dilated cardiomyopathy ([place]) 02/09/2014  . Dyspnea 12/13/2013  . Hyperlipidemia 11/26/2013  . Malaise and fatigue 11/26/2013  . Multiple drug allergies 11/02/2013  . History of ovarian cancer 10/01/2013  . GERD (gastroesophageal reflux disease) 10/01/2013  . Benign essential HTN 10/01/2013  . Atrial fibrillation, permanent ([place]) 07/27/2013    DUNFORD, CHRISTINA P, PTA 08/21/2017, 1:46 PM  [facility] [place], [place], [address] Phone: [phone]   Fax:  [phone]  Name: Angela Wong MRN: [id number] Date of Birth: 01-08-1941

## 2017-08-25 ENCOUNTER — Emergency Department (HOSPITAL_COMMUNITY): Payer: Medicare Other

## 2017-08-25 ENCOUNTER — Encounter: Payer: Self-pay | Admitting: Family

## 2017-08-25 ENCOUNTER — Encounter (HOSPITAL_COMMUNITY): Payer: Self-pay | Admitting: *Deleted

## 2017-08-25 ENCOUNTER — Other Ambulatory Visit: Payer: Self-pay

## 2017-08-25 ENCOUNTER — Observation Stay (HOSPITAL_COMMUNITY)
Admission: EM | Admit: 2017-08-25 | Discharge: 2017-08-26 | Disposition: A | Discharge status: Home / Self Care | Payer: Medicare Other | Attending: Internal Medicine | Admitting: Internal Medicine

## 2017-08-25 ENCOUNTER — Ambulatory Visit (INDEPENDENT_AMBULATORY_CARE_PROVIDER_SITE_OTHER): Payer: Medicare Other | Admitting: Family

## 2017-08-25 ENCOUNTER — Ambulatory Visit: Payer: Medicare Other | Admitting: Physical Therapy

## 2017-08-25 VITALS — BP 158/88 | HR 84 | Temp 97.9°F

## 2017-08-25 DIAGNOSIS — R0789 Other chest pain: Secondary | ICD-10-CM | POA: Diagnosis not present

## 2017-08-25 DIAGNOSIS — J4540 Moderate persistent asthma, uncomplicated: Secondary | ICD-10-CM | POA: Diagnosis present

## 2017-08-25 DIAGNOSIS — Z950 Presence of cardiac pacemaker: Secondary | ICD-10-CM | POA: Diagnosis not present

## 2017-08-25 DIAGNOSIS — E785 Hyperlipidemia, unspecified: Secondary | ICD-10-CM | POA: Insufficient documentation

## 2017-08-25 DIAGNOSIS — I4821 Permanent atrial fibrillation: Secondary | ICD-10-CM | POA: Diagnosis present

## 2017-08-25 DIAGNOSIS — E039 Hypothyroidism, unspecified: Secondary | ICD-10-CM | POA: Diagnosis present

## 2017-08-25 DIAGNOSIS — R0602 Shortness of breath: Secondary | ICD-10-CM | POA: Diagnosis not present

## 2017-08-25 DIAGNOSIS — I341 Nonrheumatic mitral (valve) prolapse: Secondary | ICD-10-CM | POA: Insufficient documentation

## 2017-08-25 DIAGNOSIS — Z79899 Other long term (current) drug therapy: Secondary | ICD-10-CM | POA: Diagnosis not present

## 2017-08-25 DIAGNOSIS — R197 Diarrhea, unspecified: Secondary | ICD-10-CM | POA: Diagnosis not present

## 2017-08-25 DIAGNOSIS — Z7901 Long term (current) use of anticoagulants: Secondary | ICD-10-CM | POA: Insufficient documentation

## 2017-08-25 DIAGNOSIS — R079 Chest pain, unspecified: Principal | ICD-10-CM | POA: Diagnosis present

## 2017-08-25 DIAGNOSIS — I420 Dilated cardiomyopathy: Secondary | ICD-10-CM | POA: Diagnosis not present

## 2017-08-25 DIAGNOSIS — I482 Chronic atrial fibrillation: Secondary | ICD-10-CM

## 2017-08-25 DIAGNOSIS — I4891 Unspecified atrial fibrillation: Secondary | ICD-10-CM | POA: Diagnosis not present

## 2017-08-25 DIAGNOSIS — I10 Essential (primary) hypertension: Secondary | ICD-10-CM | POA: Diagnosis not present

## 2017-08-25 DIAGNOSIS — M8580 Other specified disorders of bone density and structure, unspecified site: Secondary | ICD-10-CM | POA: Insufficient documentation

## 2017-08-25 DIAGNOSIS — K219 Gastro-esophageal reflux disease without esophagitis: Secondary | ICD-10-CM | POA: Diagnosis not present

## 2017-08-25 DIAGNOSIS — T380X5A Adverse effect of glucocorticoids and synthetic analogues, initial encounter: Secondary | ICD-10-CM

## 2017-08-25 DIAGNOSIS — R110 Nausea: Secondary | ICD-10-CM | POA: Diagnosis not present

## 2017-08-25 DIAGNOSIS — M5489 Other dorsalgia: Secondary | ICD-10-CM | POA: Diagnosis not present

## 2017-08-25 DIAGNOSIS — R531 Weakness: Secondary | ICD-10-CM

## 2017-08-25 DIAGNOSIS — J9811 Atelectasis: Secondary | ICD-10-CM | POA: Diagnosis not present

## 2017-08-25 DIAGNOSIS — M3500 Sicca syndrome, unspecified: Secondary | ICD-10-CM | POA: Diagnosis not present

## 2017-08-25 LAB — COMPREHENSIVE METABOLIC PANEL
ALT: 15 U/L (ref 0–44)
ANION GAP: 9 (ref 5–15)
AST: 16 U/L (ref 15–41)
Albumin: 3.9 g/dL (ref 3.5–5.0)
Alkaline Phosphatase: 79 U/L (ref 38–126)
BUN: 16 mg/dL (ref 8–23)
CHLORIDE: 102 mmol/L (ref 98–111)
CO2: 32 mmol/L (ref 22–32)
Calcium: 9.1 mg/dL (ref 8.9–10.3)
Creatinine, Ser: 0.71 mg/dL (ref 0.44–1.00)
Glucose, Bld: 109 mg/dL — ABNORMAL HIGH (ref 70–99)
POTASSIUM: 3.4 mmol/L — AB (ref 3.5–5.1)
Sodium: 143 mmol/L (ref 135–145)
Total Bilirubin: 1.1 mg/dL (ref 0.3–1.2)
Total Protein: 6.8 g/dL (ref 6.5–8.1)

## 2017-08-25 LAB — CBC WITH DIFFERENTIAL/PLATELET
BASOS ABS: 0 10*3/uL (ref 0.0–0.1)
Basophils Relative: 0 %
Eosinophils Absolute: 0 10*3/uL (ref 0.0–0.7)
Eosinophils Relative: 0 %
HEMATOCRIT: 44.7 % (ref 36.0–46.0)
HEMOGLOBIN: 15 g/dL (ref 12.0–15.0)
LYMPHS PCT: 9 %
Lymphs Abs: 1 10*3/uL (ref 0.7–4.0)
MCH: 31.8 pg (ref 26.0–34.0)
MCHC: 33.6 g/dL (ref 30.0–36.0)
MCV: 94.9 fL (ref 78.0–100.0)
Monocytes Absolute: 0.7 10*3/uL (ref 0.1–1.0)
Monocytes Relative: 6 %
NEUTROS ABS: 8.7 10*3/uL — AB (ref 1.7–7.7)
NEUTROS PCT: 85 %
Platelets: 190 10*3/uL (ref 150–400)
RBC: 4.71 MIL/uL (ref 3.87–5.11)
RDW: 13.3 % (ref 11.5–15.5)
WBC: 10.4 10*3/uL (ref 4.0–10.5)

## 2017-08-25 LAB — PROTIME-INR
INR: 1.61
Prothrombin Time: 19 seconds — ABNORMAL HIGH (ref 11.4–15.2)

## 2017-08-25 LAB — TROPONIN I
TROPONIN I: 0.03 ng/mL — AB (ref <0.03)
Troponin I: <0.03 ng/mL (ref <0.03)

## 2017-08-25 LAB — TSH: TSH: 0.964 u[IU]/mL (ref 0.350–4.500)

## 2017-08-25 LAB — VITAMIN B12: VITAMIN B 12: 892 pg/mL (ref 180–914)

## 2017-08-25 MED ORDER — ATENOLOL 25 MG PO TABS
12.5000 mg | ORAL_TABLET | Freq: Every day | ORAL | Status: DC
  Administered 2017-08-26: 12.5 mg via ORAL
  Filled 2017-08-25: qty 1

## 2017-08-25 MED ORDER — PREDNISONE 10 MG PO TABS
5.0000 mg | ORAL_TABLET | Freq: Every day | ORAL | Status: DC
  Administered 2017-08-26: 5 mg via ORAL
  Filled 2017-08-25: qty 1

## 2017-08-25 MED ORDER — WARFARIN SODIUM 2 MG PO TABS
4.0000 mg | ORAL_TABLET | Freq: Once | ORAL | Status: AC
  Administered 2017-08-25: 4 mg via ORAL
  Filled 2017-08-25: qty 2
  Filled 2017-08-25: qty 1

## 2017-08-25 MED ORDER — ASPIRIN EC 325 MG PO TBEC
325.0000 mg | DELAYED_RELEASE_TABLET | Freq: Every day | ORAL | Status: DC
  Filled 2017-08-25 (×2): qty 1

## 2017-08-25 MED ORDER — FLUTICASONE PROPIONATE 50 MCG/ACT NA SUSP
2.0000 | Freq: Every day | NASAL | Status: DC
  Administered 2017-08-26: 2 via NASAL
  Filled 2017-08-25: qty 16

## 2017-08-25 MED ORDER — PANTOPRAZOLE SODIUM 40 MG PO TBEC
40.0000 mg | DELAYED_RELEASE_TABLET | Freq: Every day | ORAL | Status: DC
  Administered 2017-08-26: 40 mg via ORAL
  Filled 2017-08-25: qty 1

## 2017-08-25 MED ORDER — LOSARTAN POTASSIUM 50 MG PO TABS
100.0000 mg | ORAL_TABLET | Freq: Every day | ORAL | Status: DC
  Administered 2017-08-26: 100 mg via ORAL
  Filled 2017-08-25: qty 2

## 2017-08-25 MED ORDER — POLYSACCHARIDE IRON COMPLEX 150 MG PO CAPS
150.0000 mg | ORAL_CAPSULE | Freq: Every day | ORAL | Status: DC
  Administered 2017-08-26: 150 mg via ORAL
  Filled 2017-08-25: qty 1

## 2017-08-25 MED ORDER — BUMETANIDE 1 MG PO TABS
0.5000 mg | ORAL_TABLET | ORAL | Status: DC
  Administered 2017-08-26: 0.5 mg via ORAL
  Filled 2017-08-25: qty 1

## 2017-08-25 MED ORDER — MORPHINE SULFATE (PF) 2 MG/ML IV SOLN: 2.0000 mg | INTRAVENOUS | Status: DC | PRN

## 2017-08-25 MED ORDER — OLOPATADINE HCL 0.1 % OP SOLN
1.0000 [drp] | Freq: Two times a day (BID) | OPHTHALMIC | Status: DC
  Administered 2017-08-25 – 2017-08-26 (×2): 1 [drp] via OPHTHALMIC
  Filled 2017-08-25: qty 5

## 2017-08-25 MED ORDER — ONDANSETRON HCL 4 MG/2ML IJ SOLN: 4.0000 mg | Freq: Four times a day (QID) | INTRAMUSCULAR | Status: DC | PRN

## 2017-08-25 MED ORDER — WARFARIN - PHARMACIST DOSING INPATIENT: Freq: Every day | Status: DC

## 2017-08-25 MED ORDER — ALBUTEROL SULFATE (2.5 MG/3ML) 0.083% IN NEBU: 3.0000 mL | INHALATION_SOLUTION | Freq: Four times a day (QID) | RESPIRATORY_TRACT | Status: DC | PRN

## 2017-08-25 MED ORDER — FAMOTIDINE 20 MG PO TABS
20.0000 mg | ORAL_TABLET | Freq: Every day | ORAL | Status: DC
  Administered 2017-08-26: 20 mg via ORAL
  Filled 2017-08-25: qty 1

## 2017-08-25 MED ORDER — MONTELUKAST SODIUM 10 MG PO TABS
10.0000 mg | ORAL_TABLET | Freq: Every day | ORAL | Status: DC
  Administered 2017-08-25: 10 mg via ORAL
  Filled 2017-08-25: qty 1

## 2017-08-25 MED ORDER — AZELASTINE HCL 0.1 % NA SOLN
2.0000 | Freq: Two times a day (BID) | NASAL | Status: DC
  Administered 2017-08-25 – 2017-08-26 (×2): 2 via NASAL
  Filled 2017-08-25: qty 30

## 2017-08-25 MED ORDER — BUMETANIDE 1 MG PO TABS: 1.0000 mg | ORAL_TABLET | ORAL | Status: DC

## 2017-08-25 MED ORDER — NITROGLYCERIN IN D5W 200-5 MCG/ML-% IV SOLN
5.0000 ug/min | Freq: Once | INTRAVENOUS | Status: AC
  Administered 2017-08-25: 5 ug/min via INTRAVENOUS
  Filled 2017-08-25: qty 250

## 2017-08-25 MED ORDER — TRAZODONE HCL 50 MG PO TABS
150.0000 mg | ORAL_TABLET | Freq: Every evening | ORAL | Status: DC | PRN
  Administered 2017-08-26: 150 mg via ORAL
  Filled 2017-08-25: qty 3

## 2017-08-25 MED ORDER — DOCUSATE SODIUM 100 MG PO CAPS
400.0000 mg | ORAL_CAPSULE | Freq: Every day | ORAL | Status: DC
  Administered 2017-08-26: 400 mg via ORAL
  Filled 2017-08-25: qty 4

## 2017-08-25 MED ORDER — ENOXAPARIN SODIUM 40 MG/0.4ML ~~LOC~~ SOLN
40.0000 mg | SUBCUTANEOUS | Status: DC
  Filled 2017-08-25: qty 0.4

## 2017-08-25 MED ORDER — CYCLOBENZAPRINE HCL 5 MG PO TABS
2.5000 mg | ORAL_TABLET | Freq: Two times a day (BID) | ORAL | Status: DC | PRN
  Administered 2017-08-25 – 2017-08-26 (×2): 2.5 mg via ORAL
  Filled 2017-08-25 (×3): qty 0.5

## 2017-08-25 MED ORDER — GI COCKTAIL ~~LOC~~: 30.0000 mL | Freq: Four times a day (QID) | ORAL | Status: DC | PRN

## 2017-08-25 MED ORDER — ACETAMINOPHEN 325 MG PO TABS
650.0000 mg | ORAL_TABLET | ORAL | Status: DC | PRN
  Administered 2017-08-25: 650 mg via ORAL
  Filled 2017-08-25: qty 2

## 2017-08-25 MED ORDER — LEVOTHYROXINE SODIUM 100 MCG PO TABS
100.0000 ug | ORAL_TABLET | Freq: Every day | ORAL | Status: DC
  Administered 2017-08-26: 100 ug via ORAL
  Filled 2017-08-25: qty 1

## 2017-08-25 MED ORDER — LORATADINE 10 MG PO TABS
10.0000 mg | ORAL_TABLET | Freq: Every day | ORAL | Status: DC
  Administered 2017-08-26: 10 mg via ORAL
  Filled 2017-08-25: qty 1

## 2017-08-25 MED ORDER — MOMETASONE FURO-FORMOTEROL FUM 200-5 MCG/ACT IN AERO
2.0000 | INHALATION_SPRAY | Freq: Two times a day (BID) | RESPIRATORY_TRACT | Status: DC
  Administered 2017-08-25 – 2017-08-26 (×2): 2 via RESPIRATORY_TRACT
  Filled 2017-08-25: qty 8.8

## 2017-08-25 MED ORDER — CYCLOSPORINE 0.05 % OP EMUL
1.0000 [drp] | Freq: Two times a day (BID) | OPHTHALMIC | Status: DC
  Administered 2017-08-25 – 2017-08-26 (×2): 1 [drp] via OPHTHALMIC
  Filled 2017-08-25 (×2): qty 1

## 2017-08-25 NOTE — Progress Notes (Signed)
ANTICOAGULATION CONSULT NOTE - Initial Consult  Pharmacy Consult for warfarin Indication: atrial fibrillation  Allergies  Allergen Reactions  . Cephalosporins Hives and Shortness Of Breath  . Ciprofloxacin Hives and Shortness Of Breath  . Doxycycline Hives and Shortness Of Breath  . Horse-Derived Products Anaphylaxis  . Ketek [Telithromycin] Palpitations    Chest discomfort  . Nitrofuran Derivatives Anaphylaxis and Hives    blisters  . Nitrous Oxide Nausea And Vomiting    Severe due to Sjogrens (Auto-Immune Disease)  . Other Anaphylaxis    ALLERGY TO HORSE SERUM  . Penicillins Hives and Shortness Of Breath  . Pentazocine Other (See Comments)    Other reaction(s): Mental Status Changes (intolerance) Altered Mental Status  Altered Mental Status  Altered Mental Status   . Sulfa Antibiotics Hives and Shortness Of Breath  . Trovan [Alatrofloxacin] Palpitations, Other (See Comments) and Anaphylaxis    Chest pain, dizziness, irregular pulse  . Amlodipine Swelling  . Calcium Channel Blockers     Respiratory distress  . Carvedilol Other (See Comments)    Dizziness, "joint pain, depression"  . Codeine Nausea Only  . Cymbalta [Duloxetine Hcl] Swelling  . Diltiazem     Swollen throat   . Diovan [Valsartan] Swelling  . Lisinopril Swelling  . Sertraline   . Tramadol Nausea Only  . Zoloft [Sertraline Hcl]   . Loteprednol Etabonate Rash    Patient Measurements: Height: 5\' 4"  (162.6 cm) Weight: 136 lb (61.7 kg) IBW/kg (Calculated) : 54.7   Vital Signs: Temp: 97.5 F (36.4 C) (08/12 1038) Temp Source: Oral (08/12 1038) BP: 139/66 (08/12 1300) Pulse Rate: 70 (08/12 1300)  Labs: Recent Labs    08/25/17 1209  HGB 15.0  HCT 44.7  PLT 190  LABPROT 19.0*  INR 1.61  CREATININE 0.71  TROPONINI 0.03*    Estimated Creatinine Clearance: 50.9 mL/min (by C-G formula based on SCr of 0.71 mg/dL).   Medical History: Past Medical History:  Diagnosis Date  . A-fib ([place])   .  Asthma   . Cerebral vasculitis   . COPD (chronic obstructive pulmonary disease) ([place])   . Fibromyalgia   . Gastric polyp   . GERD (gastroesophageal reflux disease)   . Hiatal hernia   . Hyperparathyroidism ([place])   . IBS (irritable bowel syndrome)   . MVP (mitral valve prolapse)   . Osteoarthritis   . Ovarian cancer ([place])    lymph node removal with hysterectomy  . Raynaud's disease   . RLS (restless legs syndrome)   . Situational depression   . Sjogren's syndrome ([place])   . Vasculitis ([place])   . Vitamin D deficiency     Medications:   (Not in a hospital admission)  Assessment: Pharmacy consulted to dose warfarin in patient with atrial fibrillation. INR on admission is subtherapeutic at 1.61. Patient's home dose of warfarin is 4 mg MW and 2 mg ROW.  Goal of Therapy:  INR 2-3 Monitor platelets by anticoagulation protocol: Yes   Plan:  Warfarin 4 mg x 1 dose Monitor daily INR and s/s of bleeding  Margot Ables, PharmD Clinical Pharmacist 08/25/2017 2:05 PM

## 2017-08-25 NOTE — H&P (Signed)
History and Physical    MORGIN HALLS URK:[id number] DOB: March 18, 1940 DOA: 08/25/2017  Referring MD/NP/PA: Davonna Belling, EDP PCP: [name], FNP  Patient coming from: PCPs office  Chief Complaint: Chest pain and weakness  HPI: Angela Wong is a 77 y.o. female with multiple medical comorbidities including atrial fibrillation, sick sinus syndrome status post permanent pacemaker, COPD, hypothyroidism, mitral valve prolapse, restless leg syndrome, Sjogren's syndrome on chronic prednisone as well as dilated cardiomyopathy who presents to the hospital today with chest pain and weakness.  She has been under a lot of social stressors with the death of 2 close family members and her dog all within the past 2 months.  She states she has been getting progressively weak.  However last night in the afternoon she started experiencing some substernal chest discomfort that she describes as a heaviness in her chest, no radiation, associated with some mild dyspnea on exertion but no palpitations or dizziness.  This morning, because her pain had not resolved, she went to visit her primary care physician later referred her to the hospital for evaluation.  She was initially found to be markedly hypertensive with a blood pressure of 190/100, labs are essentially unremarkable except for a slightly low potassium at 3.5 and a slightly elevated troponin at 0.03.  Admission was requested for chest pain evaluation.  Past Medical/Surgical History: Past Medical History:  Diagnosis Date  . A-fib ([place])   . Asthma   . Cerebral vasculitis   . COPD (chronic obstructive pulmonary disease) ([place])   . Fibromyalgia   . Gastric polyp   . GERD (gastroesophageal reflux disease)   . Hiatal hernia   . Hyperparathyroidism ([place])   . IBS (irritable bowel syndrome)   . MVP (mitral valve prolapse)   . Osteoarthritis   . Ovarian cancer ([place])    lymph node removal with hysterectomy  . Raynaud's disease   . RLS (restless  legs syndrome)   . Situational depression   . Sjogren's syndrome ([place])   . Vasculitis ([place])   . Vitamin D deficiency     Past Surgical History:  Procedure Laterality Date  . ABDOMINAL HYSTERECTOMY  1982   with right oophorectomy  . APPENDECTOMY    . BREAST BIOPSY Right    x 2  . BREAST SURGERY     Biopsy  . CARPAL TUNNEL RELEASE Right 1980  . CARPAL TUNNEL RELEASE Left 2010   x 2  . CATARACT EXTRACTION Bilateral   . CESAREAN SECTION     x 3  . KNEE SURGERY Left 2005  . OOPHORECTOMY Left 1962  . PACEMAKER INSERTION  09/15/2014  . REFRACTIVE SURGERY Bilateral 2014  . TUBAL LIGATION      Social History:  reports that she has never smoked. She has never used smokeless tobacco. She reports that she does not drink alcohol or use drugs.  Allergies: Allergies  Allergen Reactions  . Cephalosporins Hives and Shortness Of Breath  . Ciprofloxacin Hives and Shortness Of Breath  . Doxycycline Hives and Shortness Of Breath  . Horse-Derived Products Anaphylaxis  . Ketek [Telithromycin] Palpitations    Chest discomfort  . Nitrofuran Derivatives Anaphylaxis and Hives    blisters  . Nitrous Oxide Nausea And Vomiting    Severe due to Sjogrens (Auto-Immune Disease)  . Other Anaphylaxis    ALLERGY TO HORSE SERUM  . Penicillins Hives and Shortness Of Breath  . Pentazocine Other (See Comments)    Other reaction(s): Mental Status Changes (intolerance) Altered  Mental Status  Altered Mental Status  Altered Mental Status   . Sulfa Antibiotics Hives and Shortness Of Breath  . Trovan [Alatrofloxacin] Palpitations, Other (See Comments) and Anaphylaxis    Chest pain, dizziness, irregular pulse  . Amlodipine Swelling  . Calcium Channel Blockers     Respiratory distress  . Carvedilol Other (See Comments)    Dizziness, "joint pain, depression"  . Codeine Nausea Only  . Cymbalta [Duloxetine Hcl] Swelling  . Diltiazem     Swollen throat   . Diovan [Valsartan] Swelling  . Lisinopril  Swelling  . Sertraline   . Tramadol Nausea Only  . Zoloft [Sertraline Hcl]   . Loteprednol Etabonate Rash    Family History:  Family History  Problem Relation Age of Onset  . COPD Mother   . Breast cancer Mother   . Allergic rhinitis Mother   . Heart disease Father        No details  . Kidney disease Father   . Allergic rhinitis Father   . Stroke Sister   . Arthritis/Rheumatoid Sister   . Asthma Sister   . Lupus Sister   . Heart attack Sister   . Stroke Paternal Grandmother   . Scleroderma Grandchild   . Thyroid disease Other   . Breast cancer Maternal Aunt        x 2    Prior to Admission medications   Medication Sig Start Date End Date Taking? Authorizing Provider  albuterol (PROVENTIL HFA;VENTOLIN HFA) 108 (90 Base) MCG/ACT inhaler Inhale 2 puffs into the lungs every 6 (six) hours as needed for wheezing. 10/01/16  Yes Bobbitt, Sed[place] Muta, MD  atenolol (TENORMIN) 25 MG tablet Take 12.5 mg by mouth daily. 03/11/16  Yes [provider]  azelastine (ASTELIN) 0.1 % nasal spray Place 2 sprays into both nostrils 2 (two) times daily. 01/21/17  Yes Bobbitt, [place] Muta, MD  B Complex-C (B-COMPLEX WITH VITAMIN C) tablet Take 1 tablet by mouth daily.   Yes [provider]  B Complex-C (B-COMPLEX WITH VITAMIN C) tablet Take 1 tablet by mouth daily.   Yes [provider]  budesonide-formoterol (SYMBICORT) 160-4.5 MCG/ACT inhaler Inhale 2 puffs into the lungs 2 (two) times daily. 05/13/17  Yes Hawks, Christy A, FNP  bumetanide (BUMEX) 0.5 MG tablet Take 1 tablet (0.5 mg total) by mouth every other day. 12/18/16  Yes Stacks, Cletus Gash, MD  bumetanide (BUMEX) 1 MG tablet Take 1 tablet (1 mg total) by mouth every other day. On opposite day of 0.5mg  of bumetadnide 12/18/16  Yes Stacks, Cletus Gash, MD  Cholecalciferol (VITAMIN D) 2000 UNITS CAPS Take 2 capsules by mouth every evening.    Yes [provider]  Cyanocobalamin (VITAMIN B 12 PO) Take 500 mg by mouth daily.    Yes [provider]  cyclobenzaprine (FLEXERIL) 5 MG tablet Take 0.5 tablets (2.5 mg total) by mouth 2 (two) times daily as needed for muscle spasms. 08/01/17  Yes Eustaquio Maize, MD  cycloSPORINE (RESTASIS) 0.05 % ophthalmic emulsion Place 1 drop into both eyes 2 (two) times daily. 11/14/16  Yes Hawks, Christy A, FNP  docusate sodium (COLACE) 100 MG capsule Take 400 mg by mouth daily.    Yes [provider]  esomeprazole (NEXIUM) 40 MG capsule Take 1 capsule (40 mg total) by mouth 2 (two) times daily before a meal. 04/04/17  Yes Pyrtle, Lajuan Lines, MD  fexofenadine (ALLEGRA) 180 MG tablet Take 180 mg by mouth every morning.    Yes [provider]  Iron Polysacch Cmplx-B12-FA (POLY-IRON 150 FORTE) 150-0.025-1 MG CAPS Take 1 tablet by mouth daily. 03/07/17  Yes Memory Argue, PharmD  levothyroxine (SYNTHROID, LEVOTHROID) 50 MCG tablet Take 100 mcg by mouth daily. 06/25/17  Yes [provider]  losartan (COZAAR) 100 MG tablet TAKE 1 TABLET BY MOUTH ONCE DAILY (NEEDS  APPOINTMENT) 07/08/17  Yes Hawks, Christy A, FNP  Magnesium 200 MG TABS Take 1 tablet by mouth daily.   Yes [provider]  metroNIDAZOLE (METROGEL) 1 % gel Apply topically daily. 10/07/16  Yes Hawks, Christy A, FNP  montelukast (SINGULAIR) 10 MG tablet TAKE 1 TABLET BY MOUTH ONCE DAILY AT BEDTIME 06/10/17  Yes Hawks, Christy A, FNP  Olopatadine HCl 0.2 % SOLN Apply 1 drop to eye daily. 11/14/16  Yes Hawks, Christy A, FNP  predniSONE (DELTASONE) 5 MG tablet TAKE 1 TABLET BY MOUTH ONCE DAILY 06/10/17  Yes Hawks, Christy A, FNP  ranitidine (ZANTAC) 150 MG tablet Take 1 tablet (150 mg total) by mouth at bedtime. 06/16/17  Yes Pyrtle, Lajuan Lines, MD  Simethicone (GAS-X EXTRA STRENGTH) 125 MG CAPS Take 2 capsules by mouth daily as needed (for relief).    Yes [provider]  traZODone (DESYREL) 150 MG tablet TAKE 1 TABLET BY MOUTH AT BEDTIME 07/08/17  Yes Hawks, Christy A, FNP  warfarin (COUMADIN) 1 MG  tablet TAKE 2 TO 4 TABLETS (= 2 TO 4MG ) BY MOUTH ONCE DAILY AS DIRECTED BY ANTICOGULATION CLINIC. 03/07/17  Yes Memory Argue, PharmD  azithromycin (ZITHROMAX) 250 MG tablet azithromycin 250 mg tablet  TAKE 2 TABLETS BY MOUTH TODAY, THEN TAKE 1 TABLET DAILY FOR 4 DAYS    [provider]  clindamycin (CLEOCIN) 150 MG capsule TAKE FOUR CAPSULES BY MOUTH ONE HOUR BEFORE DENTAL APPOINTMENT 08/19/17   [provider]  fluticasone (FLONASE) 50 MCG/ACT nasal spray Place 2 sprays into both nostrils daily. 08/15/17   Hawks, Christy A, FNP  PREVIDENT 5000 DRY MOUTH 1.1 % GEL dental gel Place 1 application onto teeth as needed (as needed for dry mouth).  10/13/13   [provider]    Review of Systems:  Constitutional: Denies fever, chills, diaphoresis, appetite change and fatigue.  HEENT: Denies photophobia, eye pain, redness, hearing loss, ear pain, congestion, sore throat, rhinorrhea, sneezing, mouth sores, trouble swallowing, neck pain, neck stiffness and tinnitus.   Respiratory: Denies  cough, chest tightness,  and wheezing.   Cardiovascular: Denies  palpitations and leg swelling.  Gastrointestinal: Denies nausea, vomiting, abdominal pain, diarrhea, constipation, blood in stool and abdominal distention.  Genitourinary: Denies dysuria, urgency, frequency, hematuria, flank pain and difficulty urinating.  Endocrine: Denies: hot or cold intolerance, sweats, changes in hair or nails, polyuria, polydipsia. Musculoskeletal: Denies myalgias, back pain, joint swelling, arthralgias and gait problem.  Skin: Denies pallor, rash and wound.  Neurological: Denies dizziness, seizures, syncope, light-headedness, numbness and headaches.  Hematological: Denies adenopathy. Easy bruising, personal or family bleeding history  Psychiatric/Behavioral: Denies suicidal ideation, mood changes, confusion, nervousness, sleep disturbance and agitation    Physical Exam: Vitals:   08/25/17 1130  08/25/17 1200 08/25/17 1300 08/25/17 1453  BP: (!) 158/73 (!) 157/74 139/66 (!) 160/105  Pulse: 79 65 70 90  Resp: 13 17 (!) 23 18  Temp:    98.1 F (36.7 C)  TempSrc:    Oral  SpO2: 99% 98% 98% 99%  Weight:      Height:         Constitutional: NAD, calm, comfortable Eyes: PERRL, lids  and conjunctivae normal ENMT: Mucous membranes are moist. Posterior pharynx clear of any exudate or lesions.Normal dentition.  Neck: normal, supple, no masses, no thyromegaly Respiratory: clear to auscultation bilaterally, no wheezing, no crackles. Normal respiratory effort. No accessory muscle use.  Cardiovascular: Regular rate and rhythm, no murmurs / rubs / gallops. No extremity edema. 2+ pedal pulses. No carotid bruits.  Abdomen: no tenderness, no masses palpated. No hepatosplenomegaly. Bowel sounds positive.  Musculoskeletal: no clubbing / cyanosis. No joint deformity upper and lower extremities. Good ROM, no contractures. Normal muscle tone.  Skin: no rashes, lesions, ulcers. No induration Neurologic: CN 2-12 grossly intact. Sensation intact, DTR normal. Strength 5/5 in all 4.  Psychiatric: Normal judgment and insight. Alert and oriented x 3. Normal mood.    Labs on Admission: I have personally reviewed the following labs and imaging studies  CBC: Recent Labs  Lab 08/25/17 1209  WBC 10.4  NEUTROABS 8.7*  HGB 15.0  HCT 44.7  MCV 94.9  PLT 627   Basic Metabolic Panel: Recent Labs  Lab 08/25/17 1209  NA 143  K 3.4*  CL 102  CO2 32  GLUCOSE 109*  BUN 16  CREATININE 0.71  CALCIUM 9.1   GFR: Estimated Creatinine Clearance: 50.9 mL/min (by C-G formula based on SCr of 0.71 mg/dL). Liver Function Tests: Recent Labs  Lab 08/25/17 1209  AST 16  ALT 15  ALKPHOS 79  BILITOT 1.1  PROT 6.8  ALBUMIN 3.9   No results for input(s): LIPASE, AMYLASE in the last 168 hours. No results for input(s): AMMONIA in the last 168 hours. Coagulation Profile: Recent Labs  Lab 08/25/17 1209    INR 1.61   Cardiac Enzymes: Recent Labs  Lab 08/25/17 1209  TROPONINI 0.03*   BNP (last 3 results) No results for input(s): PROBNP in the last 8760 hours. HbA1C: No results for input(s): HGBA1C in the last 72 hours. CBG: No results for input(s): GLUCAP in the last 168 hours. Lipid Profile: No results for input(s): CHOL, HDL, LDLCALC, TRIG, CHOLHDL, LDLDIRECT in the last 72 hours. Thyroid Function Tests: Recent Labs    08/25/17 1403  TSH 0.964   Anemia Panel: No results for input(s): VITAMINB12, FOLATE, FERRITIN, TIBC, IRON, RETICCTPCT in the last 72 hours. Urine analysis:    Component Value Date/Time   APPEARANCEUR Clear 04/18/2016 1008   GLUCOSEU Negative 04/18/2016 1008   BILIRUBINUR Negative 04/18/2016 1008   PROTEINUR Negative 04/18/2016 1008   UROBILINOGEN negative 11/26/2013 1238   NITRITE Negative 04/18/2016 1008   LEUKOCYTESUR Negative 04/18/2016 1008   Sepsis Labs: @LABRCNTIP (procalcitonin:4,lacticidven:4) )No results found for this or any previous visit (from the past 240 hour(s)).   Radiological Exams on Admission: Dg Chest 2 View  Result Date: 08/25/2017 CLINICAL DATA:  Chest discomfort and hypertension. Shortness of breath. EXAM: CHEST - 2 VIEW COMPARISON:  August 01, 2017 FINDINGS: There is slight atelectasis in the left base. The lungs elsewhere are clear. The heart size and pulmonary vascularity are normal. Pacemaker lead is attached to the right ventricle. No adenopathy. No evident bone lesions. IMPRESSION: No active cardiopulmonary disease. Mild left base atelectasis. No edema or consolidation. Stable cardiac silhouette. Electronically Signed   By: Lowella Grip III M.D.   On: 08/25/2017 11:38    EKG: Independently reviewed.  Atrial fibrillation with some pacer spikes and PVCs, no acute ischemic changes are apparent.  Assessment/Plan Principal Problem:   Chest pain Active Problems:   Atrial fibrillation, permanent (HCC)   GERD  (gastroesophageal reflux disease)  Benign essential HTN   Hyperlipidemia   Congestive dilated cardiomyopathy ([facility])   Moderate persistent asthma   Sjogren's syndrome ([facility])   MVP (mitral valve prolapse)   Hypothyroidism   Steroid-induced osteopenia   Generalized weakness    Chest pain -Doubt ACS, however with a heart score of 3 (on account of her age and risk factors) have agreed to admit for ACS rule out. -I strongly suspect that anxiety and social stressors are playing a large role. -No reason to suspect PE at this time.  She is very low probability by well's criteria and is already anticoagulated on Coumadin. -We will admit to telemetry, cycle troponins, repeat 2D echo.  Generalized weakness -Again suspect due to severe social stressors, nonetheless we will work-up with TSH and B12.  Hypothyroidism -Check TSH, continue Synthroid.  Sjogren's syndrome -Continue prednisone and Restasis.  Hypertension, uncontrolled -Blood pressure remains elevated, currently in the range of 160/90, have discontinued heparin drip ordered by ER and will instead start oral hypoglycemic agents.  Permanent atrial fibrillation/sick sinus syndrome -Status post permanent pacemaker. -Anticoagulated on Coumadin, pharmacy to dose.  Mitral valve prolapse -Noted  Hyperlipidemia -Statin  Dilated cardiomyopathy -Echo from 2016 shows an ejection fraction of 45%, will repeat echo. -Patient is on Cozaar, states she is unable to take carvedilol as she has had several instances of severe hypotension and bradycardia resulting in falls with significant injury including a right rotator cuff tear.  She has attempted to be on carvedilol at least 2 times.     DVT prophylaxis: Coumadin Code Status: Full code Family Communication: Patient only Disposition Plan: Home pending medical stability, likely in 24 hours Consults called: None Admission status: It is my clinical opinion that referral for OBSERVATION is  reasonable and necessary in this patient based on the above information provided. The aforementioned taken together are felt to place the patient at high risk for further clinical deterioration. However it is anticipated that the patient may be medically stable for discharge from the hospital within 24 to 48 hours.      Time Spent: 85 minutes  Estela Isaac Bliss MD [facility] Pager [phone]  If 7PM-7AM, please contact night-coverage [email or web address] Password [name]  08/25/2017, 3:22 PM

## 2017-08-25 NOTE — Progress Notes (Signed)
   Subjective:    Patient ID: Angela Wong, female    DOB: 06-16-1940, 77 y.o.   MRN: [id number]  Chief Complaint  Patient presents with  . Chest Pain    some nausea, fatigue, SOB with chest pressure x several days   Pt presents to the office today with chest pain that started this morning. States she has hx of A Fib and is currently taking warfarin. She is followed by Cardiologists and was seen in 02/19. She has a pacemaker and was seen by her Electrophysiologists 08/12/17.  Chest Pain   This is a recurrent problem. The current episode started today. The onset quality is sudden. The problem occurs constantly. The problem has been unchanged. The pain is present in the substernal region. The pain is at a severity of 3/10. The pain is mild. The quality of the pain is described as pressure. The pain radiates to the upper back. Associated symptoms include back pain, a cough, diaphoresis, exertional chest pressure, headaches, irregular heartbeat, nausea, palpitations, shortness of breath and weakness. Pertinent negatives include no dizziness, fever or vomiting. She has tried rest for the symptoms. The treatment provided no relief.  Her past medical history is significant for MI.      Review of Systems  Constitutional: Positive for diaphoresis. Negative for fever.  Respiratory: Positive for cough and shortness of breath.   Cardiovascular: Positive for chest pain and palpitations.  Gastrointestinal: Positive for nausea. Negative for vomiting.  Musculoskeletal: Positive for back pain.  Neurological: Positive for weakness and headaches. Negative for dizziness.  All other systems reviewed and are negative.      Objective:   Physical Exam  Constitutional: She is oriented to person, place, and time. She appears well-developed and well-nourished. No distress.  HENT:  Head: Normocephalic and atraumatic.  Right Ear: External ear normal.  Mouth/Throat: Oropharynx is clear and moist.  Eyes:  Pupils are equal, round, and reactive to light.  Neck: Normal range of motion. Neck supple. No thyromegaly present.  Cardiovascular: Normal rate, normal heart sounds and intact distal pulses. An irregular rhythm present.  No murmur heard. Pulmonary/Chest: Effort normal. No respiratory distress. She has wheezes.  Abdominal: Soft. Bowel sounds are normal. She exhibits no distension. There is no tenderness.  Musculoskeletal: Normal range of motion. She exhibits no edema or tenderness.  Neurological: She is alert and oriented to person, place, and time. She has normal reflexes. No cranial nerve deficit.  Skin: Skin is warm and dry.  Psychiatric: She has a normal mood and affect. Her behavior is normal. Judgment and thought content normal.  Vitals reviewed.    BP (!) 158/88 (BP Location: Left Arm, Patient Position: Sitting, Cuff Size: Normal)   Pulse 84   Temp 97.9 F (36.6 C) (Oral)      Assessment & Plan:  Angela Wong comes in today with chief complaint of Chest Pain (some nausea, fatigue, SOB with chest pressure x several days)   Diagnosis and orders addressed:  1. Other chest pain - EKG 12-Lead  2. SOB (shortness of breath)  3. Atrial fibrillation, permanent [facility])   EMS called and transported to [facility] to rule out MI [place] and purse given to son who will meet patient at [facility], [place]

## 2017-08-25 NOTE — ED Triage Notes (Signed)
C/o chest pain and elevated  BP sent in from MD office for evaluation

## 2017-08-25 NOTE — ED Provider Notes (Signed)
[facility] EMERGENCY DEPARTMENT Provider Note   CSN: [id number] Arrival date & time: 08/25/17  1029     History   Chief Complaint Chief Complaint  Patient presents with  . Chest Pain    HPI Angela Wong is a 77 y.o. female.  HPI Patient presents with chest pain and fatigue.  Sent by PCP.  Has had fatigue over the last few days.  States she thought it was due to stress and some of her other diseases.  States that she forced herself to vacuum yesterday.  Did okay with that but today developed chest tightness.  Then her mid chest and goes to the back.  Feels like a pressure.  No nausea or vomiting.  Has had some shortness of breath and cough but this is really unchanged.  Had been on antibiotics a month ago.  Does have history of COPD.  Has not had much exercise done recently and has been more fatigued.  States she has had a lot of loss.  Has a history of chronic A. fib but states her heart was more irregular today.  Patient has a history of costochondritis and chronic pain and states this feels different. Past Medical History:  Diagnosis Date  . A-fib ([place])   . Asthma   . Cerebral vasculitis   . COPD (chronic obstructive pulmonary disease) ([place])   . Fibromyalgia   . Gastric polyp   . GERD (gastroesophageal reflux disease)   . Hiatal hernia   . Hyperparathyroidism ([place])   . IBS (irritable bowel syndrome)   . MVP (mitral valve prolapse)   . Osteoarthritis   . Ovarian cancer ([place])    lymph node removal with hysterectomy  . Raynaud's disease   . RLS (restless legs syndrome)   . Situational depression   . Sjogren's syndrome ([place])   . Vasculitis ([place])   . Vitamin D deficiency     Patient Active Problem List   Diagnosis Date Noted  . Seasonal and perennial allergic rhinitis 12/31/2016  . Allergic rhinitis with a nonallergic component 10/01/2016  . Pruritus 10/01/2016  . 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 Steroid-induced osteopenia 07/11/2016  . Hyperparathyroidism ([place])  01/16/2016  . Iron deficiency anemia 12/18/2015  . Insomnia 12/18/2015  . Hypothyroidism 12/18/2015  . Rotator cuff tear 03/23/2014  . Osteoarthritis   . Moderate persistent asthma   . Ovarian cancer ([place])   . Fibromyalgia   . RLS (restless legs syndrome)   . Cerebral vasculitis   . Sjogren's syndrome ([place])   . Raynaud's disease   . MVP (mitral valve prolapse)   . IBS (irritable bowel syndrome)   . Congestive dilated cardiomyopathy ([place]) 02/09/2014  . Dyspnea 12/13/2013  . Hyperlipidemia 11/26/2013  . Malaise and fatigue 11/26/2013  . Multiple drug allergies 11/02/2013  . History of ovarian cancer 10/01/2013  . GERD (gastroesophageal reflux disease) 10/01/2013  . Benign essential HTN 10/01/2013  . Atrial fibrillation, permanent ([place]) 07/27/2013    Past Surgical History:  Procedure Laterality Date  . ABDOMINAL HYSTERECTOMY  1982   with right oophorectomy  . APPENDECTOMY    . BREAST BIOPSY Right    x 2  . BREAST SURGERY     Biopsy  . CARPAL TUNNEL RELEASE Right 1980  . CARPAL TUNNEL RELEASE Left 2010   x 2  . CATARACT EXTRACTION Bilateral   . CESAREAN SECTION     x 3  . KNEE SURGERY Left 2005  . OOPHORECTOMY Left 1962  . PACEMAKER INSERTION  09/15/2014  . REFRACTIVE SURGERY Bilateral 2014  . TUBAL LIGATION       OB History   None      Home Medications    Prior to Admission medications   Medication Sig Start Date End Date Taking? Authorizing Provider  albuterol (PROVENTIL HFA;VENTOLIN HFA) 108 (90 Base) MCG/ACT inhaler Inhale 2 puffs into the lungs every 6 (six) hours as needed for wheezing. 10/01/16  Yes Bobbitt, [place] Muta, MD  atenolol (TENORMIN) 25 MG tablet Take 12.5 mg by mouth daily. 03/11/16  Yes [provider]  azelastine (ASTELIN) 0.1 % nasal spray Place 2 sprays into both nostrils 2 (two) times daily. 01/21/17  Yes Bobbitt, [place] Muta, MD  B Complex-C (B-COMPLEX WITH VITAMIN C) tablet Take 1 tablet by mouth daily.   Yes [provider]  budesonide-formoterol (SYMBICORT) 160-4.5 MCG/ACT inhaler Inhale 2 puffs into the lungs 2 (two) times daily. 05/13/17  Yes Hawks, Christy A, FNP  bumetanide (BUMEX) 0.5 MG tablet Take 1 tablet (0.5 mg total) by mouth every other day. 12/18/16  Yes Stacks, Cletus Gash, MD  bumetanide (BUMEX) 1 MG tablet Take 1 tablet (1 mg total) by mouth every other day. On opposite day of 0.5mg  of bumetadnide 12/18/16  Yes Stacks, Cletus Gash, MD  Cholecalciferol (VITAMIN D) 2000 UNITS CAPS Take 2 capsules by mouth every evening.    Yes [provider]  Cyanocobalamin (VITAMIN B 12 PO) Take 500 mg by mouth daily.   Yes [provider]  cyclobenzaprine (FLEXERIL) 5 MG tablet Take 0.5 tablets (2.5 mg total) by mouth 2 (two) times daily as needed for muscle spasms. 08/01/17  Yes Eustaquio Maize, MD  cycloSPORINE (RESTASIS) 0.05 % ophthalmic emulsion Place 1 drop into both eyes 2 (two) times daily. 11/14/16  Yes Hawks, Christy A, FNP  docusate sodium (COLACE) 100 MG capsule Take 400 mg by mouth daily.    Yes [provider]  esomeprazole (NEXIUM) 40 MG capsule Take 1 capsule (40 mg total) by mouth 2 (two) times daily before a meal. 04/04/17  Yes Pyrtle, Lajuan Lines, MD  fexofenadine (ALLEGRA) 180 MG tablet Take 180 mg by mouth every morning.    Yes [provider]  Iron Polysacch Cmplx-B12-FA (POLY-IRON 150 FORTE) 150-0.025-1 MG CAPS Take 1 tablet by mouth daily. 03/07/17  Yes Memory Argue, PharmD  levothyroxine (SYNTHROID, LEVOTHROID) 50 MCG tablet Take 100 mcg by mouth daily. 06/25/17  Yes [provider]  losartan (COZAAR) 100 MG tablet TAKE 1 TABLET BY MOUTH ONCE DAILY (NEEDS  APPOINTMENT) 07/08/17  Yes Hawks, Christy A, FNP  Magnesium 200 MG TABS Take 1 tablet by mouth daily.   Yes [provider]  metroNIDAZOLE (METROGEL) 1 % gel Apply topically daily. 10/07/16  Yes Hawks, Christy A, FNP  montelukast (SINGULAIR) 10 MG tablet TAKE 1 TABLET BY MOUTH ONCE DAILY AT  BEDTIME 06/10/17  Yes Hawks, Christy A, FNP  Olopatadine HCl 0.2 % SOLN Apply 1 drop to eye daily. 11/14/16  Yes Hawks, Christy A, FNP  predniSONE (DELTASONE) 5 MG tablet TAKE 1 TABLET BY MOUTH ONCE DAILY 06/10/17  Yes Hawks, Christy A, FNP  ranitidine (ZANTAC) 150 MG tablet Take 1 tablet (150 mg total) by mouth at bedtime. 06/16/17  Yes Pyrtle, Lajuan Lines, MD  Simethicone (GAS-X EXTRA STRENGTH) 125 MG CAPS Take 2 capsules by mouth daily as needed (for relief).    Yes [provider]  traZODone (DESYREL) 150 MG tablet TAKE 1 TABLET BY MOUTH AT BEDTIME 07/08/17  Yes Hawks, Christy A, FNP  warfarin (COUMADIN) 1 MG tablet TAKE 2 TO 4 TABLETS (= 2 TO 4MG ) BY MOUTH ONCE DAILY AS DIRECTED BY ANTICOGULATION CLINIC. 03/07/17  Yes Memory Argue, PharmD  azithromycin (ZITHROMAX) 250 MG tablet azithromycin 250 mg tablet  TAKE 2 TABLETS BY MOUTH TODAY, THEN TAKE 1 TABLET DAILY FOR 4 DAYS    [provider]  clindamycin (CLEOCIN) 150 MG capsule TAKE FOUR CAPSULES BY MOUTH ONE HOUR BEFORE DENTAL APPOINTMENT 08/19/17   [provider]  fluticasone (FLONASE) 50 MCG/ACT nasal spray Place 2 sprays into both nostrils daily. 08/15/17   Hawks, Christy A, FNP  PREVIDENT 5000 DRY MOUTH 1.1 % GEL dental gel Place 1 application onto teeth as needed (as needed for dry mouth).  10/13/13   [provider]    Family History Family History  Problem Relation Age of Onset  . COPD Mother   . Breast cancer Mother   . Allergic rhinitis Mother   . Heart disease Father        No details  . Kidney disease Father   . Allergic rhinitis Father   . Stroke Sister   . Arthritis/Rheumatoid Sister   . Asthma Sister   . Lupus Sister   . Heart attack Sister   . Stroke Paternal Grandmother   . Scleroderma Grandchild   . Thyroid disease Other   . Breast cancer Maternal Aunt        x 2    Social History Social History   Tobacco Use  . Smoking status: Never Smoker  . Smokeless tobacco: Never Used    Substance Use Topics  . Alcohol use: No  . Drug use: No     Allergies   Cephalosporins; Ciprofloxacin; Doxycycline; Horse-derived products; Ketek [telithromycin]; Nitrofuran derivatives; Nitrous oxide; Other; Penicillins; Pentazocine; Sulfa antibiotics; Trovan [alatrofloxacin]; Amlodipine; Calcium channel blockers; Carvedilol; Codeine; Cymbalta [duloxetine hcl]; Diltiazem; Diovan [valsartan]; Lisinopril; Sertraline; Tramadol; Zoloft [sertraline hcl]; and Loteprednol etabonate   Review of Systems Review of Systems  Constitutional: Positive for fatigue. Negative for fever.  Respiratory: Positive for cough.   Cardiovascular: Positive for chest pain.  Gastrointestinal: Negative for abdominal pain.  Genitourinary: Negative for flank pain.  Musculoskeletal: Negative for back pain.  Skin: Negative for rash and wound.  Neurological: Negative for weakness.  Hematological: Negative for adenopathy.  Psychiatric/Behavioral: Negative for confusion.     Physical Exam Updated Vital Signs BP (!) 194/105 (BP Location: Left Arm)   Pulse 90   Temp (!) 97.5 F (36.4 C) (Oral)   Resp 15   Ht 5\' 4"  (1.626 m)   Wt 61.7 kg   SpO2 97%   BMI 23.34 kg/m   Physical Exam  Constitutional: She appears well-developed.  HENT:  Head: Normocephalic.  Eyes: EOM are normal.  Cardiovascular: Normal rate.  Pulmonary/Chest:  Rare scattered wheeze.  Pacemaker right chest wall.  Musculoskeletal:       Right lower leg: She exhibits edema.       Left lower leg: She exhibits edema.  Mild edema bilateral lower extremities.  Neurological: She is alert.  Skin: Skin is warm. Capillary refill takes less than 2 seconds.     ED Treatments / Results  Labs (all labs ordered are listed, but only abnormal results are displayed) Labs Reviewed  COMPREHENSIVE METABOLIC PANEL - Abnormal; Notable for the following components:      Result Value   Potassium 3.4 (*)    Glucose, Bld 109 (*)    All other  components  within normal limits  TROPONIN I - Abnormal; Notable for the following components:   Troponin I 0.03 (*)    All other components within normal limits  CBC WITH DIFFERENTIAL/PLATELET - Abnormal; Notable for the following components:   Neutro Abs 8.7 (*)    All other components within normal limits  PROTIME-INR - Abnormal; Notable for the following components:   Prothrombin Time 19.0 (*)    All other components within normal limits    EKG EKG Interpretation  Date/Time:  Monday August 25 2017 10:38:08 EDT Ventricular Rate:  91 PR Interval:    QRS Duration: 97 QT Interval:  366 QTC Calculation: 423 R Axis:   98 Text Interpretation:  Atrial fibrillation Multiple ventricular premature complexes Right axis deviation Low voltage, precordial leads Minimal ST depression, lateral leads Confirmed by Davonna Belling [phone]) on 08/25/2017 10:59:50 AM   Radiology Dg Chest 2 View  Result Date: 08/25/2017 CLINICAL DATA:  Chest discomfort and hypertension. Shortness of breath. EXAM: CHEST - 2 VIEW COMPARISON:  August 01, 2017 FINDINGS: There is slight atelectasis in the left base. The lungs elsewhere are clear. The heart size and pulmonary vascularity are normal. Pacemaker lead is attached to the right ventricle. No adenopathy. No evident bone lesions. IMPRESSION: No active cardiopulmonary disease. Mild left base atelectasis. No edema or consolidation. Stable cardiac silhouette. Electronically Signed   By: Lowella Grip III M.D.   On: 08/25/2017 11:38    Procedures Procedures (including critical care time)  Medications Ordered in ED Medications - No data to display   Initial Impression / Assessment and Plan / ED Course  I have reviewed the triage vital signs and the nursing notes.  Pertinent labs & imaging results that were available during my care of the patient were reviewed by me and considered in my medical decision making (see chart for details).     Patient sent in from PCP.   Elevated blood pressure and dull chest pain.  Some pressure in the mid chest.  EKG reassuring but troponin minimally elevated 0.03.  With hypertension may need to start nitro drip.  On Coumadin for A. fib but INR is subtherapeutic.  Will admit to hospitalist.  Final Clinical Impressions(s) / ED Diagnoses   Final diagnoses:  Chest pain, unspecified type    ED Discharge Orders    None       Davonna Belling, MD 08/25/17 1254

## 2017-08-26 ENCOUNTER — Observation Stay (HOSPITAL_BASED_OUTPATIENT_CLINIC_OR_DEPARTMENT_OTHER): Payer: Medicare Other

## 2017-08-26 DIAGNOSIS — I420 Dilated cardiomyopathy: Secondary | ICD-10-CM | POA: Diagnosis not present

## 2017-08-26 DIAGNOSIS — I10 Essential (primary) hypertension: Secondary | ICD-10-CM

## 2017-08-26 DIAGNOSIS — I340 Nonrheumatic mitral (valve) insufficiency: Secondary | ICD-10-CM | POA: Diagnosis not present

## 2017-08-26 DIAGNOSIS — R079 Chest pain, unspecified: Secondary | ICD-10-CM | POA: Diagnosis not present

## 2017-08-26 DIAGNOSIS — I482 Chronic atrial fibrillation: Secondary | ICD-10-CM | POA: Diagnosis not present

## 2017-08-26 LAB — TROPONIN I
Troponin I: <0.03 ng/mL (ref <0.03)
Troponin I: <0.03 ng/mL (ref <0.03)

## 2017-08-26 LAB — PROTIME-INR
INR: 1.51
Prothrombin Time: 18 seconds — ABNORMAL HIGH (ref 11.4–15.2)

## 2017-08-26 LAB — ECHOCARDIOGRAM COMPLETE
HEIGHTINCHES: 64 in
Weight: 2176 oz

## 2017-08-26 MED ORDER — ASPIRIN EC 81 MG PO TBEC: 81.0000 mg | DELAYED_RELEASE_TABLET | Freq: Every day | ORAL | Status: DC

## 2017-08-26 MED ORDER — ASPIRIN 81 MG PO TBEC: 81.0000 mg | DELAYED_RELEASE_TABLET | Freq: Every day | ORAL | Status: DC

## 2017-08-26 MED ORDER — WARFARIN SODIUM 2 MG PO TABS: 4.0000 mg | ORAL_TABLET | Freq: Once | ORAL | Status: DC

## 2017-08-26 NOTE — Progress Notes (Signed)
*  PRELIMINARY RESULTS* Echocardiogram 2D Echocardiogram has been performed.  Leavy Cella 08/26/2017, 11:30 AM

## 2017-08-26 NOTE — Care Management Obs Status (Signed)
[facility] NOTIFICATION   Patient Details  Name: Angela Wong MRN: [id number] Date of Birth: 1940-02-11   Medicare Observation Status Notification Given:  Yes    Sherald Barge, RN 08/26/2017, 9:11 AM

## 2017-08-26 NOTE — Progress Notes (Signed)
ANTICOAGULATION CONSULT NOTE - follow up Angela Wong for warfarin Indication: atrial fibrillation  Allergies  Allergen Reactions  . Cephalosporins Hives and Shortness Of Breath  . Ciprofloxacin Hives and Shortness Of Breath  . Doxycycline Hives and Shortness Of Breath  . Horse-Derived Products Anaphylaxis  . Ketek [Telithromycin] Palpitations    Chest discomfort  . Nitrofuran Derivatives Anaphylaxis and Hives    blisters  . Nitrous Oxide Nausea And Vomiting    Severe due to Sjogrens (Auto-Immune Disease)  . Other Anaphylaxis    ALLERGY TO HORSE SERUM  . Penicillins Hives and Shortness Of Breath  . Pentazocine Other (See Comments)    Other reaction(s): Mental Status Changes (intolerance) Altered Mental Status  Altered Mental Status  Altered Mental Status   . Sulfa Antibiotics Hives and Shortness Of Breath  . Trovan [Alatrofloxacin] Palpitations, Other (See Comments) and Anaphylaxis    Chest pain, dizziness, irregular pulse  . Amlodipine Swelling  . Calcium Channel Blockers     Respiratory distress  . Carvedilol Other (See Comments)    Dizziness, "joint pain, depression"  . Codeine Nausea Only  . Cymbalta [Duloxetine Hcl] Swelling  . Diltiazem     Swollen throat   . Diovan [Valsartan] Swelling  . Lisinopril Swelling  . Sertraline   . Tramadol Nausea Only  . Zoloft [Sertraline Hcl]   . Loteprednol Etabonate Rash    Patient Measurements: Height: 5\' 4"  (162.6 cm) Weight: 136 lb (61.7 kg) IBW/kg (Calculated) : 54.7   Vital Signs: Temp: 98.1 F (36.7 C) (08/13 0556) Temp Source: Oral (08/13 0556) BP: 143/73 (08/13 0556) Pulse Rate: 60 (08/13 0556)  Labs: Recent Labs    08/25/17 1209 08/25/17 2144 08/26/17 0330 08/26/17 0926  HGB 15.0  --   --   --   HCT 44.7  --   --   --   PLT 190  --   --   --   LABPROT 19.0*  --  18.0*  --   INR 1.61  --  1.51  --   CREATININE 0.71  --   --   --   TROPONINI 0.03* <0.03 <0.03 <0.03    Estimated  Creatinine Clearance: 50.9 mL/min (by C-G formula based on SCr of 0.71 mg/dL).   Medical History: Past Medical History:  Diagnosis Date  . A-fib ([place])   . Asthma   . Cerebral vasculitis   . COPD (chronic obstructive pulmonary disease) ([place])   . Fibromyalgia   . Gastric polyp   . GERD (gastroesophageal reflux disease)   . Hiatal hernia   . Hyperparathyroidism ([place])   . IBS (irritable bowel syndrome)   . MVP (mitral valve prolapse)   . Osteoarthritis   . Ovarian cancer ([place])    lymph node removal with hysterectomy  . Raynaud's disease   . RLS (restless legs syndrome)   . Situational depression   . Sjogren's syndrome ([place])   . Vasculitis ([place])   . Vitamin D deficiency     Medications:  Medications Prior to Admission  Medication Sig Dispense Refill Last Dose  . albuterol (PROVENTIL HFA;VENTOLIN HFA) 108 (90 Base) MCG/ACT inhaler Inhale 2 puffs into the lungs every 6 (six) hours as needed for wheezing. 1 Inhaler 2 08/24/2017 at Unknown time  . atenolol (TENORMIN) 25 MG tablet Take 12.5 mg by mouth daily.  6 08/25/2017 at Unknown time  . azelastine (ASTELIN) 0.1 % nasal spray Place 2 sprays into both nostrils 2 (two) times daily. 90 mL 1  Past Month at Unknown time  . B Complex-C (B-COMPLEX WITH VITAMIN C) tablet Take 1 tablet by mouth daily.   08/24/2017 at Unknown time  . B Complex-C (B-COMPLEX WITH VITAMIN C) tablet Take 1 tablet by mouth daily.   08/25/2017 at Unknown time  . budesonide-formoterol (SYMBICORT) 160-4.5 MCG/ACT inhaler Inhale 2 puffs into the lungs 2 (two) times daily. 3 Inhaler 3 08/24/2017 at Unknown time  . bumetanide (BUMEX) 0.5 MG tablet Take 1 tablet (0.5 mg total) by mouth every other day. 45 tablet 1 08/24/2017 at Unknown time  . bumetanide (BUMEX) 1 MG tablet Take 1 tablet (1 mg total) by mouth every other day. On opposite day of 0.5mg  of bumetadnide 45 tablet 1 08/25/2017 at Unknown time  . Cholecalciferol (VITAMIN D) 2000 UNITS CAPS Take 2 capsules by mouth every  evening.    08/24/2017 at Unknown time  . Cyanocobalamin (VITAMIN B 12 PO) Take 500 mg by mouth daily.   08/24/2017 at Unknown time  . cyclobenzaprine (FLEXERIL) 5 MG tablet Take 0.5 tablets (2.5 mg total) by mouth 2 (two) times daily as needed for muscle spasms. 10 tablet 0 Past Week at Unknown time  . cycloSPORINE (RESTASIS) 0.05 % ophthalmic emulsion Place 1 drop into both eyes 2 (two) times daily. 60 mL 2 08/24/2017 at Unknown time  . docusate sodium (COLACE) 100 MG capsule Take 400 mg by mouth daily.    08/25/2017 at Unknown time  . esomeprazole (NEXIUM) 40 MG capsule Take 1 capsule (40 mg total) by mouth 2 (two) times daily before a meal. 180 capsule 3 08/25/2017 at Unknown time  . fexofenadine (ALLEGRA) 180 MG tablet Take 180 mg by mouth every morning.    08/25/2017 at Unknown time  . Iron Polysacch Cmplx-B12-FA (POLY-IRON 150 FORTE) 150-0.025-1 MG CAPS Take 1 tablet by mouth daily. 90 each 3 08/24/2017 at Unknown time  . levothyroxine (SYNTHROID, LEVOTHROID) 50 MCG tablet Take 100 mcg by mouth daily.  0 08/25/2017 at Unknown time  . losartan (COZAAR) 100 MG tablet TAKE 1 TABLET BY MOUTH ONCE DAILY (NEEDS  APPOINTMENT) 90 tablet 0 08/25/2017 at Unknown time  . Magnesium 200 MG TABS Take 1 tablet by mouth daily.   08/24/2017 at Unknown time  . metroNIDAZOLE (METROGEL) 1 % gel Apply topically daily. 45 g 0 08/24/2017 at Unknown time  . montelukast (SINGULAIR) 10 MG tablet TAKE 1 TABLET BY MOUTH ONCE DAILY AT BEDTIME 90 tablet 1 08/24/2017 at Unknown time  . Olopatadine HCl 0.2 % SOLN Apply 1 drop to eye daily. 2.5 mL 2 08/24/2017 at Unknown time  . predniSONE (DELTASONE) 5 MG tablet TAKE 1 TABLET BY MOUTH ONCE DAILY 90 tablet 0 08/25/2017 at Unknown time  . ranitidine (ZANTAC) 150 MG tablet Take 1 tablet (150 mg total) by mouth at bedtime. 90 tablet 1 08/24/2017 at Unknown time  . Simethicone (GAS-X EXTRA STRENGTH) 125 MG CAPS Take 2 capsules by mouth daily as needed (for relief).    08/24/2017 at Unknown time   . traZODone (DESYREL) 150 MG tablet TAKE 1 TABLET BY MOUTH AT BEDTIME 90 tablet 0 08/24/2017 at Unknown time  . warfarin (COUMADIN) 1 MG tablet TAKE 2 TO 4 TABLETS (= 2 TO 4MG ) BY MOUTH ONCE DAILY AS DIRECTED BY ANTICOGULATION CLINIC. 350 tablet 3 08/24/2017 at 2130  . azithromycin (ZITHROMAX) 250 MG tablet azithromycin 250 mg tablet  TAKE 2 TABLETS BY MOUTH TODAY, THEN TAKE 1 TABLET DAILY FOR 4 DAYS   Completed Course at Unknown  time  . clindamycin (CLEOCIN) 150 MG capsule TAKE FOUR CAPSULES BY MOUTH ONE HOUR BEFORE DENTAL APPOINTMENT  1   . fluticasone (FLONASE) 50 MCG/ACT nasal spray Place 2 sprays into both nostrils daily. 16 g 6 Taking  . PREVIDENT 5000 DRY MOUTH 1.1 % GEL dental gel Place 1 application onto teeth as needed (as needed for dry mouth).    unknown    Assessment: Pharmacy consulted to dose warfarin in patient with atrial fibrillation. INR on admission is subtherapeutic and still low at 1.5 Patient's home dose of warfarin is 4 mg MW and 2 mg ROW.  Goal of Therapy:  INR 2-3 Monitor platelets by anticoagulation protocol: Yes   Plan:  Warfarin 4 mg x 1 dose today Monitor daily INR and s/s of bleeding  Isac Sarna, BS [place]ena [place], BCPS Clinical Pharmacist Pager [phone] 08/26/2017 10:00 AM

## 2017-08-26 NOTE — Progress Notes (Signed)
Removed IV-clean, dry, intact. Reviewed d/c paperwork with patient. Reviewed medication changes. Answered all questions. Wheeled stable patient to main entrance where she was picked up by her son.

## 2017-08-26 NOTE — Discharge Summary (Signed)
Physician Discharge Summary  Angela Wong IZT:[id number] DOB: 20-Sep-1940 DOA: 08/25/2017  PCP: [name], FNP  Admit date: 08/25/2017 Discharge date: 08/26/2017  Time spent: 45 minutes  Recommendations for Outpatient Follow-up:  -Will be discharged home today. -Advised to follow up with PCP in 2 weeks.   Discharge Diagnoses:  Principal Problem:   Chest pain Active Problems:   Atrial fibrillation, permanent (HCC)   GERD (gastroesophageal reflux disease)   Benign essential HTN   Hyperlipidemia   Congestive dilated cardiomyopathy (HCC)   Moderate persistent asthma   Sjogren's syndrome (HCC)   MVP (mitral valve prolapse)   Hypothyroidism   Steroid-induced osteopenia   Generalized weakness   Discharge Condition: Stable and improved  Filed Weights   08/25/17 1042  Weight: 61.7 kg    History of present illness:  Angela Wong is a 77 y.o. female with multiple medical comorbidities including atrial fibrillation, sick sinus syndrome status post permanent pacemaker, COPD, hypothyroidism, mitral valve prolapse, restless leg syndrome, Sjogren's syndrome on chronic prednisone as well as dilated cardiomyopathy who presents to the hospital today with chest pain and weakness.  She has been under a lot of social stressors with the death of 2 close family members and her dog all within the past 2 months.  She states she has been getting progressively weak.  However last night in the afternoon she started experiencing some substernal chest discomfort that she describes as a heaviness in her chest, no radiation, associated with some mild dyspnea on exertion but no palpitations or dizziness.  This morning, because her pain had not resolved, she went to visit her primary care physician later referred her to the hospital for evaluation.  She was initially found to be markedly hypertensive with a blood pressure of 190/100, labs are essentially unremarkable except for a slightly low  potassium at 3.5 and a slightly elevated troponin at 0.03.  Admission was requested for chest pain evaluation.  Hospital Course:   Chest Pain -Ruled out for ACS with negative troponins and EKG without acute ischemic changes. -Low probability by Well's criteria for PE. She is also chronically anticoagulated on coumadin. -2D Echo: EF 50%, diffuse hypokineses, not able to assess LV diastolic dysfunction due to a fib. -No further cardiac work up is indicated at this time.  Generalized weakness -Suspect due to severe social stressors. -TSH/B12 are WNL.  Hypothyroidism -Continue Synthroid.  Sjogren's syndrome -Continue prednisone and Restasis.  Hypertension, uncontrolled -Much improved BP control on re initiation of her home meds. -SBP 120-140 on DC. -Suspect social stressors are playing a role in her HTN as well.  Permanent atrial fibrillation/sick sinus syndrome -Status post permanent pacemaker. -Anticoagulated on Coumadin, pharmacy to dose.  Mitral valve prolapse -Noted  Hyperlipidemia -Statin  Dilated cardiomyopathy -Echo from 2016 shows an ejection fraction of 45%, repeat ECHO as above. -Patient is on Cozaar, states she is unable to take carvedilol as she has had several instances of severe hypotension and bradycardia resulting in falls with significant injury including a right rotator cuff tear.  She has attempted to be on carvedilol at least 2 times.    Procedures:  ECHO as above   Consultations:  None  Discharge Instructions  Discharge Instructions    Diet - low sodium heart healthy   Complete by:  As directed    Increase activity slowly   Complete by:  As directed      Allergies as of 08/26/2017      Reactions  Cephalosporins Hives, Shortness Of Breath   Ciprofloxacin Hives, Shortness Of Breath   Doxycycline Hives, Shortness Of Breath   Horse-derived Products Anaphylaxis   Ketek [telithromycin] Palpitations   Chest discomfort   Nitrofuran  Derivatives Anaphylaxis, Hives   blisters   Nitrous Oxide Nausea And Vomiting   Severe due to Sjogrens (Auto-Immune Disease)   Other Anaphylaxis   ALLERGY TO HORSE SERUM   Penicillins Hives, Shortness Of Breath   Pentazocine Other (See Comments)   Other reaction(s): Mental Status Changes (intolerance) Altered Mental Status  Altered Mental Status  Altered Mental Status    Sulfa Antibiotics Hives, Shortness Of Breath   Trovan [alatrofloxacin] Palpitations, Other (See Comments), Anaphylaxis   Chest pain, dizziness, irregular pulse   Amlodipine Swelling   Calcium Channel Blockers    Respiratory distress   Carvedilol Other (See Comments)   Dizziness, "joint pain, depression"   Codeine Nausea Only   Cymbalta [duloxetine Hcl] Swelling   Diltiazem    Swollen throat   Diovan [valsartan] Swelling   Lisinopril Swelling   Sertraline    Tramadol Nausea Only   Zoloft [sertraline Hcl]    Loteprednol Etabonate Rash      Medication List    STOP taking these medications   azithromycin 250 MG tablet Commonly known as:  ZITHROMAX   clindamycin 150 MG capsule Commonly known as:  CLEOCIN     TAKE these medications   albuterol 108 (90 Base) MCG/ACT inhaler Commonly known as:  PROVENTIL HFA;VENTOLIN HFA Inhale 2 puffs into the lungs every 6 (six) hours as needed for wheezing.   aspirin 81 MG EC tablet Take 1 tablet (81 mg total) by mouth daily. Start taking on:  08/27/2017   atenolol 25 MG tablet Commonly known as:  TENORMIN Take 12.5 mg by mouth daily.   azelastine 0.1 % nasal spray Commonly known as:  ASTELIN Place 2 sprays into both nostrils 2 (two) times daily.   B-complex with vitamin C tablet Take 1 tablet by mouth daily.   B-complex with vitamin C tablet Take 1 tablet by mouth daily.   budesonide-formoterol 160-4.5 MCG/ACT inhaler Commonly known as:  SYMBICORT Inhale 2 puffs into the lungs 2 (two) times daily.   bumetanide 0.5 MG tablet Commonly known as:   BUMEX Take 1 tablet (0.5 mg total) by mouth every other day.   bumetanide 1 MG tablet Commonly known as:  BUMEX Take 1 tablet (1 mg total) by mouth every other day. On opposite day of 0.5mg  of bumetadnide   cyclobenzaprine 5 MG tablet Commonly known as:  FLEXERIL Take 0.5 tablets (2.5 mg total) by mouth 2 (two) times daily as needed for muscle spasms.   cycloSPORINE 0.05 % ophthalmic emulsion Commonly known as:  RESTASIS Place 1 drop into both eyes 2 (two) times daily.   docusate sodium 100 MG capsule Commonly known as:  COLACE Take 400 mg by mouth daily.   esomeprazole 40 MG capsule Commonly known as:  NEXIUM Take 1 capsule (40 mg total) by mouth 2 (two) times daily before a meal.   fexofenadine 180 MG tablet Commonly known as:  ALLEGRA Take 180 mg by mouth every morning.   fluticasone 50 MCG/ACT nasal spray Commonly known as:  FLONASE Place 2 sprays into both nostrils daily.   GAS-X EXTRA STRENGTH 125 MG Caps Generic drug:  Simethicone Take 2 capsules by mouth daily as needed (for relief).   Iron Polysacch Cmplx-B12-FA 150-0.025-1 MG Caps Take 1 tablet by mouth daily.   levothyroxine  50 MCG tablet Commonly known as:  SYNTHROID, LEVOTHROID Take 100 mcg by mouth daily.   losartan 100 MG tablet Commonly known as:  COZAAR TAKE 1 TABLET BY MOUTH ONCE DAILY (NEEDS  APPOINTMENT)   Magnesium 200 MG Tabs Take 1 tablet by mouth daily.   metroNIDAZOLE 1 % gel Commonly known as:  METROGEL Apply topically daily.   montelukast 10 MG tablet Commonly known as:  SINGULAIR TAKE 1 TABLET BY MOUTH ONCE DAILY AT BEDTIME   Olopatadine HCl 0.2 % Soln Apply 1 drop to eye daily.   predniSONE 5 MG tablet Commonly known as:  DELTASONE TAKE 1 TABLET BY MOUTH ONCE DAILY   PREVIDENT 5000 DRY MOUTH 1.1 % Gel dental gel Generic drug:  sodium fluoride Place 1 application onto teeth as needed (as needed for dry mouth).   ranitidine 150 MG tablet Commonly known as:  ZANTAC Take 1  tablet (150 mg total) by mouth at bedtime.   traZODone 150 MG tablet Commonly known as:  DESYREL TAKE 1 TABLET BY MOUTH AT BEDTIME   VITAMIN B 12 PO Take 500 mg by mouth daily.   Vitamin D 2000 units Caps Take 2 capsules by mouth every evening.   warfarin 1 MG tablet Commonly known as:  COUMADIN Take as directed. If you are unsure how to take this medication, talk to your nurse or doctor. Original instructions:  TAKE 2 TO 4 TABLETS (= 2 TO 4MG ) BY MOUTH ONCE DAILY AS DIRECTED BY ANTICOGULATION CLINIC.      Allergies  Allergen Reactions  . Cephalosporins Hives and Shortness Of Breath  . Ciprofloxacin Hives and Shortness Of Breath  . Doxycycline Hives and Shortness Of Breath  . Horse-Derived Products Anaphylaxis  . Ketek [Telithromycin] Palpitations    Chest discomfort  . Nitrofuran Derivatives Anaphylaxis and Hives    blisters  . Nitrous Oxide Nausea And Vomiting    Severe due to Sjogrens (Auto-Immune Disease)  . Other Anaphylaxis    ALLERGY TO HORSE SERUM  . Penicillins Hives and Shortness Of Breath  . Pentazocine Other (See Comments)    Other reaction(s): Mental Status Changes (intolerance) Altered Mental Status  Altered Mental Status  Altered Mental Status   . Sulfa Antibiotics Hives and Shortness Of Breath  . Trovan [Alatrofloxacin] Palpitations, Other (See Comments) and Anaphylaxis    Chest pain, dizziness, irregular pulse  . Amlodipine Swelling  . Calcium Channel Blockers     Respiratory distress  . Carvedilol Other (See Comments)    Dizziness, "joint pain, depression"  . Codeine Nausea Only  . Cymbalta [Duloxetine Hcl] Swelling  . Diltiazem     Swollen throat   . Diovan [Valsartan] Swelling  . Lisinopril Swelling  . Sertraline   . Tramadol Nausea Only  . Zoloft [Sertraline Hcl]   . Loteprednol Etabonate Rash   Follow-up Information    [name], FNP. Schedule an appointment as soon as possible for a visit in 2 week(s).   Specialty:  Family  Medicine Contact information: [place] [address] [phone]            The results of significant diagnostics from this hospitalization (including imaging, microbiology, ancillary and laboratory) are listed below for reference.    Significant Diagnostic Studies: Dg Chest 2 View  Result Date: 08/25/2017 CLINICAL DATA:  Chest discomfort and hypertension. Shortness of breath. EXAM: CHEST - 2 VIEW COMPARISON:  August 01, 2017 FINDINGS: There is slight atelectasis in the left base. The lungs elsewhere  are clear. The heart size and pulmonary vascularity are normal. Pacemaker lead is attached to the right ventricle. No adenopathy. No evident bone lesions. IMPRESSION: No active cardiopulmonary disease. Mild left base atelectasis. No edema or consolidation. Stable cardiac silhouette. Electronically Signed   By: Lowella Grip III M.D.   On: 08/25/2017 11:38   Dg Chest 2 View  Result Date: 08/01/2017 CLINICAL DATA:  Fall.  Rib pain.  Point tenderness. EXAM: CHEST - 2 VIEW COMPARISON:  02/03/2014 chest radiograph. FINDINGS: Single lead right subclavian pacemaker with lead tip overlying the right ventricle. Stable cardiomediastinal silhouette with mild cardiomegaly. No pneumothorax. No pleural effusion. Lungs appear clear, with no acute consolidative airspace disease and no pulmonary edema. No displaced fractures in the visualized chest. IMPRESSION: Stable mild cardiomegaly without pulmonary edema. No active pulmonary disease. No displaced fractures in the visualized chest. Dedicated rib radiographs may be obtained as clinically warranted. Electronically Signed   By: Ilona Sorrel M.D.   On: 08/01/2017 17:07   Dg Thoracic Spine 2 View  Result Date: 08/01/2017 CLINICAL DATA:  [place] several days ago, low back pain, rib pain, point tenderness EXAM: THORACIC SPINE 2 VIEWS COMPARISON:  Chest radiographs 02/03/2014 FINDINGS: Osseous demineralization. Twelve pairs of ribs. Vertebral  body heights maintained without fracture or subluxation. RIGHT subclavian transvenous pacemaker lead noted. Visualized posterior ribs unremarkable. IMPRESSION: Osseous demineralization without acute thoracic spine abnormalities. Electronically Signed   By: Lavonia Dana M.D.   On: 08/01/2017 17:36   Dg Lumbar Spine 2-3 Views  Result Date: 08/01/2017 CLINICAL DATA:  [place] several days ago, low back pain, rib pain, point tenderness EXAM: LUMBAR SPINE - 2-3 VIEW COMPARISON:  Abdominal radiograph 01/15/2017 FINDINGS: Osseous demineralization. Five non-rib-bearing lumbar vertebra. Vertebral body heights maintained without fracture or subluxation. Mild dextroconvex thoracolumbar scoliosis. SI joints preserved. Numerous rounded calcifications in pelvis unchanged likely phleboliths. IMPRESSION: Osseous demineralization with mild dextroconvex thoracolumbar scoliosis. No acute abnormalities. Electronically Signed   By: Lavonia Dana M.D.   On: 08/01/2017 17:37    Microbiology: No results found for this or any previous visit (from the past 240 hour(s)).   Labs: Basic Metabolic Panel: Recent Labs  Lab 08/25/17 1209  NA 143  K 3.4*  CL 102  CO2 32  GLUCOSE 109*  BUN 16  CREATININE 0.71  CALCIUM 9.1   Liver Function Tests: Recent Labs  Lab 08/25/17 1209  AST 16  ALT 15  ALKPHOS 79  BILITOT 1.1  PROT 6.8  ALBUMIN 3.9   No results for input(s): LIPASE, AMYLASE in the last 168 hours. No results for input(s): AMMONIA in the last 168 hours. CBC: Recent Labs  Lab 08/25/17 1209  WBC 10.4  NEUTROABS 8.7*  HGB 15.0  HCT 44.7  MCV 94.9  PLT 190   Cardiac Enzymes: Recent Labs  Lab 08/25/17 1209 08/25/17 2144 08/26/17 0330 08/26/17 0926  TROPONINI 0.03* <0.03 <0.03 <0.03   BNP: BNP (last 3 results) No results for input(s): BNP in the last 8760 hours.  ProBNP (last 3 results) No results for input(s): PROBNP in the last 8760 hours.  CBG: No results for input(s): GLUCAP in the last 168  hours.     Signed:  Lelon Frohlich  [facility] Pager: [phone] 08/26/2017, 2:04 PM

## 2017-08-27 ENCOUNTER — Telehealth: Payer: Self-pay | Admitting: Family

## 2017-08-27 ENCOUNTER — Encounter: Payer: Medicare Other | Admitting: Physical Therapy

## 2017-08-28 NOTE — Telephone Encounter (Signed)
With her heart hx she would benefit from taking aspirin everyday. However, if she has a hx of stomach ulcer or GI bleeding she could hold off on aspirin.

## 2017-08-28 NOTE — Telephone Encounter (Signed)
Patient reports she has never had a GI bleed or an ulcer, so she was advised per [name] she should continue to take an aspirin daily.

## 2017-09-03 ENCOUNTER — Ambulatory Visit: Payer: Medicare Other | Admitting: Physical Therapy

## 2017-09-03 ENCOUNTER — Encounter: Payer: Self-pay | Admitting: Physical Therapy

## 2017-09-03 DIAGNOSIS — R2681 Unsteadiness on feet: Secondary | ICD-10-CM

## 2017-09-03 DIAGNOSIS — R293 Abnormal posture: Secondary | ICD-10-CM

## 2017-09-03 DIAGNOSIS — M6281 Muscle weakness (generalized): Secondary | ICD-10-CM

## 2017-09-03 NOTE — Therapy (Addendum)
[facility] [place], [place], [address] Phone: [phone]   Fax:  [phone]  Physical Therapy Treatment  Patient Details  Name: Angela Wong MRN: [id number] Date of Birth: 11/21/1940 Referring Provider: Assunta Found MD.   Encounter Date: 09/03/2017  PT End of Session - 09/03/17 1404    Visit Number  5    Number of Visits  16    Date for PT Re-Evaluation  11/09/17    PT Start Time  7654    PT Stop Time  1343    PT Time Calculation (min)  27 min    Activity Tolerance  Patient limited by fatigue    Behavior During Therapy  [facility] for tasks assessed/performed       Past Medical History:  Diagnosis Date  . A-fib ([place])   . Asthma   . Cerebral vasculitis   . COPD (chronic obstructive pulmonary disease) ([place])   . Fibromyalgia   . Gastric polyp   . GERD (gastroesophageal reflux disease)   . Hiatal hernia   . Hyperparathyroidism ([place])   . IBS (irritable bowel syndrome)   . MVP (mitral valve prolapse)   . Osteoarthritis   . Ovarian cancer ([place])    lymph node removal with hysterectomy  . Raynaud's disease   . RLS (restless legs syndrome)   . Situational depression   . Sjogren's syndrome ([place])   . Vasculitis ([place])   . Vitamin D deficiency     Past Surgical History:  Procedure Laterality Date  . ABDOMINAL HYSTERECTOMY  1982   with right oophorectomy  . APPENDECTOMY    . BREAST BIOPSY Right    x 2  . BREAST SURGERY     Biopsy  . CARPAL TUNNEL RELEASE Right 1980  . CARPAL TUNNEL RELEASE Left 2010   x 2  . CATARACT EXTRACTION Bilateral   . CESAREAN SECTION     x 3  . KNEE SURGERY Left 2005  . OOPHORECTOMY Left 1962  . PACEMAKER INSERTION  09/15/2014  . REFRACTIVE SURGERY Bilateral 2014  . TUBAL LIGATION      There were no vitals filed for this visit.  Subjective Assessment - 09/03/17 1318    Subjective  Patient arrived feeling fatigue and some sorness all over, was in hospital from an episode of  irregualr heart beat     Pertinent History  RTC deficient; COPD; A. Fib.; OA; Raynauds; RLS; Sjogren's; left knee surgery; [place]; CTS.    Limitations  Walking;Sitting;Standing    How long can you sit comfortably?  20 minutes.    How long can you stand comfortably?  10 minutes.    How long can you walk comfortably?  Short community distnances.    Patient Stated Goals  Want to be able to stand straighter and walk better.    Currently in Pain?  Yes    Pain Score  5     Pain Location  Neck    Pain Orientation  Lower    Pain Descriptors / Indicators  Aching    Pain Type  Chronic pain    Pain Onset  More than a month ago    Pain Frequency  Constant    Aggravating Factors   any increased activity    Pain Relieving Factors  at rest                       [facility] Adult PT Treatment/Exercise - 09/03/17 0001  Exercises   Exercises  Knee/Hip;Lumbar;Shoulder      Lumbar Exercises: Seated   Other Seated Lumbar Exercises  seated for gentle ext x30 with small grey boulster for resistance      Lumbar Exercises: Supine   Ab Set  20 reps;3 seconds;10 reps    Glut Set  20 reps;3 seconds;10 reps    Clam  20 reps;3 seconds;Other (comment);5 reps;10 reps    Bent Knee Raise  3 seconds   2x10   Other Supine Lumbar Exercises  ball squeeze 5sec 2x20      Shoulder Exercises: Seated   Retraction  Strengthening;Both;20 reps;10 reps          Balance Exercises - 09/03/17 1350      Balance Exercises: Standing   Standing Eyes Opened  Narrow base of support (BOS);Wide (BOA);Foam/compliant surface;Time   44mn         PT Short Term Goals - 08/13/17 1352      PT SHORT TERM GOAL #1   Title  Ind with initial HEP.    Time  4    Period  Weeks    Status  Achieved      PT SHORT TERM GOAL #2   Title  Improve Berg score to 46/56.    Time  4    Period  Weeks    Status  On-going        PT Long Term Goals - 08/18/17 1346      PT LONG TERM GOAL #1   Title  Ind with  advanced HEP.    Time  8    Period  Weeks    Status  On-going      PT LONG TERM GOAL #2   Title  Patient instructed in correct posture and able to verbalize.    Time  8    Period  Weeks    Status  On-going      PT LONG TERM GOAL #3   Title  Patient walk in upright posture.    Time  8    Period  Weeks    Status  On-going      PT LONG TERM GOAL #4   Title  Improve Berg score to 49-50/56.    Time  8    Period  Weeks    Status  On-going            Plan - 09/03/17 1408    Clinical Impression Statement  Patient tolerated treatment fair today. Patient unable to complete full sesion of therapy due to fatigue and soreness all over. Today focused on low level exericses to help strengthen patient without any further discomfort. Patient is going for a shot in her knees tomorrow. Patient goals ongoing at this time.     Rehab Potential  Fair    Clinical Impairments Affecting Rehab Potential  Chronic pain and balance deficits ongoing.    PT Frequency  2x / week    PT Duration  8 weeks    PT Next Visit Plan  Low-level balance activities, core and spinal exercises.  No Nustep.    Consulted and Agree with Plan of Care  Patient       Patient will benefit from skilled therapeutic intervention in order to improve the following deficits and impairments:  Abnormal gait, Decreased activity tolerance, Decreased balance, Decreased coordination, Decreased strength, Difficulty walking, Pain  Visit Diagnosis: Unsteadiness on feet  Muscle weakness (generalized)  Abnormal posture     Problem List Patient Active  Problem List   Diagnosis Date Noted  . Chest pain 08/25/2017  . Generalized weakness 08/25/2017  . Seasonal and perennial allergic rhinitis 12/31/2016  . Allergic rhinitis with a nonallergic component 10/01/2016  . Pruritus 10/01/2016  . 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 Steroid-induced osteopenia 07/11/2016  . Hyperparathyroidism ([place]) 01/16/2016  . Iron deficiency anemia  12/18/2015  . Insomnia 12/18/2015  . Hypothyroidism 12/18/2015  . Rotator cuff tear 03/23/2014  . Osteoarthritis   . Moderate persistent asthma   . Ovarian cancer ([place])   . Fibromyalgia   . RLS (restless legs syndrome)   . Cerebral vasculitis   . Sjogren's syndrome ([place])   . Raynaud's disease   . MVP (mitral valve prolapse)   . IBS (irritable bowel syndrome)   . Congestive dilated cardiomyopathy ([place]) 02/09/2014  . Dyspnea 12/13/2013  . Hyperlipidemia 11/26/2013  . Malaise and fatigue 11/26/2013  . Multiple drug allergies 11/02/2013  . History of ovarian cancer 10/01/2013  . GERD (gastroesophageal reflux disease) 10/01/2013  . Benign essential HTN 10/01/2013  . Atrial fibrillation, permanent ([place]) 07/27/2013    Fizza Scales P, PTA 09/03/2017, 2:10 PM  [facility] [place], [place], [address] Phone: [phone]   Fax:  [phone]  Name: Angela Wong MRN: [id number] Date of Birth: [birth year]  PHYSICAL THERAPY DISCHARGE SUMMARY  Visits from Start of Care: 5.  Current functional level related to goals / functional outcomes: See above.   Remaining deficits: See below.   Education / Equipment: HEP. Plan: Patient agrees to discharge.  Patient goals were not met. Patient is being discharged due to not returning since the last visit.  ?????         Mali Applegate MPT

## 2017-09-04 DIAGNOSIS — M1712 Unilateral primary osteoarthritis, left knee: Secondary | ICD-10-CM | POA: Diagnosis not present

## 2017-09-04 DIAGNOSIS — M7062 Trochanteric bursitis, left hip: Secondary | ICD-10-CM | POA: Diagnosis not present

## 2017-09-05 ENCOUNTER — Encounter: Payer: Self-pay | Admitting: Pharmacist Clinician (PhC)/ Clinical Pharmacy Specialist

## 2017-09-11 ENCOUNTER — Encounter: Payer: Medicare Other | Admitting: Physical Therapy

## 2017-09-12 ENCOUNTER — Ambulatory Visit: Payer: Medicare Other | Admitting: Pharmacist Clinician (PhC)/ Clinical Pharmacy Specialist

## 2017-09-12 ENCOUNTER — Encounter: Payer: Self-pay | Admitting: Family Medicine

## 2017-09-12 ENCOUNTER — Ambulatory Visit (INDEPENDENT_AMBULATORY_CARE_PROVIDER_SITE_OTHER): Payer: Medicare Other | Admitting: Family Medicine

## 2017-09-12 VITALS — BP 136/78 | HR 80 | Temp 98.3°F | Wt 140.6 lb

## 2017-09-12 DIAGNOSIS — R5383 Other fatigue: Secondary | ICD-10-CM | POA: Diagnosis not present

## 2017-09-12 DIAGNOSIS — W57XXXA Bitten or stung by nonvenomous insect and other nonvenomous arthropods, initial encounter: Secondary | ICD-10-CM

## 2017-09-12 DIAGNOSIS — R0602 Shortness of breath: Secondary | ICD-10-CM | POA: Diagnosis not present

## 2017-09-12 DIAGNOSIS — F321 Major depressive disorder, single episode, moderate: Secondary | ICD-10-CM

## 2017-09-12 DIAGNOSIS — I4821 Permanent atrial fibrillation: Secondary | ICD-10-CM

## 2017-09-12 DIAGNOSIS — S80869A Insect bite (nonvenomous), unspecified lower leg, initial encounter: Secondary | ICD-10-CM

## 2017-09-12 DIAGNOSIS — I482 Chronic atrial fibrillation: Secondary | ICD-10-CM | POA: Diagnosis not present

## 2017-09-12 LAB — COAGUCHEK XS/INR WAIVED
INR: 2.1 — ABNORMAL HIGH (ref 0.9–1.1)
PROTHROMBIN TIME: 25.3 s

## 2017-09-12 NOTE — Addendum Note (Signed)
Addended by: Baruch Gouty on: 09/12/2017 03:25 PM   Modules accepted: Orders

## 2017-09-12 NOTE — Progress Notes (Addendum)
Subjective:    Patient ID: SHYNIA DALEO, female    DOB: 04/24/1940, 77 y.o.   MRN: [id number]  Chief Complaint:  Fatigue (chronic pain interfering with sleep) and Shortness of Breath   HPI: Angela Wong is a 77 y.o. female presenting on 09/12/2017 for Fatigue (chronic pain interfering with sleep) and Shortness of Breath Patient reports the shortness of breath is intermittent and is exacerbated by exertion. Denies shortness of breath at rest, denies orthopnea. Pt denies chest pain, palpitations, or dizziness with the shortness of breath. States this has only happened a few times. She reports she has been fatigued since discharge from the hospital on 08/26/17. States the fatigue is daily and interferes with her daily activities. She states her fibromyalgia pain interferes with her sleep and this adds to her fatigue. She states after she was discharged from the hospital she had a tick bite to her right posterior thigh. States she removed the tick, but a red rash developed around the bite mark. States this rash lasted for several days but is gone now. States she has diffuse joint pain, unknown if this is due to her fibromyalgia or the tick bite. States the joint pain is constant and a 6/10. States she did take Cymbalta in the past that helped with the pain but she developed leg swelling and had to stop taking it. States she sees a [employer] officer for her fibromyalgia. She states he wanted to place her on medications but she refused. She has been using CBD ointment for the joint pain with some relief. Pt states she has been under a lot of stress dealing with her children who do not get along and this is causing her to be depressed. She states the depression is daily. She denies suicidal or homicidal ideations. She states she has tried medications in the past but was unable to tolerate them and states she does not want to be placed on any medications for her depression. Pt also reports she noticed a  white area on her labia, denies drainage, pruritis, or pain.  Relevant past medical, surgical, family and social history reviewed and updated as indicated. Interim medical history since our last visit reviewed. Allergies and medications reviewed and updated. DATA REVIEWED: CHART IN EPIC  Family History reviewed for pertinent findings.  Past Medical History:  Diagnosis Date  . A-fib ([place])   . Asthma   . Cerebral vasculitis   . COPD (chronic obstructive pulmonary disease) ([place])   . Fibromyalgia   . Gastric polyp   . GERD (gastroesophageal reflux disease)   . Hiatal hernia   . Hyperparathyroidism ([place])   . IBS (irritable bowel syndrome)   . MVP (mitral valve prolapse)   . Osteoarthritis   . Ovarian cancer ([place])    lymph node removal with hysterectomy  . Raynaud's disease   . RLS (restless legs syndrome)   . Situational depression   . Sjogren's syndrome ([place])   . Vasculitis ([place])   . Vitamin D deficiency     Past Surgical History:  Procedure Laterality Date  . ABDOMINAL HYSTERECTOMY  1982   with right oophorectomy  . APPENDECTOMY    . BREAST BIOPSY Right    x 2  . BREAST SURGERY     Biopsy  . CARPAL TUNNEL RELEASE Right 1980  . CARPAL TUNNEL RELEASE Left 2010   x 2  . CATARACT EXTRACTION Bilateral   . CESAREAN SECTION     x 3  . KNEE  SURGERY Left 2005  . OOPHORECTOMY Left 1962  . PACEMAKER INSERTION  09/15/2014  . REFRACTIVE SURGERY Bilateral 2014  . TUBAL LIGATION      Social History   Socioeconomic History  . Marital status: Single    Spouse name: Not on file  . Number of children: Not on file  . Years of education: Not on file  . Highest education level: Not on file  Occupational History  . Not on file  Social Needs  . Financial resource strain: Not on file  . Food insecurity:    Worry: Not on file    Inability: Not on file  . Transportation needs:    Medical: Not on file    Non-medical: Not on file  Tobacco Use  . Smoking status: Never Smoker    . Smokeless tobacco: Never Used  Substance and Sexual Activity  . Alcohol use: No  . Drug use: No  . Sexual activity: Not on file  Lifestyle  . Physical activity:    Days per week: Not on file    Minutes per session: Not on file  . Stress: Not on file  Relationships  . Social connections:    Talks on phone: Not on file    Gets together: Not on file    Attends religious service: Not on file    Active member of club or organization: Not on file    Attends meetings of clubs or organizations: Not on file    Relationship status: Not on file  . Intimate partner violence:    Fear of current or ex partner: Not on file    Emotionally abused: Not on file    Physically abused: Not on file    Forced sexual activity: Not on file  Other Topics Concern  . Not on file  Social History Narrative   Lives alone.  Moved from [place].     Caffeine- 6 cups daily, mix of caffeine/decaf   Children- 3   Retired Therapist, sports    Allergies as of 09/12/2017      Reactions   Cephalosporins Hives, Shortness Of Breath   Ciprofloxacin Hives, Shortness Of Breath   Doxycycline Hives, Shortness Of Breath   Horse-derived Products Anaphylaxis   Ketek [telithromycin] Palpitations   Chest discomfort   Nitrofuran Derivatives Anaphylaxis, Hives   blisters   Nitrous Oxide Nausea And Vomiting   Severe due to Sjogrens (Auto-Immune Disease)   Other Anaphylaxis   ALLERGY TO HORSE SERUM   Penicillins Hives, Shortness Of Breath   Pentazocine Other (See Comments)   Other reaction(s): Mental Status Changes (intolerance) Altered Mental Status  Altered Mental Status  Altered Mental Status    Sulfa Antibiotics Hives, Shortness Of Breath   Trovan [alatrofloxacin] Palpitations, Other (See Comments), Anaphylaxis   Chest pain, dizziness, irregular pulse   Amlodipine Swelling   Calcium Channel Blockers    Respiratory distress   Carvedilol Other (See Comments)   Dizziness, "joint pain, depression"   Codeine Nausea Only   Cymbalta  [duloxetine Hcl] Swelling   Diltiazem    Swollen throat   Diovan [valsartan] Swelling   Lisinopril Swelling   Sertraline    Tramadol Nausea Only   Zoloft [sertraline Hcl]    Loteprednol Etabonate Rash      Medication List        Accurate as of 09/12/17  2:39 PM. Always use your most recent med list.          albuterol 108 (90 Base) MCG/ACT inhaler Commonly  known as:  PROVENTIL HFA;VENTOLIN HFA Inhale 2 puffs into the lungs every 6 (six) hours as needed for wheezing.   aspirin 81 MG EC tablet Take 1 tablet (81 mg total) by mouth daily.   atenolol 25 MG tablet Commonly known as:  TENORMIN Take 12.5 mg by mouth daily.   azelastine 0.1 % nasal spray Commonly known as:  ASTELIN Place 2 sprays into both nostrils 2 (two) times daily.   B-complex with vitamin C tablet Take 1 tablet by mouth daily.   B-complex with vitamin C tablet Take 1 tablet by mouth daily.   budesonide-formoterol 160-4.5 MCG/ACT inhaler Commonly known as:  SYMBICORT Inhale 2 puffs into the lungs 2 (two) times daily.   bumetanide 0.5 MG tablet Commonly known as:  BUMEX Take 1 tablet (0.5 mg total) by mouth every other day.   bumetanide 1 MG tablet Commonly known as:  BUMEX Take 1 tablet (1 mg total) by mouth every other day. On opposite day of 0.47m of bumetadnide   cyclobenzaprine 5 MG tablet Commonly known as:  FLEXERIL Take 0.5 tablets (2.5 mg total) by mouth 2 (two) times daily as needed for muscle spasms.   cycloSPORINE 0.05 % ophthalmic emulsion Commonly known as:  RESTASIS Place 1 drop into both eyes 2 (two) times daily.   docusate sodium 100 MG capsule Commonly known as:  COLACE Take 400 mg by mouth daily.   esomeprazole 40 MG capsule Commonly known as:  NEXIUM Take 1 capsule (40 mg total) by mouth 2 (two) times daily before a meal.   fexofenadine 180 MG tablet Commonly known as:  ALLEGRA Take 180 mg by mouth every morning.   fluticasone 50 MCG/ACT nasal spray Commonly known  as:  FLONASE Place 2 sprays into both nostrils daily.   GAS-X EXTRA STRENGTH 125 MG Caps Generic drug:  Simethicone Take 2 capsules by mouth daily as needed (for relief).   Iron Polysacch Cmplx-B12-FA 150-0.025-1 MG Caps Take 1 tablet by mouth daily.   levothyroxine 50 MCG tablet Commonly known as:  SYNTHROID, LEVOTHROID Take 100 mcg by mouth daily.   losartan 100 MG tablet Commonly known as:  COZAAR TAKE 1 TABLET BY MOUTH ONCE DAILY (NEEDS  APPOINTMENT)   Magnesium 200 MG Tabs Take 1 tablet by mouth daily.   metroNIDAZOLE 1 % gel Commonly known as:  METROGEL Apply topically daily.   montelukast 10 MG tablet Commonly known as:  SINGULAIR TAKE 1 TABLET BY MOUTH ONCE DAILY AT BEDTIME   Olopatadine HCl 0.2 % Soln Apply 1 drop to eye daily.   predniSONE 5 MG tablet Commonly known as:  DELTASONE TAKE 1 TABLET BY MOUTH ONCE DAILY   PREVIDENT 5000 DRY MOUTH 1.1 % Gel dental gel Generic drug:  sodium fluoride Place 1 application onto teeth as needed (as needed for dry mouth).   ranitidine 150 MG tablet Commonly known as:  ZANTAC Take 1 tablet (150 mg total) by mouth at bedtime.   traZODone 150 MG tablet Commonly known as:  DESYREL TAKE 1 TABLET BY MOUTH AT BEDTIME   VITAMIN B 12 PO Take 500 mg by mouth daily.   Vitamin D 2000 units Caps Take 2 capsules by mouth every evening.   warfarin 1 MG tablet Commonly known as:  COUMADIN Take as directed by the anticoagulation clinic. If you are unsure how to take this medication, talk to your nurse or doctor. Original instructions:  TAKE 2 TO 4 TABLETS (= 2 TO 4MG) BY MOUTH ONCE DAILY AS  DIRECTED BY ANTICOGULATION CLINIC.       Allergies  Allergen Reactions  . Cephalosporins Hives and Shortness Of Breath  . Ciprofloxacin Hives and Shortness Of Breath  . Doxycycline Hives and Shortness Of Breath  . Horse-Derived Products Anaphylaxis  . Ketek [Telithromycin] Palpitations    Chest discomfort  . Nitrofuran Derivatives  Anaphylaxis and Hives    blisters  . Nitrous Oxide Nausea And Vomiting    Severe due to Sjogrens (Auto-Immune Disease)  . Other Anaphylaxis    ALLERGY TO HORSE SERUM  . Penicillins Hives and Shortness Of Breath  . Pentazocine Other (See Comments)    Other reaction(s): Mental Status Changes (intolerance) Altered Mental Status  Altered Mental Status  Altered Mental Status   . Sulfa Antibiotics Hives and Shortness Of Breath  . Trovan [Alatrofloxacin] Palpitations, Other (See Comments) and Anaphylaxis    Chest pain, dizziness, irregular pulse  . Amlodipine Swelling  . Calcium Channel Blockers     Respiratory distress  . Carvedilol Other (See Comments)    Dizziness, "joint pain, depression"  . Codeine Nausea Only  . Cymbalta [Duloxetine Hcl] Swelling  . Diltiazem     Swollen throat   . Diovan [Valsartan] Swelling  . Lisinopril Swelling  . Sertraline   . Tramadol Nausea Only  . Zoloft [Sertraline Hcl]   . Loteprednol Etabonate Rash    Review of Systems  Constitutional: Positive for activity change and fatigue. Negative for appetite change, chills and fever.  HENT: Negative.   Eyes: Negative.   Respiratory: Positive for shortness of breath. Negative for cough and chest tightness.   Cardiovascular: Negative for chest pain, palpitations and leg swelling.  Gastrointestinal: Negative for abdominal pain and nausea.  Endocrine: Negative for cold intolerance, heat intolerance, polydipsia, polyphagia and polyuria.  Musculoskeletal: Positive for arthralgias and myalgias.  Skin: Positive for rash. Negative for color change, pallor and wound.       Had a red circular rash around tick bite, rash has now subsided.  Neurological: Negative for dizziness, weakness, light-headedness and headaches.  Psychiatric/Behavioral: Positive for sleep disturbance. Negative for suicidal ideas.   Depression screen PHQ 2/9 09/12/2017  Decreased Interest 2  Down, Depressed, Hopeless 2  PHQ - 2 Score 4    Altered sleeping 2  Tired, decreased energy 3  Change in appetite 0  Feeling bad or failure about yourself  1  Trouble concentrating 0  Moving slowly or fidgety/restless 2  Suicidal thoughts 0  PHQ-9 Score 12  Difficult doing work/chores Not difficult at all       Objective:    BP 136/78 (BP Location: Left Arm, Patient Position: Sitting, Cuff Size: Normal)   Pulse 80   Temp 98.3 F (36.8 C) (Oral)   Wt 140 lb 9.6 oz (63.8 kg)   BMI 24.13 kg/m    Wt Readings from Last 3 Encounters:  09/12/17 140 lb 9.6 oz (63.8 kg)  08/25/17 136 lb (61.7 kg)  08/15/17 141 lb 12.8 oz (64.3 kg)    Physical Exam  Constitutional: She is oriented to person, place, and time. She appears well-developed and well-nourished. She is cooperative.  HENT:  Head: Normocephalic and atraumatic.  Nose: Nose normal.  Eyes: Pupils are equal, round, and reactive to light. Conjunctivae and EOM are normal.  Neck: Normal range of motion and phonation normal. Neck supple.  Cardiovascular: Normal rate, S1 normal, S2 normal, normal heart sounds, intact distal pulses and normal pulses. An irregular rhythm present.  EKG-A-fib with demand ventricular pacemaker,  no ST elevation  Pulmonary/Chest: Effort normal and breath sounds normal. No tachypnea. No respiratory distress.  Genitourinary: Pelvic exam was performed with patient supine. There is no rash, tenderness, lesion or injury on the right labia. There is no rash, tenderness, lesion or injury on the left labia.  Musculoskeletal: She exhibits no edema.  Neurological: She is alert and oriented to person, place, and time.  Skin: Skin is warm and dry. Capillary refill takes less than 2 seconds. No lesion and no rash noted. No erythema.  Psychiatric: Her speech is normal and behavior is normal. Judgment normal. She is not actively hallucinating. Thought content is not paranoid. Cognition and memory are normal. She exhibits a depressed mood. She expresses no homicidal and  no suicidal ideation.  Nursing note and vitals reviewed.  EKG- A-Fib with demand ventricular pacemaker, no ST elevation. Monia Pouch, FNP-C  Results for orders placed or performed in visit on 09/12/17  CoaguChek XS/INR Waived  Result Value Ref Range   INR 2.1 (H) 0.9 - 1.1   Prothrombin Time 25.3 sec      Assessment & Plan:   1. SOB (shortness of breath) - EKG 12-Lead - CBC with Differential/Platelet  2. Depression, major, single episode, moderate (HCC) Medications offered, pt refused. - Ambulatory referral to Psychiatry  3. Fatigue, unspecified type - CMP14+EGFR - TSH  4. Tick bite of lower leg, initial encounter - Lyme Ab/Western Blot Reflex - Rocky mtn spotted fvr abs pnl  Total time spent with patient 60 minutes.  Greater than 50% of encounter spent in coordination of care/counseling.  Pt was offered a SSRI or SNRI at a low starting dose for her depression, pt refused. Pt states she has tried all of these medications in the past with unpleasant side effects. Pt states she does not want to go to a counselor, but will try to see if it helps. Pt given information on coping techniques to use at home. Pt aware to seek medical attention if she has suicidal or homicidal ideations.   Follow up plan: JEANELL MANGAN comes in today with chief complaint of Fatigue (chronic pain interfering with sleep) and Shortness of Breath  Labs pending, referral to psychiatrist made. Continue current medications. Keep appointment with [name], FNP, on 08/16/2017.   Educational handout given for Stress Management, Depression, Fatigue  The above assessment and management plan was discussed with the patient. The patient verbalized understanding of and has agreed to the management plan. Patient is aware to call the clinic if symptoms persist or worsen. Patient is aware when to return to the clinic for a follow-up visit. Patient educated on when it is appropriate to go to the emergency  department.   Monia Pouch, FNP-C [facility] [phone]

## 2017-09-12 NOTE — Patient Instructions (Signed)
Stress and Stress Management Stress is a normal reaction to life events. It is what you feel when life demands more than you are used to or more than you can handle. Some stress can be useful. For example, the stress reaction can help you catch the last bus of the day, study for a test, or meet a deadline at work. But stress that occurs too often or for too long can cause problems. It can affect your emotional health and interfere with relationships and normal daily activities. Too much stress can weaken your immune system and increase your risk for physical illness. If you already have a medical problem, stress can make it worse. What are the causes? All sorts of life events may cause stress. An event that causes stress for one person may not be stressful for another person. Major life events commonly cause stress. These may be positive or negative. Examples include losing your job, moving into a new home, getting married, having a baby, or losing a loved one. Less obvious life events may also cause stress, especially if they occur day after day or in combination. Examples include working long hours, driving in traffic, caring for children, being in debt, or being in a difficult relationship. What are the signs or symptoms? Stress may cause emotional symptoms including, the following:  Anxiety. This is feeling worried, afraid, on edge, overwhelmed, or out of control.  Anger. This is feeling irritated or impatient.  Depression. This is feeling sad, down, helpless, or guilty.  Difficulty focusing, remembering, or making decisions.  Stress may cause physical symptoms, including the following:  Aches and pains. These may affect your head, neck, back, stomach, or other areas of your body.  Tight muscles or clenched jaw.  Low energy or trouble sleeping.  Stress may cause unhealthy behaviors, including the following:  Eating to feel better (overeating) or skipping meals.  Sleeping too little,  too much, or both.  Working too much or putting off tasks (procrastination).  Smoking, drinking alcohol, or using drugs to feel better.  How is this diagnosed? Stress is diagnosed through an assessment by your health care provider. Your health care provider will ask questions about your symptoms and any stressful life events.Your health care provider will also ask about your medical history and may order blood tests or other tests. Certain medical conditions and medicine can cause physical symptoms similar to stress. Mental illness can cause emotional symptoms and unhealthy behaviors similar to stress. Your health care provider may refer you to a mental health professional for further evaluation. How is this treated? Stress management is the recommended treatment for stress.The goals of stress management are reducing stressful life events and coping with stress in healthy ways. Techniques for reducing stressful life events include the following:  Stress identification. Self-monitor for stress and identify what causes stress for you. These skills may help you to avoid some stressful events.  Time management. Set your priorities, keep a calendar of events, and learn to say "no." These tools can help you avoid making too many commitments.  Techniques for coping with stress include the following:  Rethinking the problem. Try to think realistically about stressful events rather than ignoring them or overreacting. Try to find the positives in a stressful situation rather than focusing on the negatives.  Exercise. Physical exercise can release both physical and emotional tension. The key is to find a form of exercise you enjoy and do it regularly.  Relaxation techniques. These relax the body and  mind. Examples include yoga, meditation, tai chi, biofeedback, deep breathing, progressive muscle relaxation, listening to music, being out in nature, journaling, and other hobbies. Again, the key is to find  one or more that you enjoy and can do regularly.  Healthy lifestyle. Eat a balanced diet, get plenty of sleep, and do not smoke. Avoid using alcohol or drugs to relax.  Strong support network. Spend time with family, friends, or other people you enjoy being around.Express your feelings and talk things over with someone you trust.  Counseling or talktherapy with a mental health professional may be helpful if you are having difficulty managing stress on your own. Medicine is typically not recommended for the treatment of stress.Talk to your health care provider if you think you need medicine for symptoms of stress. Follow these instructions at home:  Keep all follow-up visits as directed by your health care provider.  Take all medicines as directed by your health care provider. Contact a health care provider if:  Your symptoms get worse or you start having new symptoms.  You feel overwhelmed by your problems and can no longer manage them on your own. Get help right away if:  You feel like hurting yourself or someone else. This information is not intended to replace advice given to you by your health care provider. Make sure you discuss any questions you have with your health care provider. Document Released: 06/26/2000 Document Revised: 06/08/2015 Document Reviewed: 08/25/2012 Elsevier Interactive Patient Education  2017 Elsevier Inc. Fatigue Fatigue is feeling tired all of the time, a lack of energy, or a lack of motivation. Occasional or mild fatigue is often a normal response to activity or life in general. However, long-lasting (chronic) or extreme fatigue may indicate an underlying medical condition. Follow these instructions at home: Watch your fatigue for any changes. The following actions may help to lessen any discomfort you are feeling:  Talk to your health care provider about how much sleep you need each night. Try to get the required amount every night.  Take medicines only  as directed by your health care provider.  Eat a healthy and nutritious diet. Ask your health care provider if you need help changing your diet.  Drink enough fluid to keep your urine clear or pale yellow.  Practice ways of relaxing, such as yoga, meditation, massage therapy, or acupuncture.  Exercise regularly.  Change situations that cause you stress. Try to keep your work and personal routine reasonable.  Do not abuse illegal drugs.  Limit alcohol intake to no more than 1 drink per day for nonpregnant women and 2 drinks per day for men. One drink equals 12 ounces of beer, 5 ounces of wine, or 1 ounces of hard liquor.  Take a multivitamin, if directed by your health care provider.  Contact a health care provider if:  Your fatigue does not get better.  You have a fever.  You have unintentional weight loss or gain.  You have headaches.  You have difficulty: ? Falling asleep. ? Sleeping throughout the night.  You feel angry, guilty, anxious, or sad.  You are unable to have a bowel movement (constipation).  You skin is dry.  Your legs or another part of your body is swollen. Get help right away if:  You feel confused.  Your vision is blurry.  You feel faint or pass out.  You have a severe headache.  You have severe abdominal, pelvic, or back pain.  You have chest pain, shortness of breath,  or an irregular or fast heartbeat.  You are unable to urinate or you urinate less than normal.  You develop abnormal bleeding, such as bleeding from the rectum, vagina, nose, lungs, or nipples.  You vomit blood.  You have thoughts about harming yourself or committing suicide.  You are worried that you might harm someone else. This information is not intended to replace advice given to you by your health care provider. Make sure you discuss any questions you have with your health care provider. Document Released: 10/28/2006 Document Revised: 06/08/2015 Document Reviewed:  05/04/2013 Elsevier Interactive Patient Education  2018 [employer]. Major Depressive Disorder, Adult Major depressive disorder (MDD) is a mental health condition. MDD often makes you feel sad, hopeless, or helpless. MDD can also cause symptoms in your body. MDD can affect your:  Work.  School.  Relationships.  Other normal activities.  MDD can range from mild to very bad. It may occur once (single episode MDD). It can also occur many times (recurrent MDD). The main symptoms of MDD often include:  Feeling sad, depressed, or irritable most of the time.  Loss of interest.  MDD symptoms also include:  Sleeping too much or too little.  Eating too much or too little.  A change in your weight.  Feeling tired (fatigue) or having low energy.  Feeling worthless.  Feeling guilty.  Trouble making decisions.  Trouble thinking clearly.  Thoughts of suicide or harming others.  Feeling weak.  Feeling agitated.  Keeping yourself from being around other people (isolation).  Follow these instructions at home: Activity  Do these things as told by your doctor: ? Go back to your normal activities. ? Exercise regularly. ? Spend time outdoors. Alcohol  Talk with your doctor about how alcohol can affect your antidepressant medicines.  Do not drink alcohol. Or, limit how much alcohol you drink. ? This means no more than 1 drink a day for nonpregnant women and 2 drinks a day for men. One drink equals one of these:  12 oz of beer.  5 oz of wine.  1 oz of hard liquor. General instructions  Take over-the-counter and prescription medicines only as told by your doctor.  Eat a healthy diet.  Get plenty of sleep.  Find activities that you enjoy. Make time to do them.  Think about joining a support group. Your doctor may be able to suggest a group for you.  Keep all follow-up visits as told by your doctor. This is important. Where to find more information:  [employer] Mental Illness: ? www.nami.[place]: ? https://carter.com/  National Suicide Prevention Lifeline: ? 930-096-1690. This is free, 24-hour help. Contact a doctor if:  Your symptoms get worse.  You have new symptoms. Get help right away if:  You self-harm.  You see, hear, taste, smell, or feel things that are not present (hallucinate). If you ever feel like you may hurt yourself or others, or have thoughts about taking your own life, get help right away. You can go to your nearest emergency department or call:  Your local emergency services (911 in the [place]  A suicide crisis helpline, such as the National Suicide Prevention Lifeline: ? 8015406953. This is open 24 hours a day.  This information is not intended to replace advice given to you by your health care provider. Make sure you discuss any questions you have with your health care provider. Document Released: 12/12/2014 Document Revised: 09/17/2015 Document Reviewed: 09/17/2015  Elsevier Interactive Patient Education  2017 Elsevier Inc.  

## 2017-09-12 NOTE — Patient Instructions (Signed)
Description   Continue warfarin dose of 4mg  on Mondays, Wednesdays and Fridays. Take 2mg  all other days.  INR was 2.1 today (goal is 2.0 to 3.0)  Perfect reading today

## 2017-09-16 ENCOUNTER — Encounter: Payer: Self-pay | Admitting: Family

## 2017-09-16 ENCOUNTER — Ambulatory Visit (INDEPENDENT_AMBULATORY_CARE_PROVIDER_SITE_OTHER): Payer: Medicare Other | Admitting: Family

## 2017-09-16 VITALS — BP 137/80 | HR 77 | Temp 98.3°F | Ht 64.0 in | Wt 140.4 lb

## 2017-09-16 DIAGNOSIS — R079 Chest pain, unspecified: Secondary | ICD-10-CM | POA: Diagnosis not present

## 2017-09-16 DIAGNOSIS — I482 Chronic atrial fibrillation: Secondary | ICD-10-CM | POA: Diagnosis not present

## 2017-09-16 DIAGNOSIS — M797 Fibromyalgia: Secondary | ICD-10-CM | POA: Diagnosis not present

## 2017-09-16 DIAGNOSIS — I4821 Permanent atrial fibrillation: Secondary | ICD-10-CM

## 2017-09-16 DIAGNOSIS — E039 Hypothyroidism, unspecified: Secondary | ICD-10-CM | POA: Diagnosis not present

## 2017-09-16 DIAGNOSIS — I420 Dilated cardiomyopathy: Secondary | ICD-10-CM | POA: Diagnosis not present

## 2017-09-16 DIAGNOSIS — R531 Weakness: Secondary | ICD-10-CM

## 2017-09-16 DIAGNOSIS — M1990 Unspecified osteoarthritis, unspecified site: Secondary | ICD-10-CM | POA: Diagnosis not present

## 2017-09-16 LAB — CMP14+EGFR
ALBUMIN: 4.2 g/dL (ref 3.5–4.8)
ALT: 15 IU/L (ref 0–32)
AST: 14 IU/L (ref 0–40)
Albumin/Globulin Ratio: 2.1 (ref 1.2–2.2)
Alkaline Phosphatase: 87 IU/L (ref 39–117)
BILIRUBIN TOTAL: 0.6 mg/dL (ref 0.0–1.2)
BUN / CREAT RATIO: 21 (ref 12–28)
BUN: 15 mg/dL (ref 8–27)
CALCIUM: 9.1 mg/dL (ref 8.7–10.3)
CO2: 27 mmol/L (ref 20–29)
Chloride: 98 mmol/L (ref 96–106)
Creatinine, Ser: 0.72 mg/dL (ref 0.57–1.00)
GFR, EST AFRICAN AMERICAN: 93 mL/min/{1.73_m2} (ref >59)
GFR, EST NON AFRICAN AMERICAN: 81 mL/min/{1.73_m2} (ref >59)
Globulin, Total: 2 g/dL (ref 1.5–4.5)
Glucose: 88 mg/dL (ref 65–99)
POTASSIUM: 3.7 mmol/L (ref 3.5–5.2)
Sodium: 143 mmol/L (ref 134–144)
TOTAL PROTEIN: 6.2 g/dL (ref 6.0–8.5)

## 2017-09-16 LAB — CBC WITH DIFFERENTIAL/PLATELET
BASOS: 1 %
Basophils Absolute: 0.1 10*3/uL (ref 0.0–0.2)
EOS (ABSOLUTE): 0 10*3/uL (ref 0.0–0.4)
EOS: 0 %
HEMATOCRIT: 44.9 % (ref 34.0–46.6)
HEMOGLOBIN: 15.2 g/dL (ref 11.1–15.9)
IMMATURE GRANS (ABS): 0.1 10*3/uL (ref 0.0–0.1)
IMMATURE GRANULOCYTES: 1 %
LYMPHS: 10 %
Lymphocytes Absolute: 1.1 10*3/uL (ref 0.7–3.1)
MCH: 30.7 pg (ref 26.6–33.0)
MCHC: 33.9 g/dL (ref 31.5–35.7)
MCV: 91 fL (ref 79–97)
MONOCYTES: 6 %
Monocytes Absolute: 0.6 10*3/uL (ref 0.1–0.9)
Neutrophils Absolute: 9 10*3/uL — ABNORMAL HIGH (ref 1.4–7.0)
Neutrophils: 82 %
PLATELETS: 218 10*3/uL (ref 150–450)
RBC: 4.95 x10E6/uL (ref 3.77–5.28)
RDW: 12.6 % (ref 12.3–15.4)
WBC: 10.9 10*3/uL — ABNORMAL HIGH (ref 3.4–10.8)

## 2017-09-16 LAB — TSH: TSH: 0.896 u[IU]/mL (ref 0.450–4.500)

## 2017-09-16 LAB — LYME AB/WESTERN BLOT REFLEX
LYME DISEASE AB, QUANT, IGM: <0.8 index (ref 0.00–0.79)
Lyme IgG/IgM Ab: <0.91 {ISR} (ref 0.00–0.90)

## 2017-09-16 MED ORDER — LEVOTHYROXINE SODIUM 50 MCG PO TABS: 100.0000 ug | ORAL_TABLET | Freq: Every day | ORAL | 2 refills | Status: DC

## 2017-09-16 MED ORDER — ONDANSETRON HCL 4 MG PO TABS: 4.0000 mg | ORAL_TABLET | Freq: Three times a day (TID) | ORAL | 0 refills | Status: DC | PRN

## 2017-09-16 MED ORDER — PREDNISONE 5 MG PO TABS: 5.0000 mg | ORAL_TABLET | Freq: Every day | ORAL | 1 refills | Status: DC

## 2017-09-16 MED ORDER — HYDROCODONE-ACETAMINOPHEN 7.5-325 MG PO TABS: 1.0000 | ORAL_TABLET | Freq: Two times a day (BID) | ORAL | 0 refills | Status: DC | PRN

## 2017-09-16 NOTE — Progress Notes (Signed)
Subjective:    Patient ID: Angela Wong, female    DOB: 05/30/1940, 77 y.o.   MRN: [id number]  Chief Complaint  Patient presents with  . Hospitalization Follow-up    HPI Pt presents to the office today for hospital follow up. She was seen in the office on 08/25/17 for chest pain. She was admitted for observation. Echo EF 50 %, negative troponins. It was felt her weakness was related to her stress. She has hx A Fib and CHF.    States her stress is slightly better, but has some days that are worse than others.   She reports she is having increased pain in her right hip. She reports this pain is constant 8 out 10.  She saw her Ortho who gave her a steroid injection in her left knee and left hip. She reports she is not able to cook, change her bedding, go to church, or go the store. She states her sons help her at times.   She has fibromyalgia and has seen a rheumatologists in the past. She continues to take prednisone 5 mg daily.      Review of Systems  All other systems reviewed and are negative.      Objective:   Physical Exam  Constitutional: She is oriented to person, place, and time. She appears well-developed and well-nourished. No distress.  HENT:  Head: Normocephalic.  Eyes: Pupils are equal, round, and reactive to light.  Neck: Normal range of motion. Neck supple. No thyromegaly present.  Cardiovascular: Normal rate, regular rhythm and intact distal pulses.  Murmur heard. Pulmonary/Chest: Effort normal and breath sounds normal. No respiratory distress. She has no wheezes.  Abdominal: Soft. Bowel sounds are normal. She exhibits no distension. There is no tenderness.  Musculoskeletal: She exhibits no edema or tenderness.  Pain in bilateral hip with rotation and flexion, worse in right hip. Generalized weakness, using two canes to walk  Neurological: She is alert and oriented to person, place, and time. She has normal reflexes. No cranial nerve deficit.  Skin: Skin is  warm and dry.  Psychiatric: She has a normal mood and affect. Her behavior is normal. Judgment and thought content normal.  Vitals reviewed.     BP 137/80   Pulse 77   Temp 98.3 F (36.8 C) (Oral)   Ht 5\' 4"  (1.626 m)   Wt 140 lb 6.4 oz (63.7 kg)   BMI 24.10 kg/m      Assessment & Plan:  Angela Wong comes in today with chief complaint of Hospitalization Follow-up   Diagnosis and orders addressed:  1. Osteoarthritis, unspecified osteoarthritis type, unspecified site - predniSONE (DELTASONE) 5 MG tablet; Take 1 tablet (5 mg total) by mouth daily.  Dispense: 90 tablet; Refill: 1 - HYDROcodone-acetaminophen (NORCO) 7.5-325 MG tablet; Take 1 tablet by mouth every 12 (twelve) hours as needed for moderate pain.  Dispense: 60 tablet; Refill: 0 - ondansetron (ZOFRAN) 4 MG tablet; Take 1 tablet (4 mg total) by mouth every 8 (eight) hours as needed for nausea or vomiting.  Dispense: 20 tablet; Refill: 0  2. Fibromyalgia - predniSONE (DELTASONE) 5 MG tablet; Take 1 tablet (5 mg total) by mouth daily.  Dispense: 90 tablet; Refill: 1 - HYDROcodone-acetaminophen (NORCO) 7.5-325 MG tablet; Take 1 tablet by mouth every 12 (twelve) hours as needed for moderate pain.  Dispense: 60 tablet; Refill: 0 - ondansetron (ZOFRAN) 4 MG tablet; Take 1 tablet (4 mg total) by mouth every 8 (eight) hours as needed  for nausea or vomiting.  Dispense: 20 tablet; Refill: 0  3. Chest pain, unspecified type  4. Congestive dilated cardiomyopathy ([place])  5. Atrial fibrillation, permanent ([place])  6. Generalized weakness  7. Hypothyroidism, unspecified type - levothyroxine (SYNTHROID, LEVOTHROID) 50 MCG tablet; Take 2 tablets (100 mcg total) by mouth daily.  Dispense: 180 tablet; Refill: 2   Labs reviewed Pt reviewed in [place] controlled database. No red flags noted.  Will give 30 days of Norco to see if will help with daily activities. Fall preventions discussed. PT will stop using two canes and start using  rolling walker that she has at home.   Follow up plan: 3 months    Angela Dun, FNP

## 2017-09-16 NOTE — Patient Instructions (Signed)

## 2017-09-17 LAB — SPECIMEN STATUS REPORT

## 2017-09-17 LAB — ROCKY MTN SPOTTED FVR ABS PNL(IGG+IGM)
RMSF IGM: 0.28 {index} (ref 0.00–0.89)
RMSF IgG: NEGATIVE

## 2017-09-22 NOTE — Progress Notes (Signed)
Aware of results. 

## 2017-09-26 DIAGNOSIS — M1612 Unilateral primary osteoarthritis, left hip: Secondary | ICD-10-CM | POA: Diagnosis not present

## 2017-09-26 DIAGNOSIS — M25551 Pain in right hip: Secondary | ICD-10-CM | POA: Diagnosis not present

## 2017-10-02 ENCOUNTER — Other Ambulatory Visit: Payer: Self-pay | Admitting: Family

## 2017-10-02 DIAGNOSIS — G4700 Insomnia, unspecified: Secondary | ICD-10-CM

## 2017-10-03 NOTE — Telephone Encounter (Signed)
Last seen 09/16/17  [name]

## 2017-10-04 DIAGNOSIS — M1611 Unilateral primary osteoarthritis, right hip: Secondary | ICD-10-CM | POA: Diagnosis not present

## 2017-10-04 DIAGNOSIS — M1612 Unilateral primary osteoarthritis, left hip: Secondary | ICD-10-CM | POA: Diagnosis not present

## 2017-10-07 ENCOUNTER — Ambulatory Visit (INDEPENDENT_AMBULATORY_CARE_PROVIDER_SITE_OTHER): Payer: Medicare Other | Admitting: *Deleted

## 2017-10-07 ENCOUNTER — Encounter: Payer: Self-pay | Admitting: *Deleted

## 2017-10-07 VITALS — BP 162/83 | Temp 96.0°F | Ht 64.0 in | Wt 141.0 lb

## 2017-10-07 DIAGNOSIS — Z0000 Encounter for general adult medical examination without abnormal findings: Secondary | ICD-10-CM

## 2017-10-07 NOTE — Patient Instructions (Addendum)
Please review the information given on Advance Directives, and if you complete these please bring a copy to our office to be filed in your medical record.   Please continue to work on your goal of improving your joint functioning to help improve your strength.   Thank you for coming in for your Annual Wellness Visit today!    Preventive Care 79 Years and Older, Female Preventive care refers to lifestyle choices and visits with your health care provider that can promote health and wellness. What does preventive care include?  A yearly physical exam. This is also called an annual well check.  Dental exams once or twice a year.  Routine eye exams. Ask your health care provider how often you should have your eyes checked.  Personal lifestyle choices, including: ? Daily care of your teeth and gums. ? Regular physical activity. ? Eating a healthy diet. ? Avoiding tobacco and drug use. ? Limiting alcohol use. ? Practicing safe sex. ? Taking low-dose aspirin every day. ? Taking vitamin and mineral supplements as recommended by your health care provider. What happens during an annual well check? The services and screenings done by your health care provider during your annual well check will depend on your age, overall health, lifestyle risk factors, and family history of disease. Counseling Your health care provider may ask you questions about your:  Alcohol use.  Tobacco use.  Drug use.  Emotional well-being.  Home and relationship well-being.  Sexual activity.  Eating habits.  History of falls.  Memory and ability to understand (cognition).  Work and work Statistician.  Reproductive health.  Screening You may have the following tests or measurements:  Height, weight, and BMI.  Blood pressure.  Lipid and cholesterol levels. These may be checked every 5 years, or more frequently if you are over 62 years old.  Skin check.  Lung cancer screening. You may have this  screening every year starting at age 22 if you have a 30-pack-year history of smoking and currently smoke or have quit within the past 15 years.  Fecal occult blood test (FOBT) of the stool. You may have this test every year starting at age 81.  Flexible sigmoidoscopy or colonoscopy. You may have a sigmoidoscopy every 5 years or a colonoscopy every 10 years starting at age 48.  Hepatitis C blood test.  Hepatitis B blood test.  Sexually transmitted disease (STD) testing.  Diabetes screening. This is done by checking your blood sugar (glucose) after you have not eaten for a while (fasting). You may have this done every 1-3 years.  Bone density scan. This is done to screen for osteoporosis. You may have this done starting at age 54.  Mammogram. This may be done every 1-2 years. Talk to your health care provider about how often you should have regular mammograms.  Talk with your health care provider about your test results, treatment options, and if necessary, the need for more tests. Vaccines Your health care provider may recommend certain vaccines, such as:  Influenza vaccine. This is recommended every year.  Tetanus, diphtheria, and acellular pertussis (Tdap, Td) vaccine. You may need a Td booster every 10 years.  Varicella vaccine. You may need this if you have not been vaccinated.  Zoster vaccine. You may need this after age 52.  Measles, mumps, and rubella (MMR) vaccine. You may need at least one dose of MMR if you were born in 1957 or later. You may also need a second dose.  Pneumococcal 13-valent  conjugate (PCV13) vaccine. One dose is recommended after age 4.  Pneumococcal polysaccharide (PPSV23) vaccine. One dose is recommended after age 5.  Meningococcal vaccine. You may need this if you have certain conditions.  Hepatitis A vaccine. You may need this if you have certain conditions or if you travel or work in places where you may be exposed to hepatitis A.  Hepatitis B  vaccine. You may need this if you have certain conditions or if you travel or work in places where you may be exposed to hepatitis B.  Haemophilus influenzae type b (Hib) vaccine. You may need this if you have certain conditions.  Talk to your health care provider about which screenings and vaccines you need and how often you need them. This information is not intended to replace advice given to you by your health care provider. Make sure you discuss any questions you have with your health care provider. Document Released: 01/27/2015 Document Revised: 09/20/2015 Document Reviewed: 11/01/2014 Elsevier Interactive Patient Education  2018 [place] in the Home Falls can cause injuries. They can happen to people of all ages. There are many things you can do to make your home safe and to help prevent falls. What can I do on the outside of my home?  Regularly fix the edges of walkways and driveways and fix any cracks.  Remove anything that might make you trip as you walk through a door, such as a raised step or threshold.  Trim any bushes or trees on the path to your home.  Use bright outdoor lighting.  Clear any walking paths of anything that might make someone trip, such as rocks or tools.  Regularly check to see if handrails are loose or broken. Make sure that both sides of any steps have handrails.  Any raised decks and porches should have guardrails on the edges.  Have any leaves, snow, or ice cleared regularly.  Use sand or salt on walking paths during winter.  Clean up any spills in your garage right away. This includes oil or grease spills. What can I do in the bathroom?  Use night lights.  Install grab bars by the toilet and in the tub and shower. Do not use towel bars as grab bars.  Use non-skid mats or decals in the tub or shower.  If you need to sit down in the shower, use a plastic, non-slip stool.  Keep the floor dry. Clean up any water that  spills on the floor as soon as it happens.  Remove soap buildup in the tub or shower regularly.  Attach bath mats securely with double-sided non-slip rug tape.  Do not have throw rugs and other things on the floor that can make you trip. What can I do in the bedroom?  Use night lights.  Make sure that you have a light by your bed that is easy to reach.  Do not use any sheets or blankets that are too big for your bed. They should not hang down onto the floor.  Have a firm chair that has side arms. You can use this for support while you get dressed.  Do not have throw rugs and other things on the floor that can make you trip. What can I do in the kitchen?  Clean up any spills right away.  Avoid walking on wet floors.  Keep items that you use a lot in easy-to-reach places.  If you need to reach something above you, use a  strong step stool that has a grab bar.  Keep electrical cords out of the way.  Do not use floor polish or wax that makes floors slippery. If you must use wax, use non-skid floor wax.  Do not have throw rugs and other things on the floor that can make you trip. What can I do with my stairs?  Do not leave any items on the stairs.  Make sure that there are handrails on both sides of the stairs and use them. Fix handrails that are broken or loose. Make sure that handrails are as long as the stairways.  Check any carpeting to make sure that it is firmly attached to the stairs. Fix any carpet that is loose or worn.  Avoid having throw rugs at the top or bottom of the stairs. If you do have throw rugs, attach them to the floor with carpet tape.  Make sure that you have a light switch at the top of the stairs and the bottom of the stairs. If you do not have them, ask someone to add them for you. What else can I do to help prevent falls?  Wear shoes that: ? Do not have high heels. ? Have rubber bottoms. ? Are comfortable and fit you well. ? Are closed at the  toe. Do not wear sandals.  If you use a stepladder: ? Make sure that it is fully opened. Do not climb a closed stepladder. ? Make sure that both sides of the stepladder are locked into place. ? Ask someone to hold it for you, if possible.  Clearly mark and make sure that you can see: ? Any grab bars or handrails. ? First and last steps. ? Where the edge of each step is.  Use tools that help you move around (mobility aids) if they are needed. These include: ? Canes. ? Walkers. ? Scooters. ? Crutches.  Turn on the lights when you go into a dark area. Replace any light bulbs as soon as they burn out.  Set up your furniture so you have a clear path. Avoid moving your furniture around.  If any of your floors are uneven, fix them.  If there are any pets around you, be aware of where they are.  Review your medicines with your doctor. Some medicines can make you feel dizzy. This can increase your chance of falling. Ask your doctor what other things that you can do to help prevent falls. This information is not intended to replace advice given to you by your health care provider. Make sure you discuss any questions you have with your health care provider. Document Released: 10/27/2008 Document Revised: 06/08/2015 Document Reviewed: 02/04/2014 Elsevier Interactive Patient Education  [employer].

## 2017-10-09 ENCOUNTER — Encounter: Payer: Self-pay | Admitting: *Deleted

## 2017-10-09 NOTE — Progress Notes (Addendum)
Subjective:   Angela Wong is a 77 y.o. female who presents for an Initial Medicare Annual Wellness Visit.  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  Angela Wong enjoys gardening, crafting, reading, and photography.  She moved from [place] to [place] in 2013 because she her children, and family home are here.  She lives alone and does not have pets.  Angela Wong has 3 sons, and 6 grandchildren.  She feels her health this year is worse than it was last year because her hip and knee pain is worse.  She recently underwent bilateral intraarticular hip joint injections.  She reports one ER visit and hospital admission in the past year due to chest pain, and no surgeries.   Review of Systems    Musculoskeletal- bilateral knee and hip pain All other systems negative         Objective:    Today's Vitals   10/07/17 1320  BP: (!) 162/83  Temp: (!) 96 F (35.6 C)  Weight: 141 lb (64 kg)  Height: 5\' 4"  (1.626 m)  PainSc: 5   PainLoc: Hip   Body mass index is 24.2 kg/m.  Advanced Directives 10/07/2017 08/25/2017 08/25/2017 08/08/2016 04/29/2016 03/29/2014  Does Patient Have a Medical Advance Directive? No No No Yes Yes Yes  Does patient want to make changes to medical advance directive? - No - Patient declined No - Patient declined - - -  Would patient like information on creating a medical advance directive? Yes (MAU/Ambulatory/Procedural Areas - Information given) No - Patient declined No - Patient declined - - -    Current Medications (verified) Outpatient Encounter Medications as of 10/07/2017  Medication Sig  . albuterol (PROVENTIL HFA;VENTOLIN HFA) 108 (90 Base) MCG/ACT inhaler Inhale 2 puffs into the lungs every 6 (six) hours as needed for wheezing.  Marland Kitchen atenolol (TENORMIN) 25 MG tablet Take 12.5 mg by mouth daily.  . B Complex-C (B-COMPLEX WITH VITAMIN C) tablet  Take 1 tablet by mouth daily.  . budesonide-formoterol (SYMBICORT) 160-4.5 MCG/ACT inhaler Inhale 2 puffs into the lungs 2 (two) times daily.  . bumetanide (BUMEX) 0.5 MG tablet Take 1 tablet (0.5 mg total) by mouth every other day.  . bumetanide (BUMEX) 1 MG tablet Take 1 tablet (1 mg total) by mouth every other day. On opposite day of 0.5mg  of bumetadnide  . Cholecalciferol (VITAMIN D) 2000 UNITS CAPS Take 2 capsules by mouth every evening.   . Cyanocobalamin (VITAMIN B 12 PO) Take 500 mg by mouth daily.  Marland Kitchen docusate sodium (COLACE) 100 MG capsule Take 400 mg by mouth daily.   Marland Kitchen esomeprazole (NEXIUM) 40 MG capsule Take 1 capsule (40 mg total) by mouth 2 (two) times daily before a meal.  . fexofenadine (ALLEGRA) 180 MG tablet Take 180 mg by mouth every morning.   . Iron Polysacch Cmplx-B12-FA (POLY-IRON 150 FORTE) 150-0.025-1 MG CAPS Take 1 tablet by mouth daily.  Marland Kitchen levothyroxine (SYNTHROID, LEVOTHROID) 50 MCG tablet Take 2 tablets (100 mcg total) by mouth daily.  Marland Kitchen losartan (COZAAR) 100 MG tablet TAKE 1 TABLET BY MOUTH ONCE DAILY (NEEDS  APPOINTMENT)  . Magnesium 200 MG TABS Take 2 tablets by mouth daily.   . metroNIDAZOLE (METROGEL) 1 % gel Apply topically daily.  . montelukast (SINGULAIR) 10 MG tablet TAKE 1 TABLET BY MOUTH ONCE DAILY AT BEDTIME  . Olopatadine HCl 0.2 %  SOLN Apply 1 drop to eye daily.  . predniSONE (DELTASONE) 5 MG tablet Take 1 tablet (5 mg total) by mouth daily.  . ranitidine (ZANTAC) 150 MG tablet Take 1 tablet (150 mg total) by mouth at bedtime.  . RESTASIS 0.05 % ophthalmic emulsion INSTILL 1 DROP INTO EACH EYE TWICE DAILY  . Simethicone (GAS-X EXTRA STRENGTH) 125 MG CAPS Take 2 capsules by mouth daily as needed (for relief).   . traZODone (DESYREL) 150 MG tablet TAKE 1 TABLET BY MOUTH AT BEDTIME  . warfarin (COUMADIN) 1 MG tablet TAKE 2 TO 4 TABLETS (= 2 TO 4MG ) BY MOUTH ONCE DAILY AS DIRECTED BY ANTICOGULATION CLINIC.  Marland Kitchen aspirin EC 81 MG EC tablet Take 1 tablet (81 mg  total) by mouth daily. (Patient not taking: Reported on 09/12/2017)  . cyclobenzaprine (FLEXERIL) 5 MG tablet Take 0.5 tablets (2.5 mg total) by mouth 2 (two) times daily as needed for muscle spasms. (Patient not taking: Reported on 10/07/2017)  . HYDROcodone-acetaminophen (NORCO) 7.5-325 MG tablet Take 1 tablet by mouth every 12 (twelve) hours as needed for moderate pain. (Patient not taking: Reported on 10/07/2017)  . ondansetron (ZOFRAN) 4 MG tablet Take 1 tablet (4 mg total) by mouth every 8 (eight) hours as needed for nausea or vomiting. (Patient not taking: Reported on 10/07/2017)  . PREVIDENT 5000 DRY MOUTH 1.1 % GEL dental gel Place 1 application onto teeth as needed (as needed for dry mouth).   . [DISCONTINUED] B Complex-C (B-COMPLEX WITH VITAMIN C) tablet Take 1 tablet by mouth daily.   No facility-administered encounter medications on file as of 10/07/2017.     Allergies (verified) Cephalosporins; Ciprofloxacin; Doxycycline; Horse-derived products; Ketek [telithromycin]; Nitrofuran derivatives; Nitrous oxide; Other; Penicillins; Pentazocine; Sulfa antibiotics; Trovan [alatrofloxacin]; Amlodipine; Calcium channel blockers; Carvedilol; Codeine; Cymbalta [duloxetine hcl]; Diltiazem; Diovan [valsartan]; Lisinopril; Sertraline; Tramadol; Zoloft [sertraline hcl]; and Loteprednol etabonate   History: Past Medical History:  Diagnosis Date  . A-fib ([place])   . Asthma   . Cerebral vasculitis   . COPD (chronic obstructive pulmonary disease) ([place])   . Fibromyalgia   . Gastric polyp   . GERD (gastroesophageal reflux disease)   . Hiatal hernia   . Hyperparathyroidism ([place])   . IBS (irritable bowel syndrome)   . MVP (mitral valve prolapse)   . Osteoarthritis   . Ovarian cancer ([place])    lymph node removal with hysterectomy  . Raynaud's disease   . RLS (restless legs syndrome)   . Situational depression   . Sjogren's syndrome ([place])   . Vasculitis ([place])   . Vitamin D deficiency    Past  Surgical History:  Procedure Laterality Date  . ABDOMINAL HYSTERECTOMY  1982   with right oophorectomy  . APPENDECTOMY    . BREAST BIOPSY Right    x 2  . BREAST SURGERY     Biopsy  . CARPAL TUNNEL RELEASE Right 1980  . CARPAL TUNNEL RELEASE Left 2010   x 2  . CATARACT EXTRACTION Bilateral   . CESAREAN SECTION     x 3  . KNEE SURGERY Left 2005  . OOPHORECTOMY Left 1962  . PACEMAKER INSERTION  09/15/2014  . REFRACTIVE SURGERY Bilateral 2014  . TUBAL LIGATION     Family History  Problem Relation Age of Onset  . COPD Mother   . Breast cancer Mother   . Allergic rhinitis Mother   . Heart disease Father        No details  . Kidney disease Father   .  Allergic rhinitis Father   . Stroke Sister   . Arthritis/Rheumatoid Sister   . Asthma Sister   . Lupus Sister   . Heart attack Sister   . Stroke Paternal Grandmother   . Scleroderma Grandchild   . Thyroid disease Other   . Breast cancer Maternal Aunt        x 2   Social History   Socioeconomic History  . Marital status: Single    Spouse name: Not on file  . Number of children: Not on file  . Years of education: Not on file  . Highest education level: Not on file  Occupational History  . Not on file  Social Needs  . Financial resource strain: Not on file  . Food insecurity:    Worry: Not on file    Inability: Not on file  . Transportation needs:    Medical: Not on file    Non-medical: Not on file  Tobacco Use  . Smoking status: Never Smoker  . Smokeless tobacco: Never Used  Substance and Sexual Activity  . Alcohol use: No  . Drug use: No  . Sexual activity: Not on file  Lifestyle  . Physical activity:    Days per week: Not on file    Minutes per session: Not on file  . Stress: Not on file  Relationships  . Social connections:    Talks on phone: Not on file    Gets together: Not on file    Attends religious service: Not on file    Active member of club or organization: Not on file    Attends meetings of  clubs or organizations: Not on file    Relationship status: Not on file  Other Topics Concern  . Not on file  Social History Narrative   Lives alone.  Moved from [place].     Caffeine- 6 cups daily, mix of caffeine/decaf   Children- 3   Retired Therapist, sports    Tobacco Counseling Counseling given: No   Clinical Intake:     Pain Score: 5                   Activities of Daily Living In your present state of health, do you have any difficulty performing the following activities: 10/07/2017 08/25/2017  Hearing? (No Data) N  Comment trouble with lower tones  -  Vision? Y N  Comment Has had cataract surgery, but still hard to read fine print even with glasses  -  Difficulty concentrating or making decisions? Y N  Comment Troulbe making decisions  -  Walking or climbing stairs? Y N  Comment Bilateral hip and knee pain -  Dressing or bathing? Y N  Comment Trouble bending  -  Doing errands, shopping? Y N  Comment Friend helps with grocery shopping due to hip and knee pain -  Preparing Food and eating ? N -  Using the Toilet? N -  In the past six months, have you accidently leaked urine? N -  Do you have problems with loss of bowel control? N -  Managing your Medications? N -  Managing your Finances? N -  Housekeeping or managing your Housekeeping? N -  Some recent data might be hidden     Immunizations and Health Maintenance Immunization History  Administered Date(s) Administered  . Influenza, High Dose Seasonal PF 10/09/2016  . Influenza-Unspecified 10/14/2012, 10/29/2013, 10/15/2015  . Pneumococcal Polysaccharide-23 10/29/2013  . Td 07/24/2009   There are no preventive care reminders  to display for this patient.  Patient Care Team: [name], FNP as PCP - General (Nurse Practitioner) Hilarie Fredrickson, Lajuan Lines, MD as Consulting Physician (Gastroenterology) Lahoma Rocker, MD as Consulting Physician (Rheumatology) Gaynelle Arabian, MD as Consulting Physician (Orthopedic  Surgery) Jarome Matin, MD as Consulting Physician (Dermatology) Penni Bombard, MD as Consulting Physician (Neurology) Ernst Bowler Gwenith Daily, MD as Consulting Physician (Allergy and Immunology)       Assessment:   This is a routine wellness examination for Jenetta.  Hearing/Vision screen Patient states she has trouble hearing lower tones, she is not interested in an audiology referral at this time. She has had bilateral cataracts removed.  Sees well generally, but has some trouble with fine print even with glasses  Dietary issues and exercise activities discussed:  Patient states she eats mostly whole foods- vegetables, fruits, nuts, salads, chicken.  She usually has an [place] muffin for breakfast.  She does eat 3 meals per day.  Current Exercise Habits: The patient does not participate in regular exercise at present, Exercise limited by: orthopedic condition(s)(hip and knee arthritis )  Goals    . Increase physical activity (pt-stated)     Patient states her goal is continue treatments on her hip joints to help improve pain and increase her overall strength and endurance.       Depression Screen PHQ 2/9 Scores 09/16/2017 09/12/2017 08/15/2017 08/01/2017 06/17/2017 05/20/2017 03/31/2017  PHQ - 2 Score 2 4 6  0 0 0 0  PHQ- 9 Score 9 12 14  - - - -    Fall Risk Fall Risk  10/09/2017 10/07/2017 09/16/2017 08/15/2017 08/01/2017  Falls in the past year? - Yes Yes Yes Yes  Comment - - - - -  Number falls in past yr: 1 - 2 or more 2 or more 2 or more  Injury with Fall? No - Yes - Yes  Comment - - - - -  Risk Factor Category  - - - - High Fall Risk  Risk for fall due to : Impaired mobility;Impaired balance/gait - - - -  Follow up Falls prevention discussed;Education provided - - - -  Comment - - - - -    Is the patient's home free of loose throw rugs in walkways, pet beds, electrical cords, etc?   no      Grab bars in the bathroom? yes      Handrails on the stairs?   yes      Adequate  lighting?   yes    Cognitive Function: MMSE - Mini Mental State Exam 10/09/2017  Orientation to time 5  Orientation to Place 5  Registration 3  Attention/ Calculation 4  Recall 3  Language- name 2 objects 2  Language- repeat 1  Language- follow 3 step command 3  Language- read & follow direction 1  Write a sentence 1  Copy design 1  Total score 29        Screening Tests Health Maintenance  Topic Date Due  . INFLUENZA VACCINE  02/15/2018 (Originally 08/14/2017)  . PNA vac Low Risk Adult (2 of 2 - PCV13) 10/08/2018 (Originally 10/30/2014)  . TETANUS/TDAP  07/25/2019  . DEXA SCAN  Completed    Prevnar 13 and flu vaccine declined today.  Qualifies for Shingles Vaccine? Yes, declined   Cancer Screenings: Lung: Low Dose CT Chest recommended if Age 67-80 years, 30 pack-year currently smoking OR have quit w/in 15years. Patient does not qualify. Breast: Up to date on Mammogram? No, scheduled for 10/13/17  Up to date of Bone Density/Dexa? Yes Colorectal: Due for colonoscopy 09/2017.  Patient has appointment with Dr. Hilarie Fredrickson 10/21/17.  His last office note from 03/2017 states he will discuss the risk versus benefit of her having the colonoscopy and stopping coumadin for a few days.   Additional Screenings:  Hepatitis C Screening:  Not indicated     Plan:     Review the information given on Advance Directives, and if you complete these please bring a copy to our office to be filed in your medical record.  Continue to work on your goal of improving your joint functioning to help improve your strength.    I have personally reviewed and noted the following in the patient's chart:   . Medical and social history . Use of alcohol, tobacco or illicit drugs  . Current medications and supplements . Functional ability and status . Nutritional status . Physical activity . Advanced directives . List of other physicians . Hospitalizations, surgeries, and ER visits in previous 12  months . Vitals . Screenings to include cognitive, depression, and falls . Referrals and appointments  In addition, I have reviewed and discussed with patient certain preventive protocols, quality metrics, and best practice recommendations. A written personalized care plan for preventive services as well as general preventive health recommendations were provided to patient.     WYATT, AMY M, RN   10/09/2017   I have reviewed and agree with the above AWV documentation.   [name], FNP

## 2017-10-13 ENCOUNTER — Other Ambulatory Visit: Payer: Self-pay | Admitting: Family

## 2017-10-13 DIAGNOSIS — Z1231 Encounter for screening mammogram for malignant neoplasm of breast: Secondary | ICD-10-CM | POA: Diagnosis not present

## 2017-10-13 LAB — HM MAMMOGRAPHY

## 2017-10-15 ENCOUNTER — Other Ambulatory Visit: Payer: Self-pay | Admitting: Family

## 2017-10-15 DIAGNOSIS — N6320 Unspecified lump in the left breast, unspecified quadrant: Secondary | ICD-10-CM

## 2017-10-21 ENCOUNTER — Ambulatory Visit (HOSPITAL_COMMUNITY)
Admission: RE | Admit: 2017-10-21 | Discharge: 2017-10-21 | Disposition: A | Discharge status: Home / Self Care | Payer: Medicare Other | Source: Ambulatory Visit | Attending: Family | Admitting: Family

## 2017-10-21 ENCOUNTER — Ambulatory Visit: Payer: Medicare Other | Admitting: Internal Medicine

## 2017-10-21 DIAGNOSIS — N6320 Unspecified lump in the left breast, unspecified quadrant: Secondary | ICD-10-CM

## 2017-10-21 DIAGNOSIS — N6323 Unspecified lump in the left breast, lower outer quadrant: Secondary | ICD-10-CM | POA: Diagnosis not present

## 2017-10-21 DIAGNOSIS — R922 Inconclusive mammogram: Secondary | ICD-10-CM | POA: Diagnosis not present

## 2017-10-22 ENCOUNTER — Encounter: Payer: Self-pay | Admitting: Internal Medicine

## 2017-10-22 ENCOUNTER — Ambulatory Visit (INDEPENDENT_AMBULATORY_CARE_PROVIDER_SITE_OTHER): Payer: Medicare Other | Admitting: Internal Medicine

## 2017-10-22 ENCOUNTER — Ambulatory Visit (INDEPENDENT_AMBULATORY_CARE_PROVIDER_SITE_OTHER): Payer: Medicare Other | Admitting: Pharmacist Clinician (PhC)/ Clinical Pharmacy Specialist

## 2017-10-22 VITALS — BP 124/82 | HR 68 | Ht 64.0 in | Wt 138.6 lb

## 2017-10-22 DIAGNOSIS — K219 Gastro-esophageal reflux disease without esophagitis: Secondary | ICD-10-CM | POA: Diagnosis not present

## 2017-10-22 DIAGNOSIS — Z86010 Personal history of colonic polyps: Secondary | ICD-10-CM

## 2017-10-22 DIAGNOSIS — I4821 Permanent atrial fibrillation: Secondary | ICD-10-CM | POA: Diagnosis not present

## 2017-10-22 DIAGNOSIS — K589 Irritable bowel syndrome without diarrhea: Secondary | ICD-10-CM | POA: Diagnosis not present

## 2017-10-22 LAB — COAGUCHEK XS/INR WAIVED
INR: 2.1 — AB (ref 0.9–1.1)
Prothrombin Time: 24.7 s

## 2017-10-22 MED ORDER — ESOMEPRAZOLE MAGNESIUM 40 MG PO CPDR: 40.0000 mg | DELAYED_RELEASE_CAPSULE | Freq: Every day | ORAL | 0 refills | Status: DC

## 2017-10-22 MED ORDER — FAMOTIDINE 20 MG PO TABS: 20.0000 mg | ORAL_TABLET | Freq: Every evening | ORAL | 2 refills | Status: DC

## 2017-10-22 NOTE — Patient Instructions (Signed)
Description   Continue warfarin dose of 4mg  on Mondays, Wednesdays and Fridays. Take 2mg  all other days.  INR was 2.1 today (goal is 2.0 to 3.0)  Perfect reading today

## 2017-10-22 NOTE — Patient Instructions (Addendum)
We have sent the following medications to your pharmacy for you to pick up at your convenience:  Pepcid 20 mg every evening (discontinue ranitidine)  Take Nexium only once daily  Please purchase the following medications over the counter and take as directed: IB Gard-2 capsules with each meal  Please follow up with Dr Levie Heritage in 3 months.  If you are age 78 or older, your body mass index should be between 23-30. Your Body mass index is 23.79 kg/m. If this is out of the aforementioned range listed, please consider follow up with your Primary Care Provider.  If you are age 63 or younger, your body mass index should be between 19-25. Your Body mass index is 23.79 kg/m. If this is out of the aformentioned range listed, please consider follow up with your Primary Care Provider.

## 2017-10-22 NOTE — Progress Notes (Signed)
Subjective:    Patient ID: Angela Wong, female    DOB: 08/17/1940, 77 y.o.   MRN: [id number]  HPI Angela Wong is a 77 year old female with a history of GERD, IBS, remote adenomatous colon polyps, atrial fibrillation on warfarin, COPD, Sjogren's syndrome, remote ovarian cancer status post hysterectomy and new nephrectomy, para hypothyroidism, fibromyalgia who is here for follow-up.  She was last seen on 04/04/2017.  She reports that she has had a very hard year.  She lost her sister, a close dear friend, her ex-husband as well as a long-term pet.  In August she had angina symptoms but fortunately did not have myocardial infarction.  She was having issues with hypertension at that time.  She remains in permanent atrial fibrillation and takes warfarin.  For the most part she feels that her reflux is fairly well controlled.  She complains of a dry throat and occasional nausea related to her medications.  Occasionally she will have bloating and gassiness with eating.  She is taking Nexium 40 mg once daily but stopped her second daily dose.  She is using ranitidine 150 mg most evenings.  No recent dysphagia or odynophagia.  Bowel movements have been regular though she will still have her irritable bowel type discomfort in her abdomen.  This is generalized discomfort crampiness and bloating type symptom.  No blood in her stool or melena.  M times definitively worsened by stress.  She is worried about having surveillance colonoscopy due to her age and stopping her chronic anticoagulation.  Review of records reveals last colonoscopy 09/25/2012.  Normal terminal ileum, multiple sigmoid diverticula, 6 mm sessile polyp removed from the sigmoid found to be hyperplastic.   Review of Systems As per HPI, otherwise negative  Current Medications, Allergies, Past Medical History, Past Surgical History, Family History and Social History were reviewed in [employer] record.       Objective:   Physical Exam BP 124/82   Pulse 68   Ht 5\' 4"  (1.626 m)   Wt 138 lb 9.6 oz (62.9 kg)   SpO2 97%   BMI 23.79 kg/m  Constitutional: Well-developed and well-nourished. No distress. HEENT: Normocephalic and atraumatic.  Conjunctivae are normal.  No scleral icterus. Neck: Neck supple. Trachea midline. Cardiovascular: Irregularly irregular, normal rate Pulmonary/chest: Effort normal and breath sounds normal. No wheezing, rales or rhonchi. Abdominal: Soft, nontender, nondistended. Bowel sounds active throughout.  Extremities: no clubbing, cyanosis, or edema Neurological: Alert and oriented to person place and time. Skin: Skin is warm and dry. Psychiatric: Normal mood and affect. Behavior is normal.        Assessment & Plan:  77 year old female with a history of GERD, IBS, remote adenomatous colon polyps, atrial fibrillation on warfarin, COPD, Sjogren's syndrome, remote ovarian cancer status post hysterectomy and new nephrectomy, para hypothyroidism, fibromyalgia who is here for follow-up.  1.  GERD --for the most part well controlled.  She stopped her second daily dose of Nexium and seems that she is doing well.  I will have her continue Nexium 40 mg once daily.  Change ranitidine to famotidine 20 mg in the evening.  10 you to avoid trigger foods such as coffee, acidic foods, chocolate.  2.  IBS --an ongoing issue, exacerbated by stress.  We will have her try IBgard, samples provided.  2 capsules with meals.  3.  History of colon polyps --remote adenomatous colon polyps though colonoscopy 5 years ago revealed hyperplastic polyps only.  This exam was complete.  We discussed how guidelines would now support a surveillance colonoscopy at the 10-year mark at which point she would be 77 years old.  Future surveillance colonoscopy will likely not provide additional health benefit based on age.  We discussed this today and she is in agreement.  I explained that this does not mean that  colonoscopy would not be done for diagnostic purposes should symptoms warrant.  She is happy with this plan.  Follow-up in about 3 months, sooner if needed  25 minutes spent with the patient today. Greater than 50% was spent in counseling and coordination of care with the patient

## 2017-10-27 ENCOUNTER — Ambulatory Visit (INDEPENDENT_AMBULATORY_CARE_PROVIDER_SITE_OTHER): Payer: Medicare Other | Admitting: *Deleted

## 2017-10-27 DIAGNOSIS — Z23 Encounter for immunization: Secondary | ICD-10-CM | POA: Diagnosis not present

## 2017-11-06 ENCOUNTER — Other Ambulatory Visit: Payer: Self-pay | Admitting: Family

## 2017-11-06 ENCOUNTER — Other Ambulatory Visit: Payer: Self-pay | Admitting: Family Medicine

## 2017-11-06 DIAGNOSIS — J45909 Unspecified asthma, uncomplicated: Secondary | ICD-10-CM

## 2017-11-07 DIAGNOSIS — M1611 Unilateral primary osteoarthritis, right hip: Secondary | ICD-10-CM | POA: Diagnosis not present

## 2017-11-07 DIAGNOSIS — M1612 Unilateral primary osteoarthritis, left hip: Secondary | ICD-10-CM | POA: Diagnosis not present

## 2017-11-16 DIAGNOSIS — Z45018 Encounter for adjustment and management of other part of cardiac pacemaker: Secondary | ICD-10-CM | POA: Diagnosis not present

## 2017-11-16 DIAGNOSIS — Z45010 Encounter for checking and testing of cardiac pacemaker pulse generator [battery]: Secondary | ICD-10-CM | POA: Diagnosis not present

## 2017-11-17 ENCOUNTER — Ambulatory Visit: Payer: Medicare Other | Admitting: Family

## 2017-11-26 ENCOUNTER — Ambulatory Visit (INDEPENDENT_AMBULATORY_CARE_PROVIDER_SITE_OTHER): Payer: Medicare Other | Admitting: Pharmacist Clinician (PhC)/ Clinical Pharmacy Specialist

## 2017-11-26 DIAGNOSIS — I4821 Permanent atrial fibrillation: Secondary | ICD-10-CM | POA: Diagnosis not present

## 2017-11-26 LAB — COAGUCHEK XS/INR WAIVED
INR: 1.7 — ABNORMAL HIGH (ref 0.9–1.1)
Prothrombin Time: 20.2 s

## 2017-11-26 NOTE — Patient Instructions (Signed)
Description   Take 5mg  today and 3mg  tomorrow then resume regular schedule:  warfarin dose of 4mg  on Mondays, Wednesdays and Fridays. Take 2mg  all other days.  INR was 1.7 today (goal is 2.0 to 3.0) slightly thick today

## 2017-12-16 ENCOUNTER — Other Ambulatory Visit: Payer: Self-pay | Admitting: Family

## 2017-12-16 DIAGNOSIS — H1013 Acute atopic conjunctivitis, bilateral: Secondary | ICD-10-CM

## 2017-12-18 ENCOUNTER — Ambulatory Visit (INDEPENDENT_AMBULATORY_CARE_PROVIDER_SITE_OTHER): Payer: Medicare Other | Admitting: Family

## 2017-12-18 ENCOUNTER — Encounter: Payer: Self-pay | Admitting: Family

## 2017-12-18 ENCOUNTER — Ambulatory Visit: Payer: Medicare Other | Admitting: Family

## 2017-12-18 VITALS — BP 147/79 | HR 66 | Temp 98.5°F | Ht 64.0 in | Wt 141.8 lb

## 2017-12-18 DIAGNOSIS — K219 Gastro-esophageal reflux disease without esophagitis: Secondary | ICD-10-CM

## 2017-12-18 DIAGNOSIS — I4821 Permanent atrial fibrillation: Secondary | ICD-10-CM

## 2017-12-18 DIAGNOSIS — E785 Hyperlipidemia, unspecified: Secondary | ICD-10-CM

## 2017-12-18 DIAGNOSIS — J4540 Moderate persistent asthma, uncomplicated: Secondary | ICD-10-CM

## 2017-12-18 DIAGNOSIS — I420 Dilated cardiomyopathy: Secondary | ICD-10-CM | POA: Diagnosis not present

## 2017-12-18 DIAGNOSIS — I10 Essential (primary) hypertension: Secondary | ICD-10-CM | POA: Diagnosis not present

## 2017-12-18 DIAGNOSIS — J3089 Other allergic rhinitis: Secondary | ICD-10-CM

## 2017-12-18 DIAGNOSIS — Z5181 Encounter for therapeutic drug level monitoring: Secondary | ICD-10-CM | POA: Insufficient documentation

## 2017-12-18 DIAGNOSIS — G4700 Insomnia, unspecified: Secondary | ICD-10-CM

## 2017-12-18 DIAGNOSIS — M1990 Unspecified osteoarthritis, unspecified site: Secondary | ICD-10-CM | POA: Diagnosis not present

## 2017-12-18 DIAGNOSIS — D509 Iron deficiency anemia, unspecified: Secondary | ICD-10-CM

## 2017-12-18 DIAGNOSIS — E039 Hypothyroidism, unspecified: Secondary | ICD-10-CM

## 2017-12-18 DIAGNOSIS — R6889 Other general symptoms and signs: Secondary | ICD-10-CM | POA: Diagnosis not present

## 2017-12-18 DIAGNOSIS — Z7901 Long term (current) use of anticoagulants: Secondary | ICD-10-CM | POA: Insufficient documentation

## 2017-12-18 DIAGNOSIS — F321 Major depressive disorder, single episode, moderate: Secondary | ICD-10-CM

## 2017-12-18 LAB — COAGUCHEK XS/INR WAIVED
INR: 1.7 — ABNORMAL HIGH (ref 0.9–1.1)
Prothrombin Time: 20.2 s

## 2017-12-18 MED ORDER — TRAZODONE HCL 150 MG PO TABS: 150.0000 mg | ORAL_TABLET | Freq: Every day | ORAL | 2 refills | Status: DC

## 2017-12-18 MED ORDER — METRONIDAZOLE 1 % EX GEL: Freq: Every day | CUTANEOUS | 0 refills | Status: DC

## 2017-12-18 NOTE — Progress Notes (Signed)
Subjective:    Patient ID: Angela Wong, female    DOB: 1940/12/26, 77 y.o.   MRN: [id number]  Chief Complaint  Patient presents with  . Annual Exam   Pt presents to the office today for chronic follow up. PT is followed by Cardiologists for CHF and A Fib that are stable at this time. She is taking warfarin. See anticoagulation flowsheet. Followed by Rheumatologists and Ortho for arthritis pain. Followed by Allergists for asthma and chronic allergies.  Followed by GI for GERD.  Hypertension  This is a chronic problem. The current episode started more than 1 year ago. The problem has been waxing and waning since onset. The problem is uncontrolled. Associated symptoms include malaise/fatigue, peripheral edema ("on and off") and shortness of breath. Risk factors for coronary artery disease include dyslipidemia, diabetes mellitus and sedentary lifestyle. Hypertensive end-organ damage includes heart failure. Identifiable causes of hypertension include a thyroid problem.  Asthma  She complains of cough, hoarse voice, shortness of breath and wheezing. This is a chronic problem. The current episode started more than 1 year ago. The problem occurs intermittently. Associated symptoms include heartburn and malaise/fatigue. She reports moderate improvement on treatment. Her past medical history is significant for asthma.  Arthritis  Presents for follow-up visit. She complains of pain and stiffness. The symptoms have been stable. Affected locations include the right knee, left knee, left hip, right hip, right MCP and left MCP (back). Her pain is at a severity of 7/10. Associated symptoms include fatigue.  Thyroid Problem  Presents for follow-up visit. Symptoms include constipation, fatigue and hoarse voice. The symptoms have been stable. Her past medical history is significant for heart failure and hyperlipidemia.  Hyperlipidemia  This is a chronic problem. The current episode started more than 1 year  ago. The problem is uncontrolled. Recent lipid tests were reviewed and are high. Associated symptoms include shortness of breath.  Anemia  Presents for follow-up visit. Symptoms include bruises/bleeds easily and malaise/fatigue. Past medical history includes heart failure.  Gastroesophageal Reflux  She complains of coughing, heartburn, a hoarse voice and wheezing. This is a chronic problem. The current episode started more than 1 year ago. The problem occurs frequently. Associated symptoms include fatigue. She has tried a histamine-2 antagonist for the symptoms. The treatment provided mild relief.  Cough  This is a recurrent problem. The current episode started 1 to 4 weeks ago. The problem has been unchanged. The problem occurs every few minutes. The cough is non-productive. Associated symptoms include heartburn, shortness of breath and wheezing. Her past medical history is significant for asthma.      Review of Systems  Constitutional: Positive for fatigue and malaise/fatigue.  HENT: Positive for hoarse voice.   Respiratory: Positive for cough, shortness of breath and wheezing.   Gastrointestinal: Positive for constipation and heartburn.  Musculoskeletal: Positive for arthritis and stiffness.  Hematological: Bruises/bleeds easily.  All other systems reviewed and are negative.      Objective:   Physical Exam  Constitutional: She is oriented to person, place, and time. She appears well-developed and well-nourished. No distress.  HENT:  Head: Normocephalic and atraumatic.  Right Ear: External ear normal.  Left Ear: External ear normal.  Mouth/Throat: Oropharynx is clear and moist.  Eyes: Pupils are equal, round, and reactive to light.  Neck: Normal range of motion. Neck supple. No thyromegaly present.  Cardiovascular: Normal rate, normal heart sounds and intact distal pulses. An irregular rhythm present.  No murmur heard. Pulmonary/Chest: Effort normal  and breath sounds normal. No  respiratory distress. She has no wheezes.  Abdominal: Soft. Bowel sounds are normal. She exhibits no distension. There is no tenderness.  Musculoskeletal: She exhibits edema (trace in RLE and 2+ in left ankle). She exhibits no tenderness.  Generalized weakness, using cane to walk, pain in lumbar with flexion and extension  Neurological: She is alert and oriented to person, place, and time. She has normal reflexes. No cranial nerve deficit.  Skin: Skin is warm and dry.  Psychiatric: She has a normal mood and affect. Her behavior is normal. Judgment and thought content normal.  Vitals reviewed.   BP (!) 147/79   Pulse 66   Temp 98.5 F (36.9 C) (Oral)   Ht '5\' 4"'  (1.626 m)   Wt 141 lb 12.8 oz (64.3 kg)   SpO2 98%   BMI 24.34 kg/m      Assessment & Plan:  Angela Wong comes in today with chief complaint of Annual Exam   Diagnosis and orders addressed:  1. Atrial fibrillation, permanent - CMP14+EGFR  2. Benign essential HTN - CMP14+EGFR  3. Congestive dilated cardiomyopathy (HCC) - CMP14+EGFR  4. Moderate persistent asthma, unspecified whether complicated - XVQ00+QQPY  5. Allergic rhinitis with a nonallergic component - CMP14+EGFR  6. Gastroesophageal reflux disease, esophagitis presence not specified - CMP14+EGFR  7. Hypothyroidism, unspecified type - CMP14+EGFR - TSH  8. Osteoarthritis, unspecified osteoarthritis type, unspecified site - CMP14+EGFR  9. Hyperlipidemia, unspecified hyperlipidemia type - CMP14+EGFR - Lipid panel  10. Iron deficiency anemia, unspecified iron deficiency anemia type - CMP14+EGFR - Anemia Profile B  11. Depression, major, single episode, moderate (HCC) - CMP14+EGFR  12. Anticoagulation goal of INR 2 to 3 Description   Take 75m today  then resume schedule:  warfarin dose of 443mon Mondays, Wednesdays, Fridays, and Saturday. Take 81m73mll other days.  INR was 1.7 today (goal is 2.0 to 3.0) slightly thick today      13.  Insomnia, unspecified type - traZODone (DESYREL) 150 MG tablet; Take 1 tablet (150 mg total) by mouth at bedtime.  Dispense: 90 tablet; Refill: 2  Labs pending Health Maintenance reviewed Diet and exercise encouraged  Follow up plan: 6 months    ChrEvelina DunNP

## 2017-12-18 NOTE — Patient Instructions (Signed)

## 2017-12-19 LAB — ANEMIA PROFILE B
BASOS: 1 %
Basophils Absolute: 0.1 10*3/uL (ref 0.0–0.2)
EOS (ABSOLUTE): 0 10*3/uL (ref 0.0–0.4)
EOS: 0 %
FERRITIN: 45 ng/mL (ref 15–150)
Folate: >20 ng/mL (ref >3.0)
HEMOGLOBIN: 14.1 g/dL (ref 11.1–15.9)
Hematocrit: 41.9 % (ref 34.0–46.6)
IRON SATURATION: 13 % — AB (ref 15–55)
Immature Grans (Abs): 0.1 10*3/uL (ref 0.0–0.1)
Immature Granulocytes: 1 %
Iron: 38 ug/dL (ref 27–139)
Lymphocytes Absolute: 1.3 10*3/uL (ref 0.7–3.1)
Lymphs: 13 %
MCH: 31.1 pg (ref 26.6–33.0)
MCHC: 33.7 g/dL (ref 31.5–35.7)
MCV: 93 fL (ref 79–97)
MONOCYTES: 6 %
MONOS ABS: 0.6 10*3/uL (ref 0.1–0.9)
NEUTROS ABS: 8.2 10*3/uL — AB (ref 1.4–7.0)
Neutrophils: 79 %
Platelets: 197 10*3/uL (ref 150–450)
RBC: 4.53 x10E6/uL (ref 3.77–5.28)
RDW: 12.7 % (ref 12.3–15.4)
RETIC CT PCT: 1.6 % (ref 0.6–2.6)
Total Iron Binding Capacity: 292 ug/dL (ref 250–450)
UIBC: 254 ug/dL (ref 118–369)
VITAMIN B 12: 1584 pg/mL — AB (ref 232–1245)
WBC: 10.3 10*3/uL (ref 3.4–10.8)

## 2017-12-19 LAB — LIPID PANEL
CHOL/HDL RATIO: 2.4 ratio (ref 0.0–4.4)
Cholesterol, Total: 205 mg/dL — ABNORMAL HIGH (ref 100–199)
HDL: 84 mg/dL (ref >39)
LDL Calculated: 102 mg/dL — ABNORMAL HIGH (ref 0–99)
Triglycerides: 96 mg/dL (ref 0–149)
VLDL CHOLESTEROL CAL: 19 mg/dL (ref 5–40)

## 2017-12-19 LAB — CMP14+EGFR
A/G RATIO: 2.1 (ref 1.2–2.2)
ALBUMIN: 4.1 g/dL (ref 3.5–4.8)
ALT: 20 IU/L (ref 0–32)
AST: 23 IU/L (ref 0–40)
Alkaline Phosphatase: 81 IU/L (ref 39–117)
BILIRUBIN TOTAL: 0.7 mg/dL (ref 0.0–1.2)
BUN / CREAT RATIO: 18 (ref 12–28)
BUN: 15 mg/dL (ref 8–27)
CO2: 25 mmol/L (ref 20–29)
Calcium: 9.6 mg/dL (ref 8.7–10.3)
Chloride: 99 mmol/L (ref 96–106)
Creatinine, Ser: 0.85 mg/dL (ref 0.57–1.00)
GFR calc non Af Amer: 66 mL/min/{1.73_m2} (ref >59)
GFR, EST AFRICAN AMERICAN: 76 mL/min/{1.73_m2} (ref >59)
GLUCOSE: 98 mg/dL (ref 65–99)
Globulin, Total: 2 g/dL (ref 1.5–4.5)
POTASSIUM: 3.9 mmol/L (ref 3.5–5.2)
SODIUM: 142 mmol/L (ref 134–144)
TOTAL PROTEIN: 6.1 g/dL (ref 6.0–8.5)

## 2017-12-19 LAB — TSH: TSH: 1.41 u[IU]/mL (ref 0.450–4.500)

## 2017-12-24 ENCOUNTER — Observation Stay (HOSPITAL_COMMUNITY): Payer: Medicare Other

## 2017-12-24 ENCOUNTER — Encounter (HOSPITAL_COMMUNITY): Payer: Self-pay | Admitting: Emergency Medicine

## 2017-12-24 ENCOUNTER — Other Ambulatory Visit: Payer: Self-pay

## 2017-12-24 ENCOUNTER — Emergency Department (HOSPITAL_COMMUNITY): Payer: Medicare Other

## 2017-12-24 ENCOUNTER — Emergency Department (HOSPITAL_COMMUNITY)
Admission: EM | Admit: 2017-12-24 | Discharge: 2017-12-25 | Disposition: A | Discharge status: Home / Self Care | Payer: Medicare Other | Source: Home / Self Care | Attending: Emergency Medicine | Admitting: Emergency Medicine

## 2017-12-24 ENCOUNTER — Observation Stay (HOSPITAL_COMMUNITY)
Admission: EM | Admit: 2017-12-24 | Discharge: 2017-12-24 | Disposition: A | Discharge status: Home / Self Care | Payer: Medicare Other | Attending: Internal Medicine | Admitting: Internal Medicine

## 2017-12-24 DIAGNOSIS — M797 Fibromyalgia: Secondary | ICD-10-CM | POA: Diagnosis not present

## 2017-12-24 DIAGNOSIS — I4821 Permanent atrial fibrillation: Secondary | ICD-10-CM | POA: Diagnosis present

## 2017-12-24 DIAGNOSIS — M47816 Spondylosis without myelopathy or radiculopathy, lumbar region: Secondary | ICD-10-CM | POA: Insufficient documentation

## 2017-12-24 DIAGNOSIS — R0789 Other chest pain: Secondary | ICD-10-CM | POA: Diagnosis not present

## 2017-12-24 DIAGNOSIS — I420 Dilated cardiomyopathy: Secondary | ICD-10-CM | POA: Insufficient documentation

## 2017-12-24 DIAGNOSIS — E785 Hyperlipidemia, unspecified: Secondary | ICD-10-CM | POA: Diagnosis present

## 2017-12-24 DIAGNOSIS — J449 Chronic obstructive pulmonary disease, unspecified: Secondary | ICD-10-CM | POA: Diagnosis not present

## 2017-12-24 DIAGNOSIS — Z7901 Long term (current) use of anticoagulants: Secondary | ICD-10-CM | POA: Diagnosis not present

## 2017-12-24 DIAGNOSIS — M5489 Other dorsalgia: Secondary | ICD-10-CM | POA: Diagnosis not present

## 2017-12-24 DIAGNOSIS — Z885 Allergy status to narcotic agent status: Secondary | ICD-10-CM | POA: Diagnosis not present

## 2017-12-24 DIAGNOSIS — M48061 Spinal stenosis, lumbar region without neurogenic claudication: Secondary | ICD-10-CM | POA: Insufficient documentation

## 2017-12-24 DIAGNOSIS — J4540 Moderate persistent asthma, uncomplicated: Secondary | ICD-10-CM | POA: Insufficient documentation

## 2017-12-24 DIAGNOSIS — Z880 Allergy status to penicillin: Secondary | ICD-10-CM | POA: Diagnosis not present

## 2017-12-24 DIAGNOSIS — M549 Dorsalgia, unspecified: Secondary | ICD-10-CM | POA: Diagnosis not present

## 2017-12-24 DIAGNOSIS — I7300 Raynaud's syndrome without gangrene: Secondary | ICD-10-CM | POA: Insufficient documentation

## 2017-12-24 DIAGNOSIS — K219 Gastro-esophageal reflux disease without esophagitis: Secondary | ICD-10-CM | POA: Diagnosis present

## 2017-12-24 DIAGNOSIS — Z7951 Long term (current) use of inhaled steroids: Secondary | ICD-10-CM | POA: Insufficient documentation

## 2017-12-24 DIAGNOSIS — K589 Irritable bowel syndrome without diarrhea: Secondary | ICD-10-CM | POA: Insufficient documentation

## 2017-12-24 DIAGNOSIS — M1990 Unspecified osteoarthritis, unspecified site: Secondary | ICD-10-CM | POA: Insufficient documentation

## 2017-12-24 DIAGNOSIS — Z882 Allergy status to sulfonamides status: Secondary | ICD-10-CM | POA: Diagnosis not present

## 2017-12-24 DIAGNOSIS — Z7952 Long term (current) use of systemic steroids: Secondary | ICD-10-CM | POA: Diagnosis not present

## 2017-12-24 DIAGNOSIS — R0781 Pleurodynia: Secondary | ICD-10-CM | POA: Diagnosis not present

## 2017-12-24 DIAGNOSIS — I10 Essential (primary) hypertension: Secondary | ICD-10-CM | POA: Diagnosis present

## 2017-12-24 DIAGNOSIS — E039 Hypothyroidism, unspecified: Secondary | ICD-10-CM | POA: Diagnosis present

## 2017-12-24 DIAGNOSIS — R109 Unspecified abdominal pain: Secondary | ICD-10-CM | POA: Diagnosis not present

## 2017-12-24 DIAGNOSIS — G4700 Insomnia, unspecified: Secondary | ICD-10-CM | POA: Insufficient documentation

## 2017-12-24 DIAGNOSIS — R079 Chest pain, unspecified: Principal | ICD-10-CM | POA: Diagnosis present

## 2017-12-24 DIAGNOSIS — Z950 Presence of cardiac pacemaker: Secondary | ICD-10-CM | POA: Insufficient documentation

## 2017-12-24 DIAGNOSIS — K5730 Diverticulosis of large intestine without perforation or abscess without bleeding: Secondary | ICD-10-CM | POA: Diagnosis not present

## 2017-12-24 DIAGNOSIS — G2581 Restless legs syndrome: Secondary | ICD-10-CM | POA: Insufficient documentation

## 2017-12-24 DIAGNOSIS — M3500 Sicca syndrome, unspecified: Secondary | ICD-10-CM | POA: Diagnosis present

## 2017-12-24 DIAGNOSIS — I341 Nonrheumatic mitral (valve) prolapse: Secondary | ICD-10-CM | POA: Diagnosis not present

## 2017-12-24 DIAGNOSIS — R0602 Shortness of breath: Secondary | ICD-10-CM | POA: Diagnosis not present

## 2017-12-24 LAB — TROPONIN I
Troponin I: <0.03 ng/mL (ref <0.03)
Troponin I: <0.03 ng/mL (ref <0.03)
Troponin I: <0.03 ng/mL (ref <0.03)

## 2017-12-24 LAB — CBC
HCT: 42.1 % (ref 36.0–46.0)
Hemoglobin: 13.5 g/dL (ref 12.0–15.0)
MCH: 30.2 pg (ref 26.0–34.0)
MCHC: 32.1 g/dL (ref 30.0–36.0)
MCV: 94.2 fL (ref 80.0–100.0)
Platelets: 170 10*3/uL (ref 150–400)
RBC: 4.47 MIL/uL (ref 3.87–5.11)
RDW: 12.9 % (ref 11.5–15.5)
WBC: 11 10*3/uL — ABNORMAL HIGH (ref 4.0–10.5)
nRBC: 0 % (ref 0.0–0.2)

## 2017-12-24 LAB — COMPREHENSIVE METABOLIC PANEL
ALBUMIN: 3.8 g/dL (ref 3.5–5.0)
ALK PHOS: 81 U/L (ref 38–126)
ALT: 33 U/L (ref 0–44)
AST: 21 U/L (ref 15–41)
Anion gap: 9 (ref 5–15)
BUN: 16 mg/dL (ref 8–23)
CHLORIDE: 104 mmol/L (ref 98–111)
CO2: 24 mmol/L (ref 22–32)
Calcium: 8.8 mg/dL — ABNORMAL LOW (ref 8.9–10.3)
Creatinine, Ser: 0.78 mg/dL (ref 0.44–1.00)
GFR calc Af Amer: >60 mL/min (ref >60)
GFR calc non Af Amer: >60 mL/min (ref >60)
Glucose, Bld: 136 mg/dL — ABNORMAL HIGH (ref 70–99)
Potassium: 2.9 mmol/L — ABNORMAL LOW (ref 3.5–5.1)
Sodium: 137 mmol/L (ref 135–145)
Total Bilirubin: 0.7 mg/dL (ref 0.3–1.2)
Total Protein: 6.3 g/dL — ABNORMAL LOW (ref 6.5–8.1)

## 2017-12-24 LAB — I-STAT CHEM 8, ED
BUN: 16 mg/dL (ref 8–23)
Calcium, Ion: 1.1 mmol/L — ABNORMAL LOW (ref 1.15–1.40)
Chloride: 101 mmol/L (ref 98–111)
Creatinine, Ser: 0.7 mg/dL (ref 0.44–1.00)
GLUCOSE: 131 mg/dL — AB (ref 70–99)
HCT: 39 % (ref 36.0–46.0)
HEMOGLOBIN: 13.3 g/dL (ref 12.0–15.0)
Potassium: 3 mmol/L — ABNORMAL LOW (ref 3.5–5.1)
Sodium: 138 mmol/L (ref 135–145)
TCO2: 27 mmol/L (ref 22–32)

## 2017-12-24 LAB — BASIC METABOLIC PANEL
Anion gap: 11 (ref 5–15)
BUN: 12 mg/dL (ref 8–23)
CHLORIDE: 100 mmol/L (ref 98–111)
CO2: 24 mmol/L (ref 22–32)
Calcium: 9.2 mg/dL (ref 8.9–10.3)
Creatinine, Ser: 0.79 mg/dL (ref 0.44–1.00)
GFR calc Af Amer: >60 mL/min (ref >60)
GFR calc non Af Amer: >60 mL/min (ref >60)
Glucose, Bld: 124 mg/dL — ABNORMAL HIGH (ref 70–99)
Potassium: 4.1 mmol/L (ref 3.5–5.1)
Sodium: 135 mmol/L (ref 135–145)

## 2017-12-24 LAB — MRSA PCR SCREENING: MRSA by PCR: NEGATIVE

## 2017-12-24 LAB — CBC WITH DIFFERENTIAL/PLATELET
Abs Immature Granulocytes: 0.03 10*3/uL (ref 0.00–0.07)
Basophils Absolute: 0 10*3/uL (ref 0.0–0.1)
Basophils Relative: 0 %
Eosinophils Absolute: 0.1 10*3/uL (ref 0.0–0.5)
Eosinophils Relative: 1 %
HCT: 40.6 % (ref 36.0–46.0)
HEMOGLOBIN: 13.3 g/dL (ref 12.0–15.0)
Immature Granulocytes: 0 %
Lymphocytes Relative: 17 %
Lymphs Abs: 1.5 10*3/uL (ref 0.7–4.0)
MCH: 31.1 pg (ref 26.0–34.0)
MCHC: 32.8 g/dL (ref 30.0–36.0)
MCV: 94.9 fL (ref 80.0–100.0)
Monocytes Absolute: 0.7 10*3/uL (ref 0.1–1.0)
Monocytes Relative: 8 %
Neutro Abs: 6.5 10*3/uL (ref 1.7–7.7)
Neutrophils Relative %: 74 %
Platelets: 181 10*3/uL (ref 150–400)
RBC: 4.28 MIL/uL (ref 3.87–5.11)
RDW: 13.2 % (ref 11.5–15.5)
WBC: 8.7 10*3/uL (ref 4.0–10.5)
nRBC: 0 % (ref 0.0–0.2)

## 2017-12-24 LAB — URINALYSIS, ROUTINE W REFLEX MICROSCOPIC
Bilirubin Urine: NEGATIVE
Glucose, UA: NEGATIVE mg/dL
Hgb urine dipstick: NEGATIVE
Ketones, ur: 20 mg/dL — AB
Leukocytes, UA: NEGATIVE
Nitrite: NEGATIVE
Protein, ur: NEGATIVE mg/dL
SPECIFIC GRAVITY, URINE: 1.043 — AB (ref 1.005–1.030)
pH: 7 (ref 5.0–8.0)

## 2017-12-24 LAB — PROTIME-INR
INR: 1.82
Prothrombin Time: 20.8 seconds — ABNORMAL HIGH (ref 11.4–15.2)

## 2017-12-24 LAB — I-STAT TROPONIN, ED
Troponin i, poc: 0.02 ng/mL (ref 0.00–0.08)
Troponin i, poc: 0.02 ng/mL (ref 0.00–0.08)

## 2017-12-24 LAB — BRAIN NATRIURETIC PEPTIDE: B Natriuretic Peptide: 465 pg/mL — ABNORMAL HIGH (ref 0.0–100.0)

## 2017-12-24 LAB — MAGNESIUM: Magnesium: 2.2 mg/dL (ref 1.7–2.4)

## 2017-12-24 MED ORDER — MORPHINE SULFATE (PF) 4 MG/ML IV SOLN
4.0000 mg | Freq: Once | INTRAVENOUS | Status: AC
  Administered 2017-12-25: 4 mg via INTRAVENOUS
  Filled 2017-12-24: qty 1

## 2017-12-24 MED ORDER — OXYCODONE-ACETAMINOPHEN 5-325 MG PO TABS: 1.0000 | ORAL_TABLET | Freq: Four times a day (QID) | ORAL | 0 refills | Status: DC | PRN

## 2017-12-24 MED ORDER — LORATADINE 10 MG PO TABS
10.0000 mg | ORAL_TABLET | Freq: Every day | ORAL | Status: DC
  Administered 2017-12-24: 10 mg via ORAL
  Filled 2017-12-24 (×2): qty 1

## 2017-12-24 MED ORDER — SIMETHICONE 125 MG PO CAPS: 2.0000 | ORAL_CAPSULE | Freq: Every day | ORAL | Status: DC | PRN

## 2017-12-24 MED ORDER — MORPHINE SULFATE (PF) 4 MG/ML IV SOLN
4.0000 mg | Freq: Once | INTRAVENOUS | Status: AC
  Administered 2017-12-24: 4 mg via INTRAVENOUS
  Filled 2017-12-24: qty 1

## 2017-12-24 MED ORDER — LEVOTHYROXINE SODIUM 100 MCG PO TABS
100.0000 ug | ORAL_TABLET | Freq: Every day | ORAL | Status: DC
  Administered 2017-12-24: 100 ug via ORAL
  Filled 2017-12-24: qty 2

## 2017-12-24 MED ORDER — MOMETASONE FURO-FORMOTEROL FUM 200-5 MCG/ACT IN AERO
2.0000 | INHALATION_SPRAY | Freq: Two times a day (BID) | RESPIRATORY_TRACT | Status: DC
  Administered 2017-12-24: 2 via RESPIRATORY_TRACT
  Filled 2017-12-24: qty 8.8

## 2017-12-24 MED ORDER — POTASSIUM CHLORIDE 10 MEQ/100ML IV SOLN
10.0000 meq | INTRAVENOUS | Status: AC
  Administered 2017-12-24 (×2): 10 meq via INTRAVENOUS
  Filled 2017-12-24 (×2): qty 100

## 2017-12-24 MED ORDER — KETOROLAC TROMETHAMINE 30 MG/ML IJ SOLN
15.0000 mg | Freq: Once | INTRAMUSCULAR | Status: AC
  Administered 2017-12-24: 15 mg via INTRAVENOUS

## 2017-12-24 MED ORDER — DOCUSATE SODIUM 100 MG PO CAPS
400.0000 mg | ORAL_CAPSULE | Freq: Every day | ORAL | Status: DC
  Administered 2017-12-24: 400 mg via ORAL
  Filled 2017-12-24: qty 4

## 2017-12-24 MED ORDER — B COMPLEX-C PO TABS
1.0000 | ORAL_TABLET | Freq: Every day | ORAL | Status: DC
  Administered 2017-12-24: 1 via ORAL
  Filled 2017-12-24 (×3): qty 1

## 2017-12-24 MED ORDER — WARFARIN SODIUM 2 MG PO TABS
4.0000 mg | ORAL_TABLET | Freq: Once | ORAL | Status: AC
  Administered 2017-12-24: 4 mg via ORAL
  Filled 2017-12-24: qty 2

## 2017-12-24 MED ORDER — MAGNESIUM OXIDE 400 (241.3 MG) MG PO TABS
400.0000 mg | ORAL_TABLET | Freq: Every day | ORAL | Status: DC
  Administered 2017-12-24: 400 mg via ORAL
  Filled 2017-12-24: qty 1

## 2017-12-24 MED ORDER — ONDANSETRON HCL 4 MG/2ML IJ SOLN
4.0000 mg | Freq: Once | INTRAMUSCULAR | Status: AC
  Administered 2017-12-24: 4 mg via INTRAVENOUS

## 2017-12-24 MED ORDER — VITAMIN D 25 MCG (1000 UNIT) PO TABS: 4000.0000 [IU] | ORAL_TABLET | Freq: Every evening | ORAL | Status: DC

## 2017-12-24 MED ORDER — LOSARTAN POTASSIUM 25 MG PO TABS
100.0000 mg | ORAL_TABLET | Freq: Every day | ORAL | Status: DC
  Administered 2017-12-24: 100 mg via ORAL
  Filled 2017-12-24: qty 4

## 2017-12-24 MED ORDER — PREDNISONE 20 MG PO TABS: 40.0000 mg | ORAL_TABLET | Freq: Every day | ORAL | Status: DC

## 2017-12-24 MED ORDER — TRAZODONE HCL 50 MG PO TABS: 150.0000 mg | ORAL_TABLET | Freq: Every day | ORAL | Status: DC

## 2017-12-24 MED ORDER — ATENOLOL 25 MG PO TABS
12.5000 mg | ORAL_TABLET | Freq: Every day | ORAL | Status: DC
  Administered 2017-12-24: 12.5 mg via ORAL
  Filled 2017-12-24: qty 1

## 2017-12-24 MED ORDER — BUMETANIDE 1 MG PO TABS
0.5000 mg | ORAL_TABLET | ORAL | Status: DC
  Administered 2017-12-24: 0.5 mg via ORAL

## 2017-12-24 MED ORDER — PREDNISONE 10 MG PO TABS: ORAL_TABLET | ORAL | 0 refills | Status: DC

## 2017-12-24 MED ORDER — LIDOCAINE 5 % EX PTCH
1.0000 | MEDICATED_PATCH | CUTANEOUS | Status: DC
  Administered 2017-12-24: 1 via TRANSDERMAL
  Filled 2017-12-24 (×2): qty 1

## 2017-12-24 MED ORDER — IOPAMIDOL (ISOVUE-370) INJECTION 76%
100.0000 mL | Freq: Once | INTRAVENOUS | Status: AC | PRN
  Administered 2017-12-24: 100 mL via INTRAVENOUS

## 2017-12-24 MED ORDER — WARFARIN - PHARMACIST DOSING INPATIENT
Freq: Every day | Status: DC
  Administered 2017-12-24: 16:00:00

## 2017-12-24 MED ORDER — OXYCODONE-ACETAMINOPHEN 5-325 MG PO TABS: 1.0000 | ORAL_TABLET | Freq: Four times a day (QID) | ORAL | Status: DC | PRN

## 2017-12-24 MED ORDER — KETOROLAC TROMETHAMINE 15 MG/ML IJ SOLN
INTRAMUSCULAR | Status: AC
  Filled 2017-12-24: qty 1

## 2017-12-24 MED ORDER — BUMETANIDE 1 MG PO TABS
1.0000 mg | ORAL_TABLET | ORAL | Status: DC
  Filled 2017-12-24: qty 1

## 2017-12-24 MED ORDER — MORPHINE SULFATE (PF) 2 MG/ML IV SOLN
2.0000 mg | INTRAVENOUS | Status: DC | PRN
  Administered 2017-12-24 (×2): 2 mg via INTRAVENOUS
  Filled 2017-12-24 (×2): qty 1

## 2017-12-24 MED ORDER — POTASSIUM CHLORIDE CRYS ER 20 MEQ PO TBCR
40.0000 meq | EXTENDED_RELEASE_TABLET | Freq: Once | ORAL | Status: AC
  Administered 2017-12-24: 40 meq via ORAL
  Filled 2017-12-24: qty 2

## 2017-12-24 MED ORDER — ONDANSETRON HCL 4 MG/2ML IJ SOLN: 4.0000 mg | Freq: Four times a day (QID) | INTRAMUSCULAR | Status: DC | PRN

## 2017-12-24 MED ORDER — ALBUTEROL SULFATE (2.5 MG/3ML) 0.083% IN NEBU: 3.0000 mL | INHALATION_SOLUTION | Freq: Four times a day (QID) | RESPIRATORY_TRACT | Status: DC | PRN

## 2017-12-24 MED ORDER — ONDANSETRON HCL 4 MG/2ML IJ SOLN
4.0000 mg | Freq: Once | INTRAMUSCULAR | Status: AC
  Administered 2017-12-24: 4 mg via INTRAVENOUS
  Filled 2017-12-24: qty 2

## 2017-12-24 MED ORDER — FAMOTIDINE 20 MG PO TABS: 20.0000 mg | ORAL_TABLET | Freq: Every evening | ORAL | Status: DC

## 2017-12-24 MED ORDER — SIMETHICONE 80 MG PO CHEW: 160.0000 mg | CHEWABLE_TABLET | Freq: Every day | ORAL | Status: DC | PRN

## 2017-12-24 MED ORDER — ONDANSETRON HCL 4 MG PO TABS
4.0000 mg | ORAL_TABLET | Freq: Four times a day (QID) | ORAL | Status: DC | PRN
  Administered 2017-12-24: 4 mg via ORAL
  Filled 2017-12-24: qty 1

## 2017-12-24 MED ORDER — CYCLOBENZAPRINE HCL 5 MG PO TABS
7.5000 mg | ORAL_TABLET | Freq: Three times a day (TID) | ORAL | Status: DC
  Administered 2017-12-24 (×2): 7.5 mg via ORAL
  Filled 2017-12-24 (×7): qty 1.5

## 2017-12-24 MED ORDER — MONTELUKAST SODIUM 10 MG PO TABS: 10.0000 mg | ORAL_TABLET | Freq: Every day | ORAL | Status: DC

## 2017-12-24 MED ORDER — CYCLOSPORINE 0.05 % OP EMUL
1.0000 [drp] | Freq: Two times a day (BID) | OPHTHALMIC | Status: DC
  Administered 2017-12-24: 1 [drp] via OPHTHALMIC
  Filled 2017-12-24 (×5): qty 1

## 2017-12-24 MED ORDER — PREDNISONE 10 MG PO TABS
5.0000 mg | ORAL_TABLET | Freq: Every day | ORAL | Status: DC
  Administered 2017-12-24: 5 mg via ORAL
  Filled 2017-12-24: qty 1

## 2017-12-24 MED ORDER — PANTOPRAZOLE SODIUM 40 MG PO TBEC
40.0000 mg | DELAYED_RELEASE_TABLET | Freq: Every day | ORAL | Status: DC
  Administered 2017-12-24: 40 mg via ORAL
  Filled 2017-12-24: qty 1

## 2017-12-24 MED ORDER — ONDANSETRON HCL 4 MG/2ML IJ SOLN
INTRAMUSCULAR | Status: AC
  Administered 2017-12-24: 4 mg via INTRAVENOUS
  Filled 2017-12-24: qty 2

## 2017-12-24 MED ORDER — LIDOCAINE 5 % EX PTCH: 1.0000 | MEDICATED_PATCH | CUTANEOUS | 0 refills | Status: DC

## 2017-12-24 MED ORDER — ONDANSETRON HCL 4 MG/2ML IJ SOLN
4.0000 mg | Freq: Once | INTRAMUSCULAR | Status: AC
  Administered 2017-12-25: 4 mg via INTRAVENOUS
  Filled 2017-12-24: qty 2

## 2017-12-24 MED ORDER — SODIUM CHLORIDE 0.9 % IV SOLN
INTRAVENOUS | Status: DC | PRN
  Administered 2017-12-24: 06:00:00 via INTRAVENOUS

## 2017-12-24 MED ORDER — SODIUM FLUORIDE 1.1 % DT GEL: 1.0000 "application " | DENTAL | Status: DC | PRN

## 2017-12-24 NOTE — Discharge Summary (Signed)
Physician Discharge Summary  Angela Wong JSH:[id number] DOB: 09/30/1940 DOA: 12/24/2017  PCP: [name], FNP  Admit date: 12/24/2017 Discharge date: 12/24/2017  Admitted From: Home Disposition: Home  Recommendations for Outpatient Follow-up:  1. Follow up with PCP in 1-2 weeks 2. Please obtain BMP/CBC in one week 3. Follow-up with rheumatologist next week as previously scheduled  Discharge Condition: Stable CODE STATUS: Full code Diet recommendation: Heart healthy  Brief/Interim Summary: 77 year old female with a history of Sjogren's syndrome, chronic atrial fib, hypertension, fibromyalgia, admitted to the hospital with complaints of chest discomfort.  Patient ruled out for ACS with negative cardiac markers.  She did not have any acute EKG changes.  Further imaging with CT angiogram did not indicate any pulmonary embolism.  She is anticoagulated with Coumadin.  She did not have any evidence of pneumonia on imaging.  Her pain appears to be very superficial, reproducible on palpation over her left rib.  She does not have any evidence of developing a rash.  I suspect that her pain may be neuropathic/musculoskeletal in origin.  She has been prescribed a lidocaine patch.  She is chronically on prednisone for Sjogren's syndrome and reports that her rheumatologist is considering possible rheumatoid arthritis as well.  These connective tissue disorders could certainly be playing a role in her presentation.  She will be placed on a prednisone taper.  She plans to follow-up with her rheumatologist next week.  No further work-up planned in the hospital.  She is felt safe to discharge.  Discharge Diagnoses:  Principal Problem:   Chest pain Active Problems:   Atrial fibrillation, permanent   GERD (gastroesophageal reflux disease)   Benign essential HTN   Hyperlipidemia   Fibromyalgia   Sjogren's syndrome (HCC)   Hypothyroidism    Discharge Instructions  Discharge Instructions     Diet - low sodium heart healthy   Complete by:  As directed    Increase activity slowly   Complete by:  As directed      Allergies as of 12/24/2017      Reactions   Cephalosporins Hives, Shortness Of Breath   Ciprofloxacin Hives, Shortness Of Breath   Doxycycline Hives, Shortness Of Breath   Horse-derived Products Anaphylaxis   Ketek [telithromycin] Palpitations   Chest discomfort   Nitrofuran Derivatives Anaphylaxis, Hives   blisters   Nitrous Oxide Nausea And Vomiting   Severe due to Sjogrens (Auto-Immune Disease)   Other Anaphylaxis   ALLERGY TO HORSE SERUM   Penicillins Hives, Shortness Of Breath   Pentazocine Other (See Comments)   Other reaction(s): Mental Status Changes (intolerance) Altered Mental Status  Altered Mental Status  Altered Mental Status    Sulfa Antibiotics Hives, Shortness Of Breath   Trovan [alatrofloxacin] Palpitations, Other (See Comments), Anaphylaxis   Chest pain, dizziness, irregular pulse   Amlodipine Swelling   Calcium Channel Blockers    Respiratory distress   Carvedilol Other (See Comments)   Dizziness, "joint pain, depression"   Codeine Nausea Only   Cymbalta [duloxetine Hcl] Swelling   Diltiazem    Swollen throat   Diovan [valsartan] Swelling   Lisinopril Swelling   Sertraline    Tramadol Nausea Only   Zoloft [sertraline Hcl]    Loteprednol Etabonate Rash      Medication List    STOP taking these medications   HYDROcodone-acetaminophen 7.5-325 MG tablet Commonly known as:  NORCO     TAKE these medications   albuterol 108 (90 Base) MCG/ACT inhaler Commonly known as:  PROVENTIL  HFA;VENTOLIN HFA Inhale 2 puffs into the lungs every 6 (six) hours as needed for wheezing.   atenolol 25 MG tablet Commonly known as:  TENORMIN Take 12.5 mg by mouth daily.   B-complex with vitamin C tablet Take 1 tablet by mouth daily.   budesonide-formoterol 160-4.5 MCG/ACT inhaler Commonly known as:  SYMBICORT Inhale 2 puffs into the lungs 2  (two) times daily.   bumetanide 1 MG tablet Commonly known as:  BUMEX TAKE 1 TABLET BY MOUTH EVERY OTHER DAY ON OPPOSITE DAY OF 0.5 MG DOSE   bumetanide 0.5 MG tablet Commonly known as:  BUMEX TAKE 1 TABLET BY MOUTH EVERY OTHER DAY   cyclobenzaprine 5 MG tablet Commonly known as:  FLEXERIL Take 0.5 tablets (2.5 mg total) by mouth 2 (two) times daily as needed for muscle spasms.   docusate sodium 100 MG capsule Commonly known as:  COLACE Take 400 mg by mouth daily.   esomeprazole 40 MG capsule Commonly known as:  NEXIUM Take 1 capsule (40 mg total) by mouth daily.   famotidine 20 MG tablet Commonly known as:  PEPCID Take 1 tablet (20 mg total) by mouth every evening.   fexofenadine 180 MG tablet Commonly known as:  ALLEGRA Take 180 mg by mouth every morning.   GAS-X EXTRA STRENGTH 125 MG Caps Generic drug:  Simethicone Take 2 capsules by mouth daily as needed (for relief).   Iron Polysacch Cmplx-B12-FA 150-0.025-1 MG Caps Take 1 tablet by mouth daily.   levothyroxine 50 MCG tablet Commonly known as:  SYNTHROID, LEVOTHROID Take 2 tablets (100 mcg total) by mouth daily.   lidocaine 5 % Commonly known as:  LIDODERM Place 1 patch onto the skin daily. Remove & Discard patch within 12 hours or as directed by MD   losartan 100 MG tablet Commonly known as:  COZAAR TAKE 1 TABLET BY MOUTH ONCE DAILY (NEEDS  APPOINTMENT)   Magnesium 200 MG Tabs Take 2 tablets by mouth daily.   metroNIDAZOLE 1 % gel Commonly known as:  METROGEL Apply topically daily.   montelukast 10 MG tablet Commonly known as:  SINGULAIR TAKE 1 TABLET BY MOUTH ONCE DAILY AT BEDTIME   Olopatadine HCl 0.2 % Soln INSTILL 1 DROP INTO AFFECTED EYE ONCE DAILY   ondansetron 4 MG tablet Commonly known as:  ZOFRAN Take 1 tablet (4 mg total) by mouth every 8 (eight) hours as needed for nausea or vomiting.   oxyCODONE-acetaminophen 5-325 MG tablet Commonly known as:  PERCOCET/ROXICET Take 1 tablet by  mouth every 6 (six) hours as needed for moderate pain.   predniSONE 10 MG tablet Commonly known as:  DELTASONE Take 40mg  po daily for 2 days then 30mg  daily for 2 days then 20mg  daily for 2 days then 10mg  daily for 2 days then 5mg  po daily What changed:    medication strength  how much to take  how to take this  when to take this  additional instructions   PREVIDENT 5000 DRY MOUTH 1.1 % Gel dental gel Generic drug:  sodium fluoride Place 1 application onto teeth as needed (as needed for dry mouth).   RESTASIS 0.05 % ophthalmic emulsion Generic drug:  cycloSPORINE INSTILL 1 DROP INTO EACH EYE TWICE DAILY   traZODone 150 MG tablet Commonly known as:  DESYREL Take 1 tablet (150 mg total) by mouth at bedtime.   VITAMIN B 12 PO Take 500 mg by mouth daily.   Vitamin D 50 MCG (2000 UT) Caps Take 2 capsules by mouth  every evening.   warfarin 1 MG tablet Commonly known as:  COUMADIN Take as directed. If you are unsure how to take this medication, talk to your nurse or doctor. Original instructions:  TAKE 2 TO 4 TABLETS (= 2 TO 4MG ) BY MOUTH ONCE DAILY AS DIRECTED BY ANTICOGULATION CLINIC.       Allergies  Allergen Reactions  . Cephalosporins Hives and Shortness Of Breath  . Ciprofloxacin Hives and Shortness Of Breath  . Doxycycline Hives and Shortness Of Breath  . Horse-Derived Products Anaphylaxis  . Ketek [Telithromycin] Palpitations    Chest discomfort  . Nitrofuran Derivatives Anaphylaxis and Hives    blisters  . Nitrous Oxide Nausea And Vomiting    Severe due to Sjogrens (Auto-Immune Disease)  . Other Anaphylaxis    ALLERGY TO HORSE SERUM  . Penicillins Hives and Shortness Of Breath  . Pentazocine Other (See Comments)    Other reaction(s): Mental Status Changes (intolerance) Altered Mental Status  Altered Mental Status  Altered Mental Status   . Sulfa Antibiotics Hives and Shortness Of Breath  . Trovan [Alatrofloxacin] Palpitations, Other (See Comments) and  Anaphylaxis    Chest pain, dizziness, irregular pulse  . Amlodipine Swelling  . Calcium Channel Blockers     Respiratory distress  . Carvedilol Other (See Comments)    Dizziness, "joint pain, depression"  . Codeine Nausea Only  . Cymbalta [Duloxetine Hcl] Swelling  . Diltiazem     Swollen throat   . Diovan [Valsartan] Swelling  . Lisinopril Swelling  . Sertraline   . Tramadol Nausea Only  . Zoloft [Sertraline Hcl]   . Loteprednol Etabonate Rash    Consultations:     Procedures/Studies: Dg Ribs Unilateral Left  Result Date: 12/24/2017 CLINICAL DATA:  Anterior left lower rib pain. EXAM: LEFT RIBS - 2 VIEW COMPARISON:  Chest radiograph and CTA 12/24/2017 FINDINGS: No left-sided rib fracture is identified. A single lead pacemaker is noted. The lungs were more fully evaluated on earlier chest radiograph and CT. IMPRESSION: No rib fracture identified. Electronically Signed   By: Logan Bores M.D.   On: 12/24/2017 09:11   Dg Chest Portable 1 View  Result Date: 12/24/2017 CLINICAL DATA:  Chest pain. EXAM: PORTABLE CHEST 1 VIEW COMPARISON:  08/25/2017. FINDINGS: Enlarged cardiac silhouette with an interval increase in size, accentuated by a decreased inspiration. Mild crowding of the pulmonary vasculature and interstitial markings due to the decreased inspiration. Otherwise, stable mild changes of COPD and chronic bronchitis. Stable right subclavian pacemaker lead with its tip at the right ventricular apex. No acute bony abnormality. IMPRESSION: 1. Mildly progressive cardiomegaly. 2. Stable mild changes of COPD and chronic bronchitis. Electronically Signed   By: Claudie Revering M.D.   On: 12/24/2017 02:52   Ct Angio Chest/abd/pel For Dissection W And/or Wo Contrast  Result Date: 12/24/2017 CLINICAL DATA:  77 y/o  F; 1 day of flank pain with nausea. EXAM: CT ANGIOGRAPHY CHEST, ABDOMEN AND PELVIS TECHNIQUE: Multidetector CT imaging through the chest, abdomen and pelvis was performed using  the standard protocol during bolus administration of intravenous contrast. Multiplanar reconstructed images and MIPs were obtained and reviewed to evaluate the vascular anatomy. CONTRAST:  197mL ISOVUE-370 IOPAMIDOL (ISOVUE-370) INJECTION 76% COMPARISON:  08/01/2017 thoracic spine radiographs. 08/25/2017 chest radiograph. FINDINGS: CTA CHEST FINDINGS Cardiovascular: Preferential opacification of the thoracic aorta. No evidence of thoracic aortic aneurysm or dissection. Moderate cardiomegaly. Single lead pacemaker with lead in the right ventricle. Mild coronary artery calcific atherosclerosis. Satisfactory opacification of pulmonary arteries, no  pulmonary embolus identified. Mediastinum/Nodes: Mild prominence of mediastinal lymph nodes. Patent central airways. Normal thyroid gland. Normal thoracic esophagus. Lungs/Pleura: Platelike atelectasis in the lung bases. Trace right pleural effusion. Musculoskeletal: Chronic T12 superior endplate fracture with mild loss of vertebral body height. Review of the MIP images confirms the above findings. CTA ABDOMEN AND PELVIS FINDINGS VASCULAR Aorta: Normal caliber aorta without aneurysm, dissection, vasculitis or significant stenosis. Celiac: Patent without evidence of aneurysm, dissection, vasculitis or significant stenosis. Separate origin of splenic and hepatic arteries. SMA: Patent without evidence of aneurysm, dissection, vasculitis or significant stenosis. Renals: Both renal arteries are patent without evidence of aneurysm, dissection, vasculitis, fibromuscular dysplasia or significant stenosis. IMA: Patent without evidence of aneurysm, dissection, vasculitis or significant stenosis. Inflow: Patent without evidence of aneurysm, dissection, vasculitis or significant stenosis. Veins: Multiple calcified phleboliths along the course of the right ovarian vein. Review of the MIP images confirms the above findings. NON-VASCULAR Hepatobiliary: 10 mm cyst within the right lobe of  liver. Otherwise no focal liver abnormality is seen. No gallstones, gallbladder wall thickening, or biliary dilatation. Pancreas: Unremarkable. No pancreatic ductal dilatation or surrounding inflammatory changes. Spleen: Normal in size without focal abnormality. Adrenals/Urinary Tract: Normal adrenal glands. Right kidney upper pole 17 mm and lower pole 11 mm cyst. Multiple segments of left kidney cortical scarring. No urinary stone disease or hydronephrosis. Normal bladder. Stomach/Bowel: Stomach is within normal limits. Appendix not identified, no pericecal inflammation. No evidence of bowel wall thickening, distention, or inflammatory changes. Pan colonic diverticulosis. No findings of acute diverticulitis. Lymphatic: Aortic atherosclerosis. No enlarged abdominal or pelvic lymph nodes. Reproductive: Status post hysterectomy. No adnexal masses. Other: No abdominal wall hernia or abnormality. No abdominopelvic ascites. Musculoskeletal: Mild right and moderate left hip joint osteoarthrosis with loss of the joint space, fibrocystic degeneration of articular surfaces, and osteophytosis. L4-5 grade 1 anterolisthesis with prominent facet arthropathy. Multifactorial severe L4-5 canal stenosis and moderate to severe L3-4 spinal canal stenosis. Review of the MIP images confirms the above findings. IMPRESSION: 1. No evidence of aortic aneurysm, dissection, or vasculitis. 2. No acute process identified as explanation for pain. 3. Trace right pleural effusion. Platelike atelectasis in the lung bases. 4. Moderate cardiomegaly. 5. Pan colonic diverticulosis without evidence of acute diverticulitis. 6. Lumbar spondylosis with severe L4-5 canal stenosis and moderate to severe L3-4 spinal canal stenosis. Electronically Signed   By: Kristine Garbe M.D.   On: 12/24/2017 03:56      Subjective: Continues to have left-sided rib pain that radiates through to her back.  Worse with palpation  Discharge Exam: Vitals:    12/24/17 0600 12/24/17 0752 12/24/17 1149 12/24/17 1246  BP: (!) 114/52     Pulse: (!) 57     Resp: 14 10 14    Temp:  98.8 F (37.1 C) 97.9 F (36.6 C)   TempSrc:  Oral Oral   SpO2: 95%   95%  Weight:  65.2 kg    Height:  5\' 4"  (1.626 m)      General: Pt is alert, awake, not in acute distress Cardiovascular: RRR, S1/S2 +, no rubs, no gallops Respiratory: CTA bilaterally, no wheezing, no rhonchi Abdominal: Soft, NT, ND, bowel sounds + Extremities: no edema, no cyanosis    The results of significant diagnostics from this hospitalization (including imaging, microbiology, ancillary and laboratory) are listed below for reference.     Microbiology: Recent Results (from the past 240 hour(s))  MRSA PCR Screening     Status: None   Collection Time: 12/24/17  7:34 AM  Result Value Ref Range Status   MRSA by PCR NEGATIVE NEGATIVE Final    Comment:        The GeneXpert MRSA Assay (FDA approved for NASAL specimens only), is one component of a comprehensive MRSA colonization surveillance program. It is not intended to diagnose MRSA infection nor to guide or monitor treatment for MRSA infections. Performed at [facility], [address] [place], [place] [address]      Labs: BNP (last 3 results) Recent Labs    12/24/17 0224  BNP 240.9*   Basic Metabolic Panel: Recent Labs  Lab 12/18/17 1526 12/24/17 0224 12/24/17 0232  NA 142 137 138  K 3.9 2.9* 3.0*  CL 99 104 101  CO2 25 24  --   GLUCOSE 98 136* 131*  BUN 15 16 16   CREATININE 0.85 0.78 0.70  CALCIUM 9.6 8.8*  --   MG  --  2.2  --    Liver Function Tests: Recent Labs  Lab 12/18/17 1526 12/24/17 0224  AST 23 21  ALT 20 33  ALKPHOS 81 81  BILITOT 0.7 0.7  PROT 6.1 6.3*  ALBUMIN 4.1 3.8   No results for input(s): LIPASE, AMYLASE in the last 168 hours. No results for input(s): AMMONIA in the last 168 hours. CBC: Recent Labs  Lab 12/18/17 1526 12/24/17 0224 12/24/17 0232  WBC 10.3 8.7  --    NEUTROABS 8.2* 6.5  --   HGB 14.1 13.3 13.3  HCT 41.9 40.6 39.0  MCV 93 94.9  --   PLT 197 181  --    Cardiac Enzymes: Recent Labs  Lab 12/24/17 0224 12/24/17 0548 12/24/17 1203  TROPONINI <0.03 <0.03 <0.03   BNP: Invalid input(s): POCBNP CBG: No results for input(s): GLUCAP in the last 168 hours. D-Dimer No results for input(s): DDIMER in the last 72 hours. Hgb A1c No results for input(s): HGBA1C in the last 72 hours. Lipid Profile No results for input(s): CHOL, HDL, LDLCALC, TRIG, CHOLHDL, LDLDIRECT in the last 72 hours. Thyroid function studies No results for input(s): TSH, T4TOTAL, T3FREE, THYROIDAB in the last 72 hours.  Invalid input(s): FREET3 Anemia work up No results for input(s): VITAMINB12, FOLATE, FERRITIN, TIBC, IRON, RETICCTPCT in the last 72 hours. Urinalysis    Component Value Date/Time   COLORURINE AMBER (A) 12/24/2017 0225   APPEARANCEUR HAZY (A) 12/24/2017 0225   APPEARANCEUR Clear 04/18/2016 1008   LABSPEC 1.043 (H) 12/24/2017 0225   PHURINE 7.0 12/24/2017 0225   GLUCOSEU NEGATIVE 12/24/2017 0225   HGBUR NEGATIVE 12/24/2017 0225   BILIRUBINUR NEGATIVE 12/24/2017 0225   BILIRUBINUR Negative 04/18/2016 1008   KETONESUR 20 (A) 12/24/2017 0225   PROTEINUR NEGATIVE 12/24/2017 0225   UROBILINOGEN negative 11/26/2013 1238   NITRITE NEGATIVE 12/24/2017 0225   LEUKOCYTESUR NEGATIVE 12/24/2017 0225   LEUKOCYTESUR Negative 04/18/2016 1008   Sepsis Labs Invalid input(s): PROCALCITONIN,  WBC,  LACTICIDVEN Microbiology Recent Results (from the past 240 hour(s))  MRSA PCR Screening     Status: None   Collection Time: 12/24/17  7:34 AM  Result Value Ref Range Status   MRSA by PCR NEGATIVE NEGATIVE Final    Comment:        The GeneXpert MRSA Assay (FDA approved for NASAL specimens only), is one component of a comprehensive MRSA colonization surveillance program. It is not intended to diagnose MRSA infection nor to guide or monitor treatment  for MRSA infections. Performed at [facility], [address], [place], [place] [address]  Time coordinating discharge: 52mins  SIGNED:   Kathie Dike, MD  [facility] 12/24/2017, 7:25 PM Pager   If 7PM-7AM, please contact night-coverage [email or web address] Password [email or web address]

## 2017-12-24 NOTE — H&P (Signed)
TRH H&P    Patient Demographics:    Angela Wong, is a 77 y.o. female  MRN: [id number]  DOB - 12/03/1940  Admit Date - 12/24/2017  Referring MD/NP/PA: Dr. Wyvonnia Dusky  Outpatient Primary MD for the patient is [name], FNP  Patient coming from: Home  Chief complaint-left-sided chest pain   HPI:    Angela Wong  is a 77 y.o. female, with history of atrial fibrillation, sick sinus syndrome status post pacemaker placement, COPD, hypothyroidism, Sjogren's syndrome on chronic prednisone therapy, dilated cardiomyopathy who came to hospital after patient had left-sided rib/chest pain which started last evening.  Patient says that the pain did not improve and got worse.  She denies any fall or trauma.  The pain is worse with movement and deep breathing.  She did apply CBD oil without relief.  She admits to having some shortness of breath with pain.  Also had nausea but no vomiting.  Patient does have history of osteo-arthritis. She denies abdominal pain. Denies dysuria. In the ED CT chest was done which was negative for PE.  Showed bibasilar atelectasis Potassium was found to be low, 3.0 Cardiac enzymes were negative.    Review of systems:    In addition to the HPI above,    All other systems reviewed and are negative.    Past History of the following :    Past Medical History:  Diagnosis Date  . A-fib ([place])   . Asthma   . Cerebral vasculitis   . Congestive dilated cardiomyopathy ([place])   . COPD (chronic obstructive pulmonary disease) ([place])   . Fibromyalgia   . Gastric polyp   . GERD (gastroesophageal reflux disease)   . Hiatal hernia   . Hyperparathyroidism ([place])   . IBS (irritable bowel syndrome)   . MVP (mitral valve prolapse)   . Osteoarthritis   . Ovarian cancer ([place])    lymph node removal with hysterectomy  . Raynaud's disease   . RLS (restless legs syndrome)   . Situational  depression   . Sjogren's syndrome ([place])   . Vasculitis ([place])   . Vitamin D deficiency       Past Surgical History:  Procedure Laterality Date  . ABDOMINAL HYSTERECTOMY  1982   with right oophorectomy  . APPENDECTOMY    . BREAST BIOPSY Right    x 2  . BREAST SURGERY     Biopsy  . CARPAL TUNNEL RELEASE Right 1980  . CARPAL TUNNEL RELEASE Left 2010   x 2  . CATARACT EXTRACTION Bilateral   . CESAREAN SECTION     x 3  . KNEE SURGERY Left 2005  . OOPHORECTOMY Left 1962  . PACEMAKER INSERTION  09/15/2014  . REFRACTIVE SURGERY Bilateral 2014  . TUBAL LIGATION        Social History:      Social History   Tobacco Use  . Smoking status: Never Smoker  . Smokeless tobacco: Never Used  Substance Use Topics  . Alcohol use: No       Family History :  Family History  Problem Relation Age of Onset  . COPD Mother   . Breast cancer Mother   . Allergic rhinitis Mother   . Heart disease Father        No details  . Kidney disease Father   . Allergic rhinitis Father   . Stroke Sister   . Arthritis/Rheumatoid Sister   . Asthma Sister   . Lupus Sister   . Heart attack Sister   . Stroke Paternal Grandmother   . Scleroderma Grandchild   . Thyroid disease Other   . Breast cancer Maternal Aunt        x 2      Home Medications:   Prior to Admission medications   Medication Sig Start Date End Date Taking? Authorizing Provider  albuterol (PROVENTIL HFA;VENTOLIN HFA) 108 (90 Base) MCG/ACT inhaler Inhale 2 puffs into the lungs every 6 (six) hours as needed for wheezing. 10/01/16   Bobbitt, Sedalia Muta, MD  atenolol (TENORMIN) 25 MG tablet Take 12.5 mg by mouth daily. 03/11/16   [provider]  B Complex-C (B-COMPLEX WITH VITAMIN C) tablet Take 1 tablet by mouth daily.    [provider]  budesonide-formoterol (SYMBICORT) 160-4.5 MCG/ACT inhaler Inhale 2 puffs into the lungs 2 (two) times daily. 05/13/17   [name] A, FNP  bumetanide (BUMEX) 0.5 MG  tablet TAKE 1 TABLET BY MOUTH EVERY OTHER DAY 11/07/17   [name] A, FNP  bumetanide (BUMEX) 1 MG tablet TAKE 1 TABLET BY MOUTH EVERY OTHER DAY ON OPPOSITE DAY OF 0.5 MG DOSE 11/07/17   [name] A, FNP  Cholecalciferol (VITAMIN D) 2000 UNITS CAPS Take 2 capsules by mouth every evening.     [provider]  Cyanocobalamin (VITAMIN B 12 PO) Take 500 mg by mouth daily.    [provider]  cyclobenzaprine (FLEXERIL) 5 MG tablet Take 0.5 tablets (2.5 mg total) by mouth 2 (two) times daily as needed for muscle spasms. Patient not taking: Reported on 10/22/2017 08/01/17   Eustaquio Maize, MD  docusate sodium (COLACE) 100 MG capsule Take 400 mg by mouth daily.     [provider]  esomeprazole (NEXIUM) 40 MG capsule Take 1 capsule (40 mg total) by mouth daily. 10/22/17   Pyrtle, Lajuan Lines, MD  famotidine (PEPCID) 20 MG tablet Take 1 tablet (20 mg total) by mouth every evening. 10/22/17   Pyrtle, Lajuan Lines, MD  fexofenadine (ALLEGRA) 180 MG tablet Take 180 mg by mouth every morning.     [provider]  HYDROcodone-acetaminophen (NORCO) 7.5-325 MG tablet Take 1 tablet by mouth every 12 (twelve) hours as needed for moderate pain. 09/16/17   [name], FNP  Iron Polysacch Cmplx-B12-FA (POLY-IRON 150 FORTE) 150-0.025-1 MG CAPS Take 1 tablet by mouth daily. 03/07/17   Memory Argue, PharmD  levothyroxine (SYNTHROID, LEVOTHROID) 50 MCG tablet Take 2 tablets (100 mcg total) by mouth daily. 09/16/17   [name] A, FNP  losartan (COZAAR) 100 MG tablet TAKE 1 TABLET BY MOUTH ONCE DAILY (NEEDS  APPOINTMENT) 10/14/17   [name] A, FNP  Magnesium 200 MG TABS Take 2 tablets by mouth daily.     [provider]  metroNIDAZOLE (METROGEL) 1 % gel Apply topically daily. 12/18/17   [name] A, FNP  montelukast (SINGULAIR) 10 MG tablet TAKE 1 TABLET BY MOUTH ONCE DAILY AT BEDTIME 11/07/17   Hawks, Christy A, FNP  Olopatadine HCl 0.2 % SOLN INSTILL 1 DROP INTO  AFFECTED EYE ONCE  DAILY 12/16/17   [name] A, FNP  ondansetron (ZOFRAN) 4 MG tablet Take 1 tablet (4 mg total) by mouth every 8 (eight) hours as needed for nausea or vomiting. 09/16/17   [name] A, FNP  predniSONE (DELTASONE) 5 MG tablet Take 1 tablet (5 mg total) by mouth daily. 09/16/17   Hawks, Christy A, FNP  PREVIDENT 5000 DRY MOUTH 1.1 % GEL dental gel Place 1 application onto teeth as needed (as needed for dry mouth).  10/13/13   [provider]  RESTASIS 0.05 % ophthalmic emulsion INSTILL 1 DROP INTO EACH EYE TWICE DAILY 10/03/17   [name] A, FNP  Simethicone (GAS-X EXTRA STRENGTH) 125 MG CAPS Take 2 capsules by mouth daily as needed (for relief).     [provider]  traZODone (DESYREL) 150 MG tablet Take 1 tablet (150 mg total) by mouth at bedtime. 12/18/17   [name] A, FNP  warfarin (COUMADIN) 1 MG tablet TAKE 2 TO 4 TABLETS (= 2 TO 4MG ) BY MOUTH ONCE DAILY AS DIRECTED BY ANTICOGULATION CLINIC. 03/07/17   Memory Argue, PharmD     Allergies:     Allergies  Allergen Reactions  . Cephalosporins Hives and Shortness Of Breath  . Ciprofloxacin Hives and Shortness Of Breath  . Doxycycline Hives and Shortness Of Breath  . Horse-Derived Products Anaphylaxis  . Ketek [Telithromycin] Palpitations    Chest discomfort  . Nitrofuran Derivatives Anaphylaxis and Hives    blisters  . Nitrous Oxide Nausea And Vomiting    Severe due to Sjogrens (Auto-Immune Disease)  . Other Anaphylaxis    ALLERGY TO HORSE SERUM  . Penicillins Hives and Shortness Of Breath  . Pentazocine Other (See Comments)    Other reaction(s): Mental Status Changes (intolerance) Altered Mental Status  Altered Mental Status  Altered Mental Status   . Sulfa Antibiotics Hives and Shortness Of Breath  . Trovan [Alatrofloxacin] Palpitations, Other (See Comments) and Anaphylaxis    Chest pain, dizziness, irregular pulse  . Amlodipine Swelling  . Calcium Channel Blockers      Respiratory distress  . Carvedilol Other (See Comments)    Dizziness, "joint pain, depression"  . Codeine Nausea Only  . Cymbalta [Duloxetine Hcl] Swelling  . Diltiazem     Swollen throat   . Diovan [Valsartan] Swelling  . Lisinopril Swelling  . Sertraline   . Tramadol Nausea Only  . Zoloft [Sertraline Hcl]   . Loteprednol Etabonate Rash     Physical Exam:   Vitals  Blood pressure (!) 156/73, pulse 84, temperature 97.9 F (36.6 C), resp. rate 16, height 5\' 7"  (1.702 m), weight 62.1 kg, SpO2 95 %.  1.  General: Appears in no acute distress  2. Psychiatric:  Intact judgement and  insight, awake alert, oriented x 3.  3. Neurologic: No focal neurological deficits, all cranial nerves intact.Strength 5/5 all 4 extremities, sensation intact all 4 extremities, plantars down going.  4. Eyes :  anicteric sclerae, moist conjunctivae with no lid lag. PERRLA.  5. ENMT:  Oropharynx clear with moist mucous membranes and good dentition  6. Neck:  supple, no cervical lymphadenopathy appriciated, No thyromegaly  7. Respiratory : Normal respiratory effort, good air movement bilaterally,clear to  auscultation bilaterally  8. Cardiovascular : RRR, no gallops, rubs or murmurs, no leg edema  9. Gastrointestinal:  Positive bowel sounds, abdomen soft, non-tender to palpation,no hepatosplenomegaly, no rigidity or guarding       10. Skin:  No cyanosis, normal texture and turgor, no  rash, lesions or ulcers  11.Musculoskeletal:  Tenderness noted at the left 10th 11th 12th ribs, worse on deep breathing.    Data Review:    CBC Recent Labs  Lab 12/18/17 1526 12/24/17 0224 12/24/17 0232  WBC 10.3 8.7  --   HGB 14.1 13.3 13.3  HCT 41.9 40.6 39.0  PLT 197 181  --   MCV 93 94.9  --   MCH 31.1 31.1  --   MCHC 33.7 32.8  --   RDW 12.7 13.2  --   LYMPHSABS 1.3 1.5  --   MONOABS  --  0.7  --   EOSABS 0.0 0.1  --   BASOSABS 0.1 0.0  --     ------------------------------------------------------------------------------------------------------------------  Results for orders placed or performed during the hospital encounter of 12/24/17 (from the past 48 hour(s))  CBC with Differential/Platelet     Status: None   Collection Time: 12/24/17  2:24 AM  Result Value Ref Range   WBC 8.7 4.0 - 10.5 K/uL   RBC 4.28 3.87 - 5.11 MIL/uL   Hemoglobin 13.3 12.0 - 15.0 g/dL   HCT 40.6 36.0 - 46.0 %   MCV 94.9 80.0 - 100.0 fL   MCH 31.1 26.0 - 34.0 pg   MCHC 32.8 30.0 - 36.0 g/dL   RDW 13.2 11.5 - 15.5 %   Platelets 181 150 - 400 K/uL   nRBC 0.0 0.0 - 0.2 %   Neutrophils Relative % 74 %   Neutro Abs 6.5 1.7 - 7.7 K/uL   Lymphocytes Relative 17 %   Lymphs Abs 1.5 0.7 - 4.0 K/uL   Monocytes Relative 8 %   Monocytes Absolute 0.7 0.1 - 1.0 K/uL   Eosinophils Relative 1 %   Eosinophils Absolute 0.1 0.0 - 0.5 K/uL   Basophils Relative 0 %   Basophils Absolute 0.0 0.0 - 0.1 K/uL   Immature Granulocytes 0 %   Abs Immature Granulocytes 0.03 0.00 - 0.07 K/uL    Comment: Performed at [facility], [address], [place], [place] [address]  Comprehensive metabolic panel     Status: Abnormal   Collection Time: 12/24/17  2:24 AM  Result Value Ref Range   Sodium 137 135 - 145 mmol/L   Potassium 2.9 (L) 3.5 - 5.1 mmol/L   Chloride 104 98 - 111 mmol/L   CO2 24 22 - 32 mmol/L   Glucose, Bld 136 (H) 70 - 99 mg/dL   BUN 16 8 - 23 mg/dL   Creatinine, Ser 0.78 0.44 - 1.00 mg/dL   Calcium 8.8 (L) 8.9 - 10.3 mg/dL   Total Protein 6.3 (L) 6.5 - 8.1 g/dL   Albumin 3.8 3.5 - 5.0 g/dL   AST 21 15 - 41 U/L   ALT 33 0 - 44 U/L   Alkaline Phosphatase 81 38 - 126 U/L   Total Bilirubin 0.7 0.3 - 1.2 mg/dL   GFR calc non Af Amer >60 >60 mL/min   GFR calc Af Amer >60 >60 mL/min   Anion gap 9 5 - 15    Comment: Performed at [facility], [address], [place], [place] [address]  Troponin I - ONCE - STAT     Status: None   Collection Time: 12/24/17   2:24 AM  Result Value Ref Range   Troponin I <0.03 <0.03 ng/mL    Comment: Performed at [facility], [address], [place], [place] [address]  Brain natriuretic peptide     Status: Abnormal  Collection Time: 12/24/17  2:24 AM  Result Value Ref Range   B Natriuretic Peptide 465.0 (H) 0.0 - 100.0 pg/mL    Comment: Performed at [facility], [address], [place], [place] [address]  Protime-INR     Status: Abnormal   Collection Time: 12/24/17  2:24 AM  Result Value Ref Range   Prothrombin Time 20.8 (H) 11.4 - 15.2 seconds   INR 1.82     Comment: Performed at [facility], [address] [place], [place] [address]  Urinalysis, Routine w reflex microscopic     Status: Abnormal   Collection Time: 12/24/17  2:25 AM  Result Value Ref Range   Color, Urine AMBER (A) YELLOW    Comment: BIOCHEMICALS MAY BE AFFECTED BY COLOR   APPearance HAZY (A) CLEAR   Specific Gravity, Urine 1.043 (H) 1.005 - 1.030   pH 7.0 5.0 - 8.0   Glucose, UA NEGATIVE NEGATIVE mg/dL   Hgb urine dipstick NEGATIVE NEGATIVE   Bilirubin Urine NEGATIVE NEGATIVE   Ketones, ur 20 (A) NEGATIVE mg/dL   Protein, ur NEGATIVE NEGATIVE mg/dL   Nitrite NEGATIVE NEGATIVE   Leukocytes, UA NEGATIVE NEGATIVE    Comment: Performed at [facility], [address] [place], [place] [address]  I-stat chem 8, ed     Status: Abnormal   Collection Time: 12/24/17  2:32 AM  Result Value Ref Range   Sodium 138 135 - 145 mmol/L   Potassium 3.0 (L) 3.5 - 5.1 mmol/L   Chloride 101 98 - 111 mmol/L   BUN 16 8 - 23 mg/dL   Creatinine, Ser 0.70 0.44 - 1.00 mg/dL   Glucose, Bld 131 (H) 70 - 99 mg/dL   Calcium, Ion 1.10 (L) 1.15 - 1.40 mmol/L   TCO2 27 22 - 32 mmol/L   Hemoglobin 13.3 12.0 - 15.0 g/dL   HCT 39.0 36.0 - 46.0 %  I-stat troponin, ED     Status: None   Collection Time: 12/24/17  2:52 AM  Result Value Ref Range   Troponin i, poc 0.02 0.00 - 0.08 ng/mL   Comment 3            Comment: Due to the release kinetics of cTnI, a  negative result within the first hours of the onset of symptoms does not rule out myocardial infarction with certainty. If myocardial infarction is still suspected, repeat the test at appropriate intervals.     Chemistries  Recent Labs  Lab 12/18/17 1526 12/24/17 0224 12/24/17 0232  NA 142 137 138  K 3.9 2.9* 3.0*  CL 99 104 101  CO2 25 24  --   GLUCOSE 98 136* 131*  BUN 15 16 16   CREATININE 0.85 0.78 0.70  CALCIUM 9.6 8.8*  --   AST 23 21  --   ALT 20 33  --   ALKPHOS 81 81  --   BILITOT 0.7 0.7  --    ------------------------------------------------------------------------------------------------------------------  ------------------------------------------------------------------------------------------------------------------ GFR: Estimated Creatinine Clearance: 56.4 mL/min (by C-G formula based on SCr of 0.7 mg/dL). Liver Function Tests: Recent Labs  Lab 12/18/17 1526 12/24/17 0224  AST 23 21  ALT 20 33  ALKPHOS 81 81  BILITOT 0.7 0.7  PROT 6.1 6.3*  ALBUMIN 4.1 3.8   No results for input(s): LIPASE, AMYLASE in the last 168 hours. No results for input(s): AMMONIA in the last 168 hours. Coagulation Profile: Recent Labs  Lab 12/18/17 1521 12/24/17 0224  INR 1.7* 1.82   Cardiac Enzymes: Recent Labs  Lab 12/24/17 0224  TROPONINI <0.03   BNP (last 3 results) No results for input(s): PROBNP in the last 8760 hours. HbA1C: No results for input(s): HGBA1C in the last 72 hours. CBG: No results for input(s): GLUCAP in the last 168 hours. Lipid Profile: No results for input(s): CHOL, HDL, LDLCALC, TRIG, CHOLHDL, LDLDIRECT in the last 72 hours. Thyroid Function Tests: No results for input(s): TSH, T4TOTAL, FREET4, T3FREE, THYROIDAB in the last 72 hours. Anemia Panel: No results for input(s): VITAMINB12, FOLATE, FERRITIN, TIBC, IRON, RETICCTPCT in the last 72  hours.  --------------------------------------------------------------------------------------------------------------- Urine analysis:    Component Value Date/Time   COLORURINE AMBER (A) 12/24/2017 0225   APPEARANCEUR HAZY (A) 12/24/2017 0225   APPEARANCEUR Clear 04/18/2016 1008   LABSPEC 1.043 (H) 12/24/2017 0225   PHURINE 7.0 12/24/2017 0225   GLUCOSEU NEGATIVE 12/24/2017 0225   HGBUR NEGATIVE 12/24/2017 0225   BILIRUBINUR NEGATIVE 12/24/2017 0225   BILIRUBINUR Negative 04/18/2016 1008   KETONESUR 20 (A) 12/24/2017 0225   PROTEINUR NEGATIVE 12/24/2017 0225   UROBILINOGEN negative 11/26/2013 1238   NITRITE NEGATIVE 12/24/2017 0225   LEUKOCYTESUR NEGATIVE 12/24/2017 0225   LEUKOCYTESUR Negative 04/18/2016 1008      Imaging Results:    Dg Chest Portable 1 View  Result Date: 12/24/2017 CLINICAL DATA:  Chest pain. EXAM: PORTABLE CHEST 1 VIEW COMPARISON:  08/25/2017. FINDINGS: Enlarged cardiac silhouette with an interval increase in size, accentuated by a decreased inspiration. Mild crowding of the pulmonary vasculature and interstitial markings due to the decreased inspiration. Otherwise, stable mild changes of COPD and chronic bronchitis. Stable right subclavian pacemaker lead with its tip at the right ventricular apex. No acute bony abnormality. IMPRESSION: 1. Mildly progressive cardiomegaly. 2. Stable mild changes of COPD and chronic bronchitis. Electronically Signed   By: Claudie Revering M.D.   On: 12/24/2017 02:52   Ct Angio Chest/abd/pel For Dissection W And/or Wo Contrast  Result Date: 12/24/2017 CLINICAL DATA:  77 y/o  F; 1 day of flank pain with nausea. EXAM: CT ANGIOGRAPHY CHEST, ABDOMEN AND PELVIS TECHNIQUE: Multidetector CT imaging through the chest, abdomen and pelvis was performed using the standard protocol during bolus administration of intravenous contrast. Multiplanar reconstructed images and MIPs were obtained and reviewed to evaluate the vascular anatomy. CONTRAST:   147mL ISOVUE-370 IOPAMIDOL (ISOVUE-370) INJECTION 76% COMPARISON:  08/01/2017 thoracic spine radiographs. 08/25/2017 chest radiograph. FINDINGS: CTA CHEST FINDINGS Cardiovascular: Preferential opacification of the thoracic aorta. No evidence of thoracic aortic aneurysm or dissection. Moderate cardiomegaly. Single lead pacemaker with lead in the right ventricle. Mild coronary artery calcific atherosclerosis. Satisfactory opacification of pulmonary arteries, no pulmonary embolus identified. Mediastinum/Nodes: Mild prominence of mediastinal lymph nodes. Patent central airways. Normal thyroid gland. Normal thoracic esophagus. Lungs/Pleura: Platelike atelectasis in the lung bases. Trace right pleural effusion. Musculoskeletal: Chronic T12 superior endplate fracture with mild loss of vertebral body height. Review of the MIP images confirms the above findings. CTA ABDOMEN AND PELVIS FINDINGS VASCULAR Aorta: Normal caliber aorta without aneurysm, dissection, vasculitis or significant stenosis. Celiac: Patent without evidence of aneurysm, dissection, vasculitis or significant stenosis. Separate origin of splenic and hepatic arteries. SMA: Patent without evidence of aneurysm, dissection, vasculitis or significant stenosis. Renals: Both renal arteries are patent without evidence of aneurysm, dissection, vasculitis, fibromuscular dysplasia or significant stenosis. IMA: Patent without evidence of aneurysm, dissection, vasculitis or significant stenosis. Inflow: Patent without evidence of aneurysm, dissection, vasculitis or significant stenosis. Veins: Multiple calcified phleboliths along the course of the right ovarian vein. Review of the MIP images  confirms the above findings. NON-VASCULAR Hepatobiliary: 10 mm cyst within the right lobe of liver. Otherwise no focal liver abnormality is seen. No gallstones, gallbladder wall thickening, or biliary dilatation. Pancreas: Unremarkable. No pancreatic ductal dilatation or surrounding  inflammatory changes. Spleen: Normal in size without focal abnormality. Adrenals/Urinary Tract: Normal adrenal glands. Right kidney upper pole 17 mm and lower pole 11 mm cyst. Multiple segments of left kidney cortical scarring. No urinary stone disease or hydronephrosis. Normal bladder. Stomach/Bowel: Stomach is within normal limits. Appendix not identified, no pericecal inflammation. No evidence of bowel wall thickening, distention, or inflammatory changes. Pan colonic diverticulosis. No findings of acute diverticulitis. Lymphatic: Aortic atherosclerosis. No enlarged abdominal or pelvic lymph nodes. Reproductive: Status post hysterectomy. No adnexal masses. Other: No abdominal wall hernia or abnormality. No abdominopelvic ascites. Musculoskeletal: Mild right and moderate left hip joint osteoarthrosis with loss of the joint space, fibrocystic degeneration of articular surfaces, and osteophytosis. L4-5 grade 1 anterolisthesis with prominent facet arthropathy. Multifactorial severe L4-5 canal stenosis and moderate to severe L3-4 spinal canal stenosis. Review of the MIP images confirms the above findings. IMPRESSION: 1. No evidence of aortic aneurysm, dissection, or vasculitis. 2. No acute process identified as explanation for pain. 3. Trace right pleural effusion. Platelike atelectasis in the lung bases. 4. Moderate cardiomegaly. 5. Pan colonic diverticulosis without evidence of acute diverticulitis. 6. Lumbar spondylosis with severe L4-5 canal stenosis and moderate to severe L3-4 spinal canal stenosis. Electronically Signed   By: Kristine Garbe M.D.   On: 12/24/2017 03:56    My personal review of EKG: Rhythm atrial fibrillation   Assessment & Plan:    Active Problems:   Chest pain   1. Chest pain-appears musculoskeletal in origin.  CTA negative for aortic dissection or vasculitis, will start Flexeril 7.5 mg 3 times daily, morphine 2 mg IV every 4 hours as needed.  Will monitor patient on  telemetry, obtain serial cardiac enzymes.  2. Hypokalemia-potassium is 3.0, will replace potassium and check serum magnesium.  3. Rib pain-we will obtain rib series to rule out underlying rib fracture.  4. Atrial fibrillation-chronic, status post pacemaker placement, heart rate is controlled.  Continue warfarin per pharmacy.  5. Dilated cardiomyopathy-last echo from August 2019 showed EF 50%, diffuse hypokinesis.  Patient is on Bumex 1 mg alternating with 0.5 mg every other day.  We will continue with Bumex.  6. Sjogren's syndrome-continue prednisone, Restasis   DVT Prophylaxis-   warfarin  AM Labs Ordered, also please review Full Orders  Family Communication: Admission, patients condition and plan of care including tests being ordered have been discussed with the patient  who indicate understanding and agree with the plan and Code Status.  Code Status: Full code  Admission status: Observation: Based on patients clinical presentation and evaluation of above clinical data, I have made determination that patient will need less than 2 midnights for her hospital stay.  Patient admitted for pain control, chest pain rule out.    Time spent in minutes : 60 minutes   Oswald Hillock M.D on 12/24/2017 at 5:30 AM  Between 7am to 7pm - Pager - [phone]. After 7pm go to [email or web address] - password [facility]   [facility] - Office  [phone]

## 2017-12-24 NOTE — Progress Notes (Signed)
ANTICOAGULATION CONSULT NOTE - Initial Consult  Pharmacy Consult for Coumadin Indication: atrial fibrillation  Allergies  Allergen Reactions  . Cephalosporins Hives and Shortness Of Breath  . Ciprofloxacin Hives and Shortness Of Breath  . Doxycycline Hives and Shortness Of Breath  . Horse-Derived Products Anaphylaxis  . Ketek [Telithromycin] Palpitations    Chest discomfort  . Nitrofuran Derivatives Anaphylaxis and Hives    blisters  . Nitrous Oxide Nausea And Vomiting    Severe due to Sjogrens (Auto-Immune Disease)  . Other Anaphylaxis    ALLERGY TO HORSE SERUM  . Penicillins Hives and Shortness Of Breath  . Pentazocine Other (See Comments)    Other reaction(s): Mental Status Changes (intolerance) Altered Mental Status  Altered Mental Status  Altered Mental Status   . Sulfa Antibiotics Hives and Shortness Of Breath  . Trovan [Alatrofloxacin] Palpitations, Other (See Comments) and Anaphylaxis    Chest pain, dizziness, irregular pulse  . Amlodipine Swelling  . Calcium Channel Blockers     Respiratory distress  . Carvedilol Other (See Comments)    Dizziness, "joint pain, depression"  . Codeine Nausea Only  . Cymbalta [Duloxetine Hcl] Swelling  . Diltiazem     Swollen throat   . Diovan [Valsartan] Swelling  . Lisinopril Swelling  . Sertraline   . Tramadol Nausea Only  . Zoloft [Sertraline Hcl]   . Loteprednol Etabonate Rash    Patient Measurements: Height: 5\' 4"  (162.6 cm) Weight: 143 lb 11.8 oz (65.2 kg) IBW/kg (Calculated) : 54.7  Vital Signs: Temp: 98.8 F (37.1 C) (12/11 0752) Temp Source: Oral (12/11 0752) BP: 114/52 (12/11 0600) Pulse Rate: 57 (12/11 0600)  Labs: Recent Labs    12/24/17 0224 12/24/17 0232 12/24/17 0548  HGB 13.3 13.3  --   HCT 40.6 39.0  --   PLT 181  --   --   LABPROT 20.8*  --   --   INR 1.82  --   --   CREATININE 0.78 0.70  --   TROPONINI <0.03  --  <0.03    Estimated Creatinine Clearance: 50 mL/min (by C-G formula based  on SCr of 0.7 mg/dL).   Medical History: Past Medical History:  Diagnosis Date  . A-fib ([place])   . Asthma   . Cerebral vasculitis   . Congestive dilated cardiomyopathy ([place])   . COPD (chronic obstructive pulmonary disease) ([place])   . Fibromyalgia   . Gastric polyp   . GERD (gastroesophageal reflux disease)   . Hiatal hernia   . Hyperparathyroidism ([place])   . IBS (irritable bowel syndrome)   . MVP (mitral valve prolapse)   . Osteoarthritis   . Ovarian cancer ([place])    lymph node removal with hysterectomy  . Raynaud's disease   . RLS (restless legs syndrome)   . Situational depression   . Sjogren's syndrome ([place])   . Vasculitis ([place])   . Vitamin D deficiency     Medications:  Medications Prior to Admission  Medication Sig Dispense Refill Last Dose  . albuterol (PROVENTIL HFA;VENTOLIN HFA) 108 (90 Base) MCG/ACT inhaler Inhale 2 puffs into the lungs every 6 (six) hours as needed for wheezing. 1 Inhaler 2 Taking  . atenolol (TENORMIN) 25 MG tablet Take 12.5 mg by mouth daily.  6 Taking  . B Complex-C (B-COMPLEX WITH VITAMIN C) tablet Take 1 tablet by mouth daily.   Taking  . budesonide-formoterol (SYMBICORT) 160-4.5 MCG/ACT inhaler Inhale 2 puffs into the lungs 2 (two) times daily. 3 Inhaler 3 Taking  .  bumetanide (BUMEX) 0.5 MG tablet TAKE 1 TABLET BY MOUTH EVERY OTHER DAY 90 tablet 0   . bumetanide (BUMEX) 1 MG tablet TAKE 1 TABLET BY MOUTH EVERY OTHER DAY ON OPPOSITE DAY OF 0.5 MG DOSE 90 tablet 0   . Cholecalciferol (VITAMIN D) 2000 UNITS CAPS Take 2 capsules by mouth every evening.    Taking  . Cyanocobalamin (VITAMIN B 12 PO) Take 500 mg by mouth daily.   Taking  . cyclobenzaprine (FLEXERIL) 5 MG tablet Take 0.5 tablets (2.5 mg total) by mouth 2 (two) times daily as needed for muscle spasms. (Patient not taking: Reported on 10/22/2017) 10 tablet 0 Not Taking  . docusate sodium (COLACE) 100 MG capsule Take 400 mg by mouth daily.    Taking  . esomeprazole (NEXIUM) 40 MG capsule  Take 1 capsule (40 mg total) by mouth daily. 1 capsule 0   . famotidine (PEPCID) 20 MG tablet Take 1 tablet (20 mg total) by mouth every evening. 30 tablet 2   . fexofenadine (ALLEGRA) 180 MG tablet Take 180 mg by mouth every morning.    Taking  . HYDROcodone-acetaminophen (NORCO) 7.5-325 MG tablet Take 1 tablet by mouth every 12 (twelve) hours as needed for moderate pain. 60 tablet 0 Taking  . Iron Polysacch Cmplx-B12-FA (POLY-IRON 150 FORTE) 150-0.025-1 MG CAPS Take 1 tablet by mouth daily. 90 each 3 Taking  . levothyroxine (SYNTHROID, LEVOTHROID) 50 MCG tablet Take 2 tablets (100 mcg total) by mouth daily. 180 tablet 2 Taking  . losartan (COZAAR) 100 MG tablet TAKE 1 TABLET BY MOUTH ONCE DAILY (NEEDS  APPOINTMENT) 90 tablet 0 Taking  . Magnesium 200 MG TABS Take 2 tablets by mouth daily.    Taking  . metroNIDAZOLE (METROGEL) 1 % gel Apply topically daily. 45 g 0   . montelukast (SINGULAIR) 10 MG tablet TAKE 1 TABLET BY MOUTH ONCE DAILY AT BEDTIME 90 tablet 1   . Olopatadine HCl 0.2 % SOLN INSTILL 1 DROP INTO AFFECTED EYE ONCE DAILY 2.5 mL 1   . ondansetron (ZOFRAN) 4 MG tablet Take 1 tablet (4 mg total) by mouth every 8 (eight) hours as needed for nausea or vomiting. 20 tablet 0 Taking  . predniSONE (DELTASONE) 5 MG tablet Take 1 tablet (5 mg total) by mouth daily. 90 tablet 1 Taking  . PREVIDENT 5000 DRY MOUTH 1.1 % GEL dental gel Place 1 application onto teeth as needed (as needed for dry mouth).    Taking  . RESTASIS 0.05 % ophthalmic emulsion INSTILL 1 DROP INTO EACH EYE TWICE DAILY 120 each 0 Taking  . Simethicone (GAS-X EXTRA STRENGTH) 125 MG CAPS Take 2 capsules by mouth daily as needed (for relief).    Taking  . traZODone (DESYREL) 150 MG tablet Take 1 tablet (150 mg total) by mouth at bedtime. 90 tablet 2   . warfarin (COUMADIN) 1 MG tablet TAKE 2 TO 4 TABLETS (= 2 TO 4MG ) BY MOUTH ONCE DAILY AS DIRECTED BY ANTICOGULATION CLINIC. 350 tablet 3 Taking    Assessment: Patient on chronic  coumadin for afib. Followed at anticoagulation clinic. Pharmacy asked to dose Home dose is 4mg  on Monday, Wednesday, and Friday and 2mg  all other days.  Goal of Therapy:  INR 2-3 Monitor platelets by anticoagulation protocol: Yes   Plan:  Coumadin 4mg  x 1 today Daily PT-INR Monitor for S/S of bleeding  Isac Sarna, BS [place], BCPS Clinical Pharmacist Pager [phone] 12/24/2017,11:05 AM

## 2017-12-24 NOTE — ED Provider Notes (Signed)
[name] EMERGENCY DEPARTMENT Provider Note   CSN: [id number] Arrival date & time: 12/24/17  0208     History   Chief Complaint Chief Complaint  Patient presents with  . Flank Pain    HPI Angela Wong is a 77 y.o. female.  multiple medical comorbidities including atrial fibrillation, sick sinus syndrome status post permanent pacemaker, COPD, hypothyroidism, mitral valve prolapse, restless leg syndrome, Sjogren's syndrome on chronic prednisone as well as dilated cardiomyopathy who presents via EMS with left-sided rib pain that radiates to her side and back.  She reports this pain started the morning of 12/10 and has become progressively worse.  Denies any falls or trauma.  It is worse with palpation and movement.  States this is dissimilar to the chest pain she was admitted for several months ago.  She took a "CBD bomb" at home without relief. Denies using any other pain meds. she has some shortness of breath with this pain.  Denies any cough or fever.  Denies any rash to the skin.  The pain starts in her left lateral ribs and radiates across her side.  She has not had this pain in the past.  Denies any pain with urination or blood in the urine.  No fevers, chills, nausea or vomiting.  The history is provided by the patient and the EMS personnel.  Flank Pain  Associated symptoms include chest pain and shortness of breath. Pertinent negatives include no abdominal pain and no headaches.    Past Medical History:  Diagnosis Date  . A-fib ([place])   . Asthma   . Cerebral vasculitis   . Congestive dilated cardiomyopathy ([place])   . COPD (chronic obstructive pulmonary disease) ([place])   . Fibromyalgia   . Gastric polyp   . GERD (gastroesophageal reflux disease)   . Hiatal hernia   . Hyperparathyroidism ([place])   . IBS (irritable bowel syndrome)   . MVP (mitral valve prolapse)   . Osteoarthritis   . Ovarian cancer ([place])    lymph node removal with hysterectomy  . Raynaud's disease   .  RLS (restless legs syndrome)   . Situational depression   . Sjogren's syndrome ([place])   . Vasculitis ([place])   . Vitamin D deficiency     Patient Active Problem List   Diagnosis Date Noted  . Anticoagulation goal of INR 2 to 3 12/18/2017  . Depression, major, single episode, moderate ([place]) 09/12/2017  . Chest pain 08/25/2017  . Generalized weakness 08/25/2017  . Seasonal and perennial allergic rhinitis 12/31/2016  . Allergic rhinitis with a nonallergic component 10/01/2016  . Pruritus 10/01/2016  . 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 Steroid-induced osteopenia 07/11/2016  . Hyperparathyroidism ([place]) 01/16/2016  . Iron deficiency anemia 12/18/2015  . Insomnia 12/18/2015  . Hypothyroidism 12/18/2015  . Rotator cuff tear 03/23/2014  . Osteoarthritis   . Moderate persistent asthma   . Ovarian cancer ([place])   . Fibromyalgia   . RLS (restless legs syndrome)   . Cerebral vasculitis   . Sjogren's syndrome ([place])   . Raynaud's disease   . MVP (mitral valve prolapse)   . IBS (irritable bowel syndrome)   . Congestive dilated cardiomyopathy ([place]) 02/09/2014  . Dyspnea 12/13/2013  . Hyperlipidemia 11/26/2013  . Malaise and fatigue 11/26/2013  . Multiple drug allergies 11/02/2013  . History of ovarian cancer 10/01/2013  . GERD (gastroesophageal reflux disease) 10/01/2013  . Benign essential HTN 10/01/2013  . Atrial fibrillation, permanent 07/27/2013    Past Surgical History:  Procedure  Laterality Date  . ABDOMINAL HYSTERECTOMY  1982   with right oophorectomy  . APPENDECTOMY    . BREAST BIOPSY Right    x 2  . BREAST SURGERY     Biopsy  . CARPAL TUNNEL RELEASE Right 1980  . CARPAL TUNNEL RELEASE Left 2010   x 2  . CATARACT EXTRACTION Bilateral   . CESAREAN SECTION     x 3  . KNEE SURGERY Left 2005  . OOPHORECTOMY Left 1962  . PACEMAKER INSERTION  09/15/2014  . REFRACTIVE SURGERY Bilateral 2014  . TUBAL LIGATION       OB History   None      Home Medications    Prior to  Admission medications   Medication Sig Start Date End Date Taking? Authorizing Provider  albuterol (PROVENTIL HFA;VENTOLIN HFA) 108 (90 Base) MCG/ACT inhaler Inhale 2 puffs into the lungs every 6 (six) hours as needed for wheezing. 10/01/16   Bobbitt, Sed[place] Muta, MD  atenolol (TENORMIN) 25 MG tablet Take 12.5 mg by mouth daily. 03/11/16   [provider]  B Complex-C (B-COMPLEX WITH VITAMIN C) tablet Take 1 tablet by mouth daily.    [provider]  budesonide-formoterol (SYMBICORT) 160-4.5 MCG/ACT inhaler Inhale 2 puffs into the lungs 2 (two) times daily. 05/13/17   [name] A, FNP  bumetanide (BUMEX) 0.5 MG tablet TAKE 1 TABLET BY MOUTH EVERY OTHER DAY 11/07/17   [name] A, FNP  bumetanide (BUMEX) 1 MG tablet TAKE 1 TABLET BY MOUTH EVERY OTHER DAY ON OPPOSITE DAY OF 0.5 MG DOSE 11/07/17   [name] A, FNP  Cholecalciferol (VITAMIN D) 2000 UNITS CAPS Take 2 capsules by mouth every evening.     [provider]  Cyanocobalamin (VITAMIN B 12 PO) Take 500 mg by mouth daily.    [provider]  cyclobenzaprine (FLEXERIL) 5 MG tablet Take 0.5 tablets (2.5 mg total) by mouth 2 (two) times daily as needed for muscle spasms. Patient not taking: Reported on 10/22/2017 08/01/17   Eustaquio Maize, MD  docusate sodium (COLACE) 100 MG capsule Take 400 mg by mouth daily.     [provider]  esomeprazole (NEXIUM) 40 MG capsule Take 1 capsule (40 mg total) by mouth daily. 10/22/17   Pyrtle, Lajuan Lines, MD  famotidine (PEPCID) 20 MG tablet Take 1 tablet (20 mg total) by mouth every evening. 10/22/17   Pyrtle, Lajuan Lines, MD  fexofenadine (ALLEGRA) 180 MG tablet Take 180 mg by mouth every morning.     [provider]  HYDROcodone-acetaminophen (NORCO) 7.5-325 MG tablet Take 1 tablet by mouth every 12 (twelve) hours as needed for moderate pain. 09/16/17   [name], FNP  Iron Polysacch Cmplx-B12-FA (POLY-IRON 150 FORTE) 150-0.025-1 MG CAPS Take 1 tablet  by mouth daily. 03/07/17   Memory Argue, PharmD  levothyroxine (SYNTHROID, LEVOTHROID) 50 MCG tablet Take 2 tablets (100 mcg total) by mouth daily. 09/16/17   [name] A, FNP  losartan (COZAAR) 100 MG tablet TAKE 1 TABLET BY MOUTH ONCE DAILY (NEEDS  APPOINTMENT) 10/14/17   [name] A, FNP  Magnesium 200 MG TABS Take 2 tablets by mouth daily.     [provider]  metroNIDAZOLE (METROGEL) 1 % gel Apply topically daily. 12/18/17   [name] A, FNP  montelukast (SINGULAIR) 10 MG tablet TAKE 1 TABLET BY MOUTH ONCE DAILY AT BEDTIME 11/07/17   Hawks, [name] A, FNP  Olopatadine HCl 0.2 % SOLN INSTILL 1 DROP INTO AFFECTED EYE  ONCE DAILY 12/16/17   [name] A, FNP  ondansetron (ZOFRAN) 4 MG tablet Take 1 tablet (4 mg total) by mouth every 8 (eight) hours as needed for nausea or vomiting. 09/16/17   [name] A, FNP  predniSONE (DELTASONE) 5 MG tablet Take 1 tablet (5 mg total) by mouth daily. 09/16/17   Hawks, Christy A, FNP  PREVIDENT 5000 DRY MOUTH 1.1 % GEL dental gel Place 1 application onto teeth as needed (as needed for dry mouth).  10/13/13   [provider]  RESTASIS 0.05 % ophthalmic emulsion INSTILL 1 DROP INTO EACH EYE TWICE DAILY 10/03/17   [name] A, FNP  Simethicone (GAS-X EXTRA STRENGTH) 125 MG CAPS Take 2 capsules by mouth daily as needed (for relief).     [provider]  traZODone (DESYREL) 150 MG tablet Take 1 tablet (150 mg total) by mouth at bedtime. 12/18/17   [name], FNP  warfarin (COUMADIN) 1 MG tablet TAKE 2 TO 4 TABLETS (= 2 TO 4MG ) BY MOUTH ONCE DAILY AS DIRECTED BY ANTICOGULATION CLINIC. 03/07/17   Memory Argue, PharmD    Family History Family History  Problem Relation Age of Onset  . COPD Mother   . Breast cancer Mother   . Allergic rhinitis Mother   . Heart disease Father        No details  . Kidney disease Father   . Allergic rhinitis Father   . Stroke Sister   . Arthritis/Rheumatoid Sister   .  Asthma Sister   . Lupus Sister   . Heart attack Sister   . Stroke Paternal Grandmother   . Scleroderma Grandchild   . Thyroid disease Other   . Breast cancer Maternal Aunt        x 2    Social History Social History   Tobacco Use  . Smoking status: Never Smoker  . Smokeless tobacco: Never Used  Substance Use Topics  . Alcohol use: No  . Drug use: No     Allergies   Cephalosporins; Ciprofloxacin; Doxycycline; Horse-derived products; Ketek [telithromycin]; Nitrofuran derivatives; Nitrous oxide; Other; Penicillins; Pentazocine; Sulfa antibiotics; Trovan [alatrofloxacin]; Amlodipine; Calcium channel blockers; Carvedilol; Codeine; Cymbalta [duloxetine hcl]; Diltiazem; Diovan [valsartan]; Lisinopril; Sertraline; Tramadol; Zoloft [sertraline hcl]; and Loteprednol etabonate   Review of Systems Review of Systems  Constitutional: Negative for activity change and appetite change.  HENT: Negative for congestion.   Eyes: Negative for visual disturbance.  Respiratory: Positive for chest tightness and shortness of breath.   Cardiovascular: Positive for chest pain.  Gastrointestinal: Negative for abdominal pain and vomiting.  Genitourinary: Positive for flank pain.  Musculoskeletal: Positive for arthralgias, back pain and myalgias.  Skin: Negative for rash.  Neurological: Negative for dizziness, weakness and headaches.    all other systems are negative except as noted in the HPI and PMH.    Physical Exam Updated Vital Signs BP (!) 160/102   Pulse 85   Temp 97.9 F (36.6 C)   Resp 20   Ht 5\' 7"  (1.702 m)   Wt 62.1 kg   SpO2 97%   BMI 21.46 kg/m   Physical Exam  Constitutional: She is oriented to person, place, and time. She appears well-developed and well-nourished. No distress.  Uncomfortable  HENT:  Head: Normocephalic and atraumatic.  Mouth/Throat: Oropharynx is clear and moist. No oropharyngeal exudate.  Eyes: Pupils are equal, round, and reactive to light.  Conjunctivae and EOM are normal.  Neck: Normal range of motion. Neck supple.  No meningismus.  Cardiovascular: Normal rate, regular rhythm, normal heart sounds and intact distal pulses.  No murmur heard. Equal femoral pulses bilaterally  Pulmonary/Chest: Effort normal and breath sounds normal. No respiratory distress. She exhibits tenderness.  Left lateral rib tenderness that is reproducible underneath left breast.  There is no rash or crepitance.  Abdominal: Soft. There is no tenderness. There is no rebound and no guarding.  Musculoskeletal: Normal range of motion. She exhibits no edema or tenderness.  Intact DP and PT pulses bilaterally  Neurological: She is alert and oriented to person, place, and time. No cranial nerve deficit. She exhibits normal muscle tone. Coordination normal.   5/5 strength throughout. CN 2-12 intact.Equal grip strength.   Skin: Skin is warm. Capillary refill takes less than 2 seconds. No rash noted.  Psychiatric: She has a normal mood and affect. Her behavior is normal.  Nursing note and vitals reviewed.    ED Treatments / Results  Labs (all labs ordered are listed, but only abnormal results are displayed) Labs Reviewed  COMPREHENSIVE METABOLIC PANEL - Abnormal; Notable for the following components:      Result Value   Potassium 2.9 (*)    Glucose, Bld 136 (*)    Calcium 8.8 (*)    Total Protein 6.3 (*)    All other components within normal limits  BRAIN NATRIURETIC PEPTIDE - Abnormal; Notable for the following components:   B Natriuretic Peptide 465.0 (*)    All other components within normal limits  PROTIME-INR - Abnormal; Notable for the following components:   Prothrombin Time 20.8 (*)    All other components within normal limits  I-STAT CHEM 8, ED - Abnormal; Notable for the following components:   Potassium 3.0 (*)    Glucose, Bld 131 (*)    Calcium, Ion 1.10 (*)    All other components within normal limits  CBC WITH DIFFERENTIAL/PLATELET    TROPONIN I  URINALYSIS, ROUTINE W REFLEX MICROSCOPIC  I-STAT TROPONIN, ED    EKG EKG Interpretation  Date/Time:  Wednesday December 24 2017 02:35:21 EST Ventricular Rate:  84 PR Interval:    QRS Duration: 95 QT Interval:  375 QTC Calculation: 444 R Axis:   85 Text Interpretation:  Atrial fibrillation Borderline right axis deviation Borderline low voltage, extremity leads Probable anteroseptal infarct, old Borderline repolarization abnormality No significant change was found Confirmed by Ezequiel Essex [id number]) on 12/24/2017 3:04:37 AM   Radiology Dg Chest Portable 1 View  Result Date: 12/24/2017 CLINICAL DATA:  Chest pain. EXAM: PORTABLE CHEST 1 VIEW COMPARISON:  08/25/2017. FINDINGS: Enlarged cardiac silhouette with an interval increase in size, accentuated by a decreased inspiration. Mild crowding of the pulmonary vasculature and interstitial markings due to the decreased inspiration. Otherwise, stable mild changes of COPD and chronic bronchitis. Stable right subclavian pacemaker lead with its tip at the right ventricular apex. No acute bony abnormality. IMPRESSION: 1. Mildly progressive cardiomegaly. 2. Stable mild changes of COPD and chronic bronchitis. Electronically Signed   By: Claudie Revering M.D.   On: 12/24/2017 02:52   Ct Angio Chest/abd/pel For Dissection W And/or Wo Contrast  Result Date: 12/24/2017 CLINICAL DATA:  77 y/o  F; 1 day of flank pain with nausea. EXAM: CT ANGIOGRAPHY CHEST, ABDOMEN AND PELVIS TECHNIQUE: Multidetector CT imaging through the chest, abdomen and pelvis was performed using the standard protocol during bolus administration of intravenous contrast. Multiplanar reconstructed images and MIPs were obtained and reviewed to evaluate the vascular anatomy. CONTRAST:  133mL ISOVUE-370 IOPAMIDOL (ISOVUE-370) INJECTION 76% COMPARISON:  08/01/2017 thoracic spine radiographs. 08/25/2017 chest radiograph. FINDINGS: CTA CHEST FINDINGS Cardiovascular: Preferential  opacification of the thoracic aorta. No evidence of thoracic aortic aneurysm or dissection. Moderate cardiomegaly. Single lead pacemaker with lead in the right ventricle. Mild coronary artery calcific atherosclerosis. Satisfactory opacification of pulmonary arteries, no pulmonary embolus identified. Mediastinum/Nodes: Mild prominence of mediastinal lymph nodes. Patent central airways. Normal thyroid gland. Normal thoracic esophagus. Lungs/Pleura: Platelike atelectasis in the lung bases. Trace right pleural effusion. Musculoskeletal: Chronic T12 superior endplate fracture with mild loss of vertebral body height. Review of the MIP images confirms the above findings. CTA ABDOMEN AND PELVIS FINDINGS VASCULAR Aorta: Normal caliber aorta without aneurysm, dissection, vasculitis or significant stenosis. Celiac: Patent without evidence of aneurysm, dissection, vasculitis or significant stenosis. Separate origin of splenic and hepatic arteries. SMA: Patent without evidence of aneurysm, dissection, vasculitis or significant stenosis. Renals: Both renal arteries are patent without evidence of aneurysm, dissection, vasculitis, fibromuscular dysplasia or significant stenosis. IMA: Patent without evidence of aneurysm, dissection, vasculitis or significant stenosis. Inflow: Patent without evidence of aneurysm, dissection, vasculitis or significant stenosis. Veins: Multiple calcified phleboliths along the course of the right ovarian vein. Review of the MIP images confirms the above findings. NON-VASCULAR Hepatobiliary: 10 mm cyst within the right lobe of liver. Otherwise no focal liver abnormality is seen. No gallstones, gallbladder wall thickening, or biliary dilatation. Pancreas: Unremarkable. No pancreatic ductal dilatation or surrounding inflammatory changes. Spleen: Normal in size without focal abnormality. Adrenals/Urinary Tract: Normal adrenal glands. Right kidney upper pole 17 mm and lower pole 11 mm cyst. Multiple segments  of left kidney cortical scarring. No urinary stone disease or hydronephrosis. Normal bladder. Stomach/Bowel: Stomach is within normal limits. Appendix not identified, no pericecal inflammation. No evidence of bowel wall thickening, distention, or inflammatory changes. Pan colonic diverticulosis. No findings of acute diverticulitis. Lymphatic: Aortic atherosclerosis. No enlarged abdominal or pelvic lymph nodes. Reproductive: Status post hysterectomy. No adnexal masses. Other: No abdominal wall hernia or abnormality. No abdominopelvic ascites. Musculoskeletal: Mild right and moderate left hip joint osteoarthrosis with loss of the joint space, fibrocystic degeneration of articular surfaces, and osteophytosis. L4-5 grade 1 anterolisthesis with prominent facet arthropathy. Multifactorial severe L4-5 canal stenosis and moderate to severe L3-4 spinal canal stenosis. Review of the MIP images confirms the above findings. IMPRESSION: 1. No evidence of aortic aneurysm, dissection, or vasculitis. 2. No acute process identified as explanation for pain. 3. Trace right pleural effusion. Platelike atelectasis in the lung bases. 4. Moderate cardiomegaly. 5. Pan colonic diverticulosis without evidence of acute diverticulitis. 6. Lumbar spondylosis with severe L4-5 canal stenosis and moderate to severe L3-4 spinal canal stenosis. Electronically Signed   By: Kristine Garbe M.D.   On: 12/24/2017 03:56    Procedures Procedures (including critical care time)  Medications Ordered in ED Medications  iopamidol (ISOVUE-370) 76 % injection 100 mL (has no administration in time range)  morphine 4 MG/ML injection 4 mg (4 mg Intravenous Given 12/24/17 0230)  ondansetron (ZOFRAN) injection 4 mg (4 mg Intravenous Given 12/24/17 0229)     Initial Impression / Assessment and Plan / ED Course  I have reviewed the triage vital signs and the nursing notes.  Pertinent labs & imaging results that were available during my care of  the patient were reviewed by me and considered in my medical decision making (see chart for details).   Patient with left-sided rib pain that radiates to her flank and side.  It is worse with palpation and movement. EKG unchanged.  Distribution  suspicious for herpes zoster but there is no visible rash.  Chest x-ray stable.  No rib fracture or pneumothorax.  Troponin negative and EKG unchanged.  Work-up is reassuring.  Patient however continues to complain of severe pain unrelieved with opiates.  Pain is somewhat reproducible to palpation. UA negative. Doubt pyelonephritis.  CT angiogram is negative for aortic dissection, pulmonary embolism, other acute pathology.  Patient still with severe intractable pain in her ribs unrelieved by multiple doses narcotic pain medication.  ACS seems unlikely with negative troponin in setting of almost 24 hours of constant pain. CT scan is negative for acute pathology.  Attempted to reassure patient.  She is still uncomfortable and fearful to go home. Will send second troponin.  Observation admission discussed with Dr. Darrick [place].   Final Clinical Impressions(s) / ED Diagnoses   Final diagnoses:  Chest pain, unspecified type  Rib pain on left side    ED Discharge Orders    None       Tuwanda Vokes, Annie Main, MD 12/24/17 0532

## 2017-12-24 NOTE — Progress Notes (Signed)
Discharge instructions given to patient. Patient verbalized understanding. Patient discharged with lidocaine patch on left side. Peripheral IV removed. Patient discharged to home. Taken down to the front via wheelchair to son's car.

## 2017-12-24 NOTE — ED Triage Notes (Signed)
Patient complains of flank pain x 1 day with nausea. Patient was given 4mg  of Zofran and 100 of normal saline. Patient does have pacemaker and was having chest pain prior.

## 2017-12-24 NOTE — ED Notes (Signed)
IV infiltrated in CT scan Dr. Wyvonnia Dusky notified. Cool compresses applied to patient's left arm. 20 in left AC removed in CT.

## 2017-12-24 NOTE — ED Provider Notes (Signed)
TIME SEEN: 11:42 PM  CHIEF COMPLAINT: Left rib pain  HPI: Patient is a 77 year old female with history of A. fib on Coumadin, COPD, dilated cardiomyopathy, Sjogren's who presents to the emergency department with left-sided rib pain.  Has been ongoing since December 10.  No injury that she can recall.  Reports pain is worse with palpation and movement.  States she feels like she is going to pass out because it hurts so bad.  She was seen in the emergency department at any pain early this morning and admitted to the hospital for observation and discharged this afternoon.  She had negative cardiac enzymes.  Patient had a normal CTA that ruled out PE, dissection, aneurysm.  No rash.  No fever.  No cough.  Was discharged with oxycodone but states she did not take any of her pain medication because pain medication makes her nauseous and she was not discharged with any nausea medicine.  ROS: See HPI Constitutional: no fever  Eyes: no drainage  ENT: no runny nose   Cardiovascular:   chest pain  Resp: no SOB  GI: no vomiting GU: no dysuria Integumentary: no rash  Allergy: no hives  Musculoskeletal: no leg swelling  Neurological: no slurred speech ROS otherwise negative  PAST MEDICAL HISTORY/PAST SURGICAL HISTORY:  Past Medical History:  Diagnosis Date  . A-fib ([place])   . Asthma   . Cerebral vasculitis   . Congestive dilated cardiomyopathy ([place])   . COPD (chronic obstructive pulmonary disease) ([place])   . Fibromyalgia   . Gastric polyp   . GERD (gastroesophageal reflux disease)   . Hiatal hernia   . Hyperparathyroidism ([place])   . IBS (irritable bowel syndrome)   . MVP (mitral valve prolapse)   . Osteoarthritis   . Ovarian cancer ([place])    lymph node removal with hysterectomy  . Raynaud's disease   . RLS (restless legs syndrome)   . Situational depression   . Sjogren's syndrome ([facility])   . Vasculitis ([place])   . Vitamin D deficiency     MEDICATIONS:  Prior to Admission medications    Medication Sig Start Date End Date Taking? Authorizing Provider  albuterol (PROVENTIL HFA;VENTOLIN HFA) 108 (90 Base) MCG/ACT inhaler Inhale 2 puffs into the lungs every 6 (six) hours as needed for wheezing. 10/01/16   Bobbitt, Sedalia Muta, MD  atenolol (TENORMIN) 25 MG tablet Take 12.5 mg by mouth daily. 03/11/16   [provider]  B Complex-C (B-COMPLEX WITH VITAMIN C) tablet Take 1 tablet by mouth daily.    [provider]  budesonide-formoterol (SYMBICORT) 160-4.5 MCG/ACT inhaler Inhale 2 puffs into the lungs 2 (two) times daily. 05/13/17   [name] A, FNP  bumetanide (BUMEX) 0.5 MG tablet TAKE 1 TABLET BY MOUTH EVERY OTHER DAY 11/07/17   [name] A, FNP  bumetanide (BUMEX) 1 MG tablet TAKE 1 TABLET BY MOUTH EVERY OTHER DAY ON OPPOSITE DAY OF 0.5 MG DOSE 11/07/17   [name] A, FNP  Cholecalciferol (VITAMIN D) 2000 UNITS CAPS Take 2 capsules by mouth every evening.     [provider]  Cyanocobalamin (VITAMIN B 12 PO) Take 500 mg by mouth daily.    [provider]  cyclobenzaprine (FLEXERIL) 5 MG tablet Take 0.5 tablets (2.5 mg total) by mouth 2 (two) times daily as needed for muscle spasms. Patient not taking: Reported on 10/22/2017 08/01/17   Eustaquio Maize, MD  docusate sodium (COLACE) 100 MG capsule Take 400 mg by mouth daily.  [provider]  esomeprazole (NEXIUM) 40 MG capsule Take 1 capsule (40 mg total) by mouth daily. 10/22/17   Pyrtle, Lajuan Lines, MD  famotidine (PEPCID) 20 MG tablet Take 1 tablet (20 mg total) by mouth every evening. 10/22/17   Pyrtle, Lajuan Lines, MD  fexofenadine (ALLEGRA) 180 MG tablet Take 180 mg by mouth every morning.     [provider]  Iron Polysacch Cmplx-B12-FA (POLY-IRON 150 FORTE) 150-0.025-1 MG CAPS Take 1 tablet by mouth daily. 03/07/17   Memory Argue, PharmD  levothyroxine (SYNTHROID, LEVOTHROID) 50 MCG tablet Take 2 tablets (100 mcg total) by mouth daily. 09/16/17   [name] A, FNP   lidocaine (LIDODERM) 5 % Place 1 patch onto the skin daily. Remove & Discard patch within 12 hours or as directed by MD 12/24/17   Kathie Dike, MD  losartan (COZAAR) 100 MG tablet TAKE 1 TABLET BY MOUTH ONCE DAILY (NEEDS  APPOINTMENT) 10/14/17   [name] A, FNP  Magnesium 200 MG TABS Take 2 tablets by mouth daily.     [provider]  metroNIDAZOLE (METROGEL) 1 % gel Apply topically daily. 12/18/17   [name] A, FNP  montelukast (SINGULAIR) 10 MG tablet TAKE 1 TABLET BY MOUTH ONCE DAILY AT BEDTIME 11/07/17   Hawks, [name] A, FNP  Olopatadine HCl 0.2 % SOLN INSTILL 1 DROP INTO AFFECTED EYE ONCE DAILY 12/16/17   [name] A, FNP  ondansetron (ZOFRAN) 4 MG tablet Take 1 tablet (4 mg total) by mouth every 8 (eight) hours as needed for nausea or vomiting. 09/16/17   [name] A, FNP  oxyCODONE-acetaminophen (PERCOCET/ROXICET) 5-325 MG tablet Take 1 tablet by mouth every 6 (six) hours as needed for moderate pain. 12/24/17   Kathie Dike, MD  predniSONE (DELTASONE) 10 MG tablet Take 40mg  po daily for 2 days then 30mg  daily for 2 days then 20mg  daily for 2 days then 10mg  daily for 2 days then 5mg  po daily 12/24/17   Kathie Dike, MD  PREVIDENT 5000 DRY MOUTH 1.1 % GEL dental gel Place 1 application onto teeth as needed (as needed for dry mouth).  10/13/13   [provider]  RESTASIS 0.05 % ophthalmic emulsion INSTILL 1 DROP INTO EACH EYE TWICE DAILY 10/03/17   [name] A, FNP  Simethicone (GAS-X EXTRA STRENGTH) 125 MG CAPS Take 2 capsules by mouth daily as needed (for relief).     [provider]  traZODone (DESYREL) 150 MG tablet Take 1 tablet (150 mg total) by mouth at bedtime. 12/18/17   [name] A, FNP  warfarin (COUMADIN) 1 MG tablet TAKE 2 TO 4 TABLETS (= 2 TO 4MG ) BY MOUTH ONCE DAILY AS DIRECTED BY ANTICOGULATION CLINIC. 03/07/17   Memory Argue, PharmD    ALLERGIES:  Allergies  Allergen Reactions  . Cephalosporins Hives and  Shortness Of Breath  . Ciprofloxacin Hives and Shortness Of Breath  . Doxycycline Hives and Shortness Of Breath  . Horse-Derived Products Anaphylaxis  . Ketek [Telithromycin] Palpitations    Chest discomfort  . Nitrofuran Derivatives Anaphylaxis and Hives    blisters  . Nitrous Oxide Nausea And Vomiting    Severe due to Sjogrens (Auto-Immune Disease)  . Other Anaphylaxis    ALLERGY TO HORSE SERUM  . Penicillins Hives and Shortness Of Breath  . Pentazocine Other (See Comments)    Other reaction(s): Mental Status Changes (intolerance) Altered Mental Status  Altered Mental Status  Altered Mental Status   . Sulfa Antibiotics Hives and Shortness Of  Breath  . Trovan [Alatrofloxacin] Palpitations, Other (See Comments) and Anaphylaxis    Chest pain, dizziness, irregular pulse  . Amlodipine Swelling  . Calcium Channel Blockers     Respiratory distress  . Carvedilol Other (See Comments)    Dizziness, "joint pain, depression"  . Codeine Nausea Only  . Cymbalta [Duloxetine Hcl] Swelling  . Diltiazem     Swollen throat   . Diovan [Valsartan] Swelling  . Lisinopril Swelling  . Sertraline   . Tramadol Nausea Only  . Zoloft [Sertraline Hcl]   . Loteprednol Etabonate Rash    SOCIAL HISTORY:  Social History   Tobacco Use  . Smoking status: Never Smoker  . Smokeless tobacco: Never Used  Substance Use Topics  . Alcohol use: No    FAMILY HISTORY: Family History  Problem Relation Age of Onset  . COPD Mother   . Breast cancer Mother   . Allergic rhinitis Mother   . Heart disease Father        No details  . Kidney disease Father   . Allergic rhinitis Father   . Stroke Sister   . Arthritis/Rheumatoid Sister   . Asthma Sister   . Lupus Sister   . Heart attack Sister   . Stroke Paternal Grandmother   . Scleroderma Grandchild   . Thyroid disease Other   . Breast cancer Maternal Aunt        x 2    EXAM: BP (!) 172/107 (BP Location: Right Arm)   Pulse 96   Temp 97.6 F  (36.4 C) (Oral)   Resp 20   SpO2 96%  CONSTITUTIONAL: Alert and oriented and responds appropriately to questions.  Elderly, moaning in pain, appears uncomfortable HEAD: Normocephalic EYES: Conjunctivae clear, pupils appear equal, EOMI ENT: normal nose; moist mucous membranes NECK: Supple, no meningismus, no nuchal rigidity, no LAD  CARD: Regularly irregular; S1 and S2 appreciated; no murmurs, no clicks, no rubs, no gallops CHEST:  Chest wall is tender to palpation over the left anterior and lateral lower ribs which reproduces her pain.  No crepitus, ecchymosis, erythema, warmth, rash or other lesions present.   RESP: Normal chest excursion without splinting or tachypnea; breath sounds clear and equal bilaterally; no wheezes, no rhonchi, no rales, no hypoxia or respiratory distress, speaking full sentences ABD/GI: Normal bowel sounds; non-distended; soft, non-tender, no rebound, no guarding, no peritoneal signs, no hepatosplenomegaly BACK:  The back appears normal and is non-tender to palpation, there is no CVA tenderness EXT: Normal ROM in all joints; non-tender to palpation; no edema; normal capillary refill; no cyanosis, no calf tenderness or swelling    SKIN: Normal color for age and race; warm; no rash NEURO: Moves all extremities equally PSYCH: The patient's mood and manner are appropriate. Grooming and personal hygiene are appropriate.  MEDICAL DECISION MAKING: Patient here with what appears to be chest wall pain.  There is no rash at this time to suggest shingles.  She has had multiple negative troponins and a negative CTA.  It seems she is here mostly for pain control.  Labs obtained in triage are unremarkable including negative troponin.  EKG shows A. fib which she has a history of without new ischemic abnormality.  Will work on pain control emergency department.  Will give morphine, Zofran.  ED PROGRESS: Patient's pain is been well controlled after 1 dose of morphine.  She is now  resting comfortably and states she feels comfortable with the plan to go home.  Her daughter is at  the bedside and states she is worried that the patient going home until "we get it figured out what is going on".  She is concerned that if this is shingles she would be contagious to other people.  Explained to daughter that we would not admit her to the hospital for shingles and she does not need admission at this time for chest pain rule out given she was just admitted for the same yesterday morning.  Her medical work-up here has been unremarkable and this seems to be musculoskeletal in nature and now her pain is well controlled.  She has oxycodone already at home for pain control and will discharge with Zofran.  Patient reports she is comfortable for plan for discharge home.    EKG Interpretation  Date/Time:  Wednesday December 24 2017 23:18:22 EST Ventricular Rate:  106 PR Interval:    QRS Duration: 112 QT Interval:  331 QTC Calculation: 440 R Axis:   90 Text Interpretation:  Atrial fibrillation Anteroseptal infarct, old Borderline repolarization abnormality No significant change since last tracing Confirmed by Haruki Arnold, Cyril Mourning [phone]) on 12/24/2017 11:24:42 PM           Celsa Nordahl, Delice Bison, DO 12/25/17 [id number]

## 2017-12-24 NOTE — ED Triage Notes (Signed)
Pt c/o left sided chest pain and nausea x 1 day. States she was seen at [facility] earlier today for same, reports no change in symptoms.

## 2017-12-25 DIAGNOSIS — R079 Chest pain, unspecified: Secondary | ICD-10-CM | POA: Diagnosis not present

## 2017-12-25 LAB — PROTIME-INR
INR: 1.7
PROTHROMBIN TIME: 19.8 s — AB (ref 11.4–15.2)

## 2017-12-25 MED ORDER — ONDANSETRON 4 MG PO TBDP: 4.0000 mg | ORAL_TABLET | Freq: Four times a day (QID) | ORAL | 0 refills | Status: DC | PRN

## 2017-12-25 NOTE — ED Notes (Signed)
PT states understanding of care given, follow up care, and medication prescribed. PT ambulated from ED to car with a steady gait. 

## 2017-12-26 ENCOUNTER — Telehealth: Payer: Self-pay | Admitting: *Deleted

## 2017-12-26 ENCOUNTER — Telehealth: Payer: Self-pay | Admitting: Family

## 2017-12-26 NOTE — Telephone Encounter (Signed)
Pt states she is still in pain with the pain meds, offered appt but pt declined stating she can't drive here. Pt wants to know if she can take 1/2 of flexeril with the oxy?

## 2017-12-26 NOTE — Telephone Encounter (Signed)
Given her age, I would think it would be best not to combine these. I would suggest to take oxycodone and if she awake and in pain after 45 mins to  1 hour she could take the flexeril 2.5 mg.

## 2017-12-26 NOTE — Telephone Encounter (Signed)
Attempted to contact patient x 2 to discuss transitional care management and to schedule an appointment with her PCP within two weeks of hospital discharge. Unable to reach patient by phone. Will attempt again within the next 2 business days.   [name], RN-BC, BSN Nurse Case Manager [facility] Ph: [phone]

## 2017-12-26 NOTE — Telephone Encounter (Signed)
PT states that she has been seen at [facility] twice this week, said that they have ran a bunch of tests and they gave her some oxycodone and anti nausea medicine. States that the oxycodone is not touching the pain she is having, left side and back pain. Wants to know if [name] could do something else???

## 2017-12-26 NOTE — Telephone Encounter (Signed)
Pt aware of provider feedback and voiced understanding. 

## 2017-12-30 ENCOUNTER — Ambulatory Visit: Payer: Medicare Other | Admitting: Allergy & Immunology

## 2017-12-31 ENCOUNTER — Ambulatory Visit: Payer: Medicare Other | Admitting: Allergy & Immunology

## 2018-01-01 DIAGNOSIS — M1712 Unilateral primary osteoarthritis, left knee: Secondary | ICD-10-CM | POA: Diagnosis not present

## 2018-01-01 DIAGNOSIS — M1612 Unilateral primary osteoarthritis, left hip: Secondary | ICD-10-CM | POA: Diagnosis not present

## 2018-01-01 DIAGNOSIS — M1611 Unilateral primary osteoarthritis, right hip: Secondary | ICD-10-CM | POA: Diagnosis not present

## 2018-01-05 ENCOUNTER — Encounter: Payer: Medicare Other | Admitting: Pharmacist Clinician (PhC)/ Clinical Pharmacy Specialist

## 2018-01-05 ENCOUNTER — Other Ambulatory Visit: Payer: Self-pay | Admitting: Family

## 2018-01-05 ENCOUNTER — Ambulatory Visit: Payer: Medicare Other | Admitting: Pharmacist Clinician (PhC)/ Clinical Pharmacy Specialist

## 2018-01-05 DIAGNOSIS — I4821 Permanent atrial fibrillation: Secondary | ICD-10-CM | POA: Diagnosis not present

## 2018-01-05 DIAGNOSIS — G4700 Insomnia, unspecified: Secondary | ICD-10-CM

## 2018-01-05 LAB — COAGUCHEK XS/INR WAIVED
INR: 1.9 — ABNORMAL HIGH (ref 0.9–1.1)
Prothrombin Time: 23 s

## 2018-01-05 MED ORDER — PREDNISONE 5 MG PO TABS: 5.0000 mg | ORAL_TABLET | Freq: Every day | ORAL | 1 refills | Status: DC

## 2018-01-05 MED ORDER — LOSARTAN POTASSIUM 100 MG PO TABS: 100.0000 mg | ORAL_TABLET | Freq: Every day | ORAL | 1 refills | Status: DC

## 2018-01-05 MED ORDER — METRONIDAZOLE 1 % EX GEL: Freq: Every day | CUTANEOUS | 0 refills | Status: DC

## 2018-01-05 MED ORDER — TRAZODONE HCL 150 MG PO TABS: 150.0000 mg | ORAL_TABLET | Freq: Every day | ORAL | 2 refills | Status: DC

## 2018-01-05 MED ORDER — FAMOTIDINE 20 MG PO TABS: 20.0000 mg | ORAL_TABLET | Freq: Every evening | ORAL | 2 refills | Status: DC

## 2018-01-05 NOTE — Patient Instructions (Signed)
Description   Continue taking warfarin dose of 4mg  on Mondays, Wednesdays, Fridays, and Saturdays. Take 2mg  all other days of the week  INR was 1.9 today (goal is 2.0 to 3.0) slightly thick today

## 2018-01-12 ENCOUNTER — Telehealth: Payer: Self-pay | Admitting: Family

## 2018-01-12 NOTE — Telephone Encounter (Signed)
Aware.  Advised to try to keep her appointment and go to pharmacy for some anti diarrhea medication.

## 2018-01-13 DIAGNOSIS — M1612 Unilateral primary osteoarthritis, left hip: Secondary | ICD-10-CM | POA: Diagnosis not present

## 2018-01-13 DIAGNOSIS — M1611 Unilateral primary osteoarthritis, right hip: Secondary | ICD-10-CM | POA: Diagnosis not present

## 2018-01-19 DIAGNOSIS — H04123 Dry eye syndrome of bilateral lacrimal glands: Secondary | ICD-10-CM | POA: Diagnosis not present

## 2018-01-19 DIAGNOSIS — H01001 Unspecified blepharitis right upper eyelid: Secondary | ICD-10-CM | POA: Diagnosis not present

## 2018-01-19 DIAGNOSIS — G43109 Migraine with aura, not intractable, without status migrainosus: Secondary | ICD-10-CM | POA: Diagnosis not present

## 2018-01-20 ENCOUNTER — Ambulatory Visit: Payer: Medicare Other | Admitting: Family

## 2018-01-22 ENCOUNTER — Ambulatory Visit (INDEPENDENT_AMBULATORY_CARE_PROVIDER_SITE_OTHER): Payer: Medicare Other | Admitting: Family

## 2018-01-22 ENCOUNTER — Encounter: Payer: Self-pay | Admitting: Family

## 2018-01-22 VITALS — BP 114/73 | HR 68 | Temp 97.0°F | Ht 64.0 in | Wt 134.6 lb

## 2018-01-22 DIAGNOSIS — M797 Fibromyalgia: Secondary | ICD-10-CM

## 2018-01-22 DIAGNOSIS — M1990 Unspecified osteoarthritis, unspecified site: Secondary | ICD-10-CM | POA: Diagnosis not present

## 2018-01-22 DIAGNOSIS — R531 Weakness: Secondary | ICD-10-CM

## 2018-01-22 DIAGNOSIS — Z09 Encounter for follow-up examination after completed treatment for conditions other than malignant neoplasm: Secondary | ICD-10-CM | POA: Diagnosis not present

## 2018-01-22 NOTE — Progress Notes (Signed)
Subjective:    Patient ID: Angela Wong, female    DOB: Dec 20, 1940, 78 y.o.   MRN: [id number]  Chief Complaint  Patient presents with  . Hospitalization Follow-up    HPI Pt presents to the office today for hospital follow up for left chest wall pain. She went to the ED on 12/24/17. She had a CT that ruled out PE and pneumonia and no acute EKG changes. She had negative cardiac markers.   It was felt that her pain was MSK and was given prednisone dose pack. However, patient did not start this because she thought she will have a sinus infection. Looking back on it, patient reports she had taken her trash can to the end of her driveway a couple of days prior and remember hitting a rock.   She was discharged with Tylenol and codeine and zofran.    States her pain is greatly improved at this time. She states she stopped her pain medication last Friday. She reports her aching, pain is a 3 out 10.   She has a follow up appt with Rheumatologists at the end of this month for possible RA and Fibromyalgia.   [facility] notes reviewed.    Review of Systems  Constitutional: Positive for fatigue.  Musculoskeletal: Positive for arthralgias.  All other systems reviewed and are negative.      Objective:   Physical Exam Vitals signs reviewed.  Constitutional:      General: She is not in acute distress.    Appearance: She is well-developed.  HENT:     Head: Normocephalic and atraumatic.     Right Ear: Tympanic membrane normal.     Left Ear: Tympanic membrane normal.  Eyes:     Pupils: Pupils are equal, round, and reactive to light.  Neck:     Musculoskeletal: Normal range of motion and neck supple.     Thyroid: No thyromegaly.  Cardiovascular:     Rate and Rhythm: Normal rate and regular rhythm.     Heart sounds: Normal heart sounds. No murmur.  Pulmonary:     Effort: Pulmonary effort is normal. No respiratory distress.     Breath sounds: Decreased breath sounds present. No  wheezing.  Abdominal:     General: Bowel sounds are normal. There is no distension.     Palpations: Abdomen is soft.     Tenderness: There is no abdominal tenderness.  Musculoskeletal:        General: No tenderness.     Right lower leg: No edema.     Left lower leg: No edema.     Comments: Generalized weakness, using a cane  Skin:    General: Skin is warm and dry.  Neurological:     Mental Status: She is alert and oriented to person, place, and time.     Cranial Nerves: No cranial nerve deficit.     Deep Tendon Reflexes: Reflexes are normal and symmetric.  Psychiatric:        Mood and Affect: Mood is depressed.        Behavior: Behavior normal.        Thought Content: Thought content normal.        Judgment: Judgment normal.       BP 114/73   Pulse 68   Temp (!) 97 F (36.1 C) (Oral)   Ht '5\' 4"'  (1.626 m)   Wt 134 lb 9.6 oz (61.1 kg)   BMI 23.10 kg/m      Assessment &  Plan:  Angela Wong comes in today with chief complaint of Hospitalization Follow-up   Diagnosis and orders addressed:  1. Generalized weakness - CMP14+EGFR - CBC with Differential/Platelet  2. Osteoarthritis, unspecified osteoarthritis type, unspecified site - CMP14+EGFR - CBC with Differential/Platelet  3. Fibromyalgia - CMP14+EGFR - CBC with Differential/Platelet  4. Hospital discharge follow-up - CMP14+EGFR - CBC with Differential/Platelet   Labs pending Health Maintenance reviewed Diet and exercise encouraged  Follow up plan: Keep chronic follow up   [name], FNP

## 2018-01-22 NOTE — Patient Instructions (Signed)

## 2018-01-23 LAB — CBC WITH DIFFERENTIAL/PLATELET
Basophils Absolute: 0 10*3/uL (ref 0.0–0.2)
Basos: 0 %
EOS (ABSOLUTE): 0 10*3/uL (ref 0.0–0.4)
Eos: 0 %
Hematocrit: 44.4 % (ref 34.0–46.6)
Hemoglobin: 14.8 g/dL (ref 11.1–15.9)
Immature Grans (Abs): 0.1 10*3/uL (ref 0.0–0.1)
Immature Granulocytes: 1 %
Lymphocytes Absolute: 1.4 10*3/uL (ref 0.7–3.1)
Lymphs: 15 %
MCH: 30.5 pg (ref 26.6–33.0)
MCHC: 33.3 g/dL (ref 31.5–35.7)
MCV: 92 fL (ref 79–97)
MONOCYTES: 6 %
Monocytes Absolute: 0.6 10*3/uL (ref 0.1–0.9)
Neutrophils Absolute: 7.4 10*3/uL — ABNORMAL HIGH (ref 1.4–7.0)
Neutrophils: 78 %
Platelets: 190 10*3/uL (ref 150–450)
RBC: 4.85 x10E6/uL (ref 3.77–5.28)
RDW: 12.7 % (ref 11.7–15.4)
WBC: 9.5 10*3/uL (ref 3.4–10.8)

## 2018-01-23 LAB — CMP14+EGFR
ALT: 12 IU/L (ref 0–32)
AST: 14 IU/L (ref 0–40)
Albumin/Globulin Ratio: 2.2 (ref 1.2–2.2)
Albumin: 4.2 g/dL (ref 3.5–4.8)
Alkaline Phosphatase: 77 IU/L (ref 39–117)
BUN/Creatinine Ratio: 19 (ref 12–28)
BUN: 17 mg/dL (ref 8–27)
Bilirubin Total: 0.6 mg/dL (ref 0.0–1.2)
CHLORIDE: 100 mmol/L (ref 96–106)
CO2: 26 mmol/L (ref 20–29)
Calcium: 9.3 mg/dL (ref 8.7–10.3)
Creatinine, Ser: 0.9 mg/dL (ref 0.57–1.00)
GFR calc Af Amer: 71 mL/min/{1.73_m2} (ref >59)
GFR calc non Af Amer: 61 mL/min/{1.73_m2} (ref >59)
Globulin, Total: 1.9 g/dL (ref 1.5–4.5)
Glucose: 93 mg/dL (ref 65–99)
Potassium: 4.2 mmol/L (ref 3.5–5.2)
Sodium: 140 mmol/L (ref 134–144)
Total Protein: 6.1 g/dL (ref 6.0–8.5)

## 2018-01-30 ENCOUNTER — Ambulatory Visit (INDEPENDENT_AMBULATORY_CARE_PROVIDER_SITE_OTHER): Payer: Medicare Other | Admitting: Allergy & Immunology

## 2018-01-30 VITALS — BP 132/64 | HR 78 | Resp 18 | Ht 65.0 in | Wt 137.2 lb

## 2018-01-30 DIAGNOSIS — J4540 Moderate persistent asthma, uncomplicated: Secondary | ICD-10-CM | POA: Diagnosis not present

## 2018-01-30 DIAGNOSIS — J302 Other seasonal allergic rhinitis: Secondary | ICD-10-CM | POA: Diagnosis not present

## 2018-01-30 DIAGNOSIS — Z881 Allergy status to other antibiotic agents status: Secondary | ICD-10-CM

## 2018-01-30 DIAGNOSIS — J3089 Other allergic rhinitis: Secondary | ICD-10-CM | POA: Diagnosis not present

## 2018-01-30 DIAGNOSIS — L299 Pruritus, unspecified: Secondary | ICD-10-CM

## 2018-01-30 MED ORDER — BUDESONIDE-FORMOTEROL FUMARATE 160-4.5 MCG/ACT IN AERO: INHALATION_SPRAY | RESPIRATORY_TRACT | 1 refills | Status: DC

## 2018-01-30 MED ORDER — BUDESONIDE-FORMOTEROL FUMARATE 160-4.5 MCG/ACT IN AERO: INHALATION_SPRAY | RESPIRATORY_TRACT | 3 refills | Status: DC

## 2018-01-30 MED ORDER — CETIRIZINE HCL 10 MG PO TABS: 10.0000 mg | ORAL_TABLET | Freq: Every day | ORAL | 5 refills | Status: DC

## 2018-01-30 MED ORDER — MONTELUKAST SODIUM 10 MG PO TABS: 10.0000 mg | ORAL_TABLET | Freq: Every day | ORAL | 3 refills | Status: DC

## 2018-01-30 NOTE — Patient Instructions (Addendum)
1. Seasonal and perennial allergic rhinitis - I would recommend using cetirizine 5mg  (half of a 10mg  tablet) daily to help with the allergic symptoms. - This might help prevent any reactions to the mold at your house. - This will help with the itching as well.  - Continue with nasal saline rinses as tolerated, but you can also try nasal saline gel ([place]).   2. Moderate persistent asthma, uncomplicated - Lung function looked fairly good today. - We will not make any changes at this time. - Daily controller medication(s): Singulair 10mg  daily and Symbicort 160/4.41mcg two puffs twice daily with spacer - Prior to physical activity: ProAir 2 puffs 10-15 minutes before physical activity. - Rescue medications: ProAir 4 puffs every 4-6 hours as needed - Asthma control goals:  * Full participation in all desired activities (may need albuterol before activity) * Albuterol use two time or less a week on average (not counting use with activity) * Cough interfering with sleep two time or less a month * Oral steroids no more than once a year * No hospitalizations  3. Concern for reaction to novocaine - I would recommend using an agent from the other class of medications (amides).  - These should not have cross reactivity with the class containing Novocaine.   4. Itching - I think the cetirizine will help to control the itching.  5. Return in about 6 months (around 07/31/2018).   Please inform us of any Emergency Department visits, hospitalizations, or changes in symptoms. Call us before going to the ED for breathing or allergy symptoms since we might be able to fit you in for a sick visit. Feel free to contact us anytime with any questions, problems, or concerns.  It was a pleasure to see you again today! Good luck with the new RA diagnosis!   Websites that have reliable patient information: 1. American Academy of Asthma, Allergy, and Immunology: www.aaaai.org 2. Food Allergy Research and  Education (FARE): foodallergy.org 3. Mothers of Asthmatics: http://www.asthmacommunitynetwork.org 4. American College of Allergy, Asthma, and Immunology: MonthlyElectricBill.co.uk   Make sure you are registered to vote!

## 2018-01-30 NOTE — Progress Notes (Signed)
FOLLOW UP  Date of Service/Encounter:  01/30/18   Assessment:   Moderate persistent asthma without complication  Seasonal and perennial allergic rhinitis (molds, ragweed, and cat)  Itching - unknown trigger  Sjogren's syndrome  Adverse reaction to local anesthetic (novocaine) - unlikely to be a true allergy   Asthma Reportables:  Severity: moderate persistent  Risk: high Control: not well controlled    Plan/Recommendations:   1. Seasonal and perennial allergic rhinitis - I would recommend using cetirizine 5mg  (half of a 10mg  tablet) daily to help with the allergic symptoms. - This might help prevent any reactions to the mold at your house. - This will help with the itching as well.  - Continue with nasal saline rinses as tolerated, but you can also try nasal saline gel ([place]).   2. Moderate persistent asthma, uncomplicated - Lung function looked fairly good today. - We will not make any changes at this time. - Daily controller medication(s): Singulair 10mg  daily and Symbicort 160/4.27mcg two puffs twice daily with spacer - Prior to physical activity: ProAir 2 puffs 10-15 minutes before physical activity. - Rescue medications: ProAir 4 puffs every 4-6 hours as needed - Asthma control goals:  * Full participation in all desired activities (may need albuterol before activity) * Albuterol use two time or less a week on average (not counting use with activity) * Cough interfering with sleep two time or less a month * Oral steroids no more than once a year * No hospitalizations  3. Concern for reaction to novocaine - I would recommend using an agent from the other class of medications (amides).  - These should not have cross reactivity with the class containing Novocaine.   4. Itching - I think the cetirizine will help to control the itching.  5. Return in about 6 months (around 07/31/2018).  Subjective:   Angela Wong is a 78 y.o. female presenting today  for follow up of  Chief Complaint  Patient presents with  . Allergies  . Pruritis  . Urticaria    Angela Wong has a history of the following: Patient Active Problem List   Diagnosis Date Noted  . Anticoagulation goal of INR 2 to 3 12/18/2017  . Depression, major, single episode, moderate ([place]) 09/12/2017  . Chest pain 08/25/2017  . Generalized weakness 08/25/2017  . Seasonal and perennial allergic rhinitis 12/31/2016  . Allergic rhinitis with a nonallergic component 10/01/2016  . Itching 10/01/2016  . 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 Steroid-induced osteopenia 07/11/2016  . Hyperparathyroidism ([place]) 01/16/2016  . Iron deficiency anemia 12/18/2015  . Insomnia 12/18/2015  . Hypothyroidism 12/18/2015  . Rotator cuff tear 03/23/2014  . Osteoarthritis   . Moderate persistent asthma without complication   . Ovarian cancer ([place])   . Fibromyalgia   . RLS (restless legs syndrome)   . Cerebral vasculitis   . Sjogren's syndrome ([place])   . Raynaud's disease   . MVP (mitral valve prolapse)   . IBS (irritable bowel syndrome)   . Congestive dilated cardiomyopathy ([place]) 02/09/2014  . Dyspnea 12/13/2013  . Hyperlipidemia 11/26/2013  . Malaise and fatigue 11/26/2013  . Allergy to multiple antibiotics 11/02/2013  . History of ovarian cancer 10/01/2013  . GERD (gastroesophageal reflux disease) 10/01/2013  . Benign essential HTN 10/01/2013  . Atrial fibrillation, permanent 07/27/2013    History obtained from: chart review and patient.  Angela Wong's Primary Care Provider is [name], FNP.     Angela Wong is a 78 y.o.  female presenting for a follow up visit. She was last seen in June 2019. She has a host of medical problems, however the ones that I actively manage are her asthma and her rhinitis as well as her itching. At the last visit, I recommended restarting the azelastine nasal spray and continuing with the nasal saline rinses. Her lung function looked fairly good. We did  not make any changes and instead continued her on the Singulair and Symbicort 160/4.45mcg two puffs twice daily with a spacer. She has ProAir on hand to use as needed. She also has a history of Sjogren's syndrome and we recommended continuing with Rheumatology. Her itching was continuing to be a problem and we did obtain labs to rule out serious etiologies of itching. We also recommended starting cetirizine 10mg  at night to help cal her itching. An environmental allergy panel was negative as was a stinging insect panel.   Since the last visit, she reports that she has mostly done well. She does have a new complaint today. She tells me that she went to her dentist and had to receive Novocaine. She reports that they had to give her a lot of this to completely numb her teeth, but then she had a prolonged episode of drooling and numbness following the procedure. She also reports some itching on her face following the injection, but denies throat swelling or other systemic symptoms. She does tell me that she has a history of trigeminal neuralgia which likely made the sensation more intense. She has received this in the past, but she is refusing to receive it ever again in the future.   Asthma/Respiratory Symptom History: She has not needed any prednisone for her breathing since the last visit. She remains on the Singulair as well as the Symbicort two puffs BID. She has not needed her ProAir often at all. Angela Wong's asthma has been well controlled. She has not required rescue medication, experienced nocturnal awakenings due to lower respiratory symptoms, nor have activities of daily living been limited. She has required no Emergency Department or Urgent Care visits for her asthma. She has required zero courses of systemic steroids for asthma exacerbations since the last visit. ACT score today is 18, indicating excellent asthma symptom control.   Allergic Rhinitis Symptom History: She continues to have allergy  problems. However it turns out that she is not using her antihistamine at all since it made her very sleepy. She was taking cetirizine 10mg  daily, but has not done this in quite some time.   She did have a hospitalization for chest pain, but this was unrelated to her asthma. It was felt that she had pain secondary to neuropathic or musculoskeletal in nature. She was placed on a prednisone burst for pain control purposes. CT angio was negative for a PE and her cardiac workup was normal.   There is a possible diagnosis of RA. She has a diagnosis of Sjogren's syndrome but now she is being worked up for [employer]. They are considering starting her on methotrexate and Angela Wong is very leary about this. Specifically she is concerned with the immunosuppression. She continues to have problems with pain control and she asks me today if I know where to get mariajuana gummies. I tell her that I have absolutely no idea of this.    Otherwise, there have been no changes to her past medical history, surgical history, family history, or social history.    Review of Systems: a 14-point review of systems is pertinent for  what is mentioned in HPI.  Otherwise, all other systems were negative.  Constitutional: negative other than that listed in the HPI Eyes: negative other than that listed in the HPI Ears, nose, mouth, throat, and face: negative other than that listed in the HPI Respiratory: negative other than that listed in the HPI Cardiovascular: negative other than that listed in the HPI Gastrointestinal: negative other than that listed in the HPI Genitourinary: negative other than that listed in the HPI Integument: negative other than that listed in the HPI Hematologic: negative other than that listed in the HPI Musculoskeletal: negative other than that listed in the HPI Neurological: negative other than that listed in the HPI Allergy/Immunologic: negative other than that listed in the HPI    Objective:    Blood pressure 132/64, pulse 78, resp. rate 18, height 5\' 5"  (1.651 m), weight 137 lb 3.2 oz (62.2 kg), SpO2 98 %. Body mass index is 22.83 kg/m.   Physical Exam:  General: Alert, interactive, in no acute distress. Smiling and well appearing for her. She does have good energy today.  Eyes: No conjunctival injection bilaterally, no discharge on the right, no discharge on the left and no Horner-Trantas dots present. PERRL bilaterally. EOMI without pain. No photophobia.  Ears: Right TM pearly gray with normal light reflex, Left TM pearly gray with normal light reflex, Right TM intact without perforation and Left TM intact without perforation.  Nose/Throat: External nose within normal limits and septum midline. Turbinates edematous and pale with clear discharge. Posterior oropharynx erythematous without cobblestoning in the posterior oropharynx. Tonsils 2+ without exudates.  Tongue without thrush. Lungs: Clear to auscultation without wheezing, rhonchi or rales. No increased work of breathing. CV: Normal S1/S2. No murmurs. Capillary refill <2 seconds.  Skin: Warm and dry, without lesions or rashes. Neuro:   Grossly intact. No focal deficits appreciated. Responsive to questions.  Diagnostic studies:   Spirometry: results abnormal (FEV1: 1.50/83%, FVC: 2.25/85%, FEV1/FVC: 66%).    Spirometry consistent with mild obstructive disease.   Allergy Studies: none     Salvatore Marvel, MD  [facility]

## 2018-01-31 ENCOUNTER — Encounter: Payer: Self-pay | Admitting: Allergy & Immunology

## 2018-02-04 ENCOUNTER — Ambulatory Visit: Payer: Medicare Other | Admitting: Pharmacist Clinician (PhC)/ Clinical Pharmacy Specialist

## 2018-02-04 ENCOUNTER — Encounter: Payer: Self-pay | Admitting: Pharmacist Clinician (PhC)/ Clinical Pharmacy Specialist

## 2018-02-04 DIAGNOSIS — I7300 Raynaud's syndrome without gangrene: Secondary | ICD-10-CM | POA: Diagnosis not present

## 2018-02-04 DIAGNOSIS — I4821 Permanent atrial fibrillation: Secondary | ICD-10-CM | POA: Diagnosis not present

## 2018-02-04 DIAGNOSIS — M1180 Other specified crystal arthropathies, unspecified site: Secondary | ICD-10-CM | POA: Diagnosis not present

## 2018-02-04 DIAGNOSIS — R5382 Chronic fatigue, unspecified: Secondary | ICD-10-CM | POA: Diagnosis not present

## 2018-02-04 DIAGNOSIS — M797 Fibromyalgia: Secondary | ICD-10-CM | POA: Diagnosis not present

## 2018-02-04 DIAGNOSIS — M3500 Sicca syndrome, unspecified: Secondary | ICD-10-CM | POA: Diagnosis not present

## 2018-02-04 DIAGNOSIS — I776 Arteritis, unspecified: Secondary | ICD-10-CM | POA: Diagnosis not present

## 2018-02-04 DIAGNOSIS — M25562 Pain in left knee: Secondary | ICD-10-CM | POA: Diagnosis not present

## 2018-02-04 DIAGNOSIS — M79643 Pain in unspecified hand: Secondary | ICD-10-CM | POA: Diagnosis not present

## 2018-02-04 DIAGNOSIS — M1990 Unspecified osteoarthritis, unspecified site: Secondary | ICD-10-CM | POA: Diagnosis not present

## 2018-02-04 DIAGNOSIS — M2550 Pain in unspecified joint: Secondary | ICD-10-CM | POA: Diagnosis not present

## 2018-02-04 LAB — COAGUCHEK XS/INR WAIVED
INR: 2.3 — ABNORMAL HIGH (ref 0.9–1.1)
Prothrombin Time: 27.9 s

## 2018-02-04 NOTE — Patient Instructions (Signed)
Description   Continue taking warfarin dose of 4mg  on Mondays, Wednesdays, Fridays, and Saturdays. Take 2mg  all other days of the week  INR was 2.3 today (goal is 2.0 to 3.0) Perfect reading today!

## 2018-02-09 ENCOUNTER — Telehealth: Payer: Self-pay | Admitting: Family

## 2018-02-10 NOTE — Telephone Encounter (Signed)
Faxed labs

## 2018-02-10 NOTE — Telephone Encounter (Signed)
Error wrong pt / disregard

## 2018-02-10 NOTE — Telephone Encounter (Signed)
Printed shot record off [facility] and called mother to let her know

## 2018-02-25 DIAGNOSIS — I495 Sick sinus syndrome: Secondary | ICD-10-CM | POA: Diagnosis not present

## 2018-02-27 DIAGNOSIS — Z950 Presence of cardiac pacemaker: Secondary | ICD-10-CM | POA: Diagnosis not present

## 2018-02-27 DIAGNOSIS — I472 Ventricular tachycardia: Secondary | ICD-10-CM | POA: Diagnosis not present

## 2018-02-27 DIAGNOSIS — I4821 Permanent atrial fibrillation: Secondary | ICD-10-CM | POA: Diagnosis not present

## 2018-02-27 DIAGNOSIS — I495 Sick sinus syndrome: Secondary | ICD-10-CM | POA: Diagnosis not present

## 2018-03-12 ENCOUNTER — Ambulatory Visit (INDEPENDENT_AMBULATORY_CARE_PROVIDER_SITE_OTHER): Payer: Medicare Other | Admitting: Family

## 2018-03-12 ENCOUNTER — Ambulatory Visit (INDEPENDENT_AMBULATORY_CARE_PROVIDER_SITE_OTHER): Payer: Medicare Other

## 2018-03-12 ENCOUNTER — Encounter: Payer: Self-pay | Admitting: Family

## 2018-03-12 VITALS — BP 116/74 | HR 63 | Temp 98.2°F | Ht 65.0 in | Wt 139.6 lb

## 2018-03-12 DIAGNOSIS — R531 Weakness: Secondary | ICD-10-CM

## 2018-03-12 DIAGNOSIS — J441 Chronic obstructive pulmonary disease with (acute) exacerbation: Secondary | ICD-10-CM

## 2018-03-12 DIAGNOSIS — R05 Cough: Secondary | ICD-10-CM | POA: Diagnosis not present

## 2018-03-12 MED ORDER — AZITHROMYCIN 250 MG PO TABS: ORAL_TABLET | ORAL | 0 refills | Status: DC

## 2018-03-12 MED ORDER — BENZONATATE 200 MG PO CAPS: 200.0000 mg | ORAL_CAPSULE | Freq: Three times a day (TID) | ORAL | 1 refills | Status: DC | PRN

## 2018-03-12 NOTE — Patient Instructions (Signed)

## 2018-03-12 NOTE — Progress Notes (Signed)
Subjective:    Patient ID: Angela Wong, female    DOB: 1940/05/31, 78 y.o.   MRN: [id number]  Chief Complaint  Patient presents with  . Cough    Cough  This is a recurrent problem. The current episode started 1 to 4 weeks ago. The problem has been gradually worsening. The problem occurs every few minutes. The cough is non-productive. Associated symptoms include chills, ear pain, headaches, myalgias, postnasal drip, a sore throat, shortness of breath and wheezing. Pertinent negatives include no ear congestion, fever or nasal congestion. The symptoms are aggravated by lying down. She has tried rest and OTC cough suppressant for the symptoms. The treatment provided mild relief. Her past medical history is significant for COPD and environmental allergies.        Review of Systems  Constitutional: Positive for chills. Negative for fever.  HENT: Positive for ear pain, postnasal drip and sore throat.   Respiratory: Positive for cough, shortness of breath and wheezing.   Musculoskeletal: Positive for myalgias.  Allergic/Immunologic: Positive for environmental allergies.  Neurological: Positive for headaches.  All other systems reviewed and are negative.      Objective:   Physical Exam Vitals signs reviewed.  Constitutional:      General: She is not in acute distress.    Appearance: She is well-developed. She is ill-appearing.  HENT:     Head: Normocephalic and atraumatic.     Right Ear: Tympanic membrane normal.     Left Ear: Tympanic membrane normal.  Eyes:     Pupils: Pupils are equal, round, and reactive to light.  Neck:     Musculoskeletal: Normal range of motion and neck supple.     Thyroid: No thyromegaly.  Cardiovascular:     Rate and Rhythm: Normal rate and regular rhythm.     Heart sounds: Normal heart sounds. No murmur.  Pulmonary:     Effort: Pulmonary effort is normal. No respiratory distress.     Breath sounds: Decreased breath sounds present. No wheezing.    Abdominal:     General: Bowel sounds are normal. There is no distension.     Palpations: Abdomen is soft.     Tenderness: There is no abdominal tenderness.  Musculoskeletal:        General: No tenderness.     Comments: Generalized weakness, using rolling walker  Skin:    General: Skin is warm and dry.  Neurological:     Mental Status: She is alert and oriented to person, place, and time.     Cranial Nerves: No cranial nerve deficit.     Deep Tendon Reflexes: Reflexes are normal and symmetric.  Psychiatric:        Behavior: Behavior normal.        Thought Content: Thought content normal.        Judgment: Judgment normal.       BP 116/74   Pulse 63   Temp 98.2 F (36.8 C) (Oral)   Ht 5\' 5"  (1.651 m)   Wt 139 lb 9.6 oz (63.3 kg)   BMI 23.23 kg/m      Assessment & Plan:  Angela Wong comes in today with chief complaint of Cough   Diagnosis and orders addressed:  1. COPD exacerbation ([place]) - Take meds as prescribed - Use a cool mist humidifier  -Use saline nose sprays frequently -Force fluids -For any cough or congestion  Use plain Mucinex- regular strength or max strength is fine -For fever or aces or  pains- take tylenol or ibuprofen. -Throat lozenges if help -RTO if symptoms worsen or do not improve, and keep appt with Pharm for INR - azithromycin (ZITHROMAX) 250 MG tablet; Take 500 mg once, then 250 mg for four days  Dispense: 6 tablet; Refill: 0 - DG Chest 2 View; Future - benzonatate (TESSALON) 200 MG capsule; Take 1 capsule (200 mg total) by mouth 3 (three) times daily as needed.  Dispense: 30 capsule; Refill: 1  2. Generalized weakness - benzonatate (TESSALON) 200 MG capsule; Take 1 capsule (200 mg total) by mouth 3 (three) times daily as needed.  Dispense: 30 capsule; Refill: [place], FNP

## 2018-03-16 ENCOUNTER — Ambulatory Visit (INDEPENDENT_AMBULATORY_CARE_PROVIDER_SITE_OTHER): Payer: Medicare Other | Admitting: Family

## 2018-03-16 ENCOUNTER — Encounter: Payer: Self-pay | Admitting: Family

## 2018-03-16 VITALS — BP 123/74 | HR 69 | Temp 98.7°F

## 2018-03-16 DIAGNOSIS — Z7901 Long term (current) use of anticoagulants: Secondary | ICD-10-CM | POA: Diagnosis not present

## 2018-03-16 DIAGNOSIS — J189 Pneumonia, unspecified organism: Secondary | ICD-10-CM | POA: Diagnosis not present

## 2018-03-16 DIAGNOSIS — Z5181 Encounter for therapeutic drug level monitoring: Secondary | ICD-10-CM

## 2018-03-16 DIAGNOSIS — I4821 Permanent atrial fibrillation: Secondary | ICD-10-CM | POA: Diagnosis not present

## 2018-03-16 DIAGNOSIS — Z881 Allergy status to other antibiotic agents status: Secondary | ICD-10-CM | POA: Diagnosis not present

## 2018-03-16 DIAGNOSIS — R531 Weakness: Secondary | ICD-10-CM | POA: Diagnosis not present

## 2018-03-16 LAB — COAGUCHEK XS/INR WAIVED
INR: 3.4 — AB (ref 0.9–1.1)
PROTHROMBIN TIME: 41.2 s

## 2018-03-16 MED ORDER — CLINDAMYCIN HCL 300 MG PO CAPS: 300.0000 mg | ORAL_CAPSULE | Freq: Three times a day (TID) | ORAL | 0 refills | Status: DC

## 2018-03-16 NOTE — Patient Instructions (Signed)
Community-Acquired Pneumonia, Adult  Pneumonia is an infection of the lungs. It causes swelling in the airways of the lungs. Mucus and fluid may also build up inside the airways.  One type of pneumonia can happen while a person is in a hospital. A different type can happen when a person is not in a hospital (community-acquired pneumonia).   What are the causes?    This condition is caused by germs (viruses, bacteria, or fungi). Some types of germs can be passed from one person to another. This can happen when you breathe in droplets from the cough or sneeze of an infected person.  What increases the risk?  You are more likely to develop this condition if you:   Have a long-term (chronic) disease, such as:  ? Chronic obstructive pulmonary disease (COPD).  ? Asthma.  ? Cystic fibrosis.  ? Congestive heart failure.  ? Diabetes.  ? Kidney disease.   Have HIV.   Have sickle cell disease.   Have had your spleen removed.   Do not take good care of your teeth and mouth (poor dental hygiene).   Have a medical condition that increases the risk of breathing in droplets from your own mouth and nose.   Have a weakened body defense system (immune system).   Are a smoker.   Travel to areas where the germs that cause this illness are common.   Are around certain animals or the places they live.  What are the signs or symptoms?   A dry cough.   A wet (productive) cough.   Fever.   Sweating.   Chest pain. This often happens when breathing deeply or coughing.   Fast breathing or trouble breathing.   Shortness of breath.   Shaking chills.   Feeling tired (fatigue).   Muscle aches.  How is this treated?  Treatment for this condition depends on many things. Most adults can be treated at home. In some cases, treatment must happen in a hospital. Treatment may include:   Medicines given by mouth or through an IV tube.   Being given extra oxygen.   Respiratory therapy.  In rare cases, treatment for very bad pneumonia  may include:   Using a machine to help you breathe.   Having a procedure to remove fluid from around your lungs.  Follow these instructions at home:  Medicines   Take over-the-counter and prescription medicines only as told by your doctor.  ? Only take cough medicine if you are losing sleep.   If you were prescribed an antibiotic medicine, take it as told by your doctor. Do not stop taking the antibiotic even if you start to feel better.  General instructions     Sleep with your head and neck raised (elevated). You can do this by sleeping in a recliner or by putting a few pillows under your head.   Rest as needed. Get at least 8 hours of sleep each night.   Drink enough water to keep your pee (urine) pale yellow.   Eat a healthy diet that includes plenty of vegetables, fruits, whole grains, low-fat dairy products, and lean protein.   Do not use any products that contain nicotine or tobacco. These include cigarettes, e-cigarettes, and chewing tobacco. If you need help quitting, ask your doctor.   Keep all follow-up visits as told by your doctor. This is important.  How is this prevented?  A shot (vaccine) can help prevent pneumonia. Shots are often suggested for:   People   older than 79 years of age.   People older than 79 years of age who:  ? Are having cancer treatment.  ? Have long-term (chronic) lung disease.  ? Have problems with their body's defense system.  You may also prevent pneumonia if you take these actions:   Get the flu (influenza) shot every year.   Go to the dentist as often as told.   Wash your hands often. If you cannot use soap and water, use hand sanitizer.  Contact a doctor if:   You have a fever.   You lose sleep because your cough medicine does not help.  Get help right away if:   You are short of breath and it gets worse.   You have more chest pain.   Your sickness gets worse. This is very serious if:  ? You are an older adult.  ? Your body's defense system is weak.   You  cough up blood.  Summary   Pneumonia is an infection of the lungs.   Most adults can be treated at home. Some will need treatment in a hospital.   Drink enough water to keep your pee pale yellow.   Get at least 8 hours of sleep each night.  This information is not intended to replace advice given to you by your health care provider. Make sure you discuss any questions you have with your health care provider.  Document Released: 06/19/2007 Document Revised: 08/28/2017 Document Reviewed: 08/28/2017  Elsevier Interactive Patient Education  2019 [employer]

## 2018-03-16 NOTE — Addendum Note (Signed)
Addended by: [name] A on: 03/16/2018 04:43 PM   Modules accepted: Orders, Level of Service

## 2018-03-16 NOTE — Progress Notes (Signed)
Subjective:    Patient ID: Angela Wong, female    DOB: 1940-07-06, 78 y.o.   MRN: [id number]  Chief Complaint  Patient presents with  . Cough   Pt presents to the office today with recurrent cough and weakness. She completed her Zpak today and started a prednisone dose pack on 03/12/18.  Cough  This is a recurrent problem. The current episode started 1 to 4 weeks ago. The problem has been gradually worsening. The problem occurs every few minutes. The cough is productive of sputum. Associated symptoms include chills, ear congestion, ear pain, a fever, headaches, myalgias, nasal congestion, postnasal drip, rhinorrhea, a sore throat, shortness of breath and wheezing. The symptoms are aggravated by lying down. She has tried rest and oral steroids for the symptoms. The treatment provided mild relief. Her past medical history is significant for COPD.      Review of Systems  Constitutional: Positive for chills and fever.  HENT: Positive for ear pain, postnasal drip, rhinorrhea and sore throat.   Respiratory: Positive for cough, shortness of breath and wheezing.   Musculoskeletal: Positive for myalgias.  Neurological: Positive for headaches.  All other systems reviewed and are negative.      Objective:   Physical Exam Vitals signs reviewed.  Constitutional:      General: She is not in acute distress.    Appearance: She is well-developed.  HENT:     Head: Normocephalic and atraumatic.     Right Ear: External ear normal.  Eyes:     Pupils: Pupils are equal, round, and reactive to light.  Neck:     Musculoskeletal: Normal range of motion and neck supple.     Thyroid: No thyromegaly.  Cardiovascular:     Rate and Rhythm: Normal rate and regular rhythm.     Heart sounds: Normal heart sounds. No murmur.  Pulmonary:     Effort: No respiratory distress.     Breath sounds: Rales present. No wheezing.  Abdominal:     General: Bowel sounds are normal. There is no distension.   Palpations: Abdomen is soft.     Tenderness: There is no abdominal tenderness.  Musculoskeletal:        General: No tenderness.     Comments: Generalized weakness  Skin:    General: Skin is warm and dry.  Neurological:     Mental Status: She is alert and oriented to person, place, and time.     Cranial Nerves: No cranial nerve deficit.     Deep Tendon Reflexes: Reflexes are normal and symmetric.  Psychiatric:        Behavior: Behavior normal.        Thought Content: Thought content normal.        Judgment: Judgment normal.       BP 123/74   Pulse 69   Temp 98.7 F (37.1 C) (Oral)      Assessment & Plan:  Angela Wong comes in today with chief complaint of Cough   Diagnosis and orders addressed:  1. Community acquired pneumonia, unspecified laterality Will start Clindamycin  Force fluids Rest - clindamycin (CLEOCIN) 300 MG capsule; Take 1 capsule (300 mg total) by mouth 3 (three) times daily.  Dispense: 21 capsule; Refill: 0  2. Atrial fibrillation, permanent Description   Hold todays dose,  warfarin dose of 4mg  on Mondays, Wednesdays,and  Fridays. Take 2mg  all other days of the week  INR was 3.4 today (goal is 2.0 to 3.0) Thin!    -  CoaguChek XS/INR Waived  3. Anticoagulation goal of INR 2 to 3 - CoaguChek XS/INR Waived  4. Allergy to multiple antibiotics  5. Generalized weakness  RTO in 2 weeks   Angela Dun, FNP

## 2018-03-17 ENCOUNTER — Telehealth: Payer: Self-pay | Admitting: Family

## 2018-03-17 LAB — CMP14+EGFR
ALT: 16 IU/L (ref 0–32)
AST: 21 IU/L (ref 0–40)
Albumin/Globulin Ratio: 1.9 (ref 1.2–2.2)
Albumin: 3.9 g/dL (ref 3.7–4.7)
Alkaline Phosphatase: 73 IU/L (ref 39–117)
BUN/Creatinine Ratio: 13 (ref 12–28)
BUN: 11 mg/dL (ref 8–27)
Bilirubin Total: 0.6 mg/dL (ref 0.0–1.2)
CALCIUM: 8.7 mg/dL (ref 8.7–10.3)
CO2: 26 mmol/L (ref 20–29)
Chloride: 93 mmol/L — ABNORMAL LOW (ref 96–106)
Creatinine, Ser: 0.82 mg/dL (ref 0.57–1.00)
GFR calc Af Amer: 79 mL/min/{1.73_m2} (ref >59)
GFR, EST NON AFRICAN AMERICAN: 69 mL/min/{1.73_m2} (ref >59)
Globulin, Total: 2.1 g/dL (ref 1.5–4.5)
Glucose: 126 mg/dL — ABNORMAL HIGH (ref 65–99)
Potassium: 3.8 mmol/L (ref 3.5–5.2)
Sodium: 135 mmol/L (ref 134–144)
Total Protein: 6 g/dL (ref 6.0–8.5)

## 2018-03-17 LAB — CBC WITH DIFFERENTIAL/PLATELET
Basophils Absolute: 0 10*3/uL (ref 0.0–0.2)
Basos: 0 %
EOS (ABSOLUTE): 0 10*3/uL (ref 0.0–0.4)
Eos: 0 %
Hematocrit: 42.4 % (ref 34.0–46.6)
Hemoglobin: 14.5 g/dL (ref 11.1–15.9)
IMMATURE GRANULOCYTES: 1 %
Immature Grans (Abs): 0.1 10*3/uL (ref 0.0–0.1)
Lymphocytes Absolute: 0.5 10*3/uL — ABNORMAL LOW (ref 0.7–3.1)
Lymphs: 5 %
MCH: 30.6 pg (ref 26.6–33.0)
MCHC: 34.2 g/dL (ref 31.5–35.7)
MCV: 90 fL (ref 79–97)
MONOS ABS: 0.2 10*3/uL (ref 0.1–0.9)
Monocytes: 2 %
Neutrophils Absolute: 9.6 10*3/uL — ABNORMAL HIGH (ref 1.4–7.0)
Neutrophils: 92 %
PLATELETS: 179 10*3/uL (ref 150–450)
RBC: 4.74 x10E6/uL (ref 3.77–5.28)
RDW: 13 % (ref 11.7–15.4)
WBC: 10.3 10*3/uL (ref 3.4–10.8)

## 2018-03-17 NOTE — Telephone Encounter (Signed)
PT rescheduled appt with Nurse

## 2018-03-18 ENCOUNTER — Encounter: Payer: Self-pay | Admitting: Pharmacist Clinician (PhC)/ Clinical Pharmacy Specialist

## 2018-03-19 ENCOUNTER — Emergency Department (HOSPITAL_COMMUNITY): Payer: Medicare Other

## 2018-03-19 ENCOUNTER — Telehealth: Payer: Self-pay | Admitting: Family

## 2018-03-19 ENCOUNTER — Encounter (HOSPITAL_COMMUNITY): Payer: Self-pay | Admitting: Emergency Medicine

## 2018-03-19 ENCOUNTER — Emergency Department (HOSPITAL_COMMUNITY)
Admission: EM | Admit: 2018-03-19 | Discharge: 2018-03-19 | Disposition: A | Discharge status: Home / Self Care | Payer: Medicare Other | Attending: Emergency Medicine | Admitting: Emergency Medicine

## 2018-03-19 ENCOUNTER — Telehealth: Payer: Self-pay | Admitting: *Deleted

## 2018-03-19 ENCOUNTER — Other Ambulatory Visit: Payer: Self-pay

## 2018-03-19 DIAGNOSIS — R0602 Shortness of breath: Secondary | ICD-10-CM | POA: Diagnosis not present

## 2018-03-19 DIAGNOSIS — I10 Essential (primary) hypertension: Secondary | ICD-10-CM | POA: Diagnosis not present

## 2018-03-19 DIAGNOSIS — J449 Chronic obstructive pulmonary disease, unspecified: Secondary | ICD-10-CM | POA: Diagnosis not present

## 2018-03-19 DIAGNOSIS — J209 Acute bronchitis, unspecified: Secondary | ICD-10-CM

## 2018-03-19 DIAGNOSIS — R05 Cough: Secondary | ICD-10-CM | POA: Diagnosis present

## 2018-03-19 DIAGNOSIS — F329 Major depressive disorder, single episode, unspecified: Secondary | ICD-10-CM | POA: Diagnosis not present

## 2018-03-19 DIAGNOSIS — J189 Pneumonia, unspecified organism: Secondary | ICD-10-CM | POA: Diagnosis not present

## 2018-03-19 DIAGNOSIS — Z8543 Personal history of malignant neoplasm of ovary: Secondary | ICD-10-CM | POA: Diagnosis not present

## 2018-03-19 DIAGNOSIS — Z79899 Other long term (current) drug therapy: Secondary | ICD-10-CM | POA: Insufficient documentation

## 2018-03-19 DIAGNOSIS — J40 Bronchitis, not specified as acute or chronic: Secondary | ICD-10-CM | POA: Diagnosis not present

## 2018-03-19 DIAGNOSIS — E039 Hypothyroidism, unspecified: Secondary | ICD-10-CM | POA: Diagnosis not present

## 2018-03-19 DIAGNOSIS — Z950 Presence of cardiac pacemaker: Secondary | ICD-10-CM | POA: Insufficient documentation

## 2018-03-19 DIAGNOSIS — Z7901 Long term (current) use of anticoagulants: Secondary | ICD-10-CM | POA: Diagnosis not present

## 2018-03-19 LAB — CBC
HCT: 43.3 % (ref 36.0–46.0)
Hemoglobin: 14.3 g/dL (ref 12.0–15.0)
MCH: 29.7 pg (ref 26.0–34.0)
MCHC: 33 g/dL (ref 30.0–36.0)
MCV: 89.8 fL (ref 80.0–100.0)
Platelets: 238 10*3/uL (ref 150–400)
RBC: 4.82 MIL/uL (ref 3.87–5.11)
RDW: 13.2 % (ref 11.5–15.5)
WBC: 11.2 10*3/uL — ABNORMAL HIGH (ref 4.0–10.5)
nRBC: 0 % (ref 0.0–0.2)

## 2018-03-19 LAB — BASIC METABOLIC PANEL
Anion gap: 10 (ref 5–15)
BUN: 22 mg/dL (ref 8–23)
CALCIUM: 9 mg/dL (ref 8.9–10.3)
CO2: 27 mmol/L (ref 22–32)
Chloride: 100 mmol/L (ref 98–111)
Creatinine, Ser: 0.82 mg/dL (ref 0.44–1.00)
GFR calc non Af Amer: >60 mL/min (ref >60)
Glucose, Bld: 143 mg/dL — ABNORMAL HIGH (ref 70–99)
Potassium: 3 mmol/L — ABNORMAL LOW (ref 3.5–5.1)
Sodium: 137 mmol/L (ref 135–145)

## 2018-03-19 LAB — TROPONIN I: Troponin I: <0.03 ng/mL (ref <0.03)

## 2018-03-19 LAB — BRAIN NATRIURETIC PEPTIDE: B Natriuretic Peptide: 692 pg/mL — ABNORMAL HIGH (ref 0.0–100.0)

## 2018-03-19 MED ORDER — PREDNISONE 10 MG (21) PO TBPK: ORAL_TABLET | ORAL | 0 refills | Status: AC

## 2018-03-19 MED ORDER — ALBUTEROL SULFATE (2.5 MG/3ML) 0.083% IN NEBU: 2.5000 mg | INHALATION_SOLUTION | Freq: Four times a day (QID) | RESPIRATORY_TRACT | 12 refills | Status: DC | PRN

## 2018-03-19 MED ORDER — PREDNISONE 50 MG PO TABS
50.0000 mg | ORAL_TABLET | Freq: Once | ORAL | Status: AC
  Administered 2018-03-19: 50 mg via ORAL
  Filled 2018-03-19: qty 1

## 2018-03-19 MED ORDER — IPRATROPIUM-ALBUTEROL 0.5-2.5 (3) MG/3ML IN SOLN
3.0000 mL | Freq: Once | RESPIRATORY_TRACT | Status: AC
  Administered 2018-03-19: 3 mL via RESPIRATORY_TRACT
  Filled 2018-03-19: qty 3

## 2018-03-19 NOTE — Discharge Instructions (Addendum)
Take the medications as prescribed, follow-up with your doctor to make sure you are improving, return to the ED as needed for worsening symptoms

## 2018-03-19 NOTE — Telephone Encounter (Signed)
Patient states she had sharp chest pain and spasms when taking clindamycin with food.  Advised patient that she needs to go to the ED to be evaluated.  Patient declines and states that she has no one to take her and that she does not want to call EMS.

## 2018-03-19 NOTE — ED Provider Notes (Signed)
[facility] EMERGENCY DEPARTMENT Provider Note   CSN: [id number] Arrival date & time: 03/19/18  1748    History   Chief Complaint Chief Complaint  Patient presents with  . Shortness of Breath    HPI Angela Wong is a 78 y.o. female.   HPI Patient presents to the emergency room for evaluation of persistent cough and shortness of breath.  Patient states her symptoms started before the weekend.  She saw her doctor who treated her for bronchitis.  Patient was instructed to start taking azithromycin as well as steroids.  Patient states she felt like she was getting worse this week so she followed up with her doctor again.  Her doctor listened to her thought she was developing pneumonia.  She was instructed to start taking clindamycin.  Yesterday the patient started having some pain in her chest and her jaw.  She thought it could be a reaction to the clindamycin.  She spoke to her doctor today who suggested she come to the ED for further evaluation.  Patient denies any leg swelling.  No vomiting or diarrhea.  He does have a history of recurrent breathing issues associated with asthma.  She was exposed to some mold recently and is not sure if that triggered some of her symptoms.  Patient has a history of multiple allergies to multiple medications. Past Medical History:  Diagnosis Date  . A-fib ([place])   . Asthma   . Cerebral vasculitis   . Congestive dilated cardiomyopathy ([place])   . COPD (chronic obstructive pulmonary disease) ([place])   . Fibromyalgia   . Gastric polyp   . GERD (gastroesophageal reflux disease)   . Hiatal hernia   . Hyperparathyroidism ([place])   . IBS (irritable bowel syndrome)   . MVP (mitral valve prolapse)   . Osteoarthritis   . Ovarian cancer ([place])    lymph node removal with hysterectomy  . Raynaud's disease   . RLS (restless legs syndrome)   . Situational depression   . Sjogren's syndrome ([place])   . Vasculitis ([place])   . Vitamin D deficiency     Patient Active  Problem List   Diagnosis Date Noted  . Anticoagulation goal of INR 2 to 3 12/18/2017  . Depression, major, single episode, moderate ([place]) 09/12/2017  . Chest pain 08/25/2017  . Generalized weakness 08/25/2017  . Seasonal and perennial allergic rhinitis 12/31/2016  . Allergic rhinitis with a nonallergic component 10/01/2016  . Itching 10/01/2016  . 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 Steroid-induced osteopenia 07/11/2016  . Hyperparathyroidism ([place]) 01/16/2016  . Iron deficiency anemia 12/18/2015  . Insomnia 12/18/2015  . Hypothyroidism 12/18/2015  . Rotator cuff tear 03/23/2014  . Osteoarthritis   . Moderate persistent asthma without complication   . Ovarian cancer ([place])   . Fibromyalgia   . RLS (restless legs syndrome)   . Cerebral vasculitis   . Sjogren's syndrome ([place])   . Raynaud's disease   . MVP (mitral valve prolapse)   . IBS (irritable bowel syndrome)   . Congestive dilated cardiomyopathy ([place]) 02/09/2014  . Dyspnea 12/13/2013  . Hyperlipidemia 11/26/2013  . Malaise and fatigue 11/26/2013  . Allergy to multiple antibiotics 11/02/2013  . History of ovarian cancer 10/01/2013  . GERD (gastroesophageal reflux disease) 10/01/2013  . Benign essential HTN 10/01/2013  . Atrial fibrillation, permanent 07/27/2013    Past Surgical History:  Procedure Laterality Date  . ABDOMINAL HYSTERECTOMY  1982   with right oophorectomy  . APPENDECTOMY    . BREAST BIOPSY  Right    x 2  . BREAST SURGERY     Biopsy  . CARPAL TUNNEL RELEASE Right 1980  . CARPAL TUNNEL RELEASE Left 2010   x 2  . CATARACT EXTRACTION Bilateral   . CESAREAN SECTION     x 3  . KNEE SURGERY Left 2005  . OOPHORECTOMY Left 1962  . PACEMAKER INSERTION  09/15/2014  . REFRACTIVE SURGERY Bilateral 2014  . TUBAL LIGATION       OB History   No obstetric history on file.      Home Medications    Prior to Admission medications   Medication Sig Start Date End Date Taking? Authorizing Provider  albuterol  (PROVENTIL HFA;VENTOLIN HFA) 108 (90 Base) MCG/ACT inhaler Inhale 2 puffs into the lungs every 6 (six) hours as needed for wheezing. 10/01/16  Yes Bobbitt, Sedalia Muta, MD  atenolol (TENORMIN) 25 MG tablet Take 12.5 mg by mouth daily. *May take additional 12.5mg  daily as needed for A-Fib 03/11/16  Yes [provider]  B Complex-C (B-COMPLEX WITH VITAMIN C) tablet Take 1 tablet by mouth every evening.    Yes [provider]  budesonide-formoterol (SYMBICORT) 160-4.5 MCG/ACT inhaler Symbicort 160 mcg-4.5 mcg/actuation HFA aerosol inhaler  INHALE 2 PUFFS BY MOUTH TWICE DAILY Patient taking differently: Inhale 2 puffs into the lungs 2 (two) times daily.  01/30/18  Yes Valentina Shaggy, MD  bumetanide (BUMEX) 1 MG tablet TAKE 1 TABLET BY MOUTH EVERY OTHER DAY ON OPPOSITE DAY OF 0.5 MG DOSE Patient taking differently: Take 0.5-1 mg by mouth See admin instructions. Alternate taking 1mg  with 0.5mg  daily in the morning 11/07/17  Yes Hawks, Christy A, FNP  Cholecalciferol (VITAMIN D) 2000 UNITS CAPS Take 2 capsules by mouth every evening.    Yes [provider]  clindamycin (CLEOCIN) 300 MG capsule Take 1 capsule (300 mg total) by mouth 3 (three) times daily. 03/16/18  Yes Hawks, Christy A, FNP  cyclobenzaprine (FLEXERIL) 5 MG tablet Take 0.5 tablets (2.5 mg total) by mouth 2 (two) times daily as needed for muscle spasms. 08/01/17  Yes Eustaquio Maize, MD  docusate sodium (COLACE) 100 MG capsule Take 400 mg by mouth daily.    Yes [provider]  esomeprazole (NEXIUM) 40 MG capsule Take 1 capsule (40 mg total) by mouth daily. 10/22/17  Yes Pyrtle, Lajuan Lines, MD  famotidine (PEPCID) 20 MG tablet Take 1 tablet (20 mg total) by mouth every evening. 01/05/18  Yes Hawks, Christy A, FNP  fexofenadine (ALLEGRA) 180 MG tablet Take 180 mg by mouth every morning.    Yes [provider]  Iron Polysacch Cmplx-B12-FA (POLY-IRON 150 FORTE) 150-0.025-1 MG CAPS Take 1 tablet by mouth  daily. Patient taking differently: Take 1 tablet by mouth every evening.  03/07/17  Yes Bozovich, Sharyn Lull, PharmD  levothyroxine (SYNTHROID, LEVOTHROID) 50 MCG tablet Take 2 tablets (100 mcg total) by mouth daily. Patient taking differently: Take 100 mcg by mouth every morning.  09/16/17  Yes Hawks, Christy A, FNP  losartan (COZAAR) 100 MG tablet Take 1 tablet (100 mg total) by mouth daily. 01/05/18  Yes Hawks, Christy A, FNP  Magnesium 200 MG TABS Take 400 mg by mouth every evening.    Yes [provider]  metroNIDAZOLE (METROGEL) 1 % gel Apply topically daily. Patient taking differently: Apply 1 application topically daily. Apply to face 01/05/18  Yes Hawks, [name] A, FNP  montelukast (SINGULAIR) 10 MG tablet Take 1 tablet (10 mg total) by mouth at bedtime.  01/30/18  Yes Valentina Shaggy, MD  Olopatadine HCl 0.2 % SOLN Apply 1 drop to eye daily as needed (for allergy eye).    Yes [provider]  predniSONE (DELTASONE) 5 MG tablet Take 5 mg by mouth every morning.   Yes [provider]  PREVIDENT 5000 DRY MOUTH 1.1 % GEL dental gel Place 1 application onto teeth as needed (as needed for dry mouth).  10/13/13  Yes [provider]  RESTASIS 0.05 % ophthalmic emulsion INSTILL 1 DROP INTO EACH EYE TWICE DAILY Patient taking differently: Place 1 drop into both eyes 2 (two) times daily.  10/03/17  Yes Hawks, Christy A, FNP  Simethicone (GAS-X EXTRA STRENGTH) 125 MG CAPS Take 2 capsules by mouth daily as needed (for relief).    Yes [provider]  traZODone (DESYREL) 150 MG tablet Take 1 tablet (150 mg total) by mouth at bedtime. 01/05/18  Yes Hawks, Christy A, FNP  warfarin (COUMADIN) 1 MG tablet TAKE 2 TO 4 TABLETS (= 2 TO 4MG ) BY MOUTH ONCE DAILY AS DIRECTED BY ANTICOGULATION CLINIC. Patient taking differently: Take 2-4 mg by mouth See admin instructions. MWF take 4mg . Take 2mg  on all other days. 03/07/17  Yes Memory Argue, PharmD  albuterol  (PROVENTIL) (2.5 MG/3ML) 0.083% nebulizer solution Take 3 mLs (2.5 mg total) by nebulization every 6 (six) hours as needed for wheezing or shortness of breath. 03/19/18   Dorie Rank, MD  azithromycin (ZITHROMAX) 250 MG tablet Take 250-500 mg by mouth See admin instructions. Take 500 mg on day 1 then take 250mg  on days 2 through 5    [provider]  lidocaine (LIDODERM) 5 % Place 1 patch onto the skin daily. Remove & Discard patch within 12 hours or as directed by MD Patient not taking: Reported on 03/19/2018 12/24/17   Kathie Dike, MD  predniSONE (STERAPRED UNI-PAK 21 TAB) 10 MG (21) TBPK tablet Take 5 tablets (50 mg total) by mouth daily for 3 days, THEN 4 tablets (40 mg total) daily for 3 days, THEN 3 tablets (30 mg total) daily for 3 days, THEN 2 tablets (20 mg total) daily for 3 days, THEN 1 tablet (10 mg total) daily for 3 days, THEN 1 tablet (10 mg total) daily for 3 days. 03/19/18 04/06/18  Dorie Rank, MD    Family History Family History  Problem Relation Age of Onset  . COPD Mother   . Breast cancer Mother   . Allergic rhinitis Mother   . Heart disease Father        No details  . Kidney disease Father   . Allergic rhinitis Father   . Stroke Sister   . Arthritis/Rheumatoid Sister   . Asthma Sister   . Lupus Sister   . Heart attack Sister   . Stroke Paternal Grandmother   . Scleroderma Grandchild   . Thyroid disease Other   . Breast cancer Maternal Aunt        x 2    Social History Social History   Tobacco Use  . Smoking status: Never Smoker  . Smokeless tobacco: Never Used  Substance Use Topics  . Alcohol use: No  . Drug use: No     Allergies   Cephalosporins; Ciprofloxacin; Diltiazem; Doxycycline; Horse-derived products; Ketek [telithromycin]; Nitrofuran derivatives; Nitrous oxide; Other; Penicillins; Pentazocine; Sulfa antibiotics; Trovan [alatrofloxacin]; Amlodipine; Calcium channel blockers; Carvedilol; Clindamycin/lincomycin; Codeine; Cymbalta [duloxetine  hcl]; Diovan [valsartan]; Lisinopril; Sertraline; Tramadol; and Loteprednol etabonate   Review of Systems Review of Systems  All other systems reviewed and are negative.    Physical Exam Updated Vital Signs BP 140/76   Pulse 62   Temp 98.5 F (36.9 C) (Oral)   Resp 14   Ht 1.626 m (5\' 4" )   Wt 61.2 kg   SpO2 92%   BMI 23.17 kg/m   Physical Exam Vitals signs and nursing note reviewed.  Constitutional:      General: She is not in acute distress.    Appearance: She is well-developed.  HENT:     Head: Normocephalic and atraumatic.     Right Ear: External ear normal.     Left Ear: External ear normal.  Eyes:     General: No scleral icterus.       Right eye: No discharge.        Left eye: No discharge.     Conjunctiva/sclera: Conjunctivae normal.  Neck:     Musculoskeletal: Neck supple.     Trachea: No tracheal deviation.  Cardiovascular:     Rate and Rhythm: Normal rate and regular rhythm.  Pulmonary:     Effort: Pulmonary effort is normal. No respiratory distress.     Breath sounds: No stridor. Wheezing present. No rales.  Abdominal:     General: Bowel sounds are normal. There is no distension.     Palpations: Abdomen is soft.     Tenderness: There is no abdominal tenderness. There is no guarding or rebound.  Musculoskeletal:        General: No tenderness.  Skin:    General: Skin is warm and dry.     Findings: No rash.  Neurological:     Mental Status: She is alert.     Cranial Nerves: No cranial nerve deficit (no facial droop, extraocular movements intact, no slurred speech).     Sensory: No sensory deficit.     Motor: No abnormal muscle tone or seizure activity.     Coordination: Coordination normal.      ED Treatments / Results  Labs (all labs ordered are listed, but only abnormal results are displayed) Labs Reviewed  CBC - Abnormal; Notable for the following components:      Result Value   WBC 11.2 (*)    All other components within normal limits    BASIC METABOLIC PANEL - Abnormal; Notable for the following components:   Potassium 3.0 (*)    Glucose, Bld 143 (*)    All other components within normal limits  BRAIN NATRIURETIC PEPTIDE - Abnormal; Notable for the following components:   B Natriuretic Peptide 692.0 (*)    All other components within normal limits  TROPONIN I    EKG None  Radiology Dg Chest 2 View  Result Date: 03/19/2018 CLINICAL DATA:  Shortness of breath EXAM: CHEST - 2 VIEW COMPARISON:  03/12/2018, 12/24/2017 FINDINGS: Right-sided pacing device as before. No acute opacity or pleural effusion. Stable cardiomediastinal silhouette. No pneumothorax. IMPRESSION: No active cardiopulmonary disease. Electronically Signed   By: Donavan Foil M.D.   On: 03/19/2018 19:02    Procedures Procedures (including critical care time)  Medications Ordered in ED Medications  ipratropium-albuterol (DUONEB) 0.5-2.5 (3) MG/3ML nebulizer solution 3 mL (3 mLs Nebulization Given 03/19/18 1826)  predniSONE (DELTASONE) tablet 50 mg (50 mg Oral Given 03/19/18 1905)     Initial Impression / Assessment and Plan / ED Course  I have reviewed the triage vital signs and the nursing notes.  Pertinent labs & imaging results that were available during my care of the  patient were reviewed by me and considered in my medical decision making (see chart for details).  Clinical Course as of Mar 19 1955  Thu Mar 19, 2018  1932 Laboratory tests reviewed.  Slight elevation white blood cell count.  Potassium slightly decreased.  Troponin is normal.  BNP is elevated   [JK]  1932 Chest x-ray without signs of pulmonary edema or pneumonia.   [JK]    Clinical Course User Index [JK] Dorie Rank, MD     Patient presented to the ED for evaluation of shortness of breath.  ED work-up is reassuring.  She does have an elevated BNP but does not have any findings to suggest pulmonary edema.  I doubt acute congestive heart failure.  X-ray does not show pneumonia.  I  suspect the patient symptoms are related to her bronchospasm.  She is breathing easily in the ED.  She does not have an oxygen requirement and is not tachypneic.  The patient is feeling better and she would prefer to go home.  She has been using her an albuterol inhaler but did find some good relief from the albuterol nebulizer.  Patient states she has a machine at home.  I will give her prescription for albuterol solution.  I will also have her take a prolonged steroid taper.  I think the patient can be safely managed as an outpatient.    Final Clinical Impressions(s) / ED Diagnoses   Final diagnoses:  Bronchitis with bronchospasm    ED Discharge Orders         Ordered    predniSONE (STERAPRED UNI-PAK 21 TAB) 10 MG (21) TBPK tablet     03/19/18 1957    albuterol (PROVENTIL) (2.5 MG/3ML) 0.083% nebulizer solution  Every 6 hours PRN     03/19/18 1957           Dorie Rank, MD 03/19/18 [phone]

## 2018-03-19 NOTE — Telephone Encounter (Signed)
Pt needs to go to ED. 

## 2018-03-19 NOTE — ED Triage Notes (Signed)
Pt was recently tx for bronchitis, which turned into pneumonia. Pt states she was taking Clindamycin and stopped taking it d/t lock jaw. PCP referred her to ED d/t list of allergies and "didn't know what else to put her on". Dry cough, no fever.

## 2018-03-19 NOTE — Telephone Encounter (Signed)
Spoke with pt from previous encounter regarding chest pain Per pt, BP is 159 108 P 109 with chest pain radiating into L jaw Per [name] pt instucted to go to ER for evaluation Pt verbalizes understanding

## 2018-03-24 ENCOUNTER — Other Ambulatory Visit: Payer: Self-pay | Admitting: Family

## 2018-03-24 ENCOUNTER — Telehealth: Payer: Self-pay | Admitting: Family

## 2018-03-24 ENCOUNTER — Other Ambulatory Visit: Payer: Self-pay | Admitting: Pharmacist Clinician (PhC)/ Clinical Pharmacy Specialist

## 2018-03-24 NOTE — Telephone Encounter (Signed)
I'm sorry, but our office policy is that our lab staff can not leave the building.   Letter written and ready to be faxed.

## 2018-03-24 NOTE — Telephone Encounter (Signed)
Letter faxed and patient aware.

## 2018-03-25 ENCOUNTER — Encounter: Payer: Medicare Other | Admitting: Pharmacist Clinician (PhC)/ Clinical Pharmacy Specialist

## 2018-03-30 ENCOUNTER — Ambulatory Visit: Payer: Medicare Other | Admitting: Family

## 2018-04-09 ENCOUNTER — Other Ambulatory Visit: Payer: Self-pay

## 2018-04-09 ENCOUNTER — Telehealth: Payer: Medicare Other | Admitting: Internal Medicine

## 2018-04-14 ENCOUNTER — Ambulatory Visit (INDEPENDENT_AMBULATORY_CARE_PROVIDER_SITE_OTHER): Payer: Medicare Other | Admitting: Internal Medicine

## 2018-04-14 ENCOUNTER — Other Ambulatory Visit: Payer: Self-pay

## 2018-04-14 ENCOUNTER — Encounter: Payer: Self-pay | Admitting: Internal Medicine

## 2018-04-14 DIAGNOSIS — K30 Functional dyspepsia: Secondary | ICD-10-CM | POA: Diagnosis not present

## 2018-04-14 DIAGNOSIS — K219 Gastro-esophageal reflux disease without esophagitis: Secondary | ICD-10-CM | POA: Diagnosis not present

## 2018-04-14 DIAGNOSIS — K2960 Other gastritis without bleeding: Secondary | ICD-10-CM | POA: Diagnosis not present

## 2018-04-14 DIAGNOSIS — K582 Mixed irritable bowel syndrome: Secondary | ICD-10-CM | POA: Diagnosis not present

## 2018-04-14 DIAGNOSIS — R140 Abdominal distension (gaseous): Secondary | ICD-10-CM

## 2018-04-14 DIAGNOSIS — T380X5A Adverse effect of glucocorticoids and synthetic analogues, initial encounter: Secondary | ICD-10-CM

## 2018-04-14 MED ORDER — ESOMEPRAZOLE MAGNESIUM 40 MG PO CPDR: 40.0000 mg | DELAYED_RELEASE_CAPSULE | Freq: Two times a day (BID) | ORAL | 0 refills | Status: DC

## 2018-04-14 NOTE — Addendum Note (Signed)
Addended by: Larina Bras on: 04/14/2018 01:06 PM   Modules accepted: Orders

## 2018-04-14 NOTE — Patient Instructions (Addendum)
Please purchase the following medications over the counter and take as directed: Benefiber 1 tablespoon daily.  We have sent the following medications to your pharmacy for you to pick up at your convenience: Nexium 40 mg twice daily   Take pepcid only as needed for breakthrough reflux.  Decrease dulcolax to only as needed dosing for constipation.  Please follow up in 6-8 weeks with Dr Hilarie Fredrickson.

## 2018-04-14 NOTE — Progress Notes (Signed)
Subjective:    Patient ID: Angela Wong, female    DOB: 1941/01/07, 78 y.o.   MRN: [id number]  This service was provided via telemedicine using the [employer] with A/V communication.  Due to slow connections speed the audiovisual portion was unable to be maintained for the entire visit. The patient was located at home The provider was located in [place] office The patient did consent to this telephone visit and is aware of possible charges through their insurance for this visit.   The other persons participating in this telemedicine service were myself, patient, Angela Wong, CMA  Time spent on call: 31 min   HPI [name] is a 78 year old female with a history of GERD, IBS, remote adenomatous colon polyps, atrial fibrillation on warfarin, Sjogren's syndrome, COPD, remote ovarian cancer, hypoparathyroidism, fibromyalgia who seen virtually for follow-up.  She was last seen in the office on 10/22/2017.  She reports that she has had issues with bronchitis and was diagnosed with pneumonia ultimately leading to an ER visit on 03/19/2018.  Ultimately at this visit x-ray did not show pneumonia though prior to this she had developed a cough, allergy symptoms.  Was treated with a Z-Pak and prednisone for several weeks.  Eventually prednisone was increased to as high as 50 mg/day and tapered from there.  She was also placed on an albuterol inhaler.  Her breathing has improved though she continues to have postnasal drip, sinus type symptoms and a nonproductive cough.  No fevers.  She reports significant anxiety related to the COVID-19 virus.  She also worries about her sons 1 who has diabetes, another who lost his job and yet another who was supposed to have surgery but this is been put on hold in light of the pandemic.  She has had more issues with heartburn, some indigestion and upper abdominal discomfort.  This worsened with the steroids.  She has been using Nexium 40 mg a day and famotidine  20 mg in the evening.  Famotidine does not work as well for her as previous ranitidine.  Ranitidine was changed after being removed from the market.  No dysphagia or odynophagia.  No nausea or vomiting.  Appetite is intact.  She tried IBgard for her IBS but this gave her more indigestion.  Bowel movements vary from loose to at other times feeling constipation with incomplete evacuation.  Stools remain fairly dark while on oral iron.  She is taking 4 Dulcolax per day which is her usual regimen.  She was taking Benefiber but has been off since her bronchitis diagnosis.  She has not seen blood or melena in her stool.  She has frequent joint pains in her knees bilaterally, hips, lower back.  She uses CBD gel and also Gummies which helps some.  Previously she was using THC product obtained medically in [place] but she understands this is not available here though this was much more helpful than current CBD product.  She is not taking narcotic pain medication and avoids NSAIDs.   Review of Systems As per HPI, otherwise negative  Current Medications, Allergies, Past Medical History, Past Surgical History, Family History and Social History were reviewed in [employer] record.     Objective:   Physical Exam No physical exam, virtual visit  CBC    Component Value Date/Time   WBC 11.2 (H) 03/19/2018 1834   RBC 4.82 03/19/2018 1834   HGB 14.3 03/19/2018 1834   HGB 14.5 03/16/2018 1652   HCT 43.3  03/19/2018 1834   HCT 42.4 03/16/2018 1652   PLT 238 03/19/2018 1834   PLT 179 03/16/2018 1652   MCV 89.8 03/19/2018 1834   MCV 90 03/16/2018 1652   MCH 29.7 03/19/2018 1834   MCHC 33.0 03/19/2018 1834   RDW 13.2 03/19/2018 1834   RDW 13.0 03/16/2018 1652   LYMPHSABS 0.5 (L) 03/16/2018 1652   MONOABS 0.7 12/24/2017 0224   EOSABS 0.0 03/16/2018 1652   BASOSABS 0.0 03/16/2018 1652   CMP     Component Value Date/Time   NA 137 03/19/2018 1834   NA 135 03/16/2018 1652   K  3.0 (L) 03/19/2018 1834   CL 100 03/19/2018 1834   CO2 27 03/19/2018 1834   GLUCOSE 143 (H) 03/19/2018 1834   BUN 22 03/19/2018 1834   BUN 11 03/16/2018 1652   CREATININE 0.82 03/19/2018 1834   CREATININE 0.74 01/24/2014 1135   CALCIUM 9.0 03/19/2018 1834   PROT 6.0 03/16/2018 1652   ALBUMIN 3.9 03/16/2018 1652   AST 21 03/16/2018 1652   ALT 16 03/16/2018 1652   ALKPHOS 73 03/16/2018 1652   BILITOT 0.6 03/16/2018 1652   GFRNONAA >60 03/19/2018 1834   GFRAA >60 03/19/2018 1834     CT ANGIOGRAPHY CHEST, ABDOMEN AND PELVIS   TECHNIQUE: Multidetector CT imaging through the chest, abdomen and pelvis was performed using the standard protocol during bolus administration of intravenous contrast. Multiplanar reconstructed images and MIPs were obtained and reviewed to evaluate the vascular anatomy.   CONTRAST:  163mL ISOVUE-370 IOPAMIDOL (ISOVUE-370) INJECTION 76%   COMPARISON:  08/01/2017 thoracic spine radiographs. 08/25/2017 chest radiograph.   FINDINGS: CTA CHEST FINDINGS   Cardiovascular: Preferential opacification of the thoracic aorta. No evidence of thoracic aortic aneurysm or dissection. Moderate cardiomegaly. Single lead pacemaker with lead in the right ventricle. Mild coronary artery calcific atherosclerosis. Satisfactory opacification of pulmonary arteries, no pulmonary embolus identified.   Mediastinum/Nodes: Mild prominence of mediastinal lymph nodes. Patent central airways. Normal thyroid gland. Normal thoracic esophagus.   Lungs/Pleura: Platelike atelectasis in the lung bases. Trace right pleural effusion.   Musculoskeletal: Chronic T12 superior endplate fracture with mild loss of vertebral body height.   Review of the MIP images confirms the above findings.   CTA ABDOMEN AND PELVIS FINDINGS   VASCULAR   Aorta: Normal caliber aorta without aneurysm, dissection, vasculitis or significant stenosis.   Celiac: Patent without evidence of aneurysm,  dissection, vasculitis or significant stenosis. Separate origin of splenic and hepatic arteries.   SMA: Patent without evidence of aneurysm, dissection, vasculitis or significant stenosis.   Renals: Both renal arteries are patent without evidence of aneurysm, dissection, vasculitis, fibromuscular dysplasia or significant stenosis.   IMA: Patent without evidence of aneurysm, dissection, vasculitis or significant stenosis.   Inflow: Patent without evidence of aneurysm, dissection, vasculitis or significant stenosis.   Veins: Multiple calcified phleboliths along the course of the right ovarian vein.   Review of the MIP images confirms the above findings.   NON-VASCULAR   Hepatobiliary: 10 mm cyst within the right lobe of liver. Otherwise no focal liver abnormality is seen. No gallstones, gallbladder wall thickening, or biliary dilatation.   Pancreas: Unremarkable. No pancreatic ductal dilatation or surrounding inflammatory changes.   Spleen: Normal in size without focal abnormality.   Adrenals/Urinary Tract: Normal adrenal glands. Right kidney upper pole 17 mm and lower pole 11 mm cyst. Multiple segments of left kidney cortical scarring. No urinary stone disease or hydronephrosis. Normal bladder.   Stomach/Bowel: Stomach is within  normal limits. Appendix not identified, no pericecal inflammation. No evidence of bowel wall thickening, distention, or inflammatory changes. Pan colonic diverticulosis. No findings of acute diverticulitis.   Lymphatic: Aortic atherosclerosis. No enlarged abdominal or pelvic lymph nodes.   Reproductive: Status post hysterectomy. No adnexal masses.   Other: No abdominal wall hernia or abnormality. No abdominopelvic ascites.   Musculoskeletal: Mild right and moderate left hip joint osteoarthrosis with loss of the joint space, fibrocystic degeneration of articular surfaces, and osteophytosis. L4-5 grade 1 anterolisthesis with prominent facet  arthropathy. Multifactorial severe L4-5 canal stenosis and moderate to severe L3-4 spinal canal stenosis.   Review of the MIP images confirms the above findings.   IMPRESSION: 1. No evidence of aortic aneurysm, dissection, or vasculitis. 2. No acute process identified as explanation for pain. 3. Trace right pleural effusion. Platelike atelectasis in the lung bases. 4. Moderate cardiomegaly. 5. Pan colonic diverticulosis without evidence of acute diverticulitis. 6. Lumbar spondylosis with severe L4-5 canal stenosis and moderate to severe L3-4 spinal canal stenosis.     Electronically Signed   By: Kristine Garbe M.D.   On: 12/24/2017 03:56     Assessment & Plan:  78 year old female with a history of GERD, IBS, remote adenomatous colon polyps, atrial fibrillation on warfarin, Sjogren's syndrome, COPD, remote ovarian cancer, hypoparathyroidism, fibromyalgia who seen virtually for follow-up.   1.  GERD/indigestion/probable steroid related gastritis --increase Nexium from 40 mg once a day to twice daily AC.  She can change famotidine to 20 mg in the evening as needed for breakthrough symptoms of indigestion or heartburn.  I explained how steroids can certainly affect the gut lining and predispose to gastroduodenitis.  She should continue to avoid NSAIDs.  If not improving she is asked to call me.  2.  IBS with alternating bowel habits both constipation predominant --worse and exacerbated by the stress of the COVID-19 pandemic along with her chronic medical conditions.  I will have her add back Benefiber she was previously on 1 teaspoon but increase this to 1 tablespoon daily.  She can continue Dulcolax on an as-needed basis per bottle instruction.  She also uses Gas-X which seems to help her with gas and bloating.  Improving bowel function will also help with bloating.  Oral iron likely contributing to constipation.  IBgard contains peppermint which likely worsened her indigestion so  we will discontinue it for now.  6 to 8-week follow-up in the office if able, virtual visit if needed.  40 minutes spent today. Greater than 50% was spent in counseling and coordination of care with the patient

## 2018-04-30 ENCOUNTER — Other Ambulatory Visit: Payer: Self-pay | Admitting: Family

## 2018-05-05 ENCOUNTER — Other Ambulatory Visit: Payer: Self-pay | Admitting: Family

## 2018-05-07 ENCOUNTER — Other Ambulatory Visit: Payer: Self-pay | Admitting: Family

## 2018-06-01 DIAGNOSIS — Z45010 Encounter for checking and testing of cardiac pacemaker pulse generator [battery]: Secondary | ICD-10-CM | POA: Diagnosis not present

## 2018-06-01 DIAGNOSIS — Z45018 Encounter for adjustment and management of other part of cardiac pacemaker: Secondary | ICD-10-CM | POA: Diagnosis not present

## 2018-06-09 ENCOUNTER — Other Ambulatory Visit: Payer: Self-pay

## 2018-06-10 ENCOUNTER — Encounter: Payer: Self-pay | Admitting: Pharmacist Clinician (PhC)/ Clinical Pharmacy Specialist

## 2018-06-10 ENCOUNTER — Ambulatory Visit (INDEPENDENT_AMBULATORY_CARE_PROVIDER_SITE_OTHER): Payer: Medicare Other | Admitting: Pharmacist Clinician (PhC)/ Clinical Pharmacy Specialist

## 2018-06-10 ENCOUNTER — Other Ambulatory Visit: Payer: Self-pay

## 2018-06-10 ENCOUNTER — Ambulatory Visit (INDEPENDENT_AMBULATORY_CARE_PROVIDER_SITE_OTHER): Payer: Medicare Other | Admitting: Family Medicine

## 2018-06-10 ENCOUNTER — Encounter: Payer: Self-pay | Admitting: Family Medicine

## 2018-06-10 VITALS — BP 140/77 | HR 64 | Temp 98.3°F | Ht 64.0 in | Wt 136.2 lb

## 2018-06-10 DIAGNOSIS — I4821 Permanent atrial fibrillation: Secondary | ICD-10-CM

## 2018-06-10 DIAGNOSIS — R231 Pallor: Secondary | ICD-10-CM | POA: Diagnosis not present

## 2018-06-10 DIAGNOSIS — I7300 Raynaud's syndrome without gangrene: Secondary | ICD-10-CM | POA: Diagnosis not present

## 2018-06-10 LAB — COAGUCHEK XS/INR WAIVED
INR: 1.8 — ABNORMAL HIGH (ref 0.9–1.1)
Prothrombin Time: 21.2 s

## 2018-06-10 NOTE — Patient Instructions (Signed)
Description   Resume old schedule of warfarin taking it the following way:  2mg  Tuesdays, Thursdays, and Sundays and 4mg  all other days of the week.  INR was 1.8 today (goal is 2.0 to 3.0)

## 2018-06-10 NOTE — Patient Instructions (Signed)
Raynaud Phenomenon    Raynaud phenomenon is a condition that affects the blood vessels (arteries) that carry blood to your fingers and toes. The arteries that supply blood to your ears, lips, nipples, or the tip of your nose might also be affected. Raynaud phenomenon causes the arteries to become narrow temporarily (spasm). As a result, the flow of blood to the affected areas is temporarily decreased. This usually occurs in response to cold temperatures or stress. During an attack, the skin in the affected areas turns white, then blue, and finally red. You may also feel tingling or numbness in those areas.  Attacks usually last for only a brief period, and then the blood flow to the area returns to normal. In most cases, Raynaud phenomenon does not cause serious health problems.  What are the causes?  In many cases, the cause of this condition is not known. The condition may occur on its own (primary Raynaud phenomenon) or may be associated with other diseases or factors (secondary Raynaud phenomenon).  Possible causes may include:  · Diseases or medical conditions that damage the arteries.  · Injuries and repetitive actions that hurt the hands or feet.  · Being exposed to certain chemicals.  · Taking medicines that narrow the arteries.  · Other medical conditions, such as lupus, scleroderma, rheumatoid arthritis, thyroid problems, blood disorders, Sjogren syndrome, or atherosclerosis.  What increases the risk?  The following factors may make you more likely to develop this condition:  · Being 20-40 years old.  · Being female.  · Having a family history of Raynaud phenomenon.  · Living in a cold climate.  · Smoking.  What are the signs or symptoms?  Symptoms of this condition usually occur when you are exposed to cold temperatures or when you have emotional stress. The symptoms may last for a few minutes or up to several hours. They usually affect your fingers but may also affect your toes, nipples, lips, ears, or  the tip of your nose. Symptoms may include:  · Changes in skin color. The skin in the affected areas will turn pale or white. The skin may then change from white to bluish to red as normal blood flow returns to the area.  · Numbness, tingling, or pain in the affected areas.  In severe cases, symptoms may include:  · Skin sores.  · Tissues decaying and dying (gangrene).  How is this diagnosed?  This condition may be diagnosed based on:  · Your symptoms and medical history.  · A physical exam. During the exam, you may be asked to put your hands in cold water to check for a reaction to cold temperature.  · Tests, such as:  ? Blood tests to check for other diseases or conditions.  ? A test to check the movement of blood through your arteries and veins (vascular ultrasound).  ? A test in which the skin at the base of your fingernail is examined under a microscope (nailfold capillaroscopy).  How is this treated?  Treatment for this condition often involves making lifestyle changes and taking steps to control your exposure to cold temperatures. For more severe cases, medicine (calcium channel blockers) may be used to improve blood flow. Surgery is sometimes done to block the nerves that control the affected arteries, but this is rare.  Follow these instructions at home:  Avoiding cold temperatures  Take these steps to avoid exposure to cold:  · If possible, stay indoors during cold weather.  · When you   go outside during cold weather, dress in layers and wear mittens, a hat, a scarf, and warm footwear.  · Wear mittens or gloves when handling ice or frozen food.  · Use holders for glasses or cans containing cold drinks.  · Let warm water run for a while before taking a shower or bath.  · Warm up the car before driving in cold weather.  Lifestyle    · If possible, avoid stressful and emotional situations. Try to find ways to manage your stress, such as:  ? Exercise.  ? Yoga.  ? Meditation.  ? Biofeedback.  · Do not use any  products that contain nicotine or tobacco, such as cigarettes and e-cigarettes. If you need help quitting, ask your health care provider.  · Avoid secondhand smoke.  · Limit your use of caffeine.  ? Switch to decaffeinated coffee, tea, and soda.  ? Avoid chocolate.  · Avoid vibrating tools and machinery.  General instructions  · Protect your hands and feet from injuries, cuts, or bruises.  · Avoid wearing tight rings or wristbands.  · Wear loose fitting socks and comfortable, roomy shoes.  · Take over-the-counter and prescription medicines only as told by your health care provider.  Contact a health care provider if:  · Your discomfort becomes worse despite lifestyle changes.  · You develop sores on your fingers or toes that do not heal.  · Your fingers or toes turn black.  · You have breaks in the skin on your fingers or toes.  · You have a fever.  · You have pain or swelling in your joints.  · You have a rash.  · Your symptoms occur on only one side of your body.  Summary  · Raynaud phenomenon is a condition that affects the arteries that carry blood to your fingers, toes, ears, lips, nipples, or the tip of your nose.  · In many cases, the cause of this condition is not known.  · Symptoms of this condition include changes in skin color, and numbness and tingling of the affected area.  · Treatment for this condition includes lifestyle changes, reducing exposure to cold temperatures, and using medicines for severe cases of the condition.  · Contact your health care provider if your condition worsens despite treatment.  This information is not intended to replace advice given to you by your health care provider. Make sure you discuss any questions you have with your health care provider.  Document Released: 12/29/1999 Document Revised: 07/16/2016 Document Reviewed: 02/12/2016  Elsevier Interactive Patient Education © 2019 [employer]

## 2018-06-10 NOTE — Progress Notes (Signed)
BP 140/77   Pulse 64   Temp 98.3 F (36.8 C) (Oral)   Ht 5\' 4"  (1.626 m)   Wt 136 lb 3.2 oz (61.8 kg)   BMI 23.38 kg/m    Subjective:   Patient ID: Angela Wong, female    DOB: [birth year], 78 y.o.   MRN: [id number]  HPI: Angela Wong is a 78 y.o. female presenting on 06/10/2018 for white patches on bilateral legs (Patient states it has been going on for months and is worse on left leg)   HPI White spots on legs Patient is complaining of white spots on her legs is been going on over the past couple months and is getting worse in her left leg and starting to go up her leg more.  She does have autoimmune and Raynaud's disease that is known and she is wondering if it is related to that or if it something else.  She has researched things such as scleroderma.  She was concerned about what this is that.  She denies any pain with it.  She has tried medications for the syndrome but it has not been painful  Relevant past medical, surgical, family and social history reviewed and updated as indicated. Interim medical history since our last visit reviewed. Allergies and medications reviewed and updated.  Review of Systems  Constitutional: Negative for chills and fever.  Eyes: Negative for visual disturbance.  Respiratory: Negative for chest tightness and shortness of breath.   Cardiovascular: Negative for chest pain and leg swelling.  Musculoskeletal: Negative for back pain and gait problem.  Skin: Positive for rash. Negative for color change.  Neurological: Negative for light-headedness and headaches.  Psychiatric/Behavioral: Negative for agitation and behavioral problems.  All other systems reviewed and are negative.   Per HPI unless specifically indicated above      Objective:   BP 140/77   Pulse 64   Temp 98.3 F (36.8 C) (Oral)   Ht 5\' 4"  (1.626 m)   Wt 136 lb 3.2 oz (61.8 kg)   BMI 23.38 kg/m   Wt Readings from Last 3 Encounters:  06/10/18 136 lb 3.2 oz (61.8 kg)   03/19/18 135 lb (61.2 kg)  03/12/18 139 lb 9.6 oz (63.3 kg)    Physical Exam Vitals signs and nursing note reviewed.  Constitutional:      General: She is not in acute distress.    Appearance: She is well-developed. She is not diaphoretic.  Eyes:     Conjunctiva/sclera: Conjunctivae normal.     Pupils: Pupils are equal, round, and reactive to light.  Cardiovascular:     Rate and Rhythm: Normal rate and regular rhythm.     Heart sounds: Normal heart sounds. No murmur.  Pulmonary:     Effort: Pulmonary effort is normal. No respiratory distress.     Breath sounds: Normal breath sounds. No wheezing.  Musculoskeletal: Normal range of motion.        General: No tenderness.  Skin:    General: Skin is warm and dry.     Findings: Rash (White lacy rash on both legs consistent with livedo reticularis) present.  Neurological:     Mental Status: She is alert and oriented to person, place, and time.     Coordination: Coordination normal.  Psychiatric:        Behavior: Behavior normal.     Assessment & Plan:   Problem List Items Addressed This Visit      Cardiovascular and Mediastinum  Raynaud's disease   Relevant Orders   CBC with Differential/Platelet   Sedimentation rate   High sensitivity CRP    Other Visit Diagnoses    Livedo reticularis without ulceration    -  Primary   Relevant Orders   CBC with Differential/Platelet   Sedimentation rate   High sensitivity CRP       Follow up plan: Return if symptoms worsen or fail to improve.  Counseling provided for all of the vaccine components No orders of the defined types were placed in this encounter.   Caryl Pina, MD [facility] 06/10/2018, 11:34 AM

## 2018-06-11 LAB — CBC WITH DIFFERENTIAL/PLATELET
Basophils Absolute: 0 10*3/uL (ref 0.0–0.2)
Basos: 0 %
EOS (ABSOLUTE): 0.1 10*3/uL (ref 0.0–0.4)
Eos: 1 %
Hematocrit: 42.5 % (ref 34.0–46.6)
Hemoglobin: 14.3 g/dL (ref 11.1–15.9)
Immature Grans (Abs): 0 10*3/uL (ref 0.0–0.1)
Immature Granulocytes: 0 %
Lymphocytes Absolute: 1.1 10*3/uL (ref 0.7–3.1)
Lymphs: 12 %
MCH: 30.5 pg (ref 26.6–33.0)
MCHC: 33.6 g/dL (ref 31.5–35.7)
MCV: 91 fL (ref 79–97)
Monocytes Absolute: 0.6 10*3/uL (ref 0.1–0.9)
Monocytes: 7 %
Neutrophils Absolute: 7.5 10*3/uL — ABNORMAL HIGH (ref 1.4–7.0)
Neutrophils: 80 %
Platelets: 211 10*3/uL (ref 150–450)
RBC: 4.69 x10E6/uL (ref 3.77–5.28)
RDW: 12.8 % (ref 11.7–15.4)
WBC: 9.4 10*3/uL (ref 3.4–10.8)

## 2018-06-11 LAB — SEDIMENTATION RATE: Sed Rate: 8 mm/hr (ref 0–40)

## 2018-06-11 LAB — HIGH SENSITIVITY CRP: CRP, High Sensitivity: 4.22 mg/L — ABNORMAL HIGH (ref 0.00–3.00)

## 2018-06-18 ENCOUNTER — Ambulatory Visit (INDEPENDENT_AMBULATORY_CARE_PROVIDER_SITE_OTHER): Payer: Medicare Other | Admitting: Family

## 2018-06-18 ENCOUNTER — Encounter: Payer: Self-pay | Admitting: Family

## 2018-06-18 ENCOUNTER — Other Ambulatory Visit: Payer: Self-pay

## 2018-06-18 DIAGNOSIS — R5383 Other fatigue: Secondary | ICD-10-CM

## 2018-06-18 DIAGNOSIS — I7300 Raynaud's syndrome without gangrene: Secondary | ICD-10-CM | POA: Diagnosis not present

## 2018-06-18 DIAGNOSIS — R5381 Other malaise: Secondary | ICD-10-CM

## 2018-06-18 DIAGNOSIS — H1132 Conjunctival hemorrhage, left eye: Secondary | ICD-10-CM

## 2018-06-18 DIAGNOSIS — J4540 Moderate persistent asthma, uncomplicated: Secondary | ICD-10-CM

## 2018-06-18 DIAGNOSIS — M797 Fibromyalgia: Secondary | ICD-10-CM

## 2018-06-18 DIAGNOSIS — M1990 Unspecified osteoarthritis, unspecified site: Secondary | ICD-10-CM | POA: Diagnosis not present

## 2018-06-18 DIAGNOSIS — R531 Weakness: Secondary | ICD-10-CM

## 2018-06-18 DIAGNOSIS — F321 Major depressive disorder, single episode, moderate: Secondary | ICD-10-CM | POA: Diagnosis not present

## 2018-06-18 DIAGNOSIS — M3500 Sicca syndrome, unspecified: Secondary | ICD-10-CM

## 2018-06-18 MED ORDER — MONTELUKAST SODIUM 10 MG PO TABS: 10.0000 mg | ORAL_TABLET | Freq: Every day | ORAL | 3 refills | Status: DC

## 2018-06-18 NOTE — Progress Notes (Signed)
Virtual Visit via telephone Note  I connected with Angela Wong on 06/18/18 at 2:21pm by telephone and verified that I am speaking with the correct person using two identifiers. Angela Wong is currently located at home and no one  is currently with her during visit. The provider, [name], FNP is located in their office at time of visit.  I discussed the limitations, risks, security and privacy concerns of performing an evaluation and management service by telephone and the availability of in person appointments. I also discussed with the patient that there may be a patient responsible charge related to this service. The patient expressed understanding and agreed to proceed.   History and Present Illness:  HPI PT calls the office today to discuss multiple problems. She wants me to be aware that she bent over last month and felt a "strain" in her eye. When she looked in the mirror she noticed her left eye was red. She states with her Sjogren's syndrome this happens often. She states the eye is greatly improved except one small area.   She is complaining of intermittent chronic back pain of 8 out 10. She states standing and walking makes this pain worse. She is having to walk with a cane and states she feels "wobbly" and scared she is going to fall.   She is complaining of fatigue and weakness. She has rheumatoid arthritis and is followed by Rheumatologists.   She has asthma and chronic allergies. States she has SOB when she goes outside because of the heat and any exposure to mold or allergens.    Review of Systems  Constitutional: Positive for malaise/fatigue.  Respiratory: Positive for cough (dry) and shortness of breath.   Cardiovascular: Positive for palpitations.  Musculoskeletal: Positive for back pain, joint pain and myalgias.  Neurological: Positive for weakness.  Psychiatric/Behavioral: Positive for depression.  All other systems reviewed and are negative.     Observations/Objective: No SOB or distress   Assessment and Plan: 1. Generalized weakness - Ambulatory referral to Chronic Care Management Services  2. Fibromyalgia - Ambulatory referral to Chronic Care Management Services  3. Malaise and fatigue - Ambulatory referral to Chronic Care Management Services  4. Osteoarthritis, unspecified osteoarthritis type, unspecified site - Ambulatory referral to Chronic Care Management Services  5. Raynaud's disease without gangrene - Ambulatory referral to Chronic Care Management Services  6. Sjogren's syndrome, with unspecified organ involvement ([place]) - Ambulatory referral to Chronic Care Management Services  7. Moderate persistent asthma without complication - montelukast (SINGULAIR) 10 MG tablet; Take 1 tablet (10 mg total) by mouth at bedtime.  Dispense: 90 tablet; Refill: 3  8. Conjunctival hemorrhage of left eye  9. Depression  Long discussion with patient. She talks in length how discouraged she is with her health and feels helpless. She is scared she is going to fall in her house and can only stand for a few minutes at a time. She states she has significant pain all through her body related to RA and Fibromyalgia. She states her sons have their own life and feels like she can not depend on them to take her to her doctor appointments or grocery shop for her.  We will do referral to Chronic care management to see if there are any resources to help her in her home. She does not wish to go to SNF at this time, but may need to if her health continues decrease.       I discussed the assessment  and treatment plan with the patient. The patient was provided an opportunity to ask questions and all were answered. The patient agreed with the plan and demonstrated an understanding of the instructions.   The patient was advised to call back or seek an in-person evaluation if the symptoms worsen or if the condition fails to improve as anticipated.   The above assessment and management plan was discussed with the patient. The patient verbalized understanding of and has agreed to the management plan. Patient is aware to call the clinic if symptoms persist or worsen. Patient is aware when to return to the clinic for a follow-up visit. Patient educated on when it is appropriate to go to the emergency department.   Time call ended: 2:55 pm   I provided 34 minutes of non-face-to-face time during this encounter.    [name], FNP

## 2018-06-26 ENCOUNTER — Ambulatory Visit: Payer: Medicare Other | Admitting: Family Medicine

## 2018-06-26 ENCOUNTER — Ambulatory Visit (INDEPENDENT_AMBULATORY_CARE_PROVIDER_SITE_OTHER): Payer: Medicare Other | Admitting: Licensed Clinical Social Worker

## 2018-06-26 DIAGNOSIS — M797 Fibromyalgia: Secondary | ICD-10-CM

## 2018-06-26 DIAGNOSIS — R5381 Other malaise: Secondary | ICD-10-CM

## 2018-06-26 DIAGNOSIS — M1990 Unspecified osteoarthritis, unspecified site: Secondary | ICD-10-CM

## 2018-06-26 DIAGNOSIS — M3500 Sicca syndrome, unspecified: Secondary | ICD-10-CM

## 2018-06-26 DIAGNOSIS — F321 Major depressive disorder, single episode, moderate: Secondary | ICD-10-CM | POA: Diagnosis not present

## 2018-06-26 DIAGNOSIS — I7300 Raynaud's syndrome without gangrene: Secondary | ICD-10-CM

## 2018-06-26 DIAGNOSIS — R531 Weakness: Secondary | ICD-10-CM

## 2018-06-26 NOTE — Patient Instructions (Signed)
Licensed Clinical Social Worker Visit Information  Goals we discussed today:  Goals Addressed            This Visit's Progress   . Client will talk with LCSW in next 30 days to discuss stress/anxiety related to managing her health needs (pt-stated)       Current Barriers:  . Pain issues faced . Social isolation . Mobility challenges . Medication costs  Clinical Social Work Clinical Goal(s):  [name], client will work with SW to address concerns related to stress/anxiety regarding client health conditions.  Interventions: . Provided counseling support for client . Provided information to client about CCM program services . Encouraged client to talk with [facility] regarding nursing needs of client . Talked with client about relaxation techniques or choice (enjoys reading to help her relax) . Talked with client about her social support network (children, neighbor, friends from church)  Patient Self Care Activities:  . Self administers medications as prescribed . Attends all scheduled provider appointments . Performs ADL's independently  Plan:  Attends client scheduled medical appointments Client to call RNCM to talk with RNCM about nursing needs of client LCSW to call client in next 3 weeks to talk with client about psychosocial needs of client Client to use relaxation techniques of choice to help her manage symptoms faced  Initial goal documentation      Materials Provided: No  Follow Up Plan: LCSW to call client in next 3 weeks to talk with client about psychosocial needs of client  The patient verbalized understanding of instructions provided today and declined a print copy of patient instruction materials.   [name].Izsak Meir MSW, LCSW Licensed Clinical Social Worker [facility] Care [facility] [phone]

## 2018-06-26 NOTE — Chronic Care Management (AMB) (Addendum)
  Chronic Care Management    Clinical Social Work CCM Outreach Note  06/26/2018 Name: Angela Wong MRN: [id number] DOB: 1940-05-25  Angela Wong is a 78 y.o. year old female who is a primary care patient of [name], FNP . The CCM team was consulted for assistance with assessment of psychosocial needs.   LCSW reached out to [name] today by phone to introduce and offer CCM services.   Ms. Angela Wong was given information about Chronic Care Management services today including:  1. CCM service includes personalized support from designated clinical staff supervised by her physician, including individualized plan of care and coordination with other care providers 2. 24/7 contact phone numbers for assistance for urgent and routine care needs. 3. Service will only be billed when office clinical staff spend 20 minutes or more in a month to coordinate care. 4. Only one practitioner may furnish and bill the service in a calendar month. 5. The patient may stop CCM services at any time (effective at the end of the month) by phone call to the office staff. 6. The patient will be responsible for cost sharing (co-pay) of up to 20% of the service fee (after annual deductible is met).  Patient agreed to services and verbal consent obtained.    Patient Stated Goal:  Client wants to talk with LCSW in next 30 days about managing stress/anxiety related to her ongoing health needs  Current Barriers:  . Pain issues faced . Social isolation . Mobility challenges . Medication costs  Clinical Social Work Clinical Goal(s):  [name], client will work with SW to address concerns related to stress/anxiety regarding client health conditions.  Interventions: . Provided counseling support for client . Provided information to client about CCM program services . Encouraged client to talk with [facility] regarding nursing needs of client . Talked with client about relaxation techniques or  choice (enjoys reading to help her relax) . Talked with client about her social support network (friend from church, neighbor, family members)  Patient Self Care Activities:  . Self administers medications as prescribed . Attends all scheduled provider appointments . Performs ADL's independently  Plan:  Attends client scheduled medical appointments Client to call RNCM to talk with RNCM about nursing needs of client LCSW to call client in next 3 weeks to talk with client about psychosocial needs of client Client to use relaxation techniques of choice to help her manage symptoms faced  Client spoke of multiple medical needs and spoke of ongoing pain issues. Client spoke of difficulty doing ADLs. Client spoke of medication costs issues.Client is hoping to get pain on more tolerable level and hoping to improve her sleep. She said that dealing with pain issues daily is difficult and she is not getting adequate sleep. She spoke of mobility challenges and difficulty with certain ADLs.   Follow Up Plan: LCSW to call client in next 3 weeks to talk with client about psychosocial needs of client  Angela Wong.Angela Wong MSW, LCSW Licensed Clinical Social Worker [facility] [phone]  I have reviewed and agree with the above  documentation.   Angela Dun, FNP

## 2018-07-01 ENCOUNTER — Other Ambulatory Visit: Payer: Self-pay | Admitting: Pharmacist Clinician (PhC)/ Clinical Pharmacy Specialist

## 2018-07-01 ENCOUNTER — Other Ambulatory Visit: Payer: Self-pay | Admitting: Family

## 2018-07-01 DIAGNOSIS — E039 Hypothyroidism, unspecified: Secondary | ICD-10-CM

## 2018-07-02 ENCOUNTER — Encounter: Payer: Self-pay | Admitting: Licensed Clinical Social Worker

## 2018-07-02 ENCOUNTER — Ambulatory Visit: Payer: Medicare Other | Admitting: Licensed Clinical Social Worker

## 2018-07-02 DIAGNOSIS — M3500 Sicca syndrome, unspecified: Secondary | ICD-10-CM

## 2018-07-02 DIAGNOSIS — F321 Major depressive disorder, single episode, moderate: Secondary | ICD-10-CM

## 2018-07-02 DIAGNOSIS — R5383 Other fatigue: Secondary | ICD-10-CM

## 2018-07-02 DIAGNOSIS — I7300 Raynaud's syndrome without gangrene: Secondary | ICD-10-CM

## 2018-07-02 DIAGNOSIS — M797 Fibromyalgia: Secondary | ICD-10-CM

## 2018-07-02 DIAGNOSIS — R5381 Other malaise: Secondary | ICD-10-CM

## 2018-07-02 DIAGNOSIS — M1990 Unspecified osteoarthritis, unspecified site: Secondary | ICD-10-CM

## 2018-07-02 DIAGNOSIS — R531 Weakness: Secondary | ICD-10-CM

## 2018-07-02 NOTE — Patient Instructions (Signed)
Licensed Clinical Social Worker Visit Information  Goals we discussed today:  Goals    . Client will talk with LCSW in next 30 days to discuss stress/anxiety related to managing her health needs (pt-stated)     Current Barriers:  . Pain issues faced . Social isolation . Mobility challenges . Medication costs  Clinical Social Work Clinical Goal(s):  [name], [name]  Interventions: . Provided counseling support for client . Encouraged client to talk with [facility] regarding nursing needs of client . Talked with clinet about relaxation techniques or choice (enjoys reading to help her relax) . Talked with client about her social support network   . Talked with client about [facility] transport support service  Patient Self Care Activities:  . Self administers medications as prescribed . Attends all scheduled provider appointments . Performs ADL's independently  Plan:  Attends client scheduled medical appointments Client to call RNCM to talk with RNCM about nursing needs of client LCSW to call client in next 3 weeks to talk with client about psychosocial needs of client Client to use relaxation techniques of choice to help her manage symptoms faced Client to call [facility] and talk with [facility] representative about transport support service with [facility]  Initial goal documentation    .           Materials Provided: No  Follow Up Plan: LCSW to call client in next 3 weeks to talk with client about psychosocial needs of client  The patient verbalized understanding of instructions provided today and declined a print copy of patient instruction materials.   [name].Dhaval Woo MSW, LCSW Licensed Clinical Social Worker [facility] [phone]

## 2018-07-02 NOTE — Chronic Care Management (AMB) (Addendum)
  Chronic Care Management    Clinical Social Work CCM Outreach Note  07/02/2018 Name: Angela Wong MRN: [id number] DOB: [birth year]  Angela Wong is a 78 y.o. year old female who is a primary care patient of [name], FNP . The CCM team was consulted for assistance with assessment of psychosocial needs.   LCSW reached out to [name] today by phone.  Social Determinants of Health: risk of Social Isolation; Financial Challenges.    Office Visit from 12/18/2017 in [place]  PHQ-9 Total Score  8     Goals    . Client will talk with LCSW in next 30 days to discuss stress/anxiety related to managing her health needs (pt-stated)     Current Barriers:  . Pain issues faced . Social isolation . Mobility challenges . Medication costs  Clinical Social Work Clinical Goal(s):  [name], [name]  Interventions: . Provided counseling support for client . Encouraged client to talk with [facility] regarding nursing needs of client . Talked with clinet about relaxation techniques or choice (enjoys reading to help her relax,enjoys watching birds, watching [place] on TV) . Talked with client about her social support network . Talked with client about [facility] transport support services  Patient Self Care Activities:  . Self administers medications as prescribed . Attends all scheduled provider appointments . Performs ADL's independently  Plan:  Client to attend client scheduled medical appointments Client to call RNCM to talk with RNCM about nursing needs of client LCSW to call client in next 3 weeks to talk with client about psychosocial needs of client Client to use relaxation techniques of choice to help her manage symptoms faced Client to contact [facility] to discuss transport support for client  Initial goal documentation    .            Client has difficulty sleeping because of pain issues. Client is eating adequately.Client uses walker to help her ambulate. Client has social support network with Angela Wong, friend from church and with neighbor of client.  LCSW gave client phone number for [facility] and talked with client about [facility] transport support  Follow Up Plan: LCSW to call client in next 3 weeks to talk with client about psychosocial needs of client  Angela Wong MSW, LCSW Licensed Clinical Social Worker [facility] Care [facility] [phone]  I have reviewed and agree with the above  documentation.   [name], FNP

## 2018-07-08 ENCOUNTER — Encounter: Payer: Self-pay | Admitting: Pharmacist Clinician (PhC)/ Clinical Pharmacy Specialist

## 2018-07-09 ENCOUNTER — Telehealth: Payer: Medicare Other

## 2018-07-13 ENCOUNTER — Telehealth: Payer: Medicare Other

## 2018-07-23 ENCOUNTER — Ambulatory Visit: Payer: Medicare Other | Admitting: Licensed Clinical Social Worker

## 2018-07-23 DIAGNOSIS — M797 Fibromyalgia: Secondary | ICD-10-CM

## 2018-07-23 DIAGNOSIS — M3500 Sicca syndrome, unspecified: Secondary | ICD-10-CM

## 2018-07-23 DIAGNOSIS — R5381 Other malaise: Secondary | ICD-10-CM

## 2018-07-23 DIAGNOSIS — I7300 Raynaud's syndrome without gangrene: Secondary | ICD-10-CM

## 2018-07-23 DIAGNOSIS — R531 Weakness: Secondary | ICD-10-CM

## 2018-07-23 DIAGNOSIS — M1990 Unspecified osteoarthritis, unspecified site: Secondary | ICD-10-CM

## 2018-07-23 DIAGNOSIS — F321 Major depressive disorder, single episode, moderate: Secondary | ICD-10-CM

## 2018-07-23 NOTE — Patient Instructions (Addendum)
Licensed Clinical Social Worker Visit Information  Goals we discussed today:   Goals    . Client will talk with LCSW in next 30 days to discuss stress/anxiety related to managing her health needs (pt-stated)     Current Barriers:  . Pain issues faced . Social isolation . Mobility challenges . Medication costs  Clinical Social Work Clinical Goal(s):  [name], [name]  Interventions: . Provided counseling support for client . Encouraged client to talk with [facility] regarding nursing needs of client . Talked with clinet about relaxation techniques or choice (enjoys reading to help her relax) . Talked with client about her social support network . Talked with client about transportation needs of client . Talked with client about ADTS in home support program services  Patient Self Care Activities:   . Self administers medications as prescribed . Attends all scheduled provider appointments . Performs ADL's independently  Plan:  Attends client scheduled medical appointments Client to call RNCM to talk with RNCM about nursing needs of client LCSW to call client in next 3 weeks to talk with client about psychosocial needs of client Client to use relaxation techniques of choice to help her manage symptoms faced  Initial goal documentation              Materials provided: No  Follow Up Plan:  LCSW to call client in next 3 weeks to talk with client about psychosocial  needs of client  The patient verbalized understanding of instructions provided today and declined a print copy of patient instruction materials.   [name].Angela Wong MSW, LCSW Licensed Clinical Social Worker [facility] Care [facility] [phone]

## 2018-07-23 NOTE — Chronic Care Management (AMB) (Addendum)
Care Management Note   Angela Wong is a 78 y.o. year old female who is a primary care patient of Angela Balloon, FNP. The CM team was consulted for assistance with chronic disease management and care coordination.   I reached out to Angela Wong by phone today.   Review of patient status, including review of consultants reports, relevant laboratory and other test results, and collaboration with appropriate care team members and the patient's provider was performed as part of comprehensive patient evaluation and provision of chronic care management services.   Social Determinants of Health:Financial resource strain; risk for social isolation    Office Visit from 12/18/2017 in Angela Wong  PHQ-9 Total Score  8     Goals    . Client will talk with LCSW in next 30 days to discuss stress/anxiety related to managing her health needs (pt-stated)     Current Barriers:  . Pain issues faced . Social isolation . Mobility challenges . Medication costs  Clinical Social Work Clinical Goal(s):  Angela Wong Kitchen Over the next 30  days, [name]  Interventions: . Provided counseling support for client . Encouraged client to talk with Angela Wong regarding nursing needs of client . Talked with clinet about relaxation techniques or choice (enjoys reading to help her relax) . Talked with client about her social support network . Talked with client about ADTS support through home health aide program . Talked with client about transportation needs  Patient Self Care Activities:  . Self administers medications as prescribed . Attends all scheduled provider appointments . Performs ADL's independently  Plan:  Attends client scheduled medical appointments Client to call RNCM to talk with RNCM about nursing needs of client LCSW to call client in next 3 weeks to talk with client about  psychosocial needs of client Client to use relaxation techniques of choice to help her manage symptoms faced  Initial goal documentation    Client spoke of her medication costs. She said some of her medications are fairly expensive. Client likes to read to relax. Client spoke of pain issues faced. She consulted with her Rheumatologist regarding client's current pain issues.  She has some challenges with ambulation. She uses a walker to help her ambulate. She is having difficulty sleeping. Client said her son has helped her occasionally with transport needs.She said she never knows when her hip or knee may give way.  She said she gets short of breath sometimes. She said she has support from her girlfriend Angela Wong. Client said Angela Wong calls client daily and helps client  with grocery shopping needs and medication needs Client said she is coughing occasionally. She said she has some challenges in standing.  She said she can stand only for a few minutes. Sometimes she stands and holds onto items to help balance her when standing. Client spoke of immune system issues. She spoke of her immune system and wondered if it would be safe for home health health aide to help her in the home. She said she is concerned about COVID 19 and her current immune system status. However, she does realize she could benefit from home health aide support  Follow Up Plan: LCSW to call client in next 3 weeks to talk with client about psychosocial needs of client  Angela Wong.Angela Wong MSW, LCSW Licensed Clinical Social Worker [facility] Care [facility] [phone]  I have reviewed and agree with the  above documentation.   Angela Dun, FNP

## 2018-07-31 ENCOUNTER — Ambulatory Visit (INDEPENDENT_AMBULATORY_CARE_PROVIDER_SITE_OTHER): Payer: Medicare Other | Admitting: Allergy & Immunology

## 2018-07-31 ENCOUNTER — Ambulatory Visit: Payer: Medicare Other | Admitting: Allergy & Immunology

## 2018-07-31 ENCOUNTER — Encounter: Payer: Self-pay | Admitting: Allergy & Immunology

## 2018-07-31 DIAGNOSIS — M3500 Sicca syndrome, unspecified: Secondary | ICD-10-CM | POA: Diagnosis not present

## 2018-07-31 DIAGNOSIS — L299 Pruritus, unspecified: Secondary | ICD-10-CM

## 2018-07-31 DIAGNOSIS — J4540 Moderate persistent asthma, uncomplicated: Secondary | ICD-10-CM | POA: Diagnosis not present

## 2018-07-31 DIAGNOSIS — J3089 Other allergic rhinitis: Secondary | ICD-10-CM

## 2018-07-31 DIAGNOSIS — J302 Other seasonal allergic rhinitis: Secondary | ICD-10-CM | POA: Diagnosis not present

## 2018-07-31 DIAGNOSIS — Z881 Allergy status to other antibiotic agents status: Secondary | ICD-10-CM | POA: Diagnosis not present

## 2018-07-31 MED ORDER — BUDESONIDE-FORMOTEROL FUMARATE 160-4.5 MCG/ACT IN AERO: 2.0000 | INHALATION_SPRAY | Freq: Two times a day (BID) | RESPIRATORY_TRACT | 5 refills | Status: DC

## 2018-07-31 MED ORDER — OLOPATADINE HCL 0.2 % OP SOLN: 1.0000 [drp] | Freq: Every day | OPHTHALMIC | 5 refills | Status: DC | PRN

## 2018-07-31 NOTE — Progress Notes (Signed)
RE: Angela Wong MRN: [id number] DOB: 05-07-1940 Date of Telemedicine Visit: 07/31/2018  Referring provider: [name], FNP Primary care provider: [name], FNP  Chief Complaint: Asthma (Televisit at home. Patient gave verbal consent to treat and bill insurance for this visit.)   Telemedicine Follow Up Visit via Telephone: I connected with Angela Wong for a follow up on 08/01/18 by telephone and verified that I am speaking with the correct person using two identifiers.   I discussed the limitations, risks, security and privacy concerns of performing an evaluation and management service by telephone and the availability of in person appointments. I also discussed with the patient that there may be a patient responsible charge related to this service. The patient expressed understanding and agreed to proceed.  Patient is at home.  Provider is at the office.  Visit start time: 3:04 PM Visit end time: 3:53 PM Insurance consent/check in by: Caryl Pina Medical consent and medical assistant/nurse: Caryl Pina  History of Present Illness:  She is a 78 y.o. female, who is being followed for rhinitis as well as persistent asthma and multiple medication allergies. Her previous allergy office visit was in January 2020 with myself. At that time, I recommended cetirizine 5 mg daily to help with the allergic rhinitis symptoms.  I also recommended using nasal saline gel.  For her asthma, her lung function looked good.  We continued with Singulair 10 mg daily and Symbicort 160/4.5 mcg 2 puffs twice daily with a spacer.  She was also itching, and I recommended the cetirizine as a means of helping with the itching as well.  Asthma/Respiratory Symptom History: She remains on the Symbicort two puffs twice daily. She is in the donut hole and is paying $100 per inhaler. She is having a hard time using it because of her arthritis. Her machine works fine but she needs new tubing for it. She then  complains that she needs to clean the tubing. She has never been on the Advair. She does feel that the Symbicort works well but it is hard for her compress the device. She did needs prednisone in February and she was placed on azithromycin (one of the few antibiotics that she can take). The following Monday, she was "rattling" and wheezing". She was then diagnosed with pneumonia and she was placed on clindamycin and a prednisone taper. She reports that she was having tightness in the jaw and pain in the chest. She went to the ED for an evaluation for MI. She was diagnosed with acute bronchitis that was "very bad" and she was not having a heart attack. She was given 50mg  more of prednisone and was placed on an albuterol inhaler. She was walking better at that time and she was able to move around better at that time.   Allergic Rhinitis Symptom History: She tells me that she can "smell the mold" whenever she goes outside and when she "waters a plant". She recently had HVAC units where she had "yucky air" recircualted for a couple of days. Then the HVAC died completely over 07-30-22.   She continues to have episodes of itching.  She now reports that she is having raised rashes as well.  These occur sporadically.  They last for a few hours before resolving.  She does take the 5 mg of cetirizine, but otherwise is unable to tolerate any other antihistamines due to their sedative side effects.  She has not had any throat swelling, stomach pain, passing out, or  other systemic symptoms with this rash.  He has never been placed on prednisone for the rash, although it further consideration she thinks it might she might of been on prednisone 1 or 2 times, but this was years ago.  She continues to have reactions to more triggers, including foods.  She does give me a couple of examples, but then starts talking about her reactions to medications.  Overall, her storytelling is rather difficult to follow. She tells me that she  has become "allergy to newsprint" and to "magazines". She is becoming more allergic to more things which is something new for her. Everything seems to be causing her irritation.  She remains on prednisone 5mg  daily for her arthritis. She will get intermittent bursts. She has carries a diagnosis of fibromyalgia.  Otherwise, there have been no changes to her past medical history, surgical history, family history, or social history.  Assessment and Plan:  Angela Wong is a 78 y.o. female with:  Moderate persistent asthma without complication  Seasonal and perennial allergic rhinitis(molds, ragweed, and cat)  Chronic urticaria - unknown trigger  Sjogren's syndrome  Multiple medication/environmental allergens   Asthma Reportables: Severity:moderate persistent Risk:high Control:not well controlled   Angela Wong presents for a follow up visit. She has a constellation of complaints, the most major of which seems to be her propensity to "react" to a multitude of triggers.  It is unclear whether these are IgE mediated reactions or not, as she seems to be somewhat of an anxious person and might be talking herself into these reactions.  Regardless, she is having what seems to be chronic urticaria.  She has had this itching for several months now, although the rash was not always there before.  The rash has become more of a prominent feature.  She is unable to tolerate high dose antihistamines due to sedative side effects.  Therefore we could get her approved for Xolair.  The Xolair would have an added benefit of acting as an immunomodulatory agent to help prevent her reacting to multiple other triggers. I will asked Tammy to reach out to her to discuss this further, however anticipate when she reads any of the side effects of Xolair she will refused to use it anyway.   Although she spent quite a bit of time complaining about the price of her Symbicort, every time that I offered suggestions to  decrease the cost including changing to nebulized medications, she refused.  She tells me that the Symbicort is working well to control her breathing so she would rather not change. We could try changing to nebulized medicines in the future including Perforomist and Pulmicort, which I believe can be charged through her part B her Medicare might decrease her out-of-pocket expense.  However, she wanted to stick with Symbicort.  We are going to [place] and [place] to see if this might help get her medications covered.  Plan/Recommendations:   1. Seasonal and perennial allergic rhinitis - Use the cetirizine 5mg  (half of a 10mg  tablet) daily to help with the allergic symptoms. - This might help prevent any reactions to the mold at your house. - This will help with the itching as well.  - Continue with nasal saline rinses as tolerated, but you can also try nasal saline gel (Ayr).  - Information on Xolair will be mailed. - Consider adding this on as an agent to help prevent/decrease the allergic reactions.  - I will have Tammy reach out to discuss this  with you.   2. Moderate persistent asthma, uncomplicated - Lung function deferred today since this was a telephone visit.  - We could consider adding on Xolair to help with your breathing as well as helping with the allergic reactions (hives).  - If needed in the future, can we can change to all nebulizer medications, although I do not know whether this is going to be cheaper or more expensive than the Symbicort.  - Daily controller medication(s): Singulair 10mg  daily and Symbicort 160/4.32mcg two puffs twice daily with spacer - Prior to physical activity: ProAir 2 puffs 10-15 minutes before physical activity. - Rescue medications: ProAir 4 puffs every 4-6 hours as needed - Asthma control goals:  * Full participation in all desired activities (may need albuterol before activity) * Albuterol use two time or less a week on average (not  counting use with activity) * Cough interfering with sleep two time or less a month * Oral steroids no more than once a year * No hospitalizations  3. Itching - I think the cetirizine will help to control the itching. - The addition of the Xolair could help with the itching.   4. Return in about 3 months (around 10/31/2018). This can be an in-person, a virtual Webex or a telephone follow up visit.   Diagnostics: None.  Medication List:  Current Outpatient Medications  Medication Sig Dispense Refill   albuterol (PROVENTIL HFA;VENTOLIN HFA) 108 (90 Base) MCG/ACT inhaler Inhale 2 puffs into the lungs every 6 (six) hours as needed for wheezing. 1 Inhaler 2   albuterol (PROVENTIL) (2.5 MG/3ML) 0.083% nebulizer solution Take 3 mLs (2.5 mg total) by nebulization every 6 (six) hours as needed for wheezing or shortness of breath. 75 mL 12   atenolol (TENORMIN) 25 MG tablet Take 12.5 mg by mouth daily. *May take additional 12.5mg  daily as needed for A-Fib  6   B Complex-C (B-COMPLEX WITH VITAMIN C) tablet Take 1 tablet by mouth every evening.      budesonide-formoterol (SYMBICORT) 160-4.5 MCG/ACT inhaler Inhale 2 puffs into the lungs 2 (two) times daily. 1 Inhaler 5   bumetanide (BUMEX) 0.5 MG tablet TAKE 1 TABLET BY MOUTH EVERY OTHER DAY 90 tablet 0   bumetanide (BUMEX) 1 MG tablet TAKE 1 TABLET BY MOUTH EVERY OTHER DAY ON OPPOSITE DAY OF 0.5MG  DOSE 90 tablet 0   Cholecalciferol (VITAMIN D) 2000 UNITS CAPS Take 2 capsules by mouth every evening.      docusate sodium (COLACE) 100 MG capsule Take 400 mg by mouth daily.      esomeprazole (NEXIUM) 40 MG capsule Take 1 capsule (40 mg total) by mouth 2 (two) times daily. 180 capsule 0   fexofenadine (ALLEGRA) 180 MG tablet Take 180 mg by mouth every morning.      levothyroxine (EUTHYROX) 50 MCG tablet Take 2 tablets (100 mcg total) by mouth every morning. 180 tablet 1   losartan (COZAAR) 100 MG tablet Take 1 tablet by mouth once daily 90  tablet 0   Magnesium 200 MG TABS Take 400 mg by mouth every evening.      metroNIDAZOLE (METROGEL) 1 % gel Apply topically daily. (Patient taking differently: Apply 1 application topically daily. Apply to face) 45 g 0   montelukast (SINGULAIR) 10 MG tablet Take 1 tablet (10 mg total) by mouth at bedtime. 90 tablet 3   Olopatadine HCl 0.2 % SOLN Apply 1 drop to eye daily as needed (for allergy eye). 2.5 mL 5   POLY-IRON 150  FORTE 150-25-1 MG-MCG-MG CAPS Take 1 capsule by mouth once daily 90 each 1   predniSONE (DELTASONE) 5 MG tablet Take 5 mg by mouth every morning.     PREVIDENT 5000 DRY MOUTH 1.1 % GEL dental gel Place 1 application onto teeth as needed (as needed for dry mouth).      RESTASIS 0.05 % ophthalmic emulsion INSTILL 1 DROP INTO EACH EYE TWICE DAILY 120 each 0   Simethicone (GAS-X EXTRA STRENGTH) 125 MG CAPS Take 2 capsules by mouth daily as needed (for relief).      traZODone (DESYREL) 150 MG tablet Take 1 tablet (150 mg total) by mouth at bedtime. 90 tablet 2   warfarin (COUMADIN) 1 MG tablet TAKE 2 TO 4 TABLETS BY MOUTH ONCE DAILY AS DIRECTED BY ANTICOAGULATION CLINIC 350 tablet 0   atenolol (TENORMIN) 25 MG tablet      No current facility-administered medications for this visit.    Allergies: Allergies  Allergen Reactions   Cephalosporins Hives and Shortness Of Breath   Ciprofloxacin Hives and Shortness Of Breath   Diltiazem Shortness Of Breath    Swollen throat    Doxycycline Hives and Shortness Of Breath   Horse-Derived Products Anaphylaxis   Ketek [Telithromycin] Palpitations    Chest discomfort   Nitrofuran Derivatives Anaphylaxis and Hives    blisters   Nitrous Oxide Nausea And Vomiting    Severe due to Sjogrens (Auto-Immune Disease)   Other Anaphylaxis    ALLERGY TO HORSE SERUM   Penicillins Hives and Shortness Of Breath    Did it involve swelling of the face/tongue/throat, SOB, or low BP? Yes Did it involve sudden or severe rash/hives,  skin peeling, or any reaction on the inside of your mouth or nose? No Did you need to seek medical attention at a hospital or doctor's office? Unknown When did it last happen?childhood If all above answers are NO, may proceed with cephalosporin use.    Pentazocine Other (See Comments)    Other reaction(s): Mental Status Changes (intolerance) Altered Mental Status  Altered Mental Status  Altered Mental Status    Sulfa Antibiotics Hives and Shortness Of Breath   Trovan [Alatrofloxacin] Palpitations, Other (See Comments) and Anaphylaxis    Chest pain, dizziness, irregular pulse   Amlodipine Swelling   Calcium Channel Blockers     Respiratory distress   Carvedilol Other (See Comments)    Dizziness, "joint pain, depression"   Clindamycin/Lincomycin     CP, lock jaw   Codeine Nausea Only   Cymbalta [Duloxetine Hcl] Swelling   Diovan [Valsartan] Swelling   Lisinopril Swelling   Sertraline Other (See Comments)    confusion   Tramadol Nausea Only   Loteprednol Etabonate Rash   I reviewed her past medical history, social history, family history, and environmental history and no significant changes have been reported from previous visits.  Review of Systems  Constitutional: Positive for activity change, diaphoresis and fatigue. Negative for appetite change, chills and fever.  HENT: Positive for congestion, ear pain, postnasal drip, rhinorrhea, sinus pressure and sneezing. Negative for ear discharge, sinus pain and sore throat.   Eyes: Positive for photophobia, pain and redness. Negative for discharge and itching.  Respiratory: Positive for shortness of breath. Negative for cough, wheezing and stridor.   Cardiovascular: Negative for palpitations.  Gastrointestinal: Negative for abdominal pain, constipation, diarrhea, nausea and vomiting.  Musculoskeletal: Positive for arthralgias, joint swelling and myalgias.  Skin: Negative for rash.  Allergic/Immunologic: Negative for  environmental allergies, food allergies and  immunocompromised state.  Neurological: Positive for headaches. Negative for seizures, light-headedness and numbness.    Objective:  Physical exam not obtained as encounter was done via telephone.   Previous notes and tests were reviewed.  I discussed the assessment and treatment plan with the patient. The patient was provided an opportunity to ask questions and all were answered. The patient agreed with the plan and demonstrated an understanding of the instructions.   The patient was advised to call back or seek an in-person evaluation if the symptoms worsen or if the condition fails to improve as anticipated.  I provided 49 minutes of non-face-to-face time during this encounter.  It was my pleasure to participate in Angela Wong's care today. Please feel free to contact me with any questions or concerns.   Sincerely,  Valentina Shaggy, MD

## 2018-07-31 NOTE — Patient Instructions (Addendum)
1. Seasonal and perennial allergic rhinitis - Use the cetirizine 5mg  (half of a 10mg  tablet) daily to help with the allergic symptoms. - This might help prevent any reactions to the mold at your house. - This will help with the itching as well.  - Continue with nasal saline rinses as tolerated, but you can also try nasal saline gel (Ayr).  - Information on Xolair will be mailed. - Consider adding this on as an agent to help prevent/decrease the allergic reactions.  - I will have Tammy reach out to discuss this with you.   2. Moderate persistent asthma, uncomplicated - Lung function deferred today since this was a telephone visit.  - We could consider adding on Xolair to help with your breathing as well as helping with the allergic reactions (hives).  - If needed in the future, can we can change to all nebulizer medications, although I do not know whether this is going to be cheaper or more expensive than the Symbicort.  - Daily controller medication(s): Singulair 10mg  daily and Symbicort 160/4.67mcg two puffs twice daily with spacer - Prior to physical activity: ProAir 2 puffs 10-15 minutes before physical activity. - Rescue medications: ProAir 4 puffs every 4-6 hours as needed - Asthma control goals:  * Full participation in all desired activities (may need albuterol before activity) * Albuterol use two time or less a week on average (not counting use with activity) * Cough interfering with sleep two time or less a month * Oral steroids no more than once a year * No hospitalizations  3. Itching - I think the cetirizine will help to control the itching. - The addition of the Xolair could help with the itching.   4. Return in about 3 months (around 10/31/2018). This can be an in-person, a virtual Webex or a telephone follow up visit.   Please inform us of any Emergency Department visits, hospitalizations, or changes in symptoms. Call us before going to the ED for breathing or allergy  symptoms since we might be able to fit you in for a sick visit. Feel free to contact us anytime with any questions, problems, or concerns.  It was a pleasure to talk to you today today!  Websites that have reliable patient information: 1. American Academy of Asthma, Allergy, and Immunology: www.aaaai.org 2. Food Allergy Research and Education (FARE): foodallergy.org 3. Mothers of Asthmatics: http://www.asthmacommunitynetwork.org 4. American College of Allergy, Asthma, and Immunology: www.acaai.org  "Like" [place] on Facebook and Instagram for our latest updates!      Make sure you are registered to vote! If you have moved or changed any of your contact information, you will need to get this updated before voting!  In some cases, you MAY be able to register to vote online: [email or web address]    Voter ID laws are NOT going into effect for the General Election in November 2020! DO NOT let this stop you from exercising your right to vote!   Absentee voting is the SAFEST way to vote during the coronavirus pandemic!   Download and print an absentee ballot request form at [email or web address] or you can scan the QR code below with your smart phone:      More information on absentee ballots can be found here: [email or web address]

## 2018-08-01 ENCOUNTER — Encounter: Payer: Self-pay | Admitting: Allergy & Immunology

## 2018-08-05 ENCOUNTER — Telehealth: Payer: Self-pay | Admitting: *Deleted

## 2018-08-05 NOTE — Telephone Encounter (Signed)
I did reach out to patient and she advised that she has multiple diseases and side effects form medications that she would like to think on it a little more.  I did describe how Xolair works and advised her that if she wants to think on further she can reach back out to me if she decides she wants to start therapy

## 2018-08-05 NOTE — Telephone Encounter (Signed)
-----   Message from Valentina Shaggy, MD sent at 08/01/2018  3:28 PM EDT ----- Can you call her about Xolair? Be sure to have an escape plan in place before you make the call. :-)

## 2018-08-07 ENCOUNTER — Telehealth: Payer: Self-pay | Admitting: Family

## 2018-08-07 ENCOUNTER — Other Ambulatory Visit: Payer: Self-pay | Admitting: Allergy and Immunology

## 2018-08-07 DIAGNOSIS — J3089 Other allergic rhinitis: Secondary | ICD-10-CM

## 2018-08-10 ENCOUNTER — Other Ambulatory Visit: Payer: Self-pay | Admitting: Physician Assistant

## 2018-08-10 DIAGNOSIS — J3089 Other allergic rhinitis: Secondary | ICD-10-CM

## 2018-08-10 MED ORDER — ALBUTEROL SULFATE HFA 108 (90 BASE) MCG/ACT IN AERS: INHALATION_SPRAY | RESPIRATORY_TRACT | 0 refills | Status: DC

## 2018-08-10 NOTE — Telephone Encounter (Signed)
Med sent.

## 2018-08-11 DIAGNOSIS — D1801 Hemangioma of skin and subcutaneous tissue: Secondary | ICD-10-CM | POA: Diagnosis not present

## 2018-08-11 DIAGNOSIS — L820 Inflamed seborrheic keratosis: Secondary | ICD-10-CM | POA: Diagnosis not present

## 2018-08-11 DIAGNOSIS — L218 Other seborrheic dermatitis: Secondary | ICD-10-CM | POA: Diagnosis not present

## 2018-08-11 DIAGNOSIS — L821 Other seborrheic keratosis: Secondary | ICD-10-CM | POA: Diagnosis not present

## 2018-08-11 DIAGNOSIS — D235 Other benign neoplasm of skin of trunk: Secondary | ICD-10-CM | POA: Diagnosis not present

## 2018-08-11 DIAGNOSIS — L570 Actinic keratosis: Secondary | ICD-10-CM | POA: Diagnosis not present

## 2018-08-11 DIAGNOSIS — Z85828 Personal history of other malignant neoplasm of skin: Secondary | ICD-10-CM | POA: Diagnosis not present

## 2018-08-11 DIAGNOSIS — D2372 Other benign neoplasm of skin of left lower limb, including hip: Secondary | ICD-10-CM | POA: Diagnosis not present

## 2018-08-13 ENCOUNTER — Ambulatory Visit: Payer: Medicare Other | Admitting: Licensed Clinical Social Worker

## 2018-08-13 DIAGNOSIS — R5381 Other malaise: Secondary | ICD-10-CM

## 2018-08-13 DIAGNOSIS — R531 Weakness: Secondary | ICD-10-CM

## 2018-08-13 DIAGNOSIS — I7300 Raynaud's syndrome without gangrene: Secondary | ICD-10-CM

## 2018-08-13 DIAGNOSIS — M1990 Unspecified osteoarthritis, unspecified site: Secondary | ICD-10-CM

## 2018-08-13 DIAGNOSIS — M797 Fibromyalgia: Secondary | ICD-10-CM

## 2018-08-13 DIAGNOSIS — F321 Major depressive disorder, single episode, moderate: Secondary | ICD-10-CM

## 2018-08-13 DIAGNOSIS — M3500 Sicca syndrome, unspecified: Secondary | ICD-10-CM

## 2018-08-13 DIAGNOSIS — R5383 Other fatigue: Secondary | ICD-10-CM

## 2018-08-13 NOTE — Chronic Care Management (AMB) (Addendum)
  Care Management Note   Angela Wong is a 78 y.o. year old female who is a primary care patient of [name], FNP. The CM team was consulted for assistance with chronic disease management and care coordination.   I reached out to Angela Wong by phone today.   Review of patient status, including review of consultants reports, relevant laboratory and other test results, and collaboration with appropriate care team members and the patient's provider was performed as part of comprehensive patient evaluation and provision of chronic care management services.   Social Determinants of Health: risk of social isolation; risk of financial strain    Office Visit from 12/18/2017 in [place]  PHQ-9 Total Score  8      Goals    . Client will talk with LCSW in next 30 days to discuss stress/anxiety related to managing her health needs (pt-stated)     Current Barriers:  . Pain issues faced . Social isolation . Mobility challenges . Medication costs  Clinical Social Work Clinical Goal(s):  [name], [name]  Interventions: . Provided counseling support for client . Encouraged client to talk with [facility] regarding nursing needs of client . Talked with client about relaxation techniques or choice (enjoys reading to help her relax) . Talked with client about her social support network (neighbor, friend from church)  Patient Self Care Activities:  . Self administers medications as prescribed . Attends all scheduled provider appointments . Performs ADL's independently  Plan:  Attends client scheduled medical appointments Client to call RNCM to talk with RNCM about nursing needs of client LCSW to call client in next 3 weeks to talk with client about psychosocial needs of client Client to use relaxation techniques of choice to help her manage  symptoms faced  Initial goal documentation     .   Client spoke of her allergies.  She said she went to dermatologist recently .  She likes to read to relax. She does word puzzles, reads religious materials to help her relax. Client has challenges in walking. She uses a walker to ambulate.  Client said her son helps client with client's transport needs. Client takes medications as prescribed.Client is concerned about her immune system and her safety related to COVID 19.  Client spoke of asthma, heart issues and auto immune disease. Client has support from her 3 sons. LCSW encouraged client to call RNCM as needed to discuss nursing needs of client  Follow Up Plan: LCSW to call client in next 3 weeks to talk with client about psychosocial needs of client  Angela Wong.Angela Wong MSW, LCSW Licensed Clinical Social Worker [facility] Care Management [phone]  I have reviewed and agree with the above  documentation.   Angela Dun, FNP

## 2018-08-13 NOTE — Patient Instructions (Signed)
Licensed Clinical Social Worker Visit Information  Goals we discussed today:  Goals    . Client will talk with LCSW in next 30 days to discuss stress/anxiety related to managing her health needs (pt-stated)     Current Barriers:  . Pain issues faced . Social isolation . Mobility challenges . Medication costs  Clinical Social Work Clinical Goal(s):  [name], [name]  Interventions: . Provided counseling support for client . Encouraged client to talk with [facility] regarding nursing needs of client . Talked with clinet about relaxation techniques or choice (enjoys reading to help her relax) . Talked with client about her social support network  Patient Self Care Activities:  . Self administers medications as prescribed . Attends all scheduled provider appointments . Performs ADL's independently  Plan:  Attends client scheduled medical appointments Client to call RNCM to talk with RNCM about nursing needs of client LCSW to call client in next 3 weeks to talk with client about psychosocial needs of client Client to use relaxation techniques of choice to help her manage symptoms faced  Initial goal documentation         Materials Provided: No   Follow Up Plan: LCSW to call client in next 3 weeks to talk with client about psychosocial needs of client  The patient verbalized understanding of instructions provided today and declined a print copy of patient instruction materials.   [name].Loye Reininger MSW, LCSW Licensed Clinical Social Worker [facility] Care [facility] [phone]

## 2018-08-17 DIAGNOSIS — I495 Sick sinus syndrome: Secondary | ICD-10-CM | POA: Diagnosis not present

## 2018-08-17 DIAGNOSIS — I472 Ventricular tachycardia: Secondary | ICD-10-CM | POA: Diagnosis not present

## 2018-08-17 DIAGNOSIS — I4821 Permanent atrial fibrillation: Secondary | ICD-10-CM | POA: Diagnosis not present

## 2018-08-17 DIAGNOSIS — R001 Bradycardia, unspecified: Secondary | ICD-10-CM | POA: Diagnosis not present

## 2018-08-17 DIAGNOSIS — Z950 Presence of cardiac pacemaker: Secondary | ICD-10-CM | POA: Diagnosis not present

## 2018-08-17 DIAGNOSIS — I10 Essential (primary) hypertension: Secondary | ICD-10-CM | POA: Diagnosis not present

## 2018-09-03 ENCOUNTER — Ambulatory Visit (INDEPENDENT_AMBULATORY_CARE_PROVIDER_SITE_OTHER): Payer: Medicare Other | Admitting: Licensed Clinical Social Worker

## 2018-09-03 DIAGNOSIS — R5381 Other malaise: Secondary | ICD-10-CM

## 2018-09-03 DIAGNOSIS — R531 Weakness: Secondary | ICD-10-CM

## 2018-09-03 DIAGNOSIS — M3500 Sicca syndrome, unspecified: Secondary | ICD-10-CM

## 2018-09-03 DIAGNOSIS — F321 Major depressive disorder, single episode, moderate: Secondary | ICD-10-CM | POA: Diagnosis not present

## 2018-09-03 DIAGNOSIS — R5383 Other fatigue: Secondary | ICD-10-CM

## 2018-09-03 DIAGNOSIS — M797 Fibromyalgia: Secondary | ICD-10-CM

## 2018-09-03 DIAGNOSIS — M1990 Unspecified osteoarthritis, unspecified site: Secondary | ICD-10-CM | POA: Diagnosis not present

## 2018-09-03 DIAGNOSIS — I7300 Raynaud's syndrome without gangrene: Secondary | ICD-10-CM

## 2018-09-03 NOTE — Chronic Care Management (AMB) (Addendum)
Care Management Note   Angela Wong is a 78 y.o. year old female who is a primary care patient of [name], FNP. The CM team was consulted for assistance with chronic disease management and care coordination.   I reached out to [name] by phone today.   Review of patient status, including review of consultants reports, relevant laboratory and other test results, and collaboration with appropriate care team members and the patient's provider was performed as part of comprehensive patient evaluation and provision of chronic care management services.   Social Determinants of Health: risk of social isolation; risk of financial strain    Office Visit from 12/18/2017 in [place]  PHQ-9 Total Score  8     Medications   New medications from outside sources are available for reconciliation   albuterol (PROVENTIL) (2.5 MG/3ML) 0.083% nebulizer solution    albuterol (VENTOLIN HFA) 108 (90 Base) MCG/ACT inhaler    atenolol (TENORMIN) 25 MG tablet    atenolol (TENORMIN) 25 MG tablet    B Complex-C (B-COMPLEX WITH VITAMIN C) tablet    budesonide-formoterol (SYMBICORT) 160-4.5 MCG/ACT inhaler    bumetanide (BUMEX) 0.5 MG tablet    bumetanide (BUMEX) 1 MG tablet    Cholecalciferol (VITAMIN D) 2000 UNITS CAPS    docusate sodium (COLACE) 100 MG capsule    esomeprazole (NEXIUM) 40 MG capsule    fexofenadine (ALLEGRA) 180 MG tablet    levothyroxine (EUTHYROX) 50 MCG tablet    losartan (COZAAR) 100 MG tablet    Magnesium 200 MG TABS    metroNIDAZOLE (METROGEL) 1 % gel    montelukast (SINGULAIR) 10 MG tablet    Olopatadine HCl 0.2 % SOLN    POLY-IRON 150 FORTE 150-25-1 MG-MCG-MG CAPS    predniSONE (DELTASONE) 5 MG tablet    PREVIDENT 5000 DRY MOUTH 1.1 % GEL dental gel    RESTASIS 0.05 % ophthalmic emulsion    Simethicone (GAS-X EXTRA STRENGTH) 125 MG CAPS    traZODone (DESYREL) 150 MG tablet    warfarin (COUMADIN) 1 MG tablet    Goals    . Client  will talk with LCSW in next 30 days to discuss stress/anxiety related to managing her health needs (pt-stated)     Current Barriers:  . Pain issues faced . Social isolation . Mobility challenges . Medication costs  Clinical Social Work Clinical Goal(s):  [name], [name]  Interventions:  . Encouraged client to talk with [facility] regarding nursing needs of client . Talked with clinet about relaxation techniques or choice (enjoys reading to help her relax) . Talked with client about her social support network . Talked with client about client mobility issues  Patient Self Care Activities:  . Self administers medications as prescribed . Attends all scheduled provider appointments . Performs ADL's independently  Plan:  Attends client scheduled medical appointments Client to call RNCM to talk with RNCM about nursing needs of client LCSW to call client in next 3 weeks to talk with client about psychosocial needs of client Client to use relaxation techniques of choice to help her manage symptoms faced  Initial goal documentation      .   Client spoke of her allergies.and of joint pain.Client has diagnosis of fibromyalgia.  She said she went to dermatologist a few weeks ago. .  She likes to read to relax. She does word  puzzles, reads religious materials to help her relax. She likes talking via phone with friends as a way to relax.  Client has challenges in walking. She uses a walker to ambulate. Client now has a walk in shower with a tub bench to use when bathing.  Client said her son helps client with client's transport needs. Client takes medications as prescribed. Client spoke of asthma, heart issues and auto immune disease. Client has support from her 3 sons. Client said she is occasionally short of breath. When walking with her walker to the mailbox and back, she may get  short of breath.   LCSW encouraged client to call RNCM as needed to discuss nursing needs of client . Client said she thinks she is overdue to have her blood checked. She said she may call next week for blood check appointment. if she can arrange a transport to [facility]. Client has friend from church, Angela Wong, who helps client with food procurement (groceries ). Angela Wong also helps pick up prescriptions for client as needed.   Follow Up Plan: LCSW to call client in next 3 weeks to talk with client about psychosocial needs of client  Angela Wong MSW, LCSW Licensed Clinical Social Worker [facility] Care Management [phone]  I have reviewed and agree with the above  documentation.   [name], FNP

## 2018-09-03 NOTE — Patient Instructions (Signed)
Licensed Clinical Social Worker Visit Information  Goals we discussed today:  Goals    . Client will talk with LCSW in next 30 days to discuss stress/anxiety related to managing her health needs (pt-stated)     Current Barriers:  . Pain issues faced . Social isolation . Mobility challenges . Medication costs  Clinical Social Work Clinical Goal(s):  [name], [name]  Interventions: . Talked with client about mobility issues for client . Encouraged client to talk with [facility] regarding nursing needs of client . Talked with clinet about relaxation techniques or choice (enjoys reading to help her relax) . Talked with client about her social support network  Patient Self Care Activities:  . Self administers medications as prescribed . Attends all scheduled provider appointments . Performs ADL's independently  Plan:  Attends client scheduled medical appointments Client to call RNCM to talk with RNCM about nursing needs of client LCSW to call client in next 3 weeks to talk with client about psychosocial needs of client Client to use relaxation techniques of choice to help her manage symptoms faced  Initial goal documentation            Materials Provided: No   Follow Up Plan: LCSW to call client in the  next 3 weeks to talk with client about the psychosocial needs of client at that time.   The patient verbalized understanding of instructions provided today and declined a print copy of patient instruction materials.   [name].Aerilyn Slee MSW, LCSW Licensed Clinical Social Worker [facility] Care Management [phone]

## 2018-09-17 ENCOUNTER — Other Ambulatory Visit: Payer: Self-pay | Admitting: Family

## 2018-09-17 ENCOUNTER — Other Ambulatory Visit: Payer: Self-pay | Admitting: Internal Medicine

## 2018-09-24 ENCOUNTER — Other Ambulatory Visit: Payer: Self-pay | Admitting: Internal Medicine

## 2018-09-25 ENCOUNTER — Ambulatory Visit (INDEPENDENT_AMBULATORY_CARE_PROVIDER_SITE_OTHER): Payer: Medicare Other | Admitting: Licensed Clinical Social Worker

## 2018-09-25 DIAGNOSIS — M797 Fibromyalgia: Secondary | ICD-10-CM

## 2018-09-25 DIAGNOSIS — M1990 Unspecified osteoarthritis, unspecified site: Secondary | ICD-10-CM

## 2018-09-25 DIAGNOSIS — R5381 Other malaise: Secondary | ICD-10-CM

## 2018-09-25 DIAGNOSIS — F321 Major depressive disorder, single episode, moderate: Secondary | ICD-10-CM | POA: Diagnosis not present

## 2018-09-25 DIAGNOSIS — I7300 Raynaud's syndrome without gangrene: Secondary | ICD-10-CM

## 2018-09-25 DIAGNOSIS — R531 Weakness: Secondary | ICD-10-CM

## 2018-09-25 DIAGNOSIS — M3500 Sicca syndrome, unspecified: Secondary | ICD-10-CM

## 2018-09-25 NOTE — Chronic Care Management (AMB) (Addendum)
Care Management Note   Angela Wong is a 78 y.o. year old female who is a primary care patient of [name], FNP. The CM team was consulted for assistance with chronic disease management and care coordination.   I reached out to [name] by phone today.   Review of patient status, including review of consultants reports, relevant laboratory and other test results, and collaboration with appropriate care team members and the patient's provider was performed as part of comprehensive patient evaluation and provision of chronic care management services.   Social Determinants of Health: risk of social isolation; risk of financial strain    Office Visit from 12/18/2017 in [place]  PHQ-9 Total Score  8      Medications   New medications from outside sources are available for reconciliation   albuterol (PROVENTIL) (2.5 MG/3ML) 0.083% nebulizer solution    albuterol (VENTOLIN HFA) 108 (90 Base) MCG/ACT inhaler    atenolol (TENORMIN) 25 MG tablet    atenolol (TENORMIN) 25 MG tablet    B Complex-C (B-COMPLEX WITH VITAMIN C) tablet    budesonide-formoterol (SYMBICORT) 160-4.5 MCG/ACT inhaler    bumetanide (BUMEX) 0.5 MG tablet    bumetanide (BUMEX) 1 MG tablet    Cholecalciferol (VITAMIN D) 2000 UNITS CAPS    docusate sodium (COLACE) 100 MG capsule    esomeprazole (NEXIUM) 40 MG capsule    fexofenadine (ALLEGRA) 180 MG tablet    levothyroxine (EUTHYROX) 50 MCG tablet    losartan (COZAAR) 100 MG tablet    Magnesium 200 MG TABS    metroNIDAZOLE (METROGEL) 1 % gel    montelukast (SINGULAIR) 10 MG tablet    Olopatadine HCl 0.2 % SOLN    POLY-IRON 150 FORTE 150-25-1 MG-MCG-MG CAPS    predniSONE (DELTASONE) 5 MG tablet    PREVIDENT 5000 DRY MOUTH 1.1 % GEL dental gel    RESTASIS 0.05 % ophthalmic emulsion    Simethicone (GAS-X EXTRA STRENGTH) 125 MG CAPS    traZODone (DESYREL) 150 MG tablet    warfarin (COUMADIN) 1 MG tablet    Goals    . Client  will talk with LCSW in next 30 days to discuss stress/anxiety related to managing her health needs (pt-stated)     Current Barriers:  . Pain issues faced . Social isolation . Mobility challenges . Medication costs  Clinical Social Work Clinical Goal(s):  [name], [name]  Interventions: . Talked with client about pain issues faced by client . Encouraged client to talk with [facility] regarding nursing needs of client . Talked with clinet about relaxation techniques or choice (enjoys reading to help her relax) . Talked with client about her social support network  Patient Self Care Activities:  . Self administers medications as prescribed . Attends all scheduled provider appointments . Performs ADL's independently  Plan:  Attends client scheduled medical appointments Client to call RNCM to talk with RNCM about nursing needs of client LCSW to call client in next 3 weeks to talk with client about psychosocial needs of client Client to use relaxation techniques of choice to help her manage symptoms faced  Initial goal documentation      Client spoke of her allergies.and of joint pain.Client has diagnosis of fibromyalgia.  She likes to read to relax. She does word puzzles, reads religious materials to help her relax. She likes talking via phone  with friends as a way to relax.  Client has challenges in walking. She uses a walker to ambulate. Client now has a walk in shower with a tub bench to use when bathing. Client said her son helps client with client's transport needs. Client takes medications as prescribed.Client has support from her 3 sons. Client said she is occasionally short of breath. When walking with her walker to the mailbox and back, she may get short of breath.   LCSW encouraged client to call RNCM as needed to discuss nursing needs of client . Client has friend  from church, [name], who helps client with food procurement (groceries ). [name] also helps pick up prescriptions for client as needed. Client said she sometimes has difficulty sleeping due to joint pain issues. LCSW and client spoke of her medical appointments.LCSW talked with client about CCM program support with LCSW and RNCM   Follow Up Plan: LCSW to call client in next 3 weeks to talk with client about the psychosocial needs of client  [name].Ithzel Fedorchak MSW, LCSW Licensed Clinical Social Worker [facility] Care Management [phone]  I have reviewed and agree with the above  documentation.   [name], FNP

## 2018-09-25 NOTE — Patient Instructions (Signed)
Licensed Clinical Social Worker Visit Information  Goals we discussed today:  Goals    . Client will talk with LCSW in next 30 days to discuss stress/anxiety related to managing her health needs (pt-stated)     Current Barriers:  . Pain issues faced . Social isolation . Mobility challenges . Medication costs  Clinical Social Work Clinical Goal(s):  [name], [name]  Interventions: . Talked with client about pain issues of client . Encouraged client to talk with [facility] regarding nursing needs of client . Talked with clinet about relaxation techniques or choice (enjoys reading to help her relax) . Talked with client about her social support network  Patient Self Care Activities:  . Self administers medications as prescribed . Attends all scheduled provider appointments . Performs ADL's independently  Plan:  Attends client scheduled medical appointments Client to call RNCM to talk with RNCM about nursing needs of client LCSW to call client in next 3 weeks to talk with client about psychosocial needs of client Client to use relaxation techniques of choice to help her manage symptoms faced  Initial goal documentation                  Materials Provided: No  Follow Up Plan: LCSW to call client in the next 3 weeks to talk with client about the psychosocial needs of client  The patient verbalized understanding of instructions provided today and declined a print copy of patient instruction materials.   [name].Jaymi Tinner MSW, LCSW Licensed Clinical Social Worker [facility] Care Management [phone]

## 2018-09-28 ENCOUNTER — Telehealth: Payer: Self-pay | Admitting: Internal Medicine

## 2018-09-28 MED ORDER — ESOMEPRAZOLE MAGNESIUM 40 MG PO CPDR: 40.0000 mg | DELAYED_RELEASE_CAPSULE | Freq: Two times a day (BID) | ORAL | 0 refills | Status: DC

## 2018-09-28 NOTE — Telephone Encounter (Signed)
Pt called stating that she tried to rf esomeprazole but her pharmacy said that it was rejected so pt would like to know the reason why.

## 2018-09-28 NOTE — Telephone Encounter (Signed)
Pt calling again about this previous message, Pls call her.

## 2018-09-28 NOTE — Telephone Encounter (Signed)
I have spoken to patient to advise that she needs follow up with Dr Hilarie Fredrickson as per last office visit note. She scheduled on 11/03/18 as a telephone visit as per her request as she feels she is too high risk to be seen in person. We will refill her medication until that appointment.

## 2018-09-29 ENCOUNTER — Ambulatory Visit: Payer: Medicare Other | Admitting: *Deleted

## 2018-09-29 DIAGNOSIS — J4540 Moderate persistent asthma, uncomplicated: Secondary | ICD-10-CM

## 2018-09-29 DIAGNOSIS — I4821 Permanent atrial fibrillation: Secondary | ICD-10-CM

## 2018-09-29 DIAGNOSIS — M3500 Sicca syndrome, unspecified: Secondary | ICD-10-CM

## 2018-10-02 ENCOUNTER — Telehealth: Payer: Medicare Other

## 2018-10-05 ENCOUNTER — Ambulatory Visit: Payer: Medicare Other | Admitting: *Deleted

## 2018-10-05 DIAGNOSIS — I4821 Permanent atrial fibrillation: Secondary | ICD-10-CM

## 2018-10-05 DIAGNOSIS — Z5181 Encounter for therapeutic drug level monitoring: Secondary | ICD-10-CM

## 2018-10-12 NOTE — Chronic Care Management (AMB) (Addendum)
  Chronic Care Management   Follow Up Note   10/01/2018 Name: Angela Wong MRN: [id number] DOB: Jun 02, 1940  Referred by: [name], FNP Reason for referral : Chronic Care Management (Incoming Call to RN CCM)   [name] is a 78 y.o. year old female who is a primary care patient of [name], FNP. The CCM team was consulted for assistance with chronic disease management and care coordination needs.    Review of patient status, including review of consultants reports, relevant laboratory and other test results, and collaboration with appropriate care team members and the patient's provider was performed as part of comprehensive patient evaluation and provision of chronic care management services.      Goals Addressed            This Visit's Progress     Patient Stated   . "I need to have my INR checked" (pt-stated)          Current Barriers:   Chronic Disease Management support and education needs related to anticoagulation therapy.  Nurse Case Manager Clinical Goal(s):   Over the next 30 days, patient will work with [facility] through [place] to address needs related to home INR testing.  Interventions:   Evaluation of current treatment plan related to anticoagulation therapy and patient's adherence to plan as established by provider.  Provided education to patient re: home INR testing through [place]. Patient should expect contact from [place] with information about Medicare coverage and to schedule an appointment for product demonstration and setup.   Collaborated with PCP office regarding order for INR testing  Discussed plans with patient for ongoing care management follow up and provided patient with direct contact information for care management team  Discussed patient's limited ability to come into the office and her reluctance to come in as well due to Covid.  Patient Self Care Activities:   Performs ADL's independently  Performs IADL's  independently  Initial goal documentation               . "I want to clarify a telephone call I received" (pt-stated)       Current Barriers:  Marland Kitchen Knowledge Deficits related to EMMI campaigns and who [facility] is  Nurse Case Manager Clinical Goal(s):  Marland Kitchen By the end of the current call, patient will verbalize understanding of EMMI and [facility].   Interventions:  . Listened to patient's concern regarding calls from unknown sources . Provided reassurance . Provided education to patient re: EMMI and [facility]) . Verified that she is scheduled for EMMI education regarding the yearly flu vaccine  Patient Self Care Activities:  . Performs ADL's independently . Performs IADL's independently  Initial goal documentation No additional follow-up anticipated        Follow Up Plan The care management team will reach out to the patient again over the next 14 days.    [name], BSN, RN-BC Embedded Chronic Care Manager [facility] [place] Management Direct Dial: [phone]    I have reviewed and agree with the above documentation.   [name], FNP

## 2018-10-12 NOTE — Patient Instructions (Signed)
INR order to be sent to [facility], [facility]   Follow up plan: The care management team will reach out to the patient again over the next 14 days.   [name], BSN, RN-BC Embedded Chronic Care Manager [facility] [place] Management Direct Dial: [phone]

## 2018-10-12 NOTE — Patient Instructions (Signed)
  Goals Addressed            This Visit's Progress     Patient Stated   . "I need to have my INR checked" (pt-stated)          Current Barriers:   Chronic Disease Management support and education needs related to anticoagulation therapy.  Nurse Case Manager Clinical Goal(s):   Over the next 30 days, patient will work with [facility] through [place] to address needs related to home INR testing.  Interventions:   Evaluation of current treatment plan related to anticoagulation therapy and patient's adherence to plan as established by provider.  Provided education to patient re: home INR testing through [place]. Patient should expect contact from [place] with information about Medicare coverage and to schedule an appointment for product demonstration and setup.   Collaborated with PCP office regarding order for INR testing  Discussed plans with patient for ongoing care management follow up and provided patient with direct contact information for care management team  Discussed patient's limited ability to come into the office and her reluctance to come in as well due to Covid.  Patient Self Care Activities:   Performs ADL's independently  Performs IADL's independently  Initial goal documentation               . "I want to clarify a telephone call I received" (pt-stated)       Current Barriers:  Marland Kitchen Knowledge Deficits related to EMMI campaigns and who [facility] is  Nurse Case Manager Clinical Goal(s):  Marland Kitchen By the end of the current call, patient will verbalize understanding of EMMI and [facility].   Interventions:  . Listened to patient's concern regarding calls from unknown sources . Provided reassurance . Provided education to patient re: EMMI and [facility]) . Verified that she is scheduled for EMMI education regarding the yearly flu vaccine  Patient Self Care Activities:  . Performs ADL's independently . Performs IADL's independently  Initial goal  documentation No additional follow-up anticipated        Follow Up Plan The care management team will reach out to the patient again over the next 14 days.    [name], BSN, RN-BC Embedded Chronic Care Manager [facility] [place] Management Direct Dial: [phone]

## 2018-10-12 NOTE — Progress Notes (Signed)
See previous note

## 2018-10-12 NOTE — Chronic Care Management (AMB) (Addendum)
  Chronic Care Management   Care Coordination Note  10/05/2018 Name: Angela Wong MRN: [id number] DOB: May 31, 1940  Care Coordination for home INR testing.   Goals Addressed            This Visit's Progress     Patient Stated   . "I need to have my INR checked" (pt-stated)       Current Barriers:  . Chronic Disease Management support and education needs related to anticoagulation therapy.  Nurse Case Manager Clinical Goal(s):  Marland Kitchen Over the next 30 days, patient will work with [facility] through [place] to address needs related to home INR testing.  Interventions:  . Collaboration with Jamelle Haring, LPN at [facility] office regarding order for home INR testing . Tye [place] verified that she would fax the completed order and relevant records to [place] . RN CCM will f/u over the next 14 days to verify home INR testing arrangement  Patient Self Care Activities:  . Performs ADL's independently . Performs IADL's independently  Please see past updates related to this goal by clicking on the "Past Updates" button in the selected goal          Follow up plan: The care management team will reach out to the patient again over the next 14 days.   [name], BSN, RN-BC Embedded Chronic Care Manager [facility] [place] Management Direct Dial: [phone]   I have reviewed and agree with the above  documentation.   [name], FNP

## 2018-10-13 ENCOUNTER — Ambulatory Visit: Payer: Medicare Other | Admitting: *Deleted

## 2018-10-13 DIAGNOSIS — I4821 Permanent atrial fibrillation: Secondary | ICD-10-CM

## 2018-10-14 NOTE — Patient Instructions (Signed)
RN CCM will call patient next week to follow-up on home INR testing order.   [name], BSN, RN-BC Embedded Chronic Care Manager [facility] [place] Management Direct Dial: [phone]

## 2018-10-14 NOTE — Chronic Care Management (AMB) (Addendum)
  Chronic Care Management   Care Coordination Note   10/13/2018 Name: Angela Wong MRN: [id number] DOB: May 07, 1940  Referred by: [name], FNP Reason for referral : Chronic Care Management (Care Coordination)  Spoke with PCP regarding order for home INR testing. They have not received anything back from [facility] yet.   RN CCM outreach to patient planned for next week.   [name], BSN, RN-BC Embedded Chronic Care Manager [facility] [place] Management Direct Dial: [phone]    I have reviewed and agree with the above  documentation.   [name], FNP

## 2018-10-19 ENCOUNTER — Ambulatory Visit: Payer: Medicare Other | Admitting: Licensed Clinical Social Worker

## 2018-10-19 DIAGNOSIS — M3500 Sicca syndrome, unspecified: Secondary | ICD-10-CM

## 2018-10-19 DIAGNOSIS — I4821 Permanent atrial fibrillation: Secondary | ICD-10-CM

## 2018-10-19 DIAGNOSIS — M1990 Unspecified osteoarthritis, unspecified site: Secondary | ICD-10-CM

## 2018-10-19 DIAGNOSIS — I7300 Raynaud's syndrome without gangrene: Secondary | ICD-10-CM

## 2018-10-19 DIAGNOSIS — F321 Major depressive disorder, single episode, moderate: Secondary | ICD-10-CM

## 2018-10-19 DIAGNOSIS — M797 Fibromyalgia: Secondary | ICD-10-CM

## 2018-10-19 NOTE — Chronic Care Management (AMB) (Addendum)
Care Management Note   Angela Wong is a 78 y.o. year old female who is a primary care patient of [name], FNP. The CM team was consulted for assistance with chronic disease management and care coordination.   I reached out to [name] by phone today.   Review of patient status, including review of consultants reports, relevant laboratory and other test results, and collaboration with appropriate care team members and the patient's provider was performed as part of comprehensive patient evaluation and provision of chronic care management services.   Social Determinants of Health: risk of social isolation; risk of financial strain    Office Visit from 12/18/2017 in [place]  PHQ-9 Total Score  8     Medications   New medications from outside sources are available for reconciliation   albuterol (PROVENTIL) (2.5 MG/3ML) 0.083% nebulizer solution    albuterol (VENTOLIN HFA) 108 (90 Base) MCG/ACT inhaler    atenolol (TENORMIN) 25 MG tablet    atenolol (TENORMIN) 25 MG tablet    B Complex-C (B-COMPLEX WITH VITAMIN C) tablet    budesonide-formoterol (SYMBICORT) 160-4.5 MCG/ACT inhaler    bumetanide (BUMEX) 0.5 MG tablet    bumetanide (BUMEX) 1 MG tablet    Cholecalciferol (VITAMIN D) 2000 UNITS CAPS    docusate sodium (COLACE) 100 MG capsule    esomeprazole (NEXIUM) 40 MG capsule    fexofenadine (ALLEGRA) 180 MG tablet    levothyroxine (EUTHYROX) 50 MCG tablet    losartan (COZAAR) 100 MG tablet    Magnesium 200 MG TABS    metroNIDAZOLE (METROGEL) 1 % gel    montelukast (SINGULAIR) 10 MG tablet    Olopatadine HCl 0.2 % SOLN    POLY-IRON 150 FORTE 150-25-1 MG-MCG-MG CAPS    predniSONE (DELTASONE) 5 MG tablet    PREVIDENT 5000 DRY MOUTH 1.1 % GEL dental gel    RESTASIS 0.05 % ophthalmic emulsion    Simethicone (GAS-X EXTRA STRENGTH) 125 MG CAPS    traZODone (DESYREL) 150 MG tablet    warfarin (COUMADIN) 1 MG tablet    Goals    . Client  will talk with LCSW in next 30 days to discuss stress/anxiety related to managing her health needs (pt-stated)     Current Barriers:  . Pain issues faced . Social isolation . Mobility challenges . Medication costs  Clinical Social Work Clinical Goal(s):  [name], [name]  Interventions: . Talked with client about upcoming client appointments (client has appointment with rheumatolgoist this Wednesday) . Encouraged client to talk with [facility] regarding nursing needs of client . Talked with clinet about relaxation techniques or choice (enjoys reading to help her relax, watching moves, sits outside on pretty weather days) . Talked with client about her social support network (son is supportive)  Patient Self Care Activities:  . Self administers medications as prescribed . Attends all scheduled provider appointments . Performs ADL's independently  Plan:  Attends client scheduled medical appointments Client to call RNCM to talk with RNCM about nursing needs of client LCSW to call client in next 3 weeks to talk with client about psychosocial needs of client Client to use relaxation techniques of choice to help her manage symptoms faced  Initial goal documentation       Client has appointment with GI doctor on 11/03/2018. Client has prescribed medications and is taking medications as prescribed.  Follow Up Plan: LCSW to call client in next 3 weeks to talk with client about the psychosocial needs of client  [name].Jaleel Allen MSW, LCSW Licensed Clinical Social Worker [facility] Care Management [phone]  I have reviewed and agree with the above  documentation.   [name], FNP

## 2018-10-19 NOTE — Patient Instructions (Signed)
Licensed Clinical Social Worker Visit Information  Goals we discussed today:  Goals        . Client will talk with LCSW in next 30 days to discuss stress/anxiety related to managing her health needs (pt-stated)     Current Barriers:  . Pain issues faced . Social isolation . Mobility challenges . Medication costs  Clinical Social Work Clinical Goal(s):  [name], [name]  Interventions:   . Encouraged client to talk with [facility] regarding nursing needs of client . Talked with clinet about relaxation techniques or choice (enjoys reading to help her relax)  .  Talked with client about her social support network (client's son is supportive)  . Talked with client about her upcoming appointments (she has appointment with rheumatologist       this Wednesday)  Patient Self Care Activities:  . Self administers medications as prescribed . Attends all scheduled provider appointments . Performs ADL's independently  Plan:  Attends client scheduled medical appointments Client to call RNCM to talk with RNCM about nursing needs of client LCSW to call client in next 3 weeks to talk with client about psychosocial needs of client Client to use relaxation techniques of choice to help her manage symptoms faced  Initial goal documentation         Materials Provided:  No  Follow Up Plan: LCSW to call client in next 3 weeks to to talk with client about the psychosocial needs of client  The patient verbalized understanding of instructions provided today and declined a print copy of patient instruction materials.   [name].Shavonne Ambroise MSW, LCSW Licensed Clinical Social Worker [facility] Care Management [phone]

## 2018-10-20 ENCOUNTER — Ambulatory Visit: Payer: Medicare Other | Admitting: *Deleted

## 2018-10-20 DIAGNOSIS — M3500 Sicca syndrome, unspecified: Secondary | ICD-10-CM

## 2018-10-20 DIAGNOSIS — I4821 Permanent atrial fibrillation: Secondary | ICD-10-CM

## 2018-10-20 NOTE — Patient Instructions (Signed)
  Goals Addressed            This Visit's Progress     Patient Stated   . "I need to have my INR checked" (pt-stated)       Current Barriers:  . Chronic Disease Management support and education needs related to anticoagulation therapy.  Nurse Case Manager Clinical Goal(s):  Marland Kitchen Over the next 30 days, patient will work with [facility] through [place] to address needs related to home INR testing.  Interventions:  Marland Kitchen Verified that order was faxed to [facility] on 10/14/18 and that a confirmation was received . Spoke with [facility] [phone] and they did not have the order on file. They requested that I fax it directly to the local office at [phone]. . Order and accompanying notes sent to [phone]. . Will f/u with them over the next 3 days to confirm receipt . Will update patient on status  Patient Self Care Activities:  . Performs ADL's independently . Performs IADL's independently  Please see past updates related to this goal by clicking on the "Past Updates" button in the selected goal       Follow Up Plan The care management team will reach out to the patient again over the next 3 days.   [name], BSN, RN-BC Embedded Chronic Care Manager [facility] / [place] Management Direct Dial: [phone]

## 2018-10-20 NOTE — Chronic Care Management (AMB) (Addendum)
  Chronic Care Management   Care Coordination Note  10/20/2018 Name: Angela Wong MRN: [id number] DOB: Jun 01, 1940  Referred by: [name], FNP Reason for referral : Chronic Care Management (Care Coordination)  Theadore Nan, LCSW spoke with patient today and she relayed that she has not received a call form [facility] to setup home INR testing.  Goals Addressed            This Visit's Progress     Patient Stated   . "I need to have my INR checked" (pt-stated)       Current Barriers:  . Chronic Disease Management support and education needs related to anticoagulation therapy.  Nurse Case Manager Clinical Goal(s):  Marland Kitchen Over the next 30 days, patient will work with [facility] through [place] to address needs related to home INR testing.  Interventions:  Marland Kitchen Verified that order was faxed to [facility] on 10/14/18 and that a confirmation was received . Spoke with [facility] [phone] and they did not have the order on file. They requested that I fax it directly to the local office at [phone]. . Order and accompanying notes sent to [phone]. . Will f/u with them over the next 3 days to confirm receipt . Will update patient on status  Patient Self Care Activities:  . Performs ADL's independently . Performs IADL's independently  Please see past updates related to this goal by clicking on the "Past Updates" button in the selected goal       Follow Up Plan The care management team will reach out to the patient again over the next 3 days.   [name], BSN, RN-BC Embedded Chronic Care Manager [facility] / [place] Management Direct Dial: [phone]    I have reviewed and agree with the above  documentation.   [name], FNP

## 2018-10-21 ENCOUNTER — Telehealth: Payer: Medicare Other | Admitting: *Deleted

## 2018-10-21 DIAGNOSIS — M2550 Pain in unspecified joint: Secondary | ICD-10-CM | POA: Diagnosis not present

## 2018-10-21 DIAGNOSIS — M1990 Unspecified osteoarthritis, unspecified site: Secondary | ICD-10-CM | POA: Diagnosis not present

## 2018-10-21 DIAGNOSIS — M1180 Other specified crystal arthropathies, unspecified site: Secondary | ICD-10-CM | POA: Diagnosis not present

## 2018-10-21 DIAGNOSIS — R5382 Chronic fatigue, unspecified: Secondary | ICD-10-CM | POA: Diagnosis not present

## 2018-10-21 DIAGNOSIS — M25562 Pain in left knee: Secondary | ICD-10-CM | POA: Diagnosis not present

## 2018-10-21 DIAGNOSIS — M3500 Sicca syndrome, unspecified: Secondary | ICD-10-CM | POA: Diagnosis not present

## 2018-10-21 DIAGNOSIS — M797 Fibromyalgia: Secondary | ICD-10-CM | POA: Diagnosis not present

## 2018-10-21 DIAGNOSIS — I7300 Raynaud's syndrome without gangrene: Secondary | ICD-10-CM | POA: Diagnosis not present

## 2018-10-21 DIAGNOSIS — M79643 Pain in unspecified hand: Secondary | ICD-10-CM | POA: Diagnosis not present

## 2018-10-23 ENCOUNTER — Other Ambulatory Visit: Payer: Self-pay | Admitting: Family

## 2018-10-23 ENCOUNTER — Encounter: Payer: Self-pay | Admitting: Family

## 2018-10-23 ENCOUNTER — Ambulatory Visit (INDEPENDENT_AMBULATORY_CARE_PROVIDER_SITE_OTHER): Payer: Medicare Other | Admitting: Family

## 2018-10-23 DIAGNOSIS — J329 Chronic sinusitis, unspecified: Secondary | ICD-10-CM | POA: Diagnosis not present

## 2018-10-23 MED ORDER — BUMETANIDE 0.5 MG PO TABS: 0.5000 mg | ORAL_TABLET | ORAL | 1 refills | Status: DC

## 2018-10-23 MED ORDER — AZITHROMYCIN 250 MG PO TABS: ORAL_TABLET | ORAL | 0 refills | Status: DC

## 2018-10-23 NOTE — Progress Notes (Signed)
   Virtual Visit via telephone Note Due to COVID-19 pandemic this visit was conducted virtually. This visit type was conducted due to national recommendations for restrictions regarding the COVID-19 Pandemic (e.g. social distancing, sheltering in place) in an effort to limit this patient's exposure and mitigate transmission in our community. All issues noted in this document were discussed and addressed.  A physical exam was not performed with this format.  I connected with Angela Wong on 10/23/18 at 9:15 AM by telephone and verified that I am speaking with the correct person using two identifiers. Angela Wong is currently located at home and no one is currently with her  during visit. The provider, [name], FNP is located in their office at time of visit.  I discussed the limitations, risks, security and privacy concerns of performing an evaluation and management service by telephone and the availability of in person appointments. I also discussed with the patient that there may be a patient responsible charge related to this service. The patient expressed understanding and agreed to proceed.   History and Present Illness:  Sinusitis This is a chronic problem. The current episode started more than 1 month ago. The problem has been waxing and waning since onset. There has been no fever. Her pain is at a severity of 3/10. The pain is mild. Associated symptoms include congestion, coughing, ear pain, headaches, a hoarse voice and sinus pressure. Past treatments include oral decongestants. The treatment provided mild relief.      Review of Systems  HENT: Positive for congestion, ear pain, hoarse voice and sinus pressure.   Respiratory: Positive for cough.   Cardiovascular: Positive for orthopnea.  Neurological: Positive for headaches.  All other systems reviewed and are negative.    Observations/Objective: No SOB or distress noted  Assessment and Plan: 1. Recurrent sinusitis  - Take meds as prescribed - Use a cool mist humidifier  -Use saline nose sprays frequently -Force fluids -For any cough or congestion  Use plain Mucinex- regular strength or max strength is fine -For fever or aces or pains- take tylenol or ibuprofen. -Throat lozenges if help -New toothbrush in 3 days -Keep INR appts Call if symptoms worsen or do not improve - bumetanide (BUMEX) 0.5 MG tablet; Take 1 tablet (0.5 mg total) by mouth every other day.  Dispense: 90 tablet; Refill: 1 - azithromycin (ZITHROMAX) 250 MG tablet; Take 500 mg once, then 250 mg for four days  Dispense: 6 tablet; Refill: 0     I discussed the assessment and treatment plan with the patient. The patient was provided an opportunity to ask questions and all were answered. The patient agreed with the plan and demonstrated an understanding of the instructions.   The patient was advised to call back or seek an in-person evaluation if the symptoms worsen or if the condition fails to improve as anticipated.  The above assessment and management plan was discussed with the patient. The patient verbalized understanding of and has agreed to the management plan. Patient is aware to call the clinic if symptoms persist or worsen. Patient is aware when to return to the clinic for a follow-up visit. Patient educated on when it is appropriate to go to the emergency department.   Time call ended:  9:25 AM  I provided 10 minutes of non-face-to-face time during this encounter.    [name], FNP

## 2018-10-26 ENCOUNTER — Ambulatory Visit: Payer: Medicare Other | Admitting: *Deleted

## 2018-10-26 ENCOUNTER — Telehealth: Payer: Self-pay

## 2018-10-26 DIAGNOSIS — I4821 Permanent atrial fibrillation: Secondary | ICD-10-CM

## 2018-10-26 NOTE — Telephone Encounter (Signed)
Spoke with Angela Wong from [place] and he stated that Perforomist and Budesonide were covered through the donut hole in her insurance and would save her close to $400 if this is something you wanted to prescribe for her.

## 2018-10-26 NOTE — Chronic Care Management (AMB) (Addendum)
Chronic Care Management   Follow Up Note   10/26/2018 Name: Angela Wong MRN: [id number] DOB: 09/18/1940  Referred by: [name], FNP Reason for referral : Chronic Care Management (RNCM Follow-up)   Angela Wong is a 78 y.o. year old female who is a primary care patient of [name], FNP. The CCM team was consulted for assistance with chronic disease management and care coordination needs.    Review of patient status, including review of consultants reports, relevant laboratory and other test results, and collaboration with appropriate care team members and the patient's provider was performed as part of comprehensive patient evaluation and provision of chronic care management services.    I spoke with Angela Wong by telephone regarding the order for home INR testing, anticoagulation therapy, and recent diagnosis of sinusitis. She continues to have nasal congestion, PND, fatigue, and an "upset stomach". Denies fever or cough that isn't due to PND.  Outpatient Encounter Medications as of 10/26/2018  Medication Sig Note   albuterol (PROVENTIL) (2.5 MG/3ML) 0.083% nebulizer solution Take 3 mLs (2.5 mg total) by nebulization every 6 (six) hours as needed for wheezing or shortness of breath.    albuterol (VENTOLIN HFA) 108 (90 Base) MCG/ACT inhaler INHALE 2 PUFFS BY MOUTH EVERY 6 HOURS AS NEEDED FOR WHEEZING    atenolol (TENORMIN) 25 MG tablet Take 12.5 mg by mouth daily. *May take additional 12.5mg  daily as needed for A-Fib    atenolol (TENORMIN) 25 MG tablet     azithromycin (ZITHROMAX) 250 MG tablet Take 500 mg once, then 250 mg for four days Potential for increased bleeding when taken with coumadin   B Complex-C (B-COMPLEX WITH VITAMIN C) tablet Take 1 tablet by mouth every evening.     budesonide-formoterol (SYMBICORT) 160-4.5 MCG/ACT inhaler Inhale 2 puffs into the lungs 2 (two) times daily.    bumetanide (BUMEX) 0.5 MG tablet Take 1 tablet (0.5 mg total) by  mouth every other day.    bumetanide (BUMEX) 1 MG tablet TAKE 1 TABLET BY MOUTH EVERY OTHER DAY ON OPPOSITE DAY OF 0.5MG  DOSE    Cholecalciferol (VITAMIN D) 2000 UNITS CAPS Take 2 capsules by mouth every evening.     docusate sodium (COLACE) 100 MG capsule Take 400 mg by mouth daily.  03/19/2018: On hold due to recent prescription of Clindamycin prescribed on 03/16/2018   esomeprazole (NEXIUM) 40 MG capsule Take 1 capsule (40 mg total) by mouth 2 (two) times daily.    fexofenadine (ALLEGRA) 180 MG tablet Take 180 mg by mouth every morning.     levothyroxine (EUTHYROX) 50 MCG tablet Take 2 tablets (100 mcg total) by mouth every morning.    losartan (COZAAR) 100 MG tablet Take 1 tablet by mouth once daily    Magnesium 200 MG TABS Take 400 mg by mouth every evening.     metroNIDAZOLE (METROGEL) 1 % gel Apply topically daily. (Patient taking differently: Apply 1 application topically daily. Apply to face)    montelukast (SINGULAIR) 10 MG tablet Take 1 tablet (10 mg total) by mouth at bedtime.    Olopatadine HCl 0.2 % SOLN Apply 1 drop to eye daily as needed (for allergy eye).    POLY-IRON 150 FORTE 150-25-1 MG-MCG-MG CAPS Take 1 capsule by mouth once daily    predniSONE (DELTASONE) 5 MG tablet Take 5 mg by mouth every morning. 03/19/2018: HOLD while taking Prednisone taper of 10mg  started on [id number]   PREVIDENT 5000 DRY MOUTH 1.1 % GEL dental gel  Place 1 application onto teeth as needed (as needed for dry mouth).     RESTASIS 0.05 % ophthalmic emulsion INSTILL 1 DROP INTO EACH EYE TWICE DAILY    Simethicone (GAS-X EXTRA STRENGTH) 125 MG CAPS Take 2 capsules by mouth daily as needed (for relief).     traZODone (DESYREL) 150 MG tablet Take 1 tablet (150 mg total) by mouth at bedtime.    warfarin (COUMADIN) 1 MG tablet TAKE 2 TO 4 TABLETS BY MOUTH ONCE DAILY AS DIRECTED BY ANTICOAGULATION CLINIC       Goals Addressed       "I need to have my INR checked" (pt-stated)       Current  Barriers:   Chronic Disease Management support and education needs related to anticoagulation therapy.  Nurse Case Manager Clinical Goal(s):   Over the next 30 days, patient will work with MD INR through [place] to address needs related to home INR testing.  Interventions:   Received fax confirmation from [facility] with patient and verified that she is scheduled for [facility] to come to her  home on 10/27/18 to demonstrate and setup mdINR.  Discussed that patient hasn't had her INR checked since May.  Reviewed side effects of coumadin. Patient denies any bruising or bleeding. No signs of blood clot.   Taking Zpak for 3 days without coumadin dose adjustment. It would be hard to adjust since we do not know her INR.  o Recommended eating some leafy greens for supper since the combination can increase the risk of bleeding o We will wait for INR report tomorrow and PCP can adjust medication from there  Patient Self Care Activities:   Performs ADL's independently  Performs IADL's independently  Please see past updates related to this goal by clicking on the "Past Updates" button in the selected goal         Follow Up Plan Home INR Testing:RN CCM will reach out over the next 5 days to follow up on home INR testing setup. Patient will work with Angela Wong tomorrow to setup home INR testing.  Anticoagulation Therapy due to Afib:Patient will call PCP [phone] with any signs of increased bruising or bleeding or will seek emergency medical care for bleeding that is uncontrolled.   Sinusitis:Patient will reach out to PCP if cold/sinusitis symptoms do not improve over the next 48 hours, if worsen, or if new symptoms develop.    [name], BSN, RN-BC Embedded Chronic Care Manager [facility] / [place] Management Direct Dial: [phone]   I have reviewed and agree with the above  documentation.    [name], FNP

## 2018-10-26 NOTE — Patient Instructions (Signed)
Visit Information  Goals Addressed            This Visit's Progress     Patient Stated   . "I need to have my INR checked" (pt-stated)       Current Barriers:  . Chronic Disease Management support and education needs related to anticoagulation therapy.  Nurse Case Manager Clinical Goal(s):  Marland Kitchen Over the next 30 days, patient will work with MD INR through [place] to address needs related to home INR testing.  Interventions:  . Received fax confirmation from [place] . Spoke with patient and verified that she is scheduled for [facility] to come to her  home on 10/27/18 to demonstrate and setup mdINR. Marland Kitchen Discussed that patient hasn't had her INR checked since May. . Reviewed side effects of coumadin. Patient denies any bruising or bleeding. No signs of blood clot.  . Taking Zpak for 3 days without coumadin dose adjustment. It would be hard to adjust since we do not know her INR.  o Recommended eating some leafy greens for supper since the combination can increase the risk of bleeding o We will wait for INR report tomorrow and PCP can adjust medication from there  Patient Self Care Activities:  . Performs ADL's independently . Performs IADL's independently  Please see past updates related to this goal by clicking on the "Past Updates" button in the selected goal         The patient verbalized understanding of instructions provided today and declined a print copy of patient instruction materials.   Follow Up Plan Home INR Testing:RN CCM will reach out over the next 5 days to follow up on home INR testing setup. Patient will work with [name] tomorrow to setup home INR testing.  Anticoagulation Therapy due to Afib:Patient will call PCP [phone] with any signs of increased bruising or bleeding or will seek emergency medical care for bleeding that is uncontrolled.   Sinusitis:Patient will reach out to PCP if cold/sinusitis symptoms do not improve over the next 48 hours, if worsen, or  if new symptoms develop.    [name], BSN, RN-BC Embedded Chronic Care Manager [facility] [place] Management Direct Dial: [phone]

## 2018-10-26 NOTE — Telephone Encounter (Signed)
Ashly @ [place] to see about doing an alternative for the patients Symbicort.   He has some other free options that he would like to discuss switching to.  Please Advise.  Ashly- [phone] Wong

## 2018-10-27 ENCOUNTER — Telehealth: Payer: Self-pay | Admitting: *Deleted

## 2018-10-27 ENCOUNTER — Other Ambulatory Visit: Payer: Self-pay | Admitting: Family

## 2018-10-27 DIAGNOSIS — Z7901 Long term (current) use of anticoagulants: Secondary | ICD-10-CM | POA: Diagnosis not present

## 2018-10-27 DIAGNOSIS — I4821 Permanent atrial fibrillation: Secondary | ICD-10-CM | POA: Diagnosis not present

## 2018-10-27 MED ORDER — BUMETANIDE 1 MG PO TABS: ORAL_TABLET | ORAL | 2 refills | Status: DC

## 2018-10-27 NOTE — Telephone Encounter (Signed)
Requesting refill of bumex 1mg . Bumex 0.5mg  needs to be refilled as well. She takes both, alternating days.

## 2018-10-27 NOTE — Telephone Encounter (Signed)
Fax received mdINR PT/INR self testing service Test date/time 10/27/18 4:02 pm INR 3.1

## 2018-10-27 NOTE — Telephone Encounter (Signed)
Meds were sent to [facility] and confirmed receipt. Patient notified and verbalized understanding

## 2018-10-27 NOTE — Telephone Encounter (Signed)
That is fine with me, as long as the patient is up to it.  Angela Marvel, MD Allergy and [facility]

## 2018-10-29 ENCOUNTER — Telehealth: Payer: Self-pay

## 2018-10-29 NOTE — Telephone Encounter (Signed)
Aware. 

## 2018-10-29 NOTE — Telephone Encounter (Signed)
  INR was 3.1 today (goal is 2.0 to 3.0) Sightly thin  Continue current schedule of warfarin taking it the following way:  2mg  Tuesdays, Thursdays, and Sundays and 4mg  all other days of the week.

## 2018-10-29 NOTE — Telephone Encounter (Signed)
Called patient and left message for her to call office in regards to this matter.

## 2018-10-29 NOTE — Telephone Encounter (Signed)
Pt called and stated she was recommended to be on a nebulizer by [facility], but because she is totally dependent on a walker and on all the other allergy medications she thinks being on a nebulizer isn't a good idea, she would like to stay with Symbicort. Is it possible to send a refill in for Symbicort?  [facility] in [place] [phone]

## 2018-10-30 MED ORDER — BUDESONIDE-FORMOTEROL FUMARATE 160-4.5 MCG/ACT IN AERO: 2.0000 | INHALATION_SPRAY | Freq: Two times a day (BID) | RESPIRATORY_TRACT | 5 refills | Status: DC

## 2018-10-30 NOTE — Telephone Encounter (Signed)
We can certainly send in a refill for Symbicort.  I am fine with that.  I think I was worried at the last visit about her technique, so please go over how to use the spacer and get adequate delivery of the medication.  Salvatore Marvel, MD Allergy and [facility]

## 2018-10-30 NOTE — Addendum Note (Signed)
Addended by: Lucrezia Starch I on: 10/30/2018 04:31 PM   Modules accepted: Orders

## 2018-10-30 NOTE — Telephone Encounter (Signed)
Refill sent a correct inhaler technique reviewed and repeated back.

## 2018-11-03 ENCOUNTER — Ambulatory Visit (INDEPENDENT_AMBULATORY_CARE_PROVIDER_SITE_OTHER): Payer: Medicare Other | Admitting: Internal Medicine

## 2018-11-03 ENCOUNTER — Encounter: Payer: Self-pay | Admitting: Internal Medicine

## 2018-11-03 ENCOUNTER — Other Ambulatory Visit: Payer: Self-pay

## 2018-11-03 VITALS — Ht 64.0 in | Wt 130.0 lb

## 2018-11-03 DIAGNOSIS — K30 Functional dyspepsia: Secondary | ICD-10-CM | POA: Diagnosis not present

## 2018-11-03 DIAGNOSIS — K582 Mixed irritable bowel syndrome: Secondary | ICD-10-CM | POA: Diagnosis not present

## 2018-11-03 DIAGNOSIS — K219 Gastro-esophageal reflux disease without esophagitis: Secondary | ICD-10-CM

## 2018-11-03 NOTE — Progress Notes (Addendum)
Subjective:    Patient ID: Angela Wong, female    DOB: 1940/12/03, 78 y.o.   MRN: [id number]  This service was provided via telemedicine.  Doximity app with AV communication The patient was located at home The provider was located in provider's GI office. The patient did consent to this telephone visit and is aware of possible charges through their insurance for this visit.   The persons participating in this telemedicine service were the patient and I. Time spent on call: 15 min   HPI Angela Wong is a 78 yo female with a history of GERD, IBS, remote adenomatous colon polyps, atrial fibrillation on warfarin, Sjogren's syndrome, COPD, remote ovarian cancer, hypoparathyroidism, fibromyalgia who is seen for follow-up.  She is seen virtually today in the setting of COVID-19.  She was last seen by virtual visit on 04/14/2018.    She reports she continues to have a great deal of stress related to the pandemic, her own medical conditions, having lost a pet recently, and one of her sons is having significant medical issues.  The stress makes her irritable bowel worse.  She is trying to deal with this without antidepressants because she has previously not tolerated them.  She reports she is trying to get sunlight, watching mass, praying, meditating and reading on a daily basis.  Reflux seems to be well controlled recently she is taking Nexium 40 mg a day.  Bowel movements have varied and she has been off of her Benefiber.  She wonders if she should resume this.  No blood in her stool or melena.  She is taking the Dulcolax stool softener which I believe is docusate.  Review of Systems As per HPI, otherwise negative  Current Medications, Allergies, Past Medical History, Past Surgical History, Family History and Social History were reviewed in [employer] record.     Objective:   Physical Exam No physical exam today, virtual visit  CBC    Component Value Date/Time   WBC 9.4 06/10/2018 1159   WBC 11.2 (H) 03/19/2018 1834   RBC 4.69 06/10/2018 1159   RBC 4.82 03/19/2018 1834   HGB 14.3 06/10/2018 1159   HCT 42.5 06/10/2018 1159   PLT 211 06/10/2018 1159   MCV 91 06/10/2018 1159   MCH 30.5 06/10/2018 1159   MCH 29.7 03/19/2018 1834   MCHC 33.6 06/10/2018 1159   MCHC 33.0 03/19/2018 1834   RDW 12.8 06/10/2018 1159   LYMPHSABS 1.1 06/10/2018 1159   MONOABS 0.7 12/24/2017 0224   EOSABS 0.1 06/10/2018 1159   BASOSABS 0.0 06/10/2018 1159   CMP     Component Value Date/Time   NA 137 03/19/2018 1834   NA 135 03/16/2018 1652   K 3.0 (L) 03/19/2018 1834   CL 100 03/19/2018 1834   CO2 27 03/19/2018 1834   GLUCOSE 143 (H) 03/19/2018 1834   BUN 22 03/19/2018 1834   BUN 11 03/16/2018 1652   CREATININE 0.82 03/19/2018 1834   CREATININE 0.74 01/24/2014 1135   CALCIUM 9.0 03/19/2018 1834   PROT 6.0 03/16/2018 1652   ALBUMIN 3.9 03/16/2018 1652   AST 21 03/16/2018 1652   ALT 16 03/16/2018 1652   ALKPHOS 73 03/16/2018 1652   BILITOT 0.6 03/16/2018 1652   GFRNONAA >60 03/19/2018 1834   GFRAA >60 03/19/2018 1834          Assessment & Plan:   78 yo female with a history of GERD, IBS, remote adenomatous colon polyps, atrial fibrillation on  warfarin, Sjogren's syndrome, COPD, remote ovarian cancer, hypoparathyroidism, fibromyalgia who is seen for follow-up.   1.  GERD/indigestion --she is doing better from a reflux perspective.  Earlier this year she had some steroid-induced gastritis but this has resolved. --Continue Nexium 40 mg daily  2.  IBS with alternating bowel habits, constipation predominant --doing fairly well with docusate at bedtime.  I have encouraged her to resume Benefiber a teaspoon working to 1 to 2 tablespoons daily.  She can follow-up as needed

## 2018-11-04 NOTE — Telephone Encounter (Signed)
Spoke with patient and this matter has been taken care of. She will stay on Symbicort for now. She has a televisit Friday 11/06/2018 and will speak further with Dr. Ernst Bowler.

## 2018-11-06 ENCOUNTER — Encounter: Payer: Self-pay | Admitting: Allergy & Immunology

## 2018-11-06 ENCOUNTER — Other Ambulatory Visit: Payer: Self-pay

## 2018-11-06 ENCOUNTER — Ambulatory Visit (INDEPENDENT_AMBULATORY_CARE_PROVIDER_SITE_OTHER): Payer: Medicare Other | Admitting: Allergy & Immunology

## 2018-11-06 DIAGNOSIS — J3089 Other allergic rhinitis: Secondary | ICD-10-CM | POA: Diagnosis not present

## 2018-11-06 DIAGNOSIS — J4540 Moderate persistent asthma, uncomplicated: Secondary | ICD-10-CM

## 2018-11-06 DIAGNOSIS — J0100 Acute maxillary sinusitis, unspecified: Secondary | ICD-10-CM | POA: Diagnosis not present

## 2018-11-06 DIAGNOSIS — M3500 Sicca syndrome, unspecified: Secondary | ICD-10-CM | POA: Diagnosis not present

## 2018-11-06 DIAGNOSIS — Z881 Allergy status to other antibiotic agents status: Secondary | ICD-10-CM | POA: Diagnosis not present

## 2018-11-06 DIAGNOSIS — J302 Other seasonal allergic rhinitis: Secondary | ICD-10-CM | POA: Diagnosis not present

## 2018-11-06 DIAGNOSIS — L299 Pruritus, unspecified: Secondary | ICD-10-CM

## 2018-11-06 MED ORDER — ZITHROMAX Z-PAK 250 MG PO TABS: ORAL_TABLET | ORAL | 0 refills | Status: DC

## 2018-11-06 NOTE — Patient Instructions (Addendum)
1. Seasonal and perennial allergic rhinitis -  - Use the cetirizine 5mg  (half of a 10mg  tablet) daily to help with the allergic symptoms. - This might help prevent any reactions to the mold at your house. - This will help with the itching as well.  - Continue with nasal saline rinses as tolerated, but you can also try nasal saline gel ([place]).  - Information on Xolair will be mailed. - Consider adding this on as an agent to help prevent/decrease the allergic reactions.  - I will have Tammy reach out to discuss this with you.   2. Moderate persistent asthma, uncomplicated - Lung function deferred today since this was a telephone visit.  - We could consider adding on Xolair to help with your breathing as well as helping with the allergic reactions (hives).  - If needed in the future, can we can change to all nebulizer medications, although I do not know whether this is going to be cheaper or more expensive than the Symbicort.  - Daily controller medication(s): Singulair 10mg  daily and Symbicort 160/4.26mcg two puffs twice daily with spacer - Prior to physical activity: ProAir 2 puffs 10-15 minutes before physical activity. - Rescue medications: ProAir 4 puffs every 4-6 hours as needed - Asthma control goals:  * Full participation in all desired activities (may need albuterol before activity) * Albuterol use two time or less a week on average (not counting use with activity) * Cough interfering with sleep two time or less a month * Oral steroids no more than once a year * No hospitalizations  3. Itching - I think the cetirizine will help to control the itching. - The addition of the Xolair could help with the itching.   4. No follow-ups on file. This can be an in-person, a virtual Webex or a telephone follow up visit.   Please inform us of any Emergency Department visits, hospitalizations, or changes in symptoms. Call us before going to the ED for breathing or allergy symptoms since we might  be able to fit you in for a sick visit. Feel free to contact us anytime with any questions, problems, or concerns.  It was a pleasure to talk to you today today!  Websites that have reliable patient information: 1. American Academy of Asthma, Allergy, and Immunology: www.aaaai.org 2. Food Allergy Research and Education (FARE): foodallergy.org 3. Mothers of Asthmatics: http://www.asthmacommunitynetwork.org 4. American College of Allergy, Asthma, and Immunology: www.acaai.org  "Like" [place] on Facebook and Instagram for our latest updates!      Make sure you are registered to vote! If you have moved or changed any of your contact information, you will need to get this updated before voting!  In some cases, you MAY be able to register to vote online: [email or web address]    Voter ID laws are NOT going into effect for the General Election in November 2020! DO NOT let this stop you from exercising your right to vote!   Absentee voting is the SAFEST way to vote during the coronavirus pandemic!   Download and print an absentee ballot request form at [email or web address] or you can scan the QR code below with your smart phone:      More information on absentee ballots can be found here: [email or web address]

## 2018-11-06 NOTE — Progress Notes (Signed)
RE: Angela Wong MRN: [id number] DOB: January 28, 1940 Date of Telemedicine Visit: 11/06/2018  Referring provider: [name], FNP Primary care provider: [name], FNP  Chief Complaint: Allergic Rhinitis  (Patient televisit at home. Patient gave verbal consent to treat and bill insurance for this visit.) and Asthma   Telemedicine Follow Up Visit via Telephone: I connected with Angela Wong for a follow up on 11/06/18 by telephone and verified that I am speaking with the correct person using two identifiers.   I discussed the limitations, risks, security and privacy concerns of performing an evaluation and management service by telephone and the availability of in person appointments. I also discussed with the patient that there may be a patient responsible charge related to this service. The patient expressed understanding and agreed to proceed.  Patient is at home.  Provider is at the office.  Visit start time: 10:39 AM Visit end time: 11:07 AM Insurance consent/check in by: Anderson Malta Medical consent and medical assistant/nurse: Caryl Pina  History of Present Illness:  She is a 78 y.o. female, who is being followed for her asthma as well as S/PAR and chronic urticaria. She also has a complicated past medical history, including Sjogren's syndrome as well as multiple medication and environmental allergens. Her previous allergy office visit was in July 2020 via a telephone with myself.  At that visit, she spent a lot of time complaining.  It seems that she is going to do the same thing this time.  She was especially concerned with the price of her medications.  I did attempt to make several medication changes to try to make it cheaper, but she refused that.  I also gave her a form to fill out so that she can get discounted drug from the company directly.  It is unclear whether she actually did this.  Regardless, for her rhinitis we continued with half of cetirizine tablet daily.  We  also recommended trying to figure out her mold exposures in her home.  We also recommended adding on Xolair due to her propensity for allergic reactions.  For her asthma, we continued Singulair 10 mg daily and Symbicort 162 puffs in the morning and 2 puffs at night.  She was itching as well, and we felt that the cetirizine would help with this.  Since last visit, she has mostly remained unchanged.  Her asthma has been under good control.  Today she reports feeling "weak and yucky".  She does have drainage nearly all of the time. After weeks with a headache and face aching and discolored mucous, she called her PCP and she was started on azithromycin. She did finish this without a problem. She does endorse some problems with her eye and she had broken vessels. This did not affect her vision too much. This happens on and off due to the Sjogren's syndrome. She does report that she has been weak with muscle and joint pain. She has not been sleeping well. She is not using nasal saline rinses at all, although she has considered doing this. She thinks that there is mold somewhere in her home, but she has not been able to find anything at all.   She is current using a walker all of the time due to Sjogren's and fibromyalgia. She has not need getting injections for her pain and she has been miserable for a "long time". She typically uses CBD creams that help.   She is having some nasal pain and discharge. She is having  some left sided pain. She also reports some visual changes consistent with migraines. She has been under a lot of stress. She has lost so many close friends.   She had an episode with dizziness and scratching of her throat and difficulty breathing. She remains slightly irritated. She tried airing out her entire house to see if this helps. She thought maybe that this could be asthma since she was having the respiratory complaints. She used her blood pressure machine and it was 138/84 with a pulse of  64.   She stopped all of her cetirizine. She was actually doing fairly well without the antihistamine on board. She is not itching like she was before. She remains on the Symbicort which is controlling her symptoms well without any problems. Angela Wong's asthma has been well controlled. She has not required rescue medication, experienced nocturnal awakenings due to lower respiratory symptoms, nor have activities of daily living been limited. She has required no Emergency Department or Urgent Care visits for her asthma. She has required zero courses of systemic steroids for asthma exacerbations since the last visit. ACT score today is 22, indicating excellent asthma symptom control.   She is taking something in the morning for reflux. She has been evaluated for sleep apnea and she has been fine. She was doing well with ranitidine at night, but this has since been discontinued. She tells me that famotidine made her "feel funny" and she has never been on cimetidine. She is not interested in adding anything else to her reflux.   Otherwise, there have been no changes to her past medical history, surgical history, family history, or social history.  Assessment and Plan:  Angela Wong is a 78 y.o. female with:  Moderate persistent asthma without complication  Seasonal and perennial allergic rhinitis(molds, ragweed, and cat)  Chronic sinusitis - s/p 1 round of azithromycin in early October  Chronic urticaria - unknown trigger  Sjogren's syndrome  Multiple medication/environmental allergens   Angela Wong presents for a virtual telephone visit to discuss quite a bit of complaints today.  She has finished her course of azithromycin as of 2 weeks ago for sinus pain and pressure, but she continues to have some sinus drainage.  She keeps downplaying it though, so it is difficult to ascertain how bad this really has.  She cannot do anymore antihistamines because they tend to over dry her, which does not work  well with her Sjogren's syndrome.  I did recommend using a humidifier in her room to try to keep the air more moist, which would also help keep her airways more moist.  She is open to doing this.  I did offer her a prednisone burst as well, but she does not like the way prednisone makes her feel.  She tells me her asthma is completely controlled and in fact she did try her albuterol without any improvement in her symptoms at all.  Finally I was able to convince her to tolerate another round of azithromycin in conjunction with adding the humidifier in her room.  I did warn her about adding too much immunity since this does encourage mold growth.  We will reconvene in 4 to 6 months to see how she is doing at that point.  I think some underlying depression is contributing to her symptoms, so I recommended maintaining social contacts and getting some time outdoors in the sun.   Diagnostics: None.  Medication List:  Current Outpatient Medications  Medication Sig Dispense Refill   albuterol (VENTOLIN  HFA) 108 (90 Base) MCG/ACT inhaler INHALE 2 PUFFS BY MOUTH EVERY 6 HOURS AS NEEDED FOR WHEEZING 9 g 0   atenolol (TENORMIN) 25 MG tablet Take 12.5 mg by mouth daily. *May take additional 12.5mg  daily as needed for A-Fib  6   B Complex-C (B-COMPLEX WITH VITAMIN C) tablet Take 1 tablet by mouth every evening.      budesonide-formoterol (SYMBICORT) 160-4.5 MCG/ACT inhaler Inhale 2 puffs into the lungs 2 (two) times daily. 1 Inhaler 5   bumetanide (BUMEX) 0.5 MG tablet Take 1 tablet (0.5 mg total) by mouth every other day. 90 tablet 1   bumetanide (BUMEX) 1 MG tablet TAKE 1 TABLET BY MOUTH EVERY OTHER DAY ON OPPOSITE DAY OF 0.5MG  DOSE 45 tablet 2   Cholecalciferol (VITAMIN D) 2000 UNITS CAPS Take 2 capsules by mouth every evening.      diphenhydrAMINE (BENADRYL) 25 MG tablet Take by mouth.     docusate sodium (COLACE) 100 MG capsule Take 400 mg by mouth daily.      esomeprazole (NEXIUM) 40 MG capsule  Take 1 capsule (40 mg total) by mouth 2 (two) times daily. (Patient taking differently: Take 40 mg by mouth daily. ) 180 capsule 0   fexofenadine (ALLEGRA) 180 MG tablet Take 180 mg by mouth every morning.      levothyroxine (EUTHYROX) 50 MCG tablet Take 2 tablets (100 mcg total) by mouth every morning. 180 tablet 1   losartan (COZAAR) 100 MG tablet Take 1 tablet by mouth once daily 90 tablet 0   Magnesium 200 MG TABS Take 400 mg by mouth every evening.      metroNIDAZOLE (METROGEL) 1 % gel Apply topically daily. (Patient taking differently: Apply 1 application topically daily. Apply to face) 45 g 0   montelukast (SINGULAIR) 10 MG tablet Take 1 tablet (10 mg total) by mouth at bedtime. 90 tablet 3   Olopatadine HCl 0.2 % SOLN Apply 1 drop to eye daily as needed (for allergy eye). 2.5 mL 5   POLY-IRON 150 FORTE 150-25-1 MG-MCG-MG CAPS Take 1 capsule by mouth once daily 90 each 1   predniSONE (DELTASONE) 5 MG tablet Take 5 mg by mouth every morning.     PREVIDENT 5000 DRY MOUTH 1.1 % GEL dental gel Place 1 application onto teeth as needed (as needed for dry mouth).      RESTASIS 0.05 % ophthalmic emulsion INSTILL 1 DROP INTO EACH EYE TWICE DAILY 120 each 0   Simethicone (GAS-X EXTRA STRENGTH) 125 MG CAPS Take 2 capsules by mouth daily as needed (for relief).      traZODone (DESYREL) 150 MG tablet Take 1 tablet (150 mg total) by mouth at bedtime. 90 tablet 2   warfarin (COUMADIN) 1 MG tablet TAKE 2 TO 4 TABLETS BY MOUTH ONCE DAILY AS DIRECTED BY ANTICOAGULATION CLINIC 350 tablet 0   No current facility-administered medications for this visit.    Allergies: Allergies  Allergen Reactions   Cephalosporins Hives and Shortness Of Breath   Ciprofloxacin Hives and Shortness Of Breath   Diltiazem Shortness Of Breath    Swollen throat    Doxycycline Hives and Shortness Of Breath   Horse-Derived Products Anaphylaxis   Ketek [Telithromycin] Palpitations    Chest discomfort    Nitrofuran Derivatives Anaphylaxis and Hives    blisters   Nitrous Oxide Nausea And Vomiting    Severe due to Sjogrens (Auto-Immune Disease)   Other Anaphylaxis    ALLERGY TO HORSE SERUM   Penicillins Hives and  Shortness Of Breath    Did it involve swelling of the face/tongue/throat, SOB, or low BP? Yes Did it involve sudden or severe rash/hives, skin peeling, or any reaction on the inside of your mouth or nose? No Did you need to seek medical attention at a hospital or doctor's office? Unknown When did it last happen?childhood If all above answers are NO, may proceed with cephalosporin use.    Pentazocine Other (See Comments)    Other reaction(s): Mental Status Changes (intolerance) Altered Mental Status  Altered Mental Status  Altered Mental Status    Sulfa Antibiotics Hives and Shortness Of Breath   Trovan [Alatrofloxacin] Palpitations, Other (See Comments) and Anaphylaxis    Chest pain, dizziness, irregular pulse   Amlodipine Swelling   Calcium Channel Blockers     Respiratory distress   Carvedilol Other (See Comments)    Dizziness, "joint pain, depression"   Clindamycin/Lincomycin     CP, lock jaw   Codeine Nausea Only   Cymbalta [Duloxetine Hcl] Swelling   Diovan [Valsartan] Swelling   Lisinopril Swelling   Sertraline Other (See Comments)    confusion   Tramadol Nausea Only   Loteprednol Etabonate Rash   I reviewed her past medical history, social history, family history, and environmental history and no significant changes have been reported from previous visits.  Review of Systems  Constitutional: Positive for fatigue. Negative for activity change, appetite change, chills, diaphoresis and fever.  HENT: Positive for nosebleeds, postnasal drip, rhinorrhea, sinus pressure, sinus pain and sneezing. Negative for congestion, ear discharge, ear pain and sore throat.   Eyes: Positive for pain and redness. Negative for discharge and itching.  Respiratory:  Negative for shortness of breath, wheezing and stridor.   Gastrointestinal: Negative for diarrhea, nausea and vomiting.  Endocrine: Negative for cold intolerance and heat intolerance.  Musculoskeletal: Negative for arthralgias, joint swelling and myalgias.  Skin: Negative for rash.  Allergic/Immunologic: Positive for environmental allergies. Negative for food allergies.  Neurological: Positive for dizziness and light-headedness.    Objective:  Physical exam not obtained as encounter was done via telephone.   Previous notes and tests were reviewed.  I discussed the assessment and treatment plan with the patient. The patient was provided an opportunity to ask questions and all were answered. The patient agreed with the plan and demonstrated an understanding of the instructions.   The patient was advised to call back or seek an in-person evaluation if the symptoms worsen or if the condition fails to improve as anticipated.  I provided 28 minutes of non-face-to-face time during this encounter.  It was my pleasure to participate in [place] Riano's care today. Please feel free to contact me with any questions or concerns.   Sincerely,  Valentina Shaggy, MD

## 2018-11-10 ENCOUNTER — Telehealth: Payer: Self-pay | Admitting: *Deleted

## 2018-11-10 ENCOUNTER — Ambulatory Visit (INDEPENDENT_AMBULATORY_CARE_PROVIDER_SITE_OTHER): Payer: Medicare Other | Admitting: Licensed Clinical Social Worker

## 2018-11-10 DIAGNOSIS — I4821 Permanent atrial fibrillation: Secondary | ICD-10-CM | POA: Diagnosis not present

## 2018-11-10 DIAGNOSIS — I7300 Raynaud's syndrome without gangrene: Secondary | ICD-10-CM

## 2018-11-10 DIAGNOSIS — M3500 Sicca syndrome, unspecified: Secondary | ICD-10-CM

## 2018-11-10 DIAGNOSIS — M797 Fibromyalgia: Secondary | ICD-10-CM

## 2018-11-10 DIAGNOSIS — R5383 Other fatigue: Secondary | ICD-10-CM

## 2018-11-10 DIAGNOSIS — F321 Major depressive disorder, single episode, moderate: Secondary | ICD-10-CM | POA: Diagnosis not present

## 2018-11-10 DIAGNOSIS — M1990 Unspecified osteoarthritis, unspecified site: Secondary | ICD-10-CM

## 2018-11-10 DIAGNOSIS — R531 Weakness: Secondary | ICD-10-CM

## 2018-11-10 DIAGNOSIS — R5381 Other malaise: Secondary | ICD-10-CM

## 2018-11-10 NOTE — Telephone Encounter (Signed)
Fax received [facility] PT/INR self testing service Test date/time 11/10/18 9:59 am INR 3.5

## 2018-11-10 NOTE — Telephone Encounter (Signed)
  INR was 3.5 today (goal is 2.0 to 3.0) still too thin. Hold today. Continue current schedule of warfarin taking it the following way:  4 mg MON FRI, 2 mg all other days. Recheck 1 week  Terald Sleeper PA-C [facility] [address]  [place], [place] [address] [phone]

## 2018-11-10 NOTE — Chronic Care Management (AMB) (Addendum)
Care Management Note   Angela Wong is a 78 y.o. year old female who is a primary care patient of Angela Balloon, FNP. The CM team was consulted for assistance with chronic disease management and care coordination.    I reached out to Angela Wong by phone today.   Review of patient status, including review of consultants reports, relevant laboratory and other test results, and collaboration with appropriate care team members and the patient's provider was performed as part of comprehensive patient evaluation and provision of chronic care management services.   Social determinants of health: risk of social isolation; risk of financial strain    Office Visit from 12/18/2017 in [name]  PHQ-9 Total Score  8     Medications    albuterol (VENTOLIN HFA) 108 (90 Base) MCG/ACT inhaler    atenolol (TENORMIN) 25 MG tablet    B Complex-C (B-COMPLEX WITH VITAMIN C) tablet    budesonide-formoterol (SYMBICORT) 160-4.5 MCG/ACT inhaler    bumetanide (BUMEX) 0.5 MG tablet    bumetanide (BUMEX) 1 MG tablet    Cholecalciferol (VITAMIN D) 2000 UNITS CAPS    diphenhydrAMINE (BENADRYL) 25 MG tablet    docusate sodium (COLACE) 100 MG capsule    esomeprazole (NEXIUM) 40 MG capsule    fexofenadine (ALLEGRA) 180 MG tablet    levothyroxine (EUTHYROX) 50 MCG tablet    losartan (COZAAR) 100 MG tablet    Magnesium 200 MG TABS    metroNIDAZOLE (METROGEL) 1 % gel    montelukast (SINGULAIR) 10 MG tablet    Olopatadine HCl 0.2 % SOLN    POLY-IRON 150 FORTE 150-25-1 MG-MCG-MG CAPS    predniSONE (DELTASONE) 5 MG tablet    PREVIDENT 5000 DRY MOUTH 1.1 % GEL dental gel    RESTASIS 0.05 % ophthalmic emulsion    Simethicone (GAS-X EXTRA STRENGTH) 125 MG CAPS    traZODone (DESYREL) 150 MG tablet    warfarin (COUMADIN) 1 MG tablet    ZITHROMAX Z-PAK 250 MG tablet    Goals        . Client will talk with LCSW in next 30 days to discuss stress/anxiety related to managing her  health needs (pt-stated)     Current Barriers:  . Pain issues faced . Social isolation . Mobility challenges . Medication costs  Clinical Social Work Clinical Goal(s):  [name], [name]  Interventions: . Encouraged client to talk with Angela Wong regarding nursing needs of client . Talked with clinet about relaxation techniques or choice (enjoys reading to help her relax) . Talked with client about her social support network . Talked with client about client recent appointments . Talked with client about pain issues of client . Talked with client about in home INR checks (she said she has been doing in home INR checks for about  3 weeks) . Collaborated with RNCM regarding nursing needs of client  Patient Self Care Activities:  . Self administers medications as prescribed . Attends all scheduled provider appointments . Performs ADL's independently  Plan:  Attends client scheduled medical appointments Client to call RNCM to talk with RNCM about nursing needs of client LCSW to call client in next 3 weeks to talk with client about psychosocial needs of client Client to use relaxation techniques of choice to help her manage symptoms faced  Initial goal documentation       Follow Up  Plan: LCSW to call client in next 3 weeks to talk with client about the psychosocial needs of client  Angela Wong MSW, LCSW Licensed Clinical Social Worker [facility] Care Management [phone]  I have reviewed and agree with the above documentation.   Angela Dun, FNP

## 2018-11-10 NOTE — Telephone Encounter (Signed)
Patient aware and verbalized understanding. °

## 2018-11-10 NOTE — Patient Instructions (Addendum)
Licensed Clinical Social Worker Visit Information  Goals we discussed today:  Goals        . Client will talk with LCSW in next 30 days to discuss stress/anxiety related to managing her health needs (pt-stated)     Current Barriers:  . Pain issues faced . Social isolation . Mobility challenges . Medication costs  Clinical Social Work Clinical Goal(s):  [name], [name]  Interventions: . Encouraged client to talk with [facility] regarding nursing needs of client . Talked with clinet about relaxation techniques or choice (enjoys reading to help her relax) . Talked with client about her social support network   Talked with client about client recent appointments  Talked with client about pain issues of client  Talked with client about in home INR checks (she said she has been doing in home INR checks for about  3 weeks) . Collaborated with RNCM regarding nursing needs of client  Patient Self Care Activities:  . Self administers medications as prescribed . Attends all scheduled provider appointments . Performs ADL's independently  Plan:  Attends client scheduled medical appointments Client to call RNCM to talk with RNCM about nursing needs of client LCSW to call client in next 3 weeks to talk with client about psychosocial needs of client Client to use relaxation techniques of choice to help her manage symptoms faced  Initial goal documentation              Materials Provided: No  Follow Up Plan: LCSW to call client in next 3 weeks to talk with client about the psychosocial needs of client  Client verbalized understanding of patient instructions today and declined a print copy of  patient instructions today  [name].Angela Wong MSW, LCSW Licensed Clinical Social Worker [facility] [phone]

## 2018-11-18 ENCOUNTER — Other Ambulatory Visit: Payer: Self-pay | Admitting: Family

## 2018-11-18 DIAGNOSIS — I4821 Permanent atrial fibrillation: Secondary | ICD-10-CM | POA: Diagnosis not present

## 2018-11-18 DIAGNOSIS — Z7901 Long term (current) use of anticoagulants: Secondary | ICD-10-CM | POA: Diagnosis not present

## 2018-11-19 ENCOUNTER — Telehealth: Payer: Self-pay | Admitting: Internal Medicine

## 2018-11-19 NOTE — Telephone Encounter (Signed)
Pt read her OV note from 11/03/18 with Dr. Hilarie Fredrickson. Pt is quite concerned. States in the note Dr. Hilarie Fredrickson states she has a history of remote breast cancer. Pt reports she has never been told she has breast cancer. And she is quite concerned. Please advise.

## 2018-11-20 ENCOUNTER — Telehealth: Payer: Self-pay | Admitting: Internal Medicine

## 2018-11-20 NOTE — Telephone Encounter (Signed)
Pt calling back again regarding her OV note dictation that she saw on mychart. Very concerned about the mention of breast cancer, states she has never been told she has breast cancer. Discussed with pt this was most likely an error in the dictation. Pt would like clarification today. See additional phone note and please advise.

## 2018-11-20 NOTE — Telephone Encounter (Signed)
Spoke with pt and she is aware, she thanked me to the call. Pt knows the OV note will be corrected.

## 2018-11-20 NOTE — Telephone Encounter (Signed)
This was a mistake, which I will correct.  That should read ovarian cancer history. I am glad she caught this mistake and I am sorry for any anxiety it caused JMP

## 2018-11-20 NOTE — Telephone Encounter (Signed)
See other telephone note from today 

## 2018-11-21 DIAGNOSIS — Z45018 Encounter for adjustment and management of other part of cardiac pacemaker: Secondary | ICD-10-CM | POA: Diagnosis not present

## 2018-11-21 DIAGNOSIS — Z45010 Encounter for checking and testing of cardiac pacemaker pulse generator [battery]: Secondary | ICD-10-CM | POA: Diagnosis not present

## 2018-11-23 ENCOUNTER — Ambulatory Visit: Payer: Medicare Other

## 2018-11-23 ENCOUNTER — Ambulatory Visit (INDEPENDENT_AMBULATORY_CARE_PROVIDER_SITE_OTHER): Payer: Medicare Other | Admitting: Family

## 2018-11-23 ENCOUNTER — Other Ambulatory Visit: Payer: Self-pay

## 2018-11-23 ENCOUNTER — Encounter: Payer: Self-pay | Admitting: Family

## 2018-11-23 VITALS — BP 150/76 | HR 73 | Temp 97.7°F | Ht 64.0 in | Wt 133.2 lb

## 2018-11-23 DIAGNOSIS — R531 Weakness: Secondary | ICD-10-CM

## 2018-11-23 DIAGNOSIS — I10 Essential (primary) hypertension: Secondary | ICD-10-CM | POA: Diagnosis not present

## 2018-11-23 DIAGNOSIS — R6889 Other general symptoms and signs: Secondary | ICD-10-CM | POA: Diagnosis not present

## 2018-11-23 DIAGNOSIS — R5381 Other malaise: Secondary | ICD-10-CM | POA: Diagnosis not present

## 2018-11-23 DIAGNOSIS — F321 Major depressive disorder, single episode, moderate: Secondary | ICD-10-CM

## 2018-11-23 DIAGNOSIS — E785 Hyperlipidemia, unspecified: Secondary | ICD-10-CM

## 2018-11-23 DIAGNOSIS — M797 Fibromyalgia: Secondary | ICD-10-CM | POA: Diagnosis not present

## 2018-11-23 DIAGNOSIS — G8929 Other chronic pain: Secondary | ICD-10-CM

## 2018-11-23 DIAGNOSIS — Z5181 Encounter for therapeutic drug level monitoring: Secondary | ICD-10-CM

## 2018-11-23 DIAGNOSIS — I420 Dilated cardiomyopathy: Secondary | ICD-10-CM

## 2018-11-23 DIAGNOSIS — E039 Hypothyroidism, unspecified: Secondary | ICD-10-CM

## 2018-11-23 DIAGNOSIS — M1990 Unspecified osteoarthritis, unspecified site: Secondary | ICD-10-CM | POA: Diagnosis not present

## 2018-11-23 DIAGNOSIS — Z23 Encounter for immunization: Secondary | ICD-10-CM

## 2018-11-23 DIAGNOSIS — Z881 Allergy status to other antibiotic agents status: Secondary | ICD-10-CM

## 2018-11-23 DIAGNOSIS — I4821 Permanent atrial fibrillation: Secondary | ICD-10-CM

## 2018-11-23 DIAGNOSIS — M3500 Sicca syndrome, unspecified: Secondary | ICD-10-CM

## 2018-11-23 DIAGNOSIS — J4540 Moderate persistent asthma, uncomplicated: Secondary | ICD-10-CM | POA: Diagnosis not present

## 2018-11-23 DIAGNOSIS — Z7901 Long term (current) use of anticoagulants: Secondary | ICD-10-CM

## 2018-11-23 DIAGNOSIS — E559 Vitamin D deficiency, unspecified: Secondary | ICD-10-CM

## 2018-11-23 DIAGNOSIS — D509 Iron deficiency anemia, unspecified: Secondary | ICD-10-CM

## 2018-11-23 DIAGNOSIS — R5383 Other fatigue: Secondary | ICD-10-CM

## 2018-11-23 LAB — COAGUCHEK XS/INR WAIVED
INR: 2 — ABNORMAL HIGH (ref 0.9–1.1)
Prothrombin Time: 23.9 s

## 2018-11-23 MED ORDER — GABAPENTIN 100 MG PO CAPS: 100.0000 mg | ORAL_CAPSULE | Freq: Two times a day (BID) | ORAL | 0 refills | Status: DC | PRN

## 2018-11-23 NOTE — Progress Notes (Signed)
Subjective:    Patient ID: Angela Wong, female    DOB: [birth year], 78 y.o.   MRN: [id number]  Chief Complaint  Patient presents with  . Fatigue    constant pain   Pt presents to the office today with worsening fatigue and chronic pain.  PT is followed by Cardiologists for CHF and A Fibthat are stable at this time.She is taking warfarin. See anticoagulation flowsheet. Followed by Rheumatologists every 3-6 months  and Ortho for arthritis pain. Followed by Allergists for asthma and chronic allergies.  Followed by GI for GERD.   Arthritis Presents for follow-up visit. She complains of pain and stiffness. The symptoms have been worsening. Affected locations include the right knee, left knee, left hip, right hip, left ankle and right ankle. Her pain is at a severity of 6/10. Associated symptoms include fatigue. Pertinent negatives include no diarrhea.  Asthma She complains of cough and hoarse voice. There is no shortness of breath or sputum production. This is a chronic problem. The current episode started more than 1 year ago. The problem occurs intermittently. Associated symptoms include malaise/fatigue. Pertinent negatives include no chest pain or headaches. Her past medical history is significant for asthma.  Thyroid Problem Presents for follow-up visit. Symptoms include depressed mood, fatigue and hoarse voice. Patient reports no diarrhea or dry skin. The symptoms have been stable. There is no history of heart failure.  Hypertension This is a chronic problem. The current episode started more than 1 year ago. The problem has been waxing and waning since onset. The problem is controlled. Associated symptoms include malaise/fatigue and peripheral edema ("worse at night"). Pertinent negatives include no chest pain, headaches or shortness of breath. Risk factors for coronary artery disease include dyslipidemia and sedentary lifestyle. Past treatments include lifestyle changes. There is no  history of CAD/MI or heart failure. Identifiable causes of hypertension include a thyroid problem.  Anemia Presents for follow-up visit. Symptoms include bruises/bleeds easily and malaise/fatigue. There is no history of heart failure.      Review of Systems  Constitutional: Positive for fatigue and malaise/fatigue.  HENT: Positive for hoarse voice.   Respiratory: Positive for cough. Negative for sputum production and shortness of breath.   Cardiovascular: Negative for chest pain.  Gastrointestinal: Negative for diarrhea.  Musculoskeletal: Positive for arthritis and stiffness.  Neurological: Negative for headaches.  Hematological: Bruises/bleeds easily.  All other systems reviewed and are negative.      Objective:   Physical Exam Vitals signs reviewed.  Constitutional:      General: She is not in acute distress.    Appearance: She is well-developed.  HENT:     Head: Normocephalic and atraumatic.  Eyes:     Pupils: Pupils are equal, round, and reactive to light.  Neck:     Musculoskeletal: Normal range of motion and neck supple.     Thyroid: No thyromegaly.  Cardiovascular:     Rate and Rhythm: Normal rate and regular rhythm.     Heart sounds: Normal heart sounds. No murmur.  Pulmonary:     Effort: Pulmonary effort is normal. No respiratory distress.     Breath sounds: Normal breath sounds. No wheezing.  Abdominal:     General: Bowel sounds are normal. There is no distension.     Palpations: Abdomen is soft.     Tenderness: There is no abdominal tenderness.  Musculoskeletal: Normal range of motion.        General: No tenderness.  Skin:    General:  Skin is warm and dry.  Neurological:     Mental Status: She is alert and oriented to person, place, and time.     Cranial Nerves: No cranial nerve deficit.     Motor: Weakness present.     Gait: Gait abnormal.     Deep Tendon Reflexes: Reflexes are normal and symmetric.  Psychiatric:        Behavior: Behavior normal.         Thought Content: Thought content normal.        Judgment: Judgment normal.       BP (!) 150/76   Pulse 73   Temp 97.7 F (36.5 C) (Temporal)   Ht '5\' 4"'  (1.626 m)   Wt 133 lb 3.2 oz (60.4 kg)   SpO2 100%   BMI 22.86 kg/m      Assessment & Plan:  Angela Wong comes in today with chief complaint of Fatigue (constant pain)   Diagnosis and orders addressed:  1. Atrial fibrillation, permanent ([place]) - CMP14+EGFR  2. Benign essential HTN - CMP14+EGFR  3. Congestive dilated cardiomyopathy (HCC) - CMP14+EGFR  4. Moderate persistent asthma without complication - VZS82+LMBE  5. Osteoarthritis, unspecified osteoarthritis type, unspecified site - CMP14+EGFR  6. Allergy to multiple antibiotics - CMP14+EGFR  7. Hyperlipidemia, unspecified hyperlipidemia type - CMP14+EGFR  8. Malaise and fatigue - CMP14+EGFR  9. Fibromyalgia Will try gabapentin today to see if this helps with pain - CMP14+EGFR - gabapentin (NEURONTIN) 100 MG capsule; Take 1 capsule (100 mg total) by mouth 2 (two) times daily as needed.  Dispense: 60 capsule; Refill: 0  10. Sjogren's syndrome, with unspecified organ involvement ([place]) - CMP14+EGFR  11. Generalized weakness - CMP14+EGFR  12. Depression, major, single episode, moderate (HCC) - CMP14+EGFR  13. Anticoagulation goal of INR 2 to 3 - CMP14+EGFR  Description   INR was 2.0 today (goal is 2.0 to 3.0) Perfect!  Continue current schedule of warfarin taking it the following way:  4 mg Monday, Wednesday, and Friday, 2 mg all other days.      14. Iron deficiency anemia, unspecified iron deficiency anemia type - Anemia Profile with CBC - CMP14+EGFR  15. Hypothyroidism, unspecified type - CMP14+EGFR - TSH  16. Vitamin D deficiency - CMP14+EGFR - Vitamin D 25 hydroxy  17. Other chronic pain - gabapentin (NEURONTIN) 100 MG capsule; Take 1 capsule (100 mg total) by mouth 2 (two) times daily as needed.  Dispense: 60 capsule;  Refill: 0   Labs pending Health Maintenance reviewed Diet and exercise encouraged  Follow up plan: 3 months   [name], FNP

## 2018-11-23 NOTE — Patient Instructions (Signed)

## 2018-11-24 LAB — CMP14+EGFR
ALT: 12 IU/L (ref 0–32)
AST: 14 IU/L (ref 0–40)
Albumin/Globulin Ratio: 1.5 (ref 1.2–2.2)
Albumin: 4.1 g/dL (ref 3.7–4.7)
Alkaline Phosphatase: 94 IU/L (ref 39–117)
BUN/Creatinine Ratio: 14 (ref 12–28)
BUN: 12 mg/dL (ref 8–27)
Bilirubin Total: 0.9 mg/dL (ref 0.0–1.2)
CO2: 28 mmol/L (ref 20–29)
Calcium: 9.5 mg/dL (ref 8.7–10.3)
Chloride: 99 mmol/L (ref 96–106)
Creatinine, Ser: 0.87 mg/dL (ref 0.57–1.00)
GFR calc Af Amer: 73 mL/min/{1.73_m2} (ref >59)
GFR calc non Af Amer: 64 mL/min/{1.73_m2} (ref >59)
Globulin, Total: 2.7 g/dL (ref 1.5–4.5)
Glucose: 103 mg/dL — ABNORMAL HIGH (ref 65–99)
Potassium: 3.8 mmol/L (ref 3.5–5.2)
Sodium: 141 mmol/L (ref 134–144)
Total Protein: 6.8 g/dL (ref 6.0–8.5)

## 2018-11-24 LAB — ANEMIA PROFILE B
Basophils Absolute: 0 10*3/uL (ref 0.0–0.2)
Basos: 0 %
EOS (ABSOLUTE): 0 10*3/uL (ref 0.0–0.4)
Eos: 0 %
Ferritin: 75 ng/mL (ref 15–150)
Folate: >20 ng/mL (ref >3.0)
Hematocrit: 45 % (ref 34.0–46.6)
Hemoglobin: 15 g/dL (ref 11.1–15.9)
Immature Grans (Abs): 0 10*3/uL (ref 0.0–0.1)
Immature Granulocytes: 0 %
Iron Saturation: 27 % (ref 15–55)
Iron: 74 ug/dL (ref 27–139)
Lymphocytes Absolute: 1.2 10*3/uL (ref 0.7–3.1)
Lymphs: 12 %
MCH: 30.7 pg (ref 26.6–33.0)
MCHC: 33.3 g/dL (ref 31.5–35.7)
MCV: 92 fL (ref 79–97)
Monocytes Absolute: 0.4 10*3/uL (ref 0.1–0.9)
Monocytes: 4 %
Neutrophils Absolute: 8 10*3/uL — ABNORMAL HIGH (ref 1.4–7.0)
Neutrophils: 84 %
Platelets: 199 10*3/uL (ref 150–450)
RBC: 4.89 x10E6/uL (ref 3.77–5.28)
RDW: 12.8 % (ref 11.7–15.4)
Retic Ct Pct: 1.5 % (ref 0.6–2.6)
Total Iron Binding Capacity: 278 ug/dL (ref 250–450)
UIBC: 204 ug/dL (ref 118–369)
Vitamin B-12: 801 pg/mL (ref 232–1245)
WBC: 9.7 10*3/uL (ref 3.4–10.8)

## 2018-11-24 LAB — VITAMIN D 25 HYDROXY (VIT D DEFICIENCY, FRACTURES): Vit D, 25-Hydroxy: 48.3 ng/mL (ref 30.0–100.0)

## 2018-11-24 LAB — TSH: TSH: 1.26 u[IU]/mL (ref 0.450–4.500)

## 2018-11-26 LAB — SPECIMEN STATUS REPORT

## 2018-11-30 ENCOUNTER — Other Ambulatory Visit: Payer: Self-pay | Admitting: Family

## 2018-12-01 ENCOUNTER — Ambulatory Visit (INDEPENDENT_AMBULATORY_CARE_PROVIDER_SITE_OTHER): Payer: Medicare Other | Admitting: Licensed Clinical Social Worker

## 2018-12-01 DIAGNOSIS — M797 Fibromyalgia: Secondary | ICD-10-CM

## 2018-12-01 DIAGNOSIS — I4821 Permanent atrial fibrillation: Secondary | ICD-10-CM | POA: Diagnosis not present

## 2018-12-01 DIAGNOSIS — M1990 Unspecified osteoarthritis, unspecified site: Secondary | ICD-10-CM

## 2018-12-01 DIAGNOSIS — R5381 Other malaise: Secondary | ICD-10-CM

## 2018-12-01 DIAGNOSIS — I7300 Raynaud's syndrome without gangrene: Secondary | ICD-10-CM

## 2018-12-01 DIAGNOSIS — F321 Major depressive disorder, single episode, moderate: Secondary | ICD-10-CM | POA: Diagnosis not present

## 2018-12-01 DIAGNOSIS — M3500 Sicca syndrome, unspecified: Secondary | ICD-10-CM

## 2018-12-01 NOTE — Patient Instructions (Addendum)
Licensed Clinical Social Worker Visit Information  Goals we discussed today:  Goals    . Client will talk with LCSW in next 30 days to discuss stress/anxiety related to managing her health needs (pt-stated)     Current Barriers:  . Pain issues faced . Social isolation . Mobility challenges . Medication costs  Clinical Social Work Clinical Goal(s):  [name], [name]  Interventions: . Provided counseling support for client . Previously talked with client about her social support network   Talked with client about pain issues of client  Talked with client about client's decreased energy  Talked with client about transport needs of client  Patient Self Care Activities:  . Self administers medications as prescribed . Attends all scheduled provider appointments . Performs ADL's independently  Plan:  Attends client scheduled medical appointments Client to call RNCM to talk with RNCM about nursing needs of client LCSW to call client in next 3 weeks to talk with client about psychosocial needs of client Client to use relaxation techniques of choice to help her manage symptoms faced  Initial goal documentation            Materials Provided: No  Follow Up Plan: LCSW to call client in next 3 weeks to talk with client about the psychosocial needs of client  The patient verbalized understanding of instructions provided today and declined a print copy of patient instruction materials.   [name].Amenah Tucci MSW, LCSW Licensed Clinical Social Worker [facility] [phone]

## 2018-12-01 NOTE — Chronic Care Management (AMB) (Addendum)
Care Management Note   Angela Wong is a 78 y.o. year old female who is a primary care patient of [name], FNP. The CM team was consulted for assistance with chronic disease management and care coordination.   I reached out to [name] by phone today.    Review of patient status, including review of consultants reports, relevant laboratory and other test results, and collaboration with appropriate care team members and the patient's provider was performed as part of comprehensive patient evaluation and provision of chronic care management services.   Social determinants of health: risk of social isolation; risk of financial strain    Office Visit from 12/18/2017 in [facility]  PHQ-9 Total Score  8     Medications   New medications from outside sources are available for reconciliation   albuterol (VENTOLIN HFA) 108 (90 Base) MCG/ACT inhaler    atenolol (TENORMIN) 25 MG tablet    B Complex-C (B-COMPLEX WITH VITAMIN C) tablet    budesonide-formoterol (SYMBICORT) 160-4.5 MCG/ACT inhaler    bumetanide (BUMEX) 0.5 MG tablet    bumetanide (BUMEX) 1 MG tablet    Cholecalciferol (VITAMIN D) 2000 UNITS CAPS    diphenhydrAMINE (BENADRYL) 25 MG tablet    docusate sodium (COLACE) 100 MG capsule    esomeprazole (NEXIUM) 40 MG capsule    fexofenadine (ALLEGRA) 180 MG tablet    gabapentin (NEURONTIN) 100 MG capsule    levothyroxine (EUTHYROX) 50 MCG tablet    losartan (COZAAR) 100 MG tablet    Magnesium 200 MG TABS    metroNIDAZOLE (METROGEL) 1 % gel    montelukast (SINGULAIR) 10 MG tablet    Olopatadine HCl 0.2 % SOLN    POLY-IRON 150 FORTE 150-25-1 MG-MCG-MG CAPS    predniSONE (DELTASONE) 5 MG tablet    PREVIDENT 5000 DRY MOUTH 1.1 % GEL dental gel    RESTASIS 0.05 % ophthalmic emulsion    Simethicone (GAS-X EXTRA STRENGTH) 125 MG CAPS    traZODone (DESYREL) 150 MG tablet    warfarin (COUMADIN) 1 MG tablet    Goals        . Client will talk  with LCSW in next 30 days to discuss stress/anxiety related to managing her health needs (pt-stated)     Current Barriers:  . Pain issues faced . Social isolation . Mobility challenges . Medication costs  Clinical Social Work Clinical Goal(s):  [name], [name]  Interventions: . Previously talked with client about her social support network  . Talked with client about pain issues of client . Talked with client about client's decreased energy . Talked with client about transport needs of client . Provided counseling support for client  Patient Self Care Activities:  . Self administers medications as prescribed . Attends all scheduled provider appointments . Performs ADL's independently  Plan:  Attends client scheduled medical appointments Client to call RNCM to talk with RNCM about nursing needs of client LCSW to call client in next 3 weeks to talk with client about psychosocial needs of client Client to use relaxation techniques of choice to help her manage symptoms faced  Initial goal documentation       Follow Up Plan: LCSW to call client in next 3 weeks to talk with client about the psychosocial needs of client  [name].Jaley Yan MSW, LCSW Licensed Clinical Social Worker [place]  [facility] [phone]  I have reviewed and agree with the above AWV documentation.   [name], FNP

## 2018-12-22 DIAGNOSIS — I4821 Permanent atrial fibrillation: Secondary | ICD-10-CM | POA: Diagnosis not present

## 2018-12-22 DIAGNOSIS — Z7901 Long term (current) use of anticoagulants: Secondary | ICD-10-CM | POA: Diagnosis not present

## 2018-12-23 ENCOUNTER — Ambulatory Visit (INDEPENDENT_AMBULATORY_CARE_PROVIDER_SITE_OTHER): Payer: Medicare Other | Admitting: Licensed Clinical Social Worker

## 2018-12-23 DIAGNOSIS — E039 Hypothyroidism, unspecified: Secondary | ICD-10-CM

## 2018-12-23 DIAGNOSIS — I4821 Permanent atrial fibrillation: Secondary | ICD-10-CM | POA: Diagnosis not present

## 2018-12-23 DIAGNOSIS — K219 Gastro-esophageal reflux disease without esophagitis: Secondary | ICD-10-CM

## 2018-12-23 DIAGNOSIS — E785 Hyperlipidemia, unspecified: Secondary | ICD-10-CM | POA: Diagnosis not present

## 2018-12-23 DIAGNOSIS — M797 Fibromyalgia: Secondary | ICD-10-CM

## 2018-12-23 NOTE — Chronic Care Management (AMB) (Addendum)
Care Management Note   Angela Wong is a 78 y.o. year old female who is a primary care patient of [name], FNP. The CM team was consulted for assistance with chronic disease management and care coordination.   I reached out to [name] by phone today.   Review of patient status, including review of consultants reports, relevant laboratory and other test results, and collaboration with appropriate care team members and the patient's provider was performed as part of comprehensive patient evaluation and provision of chronic care management services.   Social determinants of health: risk of social isolation; risk of financial strain    Office Visit from 12/18/2017 in [facility]  PHQ-9 Total Score  8      Medications   New medications from outside sources are available for reconciliation   albuterol (VENTOLIN HFA) 108 (90 Base) MCG/ACT inhaler    atenolol (TENORMIN) 25 MG tablet    B Complex-C (B-COMPLEX WITH VITAMIN C) tablet    budesonide-formoterol (SYMBICORT) 160-4.5 MCG/ACT inhaler    bumetanide (BUMEX) 0.5 MG tablet    bumetanide (BUMEX) 1 MG tablet    Cholecalciferol (VITAMIN D) 2000 UNITS CAPS    diphenhydrAMINE (BENADRYL) 25 MG tablet    docusate sodium (COLACE) 100 MG capsule    esomeprazole (NEXIUM) 40 MG capsule    fexofenadine (ALLEGRA) 180 MG tablet    gabapentin (NEURONTIN) 100 MG capsule    levothyroxine (EUTHYROX) 50 MCG tablet    losartan (COZAAR) 100 MG tablet    Magnesium 200 MG TABS    metroNIDAZOLE (METROGEL) 1 % gel    montelukast (SINGULAIR) 10 MG tablet    Olopatadine HCl 0.2 % SOLN    POLY-IRON 150 FORTE 150-25-1 MG-MCG-MG CAPS    predniSONE (DELTASONE) 5 MG tablet    PREVIDENT 5000 DRY MOUTH 1.1 % GEL dental gel    RESTASIS 0.05 % ophthalmic emulsion    Simethicone (GAS-X EXTRA STRENGTH) 125 MG CAPS    traZODone (DESYREL) 150 MG tablet    warfarin (COUMADIN) 1 MG tablet    Goals Addressed             This Visit's Progress    Client will talk with LCSW in next 30 days to discuss stress/anxiety related to managing her health needs (pt-stated)       Current Barriers:  Stress issues of client with chronic diagnoses of Hypothyroidism, Atrial Fibrillation, Fibromyalgia, GERD, and Hyperlipidemia Pain issues faced Social isolation Medication costs  Clinical Social Work Clinical Goal(s):  Over the next 30  days, [name]  Interventions: Encouraged client to talk with RNCM regarding nursing needs of client Talked previously with clinet about relaxation techniques or choice (enjoys reading to help her relax) Talked with client about her social support network  Talked with client about pain issues of client Talked with client about client's decreased energy Talked with client about stress issues faced by client Talked with client about sleeping issues of client Provided counseling support for client  Patient Self Care Activities:  Self administers medications as prescribed Attends all scheduled provider appointments Performs ADL's independently  Plan:  Attends client scheduled medical appointments Client to call RNCM to talk with RNCM about nursing needs of client LCSW to call client in next 4 weeks to talk with client about psychosocial needs of client Client to use relaxation techniques of choice to help her manage symptoms  faced  Initial goal documentation       Follow Up Plan: LCSW to call client in next 4 weeks to talk with client about the psychosocial needs of client at that time  [name].Shavone Nevers MSW, LCSW Licensed Clinical Social Worker [facility] [phone]  I have reviewed and agree with the above documentation.   [name], FNP

## 2018-12-23 NOTE — Patient Instructions (Addendum)
Licensed Clinical Social Worker Visit Information  Goals we discussed today:  Goals Addressed            This Visit's Progress   . Client will talk with LCSW in next 30 days to discuss stress/anxiety related to managing her health needs (pt-stated)       Current Barriers:   Stress issues of client with chronic diagnoses of Hypothyroidism, Atrial Fibrillation, Fibromyalgia, GERD, and Hyperlipidemia . Pain issues faced . Social isolation . Medication costs  Clinical Social Work Clinical Goal(s):  [name], [name]  Interventions:  Encouraged client to talk with RNCM regarding nursing needs of client  Talked previously with clinet about relaxation techniques or choice (enjoys reading to help her relax)  Talked with client about her social support network  Talked with client about pain issues of client  Talked with client about client's decreased energy  Talked with client about stress issues faced by client  Talked with client about sleeping issues of client  Provided counseling support for client  Patient Self Care Activities:  . Self administers medications as prescribed . Attends all scheduled provider appointments . Performs ADL's independently  Plan:  Attends client scheduled medical appointments Client to call RNCM to talk with RNCM about nursing needs of client LCSW to call client in next 4 weeks to talk with client about psychosocial needs of client Client to use relaxation techniques of choice to help her manage symptoms faced  Initial goal documentation        Materials Provided: No  Follow Up Plan: LCSW to call client in next 4 weeks to talk with client about the psychosocial needs of client at that time  The patient verbalized understanding of instructions provided today and declined a print copy of patient instruction materials.    [name].Nissan Frazzini MSW, LCSW Licensed Clinical Social Worker [facility] [phone]

## 2019-01-04 ENCOUNTER — Other Ambulatory Visit: Payer: Self-pay | Admitting: *Deleted

## 2019-01-04 DIAGNOSIS — G8929 Other chronic pain: Secondary | ICD-10-CM

## 2019-01-04 DIAGNOSIS — G4700 Insomnia, unspecified: Secondary | ICD-10-CM

## 2019-01-04 DIAGNOSIS — M797 Fibromyalgia: Secondary | ICD-10-CM

## 2019-01-04 DIAGNOSIS — E039 Hypothyroidism, unspecified: Secondary | ICD-10-CM

## 2019-01-04 MED ORDER — GABAPENTIN 100 MG PO CAPS: 100.0000 mg | ORAL_CAPSULE | Freq: Two times a day (BID) | ORAL | 1 refills | Status: DC | PRN

## 2019-01-04 MED ORDER — ESOMEPRAZOLE MAGNESIUM 40 MG PO CPDR: 40.0000 mg | DELAYED_RELEASE_CAPSULE | Freq: Two times a day (BID) | ORAL | 2 refills | Status: DC

## 2019-01-04 MED ORDER — TRAZODONE HCL 150 MG PO TABS: 150.0000 mg | ORAL_TABLET | Freq: Every day | ORAL | 2 refills | Status: DC

## 2019-01-04 MED ORDER — WARFARIN SODIUM 1 MG PO TABS: ORAL_TABLET | ORAL | 0 refills | Status: DC

## 2019-01-04 MED ORDER — POLY-IRON 150 FORTE 150-25-1 MG-MCG-MG PO CAPS: 1.0000 | ORAL_CAPSULE | Freq: Every day | ORAL | 1 refills | Status: DC

## 2019-01-04 MED ORDER — LEVOTHYROXINE SODIUM 50 MCG PO TABS: 100.0000 ug | ORAL_TABLET | Freq: Every morning | ORAL | 3 refills | Status: DC

## 2019-01-04 MED ORDER — LOSARTAN POTASSIUM 100 MG PO TABS: 100.0000 mg | ORAL_TABLET | Freq: Every day | ORAL | 1 refills | Status: DC

## 2019-01-04 NOTE — Addendum Note (Signed)
Addended by: Antonietta Barcelona D on: 01/04/2019 04:18 PM   Modules accepted: Orders

## 2019-01-21 ENCOUNTER — Ambulatory Visit (INDEPENDENT_AMBULATORY_CARE_PROVIDER_SITE_OTHER): Payer: Medicare Other | Admitting: Licensed Clinical Social Worker

## 2019-01-21 DIAGNOSIS — E785 Hyperlipidemia, unspecified: Secondary | ICD-10-CM | POA: Diagnosis not present

## 2019-01-21 DIAGNOSIS — I10 Essential (primary) hypertension: Secondary | ICD-10-CM | POA: Diagnosis not present

## 2019-01-21 DIAGNOSIS — I4821 Permanent atrial fibrillation: Secondary | ICD-10-CM | POA: Diagnosis not present

## 2019-01-21 DIAGNOSIS — F321 Major depressive disorder, single episode, moderate: Secondary | ICD-10-CM | POA: Diagnosis not present

## 2019-01-21 DIAGNOSIS — M797 Fibromyalgia: Secondary | ICD-10-CM

## 2019-01-21 DIAGNOSIS — K219 Gastro-esophageal reflux disease without esophagitis: Secondary | ICD-10-CM

## 2019-01-21 NOTE — Patient Instructions (Addendum)
Licensed Clinical Social Worker Visit Information  Goals we discussed today:  Goals Addressed            This Visit's Progress   . Client will talk with LCSW in next 30 days to discuss stress/anxiety related to managing her health needs (pt-stated)       Current Barriers:  . Stress issues of client with chronic diagnoses of Atrial Fibrillation, GERD, HTN, HLD, Firbromyalgia, Depression . Pain issues faced . Social isolation . Medication costs  Clinical Social Work Clinical Goal(s):  [name], [name]  Interventions:  Encouraged client to talk with RNCM regarding nursing needs of client  Talked with clinet about relaxation techniques or choice (enjoys reading to help her relax, enjoys caring for her pet dog)  Talked with client about her social support network (son and daughter in law, friend [name]) Talked with client about pain issues of client  Talked with client about client's decreased energy  Talked with client about stress issues faced by client  Talked with client about sleeping issues of client  Provided counseling support for client  Talked with client about ambulation challenges of client  Talked with Triage Nurse Lynnea Ferrier about client nursing needs  Patient Self Care Activities:  . Self administers medications as prescribed . Attends all scheduled provider appointments . Performs ADL's independently  Plan:  Attends client scheduled medical appointments Client to call RNCM to talk with RNCM about nursing needs of client LCSW to call client in next 4 weeks to talk with client about psychosocial needs of client Client to use relaxation techniques of choice to help her manage symptoms faced  Initial goal documentation         Materials Provided: No  Follow Up Plan: LCSW to call client in next 4 weeks to talk with client about the psychosocial  needs of client at that time  The patient verbalized understanding of instructions provided today and declined a print copy of patient instruction materials.   [name].Tashe Purdon MSW, LCSW Licensed Clinical Social Worker [facility] [phone]

## 2019-01-21 NOTE — Chronic Care Management (AMB) (Addendum)
Care Management Note   Angela Wong is a 79 y.o. year old female who is a primary care patient of [name], FNP. The CM team was consulted for assistance with chronic disease management and care coordination.   I reached out to [place]. [place] via phone today  Review of patient status, including review of consultants reports, relevant laboratory and other test results, and collaboration with appropriate care team members and the patient's provider was performed as part of comprehensive patient evaluation and provision of chronic care management services.   Social determinants of health: risk of social isolation; risk of financial strain    Office Visit from 12/18/2017 in [facility]  PHQ-9 Total Score  8      Medications    (very important)  New medications from outside sources are available for reconciliation   albuterol (VENTOLIN HFA) 108 (90 Base) MCG/ACT inhaler atenolol (TENORMIN) 25 MG tablet B Complex-C (B-COMPLEX WITH VITAMIN C) tablet budesonide-formoterol (SYMBICORT) 160-4.5 MCG/ACT inhaler bumetanide (BUMEX) 0.5 MG tablet bumetanide (BUMEX) 1 MG tablet Cholecalciferol (VITAMIN D) 2000 UNITS CAPS diphenhydrAMINE (BENADRYL) 25 MG tablet docusate sodium (COLACE) 100 MG capsule esomeprazole (NEXIUM) 40 MG capsule fexofenadine (ALLEGRA) 180 MG tablet gabapentin (NEURONTIN) 100 MG capsule Iron Polysacch Cmplx-B12-FA (POLY-IRON 150 FORTE) 150-0.025-1 MG CAPS levothyroxine (EUTHYROX) 50 MCG tablet losartan (COZAAR) 100 MG tablet Magnesium 200 MG TABS metroNIDAZOLE (METROGEL) 1 % gel montelukast (SINGULAIR) 10 MG tablet Olopatadine HCl 0.2 % SOLN predniSONE (DELTASONE) 5 MG tablet PREVIDENT 5000 DRY MOUTH 1.1 % GEL dental gel RESTASIS 0.05 % ophthalmic emulsion Simethicone (GAS-X EXTRA STRENGTH) 125 MG CAPS traZODone (DESYREL) 150 MG tablet warfarin (COUMADIN) 1 MG tablet  Goals Addressed             This Visit's Progress   Client will talk with LCSW in next 30 days to discuss stress/anxiety related to managing her health needs (pt-stated)       Current Barriers:  Stress issues of client with chronic diagnoses of Atrial Fibrillation, GERD, HTN, HLD, Firbromyalgia, Depression Pain issues faced Social isolation Medication costs  Clinical Social Work Clinical Goal(s):  Over the next 30  days, [name]  Interventions: Encouraged client to talk with RNCM regarding nursing needs of client Talked with clinet about relaxation techniques or choice (enjoys reading to help her relax, enjoys caring for her pet dog) Talked with client about her social support network (son and daughter in law, friend [name]) Talked with client about pain issues of client Talked with client about client's decreased energy Talked with client about stress issues faced by client Talked with client about sleeping issues of client Provided counseling support for client Talked with client about ambulation challenges of client Talked with Triage Nurse Lynnea Ferrier about client nursing needs   Patient Self Care Activities:  Self administers medications as prescribed Attends all scheduled provider appointments Performs ADL's independently  Plan:  Attends client scheduled medical appointments Client to call RNCM to talk with RNCM about nursing needs of client LCSW to call client in next 4 weeks to talk with client about psychosocial needs of client Client to use relaxation techniques of choice to help her manage symptoms faced  Initial goal documentation        Follow Up Plan: LCSW to call client in next 4 weeks to talk with client about the psychosocial needs of client at that time  [name].Elanor Cale  MSW, LCSW Licensed Clinical Social Worker [facility] Care Management [phone]  I have reviewed and agree  with the above  documentation.   [name], FNP

## 2019-01-22 ENCOUNTER — Ambulatory Visit (INDEPENDENT_AMBULATORY_CARE_PROVIDER_SITE_OTHER): Payer: Medicare Other | Admitting: Family

## 2019-01-22 ENCOUNTER — Encounter: Payer: Self-pay | Admitting: Family

## 2019-01-22 DIAGNOSIS — R531 Weakness: Secondary | ICD-10-CM

## 2019-01-22 DIAGNOSIS — M1990 Unspecified osteoarthritis, unspecified site: Secondary | ICD-10-CM | POA: Diagnosis not present

## 2019-01-22 DIAGNOSIS — M797 Fibromyalgia: Secondary | ICD-10-CM

## 2019-01-22 DIAGNOSIS — R5383 Other fatigue: Secondary | ICD-10-CM | POA: Diagnosis not present

## 2019-01-22 DIAGNOSIS — F321 Major depressive disorder, single episode, moderate: Secondary | ICD-10-CM | POA: Diagnosis not present

## 2019-01-22 DIAGNOSIS — R5381 Other malaise: Secondary | ICD-10-CM

## 2019-01-22 NOTE — Progress Notes (Signed)
Virtual Visit via telephone Note Due to COVID-19 pandemic this visit was conducted virtually. This visit type was conducted due to national recommendations for restrictions regarding the COVID-19 Pandemic (e.g. social distancing, sheltering in place) in an effort to limit this patient's exposure and mitigate transmission in our community. All issues noted in this document were discussed and addressed.  A physical exam was not performed with this format.  I connected with Angela Wong on 01/22/19 at 11:05 AM by telephone and verified that I am speaking with the correct person using two identifiers. Angela Wong is currently located at home and no one  is currently with her during visit. The provider, [name], FNP is located in their office at time of visit.  I discussed the limitations, risks, security and privacy concerns of performing an evaluation and management service by telephone and the availability of in person appointments. I also discussed with the patient that there may be a patient responsible charge related to this service. The patient expressed understanding and agreed to proceed.   History and Present Illness:  Pt calls the office today with increased weakness. She reports she has chronic pain and her "joints can not hold me up". She reports she can not stand to cook and has a hard time walking to the bathroom.   She is followed by Rheumatologists. She current is taking prednisone 5 mg daily. She has taken  Methotrexate in the past.   She reports she has worked with PT in the past, but every times she works with them she is "sore for weeks" and can not do daily activities.   She tried the gabapentin 100 mg twice, but caused significant drowsiness, changes in her hearing, and brain fogginess.   Arthritis Presents for follow-up visit. She complains of pain and stiffness. The symptoms have been worsening. Affected locations include the left shoulder, right shoulder,  right knee, left knee, left ankle, right ankle, left foot and right foot (back). Her pain is at a severity of 3/10 (3 sitting, up walking or turning can be a 10). Associated symptoms include dry mouth and fatigue.      Review of Systems  Constitutional: Positive for fatigue.  Musculoskeletal: Positive for arthritis and stiffness.     Observations/Objective: No SOB or distress noted  Assessment and Plan: Angela Wong comes in today with chief complaint of No chief complaint on file.   Diagnosis and orders addressed:  1. Fibromyalgia  2. Malaise and fatigue  3. Generalized weakness  4. Depression, major, single episode, moderate ([place])  5. Osteoarthritis, unspecified osteoarthritis type, unspecified site   Long discussion with patient about encouraging exercise and management of chronic pain.  She has worked with PT multiple times and states she is so sore she can not do her daily activities. Encouraged her to call her Rheumatologists and change medication. She does not wish to go to a SNF at this. She wants to hold off on PT at this time to see if Rheumatologists.   Follow up plan: As needed and keep chronic follow up      I discussed the assessment and treatment plan with the patient. The patient was provided an opportunity to ask questions and all were answered. The patient agreed with the plan and demonstrated an understanding of the instructions.   The patient was advised to call back or seek an in-person evaluation if the symptoms worsen or if the condition fails to improve as anticipated.  The  above assessment and management plan was discussed with the patient. The patient verbalized understanding of and has agreed to the management plan. Patient is aware to call the clinic if symptoms persist or worsen. Patient is aware when to return to the clinic for a follow-up visit. Patient educated on when it is appropriate to go to the emergency department.   Time call  ended:  11:36 AM  I provided 31 minutes of non-face-to-face time during this encounter.    [name], FNP

## 2019-02-16 DIAGNOSIS — Z7901 Long term (current) use of anticoagulants: Secondary | ICD-10-CM | POA: Diagnosis not present

## 2019-02-16 DIAGNOSIS — I4821 Permanent atrial fibrillation: Secondary | ICD-10-CM | POA: Diagnosis not present

## 2019-02-19 ENCOUNTER — Telehealth: Payer: Self-pay

## 2019-02-23 ENCOUNTER — Ambulatory Visit: Payer: Medicare Other | Admitting: Family

## 2019-02-24 ENCOUNTER — Encounter: Payer: Self-pay | Admitting: Family

## 2019-02-24 ENCOUNTER — Ambulatory Visit (INDEPENDENT_AMBULATORY_CARE_PROVIDER_SITE_OTHER): Payer: Medicare Other | Admitting: Family

## 2019-02-24 ENCOUNTER — Other Ambulatory Visit: Payer: Self-pay

## 2019-02-24 VITALS — BP 137/86 | HR 61

## 2019-02-24 DIAGNOSIS — R5383 Other fatigue: Secondary | ICD-10-CM

## 2019-02-24 DIAGNOSIS — E039 Hypothyroidism, unspecified: Secondary | ICD-10-CM | POA: Diagnosis not present

## 2019-02-24 DIAGNOSIS — F321 Major depressive disorder, single episode, moderate: Secondary | ICD-10-CM | POA: Diagnosis not present

## 2019-02-24 DIAGNOSIS — R5381 Other malaise: Secondary | ICD-10-CM | POA: Diagnosis not present

## 2019-02-24 DIAGNOSIS — K219 Gastro-esophageal reflux disease without esophagitis: Secondary | ICD-10-CM | POA: Diagnosis not present

## 2019-02-24 DIAGNOSIS — R531 Weakness: Secondary | ICD-10-CM | POA: Diagnosis not present

## 2019-02-24 DIAGNOSIS — M1990 Unspecified osteoarthritis, unspecified site: Secondary | ICD-10-CM

## 2019-02-24 DIAGNOSIS — E785 Hyperlipidemia, unspecified: Secondary | ICD-10-CM

## 2019-02-24 DIAGNOSIS — J4540 Moderate persistent asthma, uncomplicated: Secondary | ICD-10-CM | POA: Diagnosis not present

## 2019-02-24 DIAGNOSIS — I10 Essential (primary) hypertension: Secondary | ICD-10-CM | POA: Diagnosis not present

## 2019-02-24 DIAGNOSIS — M3500 Sicca syndrome, unspecified: Secondary | ICD-10-CM

## 2019-02-24 DIAGNOSIS — M797 Fibromyalgia: Secondary | ICD-10-CM | POA: Diagnosis not present

## 2019-02-24 DIAGNOSIS — K589 Irritable bowel syndrome without diarrhea: Secondary | ICD-10-CM

## 2019-02-24 NOTE — Progress Notes (Signed)
Virtual Visit via telephone Note Due to COVID-19 pandemic this visit was conducted virtually. This visit type was conducted due to national recommendations for restrictions regarding the COVID-19 Pandemic (e.g. social distancing, sheltering in place) in an effort to limit this patient's exposure and mitigate transmission in our community. All issues noted in this document were discussed and addressed.  A physical exam was not performed with this format.  I connected with Angela Wong on 02/24/19 at 9:55 AM by telephone and verified that I am speaking with the correct person using two identifiers. Angela Wong is currently located at home and no one is currently with her during visit. The provider, [name], FNP is located in their office at time of visit.  I discussed the limitations, risks, security and privacy concerns of performing an evaluation and management service by telephone and the availability of in person appointments. I also discussed with the patient that there may be a patient responsible charge related to this service. The patient expressed understanding and agreed to proceed.   History and Present Illness:  Pt calls the  office today for chronic follow up. She has several problems today that include sinus pain, TMJ pain, migraines, SOB with exertion, chronic pain, joint pain, weakness, stomach pain, muscle cramps, and fatigue today.   PT is followed by Cardiologists for CHF and A Fibthat are stable at this time.She is taking warfarin. Followed by Rheumatologists every 3-6 months  and Ortho for arthritis pain. Followed by Allergists for asthma and chronic allergies. Followed by GI for GERD and IBS.  She is also followed by chronic care management monthly.  Hypertension This is a chronic problem. The current episode started more than 1 year ago. The problem has been resolved since onset. The problem is controlled. Associated symptoms include malaise/fatigue and  shortness of breath. Pertinent negatives include no peripheral edema. Risk factors for coronary artery disease include dyslipidemia, post-menopausal state and sedentary lifestyle. The current treatment provides moderate improvement. Hypertensive end-organ damage includes CAD/MI and heart failure. Identifiable causes of hypertension include a thyroid problem.  Asthma She complains of cough and shortness of breath. There is no difficulty breathing. This is a chronic problem. The current episode started more than 1 year ago. The problem occurs intermittently. Associated symptoms include appetite change, heartburn and malaise/fatigue. Her past medical history is significant for asthma.  Gastroesophageal Reflux She complains of belching, coughing and heartburn. This is a chronic problem. The current episode started more than 1 year ago. The problem occurs occasionally. The problem has been waxing and waning. Associated symptoms include fatigue. She has tried a diet change for the symptoms. The treatment provided mild relief.  Thyroid Problem Presents for follow-up visit. Symptoms include constipation, depressed mood, dry skin and fatigue. The symptoms have been stable. Her past medical history is significant for heart failure and hyperlipidemia.  Arthritis Presents for follow-up visit. Affected locations include the right knee, left hip, right hip, right MCP, left shoulder, right shoulder, left ankle, right ankle, right toes and left toes. Her pain is at a severity of 9/10. Associated symptoms include fatigue.  Depression        This is a chronic problem.  The current episode started more than 1 year ago.   The onset quality is gradual.   The problem occurs constantly.  The problem has been gradually improving since onset.  Associated symptoms include fatigue, helplessness, hopelessness, insomnia, restlessness, decreased interest, appetite change and sad.  The symptoms are aggravated by family issues.  Past  medical history includes thyroid problem.   Hyperlipidemia This is a chronic problem. The current episode started more than 1 year ago. The problem is uncontrolled. Recent lipid tests were reviewed and are high. Associated symptoms include shortness of breath.      Review of Systems  Constitutional: Positive for appetite change, fatigue and malaise/fatigue.  Respiratory: Positive for cough and shortness of breath.   Gastrointestinal: Positive for constipation and heartburn.  Musculoskeletal: Positive for arthritis.  Psychiatric/Behavioral: Positive for depression. The patient has insomnia.   All other systems reviewed and are negative.    Observations/Objective: No SOB or distress noted, pt very concern about her health and flat affect.  Assessment and Plan: Angela Wong comes in today with chief complaint of No chief complaint on file.   Diagnosis and orders addressed:  1. Benign essential HTN - CMP14+EGFR - CBC with Differential/Platelet - Amb Referral to Palliative Care  2. Moderate persistent asthma without complication - NBV67+OLID - CBC with Differential/Platelet - Amb Referral to Palliative Care  3. Gastroesophageal reflux disease, unspecified whether esophagitis present - CMP14+EGFR - CBC with Differential/Platelet - Amb Referral to Palliative Care  4. Irritable bowel syndrome, unspecified type - CMP14+EGFR - CBC with Differential/Platelet - Amb Referral to Palliative Care  5. Hypothyroidism, unspecified type - CMP14+EGFR - CBC with Differential/Platelet - TSH - Amb Referral to Palliative Care  6. Osteoarthritis, unspecified osteoarthritis type, unspecified site - CMP14+EGFR - CBC with Differential/Platelet - Amb Referral to Palliative Care  7. Depression, major, single episode, moderate (HCC) - CMP14+EGFR - CBC with Differential/Platelet - Amb Referral to Palliative Care  8. Fibromyalgia - CMP14+EGFR - CBC with Differential/Platelet - Amb  Referral to Palliative Care  9. Generalized weakness - CMP14+EGFR - CBC with Differential/Platelet - Amb Referral to Palliative Care  10. Malaise and fatigue - CMP14+EGFR - CBC with Differential/Platelet - Amb Referral to Palliative Care  11. Sjogren's syndrome, with unspecified organ involvement ([place]) - CMP14+EGFR - CBC with Differential/Platelet - Amb Referral to Palliative Care  12. Hyperlipidemia, unspecified hyperlipidemia type - CMP14+EGFR - CBC with Differential/Platelet - Lipid panel - Amb Referral to Palliative Care  Pt very depressed and has multiple problems today. She can not change her medications because she "can not tolerate it" and she can not work with PT because it does not help and causes more pain. I have placed a referral to Palliative care to see if they can offer any resources for her.  Labs pending Health Maintenance reviewed Diet and exercise encouraged  Follow up plan: 3 months     I discussed the assessment and treatment plan with the patient. The patient was provided an opportunity to ask questions and all were answered. The patient agreed with the plan and demonstrated an understanding of the instructions.   The patient was advised to call back or seek an in-person evaluation if the symptoms worsen or if the condition fails to improve as anticipated.  The above assessment and management plan was discussed with the patient. The patient verbalized understanding of and has agreed to the management plan. Patient is aware to call the clinic if symptoms persist or worsen. Patient is aware when to return to the clinic for a follow-up visit. Patient educated on when it is appropriate to go to the emergency department.   Time call ended:  10:25AM  I provided 30 minutes of non-face-to-face time during this encounter.    [name], FNP

## 2019-02-25 ENCOUNTER — Telehealth: Payer: Self-pay | Admitting: Family

## 2019-02-25 NOTE — Telephone Encounter (Signed)
Patient would like to get your opinion on her receiving the Covid vaccine.  She is concerned because she has had an anaphylactic reaction to a vaccine in the past.  She also has atrial fib so she is concerned if she were to have a reaction the Epi pen would cause problems with that.

## 2019-02-26 NOTE — Telephone Encounter (Signed)
Patient aware.

## 2019-02-26 NOTE — Telephone Encounter (Signed)
I would recommend holding off on her vaccine given her extreme sensitivity to medications. She may would be safe, but since she has a hx of anaphylactic reaction.   CDC considers a history of the following to be a contraindication to vaccination with both the Pfizer-BioNTech and Moderna COVID-19 vaccines:  Severe allergic reaction (e.g., anaphylaxis) after a previous dose of an mRNA COVID-19 vaccine or any of its components Immediate allergic reaction of any severity to a previous dose of an mRNA COVID-19 vaccine or any of its components (including polyethylene glycol [PEG])* Immediate allergic reaction of any severity to polysorbate (due to potential cross-reactive hypersensitivity with the vaccine ingredient PEG)

## 2019-03-01 DIAGNOSIS — R001 Bradycardia, unspecified: Secondary | ICD-10-CM | POA: Diagnosis not present

## 2019-03-03 ENCOUNTER — Other Ambulatory Visit: Payer: Self-pay

## 2019-03-03 ENCOUNTER — Other Ambulatory Visit: Payer: Medicare Other

## 2019-03-03 DIAGNOSIS — K219 Gastro-esophageal reflux disease without esophagitis: Secondary | ICD-10-CM | POA: Diagnosis not present

## 2019-03-03 DIAGNOSIS — F321 Major depressive disorder, single episode, moderate: Secondary | ICD-10-CM | POA: Diagnosis not present

## 2019-03-03 DIAGNOSIS — M797 Fibromyalgia: Secondary | ICD-10-CM | POA: Diagnosis not present

## 2019-03-03 DIAGNOSIS — J4540 Moderate persistent asthma, uncomplicated: Secondary | ICD-10-CM | POA: Diagnosis not present

## 2019-03-03 DIAGNOSIS — E785 Hyperlipidemia, unspecified: Secondary | ICD-10-CM | POA: Diagnosis not present

## 2019-03-03 DIAGNOSIS — K589 Irritable bowel syndrome without diarrhea: Secondary | ICD-10-CM | POA: Diagnosis not present

## 2019-03-03 DIAGNOSIS — E039 Hypothyroidism, unspecified: Secondary | ICD-10-CM | POA: Diagnosis not present

## 2019-03-03 DIAGNOSIS — R531 Weakness: Secondary | ICD-10-CM | POA: Diagnosis not present

## 2019-03-03 DIAGNOSIS — I10 Essential (primary) hypertension: Secondary | ICD-10-CM | POA: Diagnosis not present

## 2019-03-03 DIAGNOSIS — R5383 Other fatigue: Secondary | ICD-10-CM | POA: Diagnosis not present

## 2019-03-03 DIAGNOSIS — R5381 Other malaise: Secondary | ICD-10-CM | POA: Diagnosis not present

## 2019-03-03 DIAGNOSIS — M3500 Sicca syndrome, unspecified: Secondary | ICD-10-CM | POA: Diagnosis not present

## 2019-03-03 LAB — CMP14+EGFR
ALT: 31 IU/L (ref 0–32)
AST: 17 IU/L (ref 0–40)
Albumin/Globulin Ratio: 1.9 (ref 1.2–2.2)
Albumin: 4.2 g/dL (ref 3.7–4.7)
Alkaline Phosphatase: 99 IU/L (ref 39–117)
BUN/Creatinine Ratio: 14 (ref 12–28)
BUN: 13 mg/dL (ref 8–27)
Bilirubin Total: 1 mg/dL (ref 0.0–1.2)
CO2: 26 mmol/L (ref 20–29)
Calcium: 9.6 mg/dL (ref 8.7–10.3)
Chloride: 100 mmol/L (ref 96–106)
Creatinine, Ser: 0.91 mg/dL (ref 0.57–1.00)
GFR calc Af Amer: 69 mL/min/{1.73_m2} (ref >59)
GFR calc non Af Amer: 60 mL/min/{1.73_m2} (ref >59)
Globulin, Total: 2.2 g/dL (ref 1.5–4.5)
Glucose: 100 mg/dL — ABNORMAL HIGH (ref 65–99)
Potassium: 3.6 mmol/L (ref 3.5–5.2)
Sodium: 142 mmol/L (ref 134–144)
Total Protein: 6.4 g/dL (ref 6.0–8.5)

## 2019-03-03 LAB — LIPID PANEL
Chol/HDL Ratio: 2.2 ratio (ref 0.0–4.4)
Cholesterol, Total: 152 mg/dL (ref 100–199)
HDL: 69 mg/dL (ref >39)
LDL Chol Calc (NIH): 68 mg/dL (ref 0–99)
Triglycerides: 79 mg/dL (ref 0–149)
VLDL Cholesterol Cal: 15 mg/dL (ref 5–40)

## 2019-03-03 LAB — CBC WITH DIFFERENTIAL/PLATELET
Basophils Absolute: 0 10*3/uL (ref 0.0–0.2)
Basos: 0 %
EOS (ABSOLUTE): 0 10*3/uL (ref 0.0–0.4)
Eos: 0 %
Hematocrit: 41.3 % (ref 34.0–46.6)
Hemoglobin: 14 g/dL (ref 11.1–15.9)
Immature Grans (Abs): 0 10*3/uL (ref 0.0–0.1)
Immature Granulocytes: 0 %
Lymphocytes Absolute: 1.1 10*3/uL (ref 0.7–3.1)
Lymphs: 11 %
MCH: 30.7 pg (ref 26.6–33.0)
MCHC: 33.9 g/dL (ref 31.5–35.7)
MCV: 91 fL (ref 79–97)
Monocytes Absolute: 0.7 10*3/uL (ref 0.1–0.9)
Monocytes: 7 %
Neutrophils Absolute: 8.2 10*3/uL — ABNORMAL HIGH (ref 1.4–7.0)
Neutrophils: 82 %
Platelets: 173 10*3/uL (ref 150–450)
RBC: 4.56 x10E6/uL (ref 3.77–5.28)
RDW: 12.6 % (ref 11.7–15.4)
WBC: 10.1 10*3/uL (ref 3.4–10.8)

## 2019-03-03 LAB — TSH: TSH: 2.76 u[IU]/mL (ref 0.450–4.500)

## 2019-03-16 DIAGNOSIS — I4821 Permanent atrial fibrillation: Secondary | ICD-10-CM | POA: Diagnosis not present

## 2019-03-16 DIAGNOSIS — Z7901 Long term (current) use of anticoagulants: Secondary | ICD-10-CM | POA: Diagnosis not present

## 2019-03-23 ENCOUNTER — Telehealth: Payer: Self-pay | Admitting: *Deleted

## 2019-03-23 DIAGNOSIS — I4821 Permanent atrial fibrillation: Secondary | ICD-10-CM

## 2019-03-23 NOTE — Telephone Encounter (Signed)
INR was 3.1 today (goal is 2.0 to 3.0) Too thin  Hold today's dose 03/23/19 then Continue current schedule of warfarin taking it the following way:  4 mg Monday, Wednesday, and Friday, 2 mg all other days.  Recheck in 1 week

## 2019-03-23 NOTE — Telephone Encounter (Signed)
Patient aware.

## 2019-03-23 NOTE — Telephone Encounter (Signed)
Fax received mdINR PT/INR self testing service Test date/time 03/23/19 9:51 am INR 3.1

## 2019-03-23 NOTE — Telephone Encounter (Signed)
Just hold todays dose and then continue current dose.

## 2019-03-23 NOTE — Telephone Encounter (Signed)
Patient aware and verbalized understanding. Patient states it was high because she only ate a little bit of salad. Should she take 4 mg on Saturdays as well. And still skip tonight's dose or eat a salad.

## 2019-04-13 DIAGNOSIS — I4821 Permanent atrial fibrillation: Secondary | ICD-10-CM | POA: Diagnosis not present

## 2019-04-13 DIAGNOSIS — Z7901 Long term (current) use of anticoagulants: Secondary | ICD-10-CM | POA: Diagnosis not present

## 2019-04-20 ENCOUNTER — Telehealth: Payer: Self-pay | Admitting: *Deleted

## 2019-04-20 DIAGNOSIS — I4821 Permanent atrial fibrillation: Secondary | ICD-10-CM

## 2019-04-20 NOTE — Telephone Encounter (Signed)
Fax received mdINR PT/INR self testing service Test date/time 04/20/19 8:03 am INR 3.1

## 2019-04-20 NOTE — Telephone Encounter (Signed)
Patient wants to know if she can just eat more salad. Hold tonight and do usual dosing is 4mg  on Monday, Wndsenday, Friday and Saturday. 2mg  on Thursday and Sunday and hold tonight dose? Please advise.

## 2019-04-20 NOTE — Telephone Encounter (Signed)
INR was 3.1 today (goal is 2.0 to 3.0) Too thin  Hold today's dose 04/20/19 then decrease to 2 mg on Sunday, Tuesday, Wednesday,  Thursday and Saturday. Then  4 mg on  Monday and Friday.

## 2019-04-21 DIAGNOSIS — M79643 Pain in unspecified hand: Secondary | ICD-10-CM | POA: Diagnosis not present

## 2019-04-21 DIAGNOSIS — M25562 Pain in left knee: Secondary | ICD-10-CM | POA: Diagnosis not present

## 2019-04-21 DIAGNOSIS — I7300 Raynaud's syndrome without gangrene: Secondary | ICD-10-CM | POA: Diagnosis not present

## 2019-04-21 DIAGNOSIS — M797 Fibromyalgia: Secondary | ICD-10-CM | POA: Diagnosis not present

## 2019-04-21 DIAGNOSIS — M2550 Pain in unspecified joint: Secondary | ICD-10-CM | POA: Diagnosis not present

## 2019-04-21 DIAGNOSIS — M1180 Other specified crystal arthropathies, unspecified site: Secondary | ICD-10-CM | POA: Diagnosis not present

## 2019-04-21 DIAGNOSIS — R5382 Chronic fatigue, unspecified: Secondary | ICD-10-CM | POA: Diagnosis not present

## 2019-04-21 DIAGNOSIS — M3500 Sicca syndrome, unspecified: Secondary | ICD-10-CM | POA: Diagnosis not present

## 2019-04-21 DIAGNOSIS — M1990 Unspecified osteoarthritis, unspecified site: Secondary | ICD-10-CM | POA: Diagnosis not present

## 2019-04-21 DIAGNOSIS — M130 Polyarthritis, unspecified: Secondary | ICD-10-CM | POA: Diagnosis not present

## 2019-04-21 NOTE — Telephone Encounter (Signed)
This is fine 

## 2019-04-21 NOTE — Telephone Encounter (Signed)
Aware. 

## 2019-04-30 ENCOUNTER — Other Ambulatory Visit: Payer: Self-pay | Admitting: Allergy & Immunology

## 2019-05-04 ENCOUNTER — Ambulatory Visit (INDEPENDENT_AMBULATORY_CARE_PROVIDER_SITE_OTHER): Payer: Medicare Other | Admitting: Family

## 2019-05-04 ENCOUNTER — Encounter: Payer: Self-pay | Admitting: Family

## 2019-05-04 ENCOUNTER — Telehealth: Payer: Self-pay | Admitting: *Deleted

## 2019-05-04 DIAGNOSIS — F321 Major depressive disorder, single episode, moderate: Secondary | ICD-10-CM

## 2019-05-04 DIAGNOSIS — R5383 Other fatigue: Secondary | ICD-10-CM | POA: Diagnosis not present

## 2019-05-04 DIAGNOSIS — I4821 Permanent atrial fibrillation: Secondary | ICD-10-CM

## 2019-05-04 DIAGNOSIS — R5381 Other malaise: Secondary | ICD-10-CM | POA: Diagnosis not present

## 2019-05-04 DIAGNOSIS — Z5181 Encounter for therapeutic drug level monitoring: Secondary | ICD-10-CM

## 2019-05-04 DIAGNOSIS — M797 Fibromyalgia: Secondary | ICD-10-CM

## 2019-05-04 DIAGNOSIS — R531 Weakness: Secondary | ICD-10-CM

## 2019-05-04 DIAGNOSIS — M0579 Rheumatoid arthritis with rheumatoid factor of multiple sites without organ or systems involvement: Secondary | ICD-10-CM

## 2019-05-04 DIAGNOSIS — Z7901 Long term (current) use of anticoagulants: Secondary | ICD-10-CM

## 2019-05-04 DIAGNOSIS — M1990 Unspecified osteoarthritis, unspecified site: Secondary | ICD-10-CM

## 2019-05-04 DIAGNOSIS — M069 Rheumatoid arthritis, unspecified: Secondary | ICD-10-CM | POA: Insufficient documentation

## 2019-05-04 MED ORDER — BACLOFEN 5 MG PO TABS: 5.0000 mg | ORAL_TABLET | Freq: Three times a day (TID) | ORAL | 2 refills | Status: DC | PRN

## 2019-05-04 MED ORDER — CYANOCOBALAMIN 500 MCG PO TABS: 500.0000 ug | ORAL_TABLET | Freq: Every day | ORAL | 2 refills | Status: DC

## 2019-05-04 NOTE — Progress Notes (Signed)
Virtual Visit via telephone Note Due to COVID-19 pandemic this visit was conducted virtually. This visit type was conducted due to national recommendations for restrictions regarding the COVID-19 Pandemic (e.g. social distancing, sheltering in place) in an effort to limit this patient's exposure and mitigate transmission in our community. All issues noted in this document were discussed and addressed.  A physical exam was not performed with this format.  I connected with Angela Wong on 05/04/19 at 8:40 AM by telephone and verified that I am speaking with the correct person using two identifiers. Angela Wong is currently located at home and no one is currently with her during visit. The provider, [name], FNP is located in their office at time of visit.  I discussed the limitations, risks, security and privacy concerns of performing an evaluation and management service by telephone and the availability of in person appointments. I also discussed with the patient that there may be a patient responsible charge related to this service. The patient expressed understanding and agreed to proceed.   History and Present Illness:  HPI Pt calls the office today with multiple problems. She reports her RA is worse and has had multiple follow ups with her Rheumatologists. She was started on  Methotrexate 04/23/19 and has not felt any better. She reports her pain is a 10 out 10 and can not stand or walk because "my legs won't hold me".   She is taking warfarin for A Fib. Denies any missed doses, but was started on prednisone.  Her INR was 3.3 this morning. See anticoagulation flow sheet.   She is complaining of fatigue and weakness. She reports her anxiety and depression have worsen because she can't walk and do things for herself and her children are not helping.   Review of Systems  Constitutional: Positive for malaise/fatigue.  Musculoskeletal: Positive for joint pain and myalgias.  All  other systems reviewed and are negative.    Observations/Objective: No SOB or distress noted, tearful at times   Assessment and Plan: Angela Wong comes in today with chief complaint of No chief complaint on file.   Diagnosis and orders addressed:  1. Atrial fibrillation, permanent ([place]) Description   INR was 3.3 today (goal is 2.0 to 3.0) Too thin  Hold today's dose 05/04/19 then decrease to 2 mg on Sunday, Tuesda,  Thursday and Saturday. Then  4 mg on  Monday, Wednesday,  and Friday.      2. Rheumatoid arthritis involving multiple sites with positive rheumatoid factor ([place])  3. Osteoarthritis, unspecified osteoarthritis type, unspecified site - Baclofen 5 MG TABS; Take 5 mg by mouth 3 (three) times daily as needed.  Dispense: 90 tablet; Refill: 2  4. Anticoagulation goal of INR 2 to 3  5. Fibromyalgia - Baclofen 5 MG TABS; Take 5 mg by mouth 3 (three) times daily as needed.  Dispense: 90 tablet; Refill: 2  6. Generalized weakness - Baclofen 5 MG TABS; Take 5 mg by mouth 3 (three) times daily as needed.  Dispense: 90 tablet; Refill: 2 - vitamin B-12 (CYANOCOBALAMIN) 500 MCG tablet; Take 1 tablet (500 mcg total) by mouth daily.  Dispense: 90 tablet; Refill: 2  7. Malaise and fatigue  8. Depression, major, single episode, moderate (HCC)    Follow up plan: 2 weeks      I discussed the assessment and treatment plan with the patient. The patient was provided an opportunity to ask questions and all were answered. The patient agreed with  the plan and demonstrated an understanding of the instructions.   The patient was advised to call back or seek an in-person evaluation if the symptoms worsen or if the condition fails to improve as anticipated.  The above assessment and management plan was discussed with the patient. The patient verbalized understanding of and has agreed to the management plan. Patient is aware to call the clinic if symptoms persist or worsen. Patient  is aware when to return to the clinic for a follow-up visit. Patient educated on when it is appropriate to go to the emergency department.   Time call ended:  9:05 AM  I provided 25 minutes of non-face-to-face time during this encounter.    [name], FNP

## 2019-05-04 NOTE — Telephone Encounter (Signed)
Fax received mdINR PT/INR self testing service Test date/time 05/04/19 938 am INR 3.3

## 2019-05-04 NOTE — Telephone Encounter (Signed)
Discussed at visit

## 2019-05-05 ENCOUNTER — Ambulatory Visit (INDEPENDENT_AMBULATORY_CARE_PROVIDER_SITE_OTHER): Payer: Medicare Other | Admitting: *Deleted

## 2019-05-05 DIAGNOSIS — R531 Weakness: Secondary | ICD-10-CM

## 2019-05-05 DIAGNOSIS — J4540 Moderate persistent asthma, uncomplicated: Secondary | ICD-10-CM | POA: Diagnosis not present

## 2019-05-05 DIAGNOSIS — M0579 Rheumatoid arthritis with rheumatoid factor of multiple sites without organ or systems involvement: Secondary | ICD-10-CM

## 2019-05-05 DIAGNOSIS — M797 Fibromyalgia: Secondary | ICD-10-CM

## 2019-05-05 NOTE — Chronic Care Management (AMB) (Addendum)
Chronic Care Management   Follow Up Note   05/05/2019 Name: Angela Wong MRN: [id number] DOB: 27-Jan-1940  Referred by: [name], FNP Reason for referral : Chronic Care Management (RN follow up)   Angela Wong is a 79 y.o. year old female who is a primary care patient of [name], FNP. The CCM team was consulted for assistance with chronic disease management and care coordination needs.    Review of patient status, including review of consultants reports, relevant laboratory and other test results, and collaboration with appropriate care team members and the patient's provider was performed as part of comprehensive patient evaluation and provision of chronic care management services.    SDOH (Social Determinants of Health) assessments performed: Yes See Care Plan activities for detailed interventions related to SDOH)  SDOH Interventions      Most Recent Value  SDOH Interventions  SDOH Interventions for the Following Domains  Transportation  Transportation Interventions  Other (Comment) [Referral to Care Guides to assist with transportation]        Outpatient Encounter Medications as of 05/05/2019  Medication Sig Note   albuterol (VENTOLIN HFA) 108 (90 Base) MCG/ACT inhaler INHALE 2 PUFFS BY MOUTH EVERY 6 HOURS AS NEEDED FOR WHEEZING    atenolol (TENORMIN) 25 MG tablet Take 12.5 mg by mouth daily. *May take additional 12.5mg  daily as needed for A-Fib    B Complex-C (B-COMPLEX WITH VITAMIN C) tablet Take 1 tablet by mouth every evening.     Baclofen 5 MG TABS Take 5 mg by mouth 3 (three) times daily as needed.    bumetanide (BUMEX) 0.5 MG tablet Take 1 tablet (0.5 mg total) by mouth every other day.    bumetanide (BUMEX) 1 MG tablet TAKE 1 TABLET BY MOUTH EVERY OTHER DAY ON OPPOSITE DAY OF 0.5MG  DOSE    Cholecalciferol (VITAMIN D) 2000 UNITS CAPS Take 2 capsules by mouth every evening.     diphenhydrAMINE (BENADRYL) 25 MG tablet Take by mouth.    docusate  sodium (COLACE) 100 MG capsule Take 400 mg by mouth daily.  03/19/2018: On hold due to recent prescription of Clindamycin prescribed on 03/16/2018   esomeprazole (NEXIUM) 40 MG capsule Take 1 capsule (40 mg total) by mouth 2 (two) times daily.    fexofenadine (ALLEGRA) 180 MG tablet Take 180 mg by mouth every morning.     Iron Polysacch Cmplx-B12-FA (POLY-IRON 150 FORTE) 150-0.025-1 MG CAPS Take 1 capsule by mouth daily.    levothyroxine (EUTHYROX) 50 MCG tablet Take 2 tablets (100 mcg total) by mouth every morning.    losartan (COZAAR) 100 MG tablet Take 1 tablet (100 mg total) by mouth daily.    Magnesium 200 MG TABS Take 400 mg by mouth every evening.     montelukast (SINGULAIR) 10 MG tablet Take 1 tablet (10 mg total) by mouth at bedtime.    Olopatadine HCl 0.2 % SOLN Apply 1 drop to eye daily as needed (for allergy eye).    predniSONE (DELTASONE) 5 MG tablet Take 5 mg by mouth every morning. 03/19/2018: HOLD while taking Prednisone taper of 10mg  started on 03/14/2018   PREVIDENT 5000 DRY MOUTH 1.1 % GEL dental gel Place 1 application onto teeth as needed (as needed for dry mouth).     RESTASIS 0.05 % ophthalmic emulsion INSTILL 1 DROP INTO EACH EYE TWICE DAILY    Simethicone (GAS-X EXTRA STRENGTH) 125 MG CAPS Take 2 capsules by mouth daily as needed (for relief).  SYMBICORT 160-4.5 MCG/ACT inhaler Inhale 2 puffs by mouth twice daily    traZODone (DESYREL) 150 MG tablet Take 1 tablet (150 mg total) by mouth at bedtime.    vitamin B-12 (CYANOCOBALAMIN) 500 MCG tablet Take 1 tablet (500 mcg total) by mouth daily.    warfarin (COUMADIN) 1 MG tablet TAKE 2 TO 4 TABLETS BY MOUTH ONCE DAILY AS DIRECTED BY  ANTICOAGULATION  CLINIC    No facility-administered encounter medications on file as of 05/05/2019.     RN Care Plan     "I need help with Transportation" (pt-stated)       CARE PLAN ENTRY (see longitudinal plan of care for additional care plan information)  Transportation needs in a patient  with Fibromyalgia, generalized weakness, Rheumatoid arthritis,osteoarthritis,Depression, Raynaud's disease, Sjogren's syndrome, afib, asthma, and hypothyroidism  Current Barriers:  Knowledge Deficits related to transportation assistance Lacks caregiver support.  Film/video editor.  Transportation barriers  Nurse Case Manager Clinical Goal(s):  Over the next 10 days, patient will talk with [place] regarding transportation assistance  Interventions:  Inter-disciplinary care team collaboration (see longitudinal plan of care) Chart reviewed including recent office notes and lab results Talked with patient by telephone Discussed transportation problems Discussed family involvement They assist with transportation when able but they aren't always available Discussed physical mobility limitations Discussed use of rolling walker with seat Discussed generalized weakness and patient's fear of falling Discussed upcoming appointments Referral to Care Guide for transportation assistance Provided patient with CCM contact information and encouraged to reach out as needed  Patient Self Care Activities:  Performs most ADLs independently with some assistance Unable to independently drive  Initial goal documentation      "I need some help at home"       [place] (see longitudinal plan of care for additional care plan information)  In-home care needs in a patient with Fibromyalgia, generalized weakness, Rheumatoid arthritis,osteoarthritis,Depression, Raynaud's disease, Sjogren's syndrome, afib, asthma, and hypothyroidism  Current Barriers:  Knowledge Deficits related to in home care options Financial Constraints.  Transportation barriers  Nurse Case Manager Clinical Goal(s):  Over the next 30 days, patient will talk with RN Care Manager regarding in home care options  Interventions:  Inter-disciplinary care team collaboration (see longitudinal plan of care) Chart reviewed  including office notes and referral notes Referral to Hospice for palliative care services in 2/21. Documented that she was enrolled in the Supportive Care Program Patient states that she was denied "hospice" services because she had more than 6 months to live. She wasn't familiar with Supportive Care Serivces Reached out to [facility] at [phone] Left message for Beth with the Supportive Care Program to give me a call back Discussed private pay in-home care options Discussed patient's need for help vs her concern about letting people into her home because of Covid Discussed limitations to performing ADLs independently Uses a walker but is concerned about leg weakness and falling Worries about stepping in and out of the shower. Has a walkin shower with a shower seat. Does not have a handheld shower head.  Typically showers when he granddaughter is there so that she can help her in and out Provided with CCM contact information and encouraged to reach out as needed  Patient Self Care Activities:  Performs ADLs with some assistance Unable to independently drive  Initial goal documentation          Plan:   The care management team will reach out to the patient  again over the next 15 days.   [name], BSN, RN-BC Embedded Chronic Care Manager [facility] [place] [facility] Direct Dial: [phone]    I have reviewed and agree with the above  documentation.   [name], FNP

## 2019-05-05 NOTE — Patient Instructions (Signed)
Visit Information  Goals Addressed            This Visit's Progress     Patient Stated   . "I need help with Transportation" (pt-stated)       [place] (see longitudinal plan of care for additional care plan information) Transportation assistance needs in a patient with Fibromyalgia, generalized weakness, Rheumatoid arthritis,osteoarthritis,Depression, Raynaud's disease, Sjogren's syndrome, afib, asthma, and hypothyroidism  Current Barriers:  Marland Kitchen Knowledge Deficits related to transportation assistance . Lacks caregiver support.  . Film/video editor.  . Transportation barriers  Nurse Case Manager Clinical Goal(s):  Marland Kitchen Over the next 10 days, patient will talk with [place] regarding transportation assistance  Interventions:  . Inter-disciplinary care team collaboration (see longitudinal plan of care) . Chart reviewed including recent office notes and lab results . Talked with patient by telephone . Discussed transportation problems . Discussed family involvement o They assist with transportation when able but they aren't always available . Discussed physical mobility limitations . Discussed use of rolling walker with seat . Discussed generalized weakness and patient's fear of falling . Discussed upcoming appointments . Referral to Care Guide for transportation assistance . Provided patient with CCM contact information and encouraged to reach out as needed  Patient Self Care Activities:  . Performs most ADLs independently with some assistance . Unable to independently drive  Initial goal documentation       Other   . "I need some help at home"       [place] (see longitudinal plan of care for additional care plan information)  In-home care needs in a patient with Fibromyalgia, generalized weakness, Rheumatoid arthritis,osteoarthritis,Depression, Raynaud's disease, Sjogren's syndrome, afib, asthma, and hypothyroidism  Current Barriers:   Marland Kitchen Knowledge Deficits related to in home care options . Film/video editor.  . Transportation barriers  Nurse Case Manager Clinical Goal(s):  Marland Kitchen Over the next 30 days, patient will talk with RN Care Manager regarding in home care options  Interventions:  . Inter-disciplinary care team collaboration (see longitudinal plan of care) . Chart reviewed including office notes and referral notes o Referral to Hospice for palliative care services in 2/21. Documented that she was enrolled in the Supportive Care Program . Patient states that she was denied "hospice" services because she had more than 6 months to live. She wasn't familiar with Supportive Care Serivces . Reached out to [facility] at [phone] o Left message for Eustaquio Maize with the Supportive Care Program to give me a call back . Discussed private pay in-home care options . Discussed patient's need for help vs her concern about letting people into her home because of Covid . Discussed limitations to performing ADLs independently o Uses a walker but is concerned about leg weakness and falling o Worries about stepping in and out of the shower. Has a walkin shower with a shower seat. Does not have a handheld shower head.  o Typically showers when he granddaughter is there so that she can help her in and out . Provided with CCM contact information and encouraged to reach out as needed  Patient Self Care Activities:  . Performs ADLs with some assistance . Unable to independently drive  Initial goal documentation        The patient verbalized understanding of instructions provided today and declined a print copy of patient instruction materials.   Follow-up Plan The care management team will reach out to the patient again over the next 15 days.  [name], BSN, RN-BC Embedded Chronic Care Manager [facility] [place] [facility] Direct Dial: [phone]

## 2019-05-07 ENCOUNTER — Other Ambulatory Visit: Payer: Self-pay

## 2019-05-07 ENCOUNTER — Encounter: Payer: Self-pay | Admitting: Allergy & Immunology

## 2019-05-07 ENCOUNTER — Ambulatory Visit (INDEPENDENT_AMBULATORY_CARE_PROVIDER_SITE_OTHER): Payer: Medicare Other | Admitting: Allergy & Immunology

## 2019-05-07 DIAGNOSIS — J4540 Moderate persistent asthma, uncomplicated: Secondary | ICD-10-CM | POA: Diagnosis not present

## 2019-05-07 DIAGNOSIS — J302 Other seasonal allergic rhinitis: Secondary | ICD-10-CM | POA: Diagnosis not present

## 2019-05-07 DIAGNOSIS — Z881 Allergy status to other antibiotic agents status: Secondary | ICD-10-CM

## 2019-05-07 DIAGNOSIS — J3089 Other allergic rhinitis: Secondary | ICD-10-CM | POA: Diagnosis not present

## 2019-05-07 DIAGNOSIS — M3500 Sicca syndrome, unspecified: Secondary | ICD-10-CM

## 2019-05-07 NOTE — Patient Instructions (Signed)
1. Seasonal and perennial allergic rhinitis -  - Use the cetirizine 5mg  (half of a 10mg  tablet) daily to help with the allergic symptoms. - This might help prevent any reactions to the mold at your house. - This will help with the itching as well.  - Continue with nasal saline rinses as tolerated, but you can also try nasal saline gel ([place]).  - Information on Xolair will be mailed. - Consider adding this on as an agent to help prevent/decrease the allergic reactions.  - I will have Tammy reach out to discuss this with you.   2. Moderate persistent asthma, uncomplicated - Lung function deferred today since this was a telephone visit.  - We are not going to make any changes since your breathing is actually working.  - Daily controller medication(s): Singulair 10mg  daily and Symbicort 160/4.12mcg two puffs twice daily with spacer - Prior to physical activity: ProAir 2 puffs 10-15 minutes before physical activity. - Rescue medications: ProAir 4 puffs every 4-6 hours as needed - Asthma control goals:  * Full participation in all desired activities (may need albuterol before activity) * Albuterol use two time or less a week on average (not counting use with activity) * Cough interfering with sleep two time or less a month * Oral steroids no more than once a year * No hospitalizations  3. Itching - I think the cetirizine will help to control the itching. - The addition of the Xolair could help with the itching.   4. No follow-ups on file. This can be an in-person, a virtual Webex or a telephone follow up visit.   Please inform us of any Emergency Department visits, hospitalizations, or changes in symptoms. Call us before going to the ED for breathing or allergy symptoms since we might be able to fit you in for a sick visit. Feel free to contact us anytime with any questions, problems, or concerns.  It was a pleasure to talk to you today today!  Websites that have reliable patient  information: 1. American Academy of Asthma, Allergy, and Immunology: www.aaaai.org 2. Food Allergy Research and Education (FARE): foodallergy.org 3. Mothers of Asthmatics: http://www.asthmacommunitynetwork.org 4. American College of Allergy, Asthma, and Immunology: www.acaai.org   COVID-19 Vaccine Information can be found at: ShippingScam.co.uk For questions related to vaccine distribution or appointments, please email [email or web address] or call [phone].     "Like" [place] on Facebook and Instagram for our latest updates!       HAPPY SPRING!  Make sure you are registered to vote! If you have moved or changed any of your contact information, you will need to get this updated before voting!  In some cases, you MAY be able to register to vote online: [email or web address]

## 2019-05-07 NOTE — Progress Notes (Signed)
RE: Angela Wong MRN: [id number] DOB: 06-Sep-1940 Date of Telemedicine Visit: 05/07/2019  Referring provider: [name], FNP Primary care provider: [name], FNP  Chief Complaint: No chief complaint on file.   Telemedicine Follow Up Visit via Telephone: I connected with Angela Wong for a follow up on 05/07/19 by telephone and verified that I am speaking with the correct person using two identifiers.   I discussed the limitations, risks, security and privacy concerns of performing an evaluation and management service by telephone and the availability of in person appointments. I also discussed with the patient that there may be a patient responsible charge related to this service. The patient expressed understanding and agreed to proceed.  Patient is at home.  Provider is at the office.  Visit start time: 11:24 AM Visit end time: 12:10 AM Insurance consent/check in by: [facility] Medical consent and medical assistant/nurse: Olivia Mackie  History of Present Illness:  She is a 79 y.o. female, who is being followed for a multitude of complaints, but predominantly in this clinic, she is followed for her asthma, perennial and seasonal allergic rhinitis, and chronic sinusitis as well as chronic urticaria.  She also has a history of Sjogren's syndrome and polypharmacy and fibromyalgia. Her previous allergy office visit was in October 2020 with myself.  She was last seen via televisit in October 2020.  At that time, she had finished 2 rounds of azithromycin with continued sinus drainage.  We went ahead and commence her to do a third round of azithromycin which seems to have worked.  She was very chatty at the last visit and I felt that depression was contributing to a lot of her symptoms.   Since the last visit, she has mostly done well, at least from our perspective of the diseases that we manage.  Asthma/Respiratory Symptom History: She reports that her breathing is not too bad. She does  get short of breath with some activity, but she is not getting around very well due to joint pain. She is having to hold herself with her arms which is making the resrt of her muscles weaker. ACT score is subpar at 17.  She has not needed any systemic steroids at all.  Allergic Rhinitis Symptom History: She has not needed antibiotics since last visit for sinusitis.  She is using her nasal saline pretty regularly.  She has not tolerated antihistamines in the past because of overdrying.  Her autoimmune issues and fibromyalgia continues to be a problem.  She was placed on MTX and a prednisone taper by her Rheumatologist (Dr. Kathlene November). She is on folic acid as well as vitamin B12. She talked to MS. Hawkes and she was placed on baclofen 5mg ; she has been using only half of a dose. When she took it this morning, she had some throat itching. She thinks this might be related to her dog.   She is up for her Tetanus booster, but she had a reaction to a Tetanus booster. She is worried about getting the COVID19 vaccine.  She has a list of 24 different medication allergies.  She is interested in testing if we could offer it to her.  She knows she has high risk of contracting COVID-19 and having bad outcomes, and she is being proactive about making sure that she can get the vaccine.  Otherwise, there have been no changes to her past medical history, surgical history, family history, or social history.  Assessment and Plan:  [name] is a 79  y.o. female with:  Moderate persistent asthma without complication  Seasonal and perennial allergic rhinitis(molds, ragweed, and cat)  Chronic sinusitis  Chronic urticaria - unknown trigger  Sjogren's syndrome  Multiple medication/environmental allergens   [name] is always has a Economist of complaints.  Most of these are unrelated to asthma and allergy, but I do spend a lot of time listening to her.  We are going to do COVID-19 vaccine component testing out  of an abundance of caution given her reaction to tetanus in the past as well as her multiple medication allergies.  I did describe the process and emphasized that it could take upwards of 4 hours.  She is open to staying in the office that long.  We will get her scheduled for this testing and then make further plans based on the results.  I anticipate that they will be normal.  Diagnostics: None.  Medication List:  Current Outpatient Medications  Medication Sig Dispense Refill  . albuterol (VENTOLIN HFA) 108 (90 Base) MCG/ACT inhaler INHALE 2 PUFFS BY MOUTH EVERY 6 HOURS AS NEEDED FOR WHEEZING 9 g 0  . atenolol (TENORMIN) 25 MG tablet Take 12.5 mg by mouth daily. *May take additional 12.5mg  daily as needed for A-Fib  6  . B Complex-C (B-COMPLEX WITH VITAMIN C) tablet Take 1 tablet by mouth every evening.     . Baclofen 5 MG TABS Take 5 mg by mouth 3 (three) times daily as needed. 90 tablet 2  . bumetanide (BUMEX) 0.5 MG tablet Take 1 tablet (0.5 mg total) by mouth every other day. 90 tablet 1  . bumetanide (BUMEX) 1 MG tablet TAKE 1 TABLET BY MOUTH EVERY OTHER DAY ON OPPOSITE DAY OF 0.5MG  DOSE 45 tablet 2  . Cholecalciferol (VITAMIN D) 2000 UNITS CAPS Take 2 capsules by mouth every evening.     . diphenhydrAMINE (BENADRYL) 25 MG tablet Take by mouth.    . docusate sodium (COLACE) 100 MG capsule Take 400 mg by mouth daily.     Marland Kitchen esomeprazole (NEXIUM) 40 MG capsule Take 1 capsule (40 mg total) by mouth 2 (two) times daily. 180 capsule 2  . fexofenadine (ALLEGRA) 180 MG tablet Take 180 mg by mouth every morning.     . Iron Polysacch Cmplx-B12-FA (POLY-IRON 150 FORTE) 150-0.025-1 MG CAPS Take 1 capsule by mouth daily. 90 capsule 1  . levothyroxine (EUTHYROX) 50 MCG tablet Take 2 tablets (100 mcg total) by mouth every morning. 180 tablet 3  . losartan (COZAAR) 100 MG tablet Take 1 tablet (100 mg total) by mouth daily. 90 tablet 1  . Magnesium 200 MG TABS Take 400 mg by mouth every evening.     .  montelukast (SINGULAIR) 10 MG tablet Take 1 tablet (10 mg total) by mouth at bedtime. 90 tablet 3  . Olopatadine HCl 0.2 % SOLN Apply 1 drop to eye daily as needed (for allergy eye). 2.5 mL 5  . predniSONE (DELTASONE) 5 MG tablet Take 5 mg by mouth every morning.    Marland Kitchen PREVIDENT 5000 DRY MOUTH 1.1 % GEL dental gel Place 1 application onto teeth as needed (as needed for dry mouth).     . RESTASIS 0.05 % ophthalmic emulsion INSTILL 1 DROP INTO EACH EYE TWICE DAILY 120 each 0  . Simethicone (GAS-X EXTRA STRENGTH) 125 MG CAPS Take 2 capsules by mouth daily as needed (for relief).     . SYMBICORT 160-4.5 MCG/ACT inhaler Inhale 2 puffs by mouth twice daily 10.2 g  5  . traZODone (DESYREL) 150 MG tablet Take 1 tablet (150 mg total) by mouth at bedtime. 90 tablet 2  . vitamin B-12 (CYANOCOBALAMIN) 500 MCG tablet Take 1 tablet (500 mcg total) by mouth daily. 90 tablet 2  . warfarin (COUMADIN) 1 MG tablet TAKE 2 TO 4 TABLETS BY MOUTH ONCE DAILY AS DIRECTED BY  ANTICOAGULATION  CLINIC 350 tablet 0   No current facility-administered medications for this visit.   Allergies: Allergies  Allergen Reactions  . Cephalosporins Hives and Shortness Of Breath  . Ciprofloxacin Hives and Shortness Of Breath  . Diltiazem Shortness Of Breath    Swollen throat   . Doxycycline Hives and Shortness Of Breath  . Horse-Derived Products Anaphylaxis  . Ketek [Telithromycin] Palpitations    Chest discomfort  . Nitrofuran Derivatives Anaphylaxis and Hives    blisters  . Nitrous Oxide Nausea And Vomiting    Severe due to Sjogrens (Auto-Immune Disease)  . Other Anaphylaxis    ALLERGY TO HORSE SERUM  . Penicillins Hives and Shortness Of Breath    Did it involve swelling of the face/tongue/throat, SOB, or low BP? Yes Did it involve sudden or severe rash/hives, skin peeling, or any reaction on the inside of your mouth or nose? No Did you need to seek medical attention at a hospital or doctor's office? Unknown When did it  last happen?childhood If all above answers are "NO", may proceed with cephalosporin use.   Marland Kitchen Pentazocine Other (See Comments)    Other reaction(s): Mental Status Changes (intolerance) Altered Mental Status  Altered Mental Status  Altered Mental Status   . Sulfa Antibiotics Hives and Shortness Of Breath  . Trovan [Alatrofloxacin] Palpitations, Other (See Comments) and Anaphylaxis    Chest pain, dizziness, irregular pulse  . Amlodipine Swelling  . Calcium Channel Blockers     Respiratory distress  . Carvedilol Other (See Comments)    Dizziness, "joint pain, depression"  . Clindamycin/Lincomycin     CP, lock jaw  . Codeine Nausea Only  . Cymbalta [Duloxetine Hcl] Swelling  . Diovan [Valsartan] Swelling  . Lisinopril Swelling  . Sertraline Other (See Comments)    confusion  . Tramadol Nausea Only  . Loteprednol Etabonate Rash   I reviewed her past medical history, social history, family history, and environmental history and no significant changes have been reported from previous visits.  Review of Systems  Constitutional: Negative for activity change, appetite change, chills and diaphoresis.  HENT: Negative for congestion, postnasal drip, rhinorrhea, sinus pressure and sore throat.   Eyes: Negative for pain, discharge, redness and itching.  Respiratory: Negative for shortness of breath, wheezing and stridor.   Gastrointestinal: Negative for diarrhea, nausea and vomiting.  Endocrine: Negative for cold intolerance and heat intolerance.  Musculoskeletal: Negative for arthralgias, joint swelling and myalgias.  Skin: Negative for rash.  Allergic/Immunologic: Negative for environmental allergies and food allergies.    Objective:  Physical exam not obtained as encounter was done via telephone.   Previous notes and tests were reviewed.  I discussed the assessment and treatment plan with the patient. The patient was provided an opportunity to ask questions and all were answered.  The patient agreed with the plan and demonstrated an understanding of the instructions.   The patient was advised to call back or seek an in-person evaluation if the symptoms worsen or if the condition fails to improve as anticipated.  I provided 46 minutes of non-face-to-face time during this encounter.  It was my pleasure to participate  in [place]'s care today. Please feel free to contact me with any questions or concerns.   Sincerely,  Valentina Shaggy, MD

## 2019-05-10 ENCOUNTER — Encounter: Payer: Self-pay | Admitting: Allergy & Immunology

## 2019-05-11 DIAGNOSIS — I4821 Permanent atrial fibrillation: Secondary | ICD-10-CM | POA: Diagnosis not present

## 2019-05-11 DIAGNOSIS — Z7901 Long term (current) use of anticoagulants: Secondary | ICD-10-CM | POA: Diagnosis not present

## 2019-05-13 ENCOUNTER — Telehealth: Payer: Self-pay | Admitting: Family

## 2019-05-13 NOTE — Telephone Encounter (Signed)
  Community Resource Referral   [place] 05/13/2019  Name: Angela Wong   MRN: [id number]   DOB: 26-Jan-1940   AGE: 79 y.o.   GENDER: female   PCP [name], FNP.   Called pt regarding [employer] Referral for transportation. Pt stated that she cancelled her May 4th trip and will re-schedule it. She did need assistance with transportation for her 5/26 APPT in [place], discussed that her sons can sometimes help with trips to her appt but they both have health concerns. Referral placed to [facility]. Follow up on: 05/14/19 or once I hear back with confirmation.  [place] . [email or web address]  [phone]

## 2019-05-13 NOTE — Telephone Encounter (Signed)
  Community Resource Referral   [place] 05/13/2019   Name: Angela Wong   MRN: [id number]   DOB: May 18, 1940   AGE: 79 y.o.   GENDER: female   PCP [name], FNP.   Called pt regarding Community Resource Referral - let pt know that confirmation for trip will be available one week prior to visit. Follow up on:06/02/19   [place] . [email or web address]  [phone]

## 2019-05-13 NOTE — Telephone Encounter (Signed)
  Community Resource Referral   [place] 05/13/2019 1st Attempt on home and Cell  Name: Angela Wong   MRN: [id number]   DOB: [birth year]   AGE: 79 y.o.   GENDER: female   PCP [name], FNP.   Called pt regarding Community Resource Referral LMTCB Follow up on: 05/14/19  [place] . [email or web address]  [phone]

## 2019-05-14 ENCOUNTER — Telehealth: Payer: Self-pay | Admitting: *Deleted

## 2019-05-14 ENCOUNTER — Ambulatory Visit: Payer: Medicare Other | Admitting: *Deleted

## 2019-05-14 DIAGNOSIS — J4540 Moderate persistent asthma, uncomplicated: Secondary | ICD-10-CM | POA: Diagnosis not present

## 2019-05-14 DIAGNOSIS — M0579 Rheumatoid arthritis with rheumatoid factor of multiple sites without organ or systems involvement: Secondary | ICD-10-CM

## 2019-05-14 NOTE — Telephone Encounter (Signed)
Ok

## 2019-05-17 ENCOUNTER — Ambulatory Visit: Payer: Medicare Other | Admitting: *Deleted

## 2019-05-17 DIAGNOSIS — I420 Dilated cardiomyopathy: Secondary | ICD-10-CM

## 2019-05-17 DIAGNOSIS — I4821 Permanent atrial fibrillation: Secondary | ICD-10-CM

## 2019-05-17 DIAGNOSIS — M0579 Rheumatoid arthritis with rheumatoid factor of multiple sites without organ or systems involvement: Secondary | ICD-10-CM

## 2019-05-17 DIAGNOSIS — I341 Nonrheumatic mitral (valve) prolapse: Secondary | ICD-10-CM

## 2019-05-17 NOTE — Chronic Care Management (AMB) (Addendum)
  Chronic Care Management   Care Coordination Note   05/17/2019 Name: KELEY MOGHADAM MRN: [id number] DOB: May 26, 1940  Referred by: [name], FNP Reason for referral : Chronic Care Management (Care Coordination)   ILCE HAVINS is a 79 y.o. year old female who is a primary care patient of [name], FNP. The CCM team was consulted for assistance with chronic disease management and care coordination needs.     RN Care Plan     "I need some help at home"       CARE PLAN ENTRY (see longitudinal plan of care for additional care plan information)  In-home care needs in a patient with Fibromyalgia, generalized weakness, Rheumatoid arthritis,osteoarthritis,Depression, Raynaud's disease, Sjogren's syndrome, afib, asthma, and hypothyroidism  Current Barriers:  Knowledge Deficits related to in home care options Financial Constraints.  Transportation barriers  Nurse Case Manager Clinical Goal(s):  Over the next 30 days, patient will talk with RN Care Manager regarding in home care options  Interventions:  Inter-disciplinary care team collaboration (see longitudinal plan of care) Chart reviewed including office notes and referral notes Referral to Hospice for palliative care services in 2/21. Documented that she was enrolled in the Supportive Care Program Reached out to [facility] at [phone] Confirmed that patient has been enrolled in [facility]. I advised that the patient has been been contacted.  Discussed their Palliative program pilot. They advised to send an order for palliative care to [phone] and to include terminal dx of congestive dilated cardiomyopathy and that it is for symptom management Collaborated with PCP office to send referral to [facility] for Palliative Care Program Previously discussed private pay in-home care options Previously discussed limitations to performing ADLs independently Uses a walker but  is concerned about leg weakness and falling Worries about stepping in and out of the shower. Has a walkin shower with a shower seat. Does not have a handheld shower head.  Typically showers when he granddaughter is there so that she can help her in and out Would like some help with cleaning and food preparation. Granddaugter tries to help but isn't as efficient as Ms Monasmith would like Previously provided with CCM contact information and encouraged to reach out as needed  Patient Self Care Activities:  Performs ADLs with some assistance Unable to independently drive  Please see past updates related to this goal by clicking on the "Past Updates" button in the selected goal           Plan:   The care management team will reach out to the patient again over the next 30 days.    [name], BSN, RN-BC Embedded Chronic Care Manager [facility] Direct Dial: [phone]   I have reviewed and agree with the above  documentation.   [name], FNP

## 2019-05-17 NOTE — Chronic Care Management (AMB) (Addendum)
Chronic Care Management   Follow Up Note   05/14/19 Name: Angela Wong MRN: [id number] DOB: 09/13/1940  Referred by: [name], FNP Reason for referral : Chronic Care Management   Angela Wong is a 79 y.o. year old female who is a primary care patient of [name], FNP. The CCM team was consulted for assistance with chronic disease management and care coordination needs.    Review of patient status, including review of consultants reports, relevant laboratory and other test results, and collaboration with appropriate care team members and the patient's provider was performed as part of comprehensive patient evaluation and provision of chronic care management services.    SDOH (Social Determinants of Health) assessments performed: Yes See Care Plan activities for detailed interventions related to [place])     I talked with patient by phone and spent a significant amount of time discussing her physical limitations and needs.  Outpatient Encounter Medications as of 05/14/2019  Medication Sig Note   albuterol (VENTOLIN HFA) 108 (90 Base) MCG/ACT inhaler INHALE 2 PUFFS BY MOUTH EVERY 6 HOURS AS NEEDED FOR WHEEZING    atenolol (TENORMIN) 25 MG tablet Take 12.5 mg by mouth daily. *May take additional 12.5mg  daily as needed for A-Fib    B Complex-C (B-COMPLEX WITH VITAMIN C) tablet Take 1 tablet by mouth every evening.     Baclofen 5 MG TABS Take 5 mg by mouth 3 (three) times daily as needed.    bumetanide (BUMEX) 0.5 MG tablet Take 1 tablet (0.5 mg total) by mouth every other day.    bumetanide (BUMEX) 1 MG tablet TAKE 1 TABLET BY MOUTH EVERY OTHER DAY ON OPPOSITE DAY OF 0.5MG  DOSE    Cholecalciferol (VITAMIN D) 2000 UNITS CAPS Take 2 capsules by mouth every evening.     diphenhydrAMINE (BENADRYL) 25 MG tablet Take by mouth.    docusate sodium (COLACE) 100 MG capsule Take 400 mg by mouth daily.  03/19/2018: On hold due to recent prescription of Clindamycin prescribed on  03/16/2018   esomeprazole (NEXIUM) 40 MG capsule Take 1 capsule (40 mg total) by mouth 2 (two) times daily.    fexofenadine (ALLEGRA) 180 MG tablet Take 180 mg by mouth every morning.     Iron Polysacch Cmplx-B12-FA (POLY-IRON 150 FORTE) 150-0.025-1 MG CAPS Take 1 capsule by mouth daily.    levothyroxine (EUTHYROX) 50 MCG tablet Take 2 tablets (100 mcg total) by mouth every morning.    losartan (COZAAR) 100 MG tablet Take 1 tablet (100 mg total) by mouth daily.    Magnesium 200 MG TABS Take 400 mg by mouth every evening.     montelukast (SINGULAIR) 10 MG tablet Take 1 tablet (10 mg total) by mouth at bedtime.    Olopatadine HCl 0.2 % SOLN Apply 1 drop to eye daily as needed (for allergy eye).    predniSONE (DELTASONE) 5 MG tablet Take 5 mg by mouth every morning. 03/19/2018: HOLD while taking Prednisone taper of 10mg  started on 03/14/2018   PREVIDENT 5000 DRY MOUTH 1.1 % GEL dental gel Place 1 application onto teeth as needed (as needed for dry mouth).     RESTASIS 0.05 % ophthalmic emulsion INSTILL 1 DROP INTO EACH EYE TWICE DAILY    Simethicone (GAS-X EXTRA STRENGTH) 125 MG CAPS Take 2 capsules by mouth daily as needed (for relief).     SYMBICORT 160-4.5 MCG/ACT inhaler Inhale 2 puffs by mouth twice daily    traZODone (DESYREL) 150 MG tablet Take 1  tablet (150 mg total) by mouth at bedtime.    vitamin B-12 (CYANOCOBALAMIN) 500 MCG tablet Take 1 tablet (500 mcg total) by mouth daily.    warfarin (COUMADIN) 1 MG tablet TAKE 2 TO 4 TABLETS BY MOUTH ONCE DAILY AS DIRECTED BY  ANTICOAGULATION  CLINIC    No facility-administered encounter medications on file as of 05/14/2019.     RN Care Plan     "I need help with Transportation" (pt-stated)       CARE PLAN ENTRY (see longitudinal plan of care for additional care plan information) Transportation assistance needs in a patient with Fibromyalgia, generalized weakness, Rheumatoid arthritis,osteoarthritis,Depression, Raynaud's disease, Sjogren's  syndrome, afib, asthma, and hypothyroidism  Current Barriers:  Knowledge Deficits related to transportation assistance Lacks caregiver support.  Film/video editor.  Transportation barriers  Nurse Case Manager Clinical Goal(s):  Over the next 10 days, patient will talk with [place] regarding transportation assistance  Interventions:  Inter-disciplinary care team collaboration (see longitudinal plan of care) Chart reviewed including recent office notes and lab results Talked with patient by telephone Discussed transportation problems Discussed family involvement They assist with transportation when able but they aren't always available Discussed physical mobility limitations Discussed use of rolling walker with seat Discussed generalized weakness and patient's fear of falling Discussed upcoming appointments Prior referral to Care Guide for transportation assistance They have talked with patient and have provided information on transportation assistance See care guide notes Provided patient with CCM contact information and encouraged to reach out as needed  Patient Self Care Activities:  Performs most ADLs independently with some assistance Unable to independently drive  Please see past updates related to this goal by clicking on the "Past Updates" button in the selected goal       "I need to schedule a mammogram" (pt-stated)       CARE PLAN ENTRY (see longitudinal plan of care for additional care plan information)  Current Barriers:  Care Coordination needs related to breast cancer screening in a patient with rheumatoid arthritis and a history of abnormal mammogram (disease states) Transportation barriers  Nurse Case Manager Clinical Goal(s):  Over the next 10 days, patient will be scheduled for a bilateral diagnostic mammogram  Interventions:  Inter-disciplinary care team collaboration (see longitudinal plan of care) Chart reviewed including most recent  mammogram report Discussed that patient is overdue for a left diagnostic mammogram and is also due for a right screening Discussed current symptoms. Patient complains of some right breast pain. Communicated with PCP that patient needs a bilateral diagnostic mammogram ordered at [facility] to follow-up on left breast and to evaluate right breast pain Advised patient that she should receive a call within the next week to schedule that appointment Encouraged patient to reach out to PCP with any new or worsening symptoms Encouraged patient to reach out to PCP office if she does not get a call regarding the mammogram appointment Provided with CCM contact information and encouraged to reach out as needed  Patient Self Care Activities:  Performs ADL's independently Unable to independently drive  Initial goal documentation      "I need to see a podiatrist" (pt-stated)       [place] (see longitudinal plan of care for additional care plan information)  Current Barriers:  Care Coordination needs related to foot care in a patient with rheumatoid arthritis and fibromyalgia (disease states) Transportation barriers  Nurse Case Manager Clinical Goal(s):  Over the next 14 days, patient will have appointment  with podiatrist scheduled  Interventions:  Inter-disciplinary care team collaboration (see longitudinal plan of care) Chart reviewed Talked with patient by telephone Discussed desire for podiatry appointment and foot care She has thickened toenails and difficulty maintaining them due to arthritis Communicated need to PCP Advised patient to f/u with PCP office if she has not received a call to schedule the appointment within the next week Provided with CCM contact information and encouraged to reach out as needed  Patient Self Care Activities:  Performs ADL's independently Unable to independently drive  Initial goal documentation      "I need some help at home"       [place] (see longitudinal plan of care for additional care plan information)  In-home care needs in a patient with Fibromyalgia, generalized weakness, Rheumatoid arthritis,osteoarthritis,Depression, Raynaud's disease, Sjogren's syndrome, afib, asthma, and hypothyroidism  Current Barriers:  Knowledge Deficits related to in home care options Financial Constraints.  Transportation barriers  Nurse Case Manager Clinical Goal(s):  Over the next 30 days, patient will talk with RN Care Manager regarding in home care options  Interventions:  Inter-disciplinary care team collaboration (see longitudinal plan of care) Chart reviewed including office notes and referral notes Referral to Hospice for palliative care services in 2/21. Documented that she was enrolled in the Supportive Care Program Previously discussed that she was denied "hospice" services because she had more than 6 months to live. She wasn't familiar with Supportive Care Serivces Previously reached out to [facility] at [phone] Left message for Beth with the Supportive Care Program to give me a call back Awaiting response. Will reach out again over the next 5 days Discussed private pay in-home care options Discussed patient's need for help vs her concern about letting people into her home because of Covid Discussed limitations to performing ADLs independently Uses a walker but is concerned about leg weakness and falling Worries about stepping in and out of the shower. Has a walkin shower with a shower seat. Does not have a handheld shower head.  Typically showers when he granddaughter is there so that she can help her in and out Would like some help with cleaning and food preparation. Granddaugter tries to help but isn't as efficient as Ms Vaux would like Provided with CCM contact information and encouraged to reach out as needed  Patient Self Care Activities:  Performs ADLs with some assistance Unable to  independently drive  Please see past updates related to this goal by clicking on the "Past Updates" button in the selected goal           Plan:   The care management team will reach out to the patient again over the next 7 days.   [name], BSN, RN-BC Embedded Chronic Care Manager [facility] [place] Management Direct Dial: [phone]   I have reviewed and agree with the above  documentation.   [name], FNP

## 2019-05-17 NOTE — Patient Instructions (Signed)
Visit Information  Goals Addressed            This Visit's Progress     Patient Stated   . "I need help with Transportation" (pt-stated)       Angela Wong (see longitudinal plan of care for additional care plan information) Transportation assistance needs in a patient with Fibromyalgia, generalized weakness, Rheumatoid arthritis,osteoarthritis,Depression, Raynaud's disease, Sjogren's syndrome, afib, asthma, and hypothyroidism  Current Barriers:  Angela Wong Knowledge Deficits related to transportation assistance . Lacks caregiver support.  . Film/video editor.  . Transportation barriers  Nurse Case Manager Clinical Goal(s):  Angela Wong Over the next 10 days, patient will talk with Angela Wong regarding transportation assistance  Interventions:  . Inter-disciplinary care team collaboration (see longitudinal plan of care) . Chart reviewed including recent office notes and lab results . Talked with patient by telephone . Discussed transportation problems . Discussed family involvement o They assist with transportation when able but they aren't always available . Discussed physical mobility limitations . Discussed use of rolling walker with seat . Discussed generalized weakness and patient's fear of falling . Discussed upcoming appointments . Prior referral to Care Guide for transportation assistance o They have talked with patient and have provided information on transportation assistance o See care guide notes . Provided patient with CCM contact information and encouraged to reach out as needed  Patient Self Care Activities:  . Performs most ADLs independently with some assistance . Unable to independently drive  Please see past updates related to this goal by clicking on the "Past Updates" button in the selected goal      . "I need to schedule a mammogram" (pt-stated)       Angela Wong (see longitudinal plan of care for additional care plan information)  Current  Barriers:  . Care Coordination needs related to breast cancer screening in a patient with rheumatoid arthritis and a history of abnormal mammogram (disease states) . Transportation barriers  Nurse Case Manager Clinical Goal(s):  Angela Wong Over the next 10 days, patient will be scheduled for a bilateral diagnostic mammogram  Interventions:  . Inter-disciplinary care team collaboration (see longitudinal plan of care) . Chart reviewed including most recent mammogram report . Discussed that patient is overdue for a left diagnostic mammogram and is also due for a right screening . Discussed current symptoms. Patient complains of some right breast pain. . Communicated with PCP that patient needs a bilateral diagnostic mammogram ordered at [facility] to follow-up on left breast and to evaluate right breast pain . Advised patient that she should receive a call within the next week to schedule that appointment . Encouraged patient to reach out to PCP with any new or worsening symptoms . Encouraged patient to reach out to PCP office if she does not get a call regarding the mammogram appointment . Provided with CCM contact information and encouraged to reach out as needed  Patient Self Care Activities:  . Performs ADL's independently . Unable to independently drive  Initial goal documentation     . "I need to see a podiatrist" (pt-stated)       Angela Wong (see longitudinal plan of care for additional care plan information)  Current Barriers:  . Care Coordination needs related to foot care in a patient with rheumatoid arthritis and fibromyalgia (disease states) . Transportation barriers  Nurse Case Manager Clinical Goal(s):  Angela Wong Over the next 14 days, patient will have appointment with podiatrist scheduled  Interventions:  . Inter-disciplinary  care team collaboration (see longitudinal plan of care) . Chart reviewed . Talked with patient by telephone . Discussed desire for podiatry appointment  and foot care o She has thickened toenails and difficulty maintaining them due to arthritis . Communicated need to PCP . Advised patient to f/u with PCP office if she has not received a call to schedule the appointment within the next week . Provided with CCM contact information and encouraged to reach out as needed  Patient Self Care Activities:  . Performs ADL's independently . Unable to independently drive  Initial goal documentation       Other   . "I need some help at home"       Angela Wong (see longitudinal plan of care for additional care plan information)  In-home care needs in a patient with Fibromyalgia, generalized weakness, Rheumatoid arthritis,osteoarthritis,Depression, Raynaud's disease, Sjogren's syndrome, afib, asthma, and hypothyroidism  Current Barriers:  Angela Wong Knowledge Deficits related to in home care options . Film/video editor.  . Transportation barriers  Nurse Case Manager Clinical Goal(s):  Angela Wong Over the next 30 days, patient will talk with RN Care Manager regarding in home care options  Interventions:  . Inter-disciplinary care team collaboration (see longitudinal plan of care) . Chart reviewed including office notes and referral notes o Referral to Hospice for palliative care services in 2/21. Documented that she was enrolled in the Supportive Care Program . Previously discussed that she was denied "hospice" services because she had more than 6 months to live. She wasn't familiar with Supportive Care Serivces . Previously reached out to [facility] at [phone] o Left message for Beth with the Supportive Care Program to give me a call back o Awaiting response. Will reach out again over the next 5 days . Discussed private pay in-home care options . Discussed patient's need for help vs her concern about letting people into her home because of Covid . Discussed limitations to performing ADLs independently o Uses a walker but is  concerned about leg weakness and falling o Worries about stepping in and out of the shower. Has a walkin shower with a shower seat. Does not have a handheld shower head.  o Typically showers when he granddaughter is there so that she can help her in and out o Would like some help with cleaning and food preparation. Granddaugter tries to help but isn't as efficient as Ms Kriss would like . Provided with CCM contact information and encouraged to reach out as needed  Patient Self Care Activities:  . Performs ADLs with some assistance . Unable to independently drive  Please see past updates related to this goal by clicking on the "Past Updates" button in the selected goal         The patient verbalized understanding of instructions provided today and declined a print copy of patient instruction materials.   Follow-up Plan The care management team will reach out to the patient again over the next 7 days.   [name], BSN, RN-BC Embedded Chronic Care Manager [facility] [place] Management Direct Dial: [phone]

## 2019-05-17 NOTE — Telephone Encounter (Signed)
05/17/2019  I spoke with Angela Wong on 05/14/19. She is overdue for left diagnostic mammogram and right screening mammogram but is having some right breast pain. Can you order a bilateral diagnostic mammogram at [facility]?  She would also like a referral to Dr Irving Shows in [place] for thickened toenails and general foot care.   I will forward to PCP for review and orders.   [name], BSN, RN-BC Embedded Chronic Care Manager [facility] / [place] Management Direct Dial: [phone]

## 2019-05-17 NOTE — Patient Instructions (Signed)
Visit Information  Goals Addressed            This Visit's Progress   . 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  . Transportation barriers  Nurse Case Manager Clinical Goal(s):  Marland Kitchen Over the next 30 days, patient will talk with RN Care Manager regarding in home care options  Interventions:  . Inter-disciplinary care team collaboration (see longitudinal plan of care) . Chart reviewed including office notes and referral notes o Referral to Hospice for palliative care services in 2/21. Documented that she was enrolled in the Supportive Care Program . Reached out to [facility] at [phone] o Confirmed that patient has been enrolled in [place]. I advised that the patient has been been contacted.  o Discussed their Palliative program pilot. They advised to send an order for palliative care to [phone] and to include terminal dx of congestive dilated cardiomyopathy and that it is for symptom management . Collaborated with PCP office to send referral to [facility] for [place] . Previously discussed private pay in-home care options . Previously discussed limitations to performing ADLs independently o Uses a walker but is concerned about leg weakness and falling o Worries about stepping in and out of the shower. Has a walkin shower with a shower seat. Does not have a handheld shower head.  o Typically showers when he granddaughter is there so that she can help her in and out o Would like some help with cleaning and food preparation. Granddaugter tries to help but  isn't as efficient as Ms Botts would like . Previously provided with CCM contact information and encouraged to reach out as needed  Patient Self Care Activities:  . Performs ADLs with some assistance . Unable to independently drive  Please see past updates related to this goal by clicking on the "Past Updates" button in the selected goal         Follow-up Plan The care management team will reach out to the patient again over the next 30 days.   [name], BSN, RN-BC Embedded Chronic Care Manager [facility] / [place] [facility] Direct Dial: [phone]  s

## 2019-05-18 ENCOUNTER — Other Ambulatory Visit: Payer: Self-pay | Admitting: Family

## 2019-05-18 ENCOUNTER — Telehealth: Payer: Self-pay | Admitting: Family

## 2019-05-18 ENCOUNTER — Telehealth: Payer: Self-pay | Admitting: *Deleted

## 2019-05-18 DIAGNOSIS — N644 Mastodynia: Secondary | ICD-10-CM

## 2019-05-18 DIAGNOSIS — L602 Onychogryphosis: Secondary | ICD-10-CM

## 2019-05-18 DIAGNOSIS — I4821 Permanent atrial fibrillation: Secondary | ICD-10-CM

## 2019-05-18 NOTE — Progress Notes (Signed)
Diagnostic mammogram ordered. Referral to podiatry placed.

## 2019-05-18 NOTE — Telephone Encounter (Signed)
lmtcb

## 2019-05-18 NOTE — Telephone Encounter (Signed)
Patient aware of Coumadin dosing.

## 2019-05-18 NOTE — Telephone Encounter (Signed)
INR was 1. today (goal is 2.0 to 3.0) Too thick  Take mg 4 mg (2 tablets)  Today, 0/05/21 then continue 2 mg on Sunday, Tuesday,  Thursday and Saturday. Then  4 mg on  Monday, Wednesday,  and Friday.

## 2019-05-18 NOTE — Telephone Encounter (Signed)
Fax received mdINR PT/INR self testing service Test date/time 05/18/19 920 am INR 1.9

## 2019-05-19 ENCOUNTER — Telehealth: Payer: Self-pay | Admitting: Family

## 2019-05-19 ENCOUNTER — Other Ambulatory Visit: Payer: Self-pay

## 2019-05-19 DIAGNOSIS — M79643 Pain in unspecified hand: Secondary | ICD-10-CM | POA: Diagnosis not present

## 2019-05-19 DIAGNOSIS — R5382 Chronic fatigue, unspecified: Secondary | ICD-10-CM | POA: Diagnosis not present

## 2019-05-19 DIAGNOSIS — M797 Fibromyalgia: Secondary | ICD-10-CM | POA: Diagnosis not present

## 2019-05-19 DIAGNOSIS — M1990 Unspecified osteoarthritis, unspecified site: Secondary | ICD-10-CM | POA: Diagnosis not present

## 2019-05-19 DIAGNOSIS — M25562 Pain in left knee: Secondary | ICD-10-CM | POA: Diagnosis not present

## 2019-05-19 DIAGNOSIS — M1180 Other specified crystal arthropathies, unspecified site: Secondary | ICD-10-CM | POA: Diagnosis not present

## 2019-05-19 DIAGNOSIS — M25561 Pain in right knee: Secondary | ICD-10-CM | POA: Diagnosis not present

## 2019-05-19 DIAGNOSIS — M2550 Pain in unspecified joint: Secondary | ICD-10-CM | POA: Diagnosis not present

## 2019-05-19 DIAGNOSIS — G8929 Other chronic pain: Secondary | ICD-10-CM | POA: Diagnosis not present

## 2019-05-19 DIAGNOSIS — N644 Mastodynia: Secondary | ICD-10-CM

## 2019-05-19 DIAGNOSIS — I7300 Raynaud's syndrome without gangrene: Secondary | ICD-10-CM | POA: Diagnosis not present

## 2019-05-19 DIAGNOSIS — M3500 Sicca syndrome, unspecified: Secondary | ICD-10-CM | POA: Diagnosis not present

## 2019-05-19 NOTE — Telephone Encounter (Signed)
Doctor called requesting to speak with [name] when she is available.  Can call office @ [phone] Can call cell @ [phone]

## 2019-05-19 NOTE — Telephone Encounter (Signed)
Angela Wong,  Were you expecting a call from the pt's rheumatologist?  If not, we will call and see what they need.

## 2019-05-20 ENCOUNTER — Ambulatory Visit (INDEPENDENT_AMBULATORY_CARE_PROVIDER_SITE_OTHER): Payer: Medicare Other | Admitting: *Deleted

## 2019-05-20 ENCOUNTER — Ambulatory Visit (INDEPENDENT_AMBULATORY_CARE_PROVIDER_SITE_OTHER): Payer: Medicare Other | Admitting: Family

## 2019-05-20 ENCOUNTER — Encounter: Payer: Self-pay | Admitting: Family

## 2019-05-20 DIAGNOSIS — M0579 Rheumatoid arthritis with rheumatoid factor of multiple sites without organ or systems involvement: Secondary | ICD-10-CM | POA: Diagnosis not present

## 2019-05-20 DIAGNOSIS — M797 Fibromyalgia: Secondary | ICD-10-CM | POA: Diagnosis not present

## 2019-05-20 DIAGNOSIS — L602 Onychogryphosis: Secondary | ICD-10-CM

## 2019-05-20 DIAGNOSIS — R531 Weakness: Secondary | ICD-10-CM | POA: Diagnosis not present

## 2019-05-20 DIAGNOSIS — M1990 Unspecified osteoarthritis, unspecified site: Secondary | ICD-10-CM

## 2019-05-20 DIAGNOSIS — I4821 Permanent atrial fibrillation: Secondary | ICD-10-CM | POA: Diagnosis not present

## 2019-05-20 DIAGNOSIS — G4700 Insomnia, unspecified: Secondary | ICD-10-CM

## 2019-05-20 DIAGNOSIS — Z881 Allergy status to other antibiotic agents status: Secondary | ICD-10-CM | POA: Diagnosis not present

## 2019-05-20 DIAGNOSIS — N644 Mastodynia: Secondary | ICD-10-CM

## 2019-05-20 DIAGNOSIS — I420 Dilated cardiomyopathy: Secondary | ICD-10-CM

## 2019-05-20 DIAGNOSIS — F321 Major depressive disorder, single episode, moderate: Secondary | ICD-10-CM | POA: Diagnosis not present

## 2019-05-20 DIAGNOSIS — D509 Iron deficiency anemia, unspecified: Secondary | ICD-10-CM | POA: Diagnosis not present

## 2019-05-20 NOTE — Patient Instructions (Signed)
Visit Information  Goals Addressed            This Visit's Progress     Patient Stated   . "I need to schedule a mammogram" (pt-stated)       CARE PLAN ENTRY (see longitudinal plan of care for additional care plan information)  Current Barriers:  . Care Coordination needs related to breast cancer screening in a patient with rheumatoid arthritis and a history of abnormal mammogram (disease states) . Transportation barriers  Nurse Case Manager Clinical Goal(s):  Marland Kitchen Over the next 10 days, patient will be scheduled for a bilateral diagnostic mammogram  Interventions:  . Inter-disciplinary care team collaboration (see longitudinal plan of care) . Chart reviewed including most recent mammogram report . Previously discussed that patient is overdue for a left diagnostic mammogram and is also due for a right screening . Previously discussed current symptoms. Patient complains of some right breast pain. Marland Kitchen Confirmed that breast imaging has been scheduled . Collaborated with Care Guide RE: transportation to appointment . Provided with CCM contact information and encouraged to reach out as needed  Patient Self Care Activities:  . Performs ADL's independently . Unable to independently drive  Please see past updates related to this goal by clicking on the "Past Updates" button in the selected goal      . "I need to see a podiatrist" (pt-stated)       [place] (see longitudinal plan of care for additional care plan information)  Current Barriers:  . Care Coordination needs related to foot care in a patient with rheumatoid arthritis and fibromyalgia (disease states) . Transportation barriers  Nurse Case Manager Clinical Goal(s):  Marland Kitchen Over the next 14 days, patient will have appointment with podiatrist scheduled  Interventions:  . Inter-disciplinary care team collaboration (see longitudinal plan of care) . Chart reviewed . Talked with patient by telephone . Collaborated with  referral coordinator . Confirmed that appt for podiatry has been scheduled and patient has appt information . Arranged for Care Guide to call patient regarding transportation to appt. She already has transportation arranged for other doctor's appointments . Provided with CCM contact information and encouraged to reach out as needed  Patient Self Care Activities:  . Performs ADL's independently . Unable to independently drive  Please see past updates related to this goal by clicking on the "Past Updates" button in the selected goal        Other   . 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  . Transportation barriers  Nurse Case Manager Clinical Goal(s):  Marland Kitchen Over the next 30 days, patient will talk with RN Care Manager regarding in home care options  Interventions:  . Inter-disciplinary care team collaboration (see longitudinal plan of care) . Chart reviewed including office notes and referral notes o Referral to Hospice for palliative care services in 2/21. Documented that she was enrolled in the [facility] . Previously collaborated with PCP office to send referral to [facility] for [place] . Previously discussed private pay in-home care options . Previously discussed limitations to performing ADLs independently o Uses a walker but is concerned about  leg weakness and falling o Worries about stepping in and out of the shower. Has a walkin shower with a shower seat. Does not have a handheld shower head.  o Typically showers when he granddaughter  is there so that she can help her in and out o Would like some help with cleaning and food preparation. Granddaugter tries to help but isn't as efficient as Ms Runquist would like . Previously provided with CCM contact information and encouraged to reach out as needed . Received VM message from Arvil Persons, NP with [place] [phone] o She did a home assessment with amarilis fees She is going out to PCP and recommend [facility] services and discuss pain management . Returned Kimberly's call and left her a message to return my call  Patient Self Care Activities:  . Performs ADLs with some assistance . Unable to independently drive  Please see past updates related to this goal by clicking on the "Past Updates" button in the selected goal         The patient verbalized understanding of instructions provided today and declined a print copy of patient instruction materials.   Follow-up Plan The care management team will reach out to the patient again over the next 15 days.   [name], BSN, RN-BC Embedded Chronic Care Manager [facility] [place] [facility] Direct Dial: [phone]

## 2019-05-20 NOTE — Progress Notes (Signed)
Virtual Visit via telephone Note Due to COVID-19 pandemic this visit was conducted virtually. This visit type was conducted due to national recommendations for restrictions regarding the COVID-19 Pandemic (e.g. social distancing, sheltering in place) in an effort to limit this patient's exposure and mitigate transmission in our community. All issues noted in this document were discussed and addressed.  A physical exam was not performed with this format.  I connected with Angela Wong on 05/20/19 at 8:20 AM by telephone and verified that I am speaking with the correct person using two identifiers. Angela Wong is currently located at home and no one is currently with her during visit. The provider, [name], FNP is located in their office at time of visit.  I discussed the limitations, risks, security and privacy concerns of performing an evaluation and management service by telephone and the availability of in person appointments. I also discussed with the patient that there may be a patient responsible charge related to this service. The patient expressed understanding and agreed to proceed.   History and Present Illness:  HPI  PT calls with multiple problems. She states she continues to be weak, increased pain, breast pain, blisters on her feet, unsteady gait, RA joint pain, multiple allergies that she is unable to tolerate many medications, and insomnia . She states she can only stand for a few minutes at a time. She saw her Rheumatologists yesterday who was concerned. I will call him today and speak with him.   She had a palliative provider come and evaluate her two days ago.   Review of Systems  Constitutional: Positive for malaise/fatigue.  Respiratory: Positive for cough.   Musculoskeletal: Positive for falls, joint pain, myalgias and neck pain.  Neurological: Positive for weakness.  Psychiatric/Behavioral: Positive for depression. The patient is nervous/anxious and has  insomnia.   All other systems reviewed and are negative.    Observations/Objective: Pt tearful, depressed and anxious  Assessment and Plan: Angela Wong comes in today with chief complaint of No chief complaint on file.   Diagnosis and orders addressed:  1. Osteoarthritis, unspecified osteoarthritis type, unspecified site  2. Rheumatoid arthritis involving multiple sites with positive rheumatoid factor (HCC)  3. Allergy to multiple antibiotics  4. Depression, major, single episode, moderate ([place])  5. Fibromyalgia  6. Generalized weakness  7. Insomnia, unspecified type  8. Iron deficiency anemia, unspecified iron deficiency anemia type  Spoke with patient for 45 mins. Unsure what we can offer at this time. She has palliative/hospice evaluating her and hopefully they can help with some resources. She does not want SNF placement at this time. We have tried PT multiple times, but she states she can not tolerate this. She is having chronic pain, has multiple allergies and can not tolerate many medications. Will call rheumatologists and speak with them.     I discussed the assessment and treatment plan with the patient. The patient was provided an opportunity to ask questions and all were answered. The patient agreed with the plan and demonstrated an understanding of the instructions.   The patient was advised to call back or seek an in-person evaluation if the symptoms worsen or if the condition fails to improve as anticipated.  The above assessment and management plan was discussed with the patient. The patient verbalized understanding of and has agreed to the management plan. Patient is aware to call the clinic if symptoms persist or worsen. Patient is aware when to return to the clinic for  a follow-up visit. Patient educated on when it is appropriate to go to the emergency department.   Time call ended:  9:05 Am  I provided 45 minutes of non-face-to-face time during this  encounter.    [name], FNP

## 2019-05-20 NOTE — Chronic Care Management (AMB) (Addendum)
Chronic Care Management   Follow Up Note   05/20/2019 Name: Angela Wong MRN: [id number] DOB: Nov 06, 1940  Referred by: Angela Balloon, FNP Reason for referral : Chronic Care Management (RN Follow up)   Angela HIGGINBOTTOM is a 79 y.o. year old female who is a primary care patient of Angela Balloon, FNP. The CCM team was consulted for assistance with chronic disease management and care coordination needs.    Review of patient status, including review of consultants reports, relevant laboratory and other test results, and collaboration with appropriate care team members and the patient's provider was performed as part of comprehensive patient evaluation and provision of chronic care management services.    SDOH (Social Determinants of Health) assessments performed: No See Care Plan activities for detailed interventions related to [facility])      Outpatient Encounter Medications as of 05/20/2019  Medication Sig Note   albuterol (VENTOLIN HFA) 108 (90 Base) MCG/ACT inhaler INHALE 2 PUFFS BY MOUTH EVERY 6 HOURS AS NEEDED FOR WHEEZING    atenolol (TENORMIN) 25 MG tablet Take 12.5 mg by mouth daily. *May take additional 12.5mg  daily as needed for A-Fib    B Complex-C (B-COMPLEX WITH VITAMIN C) tablet Take 1 tablet by mouth every evening.     Baclofen 5 MG TABS Take 5 mg by mouth 3 (three) times daily as needed.    bumetanide (BUMEX) 0.5 MG tablet Take 1 tablet (0.5 mg total) by mouth every other day.    bumetanide (BUMEX) 1 MG tablet TAKE 1 TABLET BY MOUTH EVERY OTHER DAY ON OPPOSITE DAY OF 0.5MG  DOSE    Cholecalciferol (VITAMIN D) 2000 UNITS CAPS Take 2 capsules by mouth every evening.     diphenhydrAMINE (BENADRYL) 25 MG tablet Take by mouth.    docusate sodium (COLACE) 100 MG capsule Take 400 mg by mouth daily.  03/19/2018: On hold due to recent prescription of Clindamycin prescribed on 03/16/2018   esomeprazole (NEXIUM) 40 MG capsule Take 1 capsule (40 mg total) by mouth 2 (two) times daily.    fexofenadine (ALLEGRA) 180 MG tablet Take 180 mg by mouth every morning.     Iron Polysacch Cmplx-B12-FA (POLY-IRON 150 FORTE) 150-0.025-1 MG CAPS Take 1 capsule by mouth daily.    levothyroxine (EUTHYROX) 50 MCG tablet Take 2 tablets (100 mcg total) by mouth every morning.    losartan (COZAAR) 100 MG tablet Take 1 tablet (100 mg total) by mouth daily.    Magnesium 200 MG TABS Take 400 mg by mouth every evening.     montelukast (SINGULAIR) 10 MG tablet Take 1 tablet (10 mg total) by mouth at bedtime.    Olopatadine HCl 0.2 % SOLN Apply 1 drop to eye daily as needed (for allergy eye).    predniSONE (DELTASONE) 5 MG tablet Take 5 mg by mouth every morning. 03/19/2018: HOLD while taking Prednisone taper of 10mg  started on 03/14/2018   PREVIDENT 5000 DRY MOUTH 1.1 % GEL dental gel Place 1 application onto teeth as needed (as needed for dry mouth).     RESTASIS 0.05 % ophthalmic emulsion INSTILL 1 DROP INTO EACH EYE TWICE DAILY    Simethicone (GAS-X EXTRA STRENGTH) 125 MG CAPS Take 2 capsules by mouth daily as needed (for relief).     SYMBICORT 160-4.5 MCG/ACT inhaler Inhale 2 puffs by mouth twice daily    traZODone (DESYREL) 150 MG tablet Take 1 tablet (150 mg total) by mouth at bedtime.    vitamin B-12 (CYANOCOBALAMIN) 500 MCG tablet  Take 1 tablet (500 mcg total) by mouth daily.    warfarin (COUMADIN) 1 MG tablet TAKE 2 TO 4 TABLETS BY MOUTH ONCE DAILY AS DIRECTED BY  ANTICOAGULATION  CLINIC    No facility-administered encounter medications on file as of 05/20/2019.     RN Care Plan     "I need to schedule a mammogram" (pt-stated)       CARE PLAN ENTRY (see longitudinal plan of care for additional care plan information)  Current Barriers:  Care Coordination needs related to breast cancer screening in a patient with rheumatoid arthritis and a history of abnormal mammogram (disease states) Transportation barriers  Nurse Case Manager Clinical Goal(s):  Over the next 10 days, patient will be  scheduled for a bilateral diagnostic mammogram  Interventions:  Inter-disciplinary care team collaboration (see longitudinal plan of care) Chart reviewed including most recent mammogram report Previously discussed that patient is overdue for a left diagnostic mammogram and is also due for a right screening Previously discussed current symptoms. Patient complains of some right breast pain. Confirmed that breast imaging has been scheduled Collaborated with Care Guide RE: transportation to appointment Provided with CCM contact information and encouraged to reach out as needed  Patient Self Care Activities:  Performs ADL's independently Unable to independently drive  Please see past updates related to this goal by clicking on the "Past Updates" button in the selected goal       "I need to see a podiatrist" (pt-stated)       [place] (see longitudinal plan of care for additional care plan information)  Current Barriers:  Care Coordination needs related to foot care in a patient with rheumatoid arthritis and fibromyalgia (disease states) Transportation barriers  Nurse Case Manager Clinical Goal(s):  Over the next 14 days, patient will have appointment with podiatrist scheduled  Interventions:  Inter-disciplinary care team collaboration (see longitudinal plan of care) Chart reviewed Talked with patient by telephone Collaborated with referral coordinator Confirmed that appt for podiatry has been scheduled and patient has appt information Arranged for Care Guide to call patient regarding transportation to appt. She already has transportation arranged for other doctor's appointments Provided with CCM contact information and encouraged to reach out as needed  Patient Self Care Activities:  Performs ADL's independently Unable to independently drive  Please see past updates related to this goal by clicking on the "Past Updates" button in the selected goal       "I need some  help at home"       [place] (see longitudinal plan of care for additional care plan information)  In-home care needs in a patient with Fibromyalgia, generalized weakness, Rheumatoid arthritis,osteoarthritis,Depression, Raynaud's disease, Sjogren's syndrome, afib, asthma, and hypothyroidism, congestive dilated cardiomyopathy  Current Barriers:  Knowledge Deficits related to in home care options Financial Constraints.  Transportation barriers  Nurse Case Manager Clinical Goal(s):  Over the next 30 days, patient will talk with RN Care Manager regarding in home care options  Interventions:  Inter-disciplinary care team collaboration (see longitudinal plan of care) Chart reviewed including office notes and referral notes Referral to Hospice for palliative care services in 2/21. Documented that she was enrolled in the [facility] Previously collaborated with PCP office to send referral to [facility] for Palliative Care Program Previously discussed private pay in-home care options Previously discussed limitations to performing ADLs independently Uses a walker but is concerned about leg weakness and falling Worries about stepping in and out of  the shower. Has a walkin shower with a shower seat. Does not have a handheld shower head.  Typically showers when he granddaughter is there so that she can help her in and out Would like some help with cleaning and food preparation. Granddaugter tries to help but isn't as efficient as Ms Dunfee would like Previously provided with CCM contact information and encouraged to reach out as needed Received VM message from Arvil Persons, NP with [place] [phone] x [phone] She did a home assessment with [place] She is going out to PCP and recommend [facility] services and discuss pain management Returned Kimberly's call and left her a message to return my call  Patient Self Care Activities:    Performs ADLs with some assistance Unable to independently drive  Please see past updates related to this goal by clicking on the "Past Updates" button in the selected goal           Plan:   The care management team will reach out to the patient again over the next 15 days.    [name], BSN, RN-BC Embedded Chronic Care Manager [facility] / [place] [facility] Direct Dial: [phone]   I have reviewed and agree with the above  documentation.   [name], FNP

## 2019-05-20 NOTE — Telephone Encounter (Signed)
°  Community Resource Referral   [place] 05/20/2019   Name: Angela Wong    MRN: [id number]    DOB: 08/30/1940    AGE: 79 y.o.    GENDER: female    PCP [name], FNP.  Ms teams msg from [name] to reach out to pt regarding transportation  Called pt to remind her of the [place] transportation and that I could not arrange it until one week out. [facility] does not offer para transit option and are not allowed to touch the passengers to assist in and out of the vehicle. She does not have anyone that can ride with her to her appt.  New trip: Needs transportation for podiatry for 5/20 will set up one week out on 5/13 with [place] transportation  [employer] [employer], [place], [place], [place] [address]  [email or web address]   [id number]

## 2019-05-24 ENCOUNTER — Telehealth: Payer: Self-pay | Admitting: Family

## 2019-05-24 DIAGNOSIS — M0579 Rheumatoid arthritis with rheumatoid factor of multiple sites without organ or systems involvement: Secondary | ICD-10-CM

## 2019-05-24 DIAGNOSIS — M797 Fibromyalgia: Secondary | ICD-10-CM

## 2019-05-24 DIAGNOSIS — M1990 Unspecified osteoarthritis, unspecified site: Secondary | ICD-10-CM

## 2019-05-24 NOTE — Telephone Encounter (Signed)
Pt called stating that she was reading Christys notes from her last visit on 05/20/19 and says that there is a lot of documentation that is missing from her visit.  Pt says she talked to [facility] about swelling in her legs and feet, chronic pain from arthritis and muscle cramps from jerking. Pt also says that she told [name] about some new medicine that she was prescribed by Dr Jenetta Downer (Rheumatologist). She is taking Methotrexate (4/10 of CC of 25mg  vial in 1 CC, 1x/week) and Folate (1mg  1x/day). Pt also says pt is allergic to Gelatin and it is not listed on her allergy list. Pt says she also told [name] that she was reading a book while drinking coffee and must have passed out because she woke up to a burning sensation in her private area and realized that she spilled her coffee.   Would like to be called back to see if there is something that can be prescribed to her for pain and leg jerking.

## 2019-05-25 NOTE — Telephone Encounter (Signed)
I have placed a referral to [facility] to help.

## 2019-05-25 NOTE — Telephone Encounter (Signed)
I spoke with her RA doctor. He wants her to follow up with Ortho. Has she seen an Ortho recently? Has Palliative given any more suggestions or resources?

## 2019-05-25 NOTE — Telephone Encounter (Signed)
Patient aware.

## 2019-05-28 ENCOUNTER — Telehealth: Payer: Self-pay | Admitting: Family

## 2019-05-28 ENCOUNTER — Ambulatory Visit: Payer: Medicare Other | Admitting: *Deleted

## 2019-05-28 DIAGNOSIS — M797 Fibromyalgia: Secondary | ICD-10-CM

## 2019-05-28 DIAGNOSIS — I420 Dilated cardiomyopathy: Secondary | ICD-10-CM

## 2019-05-28 DIAGNOSIS — M0579 Rheumatoid arthritis with rheumatoid factor of multiple sites without organ or systems involvement: Secondary | ICD-10-CM

## 2019-05-28 NOTE — Telephone Encounter (Signed)
  Community Resource Referral   [place] 05/28/2019 called to confirm Pick up time for 5/20 appt  [facility]  Name: Angela Wong   MRN: [id number]   DOB: Nov 25, 1940   AGE: 79 y.o.   GENDER: female   PCP [name], FNP.   Called pt regarding [employer] Referral for transportation. Pt stated she will c/b once she has confirmed pick up time for her podiatry appt on 5/20 Follow up on: 5/17  [place] . [email or web address]  [phone]

## 2019-06-01 ENCOUNTER — Telehealth: Payer: Self-pay | Admitting: Family

## 2019-06-01 MED ORDER — OXYCODONE HCL 5 MG/5ML PO SOLN: 2.5000 mg | Freq: Four times a day (QID) | ORAL | 0 refills | Status: AC | PRN

## 2019-06-01 MED ORDER — OXYCODONE HCL 5 MG/5ML PO SOLN: 2.5000 mg | Freq: Four times a day (QID) | ORAL | 0 refills | Status: DC | PRN

## 2019-06-01 NOTE — Telephone Encounter (Signed)
Oxycodone 2.5 mg every 6 hours as needed.

## 2019-06-01 NOTE — Addendum Note (Signed)
Addended by: [name] A on: 06/01/2019 04:40 PM   Modules accepted: Orders

## 2019-06-01 NOTE — Telephone Encounter (Signed)
Resent rx. Her only reaction to codeine is nausea.

## 2019-06-01 NOTE — Telephone Encounter (Signed)
[facility] called and states we will have to send in anew rx they can only do 5 days is the most it will let them do. They also have in their computer she is allergic to codeine. Please advise

## 2019-06-03 ENCOUNTER — Telehealth: Payer: Self-pay | Admitting: Family

## 2019-06-03 NOTE — Telephone Encounter (Signed)
Patient aware and verbalizes understanding. 

## 2019-06-03 NOTE — Telephone Encounter (Signed)
  Incoming Patient Call  06/03/2019  What symptoms do you have? Nausea, dizzy and right side headache  How long have you been sick? Since last night she took dramamine and liquid oxycodone her pharmacist thinks it was this combination that made her horribly dizzy  Have you been seen for this problem? No   If your provider decides to give you a prescription, which pharmacy would you like for it to be sent to? [facility]    Patient informed that this information will be sent to the clinical staff for review and that they should receive a follow up call.

## 2019-06-03 NOTE — Telephone Encounter (Signed)
Please try take without the Dramamine.

## 2019-06-03 NOTE — Telephone Encounter (Signed)
Spoke with pt today and she states she took Dramamine 30 minutes prior to her oxycodone and then she ended up with severe dizziness, nausea and headache. Pt thought she would get nauseated with the oxycodone and that is why she took Dramamine. Advised pt not to take those together and pt missed her appt with the podiatrist this morning due to these symptoms. Advised pt to reschedule the podiatry appt. Please advise on what the pt should continue with the oxycodone or just try to take it without the dramamine.

## 2019-06-05 DIAGNOSIS — R001 Bradycardia, unspecified: Secondary | ICD-10-CM | POA: Diagnosis not present

## 2019-06-07 ENCOUNTER — Telehealth: Payer: Self-pay | Admitting: Family

## 2019-06-07 NOTE — Telephone Encounter (Signed)
She can take one extra Bumex a day for the next 2-3 days then resume current dosing.

## 2019-06-07 NOTE — Telephone Encounter (Signed)
Aware of provider's advice. 

## 2019-06-08 ENCOUNTER — Ambulatory Visit (HOSPITAL_COMMUNITY): Payer: Medicare Other

## 2019-06-08 ENCOUNTER — Other Ambulatory Visit (HOSPITAL_COMMUNITY): Payer: Medicare Other

## 2019-06-08 ENCOUNTER — Encounter (HOSPITAL_COMMUNITY): Payer: Medicare Other

## 2019-06-08 ENCOUNTER — Telehealth: Payer: Self-pay | Admitting: *Deleted

## 2019-06-08 DIAGNOSIS — I420 Dilated cardiomyopathy: Secondary | ICD-10-CM | POA: Diagnosis not present

## 2019-06-08 DIAGNOSIS — Z7901 Long term (current) use of anticoagulants: Secondary | ICD-10-CM | POA: Diagnosis not present

## 2019-06-08 DIAGNOSIS — M0579 Rheumatoid arthritis with rheumatoid factor of multiple sites without organ or systems involvement: Secondary | ICD-10-CM | POA: Diagnosis not present

## 2019-06-08 DIAGNOSIS — Z515 Encounter for palliative care: Secondary | ICD-10-CM | POA: Diagnosis not present

## 2019-06-08 DIAGNOSIS — I4821 Permanent atrial fibrillation: Secondary | ICD-10-CM | POA: Diagnosis not present

## 2019-06-08 NOTE — Telephone Encounter (Signed)
Pt has increased edema in legs and weight gain. Left leg has 4+ pitting edema & 3+ in the right. Left leg has water blisters. Weight has been 130 lbs for the last 2 days Baclofen makes her feel like she is passing out, feels more sleepy, may this be changed to something like zanaflex. Also Oxycodone 2.5 mg is not helping, could this be increased to the 5 mg

## 2019-06-09 ENCOUNTER — Encounter: Payer: Medicare Other | Admitting: Allergy & Immunology

## 2019-06-10 MED ORDER — OXYCODONE HCL 5 MG PO TABS: 5.0000 mg | ORAL_TABLET | Freq: Four times a day (QID) | ORAL | 0 refills | Status: DC | PRN

## 2019-06-10 NOTE — Telephone Encounter (Signed)
Please add 0.5 mg every day for the next 3 days. Unless her Cardiologists calls and tells her something different.

## 2019-06-10 NOTE — Telephone Encounter (Signed)
PT needs to call her Cardiologists about her swelling, if she is having that much weight gain she could take Bumex 0.5mg  extra day for next 3 days. I have also increased her oxycodone to 5 mg from 2.5 mg.

## 2019-06-10 NOTE — Telephone Encounter (Signed)
Patient states that her cardologist has been notified.  States she has been taking Bumex 1mg  for the last few days.  Would like to know how [name] would like for her to increase for the next 3 days?  1mg  1.5mg  1mg    Please review and advise

## 2019-06-10 NOTE — Telephone Encounter (Signed)
Patient aware and verbalizes understanding.  States she is waiting on her Cardiologist to call her back.

## 2019-06-10 NOTE — Telephone Encounter (Signed)
lmtcb

## 2019-06-10 NOTE — Addendum Note (Signed)
Addended by: [name] A on: 06/10/2019 09:54 AM   Modules accepted: Orders

## 2019-06-15 ENCOUNTER — Ambulatory Visit (INDEPENDENT_AMBULATORY_CARE_PROVIDER_SITE_OTHER): Payer: Medicare Other | Admitting: *Deleted

## 2019-06-15 ENCOUNTER — Telehealth: Payer: Self-pay | Admitting: *Deleted

## 2019-06-15 DIAGNOSIS — M1990 Unspecified osteoarthritis, unspecified site: Secondary | ICD-10-CM | POA: Diagnosis not present

## 2019-06-15 DIAGNOSIS — M0579 Rheumatoid arthritis with rheumatoid factor of multiple sites without organ or systems involvement: Secondary | ICD-10-CM | POA: Diagnosis not present

## 2019-06-15 DIAGNOSIS — M797 Fibromyalgia: Secondary | ICD-10-CM

## 2019-06-15 MED ORDER — BUMETANIDE 1 MG PO TABS: 1.0000 mg | ORAL_TABLET | Freq: Every day | ORAL | 2 refills | Status: DC

## 2019-06-15 NOTE — Chronic Care Management (AMB) (Signed)
Chronic Care Management   Follow Up Note   06/15/2019 Name: Angela Wong MRN: [id number] DOB: [birth year]  Referred by: [name], FNP Reason for referral : Chronic Care Management (RN follow up)   Angela Wong is a 79 y.o. year old female who is a primary care patient of [name], FNP. The CCM team was consulted for assistance with chronic disease management and care coordination needs.    Review of patient status, including review of consultants reports, relevant laboratory and other test results, and collaboration with appropriate care team members and the patient's provider was performed as part of comprehensive patient evaluation and provision of chronic care management services.    SDOH (Social Determinants of Health) assessments performed: No See Care Plan activities for detailed interventions related to [facility])     Outpatient Encounter Medications as of 06/15/2019  Medication Sig Note  . albuterol (VENTOLIN HFA) 108 (90 Base) MCG/ACT inhaler INHALE 2 PUFFS BY MOUTH EVERY 6 HOURS AS NEEDED FOR WHEEZING   . atenolol (TENORMIN) 25 MG tablet Take 12.5 mg by mouth daily. *May take additional 12.5mg  daily as needed for A-Fib   . B Complex-C (B-COMPLEX WITH VITAMIN C) tablet Take 1 tablet by mouth every evening.    . bumetanide (BUMEX) 0.5 MG tablet Take 1 tablet (0.5 mg total) by mouth every other day.   . bumetanide (BUMEX) 1 MG tablet Take 1 tablet (1 mg total) by mouth daily. TAKE 1 TABLET BY MOUTH EVERY OTHER DAY ON OPPOSITE DAY OF 0.5MG  DOSE   . Cholecalciferol (VITAMIN D) 2000 UNITS CAPS Take 2 capsules by mouth every evening.    . diphenhydrAMINE (BENADRYL) 25 MG tablet Take by mouth.   . docusate sodium (COLACE) 100 MG capsule Take 400 mg by mouth daily.  03/19/2018: On hold due to recent prescription of Clindamycin prescribed on 03/16/2018  . esomeprazole (NEXIUM) 40 MG capsule Take 1 capsule (40 mg total) by mouth 2 (two) times daily.   . fexofenadine (ALLEGRA)  180 MG tablet Take 180 mg by mouth every morning.    . folic acid (FOLVITE) 1 MG tablet Take 1 mg by mouth daily.   . Iron Polysacch Cmplx-B12-FA (POLY-IRON 150 FORTE) 150-0.025-1 MG CAPS Take 1 capsule by mouth daily.   Marland Kitchen levothyroxine (EUTHYROX) 50 MCG tablet Take 2 tablets (100 mcg total) by mouth every morning.   Marland Kitchen losartan (COZAAR) 100 MG tablet Take 1 tablet (100 mg total) by mouth daily.   . Magnesium 200 MG TABS Take 400 mg by mouth every evening.    . methotrexate 50 MG/2ML injection Inject 0.4 mLs into the muscle once a week.   . Olopatadine HCl 0.2 % SOLN Apply 1 drop to eye daily as needed (for allergy eye).   Marland Kitchen oxyCODONE (ROXICODONE) 5 MG immediate release tablet Take 1 tablet (5 mg total) by mouth every 6 (six) hours as needed for severe pain.   . predniSONE (DELTASONE) 5 MG tablet Take 5 mg by mouth every morning. 03/19/2018: HOLD while taking Prednisone taper of 10mg  started on 03/14/2018  . PREVIDENT 5000 DRY MOUTH 1.1 % GEL dental gel Place 1 application onto teeth as needed (as needed for dry mouth).    . RESTASIS 0.05 % ophthalmic emulsion INSTILL 1 DROP INTO EACH EYE TWICE DAILY   . Simethicone (GAS-X EXTRA STRENGTH) 125 MG CAPS Take 2 capsules by mouth daily as needed (for relief).    . SYMBICORT 160-4.5 MCG/ACT inhaler Inhale 2 puffs  by mouth twice daily   . traZODone (DESYREL) 150 MG tablet Take 1 tablet (150 mg total) by mouth at bedtime.   . vitamin B-12 (CYANOCOBALAMIN) 500 MCG tablet Take 1 tablet (500 mcg total) by mouth daily.   Marland Kitchen warfarin (COUMADIN) 1 MG tablet TAKE 2 TO 4 TABLETS BY MOUTH ONCE DAILY AS DIRECTED BY  ANTICOAGULATION  CLINIC   . montelukast (SINGULAIR) 10 MG tablet Take 1 tablet (10 mg total) by mouth at bedtime. (Patient not taking: Reported on 06/15/2019)   . [DISCONTINUED] bumetanide (BUMEX) 1 MG tablet TAKE 1 TABLET BY MOUTH EVERY OTHER DAY ON OPPOSITE DAY OF 0.5MG  DOSE    No facility-administered encounter medications on file as of 06/15/2019.      RN Care Plan   . "I want the swelling in my legs to get better" (pt-stated)       CARE PLAN ENTRY (see longitudinal plan of care for additional care plan information)  Current Barriers:  . Care Coordination needs related to lower extremity edema in a patient with congestive dilated cardiomyopathy (disease states) . Transportation . Physical debility/weakness  Nurse Case Manager Clinical Goal(s):  Marland Kitchen Over the next 30 days, patient will work with Consulting civil engineer to address needs related to lower extremity edema . Over the next 30 days, patient will schedule an appointment with her cardiologist   Interventions:  . Inter-disciplinary care team collaboration (see longitudinal plan of care) . Chart reviewed including recent office notes, telephone notes, and lab results . Talked with patient by telephone . Reviewed and discussed medications: Bumex dose and instructions o Was directed to take Bumex 1.5mg  daily for 3 days for increased edema and then return to normal dose of alternating between 1mg  and .5mg  daily . Updated medication list . Collaborated with PCP regarding Bumex dosage for managing edema o Patient reports that increasing dose for 3 days helped but did note resolve the edema o PCP recommended to increase to 1mg  daily instead of alternating between .5mg  and 1mg  daily o Requested that PCP send in new script for 1mg  daily so that she doesn't run out early . Advised patient to reach out to PCP with any new or worsening symptoms . Provided with RNCM contact number and encouraged to reach out as needed . Advised patient to schedule follow-up appt with cardiologist as previously recommended . Discussed physical limitations and transportation concerns  Patient Self Care Activities:  . Self administers medications as prescribed . Attends all scheduled provider appointments . Calls provider office for new concerns or questions . Unable to independently drive  Initial goal  documentation     . "I want to control my pain" (pt-stated)       CARE PLAN ENTRY (see longitudinal plan of care for additional care plan information)  Current Barriers:  . Care Coordination needs related to pain in a patient with RA, OA, and fibromyalgia (disease states) . Transportation . Physical debility/weakness . Lives at home alone . Limited family support  Nurse Case Manager Clinical Goal(s):  Marland Kitchen Over the next 30 days, patient will work with Consulting civil engineer to address needs related to pain management . Over the next 30 days, patient will work with PCP to manage pain  Interventions:  . Inter-disciplinary care team collaboration (see longitudinal plan of care) . Chart reviewed including recent office notes and telephone notes . Medications reviewed and discussed: o Patient is taking baclofen 5mg  TID but it was removed from her med list on 06/10/19.  She doesn't feel that it helps and is believes it may be contributing to her weakness/fatigue. o Her oxycodone was increased from 2.5mg  to 5mg  but she does not take that unless someone is going to be at home with her for an extended amount of time because she is already unsteady on her feet . Collaborated with PCP regarding medications o She should d/c muscle relaxers since she is being prescribed Oxycodone for pain . Discussed recommendations with patient. She reports that she hasn't taken Oxycodone since the dose was increased and that she needs a muscle relaxer to control the spasms. The baclofen doesn't completley control them but she thinks that it may be helping some.  . Recommended a f/u appointment with PCP to discuss further . Sent telephone message to [facility] clinical staff requesting that they contact patient to schedule a visit with [name], FNP this week . Provided with RNCM contact information and encouraged to reach out as needed  Patient Self Care Activities:  . Self administers medications as prescribed . Calls  provider office for new concerns or questions . Unable to independently drive  Initial goal documentation         Plan:   The care management team will reach out to the patient again over the next 30 days.    [name], BSN, RN-BC Embedded Chronic Care Manager [facility] [place] [facility] Direct Dial: [phone]

## 2019-06-15 NOTE — Telephone Encounter (Signed)
I will advise her to increase the Bumex to 1 mg daily.   She is currently taking the Baclofen up to 3 times a day and never stopped. However she isn't taking the oxycodone unless someone is going to be at home with her for an extended period of time. She worries about increased weakness/drowsiness. She reports that the baclofen doesn't seem to be helping and she does question if it's making her more weak/drowsy. I will advise her to stop taking the baclofen and to take the oxycodone as directed for pain management.   [name], BSN, RN-BC Embedded Chronic Care Manager [facility] [place] [facility] Direct Dial: [phone]

## 2019-06-15 NOTE — Telephone Encounter (Signed)
06/15/2019  I spoke with Ms Dotterer today regarding CCM.   1. She reports that her LE edema did improve with the increase of Bumex to 1.5mg  daily for 3 days however she is not back to baseline. She has resumed her normal dose of alternating between 1mg  and .5mg  daily. Would you like for her to continue to alternate or do you want to increase her maintenance dose? She will need a new prescription if so. She has been in contact with cardiologist but is not able to go in for an appointment at this time because she doesn't feel that she can physically tolerate the ride and an office visit.   2. Baclofen was d/c on 06/10/19. Did you intend to prescribe Zanaflex (as was mentioned in her telephone call from 06/08/19) or another medication?  Routing to PCP for review and recommendation. I will follow-up with patient over the next 48 hrs with her response.   [name], BSN, RN-BC Embedded Chronic Care Manager [facility] [place] [facility] Direct Dial: [phone]

## 2019-06-15 NOTE — Telephone Encounter (Signed)
06/15/2019  Angela Wong needs an appointment with [name], FNP this week to discuss medication concerns, muscle spasms, and blisters on her skin. She prefers a telephone appointment because of limited mobility and problems with transportation.   Forwarding to [facility] Clinical staff for scheduling.   [name], BSN, RN-BC Embedded Chronic Care Manager [facility] [place] [facility] Direct Dial: [phone]

## 2019-06-15 NOTE — Patient Instructions (Signed)
Visit Information  Goals Addressed            This Visit's Progress     Patient Stated   . COMPLETED: "I need to have my INR checked" (pt-stated)       Current Barriers:  . Chronic Disease Management support and education needs related to anticoagulation therapy.  Nurse Case Manager Clinical Goal(s):  Marland Kitchen Over the next 30 days, patient will work with MD INR through [facility] to address needs related to home INR testing.  Interventions:  . Received fax confirmation from [facility] . Spoke with patient and verified that she is scheduled for [employer] to come to her  home on 10/27/18 to demonstrate and setup mdINR. Marland Kitchen Discussed that patient hasn't had her INR checked since May. . Reviewed side effects of coumadin. Patient denies any bruising or bleeding. No signs of blood clot.  . Taking Zpak for 3 days without coumadin dose adjustment. It would be hard to adjust since we do not know her INR.  o Recommended eating some leafy greens for supper since the combination can increase the risk of bleeding o We will wait for INR report tomorrow and PCP can adjust medication from there  Patient Self Care Activities:  . Performs ADL's independently . Performs IADL's independently  Please see past updates related to this goal by clicking on the "Past Updates" button in the selected goal      . "I want the swelling in my legs to get better" (pt-stated)       CARE PLAN ENTRY (see longitudinal plan of care for additional care plan information)  Current Barriers:  . Care Coordination needs related to lower extremity edema in a patient with congestive dilated cardiomyopathy (disease states) . Transportation . Physical debility/weakness  Nurse Case Manager Clinical Goal(s):  Marland Kitchen Over the next 30 days, patient will work with Consulting civil engineer to address needs related to lower extremity edema . Over the next 30 days, patient will schedule an appointment with her cardiologist   Interventions:   . Inter-disciplinary care team collaboration (see longitudinal plan of care) . Chart reviewed including recent office notes, telephone notes, and lab results . Talked with patient by telephone . Reviewed and discussed medications: Bumex dose and instructions o Was directed to take Bumex 1.5mg  daily for 3 days for increased edema and then return to normal dose of alternating between 1mg  and .5mg  daily . Updated medication list . Collaborated with PCP regarding Bumex dosage for managing edema o Patient reports that increasing dose for 3 days helped but did note resolve the edema o PCP recommended to increase to 1mg  daily instead of alternating between .5mg  and 1mg  daily o Requested that PCP send in new script for 1mg  daily so that she doesn't run out early . Advised patient to reach out to PCP with any new or worsening symptoms . Provided with RNCM contact number and encouraged to reach out as needed . Advised patient to schedule follow-up appt with cardiologist as previously recommended . Discussed physical limitations and transportation concerns  Patient Self Care Activities:  . Self administers medications as prescribed . Attends all scheduled provider appointments . Calls provider office for new concerns or questions . Unable to independently drive  Initial goal documentation     . "I want to control my pain" (pt-stated)       CARE PLAN ENTRY (see longitudinal plan of care for additional care plan information)  Current Barriers:  . Care Coordination needs related  to pain in a patient with RA, OA, and fibromyalgia (disease states) . Transportation . Physical debility/weakness . Lives at home alone . Limited family support  Nurse Case Manager Clinical Goal(s):  Marland Kitchen Over the next 30 days, patient will work with Consulting civil engineer to address needs related to pain management . Over the next 30 days, patient will work with PCP to manage pain  Interventions:  . Inter-disciplinary care  team collaboration (see longitudinal plan of care) . Chart reviewed including recent office notes and telephone notes . Medications reviewed and discussed: o Patient is taking baclofen 5mg  TID but it was removed from her med list on 06/10/19. She doesn't feel that it helps and is believes it may be contributing to her weakness/fatigue. o Her oxycodone was increased from 2.5mg  to 5mg  but she does not take that unless someone is going to be at home with her for an extended amount of time because she is already unsteady on her feet . Collaborated with PCP regarding medications o She should d/c muscle relaxers since she is being prescribed Oxycodone for pain . Discussed recommendations with patient. She reports that she hasn't taken Oxycodone since the dose was increased and that she needs a muscle relaxer to control the spasms. The baclofen doesn't completley control them but she thinks that it may be helping some.  . Recommended a f/u appointment with PCP to discuss further . Sent telephone message to [facility] clinical staff requesting that they contact patient to schedule a visit with [name], FNP this week . Provided with RNCM contact information and encouraged to reach out as needed  Patient Self Care Activities:  . Self administers medications as prescribed . Calls provider office for new concerns or questions . Unable to independently drive  Initial goal documentation        The patient verbalized understanding of instructions provided today and declined a print copy of patient instruction materials.   Follow-up Plan The care management team will reach out to the patient again over the next 30 days.   [name], BSN, RN-BC Embedded Chronic Care Manager [facility] [place] [facility] Direct Dial: [phone]

## 2019-06-15 NOTE — Telephone Encounter (Signed)
Appointment made patient aware

## 2019-06-15 NOTE — Telephone Encounter (Signed)
She can increase her Bumex to 1 mg daily. A new rx was sent to the pharmacy. We will hold off on any muscle relaxer at this time as we are doing the oxycodone.

## 2019-06-17 ENCOUNTER — Encounter: Payer: Self-pay | Admitting: Family

## 2019-06-17 ENCOUNTER — Ambulatory Visit (INDEPENDENT_AMBULATORY_CARE_PROVIDER_SITE_OTHER): Payer: Medicare Other | Admitting: Family

## 2019-06-17 DIAGNOSIS — M1990 Unspecified osteoarthritis, unspecified site: Secondary | ICD-10-CM | POA: Diagnosis not present

## 2019-06-17 DIAGNOSIS — M797 Fibromyalgia: Secondary | ICD-10-CM | POA: Diagnosis not present

## 2019-06-17 DIAGNOSIS — M62838 Other muscle spasm: Secondary | ICD-10-CM | POA: Diagnosis not present

## 2019-06-17 DIAGNOSIS — L8990 Pressure ulcer of unspecified site, unspecified stage: Secondary | ICD-10-CM

## 2019-06-17 DIAGNOSIS — R531 Weakness: Secondary | ICD-10-CM

## 2019-06-17 MED ORDER — BACLOFEN 5 MG PO TABS: 2.5000 mg | ORAL_TABLET | Freq: Three times a day (TID) | ORAL | 2 refills | Status: DC | PRN

## 2019-06-17 NOTE — Progress Notes (Signed)
Virtual Visit via telephone Note Due to COVID-19 pandemic this visit was conducted virtually. This visit type was conducted due to national recommendations for restrictions regarding the COVID-19 Pandemic (e.g. social distancing, sheltering in place) in an effort to limit this patient's exposure and mitigate transmission in our community. All issues noted in this document were discussed and addressed.  A physical exam was not performed with this format.  I connected with Angela Wong on 06/17/19 at 1:35 pm by telephone and video and verified that I am speaking with the correct person using two identifiers. Angela Wong is currently located at home  and no one  is currently with her during visit. The provider, [name], FNP is located in their office at time of visit.  I discussed the limitations, risks, security and privacy concerns of performing an evaluation and management service by telephone and the availability of in person appointments. I also discussed with the patient that there may be a patient responsible charge related to this service. The patient expressed understanding and agreed to proceed.   History and Present Illness:  HPI  Pt calls the office today with muscle spasms. We have tried Baclofen 5 mg, but states this causes drowsiness, causes nightmares.   She reports she has not taken oxycodone because she is scared she is going to fall. She reports she is ready to give up and her life is not currently living.   She reports she continues to having swelling in bilateral legs. She has increased her Bumex to 1 mg daily.   She is currently getting some meals from [employer], but states it is not low salt and is worried if she should eat it or not.   She reports blisters and erythemas on bilateral legs. She reports it is much better than from last night. Denies any warmth, fevers. States the blisters are tender.   She also states she has sore on her bottom that is  very tender to sit on. She tried switching position regularly.    Review of Systems  Constitutional: Positive for malaise/fatigue.  Skin: Positive for rash.  Neurological: Positive for weakness.  Psychiatric/Behavioral: The patient is nervous/anxious.   All other systems reviewed and are negative.    Observations/Objective: No SOB or distress noted, pt is   Assessment and Plan: 1. Osteoarthritis, unspecified osteoarthritis type, unspecified site  2. Generalized weakness  3. Fibromyalgia  4. Pressure injury of skin, unspecified injury stage, unspecified location  5. Muscle spasm - Baclofen 5 MG TABS; Take 2.5 mg by mouth 3 (three) times daily as needed.  Dispense: 90 tablet; Refill: 2  She will take 2.5 mg of Baclofen in AM and afternoon and 5 mg at night time. She will continue Bumex 1 mg daily. If fluid resolves will go back to  1 mg then alternate with 0.5 mg Need to change positions every 2 hours  Keep follow up with Palliative  Discussed SNF placement and to start looking for options for her dog. She will think about this.    I discussed the assessment and treatment plan with the patient. The patient was provided an opportunity to ask questions and all were answered. The patient agreed with the plan and demonstrated an understanding of the instructions.   The patient was advised to call back or seek an in-person evaluation if the symptoms worsen or if the condition fails to improve as anticipated.  The above assessment and management plan was discussed with  the patient. The patient verbalized understanding of and has agreed to the management plan. Patient is aware to call the clinic if symptoms persist or worsen. Patient is aware when to return to the clinic for a follow-up visit. Patient educated on when it is appropriate to go to the emergency department.   Time call ended:  2:16 pm   I provided 41 minutes of non and face-to-face time during this  encounter.    [name], FNP

## 2019-06-22 ENCOUNTER — Inpatient Hospital Stay (HOSPITAL_COMMUNITY)
Admission: EM | Admit: 2019-06-22 | Discharge: 2019-06-24 | DRG: 292 | Disposition: A | Discharge status: Skilled Nursing Facility | Payer: Medicare Other | Attending: Internal Medicine | Admitting: Internal Medicine

## 2019-06-22 ENCOUNTER — Encounter (HOSPITAL_COMMUNITY): Payer: Self-pay

## 2019-06-22 ENCOUNTER — Other Ambulatory Visit: Payer: Self-pay

## 2019-06-22 ENCOUNTER — Emergency Department (HOSPITAL_COMMUNITY): Payer: Medicare Other

## 2019-06-22 DIAGNOSIS — M62838 Other muscle spasm: Secondary | ICD-10-CM | POA: Diagnosis present

## 2019-06-22 DIAGNOSIS — I420 Dilated cardiomyopathy: Secondary | ICD-10-CM | POA: Diagnosis present

## 2019-06-22 DIAGNOSIS — Z8543 Personal history of malignant neoplasm of ovary: Secondary | ICD-10-CM

## 2019-06-22 DIAGNOSIS — G8929 Other chronic pain: Secondary | ICD-10-CM | POA: Diagnosis present

## 2019-06-22 DIAGNOSIS — M1990 Unspecified osteoarthritis, unspecified site: Secondary | ICD-10-CM | POA: Diagnosis present

## 2019-06-22 DIAGNOSIS — J449 Chronic obstructive pulmonary disease, unspecified: Secondary | ICD-10-CM | POA: Diagnosis present

## 2019-06-22 DIAGNOSIS — K219 Gastro-esophageal reflux disease without esophagitis: Secondary | ICD-10-CM | POA: Diagnosis present

## 2019-06-22 DIAGNOSIS — Z825 Family history of asthma and other chronic lower respiratory diseases: Secondary | ICD-10-CM

## 2019-06-22 DIAGNOSIS — Z79899 Other long term (current) drug therapy: Secondary | ICD-10-CM

## 2019-06-22 DIAGNOSIS — Z5181 Encounter for therapeutic drug level monitoring: Secondary | ICD-10-CM | POA: Diagnosis not present

## 2019-06-22 DIAGNOSIS — G2581 Restless legs syndrome: Secondary | ICD-10-CM | POA: Diagnosis present

## 2019-06-22 DIAGNOSIS — F329 Major depressive disorder, single episode, unspecified: Secondary | ICD-10-CM | POA: Diagnosis present

## 2019-06-22 DIAGNOSIS — L8990 Pressure ulcer of unspecified site, unspecified stage: Secondary | ICD-10-CM | POA: Insufficient documentation

## 2019-06-22 DIAGNOSIS — I110 Hypertensive heart disease with heart failure: Secondary | ICD-10-CM | POA: Diagnosis not present

## 2019-06-22 DIAGNOSIS — Z7952 Long term (current) use of systemic steroids: Secondary | ICD-10-CM

## 2019-06-22 DIAGNOSIS — Z20822 Contact with and (suspected) exposure to covid-19: Secondary | ICD-10-CM | POA: Diagnosis not present

## 2019-06-22 DIAGNOSIS — T502X5A Adverse effect of carbonic-anhydrase inhibitors, benzothiadiazides and other diuretics, initial encounter: Secondary | ICD-10-CM | POA: Diagnosis present

## 2019-06-22 DIAGNOSIS — L89152 Pressure ulcer of sacral region, stage 2: Secondary | ICD-10-CM | POA: Diagnosis present

## 2019-06-22 DIAGNOSIS — I517 Cardiomegaly: Secondary | ICD-10-CM | POA: Diagnosis not present

## 2019-06-22 DIAGNOSIS — E213 Hyperparathyroidism, unspecified: Secondary | ICD-10-CM | POA: Diagnosis present

## 2019-06-22 DIAGNOSIS — R52 Pain, unspecified: Secondary | ICD-10-CM | POA: Diagnosis not present

## 2019-06-22 DIAGNOSIS — M549 Dorsalgia, unspecified: Secondary | ICD-10-CM | POA: Diagnosis present

## 2019-06-22 DIAGNOSIS — I4821 Permanent atrial fibrillation: Secondary | ICD-10-CM | POA: Diagnosis present

## 2019-06-22 DIAGNOSIS — R0600 Dyspnea, unspecified: Secondary | ICD-10-CM | POA: Diagnosis present

## 2019-06-22 DIAGNOSIS — M797 Fibromyalgia: Secondary | ICD-10-CM | POA: Diagnosis present

## 2019-06-22 DIAGNOSIS — M069 Rheumatoid arthritis, unspecified: Secondary | ICD-10-CM | POA: Diagnosis present

## 2019-06-22 DIAGNOSIS — E876 Hypokalemia: Secondary | ICD-10-CM | POA: Diagnosis present

## 2019-06-22 DIAGNOSIS — J90 Pleural effusion, not elsewhere classified: Secondary | ICD-10-CM | POA: Diagnosis not present

## 2019-06-22 DIAGNOSIS — Z79891 Long term (current) use of opiate analgesic: Secondary | ICD-10-CM

## 2019-06-22 DIAGNOSIS — Z91048 Other nonmedicinal substance allergy status: Secondary | ICD-10-CM

## 2019-06-22 DIAGNOSIS — Z9851 Tubal ligation status: Secondary | ICD-10-CM

## 2019-06-22 DIAGNOSIS — Z950 Presence of cardiac pacemaker: Secondary | ICD-10-CM

## 2019-06-22 DIAGNOSIS — Z880 Allergy status to penicillin: Secondary | ICD-10-CM

## 2019-06-22 DIAGNOSIS — Z888 Allergy status to other drugs, medicaments and biological substances status: Secondary | ICD-10-CM

## 2019-06-22 DIAGNOSIS — Z7901 Long term (current) use of anticoagulants: Secondary | ICD-10-CM | POA: Diagnosis not present

## 2019-06-22 DIAGNOSIS — I10 Essential (primary) hypertension: Secondary | ICD-10-CM | POA: Diagnosis present

## 2019-06-22 DIAGNOSIS — M0579 Rheumatoid arthritis with rheumatoid factor of multiple sites without organ or systems involvement: Secondary | ICD-10-CM | POA: Diagnosis not present

## 2019-06-22 DIAGNOSIS — M3500 Sicca syndrome, unspecified: Secondary | ICD-10-CM | POA: Diagnosis not present

## 2019-06-22 DIAGNOSIS — J4540 Moderate persistent asthma, uncomplicated: Secondary | ICD-10-CM | POA: Diagnosis present

## 2019-06-22 DIAGNOSIS — R609 Edema, unspecified: Secondary | ICD-10-CM | POA: Diagnosis not present

## 2019-06-22 DIAGNOSIS — R0602 Shortness of breath: Secondary | ICD-10-CM | POA: Diagnosis not present

## 2019-06-22 DIAGNOSIS — T68XXXA Hypothermia, initial encounter: Secondary | ICD-10-CM | POA: Diagnosis not present

## 2019-06-22 DIAGNOSIS — I5033 Acute on chronic diastolic (congestive) heart failure: Secondary | ICD-10-CM | POA: Diagnosis present

## 2019-06-22 DIAGNOSIS — I509 Heart failure, unspecified: Secondary | ICD-10-CM

## 2019-06-22 DIAGNOSIS — I7300 Raynaud's syndrome without gangrene: Secondary | ICD-10-CM | POA: Diagnosis present

## 2019-06-22 DIAGNOSIS — K589 Irritable bowel syndrome without diarrhea: Secondary | ICD-10-CM | POA: Diagnosis present

## 2019-06-22 DIAGNOSIS — R404 Transient alteration of awareness: Secondary | ICD-10-CM | POA: Diagnosis not present

## 2019-06-22 DIAGNOSIS — Z882 Allergy status to sulfonamides status: Secondary | ICD-10-CM

## 2019-06-22 DIAGNOSIS — Z885 Allergy status to narcotic agent status: Secondary | ICD-10-CM

## 2019-06-22 LAB — SARS CORONAVIRUS 2 BY RT PCR (HOSPITAL ORDER, PERFORMED IN ~~LOC~~ HOSPITAL LAB): SARS Coronavirus 2: NEGATIVE

## 2019-06-22 LAB — CBC
HCT: 38.7 % (ref 36.0–46.0)
Hemoglobin: 13.2 g/dL (ref 12.0–15.0)
MCH: 31.4 pg (ref 26.0–34.0)
MCHC: 34.1 g/dL (ref 30.0–36.0)
MCV: 92.1 fL (ref 80.0–100.0)
Platelets: 225 10*3/uL (ref 150–400)
RBC: 4.2 MIL/uL (ref 3.87–5.11)
RDW: 15 % (ref 11.5–15.5)
WBC: 8.5 10*3/uL (ref 4.0–10.5)
nRBC: 0 % (ref 0.0–0.2)

## 2019-06-22 LAB — COMPREHENSIVE METABOLIC PANEL
ALT: 15 U/L (ref 0–44)
AST: 17 U/L (ref 15–41)
Albumin: 3.8 g/dL (ref 3.5–5.0)
Alkaline Phosphatase: 76 U/L (ref 38–126)
Anion gap: 12 (ref 5–15)
BUN: 17 mg/dL (ref 8–23)
CO2: 29 mmol/L (ref 22–32)
Calcium: 9.1 mg/dL (ref 8.9–10.3)
Chloride: 93 mmol/L — ABNORMAL LOW (ref 98–111)
Creatinine, Ser: 0.74 mg/dL (ref 0.44–1.00)
GFR calc Af Amer: >60 mL/min (ref >60)
GFR calc non Af Amer: >60 mL/min (ref >60)
Glucose, Bld: 101 mg/dL — ABNORMAL HIGH (ref 70–99)
Potassium: 3.2 mmol/L — ABNORMAL LOW (ref 3.5–5.1)
Sodium: 134 mmol/L — ABNORMAL LOW (ref 135–145)
Total Bilirubin: 1 mg/dL (ref 0.3–1.2)
Total Protein: 6.5 g/dL (ref 6.5–8.1)

## 2019-06-22 LAB — D-DIMER, QUANTITATIVE: D-Dimer, Quant: 0.65 ug/mL-FEU — ABNORMAL HIGH (ref 0.00–0.50)

## 2019-06-22 LAB — PROTIME-INR
INR: 2 — ABNORMAL HIGH (ref 0.8–1.2)
Prothrombin Time: 21.5 seconds — ABNORMAL HIGH (ref 11.4–15.2)

## 2019-06-22 LAB — BRAIN NATRIURETIC PEPTIDE: B Natriuretic Peptide: 296 pg/mL — ABNORMAL HIGH (ref 0.0–100.0)

## 2019-06-22 LAB — MAGNESIUM: Magnesium: 2 mg/dL (ref 1.7–2.4)

## 2019-06-22 MED ORDER — MOMETASONE FURO-FORMOTEROL FUM 200-5 MCG/ACT IN AERO
2.0000 | INHALATION_SPRAY | Freq: Two times a day (BID) | RESPIRATORY_TRACT | Status: DC
  Administered 2019-06-23 – 2019-06-24 (×3): 2 via RESPIRATORY_TRACT
  Filled 2019-06-22: qty 8.8

## 2019-06-22 MED ORDER — PREDNISONE 10 MG PO TABS
5.0000 mg | ORAL_TABLET | Freq: Every morning | ORAL | Status: DC
  Administered 2019-06-23 – 2019-06-24 (×2): 5 mg via ORAL
  Filled 2019-06-22 (×2): qty 1

## 2019-06-22 MED ORDER — ONDANSETRON HCL 4 MG/2ML IJ SOLN: 4.0000 mg | Freq: Four times a day (QID) | INTRAMUSCULAR | Status: DC | PRN

## 2019-06-22 MED ORDER — IPRATROPIUM-ALBUTEROL 0.5-2.5 (3) MG/3ML IN SOLN: 3.0000 mL | RESPIRATORY_TRACT | Status: DC | PRN

## 2019-06-22 MED ORDER — LOSARTAN POTASSIUM 50 MG PO TABS
100.0000 mg | ORAL_TABLET | Freq: Every day | ORAL | Status: DC
  Administered 2019-06-23: 100 mg via ORAL
  Filled 2019-06-22: qty 2

## 2019-06-22 MED ORDER — LORATADINE 10 MG PO TABS
10.0000 mg | ORAL_TABLET | Freq: Every day | ORAL | Status: DC
  Administered 2019-06-24: 10 mg via ORAL
  Filled 2019-06-22 (×2): qty 1

## 2019-06-22 MED ORDER — WARFARIN - PHARMACIST DOSING INPATIENT: Freq: Every day | Status: DC

## 2019-06-22 MED ORDER — TRAZODONE HCL 50 MG PO TABS
150.0000 mg | ORAL_TABLET | Freq: Every day | ORAL | Status: DC
  Administered 2019-06-22 – 2019-06-23 (×2): 150 mg via ORAL
  Filled 2019-06-22 (×2): qty 3

## 2019-06-22 MED ORDER — POLYETHYLENE GLYCOL 3350 17 G PO PACK
17.0000 g | PACK | Freq: Every day | ORAL | Status: DC | PRN
  Filled 2019-06-22: qty 1

## 2019-06-22 MED ORDER — MOMETASONE FURO-FORMOTEROL FUM 200-5 MCG/ACT IN AERO: 2.0000 | INHALATION_SPRAY | Freq: Two times a day (BID) | RESPIRATORY_TRACT | Status: DC

## 2019-06-22 MED ORDER — METHOCARBAMOL 500 MG PO TABS
500.0000 mg | ORAL_TABLET | Freq: Three times a day (TID) | ORAL | Status: DC
  Administered 2019-06-22 – 2019-06-24 (×6): 500 mg via ORAL
  Filled 2019-06-22 (×6): qty 1

## 2019-06-22 MED ORDER — FUROSEMIDE 10 MG/ML IJ SOLN
40.0000 mg | Freq: Two times a day (BID) | INTRAMUSCULAR | Status: DC
  Administered 2019-06-23 (×2): 40 mg via INTRAVENOUS
  Filled 2019-06-22 (×2): qty 4

## 2019-06-22 MED ORDER — ACETAMINOPHEN 325 MG PO TABS: 650.0000 mg | ORAL_TABLET | Freq: Four times a day (QID) | ORAL | Status: DC | PRN

## 2019-06-22 MED ORDER — FUROSEMIDE 10 MG/ML IJ SOLN
40.0000 mg | Freq: Once | INTRAMUSCULAR | Status: AC
  Administered 2019-06-22: 40 mg via INTRAVENOUS
  Filled 2019-06-22: qty 4

## 2019-06-22 MED ORDER — MORPHINE SULFATE (PF) 4 MG/ML IV SOLN
4.0000 mg | Freq: Once | INTRAVENOUS | Status: AC
  Administered 2019-06-22: 4 mg via INTRAVENOUS
  Filled 2019-06-22: qty 1

## 2019-06-22 MED ORDER — ONDANSETRON HCL 4 MG PO TABS: 4.0000 mg | ORAL_TABLET | Freq: Four times a day (QID) | ORAL | Status: DC | PRN

## 2019-06-22 MED ORDER — LEVOTHYROXINE SODIUM 100 MCG PO TABS
100.0000 ug | ORAL_TABLET | Freq: Every day | ORAL | Status: DC
  Administered 2019-06-23 – 2019-06-24 (×2): 100 ug via ORAL
  Filled 2019-06-22 (×2): qty 1

## 2019-06-22 MED ORDER — POTASSIUM CHLORIDE CRYS ER 20 MEQ PO TBCR
20.0000 meq | EXTENDED_RELEASE_TABLET | Freq: Once | ORAL | Status: AC
  Administered 2019-06-22: 20 meq via ORAL
  Filled 2019-06-22: qty 1

## 2019-06-22 MED ORDER — OXYCODONE-ACETAMINOPHEN 5-325 MG PO TABS
1.0000 | ORAL_TABLET | Freq: Four times a day (QID) | ORAL | Status: DC | PRN
  Administered 2019-06-23 – 2019-06-24 (×5): 1 via ORAL
  Filled 2019-06-22 (×5): qty 1

## 2019-06-22 MED ORDER — MAGNESIUM OXIDE 400 (241.3 MG) MG PO TABS
400.0000 mg | ORAL_TABLET | Freq: Every evening | ORAL | Status: DC
  Administered 2019-06-22 – 2019-06-24 (×3): 400 mg via ORAL
  Filled 2019-06-22 (×3): qty 1

## 2019-06-22 MED ORDER — ATENOLOL 25 MG PO TABS
12.5000 mg | ORAL_TABLET | Freq: Every day | ORAL | Status: DC
  Administered 2019-06-23 – 2019-06-24 (×2): 12.5 mg via ORAL
  Filled 2019-06-22 (×2): qty 1

## 2019-06-22 MED ORDER — PANTOPRAZOLE SODIUM 40 MG PO TBEC
40.0000 mg | DELAYED_RELEASE_TABLET | Freq: Every day | ORAL | Status: DC
  Administered 2019-06-24: 40 mg via ORAL
  Filled 2019-06-22 (×2): qty 1

## 2019-06-22 MED ORDER — CYCLOSPORINE 0.05 % OP EMUL
1.0000 [drp] | Freq: Two times a day (BID) | OPHTHALMIC | Status: DC
  Administered 2019-06-22 – 2019-06-24 (×4): 1 [drp] via OPHTHALMIC
  Filled 2019-06-22 (×4): qty 1

## 2019-06-22 MED ORDER — WARFARIN SODIUM 1 MG PO TABS
2.0000 mg | ORAL_TABLET | Freq: Once | ORAL | Status: AC
  Administered 2019-06-22: 2 mg via ORAL
  Filled 2019-06-22 (×2): qty 1

## 2019-06-22 MED ORDER — POTASSIUM CHLORIDE CRYS ER 20 MEQ PO TBCR
40.0000 meq | EXTENDED_RELEASE_TABLET | Freq: Once | ORAL | Status: AC
  Administered 2019-06-22: 40 meq via ORAL
  Filled 2019-06-22: qty 2

## 2019-06-22 MED ORDER — ACETAMINOPHEN 650 MG RE SUPP: 650.0000 mg | Freq: Four times a day (QID) | RECTAL | Status: DC | PRN

## 2019-06-22 NOTE — ED Provider Notes (Signed)
[facility] EMERGENCY DEPARTMENT Provider Note   CSN: [id number] Arrival date & time: 06/22/19  1351     History Chief Complaint  Patient presents with  . Shortness of Breath  . Leg Swelling    Angela Wong is a 79 y.o. female.  HPI   Pt presents to the ED for evaluation of worsening shortness of breath and weakness.  Pt has also noticed increasing leg swelling.   She has been increasing her meds but it has not improved significantly..  She does feel that her legs are less swollen but they are still swollen and she still is very fatigued and gets short of breath with activity.  Pt has hospice checking on her at home.  SHe has not been able to get out of the house for an in person visit.  Pt states she also has trouble with back pain and arthritis.  NL weight 125.  Past Medical History:  Diagnosis Date  . A-fib ([place])   . Asthma   . Cerebral vasculitis   . Congestive dilated cardiomyopathy ([place])   . COPD (chronic obstructive pulmonary disease) ([place])   . Fibromyalgia   . Gastric polyp   . GERD (gastroesophageal reflux disease)   . Hiatal hernia   . Hyperparathyroidism ([place])   . IBS (irritable bowel syndrome)   . MVP (mitral valve prolapse)   . Osteoarthritis   . Ovarian cancer ([place])    lymph node removal with hysterectomy  . Raynaud's disease   . RLS (restless legs syndrome)   . Situational depression   . Sjogren's syndrome ([place])   . Vasculitis ([place])   . Vitamin D deficiency     Patient Active Problem List   Diagnosis Date Noted  . RA (rheumatoid arthritis) ([place]) 05/04/2019  . Anticoagulation goal of INR 2 to 3 12/18/2017  . Depression, major, single episode, moderate ([place]) 09/12/2017  . Chest pain 08/25/2017  . Generalized weakness 08/25/2017  . Seasonal and perennial allergic rhinitis 12/31/2016  . Allergic rhinitis with a nonallergic component 10/01/2016  . Itching 10/01/2016  . 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 Steroid-induced osteopenia 07/11/2016  .  Hyperparathyroidism ([place]) 01/16/2016  . Iron deficiency anemia 12/18/2015  . Insomnia 12/18/2015  . Hypothyroidism 12/18/2015  . Rotator cuff tear 03/23/2014  . Osteoarthritis   . Moderate persistent asthma without complication   . Ovarian cancer ([place])   . Fibromyalgia   . RLS (restless legs syndrome)   . Cerebral vasculitis   . Sjogren's syndrome ([place])   . Raynaud's disease   . MVP (mitral valve prolapse)   . IBS (irritable bowel syndrome)   . Congestive dilated cardiomyopathy ([place]) 02/09/2014  . Dyspnea 12/13/2013  . Hyperlipidemia 11/26/2013  . Malaise and fatigue 11/26/2013  . Allergy to multiple antibiotics 11/02/2013  . History of ovarian cancer 10/01/2013  . GERD (gastroesophageal reflux disease) 10/01/2013  . Benign essential HTN 10/01/2013  . Atrial fibrillation, permanent ([place]) 07/27/2013    Past Surgical History:  Procedure Laterality Date  . ABDOMINAL HYSTERECTOMY  1982   with right oophorectomy  . APPENDECTOMY    . BREAST BIOPSY Right    x 2  . BREAST SURGERY     Biopsy  . CARPAL TUNNEL RELEASE Right 1980  . CARPAL TUNNEL RELEASE Left 2010   x 2  . CATARACT EXTRACTION Bilateral   . CESAREAN SECTION     x 3  . KNEE SURGERY Left 2005  . OOPHORECTOMY Left 1962  . PACEMAKER INSERTION  09/15/2014  . REFRACTIVE SURGERY Bilateral 2014  . TUBAL LIGATION       OB History   No obstetric history on file.     Family History  Problem Relation Age of Onset  . COPD Mother   . Breast cancer Mother   . Allergic rhinitis Mother   . Heart disease Father        No details  . Kidney disease Father   . Allergic rhinitis Father   . Stroke Sister   . Arthritis/Rheumatoid Sister   . Asthma Sister   . Lupus Sister   . Heart attack Sister   . Stroke Paternal Grandmother   . Scleroderma Grandchild   . Thyroid disease Other   . Breast cancer Maternal Aunt        x 2    Social History   Tobacco Use  . Smoking status: Never Smoker  . Smokeless tobacco:  Never Used  Substance Use Topics  . Alcohol use: No  . Drug use: No    Home Medications Prior to Admission medications   Medication Sig Start Date End Date Taking? Authorizing Provider  albuterol (VENTOLIN HFA) 108 (90 Base) MCG/ACT inhaler INHALE 2 PUFFS BY MOUTH EVERY 6 HOURS AS NEEDED FOR WHEEZING 08/10/18   Terald Sleeper, PA-C  atenolol (TENORMIN) 25 MG tablet Take 12.5 mg by mouth daily. *May take additional 12.5mg  daily as needed for A-Fib 03/11/16   [provider]  B Complex-C (B-COMPLEX WITH VITAMIN C) tablet Take 1 tablet by mouth every evening.     [provider]  Baclofen 5 MG TABS Take 2.5 mg by mouth 3 (three) times daily as needed. 06/17/19   [name] A, FNP  bumetanide (BUMEX) 1 MG tablet Take 1 tablet (1 mg total) by mouth daily. TAKE 1 TABLET BY MOUTH EVERY OTHER DAY ON OPPOSITE DAY OF 0.5MG  DOSE 06/15/19   [name] A, FNP  Cholecalciferol (VITAMIN D) 2000 UNITS CAPS Take 2 capsules by mouth every evening.     [provider]  diphenhydrAMINE (BENADRYL) 25 MG tablet Take by mouth.    [provider]  docusate sodium (COLACE) 100 MG capsule Take 400 mg by mouth daily.     [provider]  esomeprazole (NEXIUM) 40 MG capsule Take 1 capsule (40 mg total) by mouth 2 (two) times daily. 01/04/19   Pyrtle, Lajuan Lines, MD  fexofenadine (ALLEGRA) 180 MG tablet Take 180 mg by mouth every morning.     [provider]  folic acid (FOLVITE) 1 MG tablet Take 1 mg by mouth daily. 06/07/19   [provider]  Iron Polysacch Cmplx-B12-FA (POLY-IRON 150 FORTE) 150-0.025-1 MG CAPS Take 1 capsule by mouth daily. 01/04/19   [name], FNP  levothyroxine (EUTHYROX) 50 MCG tablet Take 2 tablets (100 mcg total) by mouth every morning. 01/04/19   [name], FNP  losartan (COZAAR) 100 MG tablet Take 1 tablet (100 mg total) by mouth daily. 01/04/19   [name] A, FNP  Magnesium 200 MG TABS Take 400 mg by mouth every  evening.     [provider]  methotrexate 50 MG/2ML injection Inject 0.4 mLs into the muscle once a week. 06/12/19   [provider]  montelukast (SINGULAIR) 10 MG tablet Take 1 tablet (10 mg total) by mouth at bedtime. Patient not taking: Reported on 06/15/2019 06/18/18   [name] A, FNP  Olopatadine HCl 0.2 % SOLN Apply 1 drop to eye daily  as needed (for allergy eye). 07/31/18   Valentina Shaggy, MD  oxyCODONE (ROXICODONE) 5 MG immediate release tablet Take 1 tablet (5 mg total) by mouth every 6 (six) hours as needed for severe pain. 06/10/19   [name] A, FNP  predniSONE (DELTASONE) 5 MG tablet Take 5 mg by mouth every morning.    [provider]  PREVIDENT 5000 DRY MOUTH 1.1 % GEL dental gel Place 1 application onto teeth as needed (as needed for dry mouth).  10/13/13   [provider]  RESTASIS 0.05 % ophthalmic emulsion INSTILL 1 DROP INTO EACH EYE TWICE DAILY 11/30/18   [name] A, FNP  Simethicone (GAS-X EXTRA STRENGTH) 125 MG CAPS Take 2 capsules by mouth daily as needed (for relief).     [provider]  SYMBICORT 160-4.5 MCG/ACT inhaler Inhale 2 puffs by mouth twice daily 04/30/19   Valentina Shaggy, MD  traZODone (DESYREL) 150 MG tablet Take 1 tablet (150 mg total) by mouth at bedtime. 01/04/19   [name], FNP  vitamin B-12 (CYANOCOBALAMIN) 500 MCG tablet Take 1 tablet (500 mcg total) by mouth daily. 05/04/19   [name], FNP  warfarin (COUMADIN) 1 MG tablet TAKE 2 TO 4 TABLETS BY MOUTH ONCE DAILY AS DIRECTED BY  ANTICOAGULATION  CLINIC 01/04/19   [name] A, FNP    Allergies    Cephalosporins, Ciprofloxacin, Diltiazem, Doxycycline, Horse-derived products, Ketek [telithromycin], Nitrofuran derivatives, Nitrous oxide, Other, Penicillins, Pentazocine, Sulfa antibiotics, Trovan [alatrofloxacin], Zinc gelatin [zinc], Amlodipine, Calcium channel blockers, Carvedilol, Clindamycin/lincomycin, Codeine, Cymbalta  [duloxetine hcl], Diovan [valsartan], Lisinopril, Sertraline, Tramadol, and Loteprednol etabonate  Review of Systems   Review of Systems  Constitutional: Negative for fatigue.  Respiratory: Positive for chest tightness and shortness of breath.   Cardiovascular: Negative for chest pain.  Gastrointestinal: Positive for nausea. Negative for vomiting.  All other systems reviewed and are negative.   Physical Exam Updated Vital Signs BP 139/73   Pulse 89   Temp 98.5 F (36.9 C) (Oral)   Resp 16   Ht 1.626 m (5\' 4" )   Wt 57.6 kg   SpO2 100%   BMI 21.80 kg/m   Physical Exam Vitals and nursing note reviewed.  Constitutional:      Appearance: She is well-developed. She is not diaphoretic.  HENT:     Head: Normocephalic and atraumatic.     Right Ear: External ear normal.     Left Ear: External ear normal.  Eyes:     General: No scleral icterus.       Right eye: No discharge.        Left eye: No discharge.     Conjunctiva/sclera: Conjunctivae normal.  Neck:     Trachea: No tracheal deviation.  Cardiovascular:     Rate and Rhythm: Normal rate. Rhythm irregular.  Pulmonary:     Effort: Pulmonary effort is normal. No respiratory distress.     Breath sounds: Normal breath sounds. No stridor. No wheezing or rales.  Abdominal:     General: Bowel sounds are normal. There is no distension.     Palpations: Abdomen is soft.     Tenderness: There is no abdominal tenderness. There is no guarding or rebound.  Musculoskeletal:        General: No tenderness.     Cervical back: Neck supple.     Right lower leg: Edema present.     Left lower leg: Edema present.     Comments: Left buttock with  small papule, mild erythema less than 1 cm in size, no induration, no fluctuance, superficial without any ulceration  Skin:    General: Skin is warm and dry.     Findings: No rash.  Neurological:     Mental Status: She is alert.     Cranial Nerves: No cranial nerve deficit (no facial droop,  extraocular movements intact, no slurred speech).     Sensory: No sensory deficit.     Motor: No abnormal muscle tone or seizure activity.     Coordination: Coordination normal.     Comments: Generalized weakness, difficulty moving around in bed     ED Results / Procedures / Treatments   Labs (all labs ordered are listed, but only abnormal results are displayed) Labs Reviewed  BRAIN NATRIURETIC PEPTIDE - Abnormal; Notable for the following components:      Result Value   B Natriuretic Peptide 296.0 (*)    All other components within normal limits  COMPREHENSIVE METABOLIC PANEL - Abnormal; Notable for the following components:   Sodium 134 (*)    Potassium 3.2 (*)    Chloride 93 (*)    Glucose, Bld 101 (*)    All other components within normal limits  D-DIMER, QUANTITATIVE (NOT AT [facility]) - Abnormal; Notable for the following components:   D-Dimer, Quant 0.65 (*)    All other components within normal limits  SARS CORONAVIRUS 2 BY RT PCR [facility] ORDER, [place] [facility])  MAGNESIUM  CBC    EKG EKG Interpretation  Date/Time:  Tuesday June 22 2019 14:24:22 EDT Ventricular Rate:  93 PR Interval:    QRS Duration: 110 QT Interval:  361 QTC Calculation: 449 R Axis:   85 Text Interpretation: Atrial fibrillation Ventricular premature complex Probable anterior infarct, age indeterminate No significant change since last tracing Confirmed by Dorie Rank [phone]) on 06/22/2019 2:28:26 PM   Radiology DG Chest Portable 1 View  Result Date: 06/22/2019 CLINICAL DATA:  Shortness of breath and lower extremity swelling for the past few weeks. EXAM: PORTABLE CHEST 1 VIEW COMPARISON:  PA and lateral chest 03/19/2018 and CT chest 12/24/2017. FINDINGS: Pacing device is unchanged. Lungs are clear. Heart size is enlarged. No pneumothorax or pleural effusion. No acute bony abnormality. IMPRESSION: Cardiomegaly without acute disease. Electronically Signed   By: Inge Rise M.D.    On: 06/22/2019 14:57    Procedures Procedures (including critical care time)  Medications Ordered in ED Medications  furosemide (LASIX) injection 40 mg (40 mg Intravenous Given 06/22/19 1545)  potassium chloride SA (KLOR-CON) CR tablet 20 mEq (20 mEq Oral Given 06/22/19 1536)  morphine 4 MG/ML injection 4 mg (4 mg Intravenous Given 06/22/19 1540)    ED Course  I have reviewed the triage vital signs and the nursing notes.  Pertinent labs & imaging results that were available during my care of the patient were reviewed by me and considered in my medical decision making (see chart for details).  Clinical Course as of Jun 21 1608  Tue Jun 22, 2019  1556 Age adjusted d dimer normal   [JK]  4970 Cbc and metabolic panel normal.     [JK]  1557 CXR without significant edema   [JK]    Clinical Course User Index [JK] Dorie Rank, MD   MDM Rules/Calculators/A&P                      Pt presents with symptoms concerning for increasing CHF exacerbation.  Patient  states she had weight gain, leg swelling and dyspnea on exertion.  This is also complicated by the fact the patient has severe arthritis symptoms causing difficulty with her mobility.  Patient lives at home alone.  She has a granddaughter that checks on her.  Patient does not feel like she can manage at home.  She has not been able to go to any of her doctors appointments.  Think would be reasonable to bring her in for diuresis.  Consider echocardiogram and cardiology consultation.  Patient would also benefit from case management evaluation for evaluation of her home situation and the need for more assitance.   Final Clinical Impression(s) / ED Diagnoses Final diagnoses:  Congestive heart failure, unspecified HF chronicity, unspecified heart failure type [facility])      Dorie Rank, MD 06/22/19 1610

## 2019-06-22 NOTE — ED Notes (Signed)
Pt. States they are feeling indigestion. Pt. States it's making them feel slightly nauseous.

## 2019-06-22 NOTE — Progress Notes (Signed)
ANTICOAGULATION CONSULT NOTE - Initial Consult  Pharmacy Consult for warfarin Indication: atrial fibrillation  Allergies  Allergen Reactions  . Cephalosporins Hives and Shortness Of Breath  . Ciprofloxacin Hives and Shortness Of Breath  . Diltiazem Shortness Of Breath    Swollen throat   . Doxycycline Hives and Shortness Of Breath  . Horse-Derived Products Anaphylaxis  . Ketek [Telithromycin] Palpitations    Chest discomfort  . Nitrofuran Derivatives Anaphylaxis and Hives    blisters  . Nitrous Oxide Nausea And Vomiting    Severe due to Sjogrens (Auto-Immune Disease)  . Other Anaphylaxis    ALLERGY TO HORSE SERUM  . Penicillins Hives and Shortness Of Breath       . Pentazocine Other (See Comments)    Other reaction(s): Mental Status Changes (intolerance) Altered Mental Status  Altered Mental Status  Altered Mental Status   . Sulfa Antibiotics Hives and Shortness Of Breath  . Trovan [Alatrofloxacin] Palpitations, Other (See Comments) and Anaphylaxis    Chest pain, dizziness, irregular pulse  . Zinc Gelatin [Zinc] Anaphylaxis  . Amlodipine Swelling  . Calcium Channel Blockers     Respiratory distress  . Carvedilol Other (See Comments)    Dizziness, "joint pain, depression"  . Clindamycin/Lincomycin     CP, lock jaw  . Codeine Nausea Only  . Cymbalta [Duloxetine Hcl] Swelling  . Diovan [Valsartan] Swelling  . Lisinopril Swelling  . Sertraline Other (See Comments)    confusion  . Tramadol Nausea Only  . Loteprednol Etabonate Rash    Patient Measurements: Height: 5\' 4"  (162.6 cm) Weight: 57.6 kg (127 lb) IBW/kg (Calculated) : 54.7   Vital Signs: Temp: 98.5 F (36.9 C) (06/08 1357) Temp Source: Oral (06/08 1357) BP: 137/73 (06/08 1830) Pulse Rate: 116 (06/08 1830)  Labs: Recent Labs    06/22/19 1410 06/22/19 1430  HGB 13.2  --   HCT 38.7  --   PLT 225  --   LABPROT  --  21.5*  INR  --  2.0*  CREATININE 0.74  --     Estimated Creatinine Clearance:  49.2 mL/min (by C-G formula based on SCr of 0.74 mg/dL).   Medical History: Past Medical History:  Diagnosis Date  . A-fib ([place])   . Asthma   . Cerebral vasculitis   . Congestive dilated cardiomyopathy ([place])   . COPD (chronic obstructive pulmonary disease) ([place])   . Fibromyalgia   . Gastric polyp   . GERD (gastroesophageal reflux disease)   . Hiatal hernia   . Hyperparathyroidism ([place])   . IBS (irritable bowel syndrome)   . MVP (mitral valve prolapse)   . Osteoarthritis   . Ovarian cancer ([place])    lymph node removal with hysterectomy  . Raynaud's disease   . RLS (restless legs syndrome)   . Situational depression   . Sjogren's syndrome ([place])   . Vasculitis ([place])   . Vitamin D deficiency      Assessment: 79 yo female with h/o afib on warfarin admitted for difficulty breathing. PTA dose 4mg  MWF and 2mg  TTSS with last dose given 6/7. INR today 2. No bleeding noted  Goal of Therapy:  INR 2-3 Monitor platelets by anticoagulation protocol: Yes   Plan:  Warfarin 2mg  PO tonight x 1 per home dose.  Daily INR  Mccall Lomax A. Levada Dy, PharmD, BCPS, FNKF Clinical Pharmacist [place] Please utilize Amion for appropriate phone number to reach the unit pharmacist ([place])   06/22/2019,7:16 PM

## 2019-06-22 NOTE — H&P (Addendum)
History and Physical    Angela Wong LGX:[id number] DOB: 07/25/1940 DOA: 06/22/2019  PCP: [name], FNP   Patient coming from: Home  I have personally briefly reviewed patient's old medical records in [place]  Chief Complaint: Difficulty breathing  HPI: Angela Wong is a 79 y.o. female with medical history significant for atrial fibrillation, COPD, congestive cardiomyopathy, hypertension, asthma, regrowing Raynaud's and rheumatoid arthritis. Patient presented to the ED with complaints of increasing difficulty breathing, leg swelling, both of these have been ongoing issues for several weeks, but she reports over the past few days her breathing has worsened.  With any significant activity, she feels drained, exhausted with difficulty breathing, these symptoms resolve with rest.  She reports a cough over the past week. Her home dose of Bumex has been adjusted from alternating doses of 0.5 and 1 mg every day, to 1 and a 1/2 mg every day. She is currently on 1 mg of Bumex every day.  This has improved her lower extremity swelling.  Patient has chronic body pains involving most of her joints from rheumatoid arthritis, so she is unable to ambulate or fully take care of herself.  She reports she has not had a bath in several days.  Her daughter drops of groceries once a week, and she has a 31 year old granddaughter who is able to offer limited help.  ED Course: O2 sats greater than 98% on room air, temperature 98.5, blood pressure 120s to 150s.  WBC 8.5.  BNP 296.  Potassium 3.2.  D-dimer 0.65.  Chest x-ray showed cardiomegaly without acute disease.  40 mg Lasix given in the ED, hospitalist admit for further evaluation and management.  Review of Systems: As per HPI all other systems reviewed and negative.  Past Medical History:  Diagnosis Date   A-fib ([place])    Asthma    Cerebral vasculitis    Congestive dilated cardiomyopathy (HCC)    COPD (chronic obstructive pulmonary  disease) (HCC)    Fibromyalgia    Gastric polyp    GERD (gastroesophageal reflux disease)    Hiatal hernia    Hyperparathyroidism (HCC)    IBS (irritable bowel syndrome)    MVP (mitral valve prolapse)    Osteoarthritis    Ovarian cancer (HCC)    lymph node removal with hysterectomy   Raynaud's disease    RLS (restless legs syndrome)    Situational depression    Sjogren's syndrome ([place])    Vasculitis ([place])    Vitamin D deficiency     Past Surgical History:  Procedure Laterality Date   ABDOMINAL HYSTERECTOMY  1982   with right oophorectomy   APPENDECTOMY     BREAST BIOPSY Right    x 2   BREAST SURGERY     Biopsy   CARPAL TUNNEL RELEASE Right 1980   CARPAL TUNNEL RELEASE Left 2010   x 2   CATARACT EXTRACTION Bilateral    CESAREAN SECTION     x 3   KNEE SURGERY Left 2005   OOPHORECTOMY Left 1962   PACEMAKER INSERTION  09/15/2014   REFRACTIVE SURGERY Bilateral 2014   TUBAL LIGATION       reports that she has never smoked. She has never used smokeless tobacco. She reports that she does not drink alcohol or use drugs.  Allergies  Allergen Reactions   Cephalosporins Hives and Shortness Of Breath   Ciprofloxacin Hives and Shortness Of Breath   Diltiazem Shortness Of Breath    Swollen throat  Doxycycline Hives and Shortness Of Breath   Horse-Derived Products Anaphylaxis   Ketek [Telithromycin] Palpitations    Chest discomfort   Nitrofuran Derivatives Anaphylaxis and Hives    blisters   Nitrous Oxide Nausea And Vomiting    Severe due to Sjogrens (Auto-Immune Disease)   Other Anaphylaxis    ALLERGY TO HORSE SERUM   Penicillins Hives and Shortness Of Breath    Did it involve swelling of the face/tongue/throat, SOB, or low BP? Yes Did it involve sudden or severe rash/hives, skin peeling, or any reaction on the inside of your mouth or nose? No Did you need to seek medical attention at a hospital or doctor's office? Unknown When  did it last happen?childhood If all above answers are NO, may proceed with cephalosporin use.    Pentazocine Other (See Comments)    Other reaction(s): Mental Status Changes (intolerance) Altered Mental Status  Altered Mental Status  Altered Mental Status    Sulfa Antibiotics Hives and Shortness Of Breath   Trovan [Alatrofloxacin] Palpitations, Other (See Comments) and Anaphylaxis    Chest pain, dizziness, irregular pulse   Zinc Gelatin [Zinc] Anaphylaxis   Amlodipine Swelling   Calcium Channel Blockers     Respiratory distress   Carvedilol Other (See Comments)    Dizziness, "joint pain, depression"   Clindamycin/Lincomycin     CP, lock jaw   Codeine Nausea Only   Cymbalta [Duloxetine Hcl] Swelling   Diovan [Valsartan] Swelling   Lisinopril Swelling   Sertraline Other (See Comments)    confusion   Tramadol Nausea Only   Loteprednol Etabonate Rash    Family History  Problem Relation Age of Onset   COPD Mother    Breast cancer Mother    Allergic rhinitis Mother    Heart disease Father        No details   Kidney disease Father    Allergic rhinitis Father    Stroke Sister    Arthritis/Rheumatoid Sister    Asthma Sister    Lupus Sister    Heart attack Sister    Stroke Paternal Grandmother    Scleroderma Grandchild    Thyroid disease Other    Breast cancer Maternal Aunt        x 2    Prior to Admission medications   Medication Sig Start Date End Date Taking? Authorizing Provider  albuterol (VENTOLIN HFA) 108 (90 Base) MCG/ACT inhaler INHALE 2 PUFFS BY MOUTH EVERY 6 HOURS AS NEEDED FOR WHEEZING 08/10/18   Terald Sleeper, PA-C  atenolol (TENORMIN) 25 MG tablet Take 12.5 mg by mouth daily. *May take additional 12.5mg  daily as needed for A-Fib 03/11/16   [provider]  B Complex-C (B-COMPLEX WITH VITAMIN C) tablet Take 1 tablet by mouth every evening.     [provider]  Baclofen 5 MG TABS Take 2.5 mg by mouth 3 (three)  times daily as needed. 06/17/19   [name] A, FNP  bumetanide (BUMEX) 1 MG tablet Take 1 tablet (1 mg total) by mouth daily. TAKE 1 TABLET BY MOUTH EVERY OTHER DAY ON OPPOSITE DAY OF 0.5MG  DOSE 06/15/19   [name] A, FNP  Cholecalciferol (VITAMIN D) 2000 UNITS CAPS Take 2 capsules by mouth every evening.     [provider]  diphenhydrAMINE (BENADRYL) 25 MG tablet Take by mouth.    [provider]  docusate sodium (COLACE) 100 MG capsule Take 400 mg by mouth daily.     [provider]  esomeprazole ([place]) 40  MG capsule Take 1 capsule (40 mg total) by mouth 2 (two) times daily. 01/04/19   Pyrtle, Lajuan Lines, MD  fexofenadine (ALLEGRA) 180 MG tablet Take 180 mg by mouth every morning.     [provider]  folic acid (FOLVITE) 1 MG tablet Take 1 mg by mouth daily. 06/07/19   [provider]  Iron Polysacch Cmplx-B12-FA (POLY-IRON 150 FORTE) 150-0.025-1 MG CAPS Take 1 capsule by mouth daily. 01/04/19   [name], FNP  levothyroxine (EUTHYROX) 50 MCG tablet Take 2 tablets (100 mcg total) by mouth every morning. 01/04/19   [name], FNP  losartan (COZAAR) 100 MG tablet Take 1 tablet (100 mg total) by mouth daily. 01/04/19   [name] A, FNP  Magnesium 200 MG TABS Take 400 mg by mouth every evening.     [provider]  methotrexate 50 MG/2ML injection Inject 0.4 mLs into the muscle once a week. 06/12/19   [provider]  montelukast (SINGULAIR) 10 MG tablet Take 1 tablet (10 mg total) by mouth at bedtime. Patient not taking: Reported on 06/15/2019 06/18/18   [name] A, FNP  Olopatadine HCl 0.2 % SOLN Apply 1 drop to eye daily as needed (for allergy eye). 07/31/18   Valentina Shaggy, MD  oxyCODONE (ROXICODONE) 5 MG immediate release tablet Take 1 tablet (5 mg total) by mouth every 6 (six) hours as needed for severe pain. 06/10/19   [name] A, FNP  predniSONE (DELTASONE) 5 MG tablet Take 5 mg by mouth every  morning.    [provider]  PREVIDENT 5000 DRY MOUTH 1.1 % GEL dental gel Place 1 application onto teeth as needed (as needed for dry mouth).  10/13/13   [provider]  RESTASIS 0.05 % ophthalmic emulsion INSTILL 1 DROP INTO EACH EYE TWICE DAILY 11/30/18   [name] A, FNP  Simethicone (GAS-X EXTRA STRENGTH) 125 MG CAPS Take 2 capsules by mouth daily as needed (for relief).     [provider]  SYMBICORT 160-4.5 MCG/ACT inhaler Inhale 2 puffs by mouth twice daily 04/30/19   Valentina Shaggy, MD  traZODone (DESYREL) 150 MG tablet Take 1 tablet (150 mg total) by mouth at bedtime. 01/04/19   [name], FNP  vitamin B-12 (CYANOCOBALAMIN) 500 MCG tablet Take 1 tablet (500 mcg total) by mouth daily. 05/04/19   [name], FNP  warfarin (COUMADIN) 1 MG tablet TAKE 2 TO 4 TABLETS BY MOUTH ONCE DAILY AS DIRECTED BY  ANTICOAGULATION  CLINIC 01/04/19   [name], FNP    Physical Exam: Vitals:   06/22/19 1400 06/22/19 1430 06/22/19 1500 06/22/19 1530  BP: (!) 157/80 138/67 124/63 139/73  Pulse: 76 79 75 89  Resp:  (!) 31 19 16   Temp:      TempSrc:      SpO2: 98% 99% 99% 100%  Weight:      Height:        Constitutional: NAD, calm, comfortable Vitals:   06/22/19 1400 06/22/19 1430 06/22/19 1500 06/22/19 1530  BP: (!) 157/80 138/67 124/63 139/73  Pulse: 76 79 75 89  Resp:  (!) 31 19 16   Temp:      TempSrc:      SpO2: 98% 99% 99% 100%  Weight:      Height:       Eyes: PERRL, lids and conjunctivae normal ENMT: Mucous membranes are moist. Posterior pharynx clear of any exudate or lesions.Normal dentition.  Neck: normal,  supple, no masses, no thyromegaly Respiratory: clear to auscultation bilaterally, no wheezing, no crackles. Normal respiratory effort. No accessory muscle use.  Cardiovascular: Regular rate and rhythm, no murmurs / rubs / gallops.  1+ extremity edema Left worse than right, 2+ pedal pulses.  Pace maker right upper  chest. Abdomen: no tenderness, no masses palpated. No hepatosplenomegaly. Bowel sounds positive.  Musculoskeletal: no clubbing / cyanosis.  Mild Deformity of left hand consistent with rheumatoid arthritis, Normal muscle tone.  Skin: no rashes, lesions, ulcers. No induration Neurologic: Moving all extremities spontaneously.  No apparent cranial nerve abnormality. Psychiatric: Normal judgment and insight. Alert and oriented x 3. Normal mood.   Labs on Admission: I have personally reviewed following labs and imaging studies  CBC: Recent Labs  Lab 06/22/19 1410  WBC 8.5  HGB 13.2  HCT 38.7  MCV 92.1  PLT 329   Basic Metabolic Panel: Recent Labs  Lab 06/22/19 1410  NA 134*  K 3.2*  CL 93*  CO2 29  GLUCOSE 101*  BUN 17  CREATININE 0.74  CALCIUM 9.1  MG 2.0   Liver Function Tests: Recent Labs  Lab 06/22/19 1410  AST 17  ALT 15  ALKPHOS 76  BILITOT 1.0  PROT 6.5  ALBUMIN 3.8    Radiological Exams on Admission: DG Chest Portable 1 View  Result Date: 06/22/2019 CLINICAL DATA:  Shortness of breath and lower extremity swelling for the past few weeks. EXAM: PORTABLE CHEST 1 VIEW COMPARISON:  PA and lateral chest 03/19/2018 and CT chest 12/24/2017. FINDINGS: Pacing device is unchanged. Lungs are clear. Heart size is enlarged. No pneumothorax or pleural effusion. No acute bony abnormality. IMPRESSION: Cardiomegaly without acute disease. Electronically Signed   By: Inge Rise M.D.   On: 06/22/2019 14:57    EKG: Independently reviewed. Atria fib rate 93, Qtc 449, No significant change from prior.  Assessment/Plan Principal Problem:   Congestive dilated cardiomyopathy (HCC) Active Problems:   Atrial fibrillation, permanent (HCC)   Benign essential HTN   Dyspnea   Moderate persistent asthma without complication   Sjogren's syndrome (HCC)   Anticoagulation goal of INR 2 to 3   RA (rheumatoid arthritis) (HCC)   Decompensated systolic JJO-8+ bilateral lower extremity  edema, complaint with increased dose of bumex, BNP unremarkable at 296.  Portable chest x-ray shows cardiomegaly without acute disease.  D-dimer 0.65, within normal limits when adjusted for age, patient on warfarin, INR therapeutic at 2.  O2 sats 98% on room air.  No tachycardia.  Dyspnea likely secondary to increasing debility, deconditioning.  2019 shows EF of 50%, and diffuse hypokinesis. -Continue IV Lasix 40 every 12 hourly -Strict input output, daily weights -Daily BMP -Updated echocardiogram -Hold Bumex for now  Hypokalemia-potassium 3.2.  Likely due to diuretics.  Normal magnesium 2. -Replete -May need daily supplementation  Chronic pain, rheumatoid arthritis, Sjogren-chronic uncontrolled pain, does not appear to have acute flare of rheumatoid arthritis.  Has not taking her methotrexate recently due to wound on her buttock.  Reports muscle spasms, and that she did not tolerate baclofen. -Methocarbamol TID -Oxycodone 5 mg every 6 hourly as needed -Physical therapy evaluation - Resume home prednisone 5mg  daily - Patient open to the idea of temporary rehabilitation/SNF  COPD, asthma-stable, no wheezing or rhonchi. -DuoNebs as needed - Resume home symbicort  Hypertension- stable. -Home atenolol, losartan.  History of atrial fibrillation, pacemaker status-currently atrial fibrillation. -Resume anticoagulation with warfarin- pharmacy to dose -  resume atenolol.   DVT prophylaxis: Warfarin Code Status:  Full code Family Communication: Bedside Disposition Plan: ~ 2 days, Pending diuresis, PT evaluation, clear discharge to skilled nursing facility or at the least would need home health care. Consults called: None Admission status: Inpatient, telemetry I certify that at the point of admission it is my clinical judgment that the patient will require inpatient hospital care spanning beyond 2 midnights from the point of admission due to high intensity of service, high risk for further  deterioration and high frequency of surveillance required. The following factors support the patient status of inpatient: Decompensated CHF requiring IV diuresis.   [name] MD [facility]  06/22/2019, 7:11 PM

## 2019-06-22 NOTE — ED Triage Notes (Signed)
Pt reports sob and leg swelling for past few weeks.  Reports wound to buttocks.  Pt also reports has osteoarthritis and rheumatoid arthritis and is c/o pain in all of her joints.  Pt recently stopped methotrexate because she started getting red sores on her legs.

## 2019-06-23 ENCOUNTER — Inpatient Hospital Stay (HOSPITAL_COMMUNITY): Payer: Medicare Other

## 2019-06-23 DIAGNOSIS — I5033 Acute on chronic diastolic (congestive) heart failure: Secondary | ICD-10-CM

## 2019-06-23 DIAGNOSIS — Z5181 Encounter for therapeutic drug level monitoring: Secondary | ICD-10-CM

## 2019-06-23 DIAGNOSIS — Z7901 Long term (current) use of anticoagulants: Secondary | ICD-10-CM

## 2019-06-23 DIAGNOSIS — I4821 Permanent atrial fibrillation: Secondary | ICD-10-CM

## 2019-06-23 DIAGNOSIS — L89152 Pressure ulcer of sacral region, stage 2: Secondary | ICD-10-CM

## 2019-06-23 DIAGNOSIS — M3500 Sicca syndrome, unspecified: Secondary | ICD-10-CM

## 2019-06-23 DIAGNOSIS — J4540 Moderate persistent asthma, uncomplicated: Secondary | ICD-10-CM

## 2019-06-23 DIAGNOSIS — M0579 Rheumatoid arthritis with rheumatoid factor of multiple sites without organ or systems involvement: Secondary | ICD-10-CM

## 2019-06-23 LAB — BASIC METABOLIC PANEL
Anion gap: 8 (ref 5–15)
BUN: 15 mg/dL (ref 8–23)
CO2: 32 mmol/L (ref 22–32)
Calcium: 8.7 mg/dL — ABNORMAL LOW (ref 8.9–10.3)
Chloride: 96 mmol/L — ABNORMAL LOW (ref 98–111)
Creatinine, Ser: 0.71 mg/dL (ref 0.44–1.00)
GFR calc Af Amer: >60 mL/min (ref >60)
GFR calc non Af Amer: >60 mL/min (ref >60)
Glucose, Bld: 93 mg/dL (ref 70–99)
Potassium: 3 mmol/L — ABNORMAL LOW (ref 3.5–5.1)
Sodium: 136 mmol/L (ref 135–145)

## 2019-06-23 LAB — MAGNESIUM: Magnesium: 2 mg/dL (ref 1.7–2.4)

## 2019-06-23 LAB — PROTIME-INR
INR: 2.1 — ABNORMAL HIGH (ref 0.8–1.2)
Prothrombin Time: 22.9 seconds — ABNORMAL HIGH (ref 11.4–15.2)

## 2019-06-23 LAB — CORTISOL: Cortisol, Plasma: 4.4 ug/dL

## 2019-06-23 LAB — ECHOCARDIOGRAM COMPLETE
Height: 64 in
Weight: 2094.4 oz

## 2019-06-23 MED ORDER — POTASSIUM CHLORIDE CRYS ER 20 MEQ PO TBCR
40.0000 meq | EXTENDED_RELEASE_TABLET | ORAL | Status: AC
  Administered 2019-06-23 (×3): 40 meq via ORAL
  Filled 2019-06-23 (×3): qty 2

## 2019-06-23 MED ORDER — DOCUSATE SODIUM 100 MG PO CAPS
100.0000 mg | ORAL_CAPSULE | Freq: Two times a day (BID) | ORAL | Status: DC
  Administered 2019-06-23 – 2019-06-24 (×2): 100 mg via ORAL
  Filled 2019-06-23 (×2): qty 1

## 2019-06-23 MED ORDER — DIPHENHYDRAMINE HCL 25 MG PO CAPS
25.0000 mg | ORAL_CAPSULE | Freq: Three times a day (TID) | ORAL | Status: DC | PRN
  Administered 2019-06-23 – 2019-06-24 (×3): 25 mg via ORAL
  Filled 2019-06-23 (×3): qty 1

## 2019-06-23 MED ORDER — WARFARIN SODIUM 1 MG PO TABS
4.0000 mg | ORAL_TABLET | Freq: Once | ORAL | Status: AC
  Administered 2019-06-23: 4 mg via ORAL
  Filled 2019-06-23: qty 4

## 2019-06-23 NOTE — Progress Notes (Signed)
ANTICOAGULATION CONSULT NOTE -  Pharmacy Consult for warfarin Indication: atrial fibrillation  Allergies  Allergen Reactions  . Cephalosporins Hives and Shortness Of Breath  . Ciprofloxacin Hives and Shortness Of Breath  . Diltiazem Shortness Of Breath    Swollen throat   . Doxycycline Hives and Shortness Of Breath  . Horse-Derived Products Anaphylaxis  . Ketek [Telithromycin] Palpitations    Chest discomfort  . Nitrofuran Derivatives Anaphylaxis and Hives    blisters  . Nitrous Oxide Nausea And Vomiting    Severe due to Sjogrens (Auto-Immune Disease)  . Other Anaphylaxis    ALLERGY TO HORSE SERUM  . Penicillins Hives and Shortness Of Breath       . Pentazocine Other (See Comments)    Other reaction(s): Mental Status Changes (intolerance) Altered Mental Status  Altered Mental Status  Altered Mental Status   . Sulfa Antibiotics Hives and Shortness Of Breath  . Trovan [Alatrofloxacin] Palpitations, Other (See Comments) and Anaphylaxis    Chest pain, dizziness, irregular pulse  . Zinc Gelatin [Zinc] Anaphylaxis  . Amlodipine Swelling  . Calcium Channel Blockers     Respiratory distress  . Carvedilol Other (See Comments)    Dizziness, "joint pain, depression"  . Clindamycin/Lincomycin     CP, lock jaw  . Codeine Nausea Only  . Cymbalta [Duloxetine Hcl] Swelling  . Diovan [Valsartan] Swelling  . Lisinopril Swelling  . Sertraline Other (See Comments)    confusion  . Tramadol Nausea Only  . Loteprednol Etabonate Rash    Patient Measurements: Height: 5\' 4"  (162.6 cm) Weight: 59.4 kg (130 lb 14.4 oz) IBW/kg (Calculated) : 54.7   Vital Signs: Temp: 97.3 F (36.3 C) (06/09 0433) Temp Source: Oral (06/09 0433) BP: 111/72 (06/09 0433) Pulse Rate: 55 (06/09 0433)  Labs: Recent Labs    06/22/19 1410 06/22/19 1430 06/23/19 0603  HGB 13.2  --   --   HCT 38.7  --   --   PLT 225  --   --   LABPROT  --  21.5* 22.9*  INR  --  2.0* 2.1*  CREATININE 0.74  --  0.71     Estimated Creatinine Clearance: 49.2 mL/min (by C-G formula based on SCr of 0.71 mg/dL).   Medical History: Past Medical History:  Diagnosis Date  . A-fib ([place])   . Asthma   . Cerebral vasculitis   . Congestive dilated cardiomyopathy ([place])   . COPD (chronic obstructive pulmonary disease) ([place])   . Fibromyalgia   . Gastric polyp   . GERD (gastroesophageal reflux disease)   . Hiatal hernia   . Hyperparathyroidism ([place])   . IBS (irritable bowel syndrome)   . MVP (mitral valve prolapse)   . Osteoarthritis   . Ovarian cancer ([place])    lymph node removal with hysterectomy  . Raynaud's disease   . RLS (restless legs syndrome)   . Situational depression   . Sjogren's syndrome ([place])   . Vasculitis ([place])   . Vitamin D deficiency      Assessment: 79 yo female with h/o afib on warfarin admitted for difficulty breathing. PTA dose 4mg  MWF and 2mg  TTSS with last dose given 6/7. INR today 2.1. No bleeding noted  Goal of Therapy:  INR 2-3 Monitor platelets by anticoagulation protocol: Yes   Plan:  Warfarin 4 mg PO tonight  Daily INR Monitor for s/s of bleeding.  Angela Wong, PharmD Clinical Pharmacist 06/23/2019 8:40 AM

## 2019-06-23 NOTE — TOC Initial Note (Signed)
Transition of Care [facility]) - Initial/Assessment Note    Patient Details  Name: Angela Wong MRN: [id number] Date of Birth: 1940-03-11  Transition of Care [facility]) CM/SW Contact:    Ihor Gully, LCSW Phone Number: 06/23/2019, 2:20 PM  Clinical Narrative:                 Patient from home alone. Admitted for Afib, swelling, rheumatoid arthritis. Uses rollator at baseline.  ADLs were becoming more difficult over the past several weeks. Patient has not been able to bathe/shower in a couple weeks due to joint issues.  PT recommends SNF. Patient is agreeable. Referral sent to facilities requested.   Expected Discharge Plan: Skilled Nursing Facility Barriers to Discharge: No Barriers Identified   Patient Goals and CMS Choice Patient states their goals for this hospitalization and ongoing recovery are:: rehab then home   Choice offered to / list presented to : Patient  Expected Discharge Plan and Services Expected Discharge Plan: [place] Choice: [place] Living arrangements for the past 2 months: Single Family Home                                      Prior Living Arrangements/Services Living arrangements for the past 2 months: Single Family Home Lives with:: Self Patient language and need for interpreter reviewed:: Yes Do you feel safe going back to the place where you live?: Yes      Need for Family Participation in Patient Care: Yes (Comment) Care giver support system in place?: Yes (comment) Current home services: DME(rollator) Criminal Activity/Legal Involvement Pertinent to Current Situation/Hospitalization: No - Comment as needed  Activities of Daily Living Home Assistive Devices/Equipment: Eyeglasses, Shower chair with back, Scales ADL Screening (condition at time of admission) Patient's cognitive ability adequate to safely complete daily activities?: Yes Is the patient deaf or have difficulty hearing?:  No Does the patient have difficulty seeing, even when wearing glasses/contacts?: Yes Does the patient have difficulty concentrating, remembering, or making decisions?: Yes Patient able to express need for assistance with ADLs?: Yes Does the patient have difficulty dressing or bathing?: Yes Independently performs ADLs?: No(needs assistance, lives alone. requesting home assistance) Communication: Independent Dressing (OT): Needs assistance Is this a change from baseline?: Pre-admission baseline Grooming: Independent Feeding: Independent Bathing: Needs assistance Is this a change from baseline?: Pre-admission baseline Toileting: Needs assistance Is this a change from baseline?: Pre-admission baseline In/Out Bed: Needs assistance Is this a change from baseline?: Pre-admission baseline Walks in Home: Independent with device (comment) Does the patient have difficulty walking or climbing stairs?: Yes Weakness of Legs: Both Weakness of Arms/Hands: Both  Permission Sought/Granted                  Emotional Assessment Appearance:: Appears stated age   Affect (typically observed): Appropriate Orientation: : Oriented to Self, Oriented to Place, Oriented to  Time, Oriented to Situation Alcohol / Substance Use: Not Applicable Psych Involvement: No (comment)  Admission diagnosis:  Dyspnea [R06.00] Congestive heart failure, unspecified HF chronicity, unspecified heart failure type ([place]) [I50.9] Decompensated heart failure (HCC) [I50.9] Patient Active Problem List   Diagnosis Date Noted   Decompensated heart failure ([place]) 06/22/2019   Pressure injury of skin 06/22/2019   RA (rheumatoid arthritis) ([place]) 05/04/2019   Anticoagulation goal of INR 2 to 3 12/18/2017   Depression, major, single episode, moderate (  [place]) 09/12/2017   Chest pain 08/25/2017   Generalized weakness 08/25/2017   Seasonal and perennial allergic rhinitis 12/31/2016   Allergic rhinitis with a nonallergic  component 10/01/2016   Itching 10/01/2016   Skeeter syndrome 10/01/2016   Steroid-induced osteopenia 07/11/2016   Hyperparathyroidism ([place]) 01/16/2016   Iron deficiency anemia 12/18/2015   Insomnia 12/18/2015   Hypothyroidism 12/18/2015   Rotator cuff tear 03/23/2014   Osteoarthritis    Moderate persistent asthma without complication    Ovarian cancer ([place])    Fibromyalgia    RLS (restless legs syndrome)    Cerebral vasculitis    Sjogren's syndrome ([place])    Raynaud's disease    MVP (mitral valve prolapse)    IBS (irritable bowel syndrome)    Congestive dilated cardiomyopathy ([place]) 02/09/2014   Dyspnea 12/13/2013   Hyperlipidemia 11/26/2013   Malaise and fatigue 11/26/2013   Allergy to multiple antibiotics 11/02/2013   History of ovarian cancer 10/01/2013   GERD (gastroesophageal reflux disease) 10/01/2013   Benign essential HTN 10/01/2013   Atrial fibrillation, permanent ([place]) 07/27/2013   PCP:  [name], FNP Pharmacy:   [facility] [address], [address] [place] [address] Phone: [phone] Fax: [phone]     Social Determinants of Health (SDOH) Interventions    Readmission Risk Interventions No flowsheet data found.

## 2019-06-23 NOTE — Progress Notes (Signed)
*  PRELIMINARY RESULTS* Echocardiogram 2D Echocardiogram has been performed.  Angela Wong 06/23/2019, 11:37 AM

## 2019-06-23 NOTE — NC FL2 (Signed)
[place] LEVEL OF CARE SCREENING TOOL     IDENTIFICATION  Patient Name: Angela Wong Birthdate: [birth year] Sex: female Admission Date (Current Location): 06/22/2019  [facility] and [place] Number:  [employer] and Address:  [place] [address], [place]      Provider Number: [id number]  Attending Physician Name and Address:  Barton Dubois, MD  Relative Name and Phone Number:  [place], [place]) [phone]    Current Level of Care: Hospital Recommended Level of Care: [place] Prior Approval Number:    Date Approved/Denied:   PASRR Number: [id number]  Discharge Plan: SNF    Current Diagnoses: Patient Active Problem List   Diagnosis Date Noted  . Decompensated heart failure ([place]) 06/22/2019  . Pressure injury of skin 06/22/2019  . RA (rheumatoid arthritis) ([place]) 05/04/2019  . Anticoagulation goal of INR 2 to 3 12/18/2017  . Depression, major, single episode, moderate ([place]) 09/12/2017  . Chest pain 08/25/2017  . Generalized weakness 08/25/2017  . Seasonal and perennial allergic rhinitis 12/31/2016  . Allergic rhinitis with a nonallergic component 10/01/2016  . Itching 10/01/2016  . 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 Steroid-induced osteopenia 07/11/2016  . Hyperparathyroidism ([place]) 01/16/2016  . Iron deficiency anemia 12/18/2015  . Insomnia 12/18/2015  . Hypothyroidism 12/18/2015  . Rotator cuff tear 03/23/2014  . Osteoarthritis   . Moderate persistent asthma without complication   . Ovarian cancer ([place])   . Fibromyalgia   . RLS (restless legs syndrome)   . Cerebral vasculitis   . Sjogren's syndrome ([place])   . Raynaud's disease   . MVP (mitral valve prolapse)   . IBS (irritable bowel syndrome)   . Congestive dilated cardiomyopathy ([place]) 02/09/2014  . Dyspnea 12/13/2013  . Hyperlipidemia 11/26/2013  . Malaise and fatigue 11/26/2013  . Allergy to multiple antibiotics 11/02/2013  . History  of ovarian cancer 10/01/2013  . GERD (gastroesophageal reflux disease) 10/01/2013  . Benign essential HTN 10/01/2013  . Atrial fibrillation, permanent ([place]) 07/27/2013    Orientation RESPIRATION BLADDER Height & Weight     Self, Time, Situation, Place  Normal Continent Weight: 130 lb 14.4 oz (59.4 kg) Height:  5\' 4"  (162.6 cm)  BEHAVIORAL SYMPTOMS/MOOD NEUROLOGICAL BOWEL NUTRITION STATUS      Continent Diet(heart healthy)  AMBULATORY STATUS COMMUNICATION OF NEEDS Skin   Limited Assist Verbally PU Stage and Appropriate Care(Stage 2: sacrum, left)                       Personal Care Assistance Level of Assistance  Bathing, Dressing, Feeding Bathing Assistance: Limited assistance Feeding assistance: Independent Dressing Assistance: Limited assistance     Functional Limitations Info  Sight, Hearing, Speech Sight Info: Adequate Hearing Info: Adequate Speech Info: Adequate    SPECIAL CARE FACTORS FREQUENCY  PT (By licensed PT)     PT Frequency: 5x/week              Contractures Contractures Info: Not present    Additional Factors Info  Code Status, Allergies, Psychotropic Code Status Info: Full Allergies Info: Cephalosporins, Ciprofloxacin, Diltiazem, Doxycycline, Horse-derived products, Ketek, Nitrofuran Derivatives, Nitrous Oxide, Horse serum, Penicillins, Pentazocine, Sulfa Antibiotics, Trovan, Zinc Gelatin, Amlodipine, Calcium Channel Blockers, Carvedilol, Clindamycin/lincomycin, Codeine, Cymbalta, Diovan, Lisinopril, Sertraline, Tramadol, Loteprednol Etadonate Psychotropic Info: Desyrel         Current Medications (06/23/2019):  This is the current hospital active medication list Current Facility-Administered Medications  Medication Dose Route Frequency Provider Last Rate  Last Admin  . acetaminophen (TYLENOL) tablet 650 mg  650 mg Oral Q6H PRN Emokpae, Ejiroghene E, MD       Or  . acetaminophen (TYLENOL) suppository 650 mg  650 mg Rectal Q6H PRN Emokpae,  Ejiroghene E, MD      . atenolol (TENORMIN) tablet 12.5 mg  12.5 mg Oral Daily Emokpae, Ejiroghene E, MD   12.5 mg at 06/23/19 0922  . cycloSPORINE (RESTASIS) 0.05 % ophthalmic emulsion 1 drop  1 drop Both Eyes BID Emokpae, Ejiroghene E, MD   1 drop at 06/23/19 0921  . furosemide (LASIX) injection 40 mg  40 mg Intravenous Q12H Emokpae, Ejiroghene E, MD   40 mg at 06/23/19 0919  . ipratropium-albuterol (DUONEB) 0.5-2.5 (3) MG/3ML nebulizer solution 3 mL  3 mL Nebulization Q4H PRN Emokpae, Ejiroghene E, MD      . levothyroxine (SYNTHROID) tablet 100 mcg  100 mcg Oral Q0600 Emokpae, Ejiroghene E, MD   100 mcg at 06/23/19 0502  . loratadine (CLARITIN) tablet 10 mg  10 mg Oral Daily Emokpae, Ejiroghene E, MD      . losartan (COZAAR) tablet 100 mg  100 mg Oral Daily Emokpae, Ejiroghene E, MD   100 mg at 06/23/19 0923  . magnesium oxide (MAG-OX) tablet 400 mg  400 mg Oral QPM Emokpae, Ejiroghene E, MD   400 mg at 06/22/19 2114  . methocarbamol (ROBAXIN) tablet 500 mg  500 mg Oral TID Emokpae, Ejiroghene E, MD   500 mg at 06/23/19 0924  . mometasone-formoterol (DULERA) 200-5 MCG/ACT inhaler 2 puff  2 puff Inhalation BID Emokpae, Ejiroghene E, MD   2 puff at 06/23/19 0754  . ondansetron (ZOFRAN) tablet 4 mg  4 mg Oral Q6H PRN Emokpae, Ejiroghene E, MD       Or  . ondansetron (ZOFRAN) injection 4 mg  4 mg Intravenous Q6H PRN Emokpae, Ejiroghene E, MD      . oxyCODONE-acetaminophen (PERCOCET/ROXICET) 5-325 MG per tablet 1 tablet  1 tablet Oral Q6H PRN Emokpae, Ejiroghene E, MD   1 tablet at 06/23/19 0926  . pantoprazole (PROTONIX) EC tablet 40 mg  40 mg Oral Daily Emokpae, Ejiroghene E, MD      . polyethylene glycol (MIRALAX / GLYCOLAX) packet 17 g  17 g Oral Daily PRN Emokpae, Ejiroghene E, MD      . potassium chloride SA (KLOR-CON) CR tablet 40 mEq  40 mEq Oral Q4H Barton Dubois, MD   40 mEq at 06/23/19 0937  . predniSONE (DELTASONE) tablet 5 mg  5 mg Oral q morning - 10a Emokpae, Ejiroghene E, MD   5 mg  at 06/23/19 0924  . traZODone (DESYREL) tablet 150 mg  150 mg Oral QHS Emokpae, Ejiroghene E, MD   150 mg at 06/22/19 2114  . warfarin (COUMADIN) tablet 4 mg  4 mg Oral ONCE-1600 Barton Dubois, MD      . Warfarin - Pharmacist Dosing Inpatient   Does not apply q1600 Theotis Burrow, [facility]         Discharge Medications: Please see discharge summary for a list of discharge medications.  Relevant Imaging Results:  Relevant Lab Results:   Additional Information SSN [id number] [address] Clydene Pugh, LCSW

## 2019-06-23 NOTE — Plan of Care (Signed)
  Problem: Acute Rehab PT Goals(only PT should resolve) Goal: Pt Will Go Supine/Side To Sit Outcome: Progressing Flowsheets (Taken 06/23/2019 1242) Pt will go Supine/Side to Sit:  with minimal assist  with HOB elevated  with moderate assist Goal: Pt Will Go Sit To Supine/Side Outcome: Progressing Flowsheets (Taken 06/23/2019 1242) Pt will go Sit to Supine/Side:  with minimal assist  with moderate assist  with HOB  elevated Goal: Patient Will Transfer Sit To/From Stand Outcome: Progressing Flowsheets (Taken 06/23/2019 1242) Patient will transfer sit to/from stand:  with minimal assist  with moderate assist Goal: Pt Will Transfer Bed To Chair/Chair To Bed Outcome: Progressing Flowsheets (Taken 06/23/2019 1242) Pt will Transfer Bed to Chair/Chair to Bed:  with min assist  with mod assist Goal: Pt Will Ambulate Outcome: Progressing Flowsheets (Taken 06/23/2019 1242) Pt will Ambulate:  10 feet  with minimal assist  with moderate assist  with least restrictive assistive device   Pamala Hurry D. Hartnett-Rands, MS, PT Per [place] [phone] 06/23/2019

## 2019-06-23 NOTE — Progress Notes (Signed)
Stated felt shaky and weak, with feeling of chest pressure around 0900.  Checked vitals and were stable.  Contacted Dr. Dyann Kief and he will come and see patient.  Gave scheduled meds and patient able to talk and converse about her health issues and where she had lived , etc.  Refuses ted hose.

## 2019-06-23 NOTE — Progress Notes (Signed)
C/O having hives and itching.  No hives noted but contacted Dr. Dyann Kief and received  order for  benadryl 25 mg.  Refuses ted hose.

## 2019-06-23 NOTE — Progress Notes (Signed)
PROGRESS NOTE    Angela Wong  MHD:[id number] DOB: Dec 07, 1940 DOA: 06/22/2019 PCP: [name], FNP    Chief Complaint  Patient presents with  . Shortness of Breath  . Leg Swelling    Brief Narrative:  As per H&P written by Dr. Denton Brick on 06/22/2019 79 y.o. female with medical history significant for atrial fibrillation, COPD, congestive cardiomyopathy, hypertension, asthma, regrowing Raynaud's and rheumatoid arthritis. Patient presented to the ED with complaints of increasing difficulty breathing, leg swelling, both of these have been ongoing issues for several weeks, but she reports over the past few days her breathing has worsened.  With any significant activity, she feels drained, exhausted with difficulty breathing, these symptoms resolve with rest.  She reports a cough over the past week. Her home dose of Bumex has been adjusted from alternating doses of 0.5 and 1 mg every day, to 1 and a 1/2 mg every day. She is currently on 1 mg of Bumex every day.  This has improved her lower extremity swelling.  Patient has chronic body pains involving most of her joints from rheumatoid arthritis, so she is unable to ambulate or fully take care of herself.  She reports she has not had a bath in several days.  Her daughter drops of groceries once a week, and she has a 32 year old granddaughter who is able to offer limited help.  ED Course: O2 sats greater than 98% on room air, temperature 98.5, blood pressure 120s to 150s.  WBC 8.5.  BNP 296.  Potassium 3.2.  D-dimer 0.65.  Chest x-ray showed cardiomegaly without acute disease.  40 mg Lasix given in the ED, hospitalist admit for further evaluation and management.  Assessment & Plan: 1-congestive dilated cardiomyopathy ([place]): With acute on chronic diastolic heart failure -Continue daily weights and low-sodium diet -Follow strict I's and O's -Place TED hoses on both legs bilaterally -Continue IV diuresis -Follow urine output, renal function,  electrolytes and clinical response closely. -Still with signs of fluid overload on physical exam.  (Improving).  2-chronic atrial fibrillation, permanent (HCC) -Rate control -Continue warfarin for secondary prevention, pharmacy to dose. -Continue current dose of beta-blockers. -Continue telemetry monitoring -Make sure potassium and magnesium are within normal limits.  3-Benign essential HTN -Stable and well-controlled -Continue monitoring vital signs and adjust antihypertensive regimen as required. -Advised to follow heart healthy diet.  4-Moderate persistent asthma without complication -Stable currently -No wheezing on exam -Continue current maintenance and rescue bronchodilator regimen.  5-Rheumatoid arthritis/Sjogren's syndrome/physical deconditioning ([place]) -Continue adjusted dose of daily prednisone -Continue Robaxin -Continue as needed analgesics -Physical therapy has seen patient and recommendations given for skilled nursing facility for further care and rehabilitation.  6-chronic Anticoagulation goal of INR 2 to 3 -Secondary prevention for atrial fibrillation history. -Patient chronic use of anticoagulation will provide DVT prophylaxis as well.  7-left sacrum pressure injury of skin stage II -Present on admission -Continue preventive measures -No sign of superimposed infection.   DVT prophylaxis: Chronically on warfarin Code Status: Full code Family Communication: No family at bedside; patient updated and all questions answered; she expressed to pass messages to family members as needed. Disposition:   Status is: Inpatient  Dispo: The patient is from: Home              Anticipated d/c is to: Skilled nursing facility              Anticipated d/c date is: 6/11>>>6/12  Patient currently not medically stable for discharge; still with signs of fluid overload on physical examination and significantly weak/deconditioned making her unsafe to go home without  proper rehabilitation and conditioning first.  Physical therapy recommending a skilled nursing facility at discharge.  Continue IV diuresis.       Consultants:   None    Procedures:  See below for x-ray reports  2D echo: 1. Left ventricular ejection fraction, by estimation, is 50%. The left  ventricle has low normal function. The left ventricle demonstrates global  hypokinesis. There is mild left ventricular hypertrophy. Left ventricular  diastolic parameters are  indeterminate.  2. Right ventricular systolic function is normal. The right ventricular  size is normal. There is normal pulmonary artery systolic pressure. The  estimated right ventricular systolic pressure is 19.5 mmHg.  3. Left atrial size was moderately dilated.  4. The mitral valve is grossly normal with mild annular calcification.  Mild mitral valve regurgitation.  5. The aortic valve is tricuspid. Aortic valve regurgitation is not  visualized. Mild aortic valve sclerosis is present, with no evidence of  aortic valve stenosis.  6. The inferior vena cava is normal in size with greater than 50%  respiratory variability, suggesting right atrial pressure of 3 mmHg.    Antimicrobials:  None   Subjective: Feeling weak, deconditioned and easily winded.  Still with signs of fluid overload on her legs and complaining of generalized pain.  Objective: Vitals:   06/23/19 0433 06/23/19 0500 06/23/19 0754 06/23/19 0830  BP: 111/72   115/69  Pulse: (!) 55   63  Resp: 18   17  Temp: (!) 97.3 F (36.3 C)   (!) 97.5 F (36.4 C)  TempSrc: Oral   Oral  SpO2: 96%  99% 98%  Weight:  59.4 kg    Height:        Intake/Output Summary (Last 24 hours) at 06/23/2019 1752 Last data filed at 06/23/2019 1300 Gross per 24 hour  Intake 720 ml  Output 1050 ml  Net -330 ml   Filed Weights   06/22/19 1356 06/22/19 2034 06/23/19 0500  Weight: 57.6 kg 58.6 kg 59.4 kg    Examination:  General exam: Appears calm and  comfortable, reports no chest pain and a stable breathing.  Still with signs of fluid overload and feeling significantly weak and deconditioned.  No nausea or vomiting. Respiratory system: Good air movement bilaterally, no wheezing, normal respiratory effort and no using accessory muscles.  Good O2 sat on room air. Cardiovascular system: Rate controlled, no rubs, no gallops, no JVD on exam. Gastrointestinal system: Abdomen is nondistended, soft and nontender. No organomegaly or masses felt. Normal bowel sounds heard. Central nervous system: Alert and oriented. No focal neurological deficits. Extremities: No cyanosis or clubbing.  2+ lower extremity edema appreciated bilaterally. Skin: No petechiae; stage II left sacrum pressure injury appreciated at time of admission; no signs of superimposed infection seen. Psychiatry: Judgement and insight appear normal. Mood & affect appropriate.     Data Reviewed: I have personally reviewed following labs and imaging studies  CBC: Recent Labs  Lab 06/22/19 1410  WBC 8.5  HGB 13.2  HCT 38.7  MCV 92.1  PLT 093    Basic Metabolic Panel: Recent Labs  Lab 06/22/19 1410 06/23/19 0603  NA 134* 136  K 3.2* 3.0*  CL 93* 96*  CO2 29 32  GLUCOSE 101* 93  BUN 17 15  CREATININE 0.74 0.71  CALCIUM 9.1 8.7*  MG 2.0 2.0  GFR: Estimated Creatinine Clearance: 49.2 mL/min (by C-G formula based on SCr of 0.71 mg/dL).  Liver Function Tests: Recent Labs  Lab 06/22/19 1410  AST 17  ALT 15  ALKPHOS 76  BILITOT 1.0  PROT 6.5  ALBUMIN 3.8    CBG: No results for input(s): GLUCAP in the last 168 hours.   Recent Results (from the past 240 hour(s))  SARS Coronavirus 2 by RT PCR (hospital order, performed in [facility] hospital lab) Nasopharyngeal Nasopharyngeal Swab     Status: None   Collection Time: 06/22/19  4:07 PM   Specimen: Nasopharyngeal Swab  Result Value Ref Range Status   SARS Coronavirus 2 NEGATIVE NEGATIVE Final    Comment:  (NOTE) SARS-CoV-2 target nucleic acids are NOT DETECTED. The SARS-CoV-2 RNA is generally detectable in upper and lower respiratory specimens during the acute phase of infection. The lowest concentration of SARS-CoV-2 viral copies this assay can detect is 250 copies / mL. A negative result does not preclude SARS-CoV-2 infection and should not be used as the sole basis for treatment or other patient management decisions.  A negative result may occur with improper specimen collection / handling, submission of specimen other than nasopharyngeal swab, presence of viral mutation(s) within the areas targeted by this assay, and inadequate number of viral copies (<250 copies / mL). A negative result must be combined with clinical observations, patient history, and epidemiological information. Fact Sheet for Patients:   StrictlyIdeas.no Fact Sheet for Healthcare Providers: BankingDealers.co.za This test is not yet approved or cleared  by the [place] FDA and has been authorized for detection and/or diagnosis of SARS-CoV-2 by FDA under an Emergency Use Authorization (EUA).  This EUA will remain in effect (meaning this test can be used) for the duration of the COVID-19 declaration under Section 564(b)(1) of the Act, 21 U.S.C. section 360bbb-3(b)(1), unless the authorization is terminated or revoked sooner. Performed at [facility], [address], [place], [place] [address]      Radiology Studies: DG Chest Portable 1 View  Result Date: 06/22/2019 CLINICAL DATA:  Shortness of breath and lower extremity swelling for the past few weeks. EXAM: PORTABLE CHEST 1 VIEW COMPARISON:  PA and lateral chest 03/19/2018 and CT chest 12/24/2017. FINDINGS: Pacing device is unchanged. Lungs are clear. Heart size is enlarged. No pneumothorax or pleural effusion. No acute bony abnormality. IMPRESSION: Cardiomegaly without acute disease. Electronically Signed   By:  Inge Rise M.D.   On: 06/22/2019 14:57   ECHOCARDIOGRAM COMPLETE  Result Date: 06/23/2019    ECHOCARDIOGRAM REPORT   Patient Name:   Angela Wong Date of Exam: 06/23/2019 Medical Rec #:  [id number]       Height:       64.0 in Accession #:    [id number]      Weight:       130.9 lb Date of Birth:  1940-08-13      BSA:          1.634 m Patient Age:    41 years        BP:           115/69 mmHg Patient Gender: F               HR:           63 bpm. Exam Location:  [facility] Procedure: 2D Echo Indications:    Congestive Heart Failure 428.0 / I50.9  History:        Patient has prior history  of Echocardiogram examinations, most                 recent 08/26/2017. Arrythmias:Atrial Fibrillation,                 Signs/Symptoms:Dyspnea; Risk Factors:Hypertension and                 Dyslipidemia. MVP (mitral valve prolapse, GERD, History of                 ovarian cancer, RA (rheumatoid arthritis.  Sonographer:    Leavy Cella RDCS (AE) Referring Phys: [place]  1. Left ventricular ejection fraction, by estimation, is 50%. The left ventricle has low normal function. The left ventricle demonstrates global hypokinesis. There is mild left ventricular hypertrophy. Left ventricular diastolic parameters are indeterminate.  2. Right ventricular systolic function is normal. The right ventricular size is normal. There is normal pulmonary artery systolic pressure. The estimated right ventricular systolic pressure is 90.2 mmHg.  3. Left atrial size was moderately dilated.  4. The mitral valve is grossly normal with mild annular calcification. Mild mitral valve regurgitation.  5. The aortic valve is tricuspid. Aortic valve regurgitation is not visualized. Mild aortic valve sclerosis is present, with no evidence of aortic valve stenosis.  6. The inferior vena cava is normal in size with greater than 50% respiratory variability, suggesting right atrial pressure of 3 mmHg. FINDINGS  Left Ventricle: Left  ventricular ejection fraction, by estimation, is 50%. The left ventricle has low normal function. The left ventricle demonstrates global hypokinesis. The left ventricular internal cavity size was normal in size. There is mild left ventricular hypertrophy. Left ventricular diastolic parameters are indeterminate. Right Ventricle: The right ventricular size is normal. No increase in right ventricular wall thickness. Right ventricular systolic function is normal. There is normal pulmonary artery systolic pressure. The tricuspid regurgitant velocity is 1.83 m/s, and  with an assumed right atrial pressure of 3 mmHg, the estimated right ventricular systolic pressure is 40.9 mmHg. Left Atrium: Left atrial size was moderately dilated. Right Atrium: Right atrial size was normal in size. Pericardium: There is no evidence of pericardial effusion. Presence of pericardial fat pad. Mitral Valve: The mitral valve is grossly normal. Mild mitral annular calcification. Mild mitral valve regurgitation. Tricuspid Valve: The tricuspid valve is grossly normal. Tricuspid valve regurgitation is trivial. Aortic Valve: The aortic valve is tricuspid. Aortic valve regurgitation is not visualized. Mild aortic valve sclerosis is present, with no evidence of aortic valve stenosis. Mild aortic valve annular calcification. Pulmonic Valve: The pulmonic valve was grossly normal. Pulmonic valve regurgitation is trivial. Aorta: The aortic root is normal in size and structure. Venous: The inferior vena cava is normal in size with greater than 50% respiratory variability, suggesting right atrial pressure of 3 mmHg. IAS/Shunts: No atrial level shunt detected by color flow Doppler.  LEFT VENTRICLE PLAX 2D LVIDd:         5.06 cm  Diastology LVIDs:         3.76 cm  LV e' lateral:   9.32 cm/s LV PW:         1.22 cm  LV E/e' lateral: 10.4 LV IVS:        0.94 cm  LV e' medial:    6.49 cm/s LVOT diam:     2.00 cm  LV E/e' medial:  14.9 LVOT Area:     3.14 cm   RIGHT VENTRICLE RV S prime:     10.40 cm/s  TAPSE (M-mode): 1.6 cm LEFT ATRIUM             Index       RIGHT ATRIUM           Index LA diam:        4.60 cm 2.82 cm/m  RA Area:     20.70 cm LA Vol (A2C):   68.3 ml 41.80 ml/m RA Volume:   59.90 ml  36.66 ml/m LA Vol (A4C):   75.3 ml 46.09 ml/m LA Biplane Vol: 71.6 ml 43.82 ml/m   AORTA Ao Root diam: 2.70 cm MITRAL VALVE               TRICUSPID VALVE MV Area (PHT): 3.48 cm    TR Peak grad:   13.4 mmHg MV Decel Time: 218 msec    TR Vmax:        183.00 cm/s MR Peak grad: 28.9 mmHg MR Vmax:      269.00 cm/s  SHUNTS MV E velocity: 97.00 cm/s  Systemic Diam: 2.00 cm MV A velocity: 23.80 cm/s MV E/A ratio:  4.08 Rozann Lesches MD Electronically signed by Rozann Lesches MD Signature Date/Time: 06/23/2019/12:11:59 PM    Final     Scheduled Meds: . atenolol  12.5 mg Oral Daily  . cycloSPORINE  1 drop Both Eyes BID  . docusate sodium  100 mg Oral BID  . furosemide  40 mg Intravenous Q12H  . levothyroxine  100 mcg Oral Q0600  . loratadine  10 mg Oral Daily  . losartan  100 mg Oral Daily  . magnesium oxide  400 mg Oral QPM  . methocarbamol  500 mg Oral TID  . mometasone-formoterol  2 puff Inhalation BID  . pantoprazole  40 mg Oral Daily  . predniSONE  5 mg Oral q morning - 10a  . traZODone  150 mg Oral QHS  . Warfarin - Pharmacist Dosing Inpatient   Does not apply q1600   Continuous Infusions:   LOS: 1 day    Time spent: 30 minutes    Barton Dubois, MD [facility]   To contact the attending provider between 7A-7P or the covering provider during after hours 7P-7A, please log into the web site [email or web address] and access using universal [place] password for that web site. If you do not have the password, please call the hospital operator.  06/23/2019, 5:52 PM

## 2019-06-23 NOTE — Evaluation (Signed)
Physical Therapy Evaluation Patient Details Name: Angela Wong MRN: [id number] DOB: [birth year] Today's Date: 06/23/2019   History of Present Illness  Angela Wong is a 79 y.o. female with medical history significant for atrial fibrillation, COPD, congestive cardiomyopathy, hypertension, asthma, regrowing Raynaud's and rheumatoid arthritis.Patient presented to the ED with complaints of increasing difficulty breathing, leg swelling, both of these have been ongoing issues for several weeks, but she reports over the past few days her breathing has worsened.  With any significant activity, she feels drained, exhausted with difficulty breathing, these symptoms resolve with rest.  She reports a cough over the past week.Her home dose of Bumex has been adjusted from alternating doses of 0.5 and 1 mg every day, to 1 and a 1/2 mg every day. She is currently on 1 mg of Bumex every day.  This has improved her lower extremity swelling. Patient has chronic body pains involving most of her joints from rheumatoid arthritis, so she is unable to ambulate or fully take care of herself.  She reports she has not had a bath in several days.  Her daughter drops of groceries once a week, and she has a 10 year old granddaughter who is able to offer limited help.    Clinical Impression  Pt admitted with above diagnosis. Patient agreeable to participating in PT evaluation today. Patient significantly limited by pain with movement. Significant crepitus throughout body noted with all movement and weight bearing activities. Patient requires min to mod assist for all activities.  Patient exhibits significant trunk/spinal flexion in supine, seated and standing positions. Pt currently with functional limitations due to the deficits listed below (see PT Problem List). Pt will benefit from skilled PT to increase their independence and safety with mobility to allow discharge to the venue listed below.       Follow Up Recommendations  SNF;Supervision/Assistance - 24 hour;Supervision for mobility/OOB    Equipment Recommendations  None recommended by PT    Recommendations for Other Services       Precautions / Restrictions Precautions Precautions: Fall Restrictions Weight Bearing Restrictions: No      Mobility  Bed Mobility Overal bed mobility: Needs Assistance Bed Mobility: Supine to Sit     Supine to sit: Mod assist;Min assist     General bed mobility comments: significant pain complaints with movement and assistance with lower extremities  Transfers Overall transfer level: Needs assistance Equipment used: Rolling walker (2 wheeled) Transfers: Sit to/from [facility] Sit to Stand: Min assist;Mod assist Stand pivot transfers: Min assist;Mod assist       General transfer comment: very slow, labored gait; assistance primarily for steadying and to allow patient to move past the significant crepitus and pain in her joints; limited by pain  Ambulation/Gait Ambulation/Gait assistance: Mod assist Gait Distance (Feet): 4 Feet Assistive device: Rolling walker (2 wheeled) Gait Pattern/deviations: Step-to pattern;Decreased step length - right;Decreased step length - left;Decreased stride length;Trunk flexed Gait velocity: decreased   General Gait Details: very slow, labored gait; assistance primarily for steadying and to allow patient to move past the significant crepitus and pain in her joints; limited by pain  Stairs            Wheelchair Mobility    Modified Rankin (Stroke Patients Only)       Balance Overall balance assessment: Needs assistance Sitting-balance support: Bilateral upper extremity supported;Feet supported Sitting balance-Leahy Scale: Good     Standing balance support: Bilateral upper extremity supported;During functional activity Standing balance-Leahy Scale: Poor Standing balance  comment: with RW                             Pertinent  Vitals/Pain Pain Assessment: 0-10 Pain Score: 3  Pain Location: low back Pain Descriptors / Indicators: Dull Pain Intervention(s): Limited activity within patient's tolerance;Monitored during session(per patient report, received pain medicine approximately 4 hours ago.)    Home Living Family/patient expects to be discharged to:: Private residence Living Arrangements: Alone Available Help at Discharge: Family;Available PRN/intermittently(daughter-in-law and granddaughter able to help minimally) Type of Home: House Home Access: Level entry     Home Layout: Two level Home Equipment: Walker - 4 wheels;Cane - single point;Walker - standard;Tub bench;Grab bars - tub/shower;Grab bars - toilet      Prior Function Level of Independence: Needs assistance   Gait / Transfers Assistance Needed: 4-wheeled walker household distances with weight through her arms  ADL's / Homemaking Assistance Needed: Needs assistance for bathing and dressing; assistance for IADLs        Hand Dominance   Dominant Hand: Left    Extremity/Trunk Assessment   Upper Extremity Assessment Upper Extremity Assessment: Generalized weakness    Lower Extremity Assessment Lower Extremity Assessment: Generalized weakness    Cervical / Trunk Assessment Cervical / Trunk Assessment: Kyphotic  Communication   Communication: No difficulties  Cognition Arousal/Alertness: Awake/alert Behavior During Therapy: WFL for tasks assessed/performed Overall Cognitive Status: Within Functional Limits for tasks assessed                                        General Comments      Exercises     Assessment/Plan    PT Assessment Patient needs continued PT services  PT Problem List Decreased strength;Decreased mobility;Decreased activity tolerance;Decreased balance;Pain       PT Treatment Interventions DME instruction;Therapeutic activities;Gait training;Therapeutic exercise;Patient/family  education;Balance training    PT Goals (Current goals can be found in the Care Plan section)  Acute Rehab PT Goals Patient Stated Goal: Try to get stronger with rehab before returning home. PT Goal Formulation: With patient Time For Goal Achievement: 07/07/19 Potential to Achieve Goals: Fair    Frequency Min 2X/week   Barriers to discharge        Co-evaluation               AM-PAC PT "6 Clicks" Mobility  Outcome Measure Help needed turning from your back to your side while in a flat bed without using bedrails?: A Little Help needed moving from lying on your back to sitting on the side of a flat bed without using bedrails?: A Little Help needed moving to and from a bed to a chair (including a wheelchair)?: A Lot Help needed standing up from a chair using your arms (e.g., wheelchair or bedside chair)?: A Little Help needed to walk in hospital room?: A Lot Help needed climbing 3-5 steps with a railing? : A Lot 6 Click Score: 15    End of Session Equipment Utilized During Treatment: Gait belt Activity Tolerance: Patient limited by pain Patient left: in chair;with call bell/phone within reach;with chair alarm set Nurse Communication: Mobility status PT Visit Diagnosis: Unsteadiness on feet (R26.81);Other abnormalities of gait and mobility (R26.89);Muscle weakness (generalized) (M62.81);Difficulty in walking, not elsewhere classified (R26.2);Pain    Time: 4765-4650 PT Time Calculation (min) (ACUTE ONLY): 50 min   Charges:  PT Evaluation $PT Eval Moderate Complexity: 1 Mod PT Treatments $Gait Training: 8-22 mins $Self Care/Home Management: 8-22        Floria Raveling. Hartnett-Rands, MS, PT Per Index #[id number] 06/23/2019, 12:37 PM

## 2019-06-24 ENCOUNTER — Inpatient Hospital Stay
Admission: RE | Admit: 2019-06-24 | Discharge: 2019-07-01 | Disposition: A | Discharge status: Nursing Home (Includes ICF) | Payer: Medicare Other | Source: Ambulatory Visit | Attending: Internal Medicine | Admitting: Internal Medicine

## 2019-06-24 LAB — PROTIME-INR
INR: 2.6 — ABNORMAL HIGH (ref 0.8–1.2)
Prothrombin Time: 27.3 seconds — ABNORMAL HIGH (ref 11.4–15.2)

## 2019-06-24 LAB — BASIC METABOLIC PANEL
Anion gap: 10 (ref 5–15)
BUN: 21 mg/dL (ref 8–23)
CO2: 31 mmol/L (ref 22–32)
Calcium: 8.6 mg/dL — ABNORMAL LOW (ref 8.9–10.3)
Chloride: 96 mmol/L — ABNORMAL LOW (ref 98–111)
Creatinine, Ser: 1 mg/dL (ref 0.44–1.00)
GFR calc Af Amer: >60 mL/min (ref >60)
GFR calc non Af Amer: 54 mL/min — ABNORMAL LOW (ref >60)
Glucose, Bld: 91 mg/dL (ref 70–99)
Potassium: 3.9 mmol/L (ref 3.5–5.1)
Sodium: 137 mmol/L (ref 135–145)

## 2019-06-24 LAB — MAGNESIUM: Magnesium: 2 mg/dL (ref 1.7–2.4)

## 2019-06-24 MED ORDER — BIOTENE DRY MOUTH MT LIQD: 15.0000 mL | OROMUCOSAL | Status: DC | PRN

## 2019-06-24 MED ORDER — WARFARIN SODIUM 1 MG PO TABS
2.0000 mg | ORAL_TABLET | Freq: Once | ORAL | Status: AC
  Administered 2019-06-24: 2 mg via ORAL
  Filled 2019-06-24: qty 2

## 2019-06-24 MED ORDER — OXYCODONE-ACETAMINOPHEN 5-325 MG PO TABS: 1.0000 | ORAL_TABLET | Freq: Four times a day (QID) | ORAL | 0 refills | Status: DC | PRN

## 2019-06-24 MED ORDER — NAPHAZOLINE-GLYCERIN 0.012-0.2 % OP SOLN
1.0000 [drp] | Freq: Four times a day (QID) | OPHTHALMIC | Status: DC | PRN
  Filled 2019-06-24: qty 15

## 2019-06-24 MED ORDER — METHOCARBAMOL 500 MG PO TABS: 500.0000 mg | ORAL_TABLET | Freq: Three times a day (TID) | ORAL | 0 refills | Status: DC

## 2019-06-24 NOTE — TOC Transition Note (Signed)
Transition of Care [facility]) - CM/SW Discharge Note   Patient Details  Name: Angela Wong MRN: [id number] Date of Birth: 12-18-1940  Transition of Care [facility]) CM/SW Contact:  Ihor Gully, LCSW Phone Number: 06/24/2019, 2:42 PM   Clinical Narrative:    Discharge clinicals sent to facility. Kerri at [facility] notified of d/c. TOC signing off.    Final next level of care: Skilled Nursing Facility Barriers to Discharge: No Barriers Identified   Patient Goals and CMS Choice Patient states their goals for this hospitalization and ongoing recovery are:: rehab then home   Choice offered to / list presented to : Patient  Discharge Placement PASRR number recieved: 06/23/19            Patient chooses bed at: [facility] Patient to be transferred to facility by: staff Name of family member notified: son, jeff Patient and family notified of of transfer: 06/24/19  Discharge Plan and Services     Post Acute Care Choice: [place]                               Social Determinants of Health (SDOH) Interventions     Readmission Risk Interventions No flowsheet data found.

## 2019-06-24 NOTE — Discharge Summary (Signed)
Physician Discharge Summary  Angela Wong UDJ:[id number] DOB: [birth year] DOA: 06/22/2019  PCP: [name], FNP  Admit date: 06/22/2019  Discharge date: 06/24/2019  Admitted From:Home  Disposition:  SNF  Recommendations for Outpatient Follow-up:  1. Follow up with PCP in 1-2 weeks 2. Continue on medications as prescribed 3. Given prescription for Robaxin as well as Roxicet for 10 tablets each and 0 refills 4. Continue on warfarin for atrial fibrillation 5. Continue on daily prednisone for rheumatoid arthritis/Sjogren's 6. 2D echocardiogram with LVEF 50% and global hypokinesis of left ventricle, continue on Bumex as prior and follow-up with cardiology at [facility].  Home Health: None  Equipment/Devices: None  Discharge Condition: Stable  CODE STATUS: Full  Diet recommendation: Heart Healthy  Brief/Interim Summary: As per H&P written by Dr. Denton Brick on 06/22/2019 79 y.o.femalewith medical history significant foratrial fibrillation, COPD, congestive cardiomyopathy, hypertension, asthma, regrowing Raynaud's and rheumatoid arthritis. Patient presented to the ED with complaints of increasing difficulty breathing, leg swelling,both of these have been ongoing issues for several weeks,but she reports over the past few days her breathing has worsened.With any significant activity, she feels drained, exhausted with difficulty breathing, these symptomsresolve with rest. She reports a cough over the past week. Her home dose of Bumex has been adjusted from alternating doses of 0.5 and 1 mg every day, to 1and a1/2 mg every day. She is currently on 1 mg of Bumex every day. This has improved her lower extremity swelling. Patient has chronic body pains involving most of her joints from rheumatoid arthritis, so she is unable to ambulate or fully take care of herself. She reports she has not had a bath in several days. Her daughter drops of groceries once a week, and she has a 4 year old  granddaughter who is able to offer limited help.  ED Course:O2 sats greater than 98% on room air, temperature 98.5, blood pressure 120s to 150s.WBC 8.5. BNP 296. Potassium 3.2. D-dimer 0.65.Chest x-ray showed cardiomegaly without acute disease. 40 mg Lasix given in the ED, hospitalist admit for further evaluation and management.  -Patient was admitted with acute on chronic diastolic CHF exacerbation in the setting of congestive dilated cardiomyopathy.  She has diuresed quite well with IV Lasix twice daily and has reached maximal medical benefit with IV diuretics according to her lab work as well as clinical examination.  She should continue on Bumex 1 mg daily as prescribed as well as her usual home medications for chronic atrial fibrillation.  She is on stable condition for discharge to SNF today and denies any symptomatic complaints or concerns.  She will need outpatient cardiology visit with her cardiologist at [facility].  No other acute events or concerns noted throughout the course of this hospitalization.  PT has assessed her and she appears to have deconditioning that requires inpatient rehabilitation-short-term.  She is agreeable to this and is stable for discharge.  Discharge Diagnoses:  Principal Problem:   Congestive dilated cardiomyopathy ([place]) Active Problems:   Atrial fibrillation, permanent (HCC)   Benign essential HTN   Dyspnea   Moderate persistent asthma without complication   Sjogren's syndrome (HCC)   Anticoagulation goal of INR 2 to 3   RA (rheumatoid arthritis) (HCC)   Decompensated heart failure (HCC)   Pressure injury of skin  Principal discharge diagnosis: Acute on chronic diastolic heart failure.  Discharge Instructions  Discharge Instructions    Change dressing (specify)   Complete by: As directed    Dressing change: daily.   Diet - low  sodium heart healthy   Complete by: As directed    Increase activity slowly   Complete by: As directed       Allergies as of 06/24/2019      Reactions   Cephalosporins Hives, Shortness Of Breath   Ciprofloxacin Hives, Shortness Of Breath   Diltiazem Shortness Of Breath   Swollen throat   Doxycycline Hives, Shortness Of Breath   Horse-derived Products Anaphylaxis   Ketek [telithromycin] Palpitations   Chest discomfort   Nitrofuran Derivatives Anaphylaxis, Hives   blisters   Nitrous Oxide Nausea And Vomiting   Severe due to Sjogrens (Auto-Immune Disease)   Other Anaphylaxis   ALLERGY TO HORSE SERUM   Penicillins Hives, Shortness Of Breath      Pentazocine Other (See Comments)   Other reaction(s): Mental Status Changes (intolerance) Altered Mental Status  Altered Mental Status  Altered Mental Status    Sulfa Antibiotics Hives, Shortness Of Breath   Trovan [alatrofloxacin] Palpitations, Other (See Comments), Anaphylaxis   Chest pain, dizziness, irregular pulse   Zinc Gelatin [zinc] Anaphylaxis   Amlodipine Swelling   Calcium Channel Blockers    Respiratory distress   Carvedilol Other (See Comments)   Dizziness, "joint pain, depression"   Clindamycin/lincomycin    CP, lock jaw   Codeine Nausea Only   Cymbalta [duloxetine Hcl] Swelling   Diovan [valsartan] Swelling   Lisinopril Swelling   Sertraline Other (See Comments)   confusion   Tramadol Nausea Only   Loteprednol Etabonate Rash      Medication List    STOP taking these medications   oxyCODONE 5 MG immediate release tablet Commonly known as: Roxicodone     TAKE these medications   albuterol 108 (90 Base) MCG/ACT inhaler Commonly known as: VENTOLIN HFA INHALE 2 PUFFS BY MOUTH EVERY 6 HOURS AS NEEDED FOR WHEEZING What changed:   how much to take  how to take this  when to take this  reasons to take this  additional instructions   atenolol 25 MG tablet Commonly known as: TENORMIN Take 12.5 mg by mouth daily. *May take additional 12.5mg  daily as needed for A-Fib   B-complex with vitamin C tablet Take 1  tablet by mouth every evening.   Baclofen 5 MG Tabs Take 2.5 mg by mouth 3 (three) times daily as needed. What changed:   how much to take  reasons to take this   bumetanide 1 MG tablet Commonly known as: BUMEX Take 1 tablet (1 mg total) by mouth daily. TAKE 1 TABLET BY MOUTH EVERY OTHER DAY ON OPPOSITE DAY OF 0.5MG  DOSE What changed: additional instructions   diphenhydrAMINE 25 MG tablet Commonly known as: BENADRYL Take 25 mg by mouth daily as needed for itching or allergies.   docusate sodium 100 MG capsule Commonly known as: COLACE Take 400 mg by mouth every morning.   esomeprazole 40 MG capsule Commonly known as: NEXIUM Take 1 capsule (40 mg total) by mouth 2 (two) times daily. What changed: when to take this   fexofenadine 180 MG tablet Commonly known as: ALLEGRA Take 180 mg by mouth every morning.   Gas-X Extra Strength 125 MG Caps Generic drug: Simethicone Take 2 capsules by mouth daily as needed (for relief).   levothyroxine 50 MCG tablet Commonly known as: Euthyrox Take 2 tablets (100 mcg total) by mouth every morning.   losartan 100 MG tablet Commonly known as: COZAAR Take 1 tablet (100 mg total) by mouth daily.   Magnesium 400 MG Tabs Take 400  mg by mouth every evening.   methocarbamol 500 MG tablet Commonly known as: ROBAXIN Take 1 tablet (500 mg total) by mouth 3 (three) times daily.   Olopatadine HCl 0.2 % Soln Apply 1 drop to eye daily as needed (for allergy eye).   oxyCODONE-acetaminophen 5-325 MG tablet Commonly known as: PERCOCET/ROXICET Take 1 tablet by mouth every 6 (six) hours as needed for up to 10 days for moderate pain or severe pain.   Poly-Iron 150 Forte 150-0.025-1 MG Caps Generic drug: Iron Polysacch Cmplx-B12-FA Take 1 capsule by mouth daily.   predniSONE 5 MG tablet Commonly known as: DELTASONE Take 5 mg by mouth every morning.   Restasis 0.05 % ophthalmic emulsion Generic drug: cycloSPORINE INSTILL 1 DROP INTO EACH EYE  TWICE DAILY What changed: See the new instructions.   Symbicort 160-4.5 MCG/ACT inhaler Generic drug: budesonide-formoterol Inhale 2 puffs by mouth twice daily What changed: when to take this   traZODone 150 MG tablet Commonly known as: DESYREL Take 1 tablet (150 mg total) by mouth at bedtime.   vitamin B-12 500 MCG tablet Commonly known as: CYANOCOBALAMIN Take 1 tablet (500 mcg total) by mouth daily.   Vitamin D 50 MCG (2000 UT) Caps Take 2 capsules by mouth every evening.   warfarin 1 MG tablet Commonly known as: COUMADIN Take as directed. If you are unsure how to take this medication, talk to your nurse or doctor. Original instructions: TAKE 2 TO 4 TABLETS BY MOUTH ONCE DAILY AS DIRECTED BY  ANTICOAGULATION  CLINIC What changed:   how much to take  how to take this  additional instructions            Discharge Care Instructions  (From admission, onward)         Start     Ordered   06/24/19 0000  Change dressing (specify)       Comments: Dressing change: daily.   06/24/19 1024          Follow-up Information    [name] A, FNP Follow up in 1 week(s).   Specialty: Family Medicine Contact information: [place] [address] [phone]              Allergies  Allergen Reactions  . Cephalosporins Hives and Shortness Of Breath  . Ciprofloxacin Hives and Shortness Of Breath  . Diltiazem Shortness Of Breath    Swollen throat   . Doxycycline Hives and Shortness Of Breath  . Horse-Derived Products Anaphylaxis  . Ketek [Telithromycin] Palpitations    Chest discomfort  . Nitrofuran Derivatives Anaphylaxis and Hives    blisters  . Nitrous Oxide Nausea And Vomiting    Severe due to Sjogrens (Auto-Immune Disease)  . Other Anaphylaxis    ALLERGY TO HORSE SERUM  . Penicillins Hives and Shortness Of Breath       . Pentazocine Other (See Comments)    Other reaction(s): Mental Status Changes (intolerance) Altered Mental  Status  Altered Mental Status  Altered Mental Status   . Sulfa Antibiotics Hives and Shortness Of Breath  . Trovan [Alatrofloxacin] Palpitations, Other (See Comments) and Anaphylaxis    Chest pain, dizziness, irregular pulse  . Zinc Gelatin [Zinc] Anaphylaxis  . Amlodipine Swelling  . Calcium Channel Blockers     Respiratory distress  . Carvedilol Other (See Comments)    Dizziness, "joint pain, depression"  . Clindamycin/Lincomycin     CP, lock jaw  . Codeine Nausea Only  . Cymbalta [Duloxetine Hcl] Swelling  .  Diovan [Valsartan] Swelling  . Lisinopril Swelling  . Sertraline Other (See Comments)    confusion  . Tramadol Nausea Only  . Loteprednol Etabonate Rash    Consultations:  None   Procedures/Studies: DG Chest Portable 1 View  Result Date: 06/22/2019 CLINICAL DATA:  Shortness of breath and lower extremity swelling for the past few weeks. EXAM: PORTABLE CHEST 1 VIEW COMPARISON:  PA and lateral chest 03/19/2018 and CT chest 12/24/2017. FINDINGS: Pacing device is unchanged. Lungs are clear. Heart size is enlarged. No pneumothorax or pleural effusion. No acute bony abnormality. IMPRESSION: Cardiomegaly without acute disease. Electronically Signed   By: Inge Rise M.D.   On: 06/22/2019 14:57   ECHOCARDIOGRAM COMPLETE  Result Date: 06/23/2019    ECHOCARDIOGRAM REPORT   Patient Name:   Angela Wong Date of Exam: 06/23/2019 Medical Rec #:  [id number]       Height:       64.0 in Accession #:    [id number]      Weight:       130.9 lb Date of Birth:  1940/01/16      BSA:          1.634 m Patient Age:    50 years        BP:           115/69 mmHg Patient Gender: F               HR:           63 bpm. Exam Location:  [facility] Procedure: 2D Echo Indications:    Congestive Heart Failure 428.0 / I50.9  History:        Patient has prior history of Echocardiogram examinations, most                 recent 08/26/2017. Arrythmias:Atrial Fibrillation,                 Signs/Symptoms:Dyspnea;  Risk Factors:Hypertension and                 Dyslipidemia. MVP (mitral valve prolapse, GERD, History of                 ovarian cancer, RA (rheumatoid arthritis.  Sonographer:    Leavy Cella RDCS (AE) Referring Phys: Andrews  1. Left ventricular ejection fraction, by estimation, is 50%. The left ventricle has low normal function. The left ventricle demonstrates global hypokinesis. There is mild left ventricular hypertrophy. Left ventricular diastolic parameters are indeterminate.  2. Right ventricular systolic function is normal. The right ventricular size is normal. There is normal pulmonary artery systolic pressure. The estimated right ventricular systolic pressure is 52.8 mmHg.  3. Left atrial size was moderately dilated.  4. The mitral valve is grossly normal with mild annular calcification. Mild mitral valve regurgitation.  5. The aortic valve is tricuspid. Aortic valve regurgitation is not visualized. Mild aortic valve sclerosis is present, with no evidence of aortic valve stenosis.  6. The inferior vena cava is normal in size with greater than 50% respiratory variability, suggesting right atrial pressure of 3 mmHg. FINDINGS  Left Ventricle: Left ventricular ejection fraction, by estimation, is 50%. The left ventricle has low normal function. The left ventricle demonstrates global hypokinesis. The left ventricular internal cavity size was normal in size. There is mild left ventricular hypertrophy. Left ventricular diastolic parameters are indeterminate. Right Ventricle: The right ventricular size is normal. No increase in right ventricular wall thickness. Right ventricular  systolic function is normal. There is normal pulmonary artery systolic pressure. The tricuspid regurgitant velocity is 1.83 m/s, and  with an assumed right atrial pressure of 3 mmHg, the estimated right ventricular systolic pressure is 65.4 mmHg. Left Atrium: Left atrial size was moderately dilated. Right  Atrium: Right atrial size was normal in size. Pericardium: There is no evidence of pericardial effusion. Presence of pericardial fat pad. Mitral Valve: The mitral valve is grossly normal. Mild mitral annular calcification. Mild mitral valve regurgitation. Tricuspid Valve: The tricuspid valve is grossly normal. Tricuspid valve regurgitation is trivial. Aortic Valve: The aortic valve is tricuspid. Aortic valve regurgitation is not visualized. Mild aortic valve sclerosis is present, with no evidence of aortic valve stenosis. Mild aortic valve annular calcification. Pulmonic Valve: The pulmonic valve was grossly normal. Pulmonic valve regurgitation is trivial. Aorta: The aortic root is normal in size and structure. Venous: The inferior vena cava is normal in size with greater than 50% respiratory variability, suggesting right atrial pressure of 3 mmHg. IAS/Shunts: No atrial level shunt detected by color flow Doppler.  LEFT VENTRICLE PLAX 2D LVIDd:         5.06 cm  Diastology LVIDs:         3.76 cm  LV e' lateral:   9.32 cm/s LV PW:         1.22 cm  LV E/e' lateral: 10.4 LV IVS:        0.94 cm  LV e' medial:    6.49 cm/s LVOT diam:     2.00 cm  LV E/e' medial:  14.9 LVOT Area:     3.14 cm  RIGHT VENTRICLE RV S prime:     10.40 cm/s TAPSE (M-mode): 1.6 cm LEFT ATRIUM             Index       RIGHT ATRIUM           Index LA diam:        4.60 cm 2.82 cm/m  RA Area:     20.70 cm LA Vol (A2C):   68.3 ml 41.80 ml/m RA Volume:   59.90 ml  36.66 ml/m LA Vol (A4C):   75.3 ml 46.09 ml/m LA Biplane Vol: 71.6 ml 43.82 ml/m   AORTA Ao Root diam: 2.70 cm MITRAL VALVE               TRICUSPID VALVE MV Area (PHT): 3.48 cm    TR Peak grad:   13.4 mmHg MV Decel Time: 218 msec    TR Vmax:        183.00 cm/s MR Peak grad: 28.9 mmHg MR Vmax:      269.00 cm/s  SHUNTS MV E velocity: 97.00 cm/s  Systemic Diam: 2.00 cm MV A velocity: 23.80 cm/s MV E/A ratio:  4.08 Rozann Lesches MD Electronically signed by Rozann Lesches MD Signature  Date/Time: 06/23/2019/12:11:59 PM    Final      Discharge Exam: Vitals:   06/23/19 2243 06/24/19 0820  BP: (!) 116/55   Pulse: 69   Resp:    Temp:    SpO2:  98%   Vitals:   06/23/19 2126 06/23/19 2243 06/24/19 0500 06/24/19 0820  BP: (!) 94/50 (!) 116/55    Pulse: 65 69    Resp: 20     Temp: 98.7 F (37.1 C)     TempSrc: Oral     SpO2: 99%   98%  Weight:   60 kg   Height:  General: Pt is alert, awake, not in acute distress Cardiovascular: RRR, S1/S2 +, no rubs, no gallops Respiratory: CTA bilaterally, no wheezing, no rhonchi Abdominal: Soft, NT, ND, bowel sounds + Extremities: no edema, no cyanosis    The results of significant diagnostics from this hospitalization (including imaging, microbiology, ancillary and laboratory) are listed below for reference.     Microbiology: Recent Results (from the past 240 hour(s))  SARS Coronavirus 2 by RT PCR (hospital order, performed in [facility] hospital lab) Nasopharyngeal Nasopharyngeal Swab     Status: None   Collection Time: 06/22/19  4:07 PM   Specimen: Nasopharyngeal Swab  Result Value Ref Range Status   SARS Coronavirus 2 NEGATIVE NEGATIVE Final    Comment: (NOTE) SARS-CoV-2 target nucleic acids are NOT DETECTED. The SARS-CoV-2 RNA is generally detectable in upper and lower respiratory specimens during the acute phase of infection. The lowest concentration of SARS-CoV-2 viral copies this assay can detect is 250 copies / mL. A negative result does not preclude SARS-CoV-2 infection and should not be used as the sole basis for treatment or other patient management decisions.  A negative result may occur with improper specimen collection / handling, submission of specimen other than nasopharyngeal swab, presence of viral mutation(s) within the areas targeted by this assay, and inadequate number of viral copies (<250 copies / mL). A negative result must be combined with clinical observations, patient history, and  epidemiological information. Fact Sheet for Patients:   StrictlyIdeas.no Fact Sheet for Healthcare Providers: BankingDealers.co.za This test is not yet approved or cleared  by the [place] FDA and has been authorized for detection and/or diagnosis of SARS-CoV-2 by FDA under an Emergency Use Authorization (EUA).  This EUA will remain in effect (meaning this test can be used) for the duration of the COVID-19 declaration under Section 564(b)(1) of the Act, 21 U.S.C. section 360bbb-3(b)(1), unless the authorization is terminated or revoked sooner. Performed at [facility], [address] [place], [place] [address]      Labs: BNP (last 3 results) Recent Labs    06/22/19 1410  BNP 295.6*   Basic Metabolic Panel: Recent Labs  Lab 06/22/19 1410 06/23/19 0603 06/24/19 0612  NA 134* 136 137  K 3.2* 3.0* 3.9  CL 93* 96* 96*  CO2 29 32 31  GLUCOSE 101* 93 91  BUN 17 15 21   CREATININE 0.74 0.71 1.00  CALCIUM 9.1 8.7* 8.6*  MG 2.0 2.0 2.0   Liver Function Tests: Recent Labs  Lab 06/22/19 1410  AST 17  ALT 15  ALKPHOS 76  BILITOT 1.0  PROT 6.5  ALBUMIN 3.8   No results for input(s): LIPASE, AMYLASE in the last 168 hours. No results for input(s): AMMONIA in the last 168 hours. CBC: Recent Labs  Lab 06/22/19 1410  WBC 8.5  HGB 13.2  HCT 38.7  MCV 92.1  PLT 225   Cardiac Enzymes: No results for input(s): CKTOTAL, CKMB, CKMBINDEX, TROPONINI in the last 168 hours. BNP: Invalid input(s): POCBNP CBG: No results for input(s): GLUCAP in the last 168 hours. D-Dimer Recent Labs    06/22/19 1430  DDIMER 0.65*   Hgb A1c No results for input(s): HGBA1C in the last 72 hours. Lipid Profile No results for input(s): CHOL, HDL, LDLCALC, TRIG, CHOLHDL, LDLDIRECT in the last 72 hours. Thyroid function studies No results for input(s): TSH, T4TOTAL, T3FREE, THYROIDAB in the last 72 hours.  Invalid input(s): FREET3 Anemia  work up No results for input(s): VITAMINB12, FOLATE, FERRITIN, TIBC,  IRON, RETICCTPCT in the last 72 hours. Urinalysis    Component Value Date/Time   COLORURINE AMBER (A) 12/24/2017 0225   APPEARANCEUR HAZY (A) 12/24/2017 0225   APPEARANCEUR Clear 04/18/2016 1008   LABSPEC 1.043 (H) 12/24/2017 0225   PHURINE 7.0 12/24/2017 0225   GLUCOSEU NEGATIVE 12/24/2017 0225   HGBUR NEGATIVE 12/24/2017 0225   BILIRUBINUR NEGATIVE 12/24/2017 0225   BILIRUBINUR Negative 04/18/2016 1008   KETONESUR 20 (A) 12/24/2017 0225   PROTEINUR NEGATIVE 12/24/2017 0225   UROBILINOGEN negative 11/26/2013 1238   NITRITE NEGATIVE 12/24/2017 0225   LEUKOCYTESUR NEGATIVE 12/24/2017 0225   LEUKOCYTESUR Negative 04/18/2016 1008   Sepsis Labs Invalid input(s): PROCALCITONIN,  WBC,  LACTICIDVEN Microbiology Recent Results (from the past 240 hour(s))  SARS Coronavirus 2 by RT PCR (hospital order, performed in [place] hospital lab) Nasopharyngeal Nasopharyngeal Swab     Status: None   Collection Time: 06/22/19  4:07 PM   Specimen: Nasopharyngeal Swab  Result Value Ref Range Status   SARS Coronavirus 2 NEGATIVE NEGATIVE Final    Comment: (NOTE) SARS-CoV-2 target nucleic acids are NOT DETECTED. The SARS-CoV-2 RNA is generally detectable in upper and lower respiratory specimens during the acute phase of infection. The lowest concentration of SARS-CoV-2 viral copies this assay can detect is 250 copies / mL. A negative result does not preclude SARS-CoV-2 infection and should not be used as the sole basis for treatment or other patient management decisions.  A negative result may occur with improper specimen collection / handling, submission of specimen other than nasopharyngeal swab, presence of viral mutation(s) within the areas targeted by this assay, and inadequate number of viral copies (<250 copies / mL). A negative result must be combined with clinical observations, patient history, and epidemiological  information. Fact Sheet for Patients:   StrictlyIdeas.no Fact Sheet for Healthcare Providers: BankingDealers.co.za This test is not yet approved or cleared  by the [place] FDA and has been authorized for detection and/or diagnosis of SARS-CoV-2 by FDA under an Emergency Use Authorization (EUA).  This EUA will remain in effect (meaning this test can be used) for the duration of the COVID-19 declaration under Section 564(b)(1) of the Act, 21 U.S.C. section 360bbb-3(b)(1), unless the authorization is terminated or revoked sooner. Performed at [facility], [address] [place], [place] [address]      Time coordinating discharge: 35 minutes  SIGNED:   Rodena Goldmann, DO [facility] 06/24/2019, 10:24 AM  If 7PM-7AM, please contact night-coverage [email or web address]

## 2019-06-24 NOTE — Progress Notes (Signed)
ANTICOAGULATION CONSULT NOTE -  Pharmacy Consult for warfarin Indication: atrial fibrillation  Allergies  Allergen Reactions  . Cephalosporins Hives and Shortness Of Breath  . Ciprofloxacin Hives and Shortness Of Breath  . Diltiazem Shortness Of Breath    Swollen throat   . Doxycycline Hives and Shortness Of Breath  . Horse-Derived Products Anaphylaxis  . Ketek [Telithromycin] Palpitations    Chest discomfort  . Nitrofuran Derivatives Anaphylaxis and Hives    blisters  . Nitrous Oxide Nausea And Vomiting    Severe due to Sjogrens (Auto-Immune Disease)  . Other Anaphylaxis    ALLERGY TO HORSE SERUM  . Penicillins Hives and Shortness Of Breath       . Pentazocine Other (See Comments)    Other reaction(s): Mental Status Changes (intolerance) Altered Mental Status  Altered Mental Status  Altered Mental Status   . Sulfa Antibiotics Hives and Shortness Of Breath  . Trovan [Alatrofloxacin] Palpitations, Other (See Comments) and Anaphylaxis    Chest pain, dizziness, irregular pulse  . Zinc Gelatin [Zinc] Anaphylaxis  . Amlodipine Swelling  . Calcium Channel Blockers     Respiratory distress  . Carvedilol Other (See Comments)    Dizziness, "joint pain, depression"  . Clindamycin/Lincomycin     CP, lock jaw  . Codeine Nausea Only  . Cymbalta [Duloxetine Hcl] Swelling  . Diovan [Valsartan] Swelling  . Lisinopril Swelling  . Sertraline Other (See Comments)    confusion  . Tramadol Nausea Only  . Loteprednol Etabonate Rash    Patient Measurements: Height: 5\' 4"  (162.6 cm) Weight: 60 kg (132 lb 4.4 oz) IBW/kg (Calculated) : 54.7   Vital Signs: Temp: 98.7 F (37.1 C) (06/09 2126) Temp Source: Oral (06/09 2126) BP: 116/55 (06/09 2243) Pulse Rate: 69 (06/09 2243)  Labs: Recent Labs    06/22/19 1410 06/22/19 1430 06/23/19 0603 06/24/19 0612  HGB 13.2  --   --   --   HCT 38.7  --   --   --   PLT 225  --   --   --   LABPROT  --  21.5* 22.9* 27.3*  INR  --  2.0*  2.1* 2.6*  CREATININE 0.74  --  0.71  --     Estimated Creatinine Clearance: 49.2 mL/min (by C-G formula based on SCr of 0.71 mg/dL).   Medical History: Past Medical History:  Diagnosis Date  . A-fib ([place])   . Asthma   . Cerebral vasculitis   . Congestive dilated cardiomyopathy ([place])   . COPD (chronic obstructive pulmonary disease) ([place])   . Fibromyalgia   . Gastric polyp   . GERD (gastroesophageal reflux disease)   . Hiatal hernia   . Hyperparathyroidism ([place])   . IBS (irritable bowel syndrome)   . MVP (mitral valve prolapse)   . Osteoarthritis   . Ovarian cancer ([place])    lymph node removal with hysterectomy  . Raynaud's disease   . RLS (restless legs syndrome)   . Situational depression   . Sjogren's syndrome ([place])   . Vasculitis ([place])   . Vitamin D deficiency      Assessment: 79 yo female with h/o afib on warfarin admitted for difficulty breathing. PTA dose 4mg  MWF and 2mg  TTSS with last dose given 6/7. INR today 2.6. No bleeding noted  Goal of Therapy:  INR 2-3 Monitor platelets by anticoagulation protocol: Yes   Plan:  Warfarin 2 mg PO tonight  Daily INR Monitor for s/s of bleeding.  Margot Ables, PharmD Clinical Pharmacist  06/24/2019 8:36 AM

## 2019-06-25 ENCOUNTER — Encounter: Payer: Self-pay | Admitting: Adult Health

## 2019-06-25 ENCOUNTER — Other Ambulatory Visit (HOSPITAL_COMMUNITY)
Admission: RE | Admit: 2019-06-25 | Discharge: 2019-06-25 | Disposition: A | Discharge status: Home / Self Care | Payer: Medicare Other | Source: Skilled Nursing Facility | Attending: Internal Medicine | Admitting: Internal Medicine

## 2019-06-25 ENCOUNTER — Non-Acute Institutional Stay (SKILLED_NURSING_FACILITY): Payer: Medicare Other | Admitting: Adult Health

## 2019-06-25 DIAGNOSIS — D509 Iron deficiency anemia, unspecified: Secondary | ICD-10-CM | POA: Diagnosis not present

## 2019-06-25 DIAGNOSIS — J4540 Moderate persistent asthma, uncomplicated: Secondary | ICD-10-CM

## 2019-06-25 DIAGNOSIS — M0579 Rheumatoid arthritis with rheumatoid factor of multiple sites without organ or systems involvement: Secondary | ICD-10-CM | POA: Diagnosis not present

## 2019-06-25 DIAGNOSIS — F321 Major depressive disorder, single episode, moderate: Secondary | ICD-10-CM

## 2019-06-25 DIAGNOSIS — M797 Fibromyalgia: Secondary | ICD-10-CM

## 2019-06-25 DIAGNOSIS — Z7901 Long term (current) use of anticoagulants: Secondary | ICD-10-CM | POA: Insufficient documentation

## 2019-06-25 DIAGNOSIS — K219 Gastro-esophageal reflux disease without esophagitis: Secondary | ICD-10-CM | POA: Diagnosis not present

## 2019-06-25 DIAGNOSIS — Z5181 Encounter for therapeutic drug level monitoring: Secondary | ICD-10-CM | POA: Diagnosis not present

## 2019-06-25 DIAGNOSIS — K5909 Other constipation: Secondary | ICD-10-CM

## 2019-06-25 DIAGNOSIS — I509 Heart failure, unspecified: Secondary | ICD-10-CM

## 2019-06-25 DIAGNOSIS — I4821 Permanent atrial fibrillation: Secondary | ICD-10-CM | POA: Diagnosis not present

## 2019-06-25 DIAGNOSIS — I110 Hypertensive heart disease with heart failure: Secondary | ICD-10-CM | POA: Diagnosis not present

## 2019-06-25 DIAGNOSIS — E039 Hypothyroidism, unspecified: Secondary | ICD-10-CM

## 2019-06-25 LAB — PROTIME-INR
INR: 2.8 — ABNORMAL HIGH (ref 0.8–1.2)
Prothrombin Time: 28.8 seconds — ABNORMAL HIGH (ref 11.4–15.2)

## 2019-06-25 NOTE — Progress Notes (Signed)
Location:    [facility] Room Number: 159/P Place of Service:  SNF (31)   CODE STATUS: Full Code  Allergies  Allergen Reactions  . Cephalosporins Hives and Shortness Of Breath  . Ciprofloxacin Hives and Shortness Of Breath  . Diltiazem Shortness Of Breath    Swollen throat   . Doxycycline Hives and Shortness Of Breath  . Horse-Derived Products Anaphylaxis  . Ketek [Telithromycin] Palpitations    Chest discomfort  . Nitrofuran Derivatives Anaphylaxis and Hives    blisters  . Nitrous Oxide Nausea And Vomiting    Severe due to Sjogrens (Auto-Immune Disease)  . Other Anaphylaxis    ALLERGY TO HORSE SERUM  . Penicillins Hives and Shortness Of Breath       . Pentazocine Other (See Comments)    Other reaction(s): Mental Status Changes (intolerance) Altered Mental Status  Altered Mental Status  Altered Mental Status   . Sulfa Antibiotics Hives and Shortness Of Breath  . Trovan [Alatrofloxacin] Palpitations, Other (See Comments) and Anaphylaxis    Chest pain, dizziness, irregular pulse  . Zinc Gelatin [Zinc] Anaphylaxis  . Amlodipine Swelling  . Calcium Channel Blockers     Respiratory distress  . Carvedilol Other (See Comments)    Dizziness, "joint pain, depression"  . Clindamycin/Lincomycin     CP, lock jaw  . Codeine Nausea Only  . Cymbalta [Duloxetine Hcl] Swelling  . Diovan [Valsartan] Swelling  . Lisinopril Swelling  . Sertraline Other (See Comments)    confusion  . Tramadol Nausea Only  . Loteprednol Etabonate Rash    Chief Complaint  Patient presents with  . Hospitalization Follow-up    Hospitalization Follow Up    HPI:  She is a 79 year old woman who has been hospitalized from 06-22-19 through 06-24-19. She had developed increased lower extremity edema; increased weakness and shortness of breath. She was treated for acute on chronic congestive heart failure. She is here for short term rehab. Her goal is to return back home. She does  have chronic generalized pain present. Denies any constipation; no shortness of breath and her lower extremity edema is improving. She will continue to be followed for her chronic illnesses including: chf; anemia; asthma.   Past Medical History:  Diagnosis Date  . A-fib ([place])   . Asthma   . Cerebral vasculitis   . Congestive dilated cardiomyopathy ([place])   . COPD (chronic obstructive pulmonary disease) ([place])   . Fibromyalgia   . Gastric polyp   . GERD (gastroesophageal reflux disease)   . Hiatal hernia   . Hyperparathyroidism ([place])   . IBS (irritable bowel syndrome)   . MVP (mitral valve prolapse)   . Osteoarthritis   . Ovarian cancer ([place])    lymph node removal with hysterectomy  . Raynaud's disease   . RLS (restless legs syndrome)   . Situational depression   . Sjogren's syndrome ([place])   . Vasculitis ([place])   . Vitamin D deficiency     Past Surgical History:  Procedure Laterality Date  . ABDOMINAL HYSTERECTOMY  1982   with right oophorectomy  . APPENDECTOMY    . BREAST BIOPSY Right    x 2  . BREAST SURGERY     Biopsy  . CARPAL TUNNEL RELEASE Right 1980  . CARPAL TUNNEL RELEASE Left 2010   x 2  . CATARACT EXTRACTION Bilateral   . CESAREAN SECTION     x 3  . KNEE SURGERY Left 2005  . OOPHORECTOMY Left 1962  .  PACEMAKER INSERTION  09/15/2014  . REFRACTIVE SURGERY Bilateral 2014  . TUBAL LIGATION      Social History   Socioeconomic History  . Marital status: Single    Spouse name: Not on file  . Number of children: Not on file  . Years of education: Not on file  . Highest education level: Not on file  Occupational History  . Not on file  Tobacco Use  . Smoking status: Never Smoker  . Smokeless tobacco: Never Used  Vaping Use  . Vaping Use: Never used  Substance and Sexual Activity  . Alcohol use: No  . Drug use: No  . Sexual activity: Not on file  Other Topics Concern  . Not on file  Social History Narrative   Lives alone.  Moved from [place].      Caffeine- 6 cups daily, mix of caffeine/decaf   Children- 3   Retired Therapist, sports   Social Determinants of Radio broadcast assistant Strain: High Risk  . Difficulty of Paying Living Expenses: Hard  Food Insecurity: No Food Insecurity  . Worried About Charity fundraiser in the Last Year: Never true  . Ran Out of Food in the Last Year: Never true  Transportation Needs: Unmet Transportation Needs  . Lack of Transportation (Medical): Yes  . Lack of Transportation (Non-Medical): No  Physical Activity:   . Days of Exercise per Week:   . Minutes of Exercise per Session:   Stress:   . Feeling of Stress :   Social Connections: Socially Isolated  . Frequency of Communication with Friends and Family: Once a week  . Frequency of Social Gatherings with Friends and Family: Once a week  . Attends Religious Services: Never  . Active Member of Clubs or Organizations: No  . Attends Archivist Meetings: Never  . Marital Status: Widowed  Intimate Partner Violence:   . Fear of Current or Ex-Partner:   . Emotionally Abused:   Marland Kitchen Physically Abused:   . Sexually Abused:    Family History  Problem Relation Age of Onset  . COPD Mother   . Breast cancer Mother   . Allergic rhinitis Mother   . Heart disease Father        No details  . Kidney disease Father   . Allergic rhinitis Father   . Stroke Sister   . Arthritis/Rheumatoid Sister   . Asthma Sister   . Lupus Sister   . Heart attack Sister   . Stroke Paternal Grandmother   . Scleroderma Grandchild   . Thyroid disease Other   . Breast cancer Maternal Aunt        x 2      VITAL SIGNS BP (!) 116/55   Pulse 69   Temp (!) 96.8 F (36 C) (Oral)   Resp 20   Ht 5\' 4"  (1.626 m)   Wt 130 lb 9.6 oz (59.2 kg)   SpO2 99%   BMI 22.42 kg/m   Outpatient Encounter Medications as of 06/25/2019  Medication Sig  . albuterol (VENTOLIN HFA) 108 (90 Base) MCG/ACT inhaler INHALE 2 PUFFS BY MOUTH EVERY 6 HOURS AS NEEDED FOR WHEEZING  . atenolol  (TENORMIN) 25 MG tablet Take 12.5 mg by mouth daily. *May take additional 12.5mg  daily as needed for A-Fib  . B Complex-C (B-COMPLEX WITH VITAMIN C) tablet Take 1 tablet by mouth every evening.   Derrill Memo ON 06/26/2019] bumetanide (BUMEX) 0.5 MG tablet Take 0.5 mg by mouth every other day.  Marland Kitchen  bumetanide (BUMEX) 1 MG tablet Take 1 mg by mouth every other day.  . Cholecalciferol (VITAMIN D) 2000 UNITS CAPS Take 2 capsules by mouth every evening.   . diphenhydrAMINE (BENADRYL) 25 MG tablet Take 25 mg by mouth daily as needed for itching or allergies.   Marland Kitchen docusate sodium (COLACE) 100 MG capsule Take 400 mg by mouth every morning.   Marland Kitchen esomeprazole (NEXIUM) 40 MG capsule Take 1 capsule (40 mg total) by mouth 2 (two) times daily.  . fexofenadine (ALLEGRA) 180 MG tablet Take 180 mg by mouth every morning.   . Iron Polysacch Cmplx-B12-FA (POLY-IRON 150 FORTE) 150-0.025-1 MG CAPS Take 1 capsule by mouth daily.  Marland Kitchen levothyroxine (EUTHYROX) 50 MCG tablet Take 2 tablets (100 mcg total) by mouth every morning.  Marland Kitchen losartan (COZAAR) 100 MG tablet Take 1 tablet (100 mg total) by mouth daily.  . Magnesium 400 MG TABS Take 400 mg by mouth every evening.   . methocarbamol (ROBAXIN) 500 MG tablet Take 1 tablet (500 mg total) by mouth 3 (three) times daily.  . NON FORMULARY Diet: _____ Regular, __x____ NAS, _______Consistent Carbohydrate, _______NPO _____Other  . Olopatadine HCl 0.2 % SOLN Apply 1 drop to eye daily as needed (for allergy eye).  Marland Kitchen oxyCODONE-acetaminophen (PERCOCET/ROXICET) 5-325 MG tablet Take 1 tablet by mouth every 6 (six) hours as needed for up to 10 days for moderate pain or severe pain.  . predniSONE (DELTASONE) 5 MG tablet Take 5 mg by mouth every morning.  . RESTASIS 0.05 % ophthalmic emulsion INSTILL 1 DROP INTO EACH EYE TWICE DAILY  . Simethicone (GAS-X EXTRA STRENGTH) 125 MG CAPS Take 2 capsules by mouth daily as needed (for relief).   . SYMBICORT 160-4.5 MCG/ACT inhaler Inhale 2 puffs by  mouth twice daily  . traZODone (DESYREL) 150 MG tablet Take 1 tablet (150 mg total) by mouth at bedtime.  . vitamin B-12 (CYANOCOBALAMIN) 500 MCG tablet Take 1 tablet (500 mcg total) by mouth daily.  Marland Kitchen warfarin (COUMADIN) 2 MG tablet Take 2 mg by mouth daily.  . [DISCONTINUED] Baclofen 5 MG TABS Take 2.5 mg by mouth 3 (three) times daily as needed. (Patient taking differently: Take 2.5-5 mg by mouth 3 (three) times daily as needed (for muscle spasms). )  . [DISCONTINUED] bumetanide (BUMEX) 1 MG tablet Take 1 tablet (1 mg total) by mouth daily. TAKE 1 TABLET BY MOUTH EVERY OTHER DAY ON OPPOSITE DAY OF 0.5MG  DOSE (Patient taking differently: Take 1 mg by mouth every other day. )  . [DISCONTINUED] warfarin (COUMADIN) 1 MG tablet TAKE 2 TO 4 TABLETS BY MOUTH ONCE DAILY AS DIRECTED BY  ANTICOAGULATION  CLINIC   No facility-administered encounter medications on file as of 06/25/2019.     SIGNIFICANT DIAGNOSTIC EXAMS  TODAY;   06-22-19: chest x-ray: Cardiomegaly without acute disease.  06-23-19: 2-d echo:  Left ventricular ejection fraction, by estimation, is 50%. The left  ventricle has low normal function. The left ventricle demonstrates global  hypokinesis. There is mild left ventricular hypertrophy. Left ventricular  diastolic parameters are  indeterminate.   LABS REVIEWED TODAY  03-03-19; tsh 2.760 06-22-19: wbc 8.5; hgb 13.2; hct 38.7; mcv 92.1 plt 225; glucose 101; bun 17; creat 0.74; k+ 3.2; an++ 134; ca 9.1; liver normal albumin 3.8; mag 2.0; BNP 296.0 d-dimer 0.65; INR 2.0  06-24-19: glucose 91; bun 21; creat 1.00; k+ 3.9 na++ 137; ca 8.6 mag 2.0  06-25-19: INR 2.8   Review of Systems  Constitutional: Negative for malaise/fatigue.  Respiratory: Negative for cough and shortness of breath.   Cardiovascular: Negative for chest pain, palpitations and leg swelling.  Gastrointestinal: Negative for abdominal pain, constipation and heartburn.  Musculoskeletal: Negative for back pain, joint pain  and myalgias.  Skin: Negative.   Neurological: Negative for dizziness.  Psychiatric/Behavioral: The patient is not nervous/anxious.     Physical Exam Constitutional:      General: She is not in acute distress.    Appearance: She is well-developed. She is not diaphoretic.  Neck:     Thyroid: No thyromegaly.  Cardiovascular:     Rate and Rhythm: Normal rate and regular rhythm.     Heart sounds: Normal heart sounds.     Comments: Pacemaker  Pulmonary:     Effort: Pulmonary effort is normal. No respiratory distress.     Breath sounds: Normal breath sounds.  Abdominal:     General: Bowel sounds are normal. There is no distension.     Palpations: Abdomen is soft.     Tenderness: There is no abdominal tenderness.  Musculoskeletal:        General: Normal range of motion.     Cervical back: Neck supple.     Right lower leg: No edema.     Left lower leg: No edema.  Lymphadenopathy:     Cervical: No cervical adenopathy.  Skin:    General: Skin is warm and dry.     Comments: Lower extremities discolored   Neurological:     Mental Status: She is alert and oriented to person, place, and time.  Psychiatric:        Mood and Affect: Mood normal.       ASSESSMENT/ PLAN:  TODAY  1. Decompensated heart failure: is stable will continue bumex 1 mg daily alternating with 0.5 mg daily   2. Atrial fibrillation, permanent: heart rate is stable will continue tenormin 12.5 mg daily for rate control and coumadin 2 mg daily   3. Anticoagulation goal 2-3: INR today is 2.8 will continue coumadin 2 mg daily will check INR 06-28-19.   4. Moderate persistent asthma without complication: is stable will continue symbicort 160/4.5 mcg 2 puffs twice daily allegra 180 mg daily albuterol 2 puffs every 6 hours as needed  5. Hypertensive heart disease with chronic heart failure: is stable b/p 116/55: will continue tenormin 12.5 mg daily and cozaar 100 mg daily   6. Gastroesophageal reflux disease without  esophagitis: is stable will continue nexium 40 mg daily   7. Rheumatoid arthritis multiple sites with rheumatoid factor/fibromyaglia: is stable will continue prednisone 5 mg daily uses percocet 5/325 mg every 6 hours as needed robaxin 500 mg three times daily   8. Depression major single episode moderate: is stable will continue trazodone 150 mg nightly   9. Iron deficiency anemia unspecified deficiency type: is stable hgb 13.2 will continue poly iron 150 forte daily   10. Hypothyroidism unspecified hypothyroidism: is stable tsh 2.760 will continue levothyroxine 100 mcg daily   11.  Chronic constipation: is stable will continue colace 400 mg daily   12. Hypomagnesemia: is stable mag 2.0 will continue mag ox 400 mg daily    MD is aware of resident's narcotic use and is in agreement with current plan of care. We will attempt to wean resident as appropriate.  Ok Edwards NP [facility]  Contact [phone] Monday through Friday 8am- 5pm  After hours call [phone]

## 2019-06-27 ENCOUNTER — Encounter (HOSPITAL_COMMUNITY)
Admission: RE | Admit: 2019-06-27 | Discharge: 2019-06-27 | Disposition: A | Discharge status: Home / Self Care | Payer: Medicare Other | Source: Skilled Nursing Facility | Attending: Internal Medicine | Admitting: Internal Medicine

## 2019-06-27 DIAGNOSIS — N390 Urinary tract infection, site not specified: Secondary | ICD-10-CM | POA: Insufficient documentation

## 2019-06-27 LAB — URINALYSIS, ROUTINE W REFLEX MICROSCOPIC
Bilirubin Urine: NEGATIVE
Glucose, UA: NEGATIVE mg/dL
Hgb urine dipstick: NEGATIVE
Ketones, ur: NEGATIVE mg/dL
Leukocytes,Ua: NEGATIVE
Nitrite: NEGATIVE
Protein, ur: NEGATIVE mg/dL
Specific Gravity, Urine: 1.005 (ref 1.005–1.030)
pH: 7 (ref 5.0–8.0)

## 2019-06-28 ENCOUNTER — Encounter: Payer: Self-pay | Admitting: Internal Medicine

## 2019-06-28 ENCOUNTER — Non-Acute Institutional Stay (SKILLED_NURSING_FACILITY): Payer: Medicare Other | Admitting: Internal Medicine

## 2019-06-28 ENCOUNTER — Encounter (HOSPITAL_COMMUNITY)
Admission: RE | Admit: 2019-06-28 | Discharge: 2019-06-28 | Disposition: A | Discharge status: Home / Self Care | Payer: Medicare Other | Source: Skilled Nursing Facility | Attending: Adult Health | Admitting: Adult Health

## 2019-06-28 DIAGNOSIS — I4821 Permanent atrial fibrillation: Secondary | ICD-10-CM

## 2019-06-28 DIAGNOSIS — I509 Heart failure, unspecified: Secondary | ICD-10-CM | POA: Diagnosis not present

## 2019-06-28 DIAGNOSIS — I214 Non-ST elevation (NSTEMI) myocardial infarction: Secondary | ICD-10-CM | POA: Diagnosis not present

## 2019-06-28 DIAGNOSIS — R531 Weakness: Secondary | ICD-10-CM | POA: Diagnosis not present

## 2019-06-28 LAB — URINE CULTURE: Culture: NO GROWTH

## 2019-06-28 LAB — PROTIME-INR
INR: 2 — ABNORMAL HIGH (ref 0.8–1.2)
Prothrombin Time: 21.6 seconds — ABNORMAL HIGH (ref 11.4–15.2)

## 2019-06-28 NOTE — Assessment & Plan Note (Addendum)
Rate controlled PT/INR 2.0 today Warfarin 3 mg qd X 3 with PT/INR Thurs

## 2019-06-28 NOTE — Assessment & Plan Note (Signed)
Clinically compensated.

## 2019-06-28 NOTE — Patient Instructions (Signed)
See assessment and plan under each diagnosis in the problem list and acutely for this visit 

## 2019-06-28 NOTE — Assessment & Plan Note (Signed)
PT/OT at SNF Risk of Robaxin and Benadryl discussed
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
## 2019-06-29 ENCOUNTER — Non-Acute Institutional Stay (SKILLED_NURSING_FACILITY): Payer: Medicare Other | Admitting: Adult Health

## 2019-06-29 ENCOUNTER — Encounter: Payer: Self-pay | Admitting: Adult Health

## 2019-06-29 ENCOUNTER — Other Ambulatory Visit: Payer: Self-pay | Admitting: *Deleted

## 2019-06-29 DIAGNOSIS — K5909 Other constipation: Secondary | ICD-10-CM | POA: Insufficient documentation

## 2019-06-29 DIAGNOSIS — M797 Fibromyalgia: Secondary | ICD-10-CM

## 2019-06-29 DIAGNOSIS — M0579 Rheumatoid arthritis with rheumatoid factor of multiple sites without organ or systems involvement: Secondary | ICD-10-CM

## 2019-06-29 DIAGNOSIS — I110 Hypertensive heart disease with heart failure: Secondary | ICD-10-CM | POA: Insufficient documentation

## 2019-06-29 NOTE — Patient Outreach (Signed)
Screened for potential [facility] Care Management needs as a benefit of  [employer].  Angela Wong is currently receiving skilled therapy at [facility].   Writer attended telephonic interdisciplinary team meeting to assess for disposition needs and transition plan for resident.   Facility reports member is from home alone. She is a retired Marine scientist. Has complaint of back pain and leg spasms. States member's goal is to return home with use of rollator.   Will continue follow while member resides in SNF. [place] has [place] Management team. Will keep [facility] ECM team updated.    Marthenia Rolling, MSN-Ed, RN,BSN [place] Acute Care Coordinator [phone] [facility]) [phone]  (Toll free office)

## 2019-06-29 NOTE — Progress Notes (Signed)
Location:    [facility] Room Number: 159/P Place of Service:  SNF (31)   CODE STATUS: Full Code  Allergies  Allergen Reactions  . Cephalosporins Hives and Shortness Of Breath  . Ciprofloxacin Hives and Shortness Of Breath  . Diltiazem Shortness Of Breath    Swollen throat   . Doxycycline Hives and Shortness Of Breath  . Horse-Derived Products Anaphylaxis  . Ketek [Telithromycin] Palpitations    Chest discomfort  . Nitrofuran Derivatives Anaphylaxis and Hives    blisters  . Nitrous Oxide Nausea And Vomiting    Severe due to Sjogrens (Auto-Immune Disease)  . Other Anaphylaxis    ALLERGY TO HORSE SERUM  . Penicillins Hives and Shortness Of Breath       . Pentazocine Other (See Comments)    Other reaction(s): Mental Status Changes (intolerance) Altered Mental Status  Altered Mental Status  Altered Mental Status   . Sulfa Antibiotics Hives and Shortness Of Breath  . Trovan [Alatrofloxacin] Palpitations, Other (See Comments) and Anaphylaxis    Chest pain, dizziness, irregular pulse  . Zinc Gelatin [Zinc] Anaphylaxis  . Amlodipine Swelling  . Calcium Channel Blockers     Respiratory distress  . Carvedilol Other (See Comments)    Dizziness, "joint pain, depression"  . Clindamycin/Lincomycin     CP, lock jaw  . Codeine Nausea Only  . Cymbalta [Duloxetine Hcl] Swelling  . Diovan [Valsartan] Swelling  . Lisinopril Swelling  . Sertraline Other (See Comments)    confusion  . Tramadol Nausea Only  . Loteprednol Etabonate Rash    Chief Complaint  Patient presents with  . Acute Visit    Pain Management    HPI:  She is on long term percocet for her pain management. She does have back pain present. She does have crepitus in her joints. She doe shave occasional constipation. She had been using lidoderm patch to her back prior to her hospitalization. 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   Past Medical History:  Diagnosis Date  . A-fib ([place])   . Asthma   . Cerebral vasculitis   . Congestive dilated cardiomyopathy ([place])   . COPD (chronic obstructive pulmonary disease) ([place])   . Fibromyalgia   . Gastric polyp   . GERD (gastroesophageal reflux disease)   . Hiatal hernia   . Hyperparathyroidism ([place])   . IBS (irritable bowel syndrome)   . MVP (mitral valve prolapse)   . Osteoarthritis   . Ovarian cancer ([place])    lymph node removal with hysterectomy  . Raynaud's disease   . RLS (restless legs syndrome)   . Situational depression   . Sjogren's syndrome ([place])   . Vasculitis ([place])   . Vitamin D deficiency     Past Surgical History:  Procedure Laterality Date  . ABDOMINAL HYSTERECTOMY  1982   with right oophorectomy  . APPENDECTOMY    . BREAST BIOPSY Right    x 2  . BREAST SURGERY     Biopsy  . CARPAL TUNNEL RELEASE Right 1980  . CARPAL TUNNEL RELEASE Left 2010   x 2  . CATARACT EXTRACTION Bilateral   . CESAREAN SECTION     x 3  . KNEE SURGERY Left 2005  . OOPHORECTOMY Left 1962  . PACEMAKER INSERTION  09/15/2014  . REFRACTIVE SURGERY Bilateral 2014  . TUBAL LIGATION      Social History   Socioeconomic  History  . Marital status: Single    Spouse name: Not on file  . Number of children: Not on file  . Years of education: Not on file  . Highest education level: Not on file  Occupational History  . Not on file  Tobacco Use  . Smoking status: Never Smoker  . Smokeless tobacco: Never Used  Vaping Use  . Vaping Use: Never used  Substance and Sexual Activity  . Alcohol use: No  . Drug use: No  . Sexual activity: Not on file  Other Topics Concern  . Not on file  Social History Narrative   Lives alone.  Moved from [place].     Caffeine- 6 cups daily, mix of caffeine/decaf   Children- 3   Retired Therapist, sports   Social Determinants of Radio broadcast assistant Strain: High Risk  . Difficulty of Paying Living Expenses: Hard  Food Insecurity:  No Food Insecurity  . Worried About Charity fundraiser in the Last Year: Never true  . Ran Out of Food in the Last Year: Never true  Transportation Needs: Unmet Transportation Needs  . Lack of Transportation (Medical): Yes  . Lack of Transportation (Non-Medical): No  Physical Activity:   . Days of Exercise per Week:   . Minutes of Exercise per Session:   Stress:   . Feeling of Stress :   Social Connections: Socially Isolated  . Frequency of Communication with Friends and Family: Once a week  . Frequency of Social Gatherings with Friends and Family: Once a week  . Attends Religious Services: Never  . Active Member of Clubs or Organizations: No  . Attends Archivist Meetings: Never  . Marital Status: Widowed  Intimate Partner Violence:   . Fear of Current or Ex-Partner:   . Emotionally Abused:   Marland Kitchen Physically Abused:   . Sexually Abused:    Family History  Problem Relation Age of Onset  . COPD Mother   . Breast cancer Mother   . Allergic rhinitis Mother   . Heart disease Father        No details  . Kidney disease Father   . Allergic rhinitis Father   . Stroke Sister   . Arthritis/Rheumatoid Sister   . Asthma Sister   . Lupus Sister   . Heart attack Sister   . Stroke Paternal Grandmother   . Scleroderma Grandchild   . Thyroid disease Other   . Breast cancer Maternal Aunt        x 2      VITAL SIGNS BP 128/74   Pulse 74   Temp 98 F (36.7 C) (Oral)   Ht 5\' 4"  (1.626 m)   Wt 132 lb (59.9 kg)   BMI 22.66 kg/m   Outpatient Encounter Medications as of 06/29/2019  Medication Sig  . albuterol (VENTOLIN HFA) 108 (90 Base) MCG/ACT inhaler INHALE 2 PUFFS BY MOUTH EVERY 6 HOURS AS NEEDED FOR WHEEZING  . atenolol (TENORMIN) 25 MG tablet Take 12.5 mg by mouth daily. *May take additional 12.5mg  daily as needed for A-Fib  . B Complex-C (B-COMPLEX WITH VITAMIN C) tablet Take 1 tablet by mouth every evening.   Roseanne Kaufman Peru-Castor Oil (VENELEX) OINT Apply topically  in the morning, at noon, and at bedtime. Apply to left buttock qshift.  . bumetanide (BUMEX) 0.5 MG tablet Take 0.5 mg by mouth every other day.  . bumetanide (BUMEX) 1 MG tablet Take 1 mg by mouth every other day.  Marland Kitchen  Cholecalciferol (VITAMIN D) 2000 UNITS CAPS Take 2 capsules by mouth every evening.   . diphenhydrAMINE (BENADRYL) 25 MG tablet Take 25 mg by mouth daily as needed for itching or allergies.   Marland Kitchen docusate sodium (COLACE) 100 MG capsule Take 400 mg by mouth every morning.   Marland Kitchen esomeprazole (NEXIUM) 40 MG capsule Take 1 capsule (40 mg total) by mouth 2 (two) times daily.  . fexofenadine (ALLEGRA) 180 MG tablet Take 180 mg by mouth every morning.   . Iron Polysacch Cmplx-B12-FA (POLY-IRON 150 FORTE) 150-0.025-1 MG CAPS Take 1 capsule by mouth daily.  Marland Kitchen levothyroxine (EUTHYROX) 50 MCG tablet Take 2 tablets (100 mcg total) by mouth every morning.  Marland Kitchen losartan (COZAAR) 100 MG tablet Take 1 tablet (100 mg total) by mouth daily.  . Magnesium 400 MG TABS Take 400 mg by mouth every evening.   . methocarbamol (ROBAXIN) 500 MG tablet Take 1 tablet (500 mg total) by mouth 3 (three) times daily.  . NON FORMULARY Diet: _____ Regular, __x____ NAS, _______Consistent Carbohydrate, _______NPO _____Other  . Olopatadine HCl 0.2 % SOLN Apply 1 drop to eye daily as needed (for allergy eye).  Marland Kitchen oxyCODONE-acetaminophen (PERCOCET/ROXICET) 5-325 MG tablet Take 1 tablet by mouth every 6 (six) hours as needed for up to 10 days for moderate pain or severe pain.  . polyethylene glycol (MIRALAX / GLYCOLAX) 17 g packet Take 17 g by mouth daily as needed (For Constipation).  . predniSONE (DELTASONE) 5 MG tablet Take 5 mg by mouth every morning.  . RESTASIS 0.05 % ophthalmic emulsion INSTILL 1 DROP INTO EACH EYE TWICE DAILY  . Simethicone (GAS-X EXTRA STRENGTH) 125 MG CAPS Take 2 capsules by mouth daily as needed (for relief).   . SYMBICORT 160-4.5 MCG/ACT inhaler Inhale 2 puffs by mouth twice daily  . traZODone  (DESYREL) 150 MG tablet Take 1 tablet (150 mg total) by mouth at bedtime.  . vitamin B-12 (CYANOCOBALAMIN) 500 MCG tablet Take 1 tablet (500 mcg total) by mouth daily.  Marland Kitchen warfarin (COUMADIN) 3 MG tablet Take 3 mg by mouth at bedtime.  . [DISCONTINUED] warfarin (COUMADIN) 2 MG tablet Take 2 mg by mouth daily.   No facility-administered encounter medications on file as of 06/29/2019.     SIGNIFICANT DIAGNOSTIC EXAMS   PREVIOUS;   06-22-19: chest x-ray: Cardiomegaly without acute disease.  06-23-19: 2-d echo:  Left ventricular ejection fraction, by estimation, is 50%. The left  ventricle has low normal function. The left ventricle demonstrates global  hypokinesis. There is mild left ventricular hypertrophy. Left ventricular  diastolic parameters are  Indeterminate.  NO NEW EXAMS.   LABS REVIEWED PREVIOUS  03-03-19; tsh 2.760 06-22-19: wbc 8.5; hgb 13.2; hct 38.7; mcv 92.1 plt 225; glucose 101; bun 17; creat 0.74; k+ 3.2; an++ 134; ca 9.1; liver normal albumin 3.8; mag 2.0; BNP 296.0 d-dimer 0.65; INR 2.0  06-24-19: glucose 91; bun 21; creat 1.00; k+ 3.9 na++ 137; ca 8.6 mag 2.0  06-25-19: INR 2.8   NO NEW LABS.   Review of Systems  Constitutional: Negative for malaise/fatigue.  Respiratory: Negative for cough and shortness of breath.   Cardiovascular: Negative for chest pain, palpitations and leg swelling.  Gastrointestinal: Positive for constipation. Negative for abdominal pain and heartburn.  Musculoskeletal: Positive for back pain and myalgias. Negative for joint pain.  Skin: Negative.   Neurological: Negative for dizziness.  Psychiatric/Behavioral: The patient is not nervous/anxious.        ASSESSMENT/ PLAN:  TODAY  1. Chronic  constipation:  2. Fibromyalgia 3. Rheumatoid arthritis of multiple joints with rheumatoid factor  Will continue percocet 5/325 mg every 6 hours as needed Will begin lidoderm 4% patch to back daily Will increase robaxin to 500 mg every 6 hours as  needed Will begin miralax daily as needed   MD is aware of resident's narcotic use and is in agreement with current plan of care. We will attempt to wean resident as appropriate.  Ok Edwards NP [facility]  Contact [phone] Monday through Friday 8am- 5pm  After hours call [phone]

## 2019-07-01 ENCOUNTER — Other Ambulatory Visit: Payer: Self-pay

## 2019-07-01 ENCOUNTER — Encounter (HOSPITAL_COMMUNITY): Payer: Self-pay | Admitting: Emergency Medicine

## 2019-07-01 ENCOUNTER — Emergency Department (HOSPITAL_COMMUNITY): Payer: Medicare Other

## 2019-07-01 ENCOUNTER — Inpatient Hospital Stay (HOSPITAL_COMMUNITY)
Admission: EM | Admit: 2019-07-01 | Discharge: 2019-07-05 | DRG: 281 | Disposition: A | Discharge status: Skilled Nursing Facility | Payer: Medicare Other | Source: Skilled Nursing Facility | Attending: Cardiology | Admitting: Cardiology

## 2019-07-01 ENCOUNTER — Encounter (HOSPITAL_COMMUNITY)
Admission: RE | Admit: 2019-07-01 | Discharge: 2019-07-01 | Disposition: A | Discharge status: Home / Self Care | Payer: Medicare Other | Source: Skilled Nursing Facility | Attending: Adult Health | Admitting: Adult Health

## 2019-07-01 DIAGNOSIS — Z880 Allergy status to penicillin: Secondary | ICD-10-CM

## 2019-07-01 DIAGNOSIS — R791 Abnormal coagulation profile: Secondary | ICD-10-CM | POA: Diagnosis present

## 2019-07-01 DIAGNOSIS — M659 Synovitis and tenosynovitis, unspecified: Secondary | ICD-10-CM | POA: Diagnosis present

## 2019-07-01 DIAGNOSIS — M6281 Muscle weakness (generalized): Secondary | ICD-10-CM | POA: Diagnosis not present

## 2019-07-01 DIAGNOSIS — M47816 Spondylosis without myelopathy or radiculopathy, lumbar region: Secondary | ICD-10-CM | POA: Diagnosis present

## 2019-07-01 DIAGNOSIS — M797 Fibromyalgia: Secondary | ICD-10-CM | POA: Diagnosis present

## 2019-07-01 DIAGNOSIS — M069 Rheumatoid arthritis, unspecified: Secondary | ICD-10-CM | POA: Diagnosis not present

## 2019-07-01 DIAGNOSIS — I495 Sick sinus syndrome: Secondary | ICD-10-CM | POA: Diagnosis present

## 2019-07-01 DIAGNOSIS — Z8543 Personal history of malignant neoplasm of ovary: Secondary | ICD-10-CM

## 2019-07-01 DIAGNOSIS — Z7901 Long term (current) use of anticoagulants: Secondary | ICD-10-CM | POA: Diagnosis not present

## 2019-07-01 DIAGNOSIS — Z803 Family history of malignant neoplasm of breast: Secondary | ICD-10-CM | POA: Diagnosis not present

## 2019-07-01 DIAGNOSIS — Z882 Allergy status to sulfonamides status: Secondary | ICD-10-CM

## 2019-07-01 DIAGNOSIS — G2581 Restless legs syndrome: Secondary | ICD-10-CM | POA: Diagnosis present

## 2019-07-01 DIAGNOSIS — R278 Other lack of coordination: Secondary | ICD-10-CM | POA: Diagnosis not present

## 2019-07-01 DIAGNOSIS — G894 Chronic pain syndrome: Secondary | ICD-10-CM | POA: Diagnosis present

## 2019-07-01 DIAGNOSIS — I4891 Unspecified atrial fibrillation: Secondary | ICD-10-CM

## 2019-07-01 DIAGNOSIS — I2510 Atherosclerotic heart disease of native coronary artery without angina pectoris: Secondary | ICD-10-CM | POA: Diagnosis not present

## 2019-07-01 DIAGNOSIS — I5032 Chronic diastolic (congestive) heart failure: Secondary | ICD-10-CM | POA: Diagnosis not present

## 2019-07-01 DIAGNOSIS — I10 Essential (primary) hypertension: Secondary | ICD-10-CM

## 2019-07-01 DIAGNOSIS — Z20822 Contact with and (suspected) exposure to covid-19: Secondary | ICD-10-CM | POA: Diagnosis present

## 2019-07-01 DIAGNOSIS — I420 Dilated cardiomyopathy: Secondary | ICD-10-CM | POA: Diagnosis not present

## 2019-07-01 DIAGNOSIS — I4819 Other persistent atrial fibrillation: Secondary | ICD-10-CM | POA: Diagnosis not present

## 2019-07-01 DIAGNOSIS — I110 Hypertensive heart disease with heart failure: Secondary | ICD-10-CM | POA: Diagnosis not present

## 2019-07-01 DIAGNOSIS — Z888 Allergy status to other drugs, medicaments and biological substances status: Secondary | ICD-10-CM

## 2019-07-01 DIAGNOSIS — Z832 Family history of diseases of the blood and blood-forming organs and certain disorders involving the immune mechanism: Secondary | ICD-10-CM

## 2019-07-01 DIAGNOSIS — J449 Chronic obstructive pulmonary disease, unspecified: Secondary | ICD-10-CM | POA: Diagnosis present

## 2019-07-01 DIAGNOSIS — Z7401 Bed confinement status: Secondary | ICD-10-CM | POA: Diagnosis not present

## 2019-07-01 DIAGNOSIS — I214 Non-ST elevation (NSTEMI) myocardial infarction: Principal | ICD-10-CM | POA: Diagnosis present

## 2019-07-01 DIAGNOSIS — K219 Gastro-esophageal reflux disease without esophagitis: Secondary | ICD-10-CM | POA: Diagnosis present

## 2019-07-01 DIAGNOSIS — K5900 Constipation, unspecified: Secondary | ICD-10-CM | POA: Diagnosis not present

## 2019-07-01 DIAGNOSIS — Z950 Presence of cardiac pacemaker: Secondary | ICD-10-CM | POA: Diagnosis not present

## 2019-07-01 DIAGNOSIS — I7300 Raynaud's syndrome without gangrene: Secondary | ICD-10-CM | POA: Diagnosis present

## 2019-07-01 DIAGNOSIS — Z7952 Long term (current) use of systemic steroids: Secondary | ICD-10-CM

## 2019-07-01 DIAGNOSIS — I5033 Acute on chronic diastolic (congestive) heart failure: Secondary | ICD-10-CM | POA: Diagnosis not present

## 2019-07-01 DIAGNOSIS — F329 Major depressive disorder, single episode, unspecified: Secondary | ICD-10-CM | POA: Diagnosis not present

## 2019-07-01 DIAGNOSIS — Z841 Family history of disorders of kidney and ureter: Secondary | ICD-10-CM

## 2019-07-01 DIAGNOSIS — I472 Ventricular tachycardia: Secondary | ICD-10-CM | POA: Diagnosis not present

## 2019-07-01 DIAGNOSIS — Z823 Family history of stroke: Secondary | ICD-10-CM

## 2019-07-01 DIAGNOSIS — Z79890 Hormone replacement therapy (postmenopausal): Secondary | ICD-10-CM

## 2019-07-01 DIAGNOSIS — M3500 Sicca syndrome, unspecified: Secondary | ICD-10-CM | POA: Diagnosis present

## 2019-07-01 DIAGNOSIS — Z90721 Acquired absence of ovaries, unilateral: Secondary | ICD-10-CM

## 2019-07-01 DIAGNOSIS — Z825 Family history of asthma and other chronic lower respiratory diseases: Secondary | ICD-10-CM

## 2019-07-01 DIAGNOSIS — Z881 Allergy status to other antibiotic agents status: Secondary | ICD-10-CM

## 2019-07-01 DIAGNOSIS — I493 Ventricular premature depolarization: Secondary | ICD-10-CM | POA: Diagnosis not present

## 2019-07-01 DIAGNOSIS — E213 Hyperparathyroidism, unspecified: Secondary | ICD-10-CM | POA: Diagnosis not present

## 2019-07-01 DIAGNOSIS — I4811 Longstanding persistent atrial fibrillation: Secondary | ICD-10-CM | POA: Diagnosis not present

## 2019-07-01 DIAGNOSIS — E785 Hyperlipidemia, unspecified: Secondary | ICD-10-CM | POA: Diagnosis present

## 2019-07-01 DIAGNOSIS — Z90710 Acquired absence of both cervix and uterus: Secondary | ICD-10-CM

## 2019-07-01 DIAGNOSIS — R2681 Unsteadiness on feet: Secondary | ICD-10-CM | POA: Diagnosis not present

## 2019-07-01 DIAGNOSIS — M2550 Pain in unspecified joint: Secondary | ICD-10-CM | POA: Diagnosis not present

## 2019-07-01 DIAGNOSIS — I4821 Permanent atrial fibrillation: Secondary | ICD-10-CM | POA: Diagnosis not present

## 2019-07-01 DIAGNOSIS — Z8249 Family history of ischemic heart disease and other diseases of the circulatory system: Secondary | ICD-10-CM

## 2019-07-01 DIAGNOSIS — R079 Chest pain, unspecified: Secondary | ICD-10-CM | POA: Diagnosis present

## 2019-07-01 DIAGNOSIS — E039 Hypothyroidism, unspecified: Secondary | ICD-10-CM | POA: Diagnosis not present

## 2019-07-01 DIAGNOSIS — R531 Weakness: Secondary | ICD-10-CM | POA: Diagnosis not present

## 2019-07-01 DIAGNOSIS — Z5181 Encounter for therapeutic drug level monitoring: Secondary | ICD-10-CM | POA: Diagnosis not present

## 2019-07-01 DIAGNOSIS — E559 Vitamin D deficiency, unspecified: Secondary | ICD-10-CM | POA: Diagnosis present

## 2019-07-01 DIAGNOSIS — I25118 Atherosclerotic heart disease of native coronary artery with other forms of angina pectoris: Secondary | ICD-10-CM | POA: Diagnosis not present

## 2019-07-01 DIAGNOSIS — Z79899 Other long term (current) drug therapy: Secondary | ICD-10-CM

## 2019-07-01 DIAGNOSIS — R2689 Other abnormalities of gait and mobility: Secondary | ICD-10-CM | POA: Diagnosis not present

## 2019-07-01 LAB — CBC WITH DIFFERENTIAL/PLATELET
Abs Immature Granulocytes: 0.06 10*3/uL (ref 0.00–0.07)
Basophils Absolute: 0 10*3/uL (ref 0.0–0.1)
Basophils Relative: 0 %
Eosinophils Absolute: 0.1 10*3/uL (ref 0.0–0.5)
Eosinophils Relative: 1 %
HCT: 36.9 % (ref 36.0–46.0)
Hemoglobin: 12 g/dL (ref 12.0–15.0)
Immature Granulocytes: 1 %
Lymphocytes Relative: 13 %
Lymphs Abs: 1.4 10*3/uL (ref 0.7–4.0)
MCH: 30.8 pg (ref 26.0–34.0)
MCHC: 32.5 g/dL (ref 30.0–36.0)
MCV: 94.9 fL (ref 80.0–100.0)
Monocytes Absolute: 0.8 10*3/uL (ref 0.1–1.0)
Monocytes Relative: 7 %
Neutro Abs: 8.7 10*3/uL — ABNORMAL HIGH (ref 1.7–7.7)
Neutrophils Relative %: 78 %
Platelets: 191 10*3/uL (ref 150–400)
RBC: 3.89 MIL/uL (ref 3.87–5.11)
RDW: 14.9 % (ref 11.5–15.5)
WBC: 11.1 10*3/uL — ABNORMAL HIGH (ref 4.0–10.5)
nRBC: 0 % (ref 0.0–0.2)

## 2019-07-01 LAB — BASIC METABOLIC PANEL
Anion gap: 11 (ref 5–15)
BUN: 19 mg/dL (ref 8–23)
CO2: 30 mmol/L (ref 22–32)
Calcium: 8.8 mg/dL — ABNORMAL LOW (ref 8.9–10.3)
Chloride: 94 mmol/L — ABNORMAL LOW (ref 98–111)
Creatinine, Ser: 0.78 mg/dL (ref 0.44–1.00)
GFR calc Af Amer: >60 mL/min (ref >60)
GFR calc non Af Amer: >60 mL/min (ref >60)
Glucose, Bld: 116 mg/dL — ABNORMAL HIGH (ref 70–99)
Potassium: 3.8 mmol/L (ref 3.5–5.1)
Sodium: 135 mmol/L (ref 135–145)

## 2019-07-01 LAB — PROTIME-INR
INR: 1.6 — ABNORMAL HIGH (ref 0.8–1.2)
INR: 1.5 — ABNORMAL HIGH (ref 0.8–1.2)
Prothrombin Time: 18.3 seconds — ABNORMAL HIGH (ref 11.4–15.2)
Prothrombin Time: 17.8 seconds — ABNORMAL HIGH (ref 11.4–15.2)

## 2019-07-01 LAB — HEPARIN LEVEL (UNFRACTIONATED): Heparin Unfractionated: 0.15 IU/mL — ABNORMAL LOW (ref 0.30–0.70)

## 2019-07-01 LAB — SARS CORONAVIRUS 2 BY RT PCR (HOSPITAL ORDER, PERFORMED IN ~~LOC~~ HOSPITAL LAB): SARS Coronavirus 2: NEGATIVE

## 2019-07-01 LAB — TROPONIN I (HIGH SENSITIVITY)
Troponin I (High Sensitivity): 9 ng/L (ref <18)
Troponin I (High Sensitivity): 2277 ng/L (ref <18)

## 2019-07-01 MED ORDER — METHOCARBAMOL 500 MG PO TABS
500.0000 mg | ORAL_TABLET | Freq: Three times a day (TID) | ORAL | Status: DC
  Administered 2019-07-01 – 2019-07-05 (×12): 500 mg via ORAL
  Filled 2019-07-01 (×12): qty 1

## 2019-07-01 MED ORDER — ASPIRIN 81 MG PO CHEW
324.0000 mg | CHEWABLE_TABLET | Freq: Once | ORAL | Status: AC
  Administered 2019-07-01: 324 mg via ORAL
  Filled 2019-07-01: qty 4

## 2019-07-01 MED ORDER — PREDNISONE 5 MG PO TABS
5.0000 mg | ORAL_TABLET | Freq: Every morning | ORAL | Status: DC
  Administered 2019-07-02 – 2019-07-05 (×4): 5 mg via ORAL
  Filled 2019-07-01 (×4): qty 1

## 2019-07-01 MED ORDER — ACETAMINOPHEN 325 MG PO TABS
650.0000 mg | ORAL_TABLET | ORAL | Status: DC | PRN
  Administered 2019-07-02: 650 mg via ORAL
  Filled 2019-07-01: qty 2

## 2019-07-01 MED ORDER — SODIUM CHLORIDE 0.9 % WEIGHT BASED INFUSION
1.0000 mL/kg/h | INTRAVENOUS | Status: DC
  Administered 2019-07-02: 1 mL/kg/h via INTRAVENOUS

## 2019-07-01 MED ORDER — PREDNISONE 5 MG PO TABS: 5.0000 mg | ORAL_TABLET | Freq: Every morning | ORAL | Status: DC

## 2019-07-01 MED ORDER — LORATADINE 10 MG PO TABS
10.0000 mg | ORAL_TABLET | Freq: Every day | ORAL | Status: DC
  Administered 2019-07-01 – 2019-07-05 (×5): 10 mg via ORAL
  Filled 2019-07-01 (×5): qty 1

## 2019-07-01 MED ORDER — SODIUM CHLORIDE 0.9 % WEIGHT BASED INFUSION
3.0000 mL/kg/h | INTRAVENOUS | Status: DC
  Administered 2019-07-02: 3 mL/kg/h via INTRAVENOUS

## 2019-07-01 MED ORDER — ALBUTEROL SULFATE (2.5 MG/3ML) 0.083% IN NEBU: 2.5000 mg | INHALATION_SOLUTION | Freq: Four times a day (QID) | RESPIRATORY_TRACT | Status: DC | PRN

## 2019-07-01 MED ORDER — HEPARIN BOLUS VIA INFUSION
1500.0000 [IU] | Freq: Once | INTRAVENOUS | Status: AC
  Administered 2019-07-02: 1500 [IU] via INTRAVENOUS
  Filled 2019-07-01: qty 1500

## 2019-07-01 MED ORDER — DOCUSATE SODIUM 100 MG PO CAPS
400.0000 mg | ORAL_CAPSULE | Freq: Every morning | ORAL | Status: DC
  Administered 2019-07-01 – 2019-07-05 (×5): 400 mg via ORAL
  Filled 2019-07-01 (×5): qty 4

## 2019-07-01 MED ORDER — HEPARIN (PORCINE) 25000 UT/250ML-% IV SOLN
900.0000 [IU]/h | INTRAVENOUS | Status: DC
  Administered 2019-07-01: 700 [IU]/h via INTRAVENOUS
  Filled 2019-07-01: qty 250

## 2019-07-01 MED ORDER — CYCLOSPORINE 0.05 % OP EMUL
1.0000 [drp] | Freq: Two times a day (BID) | OPHTHALMIC | Status: DC
  Administered 2019-07-01 – 2019-07-05 (×8): 1 [drp] via OPHTHALMIC
  Filled 2019-07-01 (×9): qty 1

## 2019-07-01 MED ORDER — SODIUM CHLORIDE 0.9% FLUSH
3.0000 mL | Freq: Two times a day (BID) | INTRAVENOUS | Status: DC
  Administered 2019-07-01 – 2019-07-03 (×3): 3 mL via INTRAVENOUS

## 2019-07-01 MED ORDER — ONDANSETRON HCL 4 MG/2ML IJ SOLN
4.0000 mg | Freq: Four times a day (QID) | INTRAMUSCULAR | Status: DC | PRN
  Administered 2019-07-01 – 2019-07-04 (×2): 4 mg via INTRAVENOUS
  Filled 2019-07-01: qty 2

## 2019-07-01 MED ORDER — NITROGLYCERIN IN D5W 200-5 MCG/ML-% IV SOLN
0.0000 ug/min | INTRAVENOUS | Status: DC
  Administered 2019-07-01: 10 ug/min via INTRAVENOUS
  Filled 2019-07-01: qty 250

## 2019-07-01 MED ORDER — SODIUM CHLORIDE 0.9% FLUSH: 3.0000 mL | INTRAVENOUS | Status: DC | PRN

## 2019-07-01 MED ORDER — HYDROMORPHONE HCL 1 MG/ML IJ SOLN
1.0000 mg | Freq: Once | INTRAMUSCULAR | Status: AC
  Administered 2019-07-01: 1 mg via INTRAVENOUS
  Filled 2019-07-01: qty 1

## 2019-07-01 MED ORDER — HEPARIN (PORCINE) 25000 UT/250ML-% IV SOLN: 14.0000 [IU]/kg/h | INTRAVENOUS | Status: DC

## 2019-07-01 MED ORDER — HEPARIN SODIUM (PORCINE) 5000 UNIT/ML IJ SOLN
60.0000 [IU]/kg | Freq: Once | INTRAMUSCULAR | Status: AC
  Administered 2019-07-01: 3600 [IU] via INTRAVENOUS
  Filled 2019-07-01: qty 1

## 2019-07-01 MED ORDER — TRAZODONE HCL 50 MG PO TABS
150.0000 mg | ORAL_TABLET | Freq: Every day | ORAL | Status: DC
  Administered 2019-07-01 – 2019-07-04 (×4): 150 mg via ORAL
  Filled 2019-07-01 (×4): qty 1

## 2019-07-01 MED ORDER — MORPHINE SULFATE (PF) 2 MG/ML IV SOLN
2.0000 mg | INTRAVENOUS | Status: DC | PRN
  Administered 2019-07-01 – 2019-07-05 (×7): 2 mg via INTRAVENOUS
  Filled 2019-07-01 (×7): qty 1

## 2019-07-01 MED ORDER — SODIUM CHLORIDE 0.9 % IV SOLN: 250.0000 mL | INTRAVENOUS | Status: DC | PRN

## 2019-07-01 MED ORDER — ASPIRIN 81 MG PO CHEW
81.0000 mg | CHEWABLE_TABLET | ORAL | Status: AC
  Administered 2019-07-01: 81 mg via ORAL

## 2019-07-01 MED ORDER — ONDANSETRON HCL 4 MG/2ML IJ SOLN
4.0000 mg | Freq: Once | INTRAMUSCULAR | Status: AC
  Administered 2019-07-01: 4 mg via INTRAVENOUS
  Filled 2019-07-01: qty 2

## 2019-07-01 MED ORDER — ASPIRIN EC 81 MG PO TBEC
81.0000 mg | DELAYED_RELEASE_TABLET | Freq: Every day | ORAL | Status: DC
  Administered 2019-07-03 – 2019-07-05 (×3): 81 mg via ORAL
  Filled 2019-07-01 (×4): qty 1

## 2019-07-01 MED ORDER — PANTOPRAZOLE SODIUM 40 MG PO TBEC
40.0000 mg | DELAYED_RELEASE_TABLET | Freq: Every day | ORAL | Status: DC
  Administered 2019-07-01 – 2019-07-05 (×5): 40 mg via ORAL
  Filled 2019-07-01 (×5): qty 1

## 2019-07-01 MED ORDER — LEVOTHYROXINE SODIUM 100 MCG PO TABS
100.0000 ug | ORAL_TABLET | Freq: Every day | ORAL | Status: DC
  Administered 2019-07-02 – 2019-07-05 (×4): 100 ug via ORAL
  Filled 2019-07-01 (×4): qty 1

## 2019-07-01 MED ORDER — IOHEXOL 350 MG/ML SOLN
100.0000 mL | Freq: Once | INTRAVENOUS | Status: AC | PRN
  Administered 2019-07-01: 100 mL via INTRAVENOUS

## 2019-07-01 MED ORDER — ALBUTEROL SULFATE HFA 108 (90 BASE) MCG/ACT IN AERS: 2.0000 | INHALATION_SPRAY | Freq: Four times a day (QID) | RESPIRATORY_TRACT | Status: DC | PRN

## 2019-07-01 MED ORDER — MAGNESIUM 400 MG PO TABS: 400.0000 mg | ORAL_TABLET | Freq: Every evening | ORAL | Status: DC

## 2019-07-01 MED ORDER — MAGNESIUM OXIDE 400 (241.3 MG) MG PO TABS
400.0000 mg | ORAL_TABLET | Freq: Every evening | ORAL | Status: DC
  Administered 2019-07-01 – 2019-07-05 (×5): 400 mg via ORAL
  Filled 2019-07-01 (×6): qty 1

## 2019-07-01 MED ORDER — FLUTICASONE FUROATE-VILANTEROL 200-25 MCG/INH IN AEPB
1.0000 | INHALATION_SPRAY | Freq: Every day | RESPIRATORY_TRACT | Status: DC
  Filled 2019-07-01: qty 28

## 2019-07-01 MED ORDER — ATORVASTATIN CALCIUM 40 MG PO TABS
40.0000 mg | ORAL_TABLET | Freq: Every day | ORAL | Status: DC
  Administered 2019-07-01 – 2019-07-05 (×5): 40 mg via ORAL
  Filled 2019-07-01 (×6): qty 1

## 2019-07-01 MED ORDER — ATENOLOL 25 MG PO TABS
12.5000 mg | ORAL_TABLET | Freq: Every day | ORAL | Status: DC
  Filled 2019-07-01: qty 1

## 2019-07-01 MED ORDER — NITROGLYCERIN 0.4 MG SL SUBL
0.4000 mg | SUBLINGUAL_TABLET | SUBLINGUAL | Status: DC | PRN
  Filled 2019-07-01 (×3): qty 1

## 2019-07-01 NOTE — Progress Notes (Signed)
ANTICOAGULATION CONSULT NOTE - Initial Consult  Pharmacy Consult for Heparin Indication: chest pain/ACS  Allergies  Allergen Reactions  . Cephalosporins Hives and Shortness Of Breath  . Ciprofloxacin Hives and Shortness Of Breath  . Diltiazem Shortness Of Breath    Swollen throat   . Doxycycline Hives and Shortness Of Breath  . Horse-Derived Products Anaphylaxis  . Ketek [Telithromycin] Palpitations    Chest discomfort  . Nitrofuran Derivatives Anaphylaxis and Hives    blisters  . Nitrous Oxide Nausea And Vomiting    Severe due to Sjogrens (Auto-Immune Disease)  . Other Anaphylaxis    ALLERGY TO HORSE SERUM  . Penicillins Hives and Shortness Of Breath       . Pentazocine Other (See Comments)    Other reaction(s): Mental Status Changes (intolerance) Altered Mental Status  Altered Mental Status  Altered Mental Status   . Sulfa Antibiotics Hives and Shortness Of Breath  . Trovan [Alatrofloxacin] Palpitations, Other (See Comments) and Anaphylaxis    Chest pain, dizziness, irregular pulse  . Zinc Gelatin [Zinc] Anaphylaxis  . Amlodipine Swelling  . Calcium Channel Blockers     Respiratory distress  . Carvedilol Other (See Comments)    Dizziness, "joint pain, depression"  . Clindamycin/Lincomycin     CP, lock jaw  . Codeine Nausea Only  . Cymbalta [Duloxetine Hcl] Swelling  . Diovan [Valsartan] Swelling  . Lisinopril Swelling  . Sertraline Other (See Comments)    confusion  . Tramadol Nausea Only  . Loteprednol Etabonate Rash    Patient Measurements: Height: 5\' 4"  (162.6 cm) Weight: 59.9 kg (132 lb) IBW/kg (Calculated) : 54.7 HEPARIN DW (KG): 59.9  Vital Signs: Temp: 98.1 F (36.7 C) (06/17 0922) Temp Source: Oral (06/17 0922) BP: 129/57 (06/17 0922) Pulse Rate: 62 (06/17 0922)  Labs: Recent Labs    07/01/19 0500 07/01/19 0933 07/01/19 1153 07/01/19 1330  HGB  --  12.0  --   --   HCT  --  36.9  --   --   PLT  --  191  --   --   LABPROT 18.3*  --    --  17.8*  INR 1.6*  --   --  1.5*  CREATININE  --  0.78  --   --   TROPONINIHS  --  9 2,277*  --     Estimated Creatinine Clearance: 49.2 mL/min (by C-G formula based on SCr of 0.78 mg/dL).   Medical History: Past Medical History:  Diagnosis Date  . A-fib ([place])   . Asthma   . Cerebral vasculitis   . Congestive dilated cardiomyopathy ([place])   . COPD (chronic obstructive pulmonary disease) ([place])   . Fibromyalgia   . Gastric polyp   . GERD (gastroesophageal reflux disease)   . Hiatal hernia   . Hyperparathyroidism ([place])   . IBS (irritable bowel syndrome)   . MVP (mitral valve prolapse)   . Osteoarthritis   . Ovarian cancer ([place])    lymph node removal with hysterectomy  . Raynaud's disease   . RLS (restless legs syndrome)   . Situational depression   . Sjogren's syndrome ([place])   . Vasculitis ([place])   . Vitamin D deficiency     Medications:  See med rec, patient on Coumadin PTA INR 1.5  Assessment: Patient presented to ED with chest pain. Chronically on coumadin for afib, INR is 1.5 and subtherapeutic. Troponins elevated. Pharmacy asked to start heparin.  Goal of Therapy:  Heparin level 0.3-0.7 units/ml Monitor platelets by  anticoagulation protocol: Yes   Plan:  Give 3600 units bolus x 1 Start heparin infusion at 700 units/hr Check anti-Xa level in ~8 hours and daily while on heparin Continue to monitor H&H and platelets  Isac Sarna, BS Vena Austria, BCPS Clinical Pharmacist Pager [phone] 07/01/2019,2:17 PM

## 2019-07-01 NOTE — ED Notes (Signed)
[facility] report given 

## 2019-07-01 NOTE — ED Provider Notes (Signed)
[facility] EMERGENCY DEPARTMENT Provider Note   CSN: [id number] Arrival date & time: 07/01/19  4098     History Chief Complaint  Patient presents with   Chest Pain    Angela Wong is a 79 y.o. female.  Patient sent over from [facility].  Patient states that at 8 AM started with middle chest pain bilateral tingling in arms and nausea the pain did radiate to her upper back area.  She has a history of chronic low back pain.  Patient recently had her oxycodone stopped.  Patient denies any shortness of breath.  Oxygen saturations are 100%.  Patient states that the pain is 8 out of 10.  Patient with a long list of allergies.  Blood pressures 129/57.  Respirations 20.  Heart rate 62.  Patient does have a pacemaker.  Patient has Sjogren's syndrome atrial fibrillation patient is on Coumadin for that.  Also has a history of COPD fibromyalgia and congestive dilated cardiomyopathy.        Past Medical History:  Diagnosis Date   A-fib ([place])    Asthma    Cerebral vasculitis    Congestive dilated cardiomyopathy (HCC)    COPD (chronic obstructive pulmonary disease) (HCC)    Fibromyalgia    Gastric polyp    GERD (gastroesophageal reflux disease)    Hiatal hernia    Hyperparathyroidism (HCC)    IBS (irritable bowel syndrome)    MVP (mitral valve prolapse)    Osteoarthritis    Ovarian cancer (HCC)    lymph node removal with hysterectomy   Raynaud's disease    RLS (restless legs syndrome)    Situational depression    Sjogren's syndrome ([place])    Vasculitis ([place])    Vitamin D deficiency     Patient Active Problem List   Diagnosis Date Noted   Non-STEMI (non-ST elevated myocardial infarction) ([place]) 07/01/2019   Chronic constipation 06/29/2019   Hypertensive heart disease with heart failure ([place]) 06/29/2019   Decompensated heart failure ([place]) 06/22/2019   Pressure injury of skin 06/22/2019   RA (rheumatoid arthritis) ([place]) 05/04/2019   Anticoagulation  goal of INR 2 to 3 12/18/2017   Depression, major, single episode, moderate (HCC) 09/12/2017   Chest pain 08/25/2017   Generalized weakness 08/25/2017   Seasonal and perennial allergic rhinitis 12/31/2016   Allergic rhinitis with a nonallergic component 10/01/2016   Itching 10/01/2016   Skeeter syndrome 10/01/2016   Steroid-induced osteopenia 07/11/2016   Hyperparathyroidism ([place]) 01/16/2016   Iron deficiency anemia 12/18/2015   Insomnia 12/18/2015   Hypothyroidism 12/18/2015   Rotator cuff tear 03/23/2014   Osteoarthritis    Moderate persistent asthma without complication    Ovarian cancer ([place])    Fibromyalgia    RLS (restless legs syndrome)    Cerebral vasculitis    Sjogren's syndrome ([place])    Raynaud's disease    MVP (mitral valve prolapse)    IBS (irritable bowel syndrome)    Congestive dilated cardiomyopathy ([place]) 02/09/2014   Dyspnea 12/13/2013   Hyperlipidemia 11/26/2013   Malaise and fatigue 11/26/2013   Allergy to multiple antibiotics 11/02/2013   History of ovarian cancer 10/01/2013   GERD (gastroesophageal reflux disease) 10/01/2013   Benign essential HTN 10/01/2013   Atrial fibrillation, permanent ([place]) 07/27/2013    Past Surgical History:  Procedure Laterality Date   ABDOMINAL HYSTERECTOMY  1982   with right oophorectomy   APPENDECTOMY     BREAST BIOPSY Right    x 2   BREAST SURGERY  Biopsy   CARPAL TUNNEL RELEASE Right 1980   CARPAL TUNNEL RELEASE Left 2010   x 2   CATARACT EXTRACTION Bilateral    CESAREAN SECTION     x 3   KNEE SURGERY Left 2005   OOPHORECTOMY Left 1962   PACEMAKER INSERTION  09/15/2014   REFRACTIVE SURGERY Bilateral 2014   TUBAL LIGATION       OB History    Gravida      Para      Term      Preterm      AB      Living  3     SAB      TAB      Ectopic      Multiple      Live Births              Family History  Problem Relation Age of Onset   COPD  Mother    Breast cancer Mother    Allergic rhinitis Mother    Heart disease Father        No details   Kidney disease Father    Allergic rhinitis Father    Stroke Sister    Arthritis/Rheumatoid Sister    Asthma Sister    Lupus Sister    Heart attack Sister    Stroke Paternal Grandmother    Scleroderma Grandchild    Thyroid disease Other    Breast cancer Maternal Aunt        x 2    Social History   Tobacco Use   Smoking status: Never Smoker   Smokeless tobacco: Never Used  Vaping Use   Vaping Use: Never used  Substance Use Topics   Alcohol use: No   Drug use: No    Home Medications Prior to Admission medications   Medication Sig Start Date End Date Taking? Authorizing Provider  albuterol (VENTOLIN HFA) 108 (90 Base) MCG/ACT inhaler INHALE 2 PUFFS BY MOUTH EVERY 6 HOURS AS NEEDED FOR WHEEZING Patient taking differently: Inhale 2 puffs into the lungs every 6 (six) hours as needed for wheezing or shortness of breath. INHALE 2 PUFFS BY MOUTH EVERY 6 HOURS AS NEEDED FOR WHEEZING 08/10/18  Yes Terald Sleeper, PA-C  atenolol (TENORMIN) 25 MG tablet Take 12.5 mg by mouth daily. *May take additional 12.5mg  daily as needed for A-Fib 03/11/16  Yes [provider]  B Complex-C (B-COMPLEX WITH VITAMIN C) tablet Take 1 tablet by mouth every evening.    Yes [provider]  bumetanide (BUMEX) 0.5 MG tablet Take 0.5 mg by mouth every other day. 06/26/19  Yes [provider]  bumetanide (BUMEX) 1 MG tablet Take 1 mg by mouth every other day. 06/25/19  Yes [provider]  Cholecalciferol (VITAMIN D) 2000 UNITS CAPS Take 2 capsules by mouth every evening.    Yes [provider]  diphenhydrAMINE (BENADRYL) 25 MG tablet Take 25 mg by mouth daily as needed for itching or allergies.    Yes [provider]  docusate sodium (COLACE) 100 MG capsule Take 400 mg by mouth every morning.    Yes [provider]  esomeprazole  (NEXIUM) 40 MG capsule Take 1 capsule (40 mg total) by mouth 2 (two) times daily. 01/04/19  Yes Pyrtle, Lajuan Lines, MD  fexofenadine (ALLEGRA) 180 MG tablet Take 180 mg by mouth every morning.    Yes [provider]  Iron Polysacch Cmplx-B12-FA (POLY-IRON 150 FORTE) 150-0.025-1 MG CAPS Take 1 capsule by  mouth daily. 01/04/19  Yes Hawks, Christy A, FNP  levothyroxine (EUTHYROX) 50 MCG tablet Take 2 tablets (100 mcg total) by mouth every morning. 01/04/19  Yes Hawks, Christy A, FNP  Lidocaine (HM LIDOCAINE PATCH) 4 % PTCH Apply 1 application topically daily.   Yes [provider]  losartan (COZAAR) 100 MG tablet Take 1 tablet (100 mg total) by mouth daily. 01/04/19  Yes Hawks, Christy A, FNP  Magnesium 400 MG TABS Take 400 mg by mouth every evening.    Yes [provider]  melatonin 3 MG TABS tablet Take 3 mg by mouth at bedtime.   Yes [provider]  methocarbamol (ROBAXIN) 500 MG tablet Take 1 tablet (500 mg total) by mouth 3 (three) times daily. 06/24/19  Yes Shah, Pratik D, DO  Olopatadine HCl 0.2 % SOLN Apply 1 drop to eye daily as needed (for allergy eye). 07/31/18  Yes Valentina Shaggy, MD  oxyCODONE-acetaminophen (PERCOCET/ROXICET) 5-325 MG tablet Take 1 tablet by mouth every 6 (six) hours as needed for up to 10 days for moderate pain or severe pain. 06/24/19 07/04/19 Yes Shah, Pratik D, DO  polyethylene glycol (MIRALAX / GLYCOLAX) 17 g packet Take 17 g by mouth daily as needed for mild constipation or moderate constipation (For Constipation).  06/29/19  Yes [provider]  predniSONE (DELTASONE) 5 MG tablet Take 5 mg by mouth every morning.   Yes [provider]  RESTASIS 0.05 % ophthalmic emulsion INSTILL 1 DROP INTO EACH EYE TWICE DAILY Patient taking differently: Place 1 drop into both eyes 2 (two) times daily.  11/30/18  Yes Hawks, Christy A, FNP  Simethicone (GAS-X EXTRA STRENGTH) 125 MG CAPS Take 2 capsules by mouth daily as needed (for  gas).    Yes [provider]  SYMBICORT 160-4.5 MCG/ACT inhaler Inhale 2 puffs by mouth twice daily Patient taking differently: Inhale 2 puffs into the lungs in the morning and at bedtime.  04/30/19  Yes Valentina Shaggy, MD  traZODone (DESYREL) 150 MG tablet Take 1 tablet (150 mg total) by mouth at bedtime. 01/04/19  Yes Hawks, Christy A, FNP  vitamin B-12 (CYANOCOBALAMIN) 500 MCG tablet Take 1 tablet (500 mcg total) by mouth daily. 05/04/19  Yes Hawks, Christy A, FNP  warfarin (COUMADIN) 1 MG tablet Take 1 mg by mouth daily. Take 4mg  MWF and 2mg  TTSS per patient report last PTA dosing   Yes [provider]  Roseanne Kaufman Peru-Castor Oil (VENELEX) OINT Apply topically in the morning, at noon, and at bedtime. Apply to left buttock qshift. Patient not taking: Reported on 07/01/2019 06/25/19   [provider]  NON FORMULARY Diet: _____ Regular, __x____ NAS, _______Consistent Carbohydrate, _______NPO _____Other Patient not taking: Reported on 07/01/2019 06/24/19   [provider]    Allergies    Cephalosporins, Ciprofloxacin, Diltiazem, Doxycycline, Horse-derived products, Ketek [telithromycin], Nitrofuran derivatives, Nitrous oxide, Other, Penicillins, Pentazocine, Sulfa antibiotics, Trovan [alatrofloxacin], Zinc gelatin [zinc], Amlodipine, Calcium channel blockers, Carvedilol, Clindamycin/lincomycin, Codeine, Cymbalta [duloxetine hcl], Diovan [valsartan], Lisinopril, Sertraline, Tramadol, and Loteprednol etabonate  Review of Systems   Review of Systems  Constitutional: Negative for chills and fever.  HENT: Negative for rhinorrhea and sore throat.   Eyes: Negative for visual disturbance.  Respiratory: Negative for cough and shortness of breath.   Cardiovascular: Positive for chest pain. Negative for leg swelling.  Gastrointestinal: Positive for nausea. Negative for abdominal pain, diarrhea and vomiting.  Genitourinary: Negative for dysuria.  Musculoskeletal:  Positive for back pain. Negative for neck  pain.  Skin: Negative for rash.  Neurological: Negative for dizziness, light-headedness and headaches.  Hematological: Does not bruise/bleed easily.  Psychiatric/Behavioral: Negative for confusion.    Physical Exam Updated Vital Signs BP (!) 99/47 (BP Location: Right Arm)    Pulse 66    Temp 99.4 F (37.4 C) (Oral)    Resp 20    Ht 1.626 m (5\' 4" )    Wt 62.4 kg    SpO2 93%    BMI 23.61 kg/m   Physical Exam Vitals and nursing note reviewed.  Constitutional:      General: She is not in acute distress.    Appearance: Normal appearance. She is well-developed.  HENT:     Head: Normocephalic and atraumatic.  Eyes:     Extraocular Movements: Extraocular movements intact.     Conjunctiva/sclera: Conjunctivae normal.     Pupils: Pupils are equal, round, and reactive to light.  Cardiovascular:     Rate and Rhythm: Normal rate and regular rhythm.     Heart sounds: No murmur heard.   Pulmonary:     Effort: Pulmonary effort is normal. No respiratory distress.     Breath sounds: Normal breath sounds.  Chest:     Chest wall: No tenderness.  Abdominal:     Palpations: Abdomen is soft.     Tenderness: There is no abdominal tenderness.  Musculoskeletal:     Cervical back: Normal range of motion and neck supple.  Skin:    General: Skin is warm and dry.     Capillary Refill: Capillary refill takes less than 2 seconds.  Neurological:     General: No focal deficit present.     Mental Status: She is alert and oriented to person, place, and time.     Cranial Nerves: No cranial nerve deficit.     Sensory: No sensory deficit.     Motor: Weakness present.     Comments: Baseline weakness to lower extremities.     ED Results / Procedures / Treatments   Labs (all labs ordered are listed, but only abnormal results are displayed) Labs Reviewed  BASIC METABOLIC PANEL - Abnormal; Notable for the following components:      Result Value   Chloride 94 (*)     Glucose, Bld 116 (*)    Calcium 8.8 (*)    All other components within normal limits  CBC WITH DIFFERENTIAL/PLATELET - Abnormal; Notable for the following components:   WBC 11.1 (*)    Neutro Abs 8.7 (*)    All other components within normal limits  PROTIME-INR - Abnormal; Notable for the following components:   Prothrombin Time 17.8 (*)    INR 1.5 (*)    All other components within normal limits  HEPARIN LEVEL (UNFRACTIONATED) - Abnormal; Notable for the following components:   Heparin Unfractionated 0.15 (*)    All other components within normal limits  CBC - Abnormal; Notable for the following components:   RBC 3.34 (*)    Hemoglobin 10.2 (*)    HCT 31.1 (*)    All other components within normal limits  BASIC METABOLIC PANEL - Abnormal; Notable for the following components:   Sodium 131 (*)    Calcium 8.1 (*)    All other components within normal limits  TROPONIN I (HIGH SENSITIVITY) - Abnormal; Notable for the following components:   Troponin I (High Sensitivity) 2,277 (*)    All other components within normal limits  SARS CORONAVIRUS 2 BY RT PCR (HOSPITAL ORDER, PERFORMED  [facility])  LIPID PANEL  HEPARIN LEVEL (UNFRACTIONATED)  TROPONIN I (HIGH SENSITIVITY)    EKG EKG Interpretation  Date/Time:  Thursday July 01 2019 09:22:19 EDT Ventricular Rate:  60 PR Interval:    QRS Duration: 144 QT Interval:  448 QTC Calculation: 448 R Axis:   -78 Text Interpretation: Ventricular-paced rhythm No further analysis attempted due to paced rhythm Confirmed by Fredia Sorrow [id number]) on 07/01/2019 9:42:11 AM   Radiology DG Chest Portable 1 View  Result Date: 07/01/2019 CLINICAL DATA:  Chest pain. EXAM: PORTABLE CHEST 1 VIEW COMPARISON:  06/22/2019 FINDINGS: Chronic mild cardiomegaly. Pulmonary vascularity is normal. Pacemaker in place. No infiltrates or effusions. No acute bone abnormality. Chronic degenerative changes of both shoulders. IMPRESSION: No acute  cardiopulmonary disease. Chronic mild cardiomegaly.  The Electronically Signed   By: Lorriane Shire M.D.   On: 07/01/2019 10:01   CT Angio Chest/Abd/Pel for Dissection W and/or W/WO  Result Date: 07/01/2019 CLINICAL DATA:  Chest pain.  Chronic low back pain EXAM: CT ANGIOGRAPHY CHEST, ABDOMEN AND PELVIS TECHNIQUE: Non-contrast CT of the chest was initially obtained. Multidetector CT imaging through the chest, abdomen and pelvis was performed using the standard protocol during bolus administration of intravenous contrast. Multiplanar reconstructed images and MIPs were obtained and reviewed to evaluate the vascular anatomy. CONTRAST:  153mL OMNIPAQUE IOHEXOL 350 MG/ML SOLN COMPARISON:  12/24/2017 FINDINGS: CTA CHEST FINDINGS Cardiovascular: Preferential opacification of the thoracic aorta. No evidence of thoracic aortic aneurysm or dissection. Three vessel arch. Stable cardiomegaly. No pericardial effusion. Scattered coronary artery calcifications. Right-sided implanted cardiac device with single lead terminating in the right ventricle. Mediastinum/Nodes: Redemonstration of multiple mildly enlarged mediastinal lymph nodes including 13 mm pretracheal node (series 7, image 37), previously measured 12 mm. 9 mm precarinal node (series 7, image 44), previously measured 6 mm. Borderline enlarged right hilar lymph node. No axillary or left hilar lymphadenopathy. Unremarkable thyroid, trachea, and esophagus. Lungs/Pleura: Trace bilateral pleural effusions with associated compressive atelectasis. Additional bandlike areas of chronic scarring or atelectasis within the bilateral lung bases. No pneumothorax. Musculoskeletal: Chronic mild superior endplate compression deformity of T12 is unchanged. No new or acute osseous findings within the thorax. High-riding left humeral head. Chronic fracture/fragmentation of the posterior left glenoid (series 6, image 13). Review of the MIP images confirms the above findings. CTA ABDOMEN  AND PELVIS FINDINGS VASCULAR Aorta: Normal caliber aorta without aneurysm, dissection, vasculitis or significant stenosis. Celiac: Patent without evidence of aneurysm, dissection, vasculitis or significant stenosis. Separate origin of the splenic and common hepatic arteries. SMA: Patent without evidence of aneurysm, dissection, vasculitis or significant stenosis. Renals: Both renal arteries are patent without evidence of aneurysm, dissection, vasculitis, fibromuscular dysplasia or significant stenosis. Single renal artery bilaterally. IMA: Patent. Inflow: Patent without evidence of aneurysm, dissection, vasculitis or significant stenosis. Veins: No obvious venous abnormality within the limitations of this arterial phase study. Review of the MIP images confirms the above findings. NON-VASCULAR Hepatobiliary: Stable 1.0 cm low-density lesion within the anterior aspect of the right hepatic lobe. Liver is otherwise unremarkable. Gallbladder is mildly distended. No hyperdense gallstone or evidence of pericholecystic inflammation. Pancreas: Mildly atrophic. No pancreatic ductal dilatation or surrounding inflammatory changes. Spleen: Normal in size without focal abnormality. Adrenals/Urinary Tract: Unremarkable adrenal glands. Transverse lie of the right kidney with stable slightly hyperdense 1.8 cm right renal cyst (series 7, image 138). No renal stone or hydronephrosis. Urinary bladder is unremarkable. Stomach/Bowel: Stomach is within normal limits. Appendix not definitively visualized. Extensive colonic  diverticulosis. No evidence of bowel wall thickening, distention, or inflammatory changes. Lymphatic: No abdominopelvic lymphadenopathy. Reproductive: Status post hysterectomy. No adnexal masses. Other: No free air.  No free fluid.  No abdominal wall hernia. Musculoskeletal: Severe osteoarthritis of the bilateral hips, which has progressed on the right. No acute osseous abnormality. Multilevel lumbar spondylosis with  evidence of high-grade canal stenosis at L3-4 and L4-5. Review of the MIP images confirms the above findings. IMPRESSION: 1. No evidence of thoracic or abdominal aortic aneurysm or dissection. 2. Trace bilateral pleural effusions with associated compressive atelectasis. 3. Mildly enlarged mediastinal lymph nodes, which have slightly progressed in size from prior CT 12/24/2017. Findings are nonspecific and may be reactive. 4. Extensive colonic diverticulosis without evidence of acute diverticulitis. 5. Severe osteoarthritis of the bilateral hips, which has progressed on the right. 6. Multilevel lumbar spondylosis with evidence of high-grade canal stenosis at L3-4 and L4-5. Electronically Signed   By: Davina Poke D.O.   On: 07/01/2019 12:24    Procedures Procedures (including critical care time)  CRITICAL CARE Performed by: Fredia Sorrow Total critical care time: 45 minutes Critical care time was exclusive of separately billable procedures and treating other patients. Critical care was necessary to treat or prevent imminent or life-threatening deterioration. Critical care was time spent personally by me on the following activities: development of treatment plan with patient and/or surrogate as well as nursing, discussions with consultants, evaluation of patient's response to treatment, examination of patient, obtaining history from patient or surrogate, ordering and performing treatments and interventions, ordering and review of laboratory studies, ordering and review of radiographic studies, pulse oximetry and re-evaluation of patient's condition.   Medications Ordered in ED Medications  heparin ADULT infusion 100 units/mL (25000 units/218mL sodium chloride 0.45%) (900 Units/hr Intravenous Handoff 07/02/19 0720)  morphine 2 MG/ML injection 2 mg (2 mg Intravenous Given 07/02/19 0549)  sodium chloride flush (NS) 0.9 % injection 3 mL (3 mLs Intravenous Given 07/01/19 2212)  sodium chloride flush  (NS) 0.9 % injection 3 mL (has no administration in time range)  0.9 %  sodium chloride infusion (has no administration in time range)  0.9% sodium chloride infusion (3 mL/kg/hr  59.9 kg Intravenous New Bag/Given 07/02/19 0421)    Followed by  0.9% sodium chloride infusion (1 mL/kg/hr  59.9 kg Intravenous New Bag/Given 07/02/19 0517)  atenolol (TENORMIN) tablet 12.5 mg (has no administration in time range)  traZODone (DESYREL) tablet 150 mg (150 mg Oral Given 07/01/19 2211)  levothyroxine (SYNTHROID) tablet 100 mcg (100 mcg Oral Given 07/02/19 0553)  docusate sodium (COLACE) capsule 400 mg (400 mg Oral Given 07/01/19 1721)  pantoprazole (PROTONIX) EC tablet 40 mg (40 mg Oral Given 07/01/19 1720)  methocarbamol (ROBAXIN) tablet 500 mg (500 mg Oral Given 07/01/19 2211)  loratadine (CLARITIN) tablet 10 mg (10 mg Oral Given 07/01/19 1721)  fluticasone furoate-vilanterol (BREO ELLIPTA) 200-25 MCG/INH 1 puff (has no administration in time range)  cycloSPORINE (RESTASIS) 0.05 % ophthalmic emulsion 1 drop (1 drop Both Eyes Given 07/01/19 2212)  aspirin EC tablet 81 mg (has no administration in time range)  nitroGLYCERIN (NITROSTAT) SL tablet 0.4 mg (has no administration in time range)  acetaminophen (TYLENOL) tablet 650 mg (650 mg Oral Given 07/02/19 0427)  ondansetron (ZOFRAN) injection 4 mg (4 mg Intravenous Given 07/01/19 1843)  atorvastatin (LIPITOR) tablet 40 mg (40 mg Oral Given 07/01/19 1846)  magnesium oxide (MAG-OX) tablet 400 mg (400 mg Oral Given 07/01/19 1847)  albuterol (PROVENTIL) (2.5 MG/3ML) 0.083% nebulizer solution  2.5 mg (has no administration in time range)  predniSONE (DELTASONE) tablet 5 mg (has no administration in time range)  nitroGLYCERIN 50 mg in dextrose 5 % 250 mL (0.2 mg/mL) infusion (5 mcg/min Intravenous Rate/Dose Change 07/02/19 0545)  ondansetron (ZOFRAN) injection 4 mg (4 mg Intravenous Given 07/01/19 1112)  HYDROmorphone (DILAUDID) injection 1 mg (1 mg Intravenous Given  07/01/19 1113)  iohexol (OMNIPAQUE) 350 MG/ML injection 100 mL (100 mLs Intravenous Contrast Given 07/01/19 1130)  aspirin chewable tablet 324 mg (324 mg Oral Given 07/01/19 1352)  heparin injection 3,600 Units (3,600 Units Intravenous Given 07/01/19 1434)  aspirin chewable tablet 81 mg (81 mg Oral Given 07/01/19 1902)  heparin bolus via infusion 1,500 Units (1,500 Units Intravenous Bolus from Bag 07/02/19 0245)  angioplasty book ( Does not apply Given 07/02/19 0518)  heart attack bouncing book ( Does not apply Given 07/02/19 0518)    ED Course  I have reviewed the triage vital signs and the nursing notes.  Pertinent labs & imaging results that were available during my care of the patient were reviewed by me and considered in my medical decision making (see chart for details).    MDM Rules/Calculators/A&P                         Patient's initial troponin was normal.  Noted that patient is on Coumadin so INR ordered.  Visual EKG without acute changes.  Chest x-ray without acute changes.  Due to the fact that the pain radiates to the upper back CT angio ordered to rule out dissection.  Patient does appear uncomfortable.  But is used to being on pain medicine and recently stopped.  So patient ordered some hydromorphone and Zofran.  With significant improvement in the pain.  Patient still with some anterior chest pain.  Patient second troponin was markedly elevated that 2277.  Repeat EKG done no change in the EKG.  Patient given aspirin.  Discussed with cardiology patient's diagnosis now non-STEMI.  Cardiology recommends transfer to [facility].  Is Dr. Havery Moros that we spoke with.  Patient's INR is back and that is 1.5.  So patient started on heparin.  They want patient to be admitted to cardiac telemetry.  Not go immediately to Cath Lab.  In addition patient given aspirin.     Final Clinical Impression(s) / ED Diagnoses Final diagnoses:  NSTEMI (non-ST elevated myocardial infarction) [facility])    Rx / DC  Orders ED Discharge Orders    None       Fredia Sorrow, MD 07/02/19 [phone]

## 2019-07-01 NOTE — H&P (Signed)
Physician History and Physical     Patient ID: Angela Wong MRN: [id number] DOB/AGE: 80-Oct-1942 80 y.o. Admit date: 07/01/2019  Primary Care Physician: [name], FNP Primary Cardiologist: Hochrein/ Lottie Dawson   Active Problems:   Non-STEMI (non-ST elevated myocardial infarction) [facility])   HPI:  79 y.o. with history of chronic afib, SSS post PPM placement in [place] 2016.  She has a history of dilated cardiomyopathy  Presumed non ischemic. Admitted to [facility] by hospital ist 06/22/19-06/24/19 for CHF. She has history of COPD, HTN, Asthma, Raynaud's and RA She is a retired Marine scientist She indicates being very sensitive to medication and specifically talks about Dr Percival Spanish starting her on too high a dose of coreg. Admitted with more dyspnea and LE edema She has chronic pain syndrome Her home Bumex dose was adjusted CXR showed CE with no acute CHF BNP only 296.  Echo done 06/23/19 with no RWMA EF 50% global hypokinesis mild MR and AV sclerosis  She was Rx with steroids for generalized pain and d/c to rehab. She started having SSCP and back pain last night worse today In ER ECG afib with pacing and unrevealing Troponin elevated 2277.  Her INR was 1.5 and she is being started on heparin. She normally takes 4 mg /alt with 2mg  and had been getting 3 mg daily at rehab. Discussed transfer to [facility] with her for heart catheterization in am given pan and positive troponin with recent admission for CHF She is agreeable   Review of systems complete and found to be negative unless listed above   Past Medical History:  Diagnosis Date  . A-fib ([place])   . Asthma   . Cerebral vasculitis   . Congestive dilated cardiomyopathy ([place])   . COPD (chronic obstructive pulmonary disease) ([place])   . Fibromyalgia   . Gastric polyp   . GERD (gastroesophageal reflux disease)   . Hiatal hernia   . Hyperparathyroidism ([place])   . IBS (irritable bowel syndrome)   . MVP (mitral valve prolapse)   . Osteoarthritis     . Ovarian cancer ([place])    lymph node removal with hysterectomy  . Raynaud's disease   . RLS (restless legs syndrome)   . Situational depression   . Sjogren's syndrome ([place])   . Vasculitis ([place])   . Vitamin D deficiency     Family History  Problem Relation Age of Onset  . COPD Mother   . Breast cancer Mother   . Allergic rhinitis Mother   . Heart disease Father        No details  . Kidney disease Father   . Allergic rhinitis Father   . Stroke Sister   . Arthritis/Rheumatoid Sister   . Asthma Sister   . Lupus Sister   . Heart attack Sister   . Stroke Paternal Grandmother   . Scleroderma Grandchild   . Thyroid disease Other   . Breast cancer Maternal Aunt        x 2    Social History   Socioeconomic History  . Marital status: Single    Spouse name: Not on file  . Number of children: Not on file  . Years of education: Not on file  . Highest education level: Not on file  Occupational History  . Not on file  Tobacco Use  . Smoking status: Never Smoker  . Smokeless tobacco: Never Used  Vaping Use  . Vaping Use: Never used  Substance and Sexual Activity  . Alcohol use:  No  . Drug use: No  . Sexual activity: Not on file  Other Topics Concern  . Not on file  Social History Narrative   Lives alone.  Moved from [place].     Caffeine- 6 cups daily, mix of caffeine/decaf   Children- 3   Retired Therapist, sports   Social Determinants of Radio broadcast assistant Strain: High Risk  . Difficulty of Paying Living Expenses: Hard  Food Insecurity: No Food Insecurity  . Worried About Charity fundraiser in the Last Year: Never true  . Ran Out of Food in the Last Year: Never true  Transportation Needs: Unmet Transportation Needs  . Lack of Transportation (Medical): Yes  . Lack of Transportation (Non-Medical): No  Physical Activity:   . Days of Exercise per Week:   . Minutes of Exercise per Session:   Stress:   . Feeling of Stress :   Social Connections: Socially Isolated  .  Frequency of Communication with Friends and Family: Once a week  . Frequency of Social Gatherings with Friends and Family: Once a week  . Attends Religious Services: Never  . Active Member of Clubs or Organizations: No  . Attends Archivist Meetings: Never  . Marital Status: Widowed  Intimate Partner Violence:   . Fear of Current or Ex-Partner:   . Emotionally Abused:   Marland Kitchen Physically Abused:   . Sexually Abused:     Past Surgical History:  Procedure Laterality Date  . ABDOMINAL HYSTERECTOMY  1982   with right oophorectomy  . APPENDECTOMY    . BREAST BIOPSY Right    x 2  . BREAST SURGERY     Biopsy  . CARPAL TUNNEL RELEASE Right 1980  . CARPAL TUNNEL RELEASE Left 2010   x 2  . CATARACT EXTRACTION Bilateral   . CESAREAN SECTION     x 3  . KNEE SURGERY Left 2005  . OOPHORECTOMY Left 1962  . PACEMAKER INSERTION  09/15/2014  . REFRACTIVE SURGERY Bilateral 2014  . TUBAL LIGATION       (Not in a hospital admission)   Physical Exam: Blood pressure (!) 150/80, pulse 84, temperature 98.1 F (36.7 C), temperature source Oral, resp. rate 15, height 5\' 4"  (1.626 m), weight 59.9 kg, SpO2 91 %.  Affect appropriate Frail elderly female  HEENT: normal Neck supple with no adenopathy JVP normal no bruits no thyromegaly Lungs clear with no wheezing and good diaphragmatic motion Heart:  S1/S2 SEM  murmur, no rub, gallop or click PMI normal PPM under right clavicle  Abdomen: benighn, BS positve, no tenderness, no AAA no bruit.  No HSM or HJR Distal pulses intact with no bruits Trace edema Neuro non-focal Skin warm and dry No muscular weakness  No current facility-administered medications on file prior to encounter.   Current Outpatient Medications on File Prior to Encounter  Medication Sig Dispense Refill  . albuterol (VENTOLIN HFA) 108 (90 Base) MCG/ACT inhaler INHALE 2 PUFFS BY MOUTH EVERY 6 HOURS AS NEEDED FOR WHEEZING (Patient taking differently: Inhale 2 puffs into  the lungs every 6 (six) hours as needed for wheezing or shortness of breath. INHALE 2 PUFFS BY MOUTH EVERY 6 HOURS AS NEEDED FOR WHEEZING) 9 g 0  . atenolol (TENORMIN) 25 MG tablet Take 12.5 mg by mouth daily. *May take additional 12.5mg  daily as needed for A-Fib  6  . B Complex-C (B-COMPLEX WITH VITAMIN C) tablet Take 1 tablet by mouth every evening.     Marland Kitchen  bumetanide (BUMEX) 0.5 MG tablet Take 0.5 mg by mouth every other day.    . bumetanide (BUMEX) 1 MG tablet Take 1 mg by mouth every other day.    . Cholecalciferol (VITAMIN D) 2000 UNITS CAPS Take 2 capsules by mouth every evening.     . diphenhydrAMINE (BENADRYL) 25 MG tablet Take 25 mg by mouth daily as needed for itching or allergies.     Marland Kitchen docusate sodium (COLACE) 100 MG capsule Take 400 mg by mouth every morning.     Marland Kitchen esomeprazole (NEXIUM) 40 MG capsule Take 1 capsule (40 mg total) by mouth 2 (two) times daily. 180 capsule 2  . fexofenadine (ALLEGRA) 180 MG tablet Take 180 mg by mouth every morning.     . Iron Polysacch Cmplx-B12-FA (POLY-IRON 150 FORTE) 150-0.025-1 MG CAPS Take 1 capsule by mouth daily. 90 capsule 1  . levothyroxine (EUTHYROX) 50 MCG tablet Take 2 tablets (100 mcg total) by mouth every morning. 180 tablet 3  . Lidocaine (HM LIDOCAINE PATCH) 4 % PTCH Apply 1 application topically daily.    Marland Kitchen losartan (COZAAR) 100 MG tablet Take 1 tablet (100 mg total) by mouth daily. 90 tablet 1  . Magnesium 400 MG TABS Take 400 mg by mouth every evening.     . melatonin 3 MG TABS tablet Take 3 mg by mouth at bedtime.    . methocarbamol (ROBAXIN) 500 MG tablet Take 1 tablet (500 mg total) by mouth 3 (three) times daily. 10 tablet 0  . Olopatadine HCl 0.2 % SOLN Apply 1 drop to eye daily as needed (for allergy eye). 2.5 mL 5  . oxyCODONE-acetaminophen (PERCOCET/ROXICET) 5-325 MG tablet Take 1 tablet by mouth every 6 (six) hours as needed for up to 10 days for moderate pain or severe pain. 10 tablet 0  . polyethylene glycol (MIRALAX /  GLYCOLAX) 17 g packet Take 17 g by mouth daily as needed for mild constipation or moderate constipation (For Constipation).     . predniSONE (DELTASONE) 5 MG tablet Take 5 mg by mouth every morning.    . RESTASIS 0.05 % ophthalmic emulsion INSTILL 1 DROP INTO EACH EYE TWICE DAILY (Patient taking differently: Place 1 drop into both eyes 2 (two) times daily. ) 120 each 0  . Simethicone (GAS-X EXTRA STRENGTH) 125 MG CAPS Take 2 capsules by mouth daily as needed (for gas).     . SYMBICORT 160-4.5 MCG/ACT inhaler Inhale 2 puffs by mouth twice daily (Patient taking differently: Inhale 2 puffs into the lungs in the morning and at bedtime. ) 10.2 g 5  . traZODone (DESYREL) 150 MG tablet Take 1 tablet (150 mg total) by mouth at bedtime. 90 tablet 2  . vitamin B-12 (CYANOCOBALAMIN) 500 MCG tablet Take 1 tablet (500 mcg total) by mouth daily. 90 tablet 2  . warfarin (COUMADIN) 3 MG tablet Take 3 mg by mouth at bedtime.    Roseanne Kaufman Peru-Castor Oil (VENELEX) OINT Apply topically in the morning, at noon, and at bedtime. Apply to left buttock qshift. (Patient not taking: Reported on 07/01/2019)    . NON FORMULARY Diet: _____ Regular, __x____ NAS, _______Consistent Carbohydrate, _______NPO _____Other (Patient not taking: Reported on 07/01/2019)      Labs:   Lab Results  Component Value Date   WBC 11.1 (H) 07/01/2019   HGB 12.0 07/01/2019   HCT 36.9 07/01/2019   MCV 94.9 07/01/2019   PLT 191 07/01/2019    Recent Labs  Lab 07/01/19 0933  NA 135  K 3.8  CL 94*  CO2 30  BUN 19  CREATININE 0.78  CALCIUM 8.8*  GLUCOSE 116*   Lab Results  Component Value Date   TROPONINI <0.03 03/19/2018   TROPONINI <0.03 12/24/2017   TROPONINI <0.03 12/24/2017     Lab Results  Component Value Date   CHOL 152 03/03/2019   CHOL 205 (H) 12/18/2017   CHOL 212 (H) 04/18/2016   Lab Results  Component Value Date   HDL 69 03/03/2019   HDL 84 12/18/2017   HDL 81 04/18/2016   Lab Results  Component Value Date    LDLCALC 68 03/03/2019   LDLCALC 102 (H) 12/18/2017   LDLCALC 108 (H) 04/18/2016   Lab Results  Component Value Date   TRIG 79 03/03/2019   TRIG 96 12/18/2017   TRIG 113 04/18/2016   Lab Results  Component Value Date   CHOLHDL 2.2 03/03/2019   CHOLHDL 2.4 12/18/2017   CHOLHDL 2.6 04/18/2016   No results found for: LDLDIRECT     Radiology: DG Chest Portable 1 View  Result Date: 07/01/2019 CLINICAL DATA:  Chest pain. EXAM: PORTABLE CHEST 1 VIEW COMPARISON:  06/22/2019 FINDINGS: Chronic mild cardiomegaly. Pulmonary vascularity is normal. Pacemaker in place. No infiltrates or effusions. No acute bone abnormality. Chronic degenerative changes of both shoulders. IMPRESSION: No acute cardiopulmonary disease. Chronic mild cardiomegaly.  The Electronically Signed   By: Lorriane Shire M.D.   On: 07/01/2019 10:01   DG Chest Portable 1 View  Result Date: 06/22/2019 CLINICAL DATA:  Shortness of breath and lower extremity swelling for the past few weeks. EXAM: PORTABLE CHEST 1 VIEW COMPARISON:  PA and lateral chest 03/19/2018 and CT chest 12/24/2017. FINDINGS: Pacing device is unchanged. Lungs are clear. Heart size is enlarged. No pneumothorax or pleural effusion. No acute bony abnormality. IMPRESSION: Cardiomegaly without acute disease. Electronically Signed   By: Inge Rise M.D.   On: 06/22/2019 14:57   ECHOCARDIOGRAM COMPLETE  Result Date: 06/23/2019    ECHOCARDIOGRAM REPORT   Patient Name:   Angela Wong Date of Exam: 06/23/2019 Medical Rec #:  [id number]       Height:       64.0 in Accession #:    [id number]      Weight:       130.9 lb Date of Birth:  1940/03/29      BSA:          1.634 m Patient Age:    40 years        BP:           115/69 mmHg Patient Gender: F               HR:           63 bpm. Exam Location:  [facility] Procedure: 2D Echo Indications:    Congestive Heart Failure 428.0 / I50.9  History:        Patient has prior history of Echocardiogram examinations, most                  recent 08/26/2017. Arrythmias:Atrial Fibrillation,                 Signs/Symptoms:Dyspnea; Risk Factors:Hypertension and                 Dyslipidemia. MVP (mitral valve prolapse, GERD, History of                 ovarian cancer, RA (rheumatoid arthritis.  Sonographer:  Leavy Cella RDCS (AE) Referring Phys: [place]  1. Left ventricular ejection fraction, by estimation, is 50%. The left ventricle has low normal function. The left ventricle demonstrates global hypokinesis. There is mild left ventricular hypertrophy. Left ventricular diastolic parameters are indeterminate.  2. Right ventricular systolic function is normal. The right ventricular size is normal. There is normal pulmonary artery systolic pressure. The estimated right ventricular systolic pressure is 75.9 mmHg.  3. Left atrial size was moderately dilated.  4. The mitral valve is grossly normal with mild annular calcification. Mild mitral valve regurgitation.  5. The aortic valve is tricuspid. Aortic valve regurgitation is not visualized. Mild aortic valve sclerosis is present, with no evidence of aortic valve stenosis.  6. The inferior vena cava is normal in size with greater than 50% respiratory variability, suggesting right atrial pressure of 3 mmHg. FINDINGS  Left Ventricle: Left ventricular ejection fraction, by estimation, is 50%. The left ventricle has low normal function. The left ventricle demonstrates global hypokinesis. The left ventricular internal cavity size was normal in size. There is mild left ventricular hypertrophy. Left ventricular diastolic parameters are indeterminate. Right Ventricle: The right ventricular size is normal. No increase in right ventricular wall thickness. Right ventricular systolic function is normal. There is normal pulmonary artery systolic pressure. The tricuspid regurgitant velocity is 1.83 m/s, and  with an assumed right atrial pressure of 3 mmHg, the estimated right ventricular  systolic pressure is 16.3 mmHg. Left Atrium: Left atrial size was moderately dilated. Right Atrium: Right atrial size was normal in size. Pericardium: There is no evidence of pericardial effusion. Presence of pericardial fat pad. Mitral Valve: The mitral valve is grossly normal. Mild mitral annular calcification. Mild mitral valve regurgitation. Tricuspid Valve: The tricuspid valve is grossly normal. Tricuspid valve regurgitation is trivial. Aortic Valve: The aortic valve is tricuspid. Aortic valve regurgitation is not visualized. Mild aortic valve sclerosis is present, with no evidence of aortic valve stenosis. Mild aortic valve annular calcification. Pulmonic Valve: The pulmonic valve was grossly normal. Pulmonic valve regurgitation is trivial. Aorta: The aortic root is normal in size and structure. Venous: The inferior vena cava is normal in size with greater than 50% respiratory variability, suggesting right atrial pressure of 3 mmHg. IAS/Shunts: No atrial level shunt detected by color flow Doppler.  LEFT VENTRICLE PLAX 2D LVIDd:         5.06 cm  Diastology LVIDs:         3.76 cm  LV e' lateral:   9.32 cm/s LV PW:         1.22 cm  LV E/e' lateral: 10.4 LV IVS:        0.94 cm  LV e' medial:    6.49 cm/s LVOT diam:     2.00 cm  LV E/e' medial:  14.9 LVOT Area:     3.14 cm  RIGHT VENTRICLE RV S prime:     10.40 cm/s TAPSE (M-mode): 1.6 cm LEFT ATRIUM             Index       RIGHT ATRIUM           Index LA diam:        4.60 cm 2.82 cm/m  RA Area:     20.70 cm LA Vol (A2C):   68.3 ml 41.80 ml/m RA Volume:   59.90 ml  36.66 ml/m LA Vol (A4C):   75.3 ml 46.09 ml/m LA Biplane Vol: 71.6 ml 43.82 ml/m  AORTA Ao Root diam: 2.70 cm MITRAL VALVE               TRICUSPID VALVE MV Area (PHT): 3.48 cm    TR Peak grad:   13.4 mmHg MV Decel Time: 218 msec    TR Vmax:        183.00 cm/s MR Peak grad: 28.9 mmHg MR Vmax:      269.00 cm/s  SHUNTS MV E velocity: 97.00 cm/s  Systemic Diam: 2.00 cm MV A velocity: 23.80 cm/s MV  E/A ratio:  4.08 Rozann Lesches MD Electronically signed by Rozann Lesches MD Signature Date/Time: 06/23/2019/12:11:59 PM    Final    CT Angio Chest/Abd/Pel for Dissection W and/or W/WO  Result Date: 07/01/2019 CLINICAL DATA:  Chest pain.  Chronic low back pain EXAM: CT ANGIOGRAPHY CHEST, ABDOMEN AND PELVIS TECHNIQUE: Non-contrast CT of the chest was initially obtained. Multidetector CT imaging through the chest, abdomen and pelvis was performed using the standard protocol during bolus administration of intravenous contrast. Multiplanar reconstructed images and MIPs were obtained and reviewed to evaluate the vascular anatomy. CONTRAST:  19mL OMNIPAQUE IOHEXOL 350 MG/ML SOLN COMPARISON:  12/24/2017 FINDINGS: CTA CHEST FINDINGS Cardiovascular: Preferential opacification of the thoracic aorta. No evidence of thoracic aortic aneurysm or dissection. Three vessel arch. Stable cardiomegaly. No pericardial effusion. Scattered coronary artery calcifications. Right-sided implanted cardiac device with single lead terminating in the right ventricle. Mediastinum/Nodes: Redemonstration of multiple mildly enlarged mediastinal lymph nodes including 13 mm pretracheal node (series 7, image 37), previously measured 12 mm. 9 mm precarinal node (series 7, image 44), previously measured 6 mm. Borderline enlarged right hilar lymph node. No axillary or left hilar lymphadenopathy. Unremarkable thyroid, trachea, and esophagus. Lungs/Pleura: Trace bilateral pleural effusions with associated compressive atelectasis. Additional bandlike areas of chronic scarring or atelectasis within the bilateral lung bases. No pneumothorax. Musculoskeletal: Chronic mild superior endplate compression deformity of T12 is unchanged. No new or acute osseous findings within the thorax. High-riding left humeral head. Chronic fracture/fragmentation of the posterior left glenoid (series 6, image 13). Review of the MIP images confirms the above findings. CTA  ABDOMEN AND PELVIS FINDINGS VASCULAR Aorta: Normal caliber aorta without aneurysm, dissection, vasculitis or significant stenosis. Celiac: Patent without evidence of aneurysm, dissection, vasculitis or significant stenosis. Separate origin of the splenic and common hepatic arteries. SMA: Patent without evidence of aneurysm, dissection, vasculitis or significant stenosis. Renals: Both renal arteries are patent without evidence of aneurysm, dissection, vasculitis, fibromuscular dysplasia or significant stenosis. Single renal artery bilaterally. IMA: Patent. Inflow: Patent without evidence of aneurysm, dissection, vasculitis or significant stenosis. Veins: No obvious venous abnormality within the limitations of this arterial phase study. Review of the MIP images confirms the above findings. NON-VASCULAR Hepatobiliary: Stable 1.0 cm low-density lesion within the anterior aspect of the right hepatic lobe. Liver is otherwise unremarkable. Gallbladder is mildly distended. No hyperdense gallstone or evidence of pericholecystic inflammation. Pancreas: Mildly atrophic. No pancreatic ductal dilatation or surrounding inflammatory changes. Spleen: Normal in size without focal abnormality. Adrenals/Urinary Tract: Unremarkable adrenal glands. Transverse lie of the right kidney with stable slightly hyperdense 1.8 cm right renal cyst (series 7, image 138). No renal stone or hydronephrosis. Urinary bladder is unremarkable. Stomach/Bowel: Stomach is within normal limits. Appendix not definitively visualized. Extensive colonic diverticulosis. No evidence of bowel wall thickening, distention, or inflammatory changes. Lymphatic: No abdominopelvic lymphadenopathy. Reproductive: Status post hysterectomy. No adnexal masses. Other: No free air.  No free fluid.  No abdominal wall hernia. Musculoskeletal: Severe osteoarthritis of  the bilateral hips, which has progressed on the right. No acute osseous abnormality. Multilevel lumbar spondylosis  with evidence of high-grade canal stenosis at L3-4 and L4-5. Review of the MIP images confirms the above findings. IMPRESSION: 1. No evidence of thoracic or abdominal aortic aneurysm or dissection. 2. Trace bilateral pleural effusions with associated compressive atelectasis. 3. Mildly enlarged mediastinal lymph nodes, which have slightly progressed in size from prior CT 12/24/2017. Findings are nonspecific and may be reactive. 4. Extensive colonic diverticulosis without evidence of acute diverticulitis. 5. Severe osteoarthritis of the bilateral hips, which has progressed on the right. 6. Multilevel lumbar spondylosis with evidence of high-grade canal stenosis at L3-4 and L4-5. Electronically Signed   By: Davina Poke D.O.   On: 07/01/2019 12:24    EKG: afib v pacing   ASSESSMENT AND PLAN:   1. SEMI:  No documented history of CAD SSCP with troponin 2277  ECG unrevealing due to pacing. History of DCM and recent admission for CHF with EF 50% by eco INR subRx start heparin use atenolol for beta blocker Transfer to [facility] for cath in am with Dr Irish Lack  2. RA/Raynaud's:  Continue prednisone and robaxin can use MSO4 for pain next 24 hours  3. CHF:  ? Ischemic vs diastolic hold Bumex for heart cath in am No signs of CHF currently  4. Afib: chronic rate control ok continue atenolol Heparin resume coumadin post cath Normal dose coumadin 4 mg/alt 2mg  daily  5. PPM:  Unknown type placed 2016 in [place] she has followed with [facility] most recently   I have notified PA and cath lab as well as [facility] regarding patient status transfer and bed request for telemetry   Signed: Collier Salina Nishan6/17/2021, 2:48 PM

## 2019-07-01 NOTE — ED Triage Notes (Signed)
PT brought in by [facility] from [place] [facility] today. PT states middle chest pain, bilateral tingling in arms and nausea that started this am. PT also c/o her chronic lower back pain and leg pain. PT states they stopped her oxycodone medication and now giving muscle relaxer x2 days ago.

## 2019-07-01 NOTE — Progress Notes (Signed)
[place] for Heparin Indication: chest pain/ACS  Allergies  Allergen Reactions  . Cephalosporins Hives and Shortness Of Breath  . Ciprofloxacin Hives and Shortness Of Breath  . Diltiazem Shortness Of Breath    Swollen throat   . Doxycycline Hives and Shortness Of Breath  . Horse-Derived Products Anaphylaxis  . Ketek [Telithromycin] Palpitations    Chest discomfort  . Nitrofuran Derivatives Anaphylaxis and Hives    blisters  . Nitrous Oxide Nausea And Vomiting    Severe due to Sjogrens (Auto-Immune Disease)  . Other Anaphylaxis    ALLERGY TO HORSE SERUM  . Penicillins Hives and Shortness Of Breath       . Pentazocine Other (See Comments)    Other reaction(s): Mental Status Changes (intolerance) Altered Mental Status  Altered Mental Status  Altered Mental Status   . Sulfa Antibiotics Hives and Shortness Of Breath  . Trovan [Alatrofloxacin] Palpitations, Other (See Comments) and Anaphylaxis    Chest pain, dizziness, irregular pulse  . Zinc Gelatin [Zinc] Anaphylaxis  . Amlodipine Swelling  . Calcium Channel Blockers     Respiratory distress  . Carvedilol Other (See Comments)    Dizziness, "joint pain, depression"  . Clindamycin/Lincomycin     CP, lock jaw  . Codeine Nausea Only  . Cymbalta [Duloxetine Hcl] Swelling  . Diovan [Valsartan] Swelling  . Lisinopril Swelling  . Sertraline Other (See Comments)    confusion  . Tramadol Nausea Only  . Loteprednol Etabonate Rash    Patient Measurements: Height: 5\' 4"  (162.6 cm) Weight: 62 kg (136 lb 9.6 oz) IBW/kg (Calculated) : 54.7 HEPARIN DW (KG): 62  Vital Signs: Temp: 100 F (37.8 C) (06/17 2137) Temp Source: Oral (06/17 2137) BP: 119/70 (06/17 2137) Pulse Rate: 80 (06/17 2137)  Labs: Recent Labs    07/01/19 0500 07/01/19 0933 07/01/19 1153 07/01/19 1330 07/01/19 2202  HGB  --  12.0  --   --   --   HCT  --  36.9  --   --   --   PLT  --  191  --   --   --   LABPROT  18.3*  --   --  17.8*  --   INR 1.6*  --   --  1.5*  --   HEPARINUNFRC  --   --   --   --  0.15*  CREATININE  --  0.78  --   --   --   TROPONINIHS  --  9 2,277*  --   --     Estimated Creatinine Clearance: 49.2 mL/min (by C-G formula based on SCr of 0.78 mg/dL).   Assessment: 79 y.o. female with chest pain for heparin   Goal of Therapy:  Heparin level 0.3-0.7 units/ml Monitor platelets by anticoagulation protocol: Yes   Plan:  Heparin 1500 units IV bolus, then increase heparin 900 units/hr Follow-up am labs.   Phillis Knack, PharmD, BCPS    07/01/2019,11:00 PM

## 2019-07-01 NOTE — ED Notes (Signed)
CRITICAL VALUE ALERT  Critical Value:  Trop 2,277  Date & Time Notied:  07/01/2019, 1305  Provider Notified: Dr. Rogene Houston  Orders Received/Actions taken: no new orders received

## 2019-07-02 ENCOUNTER — Encounter (HOSPITAL_COMMUNITY)
Admission: EM | Disposition: A | Discharge status: Skilled Nursing Facility | Payer: Self-pay | Source: Skilled Nursing Facility | Attending: Internal Medicine

## 2019-07-02 DIAGNOSIS — I2510 Atherosclerotic heart disease of native coronary artery without angina pectoris: Secondary | ICD-10-CM

## 2019-07-02 DIAGNOSIS — I4821 Permanent atrial fibrillation: Secondary | ICD-10-CM

## 2019-07-02 DIAGNOSIS — I214 Non-ST elevation (NSTEMI) myocardial infarction: Principal | ICD-10-CM

## 2019-07-02 DIAGNOSIS — I25118 Atherosclerotic heart disease of native coronary artery with other forms of angina pectoris: Secondary | ICD-10-CM

## 2019-07-02 DIAGNOSIS — Z5181 Encounter for therapeutic drug level monitoring: Secondary | ICD-10-CM

## 2019-07-02 DIAGNOSIS — Z7901 Long term (current) use of anticoagulants: Secondary | ICD-10-CM

## 2019-07-02 HISTORY — PX: LEFT HEART CATH AND CORONARY ANGIOGRAPHY: CATH118249

## 2019-07-02 LAB — LIPID PANEL
Cholesterol: 119 mg/dL (ref 0–200)
HDL: 51 mg/dL (ref >40)
LDL Cholesterol: 60 mg/dL (ref 0–99)
Total CHOL/HDL Ratio: 2.3 RATIO
Triglycerides: 42 mg/dL (ref <150)
VLDL: 8 mg/dL (ref 0–40)

## 2019-07-02 LAB — CBC
HCT: 31.1 % — ABNORMAL LOW (ref 36.0–46.0)
Hemoglobin: 10.2 g/dL — ABNORMAL LOW (ref 12.0–15.0)
MCH: 30.5 pg (ref 26.0–34.0)
MCHC: 32.8 g/dL (ref 30.0–36.0)
MCV: 93.1 fL (ref 80.0–100.0)
Platelets: 168 10*3/uL (ref 150–400)
RBC: 3.34 MIL/uL — ABNORMAL LOW (ref 3.87–5.11)
RDW: 14.7 % (ref 11.5–15.5)
WBC: 9 10*3/uL (ref 4.0–10.5)
nRBC: 0 % (ref 0.0–0.2)

## 2019-07-02 LAB — BASIC METABOLIC PANEL
Anion gap: 8 (ref 5–15)
BUN: 18 mg/dL (ref 8–23)
CO2: 25 mmol/L (ref 22–32)
Calcium: 8.1 mg/dL — ABNORMAL LOW (ref 8.9–10.3)
Chloride: 98 mmol/L (ref 98–111)
Creatinine, Ser: 0.83 mg/dL (ref 0.44–1.00)
GFR calc Af Amer: >60 mL/min (ref >60)
GFR calc non Af Amer: >60 mL/min (ref >60)
Glucose, Bld: 79 mg/dL (ref 70–99)
Potassium: 3.7 mmol/L (ref 3.5–5.1)
Sodium: 131 mmol/L — ABNORMAL LOW (ref 135–145)

## 2019-07-02 SURGERY — LEFT HEART CATH AND CORONARY ANGIOGRAPHY
Anesthesia: LOCAL

## 2019-07-02 MED ORDER — SODIUM CHLORIDE 0.9 % IV SOLN: 250.0000 mL | INTRAVENOUS | Status: DC | PRN

## 2019-07-02 MED ORDER — WARFARIN - PHARMACIST DOSING INPATIENT: Freq: Every day | Status: DC

## 2019-07-02 MED ORDER — SODIUM CHLORIDE 0.9 % IV SOLN: INTRAVENOUS | Status: AC

## 2019-07-02 MED ORDER — MIDAZOLAM HCL 2 MG/2ML IJ SOLN
INTRAMUSCULAR | Status: DC | PRN
  Administered 2019-07-02: 2 mg via INTRAVENOUS

## 2019-07-02 MED ORDER — ASPIRIN 81 MG PO CHEW
81.0000 mg | CHEWABLE_TABLET | Freq: Once | ORAL | Status: AC
  Administered 2019-07-02: 81 mg via ORAL

## 2019-07-02 MED ORDER — WARFARIN SODIUM 5 MG PO TABS
6.0000 mg | ORAL_TABLET | Freq: Once | ORAL | Status: AC
  Administered 2019-07-02: 6 mg via ORAL
  Filled 2019-07-02: qty 1

## 2019-07-02 MED ORDER — LIDOCAINE HCL (PF) 1 % IJ SOLN
INTRAMUSCULAR | Status: DC | PRN
  Administered 2019-07-02: 2 mL

## 2019-07-02 MED ORDER — HEPARIN SODIUM (PORCINE) 1000 UNIT/ML IJ SOLN
INTRAMUSCULAR | Status: AC
  Filled 2019-07-02: qty 1

## 2019-07-02 MED ORDER — POTASSIUM CHLORIDE CRYS ER 20 MEQ PO TBCR
40.0000 meq | EXTENDED_RELEASE_TABLET | Freq: Once | ORAL | Status: AC
  Administered 2019-07-02: 40 meq via ORAL
  Filled 2019-07-02: qty 2

## 2019-07-02 MED ORDER — ONDANSETRON HCL 4 MG/2ML IJ SOLN
4.0000 mg | Freq: Four times a day (QID) | INTRAMUSCULAR | Status: DC | PRN
  Filled 2019-07-02: qty 2

## 2019-07-02 MED ORDER — FENTANYL CITRATE (PF) 100 MCG/2ML IJ SOLN
INTRAMUSCULAR | Status: DC | PRN
  Administered 2019-07-02: 25 ug via INTRAVENOUS

## 2019-07-02 MED ORDER — OXYCODONE-ACETAMINOPHEN 5-325 MG PO TABS
1.0000 | ORAL_TABLET | Freq: Four times a day (QID) | ORAL | Status: DC | PRN
  Administered 2019-07-02 – 2019-07-05 (×10): 1 via ORAL
  Filled 2019-07-02 (×10): qty 1

## 2019-07-02 MED ORDER — HEPARIN (PORCINE) IN NACL 1000-0.9 UT/500ML-% IV SOLN
INTRAVENOUS | Status: DC | PRN
  Administered 2019-07-02: 500 mL

## 2019-07-02 MED ORDER — VERAPAMIL HCL 2.5 MG/ML IV SOLN
INTRAVENOUS | Status: DC | PRN
  Administered 2019-07-02: 10 mL via INTRA_ARTERIAL

## 2019-07-02 MED ORDER — SODIUM CHLORIDE 0.9% FLUSH: 3.0000 mL | INTRAVENOUS | Status: DC | PRN

## 2019-07-02 MED ORDER — HEART ATTACK BOUNCING BOOK
Freq: Once | Status: AC
  Filled 2019-07-02: qty 1

## 2019-07-02 MED ORDER — HEPARIN (PORCINE) 25000 UT/250ML-% IV SOLN
1200.0000 [IU]/h | INTRAVENOUS | Status: DC
  Administered 2019-07-02: 900 [IU]/h via INTRAVENOUS
  Administered 2019-07-03: 1200 [IU]/h via INTRAVENOUS
  Administered 2019-07-03: 900 [IU]/h via INTRAVENOUS
  Administered 2019-07-03: 1050 [IU]/h via INTRAVENOUS
  Filled 2019-07-02 (×2): qty 250

## 2019-07-02 MED ORDER — WARFARIN SODIUM 4 MG PO TABS: 4.0000 mg | ORAL_TABLET | Freq: Once | ORAL | Status: DC

## 2019-07-02 MED ORDER — ACETAMINOPHEN 325 MG PO TABS: 650.0000 mg | ORAL_TABLET | ORAL | Status: DC | PRN

## 2019-07-02 MED ORDER — ANGIOPLASTY BOOK
Freq: Once | Status: AC
  Filled 2019-07-02: qty 1

## 2019-07-02 MED ORDER — HEPARIN (PORCINE) IN NACL 1000-0.9 UT/500ML-% IV SOLN
INTRAVENOUS | Status: AC
  Filled 2019-07-02: qty 1000

## 2019-07-02 MED ORDER — VERAPAMIL HCL 2.5 MG/ML IV SOLN
INTRAVENOUS | Status: AC
  Filled 2019-07-02: qty 2

## 2019-07-02 MED ORDER — HYDRALAZINE HCL 20 MG/ML IJ SOLN: 10.0000 mg | INTRAMUSCULAR | Status: AC | PRN

## 2019-07-02 MED ORDER — LABETALOL HCL 5 MG/ML IV SOLN: 10.0000 mg | INTRAVENOUS | Status: AC | PRN

## 2019-07-02 MED ORDER — FENTANYL CITRATE (PF) 100 MCG/2ML IJ SOLN
INTRAMUSCULAR | Status: AC
  Filled 2019-07-02: qty 2

## 2019-07-02 MED ORDER — LIDOCAINE HCL (PF) 1 % IJ SOLN
INTRAMUSCULAR | Status: AC
  Filled 2019-07-02: qty 30

## 2019-07-02 MED ORDER — MIDAZOLAM HCL 2 MG/2ML IJ SOLN
INTRAMUSCULAR | Status: AC
  Filled 2019-07-02: qty 2

## 2019-07-02 MED ORDER — HEPARIN SODIUM (PORCINE) 1000 UNIT/ML IJ SOLN
INTRAMUSCULAR | Status: DC | PRN
  Administered 2019-07-02: 3000 [IU] via INTRAVENOUS

## 2019-07-02 MED ORDER — IOHEXOL 350 MG/ML SOLN
INTRAVENOUS | Status: DC | PRN
  Administered 2019-07-02: 40 mL

## 2019-07-02 MED ORDER — SODIUM CHLORIDE 0.9% FLUSH
3.0000 mL | Freq: Two times a day (BID) | INTRAVENOUS | Status: DC
  Administered 2019-07-02 – 2019-07-05 (×5): 3 mL via INTRAVENOUS

## 2019-07-02 MED ORDER — ASPIRIN 81 MG PO CHEW
CHEWABLE_TABLET | ORAL | Status: AC
  Filled 2019-07-02: qty 1

## 2019-07-02 SURGICAL SUPPLY — 10 items
CATH 5FR JL3.5 JR4 ANG PIG MP (CATHETERS) ×2 IMPLANT
DEVICE RAD TR BAND REGULAR (VASCULAR PRODUCTS) ×2 IMPLANT
GLIDESHEATH SLEND SS 6F .021 (SHEATH) ×2 IMPLANT
GUIDEWIRE INQWIRE 1.5J.035X260 (WIRE) ×1 IMPLANT
INQWIRE 1.5J .035X260CM (WIRE) ×2
KIT HEART LEFT (KITS) ×2 IMPLANT
PACK CARDIAC CATHETERIZATION (CUSTOM PROCEDURE TRAY) ×2 IMPLANT
SHEATH PROBE COVER 6X72 (BAG) ×2 IMPLANT
TRANSDUCER W/STOPCOCK (MISCELLANEOUS) ×2 IMPLANT
TUBING CIL FLEX 10 FLL-RA (TUBING) ×2 IMPLANT

## 2019-07-02 NOTE — Discharge Instructions (Addendum)

## 2019-07-02 NOTE — Progress Notes (Signed)
TR BAND REMOVAL  LOCATION:  right radial  DEFLATED PER PROTOCOL:  Yes.    TIME BAND OFF / DRESSING APPLIED:   1415   SITE UPON ARRIVAL:   Level 0  SITE AFTER BAND REMOVAL:  Level 0  CIRCULATION SENSATION AND MOVEMENT:  Within Normal Limits  Yes.    COMMENTS:

## 2019-07-02 NOTE — Progress Notes (Addendum)
Progress Note  Patient Name: Angela Wong Date of Encounter: 07/02/2019  [facility] Cardiologist:Dr. Vernona Rieger [facility])/remotly seen by Dr. Percival Spanish   Subjective   6 out of 10 back pain.  3 out of 10 chest pain.  No shortness of breath.  Inpatient Medications    Scheduled Meds: . aspirin EC  81 mg Oral Daily  . atenolol  12.5 mg Oral Daily  . atorvastatin  40 mg Oral q1800  . cycloSPORINE  1 drop Both Eyes BID  . docusate sodium  400 mg Oral q morning - 10a  . fluticasone furoate-vilanterol  1 puff Inhalation Daily  . levothyroxine  100 mcg Oral Q0600  . loratadine  10 mg Oral Daily  . magnesium oxide  400 mg Oral QPM  . methocarbamol  500 mg Oral TID  . pantoprazole  40 mg Oral Daily  . predniSONE  5 mg Oral q morning - 10a  . sodium chloride flush  3 mL Intravenous Q12H  . traZODone  150 mg Oral QHS   Continuous Infusions: . sodium chloride    . sodium chloride 1 mL/kg/hr (07/02/19 0517)  . heparin 900 Units/hr (07/02/19 0236)  . nitroGLYCERIN 5 mcg/min (07/02/19 0545)   PRN Meds: sodium chloride, acetaminophen, albuterol, morphine, nitroGLYCERIN, ondansetron (ZOFRAN) IV, sodium chloride flush   Vital Signs    Vitals:   07/02/19 0240 07/02/19 0300 07/02/19 0453 07/02/19 0508  BP: (!) 122/53  (!) 99/47 (!) 99/47  Pulse: 66 70 66   Resp: (!) 31 (!) 23 19 20   Temp:   99.4 F (37.4 C) 99.4 F (37.4 C)  TempSrc:   Oral Oral  SpO2: 92% 91% 92% 93%  Weight:    62.4 kg  Height:        Intake/Output Summary (Last 24 hours) at 07/02/2019 0747 Last data filed at 07/01/2019 2200 Gross per 24 hour  Intake 246.01 ml  Output --  Net 246.01 ml   Last 3 Weights 07/02/2019 07/01/2019 07/01/2019  Weight (lbs) 137 lb 9.1 oz 136 lb 9.6 oz 132 lb  Weight (kg) 62.4 kg 61.961 kg 59.875 kg      Telemetry    Atrial fibrillation, intermittent V paced, nonsustained VT- Personally Reviewed  ECG    Atrial fibrillation at 69 bpm- Personally Reviewed  Physical  Exam   GEN: No acute distress.   Neck: No JVD Cardiac:  Irregular, no murmurs, rubs, or gallops.  Respiratory: Clear to auscultation bilaterally. GI: Soft, nontender, non-distended  MS: No edema; No deformity. Neuro:  Nonfocal  Psych: Normal affect   Labs    High Sensitivity Troponin:   Recent Labs  Lab 07/01/19 0933 07/01/19 1153  TROPONINIHS 9 2,277*      Chemistry Recent Labs  Lab 07/01/19 0933 07/02/19 0500  NA 135 131*  K 3.8 3.7  CL 94* 98  CO2 30 25  GLUCOSE 116* 79  BUN 19 18  CREATININE 0.78 0.83  CALCIUM 8.8* 8.1*  GFRNONAA >60 >60  GFRAA >60 >60  ANIONGAP 11 8     Hematology Recent Labs  Lab 07/01/19 0933 07/02/19 0500  WBC 11.1* 9.0  RBC 3.89 3.34*  HGB 12.0 10.2*  HCT 36.9 31.1*  MCV 94.9 93.1  MCH 30.8 30.5  MCHC 32.5 32.8  RDW 14.9 14.7  PLT 191 168     Radiology    DG Chest Portable 1 View  Result Date: 07/01/2019 CLINICAL DATA:  Chest pain. EXAM: PORTABLE CHEST 1 VIEW COMPARISON:  06/22/2019 FINDINGS: Chronic mild cardiomegaly. Pulmonary vascularity is normal. Pacemaker in place. No infiltrates or effusions. No acute bone abnormality. Chronic degenerative changes of both shoulders. IMPRESSION: No acute cardiopulmonary disease. Chronic mild cardiomegaly.  The Electronically Signed   By: Lorriane Shire M.D.   On: 07/01/2019 10:01   CT Angio Chest/Abd/Pel for Dissection W and/or W/WO  Result Date: 07/01/2019 CLINICAL DATA:  Chest pain.  Chronic low back pain EXAM: CT ANGIOGRAPHY CHEST, ABDOMEN AND PELVIS TECHNIQUE: Non-contrast CT of the chest was initially obtained. Multidetector CT imaging through the chest, abdomen and pelvis was performed using the standard protocol during bolus administration of intravenous contrast. Multiplanar reconstructed images and MIPs were obtained and reviewed to evaluate the vascular anatomy. CONTRAST:  131mL OMNIPAQUE IOHEXOL 350 MG/ML SOLN COMPARISON:  12/24/2017 FINDINGS: CTA CHEST FINDINGS Cardiovascular:  Preferential opacification of the thoracic aorta. No evidence of thoracic aortic aneurysm or dissection. Three vessel arch. Stable cardiomegaly. No pericardial effusion. Scattered coronary artery calcifications. Right-sided implanted cardiac device with single lead terminating in the right ventricle. Mediastinum/Nodes: Redemonstration of multiple mildly enlarged mediastinal lymph nodes including 13 mm pretracheal node (series 7, image 37), previously measured 12 mm. 9 mm precarinal node (series 7, image 44), previously measured 6 mm. Borderline enlarged right hilar lymph node. No axillary or left hilar lymphadenopathy. Unremarkable thyroid, trachea, and esophagus. Lungs/Pleura: Trace bilateral pleural effusions with associated compressive atelectasis. Additional bandlike areas of chronic scarring or atelectasis within the bilateral lung bases. No pneumothorax. Musculoskeletal: Chronic mild superior endplate compression deformity of T12 is unchanged. No new or acute osseous findings within the thorax. High-riding left humeral head. Chronic fracture/fragmentation of the posterior left glenoid (series 6, image 13). Review of the MIP images confirms the above findings. CTA ABDOMEN AND PELVIS FINDINGS VASCULAR Aorta: Normal caliber aorta without aneurysm, dissection, vasculitis or significant stenosis. Celiac: Patent without evidence of aneurysm, dissection, vasculitis or significant stenosis. Separate origin of the splenic and common hepatic arteries. SMA: Patent without evidence of aneurysm, dissection, vasculitis or significant stenosis. Renals: Both renal arteries are patent without evidence of aneurysm, dissection, vasculitis, fibromuscular dysplasia or significant stenosis. Single renal artery bilaterally. IMA: Patent. Inflow: Patent without evidence of aneurysm, dissection, vasculitis or significant stenosis. Veins: No obvious venous abnormality within the limitations of this arterial phase study. Review of the  MIP images confirms the above findings. NON-VASCULAR Hepatobiliary: Stable 1.0 cm low-density lesion within the anterior aspect of the right hepatic lobe. Liver is otherwise unremarkable. Gallbladder is mildly distended. No hyperdense gallstone or evidence of pericholecystic inflammation. Pancreas: Mildly atrophic. No pancreatic ductal dilatation or surrounding inflammatory changes. Spleen: Normal in size without focal abnormality. Adrenals/Urinary Tract: Unremarkable adrenal glands. Transverse lie of the right kidney with stable slightly hyperdense 1.8 cm right renal cyst (series 7, image 138). No renal stone or hydronephrosis. Urinary bladder is unremarkable. Stomach/Bowel: Stomach is within normal limits. Appendix not definitively visualized. Extensive colonic diverticulosis. No evidence of bowel wall thickening, distention, or inflammatory changes. Lymphatic: No abdominopelvic lymphadenopathy. Reproductive: Status post hysterectomy. No adnexal masses. Other: No free air.  No free fluid.  No abdominal wall hernia. Musculoskeletal: Severe osteoarthritis of the bilateral hips, which has progressed on the right. No acute osseous abnormality. Multilevel lumbar spondylosis with evidence of high-grade canal stenosis at L3-4 and L4-5. Review of the MIP images confirms the above findings. IMPRESSION: 1. No evidence of thoracic or abdominal aortic aneurysm or dissection. 2. Trace bilateral pleural effusions with associated compressive atelectasis. 3. Mildly enlarged mediastinal lymph nodes,  which have slightly progressed in size from prior CT 12/24/2017. Findings are nonspecific and may be reactive. 4. Extensive colonic diverticulosis without evidence of acute diverticulitis. 5. Severe osteoarthritis of the bilateral hips, which has progressed on the right. 6. Multilevel lumbar spondylosis with evidence of high-grade canal stenosis at L3-4 and L4-5. Electronically Signed   By: Davina Poke D.O.   On: 07/01/2019 12:24      Cardiac Studies   Pending cath today   Echo 06/23/19 1. Left ventricular ejection fraction, by estimation, is 50%. The left  ventricle has low normal function. The left ventricle demonstrates global  hypokinesis. There is mild left ventricular hypertrophy. Left ventricular  diastolic parameters are  indeterminate.  2. Right ventricular systolic function is normal. The right ventricular  size is normal. There is normal pulmonary artery systolic pressure. The  estimated right ventricular systolic pressure is 09.9 mmHg.  3. Left atrial size was moderately dilated.  4. The mitral valve is grossly normal with mild annular calcification.  Mild mitral valve regurgitation.  5. The aortic valve is tricuspid. Aortic valve regurgitation is not  visualized. Mild aortic valve sclerosis is present, with no evidence of  aortic valve stenosis.  6. The inferior vena cava is normal in size with greater than 50%  respiratory variability, suggesting right atrial pressure of 3 mmHg.   Patient Profile     79 y.o. female persistent atrial fibrillation, on Coumadin for anticoagulation, sick sinus syndrome status post permanent pacemaker, dilated cardiomyopathy with presumed nonischemic, Raynaud's, rheumatoid arthritis, COPD, hypertension, asthma, chronic back pain due to spinal stenosis and recent admission 6/8-6/10 CHF exacerbation with echocardiogram showing LV function of 50% presented to [facility] ER from [place] [facility] with substernal chest pressure.  She was found to have elevated troponin and sent to [facility] for cardiac catheterization.  Assessment & Plan    1.  Non-STEMI -High-sensitivity troponin 9>>2277.  INR 1.5.  Still having 3 out of 10 chest pain on nitroglycerin and heparin drip.  For cardiac catheterization later today. The patient understands that risks include but are not limited to stroke (1 in 1000), death (1 in 39), kidney failure [usually temporary] (1 in  500), bleeding (1 in 200), allergic reaction [possibly serious] (1 in 200), and agrees to proceed.  -Continue aspirin, atenolol and statin  2.  Persistent atrial fibrillation -Rate control with intermittent V paced -Coumadin on hold.  She has home INR machine and her Coumadin followed by PCP. -Anticoagulated with heparin  3. HLD - 07/02/2019: Cholesterol 119; HDL 51; LDL Cholesterol 60; Triglycerides 42; VLDL 8  - Continue Lipitor 40mg  qd  4.  Ongoing back pain in setting of spinal stenosis 5.  Rheumatoid arthritis/synovitis -on Prednisone and Robaxin - Will give home PRN percocet instead Morphine   6. CHF - Euvolemic - Bumex on hold   7. HTN - BP soft on BB - Losartan on hold  For questions or updates, please contact [place] Please consult [email or web address] for contact info under        Jarrett Soho, PA  07/02/2019, 7:47 AM     Patient seen and examined. Agree with assessment and plan.  Patient is back from Cath Lab.  Cath data and images reviewed.  25% mid LAD stenosis.  The distal PLA was subtotally occluded felt due to embolic thrombus which may have occurred in the setting of subtherapeutic INR.  She had been on Coumadin.   I had a long discussion with  the patient and family regarding reinstitution of warfarin anticoagulation versus possible transition to Eliquis or Xarelto.  With age would favor Xarelto.  Patient is contemplating.  Currently not having any further anginal symptoms.  Monitor reveals atrial fibrillation with ventricular rate in the 70 to 80s with occasional PVCs.  Positive troponin secondary to non-STEMI from possible distal embolization to PLA vessel.  Medical therapy for CAD.  With occasional PVCs and heart rates in the 70s to 80s will slightly increase atenolol to 12.5 mg twice a day.  I had a long discussion again with family members and the pharmacist will also speak with them regarding ultimate decision of DOAC versus resumption of  warfarin.   Troy Sine, MD, [facility] 07/02/2019 2:28 PM

## 2019-07-02 NOTE — Progress Notes (Signed)
[place] for Heparin Indication: chest pain/ACS  Allergies  Allergen Reactions  . Cephalosporins Hives and Shortness Of Breath  . Ciprofloxacin Hives and Shortness Of Breath  . Diltiazem Shortness Of Breath    Swollen throat   . Doxycycline Hives and Shortness Of Breath  . Horse-Derived Products Anaphylaxis  . Ketek [Telithromycin] Palpitations    Chest discomfort  . Nitrofuran Derivatives Anaphylaxis and Hives    blisters  . Nitrous Oxide Nausea And Vomiting    Severe due to Sjogrens (Auto-Immune Disease)  . Other Anaphylaxis    ALLERGY TO HORSE SERUM  . Penicillins Hives and Shortness Of Breath       . Pentazocine Other (See Comments)    Other reaction(s): Mental Status Changes (intolerance) Altered Mental Status  Altered Mental Status  Altered Mental Status   . Sulfa Antibiotics Hives and Shortness Of Breath  . Trovan [Alatrofloxacin] Palpitations, Other (See Comments) and Anaphylaxis    Chest pain, dizziness, irregular pulse  . Zinc Gelatin [Zinc] Anaphylaxis  . Amlodipine Swelling  . Calcium Channel Blockers     Respiratory distress  . Carvedilol Other (See Comments)    Dizziness, "joint pain, depression"  . Clindamycin/Lincomycin     CP, lock jaw  . Codeine Nausea Only  . Cymbalta [Duloxetine Hcl] Swelling  . Diovan [Valsartan] Swelling  . Lisinopril Swelling  . Sertraline Other (See Comments)    confusion  . Tramadol Nausea Only  . Loteprednol Etabonate Rash    Patient Measurements: Height: 5\' 4"  (162.6 cm) Weight: 62.4 kg (137 lb 9.1 oz) IBW/kg (Calculated) : 54.7 HEPARIN DW (KG): 62  Vital Signs: Temp: 99.4 F (37.4 C) (06/18 0508) Temp Source: Oral (06/18 0508) BP: 98/50 (06/18 0933) Pulse Rate: 80 (06/18 0933)  Labs: Recent Labs    07/01/19 0500 07/01/19 0933 07/01/19 1153 07/01/19 1330 07/01/19 2202 07/02/19 0500  HGB  --  12.0  --   --   --  10.2*  HCT  --  36.9  --   --   --  31.1*  PLT  --   191  --   --   --  168  LABPROT 18.3*  --   --  17.8*  --   --   INR 1.6*  --   --  1.5*  --   --   HEPARINUNFRC  --   --   --   --  0.15*  --   CREATININE  --  0.78  --   --   --  0.83  TROPONINIHS  --  9 2,277*  --   --   --     Estimated Creatinine Clearance: 47.5 mL/min (by C-G formula based on SCr of 0.83 mg/dL).   Assessment: 79 y.o. female with chest pain for heparin. On warfarin PTA for Afib - PTA regimen is 2 mg daily except 4 mg MWF.   Underwent cath 6/18- found to have thrombotic lesion at very distal PLA thought to be embolic event, possibly due to subtherapeutic INR. Consider change to DOAC.   Hgb 10.2, plt 168. No s/sx of bleeding. Plan to restart heparin 8 hours after sheath removal (documented on 6/18@0932 ).   Goal of Therapy:  Heparin level 0.3-0.7 units/ml Monitor platelets by anticoagulation protocol: Yes   Plan:  Restart heparin infusion at 900 units/hr on 6/18@1730  Monitor daily INR, HL, and CBC  F/u plan for oral AC restart  Antonietta Jewel, PharmD, [place] Clinical Pharmacist  Phone: [phone] 07/02/2019  10:40 AM  Please check AMION for all [place] phone numbers After 10:00 PM, call [place] [phone]

## 2019-07-02 NOTE — Progress Notes (Signed)
   07/02/19 1500  Clinical Encounter Type  Visited With Patient and family together  Visit Type Initial  Referral From Patient  Consult/Referral To Chaplain  Spiritual Encounters  Spiritual Needs  Idelle Crouch)   Chaplain engaged in initial visit with Angela Wong, her son, and daughter-in-law.  [name] shared that she does belong to parish in [place], but would like for a local priest to come visit.  Chaplain called priest and is awaiting response.    Chaplain will follow-up.

## 2019-07-02 NOTE — Interval H&P Note (Signed)
History and Physical Interval Note:Cath Lab Visit (complete for each Cath Lab visit)  Clinical Evaluation Leading to the Procedure:   ACS: Yes.    Non-ACS:    Anginal Classification: CCS IV  Anti-ischemic medical therapy: Minimal Therapy (1 class of medications)  Non-Invasive Test Results: No non-invasive testing performed  Prior CABG: No previous CABG        07/02/2019 9:09 AM  Angela Wong  has presented today for surgery, with the diagnosis of nonstemi.  The various methods of treatment have been discussed with the patient and family. After consideration of risks, benefits and other options for treatment, the patient has consented to  Procedure(s): LEFT HEART CATH AND CORONARY ANGIOGRAPHY (N/A) as a surgical intervention.  The patient's history has been reviewed, patient examined, no change in status, stable for surgery.  I have reviewed the patient's chart and labs.  Questions were answered to the patient's satisfaction.     Larae Grooms

## 2019-07-02 NOTE — Progress Notes (Signed)
[place] for Heparin Indication: chest pain/ACS  Allergies  Allergen Reactions  . Cephalosporins Hives and Shortness Of Breath  . Ciprofloxacin Hives and Shortness Of Breath  . Diltiazem Shortness Of Breath    Swollen throat   . Doxycycline Hives and Shortness Of Breath  . Horse-Derived Products Anaphylaxis  . Ketek [Telithromycin] Palpitations    Chest discomfort  . Nitrofuran Derivatives Anaphylaxis and Hives    blisters  . Nitrous Oxide Nausea And Vomiting    Severe due to Sjogrens (Auto-Immune Disease)  . Other Anaphylaxis    ALLERGY TO HORSE SERUM  . Penicillins Hives and Shortness Of Breath       . Pentazocine Other (See Comments)    Other reaction(s): Mental Status Changes (intolerance) Altered Mental Status  Altered Mental Status  Altered Mental Status   . Sulfa Antibiotics Hives and Shortness Of Breath  . Trovan [Alatrofloxacin] Palpitations, Other (See Comments) and Anaphylaxis    Chest pain, dizziness, irregular pulse  . Zinc Gelatin [Zinc] Anaphylaxis  . Amlodipine Swelling  . Calcium Channel Blockers     Respiratory distress  . Carvedilol Other (See Comments)    Dizziness, "joint pain, depression"  . Clindamycin/Lincomycin     CP, lock jaw  . Codeine Nausea Only  . Cymbalta [Duloxetine Hcl] Swelling  . Diovan [Valsartan] Swelling  . Lisinopril Swelling  . Sertraline Other (See Comments)    confusion  . Tramadol Nausea Only  . Loteprednol Etabonate Rash    Patient Measurements: Height: 5\' 4"  (162.6 cm) Weight: 62.4 kg (137 lb 9.1 oz) IBW/kg (Calculated) : 54.7 HEPARIN DW (KG): 62  Vital Signs: Temp: 99.4 F (37.4 C) (06/18 0508) Temp Source: Oral (06/18 0508) BP: 98/50 (06/18 0933) Pulse Rate: 80 (06/18 0933)  Labs: Recent Labs    07/01/19 0500 07/01/19 0933 07/01/19 1153 07/01/19 1330 07/01/19 2202 07/02/19 0500  HGB  --  12.0  --   --   --  10.2*  HCT  --  36.9  --   --   --  31.1*  PLT  --   191  --   --   --  168  LABPROT 18.3*  --   --  17.8*  --   --   INR 1.6*  --   --  1.5*  --   --   HEPARINUNFRC  --   --   --   --  0.15*  --   CREATININE  --  0.78  --   --   --  0.83  TROPONINIHS  --  9 2,277*  --   --   --     Estimated Creatinine Clearance: 47.5 mL/min (by C-G formula based on SCr of 0.83 mg/dL).   Assessment: 79 y.o. female with chest pain for heparin. On warfarin PTA for Afib - PTA regimen is 2 mg daily except 4 mg MWF.   Underwent cath 6/18- found to have thrombotic lesion at very distal PLA thought to be embolic event, possibly due to subtherapeutic INR. Consider change to DOAC.  Spoke to patient at length - she likes seeing value of INR does not want DOAC - will restart warfarin tonight   Hgb 10.2, plt 168. No s/sx of bleeding. Plan to restart heparin 8 hours after sheath removal (documented on 6/18@0932 ).   PTA warfarin dose 4mg  MWF and 2mg  TTSS  Goal of Therapy:  Heparin level 0.3-0.7 units/ml Monitor platelets by anticoagulation protocol: Yes   Plan:  Restart  heparin infusion at 900 units/hr on 6/18@1730  Monitor daily INR, HL, and CBC  Restart warfarin 6mg  x1 tonight as boost   Bonnita Nasuti Pharm.D. CPP, BCPS Clinical Pharmacist [phone] 07/02/2019 1:10 PM    Please check AMION for all [place] phone numbers After 10:00 PM, call [place] [phone]

## 2019-07-03 DIAGNOSIS — I5032 Chronic diastolic (congestive) heart failure: Secondary | ICD-10-CM

## 2019-07-03 DIAGNOSIS — I493 Ventricular premature depolarization: Secondary | ICD-10-CM

## 2019-07-03 DIAGNOSIS — I10 Essential (primary) hypertension: Secondary | ICD-10-CM

## 2019-07-03 LAB — CBC
HCT: 31.2 % — ABNORMAL LOW (ref 36.0–46.0)
Hemoglobin: 10.3 g/dL — ABNORMAL LOW (ref 12.0–15.0)
MCH: 31.2 pg (ref 26.0–34.0)
MCHC: 33 g/dL (ref 30.0–36.0)
MCV: 94.5 fL (ref 80.0–100.0)
Platelets: 170 10*3/uL (ref 150–400)
RBC: 3.3 MIL/uL — ABNORMAL LOW (ref 3.87–5.11)
RDW: 14.6 % (ref 11.5–15.5)
WBC: 7.2 10*3/uL (ref 4.0–10.5)
nRBC: 0 % (ref 0.0–0.2)

## 2019-07-03 LAB — BASIC METABOLIC PANEL
Anion gap: 7 (ref 5–15)
BUN: 18 mg/dL (ref 8–23)
CO2: 24 mmol/L (ref 22–32)
Calcium: 8.1 mg/dL — ABNORMAL LOW (ref 8.9–10.3)
Chloride: 99 mmol/L (ref 98–111)
Creatinine, Ser: 0.77 mg/dL (ref 0.44–1.00)
GFR calc Af Amer: >60 mL/min (ref >60)
GFR calc non Af Amer: >60 mL/min (ref >60)
Glucose, Bld: 99 mg/dL (ref 70–99)
Potassium: 4.8 mmol/L (ref 3.5–5.1)
Sodium: 130 mmol/L — ABNORMAL LOW (ref 135–145)

## 2019-07-03 LAB — HEPARIN LEVEL (UNFRACTIONATED)
Heparin Unfractionated: <0.1 IU/mL — ABNORMAL LOW (ref 0.30–0.70)
Heparin Unfractionated: 0.22 IU/mL — ABNORMAL LOW (ref 0.30–0.70)
Heparin Unfractionated: 0.39 IU/mL (ref 0.30–0.70)

## 2019-07-03 LAB — PROTIME-INR
INR: 1.6 — ABNORMAL HIGH (ref 0.8–1.2)
Prothrombin Time: 18.6 seconds — ABNORMAL HIGH (ref 11.4–15.2)

## 2019-07-03 MED ORDER — ATENOLOL 25 MG PO TABS
12.5000 mg | ORAL_TABLET | Freq: Two times a day (BID) | ORAL | Status: DC
  Administered 2019-07-03 – 2019-07-05 (×5): 12.5 mg via ORAL
  Filled 2019-07-03 (×5): qty 1

## 2019-07-03 MED ORDER — METHOCARBAMOL 500 MG PO TABS
250.0000 mg | ORAL_TABLET | Freq: Every day | ORAL | Status: AC
  Administered 2019-07-03: 250 mg via ORAL
  Filled 2019-07-03: qty 1

## 2019-07-03 MED ORDER — METHOCARBAMOL 500 MG PO TABS: 500.0000 mg | ORAL_TABLET | Freq: Every day | ORAL | Status: DC

## 2019-07-03 MED ORDER — BUMETANIDE 1 MG PO TABS
1.0000 mg | ORAL_TABLET | ORAL | Status: DC
  Administered 2019-07-03 – 2019-07-05 (×2): 1 mg via ORAL
  Filled 2019-07-03 (×3): qty 1

## 2019-07-03 MED ORDER — BUMETANIDE 0.5 MG PO TABS
0.5000 mg | ORAL_TABLET | ORAL | Status: DC
  Administered 2019-07-04: 0.5 mg via ORAL
  Filled 2019-07-03: qty 1

## 2019-07-03 MED ORDER — WARFARIN SODIUM 5 MG PO TABS
6.0000 mg | ORAL_TABLET | Freq: Once | ORAL | Status: AC
  Administered 2019-07-03: 6 mg via ORAL
  Filled 2019-07-03: qty 1

## 2019-07-03 MED ORDER — SIMETHICONE 80 MG PO CHEW
80.0000 mg | CHEWABLE_TABLET | Freq: Four times a day (QID) | ORAL | Status: DC | PRN
  Administered 2019-07-03 – 2019-07-05 (×2): 80 mg via ORAL
  Filled 2019-07-03 (×2): qty 1

## 2019-07-03 NOTE — Progress Notes (Signed)
[place] for Heparin & Coumadin Indication: chest pain/ACS  Allergies  Allergen Reactions  . Cephalosporins Hives and Shortness Of Breath  . Ciprofloxacin Hives and Shortness Of Breath  . Diltiazem Shortness Of Breath    Swollen throat   . Doxycycline Hives and Shortness Of Breath  . Horse-Derived Products Anaphylaxis  . Ketek [Telithromycin] Palpitations    Chest discomfort  . Nitrofuran Derivatives Anaphylaxis and Hives    blisters  . Nitrous Oxide Nausea And Vomiting    Severe due to Sjogrens (Auto-Immune Disease)  . Other Anaphylaxis    ALLERGY TO HORSE SERUM  . Penicillins Hives and Shortness Of Breath       . Pentazocine Other (See Comments)    Other reaction(s): Mental Status Changes (intolerance) Altered Mental Status  Altered Mental Status  Altered Mental Status   . Sulfa Antibiotics Hives and Shortness Of Breath  . Trovan [Alatrofloxacin] Palpitations, Other (See Comments) and Anaphylaxis    Chest pain, dizziness, irregular pulse  . Zinc Gelatin [Zinc] Anaphylaxis  . Amlodipine Swelling  . Calcium Channel Blockers     Respiratory distress  . Carvedilol Other (See Comments)    Dizziness, "joint pain, depression"  . Clindamycin/Lincomycin     CP, lock jaw  . Codeine Nausea Only  . Cymbalta [Duloxetine Hcl] Swelling  . Diovan [Valsartan] Swelling  . Lisinopril Swelling  . Sertraline Other (See Comments)    confusion  . Tramadol Nausea Only  . Loteprednol Etabonate Rash    Patient Measurements: Height: 5\' 4"  (162.6 cm) Weight: 68.4 kg (150 lb 12.7 oz) IBW/kg (Calculated) : 54.7 HEPARIN DW (KG): 62  Vital Signs: Temp: 98.1 F (36.7 C) (06/19 0818) Temp Source: Oral (06/19 0818) BP: 125/63 (06/19 0820) Pulse Rate: 59 (06/19 0820)  Labs: Recent Labs    07/01/19 0500 07/01/19 0933 07/01/19 0933 07/01/19 1153 07/01/19 1330 07/01/19 2202 07/02/19 0500 07/03/19 0006 07/03/19 1029  HGB  --  12.0   < >  --    --   --  10.2* 10.3*  --   HCT  --  36.9  --   --   --   --  31.1* 31.2*  --   PLT  --  191  --   --   --   --  168 170  --   LABPROT 18.3*  --   --   --  17.8*  --   --  18.6*  --   INR 1.6*  --   --   --  1.5*  --   --  1.6*  --   HEPARINUNFRC  --   --   --   --   --  0.15*  --  <0.10* 0.22*  CREATININE  --  0.78  --   --   --   --  0.83 0.77  --   TROPONINIHS  --  9  --  2,277*  --   --   --   --   --    < > = values in this interval not displayed.    Estimated Creatinine Clearance: 54.2 mL/min (by C-G formula based on SCr of 0.77 mg/dL).   Assessment: 79 y.o. female with h/o Afib s/p cath for heparin. Underwent cath 6/18- found to have thrombotic lesion at very distal PLA thought to be embolic event, possibly due to subtherapeutic INR. Consider change to DOAC. Patient is resistant to change to DOAC d/t to fear of cost. [facility] consult  placed to evaluate cost of apixaban and rivaroxaban.   On Coumadin PTA for Afib - PTA regimen is 2 mg daily except 4 mg MWF.  Coumadin boost dose of 6mg  given 6/18.  Heparin level this AM remains subtherapeutic at 0.22 despite rate increase to 1050 units/hr. INR remains subtherapeutic at 1.6. Restarted on Coumadin 6/18.   CBC stable and no bleeding per nurse.   Goal of Therapy:  Heparin level 0.3-0.7 units/ml Monitor platelets by anticoagulation protocol: Yes   Plan:  Increase heparin infusion to 1200 units/hr Check heparin level in 8 hrs Coumadin 6 mg boost again tonight  Monitor daily heparin level and CBC while on heparin Monitor daily INR while on Coumadin Watch for s/sx of bleeding Plan is for discharge tomorrow most likely. Will discuss oral anticoagulant options tomorrow when we have more info regarding cost of DOACs. If she is going home on warfarin, she will need enoxaparin bridge.    Kennon Holter, PharmD PGY1 Ambulatory Care Pharmacy Resident

## 2019-07-03 NOTE — Progress Notes (Signed)
[place] for Heparin Indication: chest pain/ACS  Allergies  Allergen Reactions  . Cephalosporins Hives and Shortness Of Breath  . Ciprofloxacin Hives and Shortness Of Breath  . Diltiazem Shortness Of Breath    Swollen throat   . Doxycycline Hives and Shortness Of Breath  . Horse-Derived Products Anaphylaxis  . Ketek [Telithromycin] Palpitations    Chest discomfort  . Nitrofuran Derivatives Anaphylaxis and Hives    blisters  . Nitrous Oxide Nausea And Vomiting    Severe due to Sjogrens (Auto-Immune Disease)  . Other Anaphylaxis    ALLERGY TO HORSE SERUM  . Penicillins Hives and Shortness Of Breath       . Pentazocine Other (See Comments)    Other reaction(s): Mental Status Changes (intolerance) Altered Mental Status  Altered Mental Status  Altered Mental Status   . Sulfa Antibiotics Hives and Shortness Of Breath  . Trovan [Alatrofloxacin] Palpitations, Other (See Comments) and Anaphylaxis    Chest pain, dizziness, irregular pulse  . Zinc Gelatin [Zinc] Anaphylaxis  . Amlodipine Swelling  . Calcium Channel Blockers     Respiratory distress  . Carvedilol Other (See Comments)    Dizziness, "joint pain, depression"  . Clindamycin/Lincomycin     CP, lock jaw  . Codeine Nausea Only  . Cymbalta [Duloxetine Hcl] Swelling  . Diovan [Valsartan] Swelling  . Lisinopril Swelling  . Sertraline Other (See Comments)    confusion  . Tramadol Nausea Only  . Loteprednol Etabonate Rash    Patient Measurements: Height: 5\' 4"  (162.6 cm) Weight: 62.4 kg (137 lb 9.1 oz) IBW/kg (Calculated) : 54.7 HEPARIN DW (KG): 62  Vital Signs: Temp: 98.2 F (36.8 C) (06/18 1927) Temp Source: Oral (06/18 1927) BP: 102/46 (06/18 1927) Pulse Rate: 59 (06/19 0000)  Labs: Recent Labs    07/01/19 0500 07/01/19 0933 07/01/19 0933 07/01/19 1153 07/01/19 1330 07/01/19 2202 07/02/19 0500 07/03/19 0006  HGB  --  12.0   < >  --   --   --  10.2* 10.3*  HCT   --  36.9  --   --   --   --  31.1* 31.2*  PLT  --  191  --   --   --   --  168 170  LABPROT 18.3*  --   --   --  17.8*  --   --  18.6*  INR 1.6*  --   --   --  1.5*  --   --  1.6*  HEPARINUNFRC  --   --   --   --   --  0.15*  --  <0.10*  CREATININE  --  0.78  --   --   --   --  0.83 0.77  TROPONINIHS  --  9  --  2,277*  --   --   --   --    < > = values in this interval not displayed.    Estimated Creatinine Clearance: 49.2 mL/min (by C-G formula based on SCr of 0.77 mg/dL).   Assessment: 79 y.o. female with h/o Afib s/p cath for heparin  Goal of Therapy:  Heparin level 0.3-0.7 units/ml Monitor platelets by anticoagulation protocol: Yes   Plan:  Increase Heparin 1050 units/hr Check heparin level in 8 hours.   Phillis Knack, PharmD, BCPS

## 2019-07-03 NOTE — Progress Notes (Addendum)
Progress Note  Patient Name: Angela Wong Date of Encounter: 07/03/2019  [facility] Cardiologist:Dr. Vernona Rieger [facility] [place])/remotly seen by Dr. Percival Spanish   Subjective   Denies any chest pain or SOB.   Inpatient Medications    Scheduled Meds: . aspirin EC  81 mg Oral Daily  . atenolol  12.5 mg Oral Daily  . atorvastatin  40 mg Oral q1800  . cycloSPORINE  1 drop Both Eyes BID  . docusate sodium  400 mg Oral q morning - 10a  . fluticasone furoate-vilanterol  1 puff Inhalation Daily  . levothyroxine  100 mcg Oral Q0600  . loratadine  10 mg Oral Daily  . magnesium oxide  400 mg Oral QPM  . methocarbamol  500 mg Oral TID  . pantoprazole  40 mg Oral Daily  . predniSONE  5 mg Oral q morning - 10a  . sodium chloride flush  3 mL Intravenous Q12H  . sodium chloride flush  3 mL Intravenous Q12H  . traZODone  150 mg Oral QHS  . Warfarin - Pharmacist Dosing Inpatient   Does not apply q1600   Continuous Infusions: . sodium chloride    . heparin 1,050 Units/hr (07/03/19 0735)  . nitroGLYCERIN Stopped (07/02/19 0935)   PRN Meds: sodium chloride, acetaminophen, acetaminophen, albuterol, morphine, nitroGLYCERIN, ondansetron (ZOFRAN) IV, ondansetron (ZOFRAN) IV, oxyCODONE-acetaminophen, sodium chloride flush   Vital Signs    Vitals:   07/03/19 0500 07/03/19 0513 07/03/19 0545 07/03/19 0818  BP:  109/63  125/63  Pulse:  63 62 68  Resp: 19  17 17   Temp:  97.9 F (36.6 C)  98.1 F (36.7 C)  TempSrc:  Oral  Oral  SpO2:  96% 92% 98%  Weight:  68.4 kg    Height:        Intake/Output Summary (Last 24 hours) at 07/03/2019 0851 Last data filed at 07/03/2019 3149 Gross per 24 hour  Intake --  Output 800 ml  Net -800 ml   Last 3 Weights 07/03/2019 07/02/2019 07/01/2019  Weight (lbs) 150 lb 12.7 oz 137 lb 9.1 oz 136 lb 9.6 oz  Weight (kg) 68.4 kg 62.4 kg 61.961 kg      Telemetry    Atrial fibrillation with intermittent V pacing- Personally Reviewed  ECG    No new EKG to  review- Personally Reviewed  Physical Exam   GEN: Well nourished, well developed in no acute distress HEENT: Normal NECK: No JVD; No carotid bruits LYMPHATICS: No lymphadenopathy CARDIAC:irregularly irregular, no murmurs, rubs, gallops RESPIRATORY:  Clear to auscultation without rales, wheezing or rhonchi  ABDOMEN: Soft, non-tender, non-distended MUSCULOSKELETAL:  No edema; No deformity  SKIN: Warm and dry NEUROLOGIC:  Alert and oriented x 3 PSYCHIATRIC:  Normal affect    Labs    High Sensitivity Troponin:   Recent Labs  Lab 07/01/19 0933 07/01/19 1153  TROPONINIHS 9 2,277*      Chemistry Recent Labs  Lab 07/01/19 0933 07/02/19 0500 07/03/19 0006  NA 135 131* 130*  K 3.8 3.7 4.8  CL 94* 98 99  CO2 30 25 24   GLUCOSE 116* 79 99  BUN 19 18 18   CREATININE 0.78 0.83 0.77  CALCIUM 8.8* 8.1* 8.1*  GFRNONAA >60 >60 >60  GFRAA >60 >60 >60  ANIONGAP 11 8 7      Hematology Recent Labs  Lab 07/01/19 0933 07/02/19 0500 07/03/19 0006  WBC 11.1* 9.0 7.2  RBC 3.89 3.34* 3.30*  HGB 12.0 10.2* 10.3*  HCT 36.9 31.1* 31.2*  MCV 94.9 93.1 94.5  MCH 30.8 30.5 31.2  MCHC 32.5 32.8 33.0  RDW 14.9 14.7 14.6  PLT 191 168 170     Radiology    CARDIAC CATHETERIZATION  Result Date: 07/02/2019  Mid LAD lesion is 25% stenosed.  RPAV lesion is 99% stenosed. This is a thrombotic lesion at the very distal PLA.  The left ventricular systolic function is normal.  LV end diastolic pressure is normal.  The left ventricular ejection fraction is 55-65% by visual estimate.  There is no aortic valve stenosis.  I suspect that her INR dropped and she had an embolic event with thrombus ending up in her distal PLA.  It was too small for intervention at that part of the vessel.  Continue medical therapy.  The patient is currently pain-free. I encouraged her to consider using Eliquis or Xarelto, rather than Coumadin to reduce the likelihood of events like this.  She states she is comfortable  on Coumadin.  I spoke to her son Merry Proud.  He asked that the team encourage her to switch to either Eliquis or Xarelto.  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 The son also asked that we encourage her to get the Covid vaccine.   DG Chest Portable 1 View  Result Date: 07/01/2019 CLINICAL DATA:  Chest pain. EXAM: PORTABLE CHEST 1 VIEW COMPARISON:  06/22/2019 FINDINGS: Chronic mild cardiomegaly. Pulmonary vascularity is normal. Pacemaker in place. No infiltrates or effusions. No acute bone abnormality. Chronic degenerative changes of both shoulders. IMPRESSION: No acute cardiopulmonary disease. Chronic mild cardiomegaly.  The Electronically Signed   By: Lorriane Shire M.D.   On: 07/01/2019 10:01   CT Angio Chest/Abd/Pel for Dissection W and/or W/WO  Result Date: 07/01/2019 CLINICAL DATA:  Chest pain.  Chronic low back pain EXAM: CT ANGIOGRAPHY CHEST, ABDOMEN AND PELVIS TECHNIQUE: Non-contrast CT of the chest was initially obtained. Multidetector CT imaging through the chest, abdomen and pelvis was performed using the standard protocol during bolus administration of intravenous contrast. Multiplanar reconstructed images and MIPs were obtained and reviewed to evaluate the vascular anatomy. CONTRAST:  140mL OMNIPAQUE IOHEXOL 350 MG/ML SOLN COMPARISON:  12/24/2017 FINDINGS: CTA CHEST FINDINGS Cardiovascular: Preferential opacification of the thoracic aorta. No evidence of thoracic aortic aneurysm or dissection. Three vessel arch. Stable cardiomegaly. No pericardial effusion. Scattered coronary artery calcifications. Right-sided implanted cardiac device with single lead terminating in the right ventricle. Mediastinum/Nodes: Redemonstration of multiple mildly enlarged mediastinal lymph nodes including 13 mm pretracheal node (series 7, image 37), previously measured 12 mm. 9 mm precarinal node (series 7, image 44), previously measured 6 mm. Borderline  enlarged right hilar lymph node. No axillary or left hilar lymphadenopathy. Unremarkable thyroid, trachea, and esophagus. Lungs/Pleura: Trace bilateral pleural effusions with associated compressive atelectasis. Additional bandlike areas of chronic scarring or atelectasis within the bilateral lung bases. No pneumothorax. Musculoskeletal: Chronic mild superior endplate compression deformity of T12 is unchanged. No new or acute osseous findings within the thorax. High-riding left humeral head. Chronic fracture/fragmentation of the posterior left glenoid (series 6, image 13). Review of the MIP images confirms the above findings. CTA ABDOMEN AND PELVIS FINDINGS VASCULAR Aorta: Normal caliber aorta without aneurysm, dissection, vasculitis or significant stenosis. Celiac: Patent without evidence of aneurysm, dissection, vasculitis or significant stenosis. Separate origin of the splenic and common hepatic arteries. SMA: Patent without evidence of aneurysm, dissection, vasculitis or significant stenosis. Renals: Both renal arteries  are patent without evidence of aneurysm, dissection, vasculitis, fibromuscular dysplasia or significant stenosis. Single renal artery bilaterally. IMA: Patent. Inflow: Patent without evidence of aneurysm, dissection, vasculitis or significant stenosis. Veins: No obvious venous abnormality within the limitations of this arterial phase study. Review of the MIP images confirms the above findings. NON-VASCULAR Hepatobiliary: Stable 1.0 cm low-density lesion within the anterior aspect of the right hepatic lobe. Liver is otherwise unremarkable. Gallbladder is mildly distended. No hyperdense gallstone or evidence of pericholecystic inflammation. Pancreas: Mildly atrophic. No pancreatic ductal dilatation or surrounding inflammatory changes. Spleen: Normal in size without focal abnormality. Adrenals/Urinary Tract: Unremarkable adrenal glands. Transverse lie of the right kidney with stable slightly  hyperdense 1.8 cm right renal cyst (series 7, image 138). No renal stone or hydronephrosis. Urinary bladder is unremarkable. Stomach/Bowel: Stomach is within normal limits. Appendix not definitively visualized. Extensive colonic diverticulosis. No evidence of bowel wall thickening, distention, or inflammatory changes. Lymphatic: No abdominopelvic lymphadenopathy. Reproductive: Status post hysterectomy. No adnexal masses. Other: No free air.  No free fluid.  No abdominal wall hernia. Musculoskeletal: Severe osteoarthritis of the bilateral hips, which has progressed on the right. No acute osseous abnormality. Multilevel lumbar spondylosis with evidence of high-grade canal stenosis at L3-4 and L4-5. Review of the MIP images confirms the above findings. IMPRESSION: 1. No evidence of thoracic or abdominal aortic aneurysm or dissection. 2. Trace bilateral pleural effusions with associated compressive atelectasis. 3. Mildly enlarged mediastinal lymph nodes, which have slightly progressed in size from prior CT 12/24/2017. Findings are nonspecific and may be reactive. 4. Extensive colonic diverticulosis without evidence of acute diverticulitis. 5. Severe osteoarthritis of the bilateral hips, which has progressed on the right. 6. Multilevel lumbar spondylosis with evidence of high-grade canal stenosis at L3-4 and L4-5. Electronically Signed   By: Davina Poke D.O.   On: 07/01/2019 12:24    Cardiac Studies   Pending cath today   Echo 06/23/19 1. Left ventricular ejection fraction, by estimation, is 50%. The left  ventricle has low normal function. The left ventricle demonstrates global  hypokinesis. There is mild left ventricular hypertrophy. Left ventricular  diastolic parameters are  indeterminate.  2. Right ventricular systolic function is normal. The right ventricular  size is normal. There is normal pulmonary artery systolic pressure. The  estimated right ventricular systolic pressure is 23.7 mmHg.  3.  Left atrial size was moderately dilated.  4. The mitral valve is grossly normal with mild annular calcification.  Mild mitral valve regurgitation.  5. The aortic valve is tricuspid. Aortic valve regurgitation is not  visualized. Mild aortic valve sclerosis is present, with no evidence of  aortic valve stenosis.  6. The inferior vena cava is normal in size with greater than 50%  respiratory variability, suggesting right atrial pressure of 3 mmHg.   Cardiac Cath 06/2019 Conclusion    Mid LAD lesion is 25% stenosed.  RPAV lesion is 99% stenosed. This is a thrombotic lesion at the very distal PLA.  The left ventricular systolic function is normal.  LV end diastolic pressure is normal.  The left ventricular ejection fraction is 55-65% by visual estimate.  There is no aortic valve stenosis.   I suspect that her INR dropped and she had an embolic event with thrombus ending up in her distal PLA.  It was too small for intervention at that part of the vessel.  Continue medical therapy.  The patient is currently pain-free.  I encouraged her to consider using Eliquis or Xarelto, rather than Coumadin  to reduce the likelihood of events like this.  She states she is comfortable on Coumadin.  I spoke to her son Merry Proud.  He asked that the team encourage her to switch to either Eliquis or Xarelto.  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  The son also asked that we encourage her to get the Covid vaccine.   Patient Profile     79 y.o. female persistent atrial fibrillation, on Coumadin for anticoagulation, sick sinus syndrome status post permanent pacemaker, dilated cardiomyopathy with presumed nonischemic, Raynaud's, rheumatoid arthritis, COPD, hypertension, asthma, chronic back pain due to spinal stenosis and recent admission 6/8-6/10 CHF exacerbation with echocardiogram showing LV function of 50% presented to [facility] ER  from [facility] with substernal chest pressure.  She was found to have elevated troponin and sent to [facility] for cardiac catheterization.  Assessment & Plan    1.  Non-STEMI -High-sensitivity troponin 9>>2277. -cath yesterday with 99% distal PLA too small for PCI and felt to likely be embolic from subtherapeutic INR -it was recommended that patient switch from coumadin to DOAC but she refuses -restarted on Coumadin last night per pharmacy -Continue aspirin, atenolol and statin  2.  Persistent atrial fibrillation -Rate control with intermittent V paced -Coumadin was restarted last night>>patient refuses to change to DOAC. -She has home INR machine and her Coumadin followed by PCP. -Anticoagulated with heparin currently until INR is therapuetic loading with Coumadin per phamacy -she says that she would consider Eliquis or Xarelto but wants information about it and also needs to know out of pocket cost.  3. HLD - 07/02/2019: Cholesterol 119; HDL 51; LDL Cholesterol 60; Triglycerides 42; VLDL 8  - Continue Lipitor 40mg  qd  4.  Rheumatoid arthritis/synovitis -on Prednisone and Robaxin -Will give home PRN percocet instead Morphine   5. Chronic diastolic CHF -2D echo this admit with EF 50% -appears euvolemic on exam -LVEDP 32mmHg at cath -Bumex had been on hold for soft BP and cath  -creatinine normal post cath at 0.77 -restart home dose of Bumex 0.5mg  qod alt with 1mg  qod  6. HTN -BP table at 125/46mmHg but prior SBPs in the low 100's -continue Atenolol 12.5mg  BID -continue to hold Losartan and followup outpt  7.  PVCs -noted to have NSVT on tele a few days ago -Dr. Claiborne Billings wrote in note to increase Atenolol  to 12.5mg  BID but was not done but will increase today  8.  Disposition -she was at [facility] for rehab -she tells me she is not going back there and needs to go to another rehab when she leaves -will consult Case management  Patient has  not gotten the COVID 19 vaccine because she is concerned that she has had allergic reactions to things in the past.  SHe has a gelatin allergy.  She would like to talk with pharmacy before agreeing to vaccine  I have spent a total of 40 minutes with patient reviewing cardiac cath, 2D echo , telemetry, EKGs, labs and examining patient as well as establishing an assessment and plan that was discussed with the patient.  > 50% of time was spent in direct patient care.    For questions or updates, please contact [place] Please consult [email or web address] for contact info under        Signed, Fransico Him, MD  07/03/2019, 8:51 AM

## 2019-07-03 NOTE — Progress Notes (Signed)
[place] for Heparin & Coumadin Indication: chest pain/ACS  Allergies  Allergen Reactions  . Cephalosporins Hives and Shortness Of Breath  . Ciprofloxacin Hives and Shortness Of Breath  . Diltiazem Shortness Of Breath    Swollen throat   . Doxycycline Hives and Shortness Of Breath  . Horse-Derived Products Anaphylaxis  . Ketek [Telithromycin] Palpitations    Chest discomfort  . Nitrofuran Derivatives Anaphylaxis and Hives    blisters  . Nitrous Oxide Nausea And Vomiting    Severe due to Sjogrens (Auto-Immune Disease)  . Other Anaphylaxis    ALLERGY TO HORSE SERUM  . Penicillins Hives and Shortness Of Breath       . Pentazocine Other (See Comments)    Other reaction(s): Mental Status Changes (intolerance) Altered Mental Status  Altered Mental Status  Altered Mental Status   . Sulfa Antibiotics Hives and Shortness Of Breath  . Trovan [Alatrofloxacin] Palpitations, Other (See Comments) and Anaphylaxis    Chest pain, dizziness, irregular pulse  . Zinc Gelatin [Zinc] Anaphylaxis  . Amlodipine Swelling  . Calcium Channel Blockers     Respiratory distress  . Carvedilol Other (See Comments)    Dizziness, "joint pain, depression"  . Clindamycin/Lincomycin     CP, lock jaw  . Codeine Nausea Only  . Cymbalta [Duloxetine Hcl] Swelling  . Diovan [Valsartan] Swelling  . Lisinopril Swelling  . Sertraline Other (See Comments)    confusion  . Tramadol Nausea Only  . Loteprednol Etabonate Rash    Patient Measurements: Height: 5\' 4"  (162.6 cm) Weight: 68.4 kg (150 lb 12.7 oz) IBW/kg (Calculated) : 54.7 HEPARIN DW (KG): 62  Vital Signs: Temp: 98.9 F (37.2 C) (06/19 2015) Temp Source: Oral (06/19 2015) BP: 93/50 (06/19 2015) Pulse Rate: 74 (06/19 2015)  Labs: Recent Labs     0000 07/01/19 0500 07/01/19 0933 07/01/19 1153 07/01/19 1330 07/01/19 2202 07/02/19 0500 07/03/19 0006 07/03/19 1029 07/03/19 2019  HGB   < >  --  12.0   --   --   --  10.2* 10.3*  --   --   HCT  --   --  36.9  --   --   --  31.1* 31.2*  --   --   PLT  --   --  191  --   --   --  168 170  --   --   LABPROT  --  18.3*  --   --  17.8*  --   --  18.6*  --   --   INR  --  1.6*  --   --  1.5*  --   --  1.6*  --   --   HEPARINUNFRC  --   --   --   --   --    < >  --  <0.10* 0.22* 0.39  CREATININE  --   --  0.78  --   --   --  0.83 0.77  --   --   TROPONINIHS  --   --  9 2,277*  --   --   --   --   --   --    < > = values in this interval not displayed.    Estimated Creatinine Clearance: 54.2 mL/min (by C-G formula based on SCr of 0.77 mg/dL).   Assessment: 79 y.o. female with h/o Afib s/p cath for heparin. Underwent cath 6/18- found to have thrombotic lesion at very distal PLA thought  to be embolic event, possibly due to subtherapeutic INR. Consider change to DOAC. Patient is resistant to change to DOAC d/t to fear of cost. TOC consult placed to evaluate cost of apixaban and rivaroxaban.   On Coumadin PTA for Afib - PTA regimen is 2 mg daily except 4 mg MWF.  Coumadin boost dose of 6mg  given 6/18.  Heparin level therapeutic after rate adjustment.  Goal of Therapy:  Heparin level 0.3-0.7 units/ml Monitor platelets by anticoagulation protocol: Yes   Plan:  -Continue heparin 1200 units/h -Daily heparin level and CBC   Arrie Senate, PharmD, BCPS Clinical Pharmacist [phone] Please check AMION for all [place] numbers 07/03/2019

## 2019-07-04 LAB — CBC
HCT: 33.6 % — ABNORMAL LOW (ref 36.0–46.0)
Hemoglobin: 11 g/dL — ABNORMAL LOW (ref 12.0–15.0)
MCH: 30.8 pg (ref 26.0–34.0)
MCHC: 32.7 g/dL (ref 30.0–36.0)
MCV: 94.1 fL (ref 80.0–100.0)
Platelets: 198 10*3/uL (ref 150–400)
RBC: 3.57 MIL/uL — ABNORMAL LOW (ref 3.87–5.11)
RDW: 14.6 % (ref 11.5–15.5)
WBC: 5.7 10*3/uL (ref 4.0–10.5)
nRBC: 0 % (ref 0.0–0.2)

## 2019-07-04 LAB — BASIC METABOLIC PANEL
Anion gap: 8 (ref 5–15)
BUN: 14 mg/dL (ref 8–23)
CO2: 27 mmol/L (ref 22–32)
Calcium: 8.6 mg/dL — ABNORMAL LOW (ref 8.9–10.3)
Chloride: 101 mmol/L (ref 98–111)
Creatinine, Ser: 0.78 mg/dL (ref 0.44–1.00)
GFR calc Af Amer: >60 mL/min (ref >60)
GFR calc non Af Amer: >60 mL/min (ref >60)
Glucose, Bld: 93 mg/dL (ref 70–99)
Potassium: 4.1 mmol/L (ref 3.5–5.1)
Sodium: 136 mmol/L (ref 135–145)

## 2019-07-04 LAB — HEPARIN LEVEL (UNFRACTIONATED): Heparin Unfractionated: 0.65 IU/mL (ref 0.30–0.70)

## 2019-07-04 LAB — PROTIME-INR
INR: 2.8 — ABNORMAL HIGH (ref 0.8–1.2)
Prothrombin Time: 28.4 seconds — ABNORMAL HIGH (ref 11.4–15.2)

## 2019-07-04 NOTE — Progress Notes (Signed)
Progress Note  Patient Name: Angela Wong Date of Encounter: 07/04/2019  [facility] Cardiologist:Dr. Vernona Rieger [facility])/remotly seen by Dr. Percival Spanish   Subjective   Denies any chest pain or SOB  Inpatient Medications    Scheduled Meds: . aspirin EC  81 mg Oral Daily  . atenolol  12.5 mg Oral BID  . atorvastatin  40 mg Oral q1800  . bumetanide  0.5 mg Oral QODAY  . bumetanide  1 mg Oral QODAY  . cycloSPORINE  1 drop Both Eyes BID  . docusate sodium  400 mg Oral q morning - 10a  . fluticasone furoate-vilanterol  1 puff Inhalation Daily  . levothyroxine  100 mcg Oral Q0600  . loratadine  10 mg Oral Daily  . magnesium oxide  400 mg Oral QPM  . methocarbamol  500 mg Oral TID  . pantoprazole  40 mg Oral Daily  . predniSONE  5 mg Oral q morning - 10a  . sodium chloride flush  3 mL Intravenous Q12H  . sodium chloride flush  3 mL Intravenous Q12H  . traZODone  150 mg Oral QHS  . Warfarin - Pharmacist Dosing Inpatient   Does not apply q1600   Continuous Infusions: . sodium chloride    . heparin 1,200 Units/hr (07/03/19 2318)  . nitroGLYCERIN Stopped (07/02/19 0935)   PRN Meds: sodium chloride, acetaminophen, acetaminophen, albuterol, morphine, nitroGLYCERIN, ondansetron (ZOFRAN) IV, ondansetron (ZOFRAN) IV, oxyCODONE-acetaminophen, simethicone, sodium chloride flush   Vital Signs    Vitals:   07/04/19 0400 07/04/19 0500 07/04/19 0559 07/04/19 0600  BP:   112/69   Pulse: 61 60 (!) 52 (!) 114  Resp: 16 15  14   Temp:   98.2 F (36.8 C)   TempSrc:   Oral   SpO2: 94% 94% 97% 96%  Weight:   63.9 kg   Height:        Intake/Output Summary (Last 24 hours) at 07/04/2019 5465 Last data filed at 07/04/2019 6812 Gross per 24 hour  Intake 394.63 ml  Output 1600 ml  Net -1205.37 ml   Last 3 Weights 07/04/2019 07/03/2019 07/02/2019  Weight (lbs) 140 lb 14 oz 150 lb 12.7 oz 137 lb 9.1 oz  Weight (kg) 63.9 kg 68.4 kg 62.4 kg      Telemetry    Atrial fibrillation  with V pacing- Personally Reviewed  ECG    No new EKG to review- Personally Reviewed  Physical Exam   GEN: Well nourished, well developed in no acute distress HEENT: Normal NECK: No JVD; No carotid bruits LYMPHATICS: No lymphadenopathy CARDIAC:RRR, no murmurs, rubs, gallops RESPIRATORY:  Clear to auscultation without rales, wheezing or rhonchi  ABDOMEN: Soft, non-tender, non-distended MUSCULOSKELETAL:  No edema; No deformity  SKIN: Warm and dry NEUROLOGIC:  Alert and oriented x 3 PSYCHIATRIC:  Normal affect    Labs    High Sensitivity Troponin:   Recent Labs  Lab 07/01/19 0933 07/01/19 1153  TROPONINIHS 9 2,277*      Chemistry Recent Labs  Lab 07/02/19 0500 07/03/19 0006 07/04/19 0802  NA 131* 130* 136  K 3.7 4.8 4.1  CL 98 99 101  CO2 25 24 27   GLUCOSE 79 99 93  BUN 18 18 14   CREATININE 0.83 0.77 0.78  CALCIUM 8.1* 8.1* 8.6*  GFRNONAA >60 >60 >60  GFRAA >60 >60 >60  ANIONGAP 8 7 8      Hematology Recent Labs  Lab 07/02/19 0500 07/03/19 0006 07/04/19 0802  WBC 9.0 7.2 5.7  RBC  3.34* 3.30* 3.57*  HGB 10.2* 10.3* 11.0*  HCT 31.1* 31.2* 33.6*  MCV 93.1 94.5 94.1  MCH 30.5 31.2 30.8  MCHC 32.8 33.0 32.7  RDW 14.7 14.6 14.6  PLT 168 170 198     Radiology    CARDIAC CATHETERIZATION  Result Date: 07/02/2019  Mid LAD lesion is 25% stenosed.  RPAV lesion is 99% stenosed. This is a thrombotic lesion at the very distal PLA.  The left ventricular systolic function is normal.  LV end diastolic pressure is normal.  The left ventricular ejection fraction is 55-65% by visual estimate.  There is no aortic valve stenosis.  I suspect that her INR dropped and she had an embolic event with thrombus ending up in her distal PLA.  It was too small for intervention at that part of the vessel.  Continue medical therapy.  The patient is currently pain-free. I encouraged her to consider using Eliquis or Xarelto, rather than Coumadin to reduce the likelihood of events  like this.  She states she is comfortable on Coumadin.  I spoke to her son Merry Proud.  He asked that the team encourage her to switch to either Eliquis or Xarelto.  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 The son also asked that we encourage her to get the Covid vaccine.    Cardiac Studies    Echo 06/23/19 1. Left ventricular ejection fraction, by estimation, is 50%. The left  ventricle has low normal function. The left ventricle demonstrates global  hypokinesis. There is mild left ventricular hypertrophy. Left ventricular  diastolic parameters are  indeterminate.  2. Right ventricular systolic function is normal. The right ventricular  size is normal. There is normal pulmonary artery systolic pressure. The  estimated right ventricular systolic pressure is 16.9 mmHg.  3. Left atrial size was moderately dilated.  4. The mitral valve is grossly normal with mild annular calcification.  Mild mitral valve regurgitation.  5. The aortic valve is tricuspid. Aortic valve regurgitation is not  visualized. Mild aortic valve sclerosis is present, with no evidence of  aortic valve stenosis.  6. The inferior vena cava is normal in size with greater than 50%  respiratory variability, suggesting right atrial pressure of 3 mmHg.   Cardiac Cath 06/2019 Conclusion    Mid LAD lesion is 25% stenosed.  RPAV lesion is 99% stenosed. This is a thrombotic lesion at the very distal PLA.  The left ventricular systolic function is normal.  LV end diastolic pressure is normal.  The left ventricular ejection fraction is 55-65% by visual estimate.  There is no aortic valve stenosis.   I suspect that her INR dropped and she had an embolic event with thrombus ending up in her distal PLA.  It was too small for intervention at that part of the vessel.  Continue medical therapy.  The patient is currently pain-free.  I encouraged her to  consider using Eliquis or Xarelto, rather than Coumadin to reduce the likelihood of events like this.  She states she is comfortable on Coumadin.  I spoke to her son Merry Proud.  He asked that the team encourage her to switch to either Eliquis or Xarelto.  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  The son also asked that we encourage her to get the Covid vaccine.   Patient Profile  79 y.o. female persistent atrial fibrillation, on Coumadin for anticoagulation, sick sinus syndrome status post permanent pacemaker, dilated cardiomyopathy with presumed nonischemic, Raynaud's, rheumatoid arthritis, COPD, hypertension, asthma, chronic back pain due to spinal stenosis and recent admission 6/8-6/10 CHF exacerbation with echocardiogram showing LV function of 50% presented to [facility] ER from [place] [facility] with substernal chest pressure.  She was found to have elevated troponin and sent to [facility] for cardiac catheterization.  Assessment & Plan    1.  Non-STEMI -High-sensitivity troponin 9>>2277. -cath  with 99% distal PLA too small for PCI and felt to likely be embolic from subtherapeutic INR -it was recommended that patient switch from coumadin to DOAC but she refuses -she denies any further CP or SOB -Continue aspirin, atenolol and statin  2.  Persistent atrial fibrillation -Rate control with intermittent V paced -Coumadin was restarted last night>>patient refuses to change to DOAC. -She has home INR machine and her Coumadin followed by PCP. -Anticoagulated with heparin currently until INR is therapuetic loading with Coumadin per phamacy -she says that she would consider Eliquis or Xarelto but wants information about it and also needs to know out of pocket cost. -restarted on Coumadin last night per pharmacy but awaiting more info on cost of DOAC before patient makes final decision on switching to DOAC>>pharmacy  and case management should know today -if she decides to go with coumadin will arrange Lovenox bridge  3. HLD - 07/02/2019: Cholesterol 119; HDL 51; LDL Cholesterol 60; Triglycerides 42; VLDL 8  - Continue Lipitor 40mg  qd  4.  Rheumatoid arthritis/synovitis -on Prednisone and Robaxin -Will give home PRN percocet instead Morphine   5. Chronic diastolic CHF -2D echo this admit with EF 50% -appears euvolemic on exam today -LVEDP 64mmHg at cath -Bumex was held  for soft BP and cath and restarted yesterday -she put out 1.6L yesterday and is net neg 1.4L -creatinine normal at 0.78 -continue Bumex  6. HTN -BP stable at 112/2mmHg -continue Atenolol 12.5mg  BID -continue to hold Losartan and followup outpt if BP stable  7.  PVCs -noted to have NSVT on tele a few days ago -continue Atenolol 12.5mg  BID  8.  Disposition -she was at [facility] SN[facility] for rehab -she tells me she is not going back there and needs to go to another rehab when she leaves -Case management has been consulted and await their recs  Patient has not gotten the COVID 19 vaccine.  SHe has a gelatin allergy.  She would like to talk with pharmacy about getting the COVID 19 vaccine.  I have spent a total of 35 minutes with patient reviewing cardiac cath, 2D echo , telemetry, EKGs, labs and examining patient as well as establishing an assessment and plan that was discussed with the patient.  > 50% of time was spent in direct patient care.    For questions or updates, please contact [place] Please consult [email or web address] for contact info under        Signed, Fransico Him, MD  07/04/2019, 9:06 AM

## 2019-07-04 NOTE — Evaluation (Signed)
Physical Therapy Evaluation Patient Details Name: Angela Wong MRN: [id number] DOB: 1940-09-20 Today's Date: 07/04/2019   History of Present Illness  Pt adm with NSTEMI. PMH - pacer, chf, copd, htn, asthma, Raynauds, RA, chronic pain, restless leg syndrome, ovarian CA, OA  Clinical Impression  Pt admitted with above diagnosis and presents to PT with functional limitations due to deficits listed below (See PT problem list). Pt needs skilled PT to maximize independence and safety to allow discharge back to SNF.      Follow Up Recommendations SNF    Equipment Recommendations  None recommended by PT    Recommendations for Other Services       Precautions / Restrictions Precautions Precautions: Fall      Mobility  Bed Mobility Overal bed mobility: Needs Assistance Bed Mobility: Supine to Sit;Sit to Supine     Supine to sit: Mod assist;HOB elevated Sit to supine: Min assist   General bed mobility comments: Assist to bring legs off of bed, elevate trunk into sitting and bring hips to EOB. Assist to bring legs back up into bed  Transfers Overall transfer level: Needs assistance Equipment used: 4-wheeled walker Transfers: Sit to/from Stand Sit to Stand: Mod assist Stand pivot transfers: Min assist       General transfer comment: Assist to bring hips up and for balance. Used hands on locked rollator to come up. Used rollator for pivotal steps bed to bsc  Ambulation/Gait Ambulation/Gait assistance: Herbalist (Feet): 3 Feet Assistive device: 4-wheeled walker Gait Pattern/deviations: Step-through pattern;Decreased step length - right;Decreased step length - left;Shuffle;Trunk flexed Gait velocity: decr Gait velocity interpretation: <1.31 ft/sec, indicative of household ambulator General Gait Details: Assist for balance and support. Amb from bsc up to [facility].   Stairs            Wheelchair Mobility    Modified Rankin (Stroke Patients Only)        Balance Overall balance assessment: Needs assistance Sitting-balance support: No upper extremity supported;Feet supported Sitting balance-Leahy Scale: Good     Standing balance support: Bilateral upper extremity supported Standing balance-Leahy Scale: Poor Standing balance comment: rollator and min gaurd for static standing                             Pertinent Vitals/Pain Pain Assessment: Faces Faces Pain Scale: Hurts little more Pain Location: BLE to touch Pain Descriptors / Indicators: Sore Pain Intervention(s): Limited activity within patient's tolerance;Repositioned    Home Living Family/patient expects to be discharged to:: Skilled nursing facility               Home Equipment: Gilford Rile - 4 wheels;Cane - single point;Walker - standard;Tub bench;Grab bars - tub/shower;Grab bars - toilet      Prior Function Level of Independence: Needs assistance   Gait / Transfers Assistance Needed: Prior to recent hospitalization pt was amb household distances with rollator. Since pt at SNF only performing transfers bed to chair etc with assist           Hand Dominance   Dominant Hand: Left    Extremity/Trunk Assessment   Upper Extremity Assessment Upper Extremity Assessment: Defer to OT evaluation    Lower Extremity Assessment Lower Extremity Assessment: Generalized weakness    Cervical / Trunk Assessment Cervical / Trunk Assessment: Kyphotic  Communication   Communication: No difficulties  Cognition Arousal/Alertness: Awake/alert Behavior During Therapy: WFL for tasks assessed/performed Overall Cognitive Status: Within Functional Limits for  tasks assessed                                 General Comments: Discusses medical issues in detail.      General Comments      Exercises     Assessment/Plan    PT Assessment Patient needs continued PT services  PT Problem List Decreased strength;Decreased activity tolerance;Decreased  balance;Decreased mobility       PT Treatment Interventions DME instruction;Gait training;Functional mobility training;Therapeutic activities;Therapeutic exercise;Balance training;Patient/family education    PT Goals (Current goals can be found in the Care Plan section)  Acute Rehab PT Goals Patient Stated Goal: rehab and then return home PT Goal Formulation: With patient Time For Goal Achievement: 07/18/19 Potential to Achieve Goals: Good    Frequency Min 2X/week   Barriers to discharge        Co-evaluation               AM-PAC PT "6 Clicks" Mobility  Outcome Measure Help needed turning from your back to your side while in a flat bed without using bedrails?: A Little Help needed moving from lying on your back to sitting on the side of a flat bed without using bedrails?: A Lot Help needed moving to and from a bed to a chair (including a wheelchair)?: A Lot Help needed standing up from a chair using your arms (e.g., wheelchair or bedside chair)?: A Lot Help needed to walk in hospital room?: A Lot Help needed climbing 3-5 steps with a railing? : Total 6 Click Score: 12    End of Session Equipment Utilized During Treatment: Gait belt Activity Tolerance: Patient tolerated treatment well Patient left: in bed;with call bell/phone within reach;with nursing/sitter in room;with bed alarm set Nurse Communication: Mobility status PT Visit Diagnosis: Unsteadiness on feet (R26.81);Muscle weakness (generalized) (M62.81)    Time: 2202-5427 PT Time Calculation (min) (ACUTE ONLY): 42 min   Charges:   PT Evaluation $PT Eval Moderate Complexity: 1 Mod PT Treatments $Therapeutic Activity: 23-37 mins        [place] Pager [phone] Office [place] 07/04/2019, 4:23 PM

## 2019-07-04 NOTE — Progress Notes (Signed)
[place] for Heparin & Coumadin Indication: chest pain/ACS  Allergies  Allergen Reactions  . Cephalosporins Hives and Shortness Of Breath  . Ciprofloxacin Hives and Shortness Of Breath  . Diltiazem Shortness Of Breath    Swollen throat   . Doxycycline Hives and Shortness Of Breath  . Horse-Derived Products Anaphylaxis  . Ketek [Telithromycin] Palpitations    Chest discomfort  . Nitrofuran Derivatives Anaphylaxis and Hives    blisters  . Nitrous Oxide Nausea And Vomiting    Severe due to Sjogrens (Auto-Immune Disease)  . Other Anaphylaxis    ALLERGY TO HORSE SERUM  . Penicillins Hives and Shortness Of Breath       . Pentazocine Other (See Comments)    Other reaction(s): Mental Status Changes (intolerance) Altered Mental Status  Altered Mental Status  Altered Mental Status   . Sulfa Antibiotics Hives and Shortness Of Breath  . Trovan [Alatrofloxacin] Palpitations, Other (See Comments) and Anaphylaxis    Chest pain, dizziness, irregular pulse  . Zinc Gelatin [Zinc] Anaphylaxis  . Amlodipine Swelling  . Calcium Channel Blockers     Respiratory distress  . Carvedilol Other (See Comments)    Dizziness, "joint pain, depression"  . Clindamycin/Lincomycin     CP, lock jaw  . Codeine Nausea Only  . Cymbalta [Duloxetine Hcl] Swelling  . Diovan [Valsartan] Swelling  . Lisinopril Swelling  . Sertraline Other (See Comments)    confusion  . Tramadol Nausea Only  . Loteprednol Etabonate Rash    Patient Measurements: Height: 5\' 4"  (162.6 cm) Weight: 63.9 kg (140 lb 14 oz) IBW/kg (Calculated) : 54.7 HEPARIN DW (KG): 62  Vital Signs: Temp: 98.6 F (37 C) (06/20 0918) Temp Source: Oral (06/20 0918) BP: 123/66 (06/20 0918) Pulse Rate: 60 (06/20 0918)  Labs: Recent Labs    07/01/19 1153 07/01/19 1330 07/01/19 2202 07/02/19 0500 07/03/19 0006 07/03/19 0006 07/03/19 1029 07/03/19 2019 07/04/19 0802  HGB  --   --    < > 10.2*  10.3*  --   --   --  11.0*  HCT  --   --   --  31.1* 31.2*  --   --   --  33.6*  PLT  --   --   --  168 170  --   --   --  198  LABPROT  --  17.8*  --   --  18.6*  --   --   --  28.4*  INR  --  1.5*  --   --  1.6*  --   --   --  2.8*  HEPARINUNFRC  --   --    < >  --  <0.10*   < > 0.22* 0.39 0.65  CREATININE  --   --   --  0.83 0.77  --   --   --  0.78  TROPONINIHS 2,277*  --   --   --   --   --   --   --   --    < > = values in this interval not displayed.    Estimated Creatinine Clearance: 49.2 mL/min (by C-G formula based on SCr of 0.78 mg/dL).   Assessment: 79 y.o. female with h/o Afib s/p cath for heparin. Underwent cath 6/18- found to have thrombotic lesion at very distal PLA thought to be embolic event, possibly due to subtherapeutic INR. Consider change to DOAC. Patient is resistant to change to DOAC d/t to fear of cost.  TOC consult placed to evaluate cost of apixaban and rivaroxaban.   On Coumadin PTA for Afib - PTA regimen is 2 mg daily except 4 mg MWF.  Heparin level this AM therapeutic at 0.65.  INR therapeutic at 2.8. Restarted on Coumadin 6/18. INR jumped 1.6>2.8 which reflects only one of the boosted doses; however, she received another boosted dose last night. I expect that her INR will increase further when checked tomorrow AM. For that reason, will hold coumadin dose tonight and dose based on INR tomorrow but expect we can likely resume her home PTA regimen.  CBC stable and no bleeding per nurse.   Goal of Therapy:  Heparin level 0.3-0.7 units/ml Monitor platelets by anticoagulation protocol: Yes   Plan:  Discontinue heparin infusion since INR therapeutic on Coumadin Hold Coumadin dose tonight given large jump in INR with boost doses Monitor daily INR and CBC while on Coumadin Watch for s/sx of bleeding Plan is for discharge in 1-2 days. Awaiting SNF placement.  Will discuss oral anticoagulant options with the patient when we have more info regarding cost of  DOACs.  Kennon Holter, PharmD PGY1 Ambulatory Care Pharmacy Resident

## 2019-07-04 NOTE — Care Management (Signed)
[id number] 07-04-19 Case Manager received consult for Eliquis 5 mg po BID and Xarelto 15 mg po daily. Benefits check submitted and Case Manager will follow for cost. CSW assisting with getting patient to a new skilled nursing facility. [name], RN,BSN Case Manager

## 2019-07-04 NOTE — NC FL2 (Signed)
[place] LEVEL OF CARE SCREENING TOOL     IDENTIFICATION  Patient Name: Angela Wong Birthdate: 1940/10/08 Sex: female Admission Date (Current Location): 07/01/2019  [facility] and [place] Number:  Herbalist and Address:  [facility]. [facility], [place] [address] [place], [place] [address]      Provider Number: [id number]  Attending Physician Name and Address:  Elouise Munroe, MD  Relative Name and Phone Number:  Merry Proud [phone]    Current Level of Care: Hospital Recommended Level of Care: [place] Prior Approval Number:    Date Approved/Denied:   PASRR Number: [id number]  Discharge Plan: SNF    Current Diagnoses: Patient Active Problem List   Diagnosis Date Noted  . Non-STEMI (non-ST elevated myocardial infarction) ([place]) 07/01/2019  . Chronic constipation 06/29/2019  . Hypertensive heart disease with heart failure ([place]) 06/29/2019  . Decompensated heart failure ([place]) 06/22/2019  . Pressure injury of skin 06/22/2019  . RA (rheumatoid arthritis) ([place]) 05/04/2019  . Anticoagulation goal of INR 2 to 3 12/18/2017  . Depression, major, single episode, moderate ([place]) 09/12/2017  . Chest pain 08/25/2017  . Generalized weakness 08/25/2017  . Seasonal and perennial allergic rhinitis 12/31/2016  . Allergic rhinitis with a nonallergic component 10/01/2016  . Itching 10/01/2016  . 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 Steroid-induced osteopenia 07/11/2016  . Hyperparathyroidism ([place]) 01/16/2016  . Iron deficiency anemia 12/18/2015  . Insomnia 12/18/2015  . Hypothyroidism 12/18/2015  . Rotator cuff tear 03/23/2014  . Osteoarthritis   . Moderate persistent asthma without complication   . Ovarian cancer ([place])   . Fibromyalgia   . RLS (restless legs syndrome)   . Cerebral vasculitis   . Sjogren's syndrome ([place])   . Raynaud's disease   . MVP (mitral valve prolapse)   . IBS (irritable bowel syndrome)   . Congestive  dilated cardiomyopathy ([place]) 02/09/2014  . Dyspnea 12/13/2013  . Hyperlipidemia 11/26/2013  . Malaise and fatigue 11/26/2013  . Allergy to multiple antibiotics 11/02/2013  . History of ovarian cancer 10/01/2013  . GERD (gastroesophageal reflux disease) 10/01/2013  . Benign essential HTN 10/01/2013  . Atrial fibrillation, permanent ([place]) 07/27/2013    Orientation RESPIRATION BLADDER Height & Weight     Self, Time, Situation, Place  Normal Continent, External catheter (External Urinary Catheter) Weight: 140 lb 14 oz (63.9 kg) Height:  5\' 4"  (162.6 cm)  BEHAVIORAL SYMPTOMS/MOOD NEUROLOGICAL BOWEL NUTRITION STATUS      Continent Diet (See Discharge Summary)  AMBULATORY STATUS COMMUNICATION OF NEEDS Skin   Limited Assist Verbally Skin abrasions, Other (Comment) (Appropriate for Ethnicity, Dry, Abrasion Hand Left Pressure Injury Sacrum Left Stage 2)                       Personal Care Assistance Level of Assistance  Bathing, Feeding, Dressing Bathing Assistance: Limited assistance Feeding assistance: Independent (able to feed self;Cardiac diet) Dressing Assistance: Limited assistance     Functional Limitations Info  Sight, Hearing, Speech Sight Info: Impaired Hearing Info: Adequate Speech Info: Adequate    SPECIAL CARE FACTORS FREQUENCY  PT (By licensed PT), OT (By licensed OT)     PT Frequency: 5x min weekly OT Frequency: 5x min weekly            Contractures Contractures Info: Not present    Additional Factors Info  Code Status, Allergies Code Status Info: FULL Allergies Info: Cephalosporins,Ciprofloxacin,Diltiazem,Doxycycline,Horse-derived Products,Ketek (telithromycin) ,Nitrofuran Derivatives,Nitrous Oxide,ALLERGY TO HORSE SERUM,Penicillins,Pentazocine,Sulfa Antibiotics,Trovan (alatrofloxacin)Zinc Gelatin,Amlodipine,Calcium  Channel Blockers,CarvedilolClindamycin/lincomycin,,Codeine,Cymbalta,Diovan ,Lisinopril,Sertraline,TramadolLoteprednol Etabonate,            Current Medications (07/04/2019):  This is the current hospital active medication list Current Facility-Administered Medications  Medication Dose Route Frequency Provider Last Rate Last Admin  . 0.9 %  sodium chloride infusion  250 mL Intravenous PRN Jettie Booze, MD      . acetaminophen (TYLENOL) tablet 650 mg  650 mg Oral Q4H PRN Jettie Booze, MD   650 mg at 07/02/19 0427  . acetaminophen (TYLENOL) tablet 650 mg  650 mg Oral Q4H PRN Jettie Booze, MD      . albuterol (PROVENTIL) (2.5 MG/3ML) 0.083% nebulizer solution 2.5 mg  2.5 mg Nebulization Q6H PRN Jettie Booze, MD      . aspirin EC tablet 81 mg  81 mg Oral Daily Jettie Booze, MD   81 mg at 07/04/19 2353  . atenolol (TENORMIN) tablet 12.5 mg  12.5 mg Oral BID Sueanne Margarita, MD   12.5 mg at 07/04/19 0918  . atorvastatin (LIPITOR) tablet 40 mg  40 mg Oral q1800 Jettie Booze, MD   40 mg at 07/03/19 1603  . bumetanide (BUMEX) tablet 0.5 mg  0.5 mg Oral Samule Ohm, Traci R, MD   0.5 mg at 07/04/19 6144  . bumetanide (BUMEX) tablet 1 mg  1 mg Oral Orene Desanctis, MD   1 mg at 07/03/19 1201  . cycloSPORINE (RESTASIS) 0.05 % ophthalmic emulsion 1 drop  1 drop Both Eyes BID Jettie Booze, MD   1 drop at 07/04/19 3154  . docusate sodium (COLACE) capsule 400 mg  400 mg Oral q morning - 10a Jettie Booze, MD   400 mg at 07/04/19 0086  . fluticasone furoate-vilanterol (BREO ELLIPTA) 200-25 MCG/INH 1 puff  1 puff Inhalation Daily Jettie Booze, MD      . levothyroxine (SYNTHROID) tablet 100 mcg  100 mcg Oral Q0600 Jettie Booze, MD   100 mcg at 07/04/19 0601  . loratadine (CLARITIN) tablet 10 mg  10 mg Oral Daily Jettie Booze, MD   10 mg at 07/04/19 7619  . magnesium oxide (MAG-OX) tablet 400 mg  400 mg Oral QPM Jettie Booze, MD   400 mg at 07/03/19 1604  . methocarbamol (ROBAXIN) tablet 500 mg  500 mg Oral TID Jettie Booze, MD   500 mg at  07/04/19 5093  . morphine 2 MG/ML injection 2 mg  2 mg Intravenous Q3H PRN Jettie Booze, MD   2 mg at 07/03/19 1417  . nitroGLYCERIN (NITROSTAT) SL tablet 0.4 mg  0.4 mg Sublingual Q5 Min x 3 PRN Larae Grooms S, MD      . nitroGLYCERIN 50 mg in dextrose 5 % 250 mL (0.2 mg/mL) infusion  0-200 mcg/min Intravenous Titrated Jettie Booze, MD   Stopped at 07/02/19 0935  . ondansetron (ZOFRAN) injection 4 mg  4 mg Intravenous Q6H PRN Jettie Booze, MD   4 mg at 07/01/19 1843  . ondansetron (ZOFRAN) injection 4 mg  4 mg Intravenous Q6H PRN Jettie Booze, MD      . oxyCODONE-acetaminophen (PERCOCET/ROXICET) 5-325 MG per tablet 1 tablet  1 tablet Oral Q6H PRN Jettie Booze, MD   1 tablet at 07/04/19 [phone]  . pantoprazole (PROTONIX) EC tablet 40 mg  40 mg Oral Daily Jettie Booze, MD   40 mg at 07/04/19 2458  . predniSONE (DELTASONE) tablet 5 mg  5 mg Oral q morning - 10a Jettie Booze, MD   5 mg at 07/04/19 6945  . simethicone (MYLICON) chewable tablet 80 mg  80 mg Oral QID PRN Milus Banister, MD   80 mg at 07/03/19 2104  . sodium chloride flush (NS) 0.9 % injection 3 mL  3 mL Intravenous Q12H Jettie Booze, MD   3 mL at 07/03/19 2106  . sodium chloride flush (NS) 0.9 % injection 3 mL  3 mL Intravenous Q12H Jettie Booze, MD   3 mL at 07/03/19 2105  . sodium chloride flush (NS) 0.9 % injection 3 mL  3 mL Intravenous PRN Jettie Booze, MD      . traZODone (DESYREL) tablet 150 mg  150 mg Oral QHS Jettie Booze, MD   150 mg at 07/03/19 2101  . Warfarin - Pharmacist Dosing Inpatient   Does not apply q1600 Troy Sine, MD   Given at 07/03/19 1735     Discharge Medications: Please see discharge summary for a list of discharge medications.  Relevant Imaging Results:  Relevant Lab Results:   Additional Information [id number]  Trula Ore, LCSWA

## 2019-07-05 ENCOUNTER — Encounter (HOSPITAL_COMMUNITY): Payer: Self-pay | Admitting: Interventional Cardiology

## 2019-07-05 DIAGNOSIS — Z7401 Bed confinement status: Secondary | ICD-10-CM | POA: Diagnosis not present

## 2019-07-05 DIAGNOSIS — R791 Abnormal coagulation profile: Secondary | ICD-10-CM

## 2019-07-05 DIAGNOSIS — E039 Hypothyroidism, unspecified: Secondary | ICD-10-CM | POA: Diagnosis not present

## 2019-07-05 DIAGNOSIS — Z950 Presence of cardiac pacemaker: Secondary | ICD-10-CM | POA: Diagnosis not present

## 2019-07-05 DIAGNOSIS — R278 Other lack of coordination: Secondary | ICD-10-CM | POA: Diagnosis not present

## 2019-07-05 DIAGNOSIS — F411 Generalized anxiety disorder: Secondary | ICD-10-CM | POA: Diagnosis not present

## 2019-07-05 DIAGNOSIS — M2550 Pain in unspecified joint: Secondary | ICD-10-CM | POA: Diagnosis not present

## 2019-07-05 DIAGNOSIS — M069 Rheumatoid arthritis, unspecified: Secondary | ICD-10-CM | POA: Diagnosis not present

## 2019-07-05 DIAGNOSIS — K219 Gastro-esophageal reflux disease without esophagitis: Secondary | ICD-10-CM | POA: Diagnosis not present

## 2019-07-05 DIAGNOSIS — R2689 Other abnormalities of gait and mobility: Secondary | ICD-10-CM | POA: Diagnosis not present

## 2019-07-05 DIAGNOSIS — I4819 Other persistent atrial fibrillation: Secondary | ICD-10-CM | POA: Diagnosis not present

## 2019-07-05 DIAGNOSIS — E785 Hyperlipidemia, unspecified: Secondary | ICD-10-CM | POA: Diagnosis not present

## 2019-07-05 DIAGNOSIS — R531 Weakness: Secondary | ICD-10-CM | POA: Diagnosis not present

## 2019-07-05 DIAGNOSIS — E213 Hyperparathyroidism, unspecified: Secondary | ICD-10-CM | POA: Diagnosis not present

## 2019-07-05 DIAGNOSIS — M6281 Muscle weakness (generalized): Secondary | ICD-10-CM | POA: Diagnosis not present

## 2019-07-05 DIAGNOSIS — F4320 Adjustment disorder, unspecified: Secondary | ICD-10-CM | POA: Diagnosis not present

## 2019-07-05 DIAGNOSIS — I25119 Atherosclerotic heart disease of native coronary artery with unspecified angina pectoris: Secondary | ICD-10-CM | POA: Diagnosis not present

## 2019-07-05 DIAGNOSIS — M0579 Rheumatoid arthritis with rheumatoid factor of multiple sites without organ or systems involvement: Secondary | ICD-10-CM | POA: Diagnosis not present

## 2019-07-05 DIAGNOSIS — F329 Major depressive disorder, single episode, unspecified: Secondary | ICD-10-CM | POA: Diagnosis not present

## 2019-07-05 DIAGNOSIS — I4821 Permanent atrial fibrillation: Secondary | ICD-10-CM | POA: Diagnosis not present

## 2019-07-05 DIAGNOSIS — I214 Non-ST elevation (NSTEMI) myocardial infarction: Secondary | ICD-10-CM | POA: Diagnosis not present

## 2019-07-05 DIAGNOSIS — G8929 Other chronic pain: Secondary | ICD-10-CM | POA: Diagnosis not present

## 2019-07-05 DIAGNOSIS — Z789 Other specified health status: Secondary | ICD-10-CM | POA: Diagnosis not present

## 2019-07-05 DIAGNOSIS — R2681 Unsteadiness on feet: Secondary | ICD-10-CM | POA: Diagnosis not present

## 2019-07-05 DIAGNOSIS — K5900 Constipation, unspecified: Secondary | ICD-10-CM | POA: Diagnosis not present

## 2019-07-05 DIAGNOSIS — I2510 Atherosclerotic heart disease of native coronary artery without angina pectoris: Secondary | ICD-10-CM

## 2019-07-05 DIAGNOSIS — M3500 Sicca syndrome, unspecified: Secondary | ICD-10-CM | POA: Diagnosis not present

## 2019-07-05 DIAGNOSIS — I5033 Acute on chronic diastolic (congestive) heart failure: Secondary | ICD-10-CM | POA: Diagnosis not present

## 2019-07-05 DIAGNOSIS — I110 Hypertensive heart disease with heart failure: Secondary | ICD-10-CM | POA: Diagnosis not present

## 2019-07-05 DIAGNOSIS — Z7409 Other reduced mobility: Secondary | ICD-10-CM | POA: Diagnosis not present

## 2019-07-05 DIAGNOSIS — I420 Dilated cardiomyopathy: Secondary | ICD-10-CM | POA: Diagnosis not present

## 2019-07-05 DIAGNOSIS — I5032 Chronic diastolic (congestive) heart failure: Secondary | ICD-10-CM | POA: Diagnosis not present

## 2019-07-05 DIAGNOSIS — I4811 Longstanding persistent atrial fibrillation: Secondary | ICD-10-CM | POA: Diagnosis not present

## 2019-07-05 DIAGNOSIS — J449 Chronic obstructive pulmonary disease, unspecified: Secondary | ICD-10-CM | POA: Diagnosis not present

## 2019-07-05 DIAGNOSIS — I7300 Raynaud's syndrome without gangrene: Secondary | ICD-10-CM | POA: Diagnosis not present

## 2019-07-05 LAB — CBC
HCT: 35 % — ABNORMAL LOW (ref 36.0–46.0)
Hemoglobin: 11.5 g/dL — ABNORMAL LOW (ref 12.0–15.0)
MCH: 31.1 pg (ref 26.0–34.0)
MCHC: 32.9 g/dL (ref 30.0–36.0)
MCV: 94.6 fL (ref 80.0–100.0)
Platelets: 204 10*3/uL (ref 150–400)
RBC: 3.7 MIL/uL — ABNORMAL LOW (ref 3.87–5.11)
RDW: 14.5 % (ref 11.5–15.5)
WBC: 4.7 10*3/uL (ref 4.0–10.5)
nRBC: 0 % (ref 0.0–0.2)

## 2019-07-05 LAB — BASIC METABOLIC PANEL
Anion gap: 7 (ref 5–15)
BUN: 11 mg/dL (ref 8–23)
CO2: 28 mmol/L (ref 22–32)
Calcium: 8.9 mg/dL (ref 8.9–10.3)
Chloride: 104 mmol/L (ref 98–111)
Creatinine, Ser: 0.78 mg/dL (ref 0.44–1.00)
GFR calc Af Amer: >60 mL/min (ref >60)
GFR calc non Af Amer: >60 mL/min (ref >60)
Glucose, Bld: 83 mg/dL (ref 70–99)
Potassium: 4.1 mmol/L (ref 3.5–5.1)
Sodium: 139 mmol/L (ref 135–145)

## 2019-07-05 LAB — SARS CORONAVIRUS 2 BY RT PCR (HOSPITAL ORDER, PERFORMED IN ~~LOC~~ HOSPITAL LAB): SARS Coronavirus 2: NEGATIVE

## 2019-07-05 LAB — PROTIME-INR
INR: 2.9 — ABNORMAL HIGH (ref 0.8–1.2)
Prothrombin Time: 29.1 seconds — ABNORMAL HIGH (ref 11.4–15.2)

## 2019-07-05 MED ORDER — OXYCODONE-ACETAMINOPHEN 5-325 MG PO TABS: 1.0000 | ORAL_TABLET | Freq: Four times a day (QID) | ORAL | 0 refills | Status: AC | PRN

## 2019-07-05 MED ORDER — WARFARIN SODIUM 2 MG PO TABS: 2.0000 mg | ORAL_TABLET | Freq: Once | ORAL | 0 refills | Status: DC

## 2019-07-05 MED ORDER — ATENOLOL 25 MG PO TABS: 12.5000 mg | ORAL_TABLET | Freq: Two times a day (BID) | ORAL | 5 refills | Status: DC

## 2019-07-05 MED ORDER — ATORVASTATIN CALCIUM 40 MG PO TABS: 40.0000 mg | ORAL_TABLET | Freq: Every day | ORAL | 4 refills | Status: DC

## 2019-07-05 MED ORDER — WARFARIN SODIUM 2 MG PO TABS
2.0000 mg | ORAL_TABLET | Freq: Once | ORAL | Status: AC
  Administered 2019-07-05: 2 mg via ORAL
  Filled 2019-07-05: qty 1

## 2019-07-05 MED ORDER — WARFARIN SODIUM 1 MG PO TABS: 1.0000 mg | ORAL_TABLET | Freq: Every day | ORAL | 3 refills | Status: DC

## 2019-07-05 MED ORDER — ASPIRIN 81 MG PO TBEC: 81.0000 mg | DELAYED_RELEASE_TABLET | Freq: Every day | ORAL | 11 refills | Status: DC

## 2019-07-05 NOTE — TOC Benefit Eligibility Note (Signed)
Transition of Care [facility]) Benefit Eligibility Note    Patient Details  Name: Angela Wong MRN: [id number] Date of Birth: 01/01/1941   Medication/Dose: Eliquis  5mg  bid and Xarelto 20mg  qd  Covered?: Yes  Tier: 3 Drug  Prescription Coverage Preferred Pharmacy: Diannia Ruder with Person/Company/Phone Number:: [facility]  Co-Pay: Eliquis: $47 for 30 day retail/ Xarelto $47 for 30 day retail  Prior Approval: No          Delorse Lek Phone Number: 07/05/2019, 12:05 PM

## 2019-07-05 NOTE — Discharge Summary (Signed)
Discharge Summary    Patient ID: Angela Wong MRN: [id number]; DOB: [birth year]  Admit date: 07/01/2019 Discharge date: 07/05/2019  Primary Care Provider: [name], FNP  Primary Cardiologist: Minus Breeding, MD  Primary Electrophysiologist:  None   Discharge Diagnoses    Principal Problem:   Non-STEMI (non-ST elevated myocardial infarction) [facility]) Active Problems:   Atrial fibrillation, permanent ([place])   Hypertension   Hyperlipidemia   Congestive dilated cardiomyopathy ([place])   Sjogren's syndrome ([place])   Raynaud's disease   Chest pain   RA (rheumatoid arthritis) ([place])   Subtherapeutic international normalized ratio (INR)   CAD (coronary artery disease)   Diagnostic Studies/Procedures   Echo 06/23/19 1. Left ventricular ejection fraction, by estimation, is 50%. The left  ventricle has low normal function. The left ventricle demonstrates global  hypokinesis. There is mild left ventricular hypertrophy. Left ventricular  diastolic parameters are  indeterminate.  2. Right ventricular systolic function is normal. The right ventricular  size is normal. There is normal pulmonary artery systolic pressure. The  estimated right ventricular systolic pressure is 25.3 mmHg.  3. Left atrial size was moderately dilated.  4. The mitral valve is grossly normal with mild annular calcification.  Mild mitral valve regurgitation.  5. The aortic valve is tricuspid. Aortic valve regurgitation is not  visualized. Mild aortic valve sclerosis is present, with no evidence of  aortic valve stenosis.  6. The inferior vena cava is normal in size with greater than 50%  respiratory variability, suggesting right atrial pressure of 3 mmHg.   Cardiac Cath 06/2019 Conclusion    Mid LAD lesion is 25% stenosed.  RPAV lesion is 99% stenosed. This is a thrombotic lesion at the very distal PLA.  The left ventricular systolic function is normal.  LV end diastolic pressure is  normal.  The left ventricular ejection fraction is 55-65% by visual estimate.  There is no aortic valve stenosis.  I suspect that her INR dropped and she had an embolic event with thrombus ending up in her distal PLA. It was too small for intervention at that part of the vessel. Continue medical therapy. The patient is currently pain-free.  I encouraged her to consider using Eliquis or Xarelto, rather than Coumadin to reduce the likelihood of events like this. She states she is comfortable on Coumadin. I spoke to her son Angela Wong. He asked that the team encourage her to switch to either Eliquis or Xarelto. 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  The son also asked that we encourage her to get the Covid vaccine.  Coronary Diagrams  Diagnostic Dominance: Right     _____________   History of Present Illness     Angela Wong is a 79 y.o. female with pmh of persistent atrial fibrillation on coumadin, SSS s/p PPM, dilated CM (presumed nonischemic), Raynaud's, rheumatoid arthritis, COPD, HTN, asthma and chronic back pain due to spinal stenosis. She was recently admitted 6/8-6/10 CHF exacerbation with echo showing LV function of 50% and was discharged to SNF.  She presented to [facility] ER 07/01/19 with substernal chest pressure. EKG showed afib with intermittent v-pacing and troponin elevated to 2277. CTA negative for dissection. INR was 1.5 and she was started on IV heparin and IV nitro. She normally checks her own INR and PCP manages coumadin. Patient was transferred to [facility] for cardiac cath.   Hospital Course     Consultants:  Case management/social work  On admission to [facility] her home atenolol and Lipitor were continued. She was still having 3/10 CP on IV nitro. Aspirin started. Home losartan held on admission for soft BPs. Cardiac cath showed mid LAD 25% stenosis, RPAV lesion 99% stenosis, normal LVEDP, no AI. It  was suspected INR dropped and she had an embolic event with thrombus ending up in her distal PLA. This was too small for PCI. Plan for medical therapy. Switching to Eliquis or Xarelto was discussed since this would lower the likely hood of events, but the patient said she was comfortable with coumadin. Apparently her son had been encouraging switching to [place] prior to admission but patient was previously a Marine scientist and reluctant to switch. She reported 1 subtherapeutic INR in the last 3-4 years.  Patient was willing to read information about both Xarelto and Eliquis and therefore case management was consulted. She had previously been at [facility] for rehab and was requesting a different SNF placement and social work was consulted. Pharmacy spoke to patient regarding Eliquis. She was restarted back on coumadin.The cath site remained stable with no complications. Atenolol was increased to 12.5mg  BID for frequent PVCs. Home Bumex was restarted. Patient remained stable with no further chest pain. Given therapeutic INR there was no compelling reason to switch to DOAC so it was decided that the patient would continue with coumadin. The patient was approved for SNF at [facility] and discharge papers were prepared.  Plan to discharge patient on Aspirin, Lipitor, and atenolol 12.5 mg BID. All other home medications were continued. Follow-up was arranged.   The patient was evaluated on 07/05/19 by Dr. Sallyanne Kuster and felt to be stable for discharge.     Did the patient have an acute coronary syndrome (MI, NSTEMI, STEMI, etc) this admission?:  Yes                               AHA/ACC Clinical Performance & Quality Measures: 1. Aspirin prescribed? - Yes 2. ADP Receptor Inhibitor (Plavix/Clopidogrel, Brilinta/Ticagrelor or Effient/Prasugrel) prescribed (includes medically managed patients)? - No - on coumadin 3. Beta Blocker prescribed? - Yes 4. High Intensity Statin (Lipitor 40-80mg  or Crestor 20-40mg ) prescribed? - Yes 5. EF  assessed during THIS hospitalization? - Yes 6. For EF <40%, was ACEI/ARB prescribed? - Not Applicable (EF >/= 15%) 7. For EF <40%, Aldosterone Antagonist (Spironolactone or Eplerenone) prescribed? - Not Applicable (EF >/= 17%) 8. Cardiac Rehab Phase II ordered (including medically managed patients)? - Going to SNF for rehab.   _____________  Discharge Vitals Blood pressure 112/63, pulse (!) 59, temperature 98 F (36.7 C), temperature source Oral, resp. rate 12, height 5\' 4"  (1.626 m), weight 64 kg, SpO2 97 %.  Filed Weights   07/03/19 0513 07/04/19 0559 07/05/19 0420  Weight: 68.4 kg 63.9 kg 64 kg    Labs & Radiologic Studies    CBC Recent Labs    07/04/19 0802 07/05/19 0457  WBC 5.7 4.7  HGB 11.0* 11.5*  HCT 33.6* 35.0*  MCV 94.1 94.6  PLT 198 616   Basic Metabolic Panel Recent Labs    07/04/19 0802 07/05/19 0457  NA 136 139  K 4.1 4.1  CL 101 104  CO2 27 28  GLUCOSE 93 83  BUN 14 11  CREATININE 0.78 0.78  CALCIUM 8.6* 8.9   Liver Function Tests No results for input(s): AST, ALT, ALKPHOS, BILITOT, PROT, ALBUMIN in the last 72 hours.  No results for input(s): LIPASE, AMYLASE in the last 72 hours. High Sensitivity Troponin:   Recent Labs  Lab 07/01/19 0933 07/01/19 1153  TROPONINIHS 9 2,277*    BNP Invalid input(s): POCBNP D-Dimer No results for input(s): DDIMER in the last 72 hours. Hemoglobin A1C No results for input(s): HGBA1C in the last 72 hours. Fasting Lipid Panel No results for input(s): CHOL, HDL, LDLCALC, TRIG, CHOLHDL, LDLDIRECT in the last 72 hours. Thyroid Function Tests No results for input(s): TSH, T4TOTAL, T3FREE, THYROIDAB in the last 72 hours.  Invalid input(s): FREET3 _____________  CARDIAC CATHETERIZATION  Result Date: 07/05/2019  Mid LAD lesion is 25% stenosed.  RPAV lesion is 99% stenosed. This is a thrombotic lesion at the very distal PLA.  The left ventricular systolic function is normal.  LV end diastolic pressure is  normal.  The left ventricular ejection fraction is 55-65% by visual estimate.  There is no aortic valve stenosis.  I suspect that her INR dropped and she had an embolic event with thrombus ending up in her distal PLA.  It was too small for intervention at that part of the vessel.  Continue medical therapy.  The patient is currently pain-free. I encouraged her to consider using Eliquis or Xarelto, rather than Coumadin to reduce the likelihood of events like this.  She states she is comfortable on Coumadin.  I spoke to her son Angela Wong.  He asked that the team encourage her to switch to either Eliquis or Xarelto.  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 The son also asked that we encourage her to get the Covid vaccine.   DG Chest Portable 1 View  Result Date: 07/01/2019 CLINICAL DATA:  Chest pain. EXAM: PORTABLE CHEST 1 VIEW COMPARISON:  06/22/2019 FINDINGS: Chronic mild cardiomegaly. Pulmonary vascularity is normal. Pacemaker in place. No infiltrates or effusions. No acute bone abnormality. Chronic degenerative changes of both shoulders. IMPRESSION: No acute cardiopulmonary disease. Chronic mild cardiomegaly.  The Electronically Signed   By: Lorriane Shire M.D.   On: 07/01/2019 10:01   DG Chest Portable 1 View  Result Date: 06/22/2019 CLINICAL DATA:  Shortness of breath and lower extremity swelling for the past few weeks. EXAM: PORTABLE CHEST 1 VIEW COMPARISON:  PA and lateral chest 03/19/2018 and CT chest 12/24/2017. FINDINGS: Pacing device is unchanged. Lungs are clear. Heart size is enlarged. No pneumothorax or pleural effusion. No acute bony abnormality. IMPRESSION: Cardiomegaly without acute disease. Electronically Signed   By: Inge Rise M.D.   On: 06/22/2019 14:57   ECHOCARDIOGRAM COMPLETE  Result Date: 06/23/2019    ECHOCARDIOGRAM REPORT   Patient Name:   HOLLEE FATE Date of Exam: 06/23/2019 Medical Rec #:  [id number]        Height:       64.0 in Accession #:    [id number]      Weight:       130.9 lb Date of Birth:  Mar 24, 1940      BSA:          1.634 m Patient Age:    1 years        BP:           115/69 mmHg Patient Gender: F               HR:           63 bpm. Exam Location:  [facility] Procedure: 2D  Echo Indications:    Congestive Heart Failure 428.0 / I50.9  History:        Patient has prior history of Echocardiogram examinations, most                 recent 08/26/2017. Arrythmias:Atrial Fibrillation,                 Signs/Symptoms:Dyspnea; Risk Factors:Hypertension and                 Dyslipidemia. MVP (mitral valve prolapse, GERD, History of                 ovarian cancer, RA (rheumatoid arthritis.  Sonographer:    Leavy Cella RDCS (AE) Referring Phys: [place]  1. Left ventricular ejection fraction, by estimation, is 50%. The left ventricle has low normal function. The left ventricle demonstrates global hypokinesis. There is mild left ventricular hypertrophy. Left ventricular diastolic parameters are indeterminate.  2. Right ventricular systolic function is normal. The right ventricular size is normal. There is normal pulmonary artery systolic pressure. The estimated right ventricular systolic pressure is 73.5 mmHg.  3. Left atrial size was moderately dilated.  4. The mitral valve is grossly normal with mild annular calcification. Mild mitral valve regurgitation.  5. The aortic valve is tricuspid. Aortic valve regurgitation is not visualized. Mild aortic valve sclerosis is present, with no evidence of aortic valve stenosis.  6. The inferior vena cava is normal in size with greater than 50% respiratory variability, suggesting right atrial pressure of 3 mmHg. FINDINGS  Left Ventricle: Left ventricular ejection fraction, by estimation, is 50%. The left ventricle has low normal function. The left ventricle demonstrates global hypokinesis. The left ventricular internal cavity size was normal in size.  There is mild left ventricular hypertrophy. Left ventricular diastolic parameters are indeterminate. Right Ventricle: The right ventricular size is normal. No increase in right ventricular wall thickness. Right ventricular systolic function is normal. There is normal pulmonary artery systolic pressure. The tricuspid regurgitant velocity is 1.83 m/s, and  with an assumed right atrial pressure of 3 mmHg, the estimated right ventricular systolic pressure is 32.9 mmHg. Left Atrium: Left atrial size was moderately dilated. Right Atrium: Right atrial size was normal in size. Pericardium: There is no evidence of pericardial effusion. Presence of pericardial fat pad. Mitral Valve: The mitral valve is grossly normal. Mild mitral annular calcification. Mild mitral valve regurgitation. Tricuspid Valve: The tricuspid valve is grossly normal. Tricuspid valve regurgitation is trivial. Aortic Valve: The aortic valve is tricuspid. Aortic valve regurgitation is not visualized. Mild aortic valve sclerosis is present, with no evidence of aortic valve stenosis. Mild aortic valve annular calcification. Pulmonic Valve: The pulmonic valve was grossly normal. Pulmonic valve regurgitation is trivial. Aorta: The aortic root is normal in size and structure. Venous: The inferior vena cava is normal in size with greater than 50% respiratory variability, suggesting right atrial pressure of 3 mmHg. IAS/Shunts: No atrial level shunt detected by color flow Doppler.  LEFT VENTRICLE PLAX 2D LVIDd:         5.06 cm  Diastology LVIDs:         3.76 cm  LV e' lateral:   9.32 cm/s LV PW:         1.22 cm  LV E/e' lateral: 10.4 LV IVS:        0.94 cm  LV e' medial:    6.49 cm/s LVOT diam:     2.00 cm  LV E/e'  medial:  14.9 LVOT Area:     3.14 cm  RIGHT VENTRICLE RV S prime:     10.40 cm/s TAPSE (M-mode): 1.6 cm LEFT ATRIUM             Index       RIGHT ATRIUM           Index LA diam:        4.60 cm 2.82 cm/m  RA Area:     20.70 cm LA Vol (A2C):   68.3 ml  41.80 ml/m RA Volume:   59.90 ml  36.66 ml/m LA Vol (A4C):   75.3 ml 46.09 ml/m LA Biplane Vol: 71.6 ml 43.82 ml/m   AORTA Ao Root diam: 2.70 cm MITRAL VALVE               TRICUSPID VALVE MV Area (PHT): 3.48 cm    TR Peak grad:   13.4 mmHg MV Decel Time: 218 msec    TR Vmax:        183.00 cm/s MR Peak grad: 28.9 mmHg MR Vmax:      269.00 cm/s  SHUNTS MV E velocity: 97.00 cm/s  Systemic Diam: 2.00 cm MV A velocity: 23.80 cm/s MV E/A ratio:  4.08 Rozann Lesches MD Electronically signed by Rozann Lesches MD Signature Date/Time: 06/23/2019/12:11:59 PM    Final    CT Angio Chest/Abd/Pel for Dissection W and/or W/WO  Result Date: 07/01/2019 CLINICAL DATA:  Chest pain.  Chronic low back pain EXAM: CT ANGIOGRAPHY CHEST, ABDOMEN AND PELVIS TECHNIQUE: Non-contrast CT of the chest was initially obtained. Multidetector CT imaging through the chest, abdomen and pelvis was performed using the standard protocol during bolus administration of intravenous contrast. Multiplanar reconstructed images and MIPs were obtained and reviewed to evaluate the vascular anatomy. CONTRAST:  129mL OMNIPAQUE IOHEXOL 350 MG/ML SOLN COMPARISON:  12/24/2017 FINDINGS: CTA CHEST FINDINGS Cardiovascular: Preferential opacification of the thoracic aorta. No evidence of thoracic aortic aneurysm or dissection. Three vessel arch. Stable cardiomegaly. No pericardial effusion. Scattered coronary artery calcifications. Right-sided implanted cardiac device with single lead terminating in the right ventricle. Mediastinum/Nodes: Redemonstration of multiple mildly enlarged mediastinal lymph nodes including 13 mm pretracheal node (series 7, image 37), previously measured 12 mm. 9 mm precarinal node (series 7, image 44), previously measured 6 mm. Borderline enlarged right hilar lymph node. No axillary or left hilar lymphadenopathy. Unremarkable thyroid, trachea, and esophagus. Lungs/Pleura: Trace bilateral pleural effusions with associated compressive  atelectasis. Additional bandlike areas of chronic scarring or atelectasis within the bilateral lung bases. No pneumothorax. Musculoskeletal: Chronic mild superior endplate compression deformity of T12 is unchanged. No new or acute osseous findings within the thorax. High-riding left humeral head. Chronic fracture/fragmentation of the posterior left glenoid (series 6, image 13). Review of the MIP images confirms the above findings. CTA ABDOMEN AND PELVIS FINDINGS VASCULAR Aorta: Normal caliber aorta without aneurysm, dissection, vasculitis or significant stenosis. Celiac: Patent without evidence of aneurysm, dissection, vasculitis or significant stenosis. Separate origin of the splenic and common hepatic arteries. SMA: Patent without evidence of aneurysm, dissection, vasculitis or significant stenosis. Renals: Both renal arteries are patent without evidence of aneurysm, dissection, vasculitis, fibromuscular dysplasia or significant stenosis. Single renal artery bilaterally. IMA: Patent. Inflow: Patent without evidence of aneurysm, dissection, vasculitis or significant stenosis. Veins: No obvious venous abnormality within the limitations of this arterial phase study. Review of the MIP images confirms the above findings. NON-VASCULAR Hepatobiliary: Stable 1.0 cm low-density lesion within the anterior aspect of the right hepatic  lobe. Liver is otherwise unremarkable. Gallbladder is mildly distended. No hyperdense gallstone or evidence of pericholecystic inflammation. Pancreas: Mildly atrophic. No pancreatic ductal dilatation or surrounding inflammatory changes. Spleen: Normal in size without focal abnormality. Adrenals/Urinary Tract: Unremarkable adrenal glands. Transverse lie of the right kidney with stable slightly hyperdense 1.8 cm right renal cyst (series 7, image 138). No renal stone or hydronephrosis. Urinary bladder is unremarkable. Stomach/Bowel: Stomach is within normal limits. Appendix not definitively  visualized. Extensive colonic diverticulosis. No evidence of bowel wall thickening, distention, or inflammatory changes. Lymphatic: No abdominopelvic lymphadenopathy. Reproductive: Status post hysterectomy. No adnexal masses. Other: No free air.  No free fluid.  No abdominal wall hernia. Musculoskeletal: Severe osteoarthritis of the bilateral hips, which has progressed on the right. No acute osseous abnormality. Multilevel lumbar spondylosis with evidence of high-grade canal stenosis at L3-4 and L4-5. Review of the MIP images confirms the above findings. IMPRESSION: 1. No evidence of thoracic or abdominal aortic aneurysm or dissection. 2. Trace bilateral pleural effusions with associated compressive atelectasis. 3. Mildly enlarged mediastinal lymph nodes, which have slightly progressed in size from prior CT 12/24/2017. Findings are nonspecific and may be reactive. 4. Extensive colonic diverticulosis without evidence of acute diverticulitis. 5. Severe osteoarthritis of the bilateral hips, which has progressed on the right. 6. Multilevel lumbar spondylosis with evidence of high-grade canal stenosis at L3-4 and L4-5. Electronically Signed   By: Davina Poke D.O.   On: 07/01/2019 12:24   Disposition   Pt is being discharged home today in good condition.  Follow-up Plans & Appointments     Contact information for follow-up providers    Erlene Quan, PA-C Follow up on 07/22/2019.   Specialties: Cardiology, Radiology Why: @ 2:45PM Contact information: [address] [place] [address] [phone]            Contact information for after-discharge care    Destination    [facility] .   Service: Skilled Nursing Contact information: [address] [place] [phone]                 Discharge Instructions    Diet - low sodium heart healthy   Complete by: As directed    Discharge instructions   Complete  by: As directed    No driving for 1 week. No lifting over 10 lbs for 2 weeks. Keep procedure site clean & dry. If you notice increased pain, swelling, bleeding or pus, call/return!  You may shower, but no soaking baths/hot tubs/pools for 1 week.   Increase activity slowly   Complete by: As directed    No wound care   Complete by: As directed       Discharge Medications   Allergies as of 07/05/2019      Reactions   Cephalosporins Hives, Shortness Of Breath   Ciprofloxacin Hives, Shortness Of Breath   Diltiazem Shortness Of Breath   Swollen throat   Doxycycline Hives, Shortness Of Breath   Horse-derived Products Anaphylaxis   Ketek [telithromycin] Palpitations   Chest discomfort   Nitrofuran Derivatives Anaphylaxis, Hives   blisters   Nitrous Oxide Nausea And Vomiting   Severe due to Sjogrens (Auto-Immune Disease)   Other Anaphylaxis   ALLERGY TO HORSE SERUM   Penicillins Hives, Shortness Of Breath      Pentazocine Other (See Comments)   Other reaction(s): Mental Status Changes (intolerance) Altered Mental Status  Altered Mental Status  Altered Mental Status  Sulfa Antibiotics Hives, Shortness Of Breath   Trovan [alatrofloxacin] Palpitations, Other (See Comments), Anaphylaxis   Chest pain, dizziness, irregular pulse   Zinc Gelatin [zinc] Anaphylaxis   Amlodipine Swelling   Calcium Channel Blockers    Respiratory distress   Carvedilol Other (See Comments)   Dizziness, "joint pain, depression"   Clindamycin/lincomycin    CP, lock jaw   Codeine Nausea Only   Cymbalta [duloxetine Hcl] Swelling   Diovan [valsartan] Swelling   Lisinopril Swelling   Sertraline Other (See Comments)   confusion   Tramadol Nausea Only   Loteprednol Etabonate Rash      Medication List    TAKE these medications   albuterol 108 (90 Base) MCG/ACT inhaler Commonly known as: VENTOLIN HFA INHALE 2 PUFFS BY MOUTH EVERY 6 HOURS AS NEEDED FOR WHEEZING What changed:   how much to take  how  to take this  when to take this  reasons to take this   aspirin 81 MG EC tablet Take 1 tablet (81 mg total) by mouth daily. Swallow whole. Start taking on: July 06, 2019   atenolol 25 MG tablet Commonly known as: TENORMIN Take 0.5 tablets (12.5 mg total) by mouth 2 (two) times daily. What changed:   when to take this  additional instructions   atorvastatin 40 MG tablet Commonly known as: LIPITOR Take 1 tablet (40 mg total) by mouth daily at 6 PM.   B-complex with vitamin C tablet Take 1 tablet by mouth every evening.   bumetanide 1 MG tablet Commonly known as: BUMEX Take 1 mg by mouth every other day.   bumetanide 0.5 MG tablet Commonly known as: BUMEX Take 0.5 mg by mouth every other day.   diphenhydrAMINE 25 MG tablet Commonly known as: BENADRYL Take 25 mg by mouth daily as needed for itching or allergies.   docusate sodium 100 MG capsule Commonly known as: COLACE Take 400 mg by mouth every morning.   esomeprazole 40 MG capsule Commonly known as: NEXIUM Take 1 capsule (40 mg total) by mouth 2 (two) times daily.   fexofenadine 180 MG tablet Commonly known as: ALLEGRA Take 180 mg by mouth every morning.   Gas-X Extra Strength 125 MG Caps Generic drug: Simethicone Take 2 capsules by mouth daily as needed (for gas).   HM Lidocaine Patch 4 % Ptch Generic drug: Lidocaine Apply 1 application topically daily.   levothyroxine 50 MCG tablet Commonly known as: Euthyrox Take 2 tablets (100 mcg total) by mouth every morning.   losartan 100 MG tablet Commonly known as: COZAAR Take 1 tablet (100 mg total) by mouth daily.   Magnesium 400 MG Tabs Take 400 mg by mouth every evening.   melatonin 3 MG Tabs tablet Take 3 mg by mouth at bedtime.   methocarbamol 500 MG tablet Commonly known as: ROBAXIN Take 1 tablet (500 mg total) by mouth 3 (three) times daily.   NON FORMULARY Diet: _____ Regular, __x____ NAS, _______Consistent  Carbohydrate, _______NPO _____Other   Olopatadine HCl 0.2 % Soln Apply 1 drop to eye daily as needed (for allergy eye).   oxyCODONE-acetaminophen 5-325 MG tablet Commonly known as: PERCOCET/ROXICET Take 1 tablet by mouth every 6 (six) hours as needed for up to 10 days for moderate pain or severe pain.   Poly-Iron 150 Forte 150-0.025-1 MG Caps Generic drug: Iron Polysacch Cmplx-B12-FA Take 1 capsule by mouth daily.   polyethylene glycol 17 g packet Commonly known as: MIRALAX / GLYCOLAX Take 17 g by mouth daily as  needed for mild constipation or moderate constipation (For Constipation).   predniSONE 5 MG tablet Commonly known as: DELTASONE Take 5 mg by mouth every morning.   Restasis 0.05 % ophthalmic emulsion Generic drug: cycloSPORINE INSTILL 1 DROP INTO EACH EYE TWICE DAILY What changed: See the new instructions.   Symbicort 160-4.5 MCG/ACT inhaler Generic drug: budesonide-formoterol Inhale 2 puffs by mouth twice daily What changed: when to take this   traZODone 150 MG tablet Commonly known as: DESYREL Take 1 tablet (150 mg total) by mouth at bedtime.   Venelex Oint Apply topically in the morning, at noon, and at bedtime. Apply to left buttock qshift.   vitamin B-12 500 MCG tablet Commonly known as: CYANOCOBALAMIN Take 1 tablet (500 mcg total) by mouth daily.   Vitamin D 50 MCG (2000 UT) Caps Take 2 capsules by mouth every evening.   warfarin 1 MG tablet Commonly known as: COUMADIN Take as directed. If you are unsure how to take this medication, talk to your nurse or doctor. Original instructions: Take 1 mg by mouth daily. Take 4mg  MWF and 2mg  TTSS per patient report last PTA dosing What changed: Another medication with the same name was added. Make sure you understand how and when to take each.   warfarin 2 MG tablet Commonly known as: COUMADIN Take as directed. If you are unsure how to take this medication, talk to your nurse or doctor. Original  instructions: Take 1 tablet (2 mg total) by mouth one time only at 4 PM. What changed: You were already taking a medication with the same name, and this prescription was added. Make sure you understand how and when to take each.          Outstanding Labs/Studies   INR followed by PCP  Duration of Discharge Encounter   Greater than 30 minutes including physician time.  Signed, Brittyn Salaz Ninfa Meeker, PA-C 07/05/2019, 2:41 PM

## 2019-07-05 NOTE — TOC Transition Note (Signed)
Transition of Care [facility]) - CM/SW Discharge Note   Patient Details  Name: Angela Wong MRN: [id number] Date of Birth: Aug 09, 1940  Transition of Care [facility]) CM/SW Contact:  Trula Ore, [place] Phone Number: 07/05/2019, 5:00 PM   Clinical Narrative:     Patient will DC to: [facility]  Anticipated DC date: 07/05/2019  Family notified: Corene Cornea  Transport by: Corey Harold  ?  Per MD patient ready for DC to [facility] . RN, patient, patient's family, and facility notified of DC. Discharge Summary sent to facility. RN given number for report tele# [phone] RM#[id number]. DC packet on chart. Ambulance transport requested for patient.  CSW signing off.  Final next level of care: Skilled Nursing Facility Barriers to Discharge: No Barriers Identified   Patient Goals and CMS Choice Patient states their goals for this hospitalization and ongoing recovery are:: to go to SNF CMS Medicare.gov Compare Post Acute Care list provided to:: Patient Choice offered to / list presented to : Patient  Discharge Placement              Patient chooses bed at: [facility] Patient to be transferred to facility by: [place] Name of family member notified: Corene Cornea Patient and family notified of of transfer: 07/05/19  Discharge Plan and Services                                     Social Determinants of Health (SDOH) Interventions     Readmission Risk Interventions No flowsheet data found.

## 2019-07-05 NOTE — Progress Notes (Signed)
[place] for Heparin & Coumadin Indication: chest pain/ACS  Allergies  Allergen Reactions  . Cephalosporins Hives and Shortness Of Breath  . Ciprofloxacin Hives and Shortness Of Breath  . Diltiazem Shortness Of Breath    Swollen throat   . Doxycycline Hives and Shortness Of Breath  . Horse-Derived Products Anaphylaxis  . Ketek [Telithromycin] Palpitations    Chest discomfort  . Nitrofuran Derivatives Anaphylaxis and Hives    blisters  . Nitrous Oxide Nausea And Vomiting    Severe due to Sjogrens (Auto-Immune Disease)  . Other Anaphylaxis    ALLERGY TO HORSE SERUM  . Penicillins Hives and Shortness Of Breath       . Pentazocine Other (See Comments)    Other reaction(s): Mental Status Changes (intolerance) Altered Mental Status  Altered Mental Status  Altered Mental Status   . Sulfa Antibiotics Hives and Shortness Of Breath  . Trovan [Alatrofloxacin] Palpitations, Other (See Comments) and Anaphylaxis    Chest pain, dizziness, irregular pulse  . Zinc Gelatin [Zinc] Anaphylaxis  . Amlodipine Swelling  . Calcium Channel Blockers     Respiratory distress  . Carvedilol Other (See Comments)    Dizziness, "joint pain, depression"  . Clindamycin/Lincomycin     CP, lock jaw  . Codeine Nausea Only  . Cymbalta [Duloxetine Hcl] Swelling  . Diovan [Valsartan] Swelling  . Lisinopril Swelling  . Sertraline Other (See Comments)    confusion  . Tramadol Nausea Only  . Loteprednol Etabonate Rash    Patient Measurements: Height: 5\' 4"  (162.6 cm) Weight: 64 kg (141 lb 1.5 oz) IBW/kg (Calculated) : 54.7 HEPARIN DW (KG): 62  Vital Signs: Temp: 98.3 F (36.8 C) (06/21 0825) Temp Source: Oral (06/21 0825) BP: 135/74 (06/21 0825) Pulse Rate: 59 (06/21 0825)  Labs: Recent Labs    07/03/19 0006 07/03/19 0006 07/03/19 1029 07/03/19 2019 07/04/19 0802 07/05/19 0457  HGB 10.3*   < >  --   --  11.0* 11.5*  HCT 31.2*  --   --   --  33.6*  35.0*  PLT 170  --   --   --  198 204  LABPROT 18.6*  --   --   --  28.4* 29.1*  INR 1.6*  --   --   --  2.8* 2.9*  HEPARINUNFRC <0.10*   < > 0.22* 0.39 0.65  --   CREATININE 0.77  --   --   --  0.78 0.78   < > = values in this interval not displayed.    Estimated Creatinine Clearance: 49.2 mL/min (by C-G formula based on SCr of 0.78 mg/dL).   Assessment: 79 y.o. female with h/o Afib s/p cath for heparin. Underwent cath 6/18- found to have thrombotic lesion at very distal PLA thought to be embolic event, possibly due to subtherapeutic INR. Consider change to DOAC. Patient is resistant to change to DOAC d/t to fear of cost. TOC consult placed to evaluate cost of apixaban and rivaroxaban.   On Coumadin PTA for Afib - PTA regimen is 2 mg daily except 4 mg MWF.  Off heparin since 6/20, INR remains therapeutic at 2.9 after 2 higher doses of warfarin. Hgb 11.5, plt 204. No s/sx of bleeding.   Discussed cost with patient and no interest in transitioning to [place].   Goal of Therapy:  INR 2-3 Monitor platelets by anticoagulation protocol: Yes   Plan:  Will restart warfarin 2 mg tonight  If discharged would recommend resuming PTA regimen  Monitor daily INR and CBC while on Coumadin Watch for s/sx of bleeding Awaiting SNF placement.   Antonietta Jewel, PharmD, [place] Clinical Pharmacist  Phone: [phone] 07/05/2019 12:03 PM  Please check AMION for all [place] phone numbers After 10:00 PM, call [place] [phone]

## 2019-07-05 NOTE — Progress Notes (Addendum)
Progress Note  Patient Name: Angela Wong Date of Encounter: 07/05/2019  [facility] Cardiologist: Dr. Vernona Rieger, remotely seen by Dr. Percival Spanish  Subjective   No chest pain or sob. She has a lot of questions about medications. She is teary-eyed during interview saying 'I am trying to cooperate'.   Inpatient Medications    Scheduled Meds: . aspirin EC  81 mg Oral Daily  . atenolol  12.5 mg Oral BID  . atorvastatin  40 mg Oral q1800  . bumetanide  0.5 mg Oral QODAY  . bumetanide  1 mg Oral QODAY  . cycloSPORINE  1 drop Both Eyes BID  . docusate sodium  400 mg Oral q morning - 10a  . fluticasone furoate-vilanterol  1 puff Inhalation Daily  . levothyroxine  100 mcg Oral Q0600  . loratadine  10 mg Oral Daily  . magnesium oxide  400 mg Oral QPM  . methocarbamol  500 mg Oral TID  . pantoprazole  40 mg Oral Daily  . predniSONE  5 mg Oral q morning - 10a  . sodium chloride flush  3 mL Intravenous Q12H  . sodium chloride flush  3 mL Intravenous Q12H  . traZODone  150 mg Oral QHS  . Warfarin - Pharmacist Dosing Inpatient   Does not apply q1600   Continuous Infusions: . sodium chloride    . nitroGLYCERIN Stopped (07/02/19 0935)   PRN Meds: sodium chloride, acetaminophen, acetaminophen, albuterol, morphine, nitroGLYCERIN, ondansetron (ZOFRAN) IV, ondansetron (ZOFRAN) IV, oxyCODONE-acetaminophen, simethicone, sodium chloride flush   Vital Signs    Vitals:   07/04/19 1813 07/04/19 2124 07/05/19 0057 07/05/19 0420  BP: 126/71 130/90 123/78 132/68  Pulse: 61 63 64 61  Resp: 18  14   Temp: 98.1 F (36.7 C) 98.2 F (36.8 C) 98 F (36.7 C) 97.7 F (36.5 C)  TempSrc: Oral Oral Oral Oral  SpO2: 98% 96% 97% 97%  Weight:    64 kg  Height:        Intake/Output Summary (Last 24 hours) at 07/05/2019 0820 Last data filed at 07/05/2019 0422 Gross per 24 hour  Intake 582 ml  Output 1950 ml  Net -1368 ml   Last 3 Weights 07/05/2019 07/04/2019 07/03/2019  Weight (lbs) 141 lb 1.5  oz 140 lb 14 oz 150 lb 12.7 oz  Weight (kg) 64 kg 63.9 kg 68.4 kg      Telemetry    Afib with intermittent V-pacing, HR 60s- Personally Reviewed  ECG    No new - Personally Reviewed  Physical Exam   GEN: No acute distress.   Neck: No JVD Cardiac: RRR, no murmurs, rubs, or gallops.  Respiratory: Clear to auscultation bilaterally. GI: Soft, nontender, non-distended  MS: No edema; No deformity. Neuro:  Nonfocal  Psych: Normal affect   Labs    High Sensitivity Troponin:   Recent Labs  Lab 07/01/19 0933 07/01/19 1153  TROPONINIHS 9 2,277*      Chemistry Recent Labs  Lab 07/03/19 0006 07/04/19 0802 07/05/19 0457  NA 130* 136 139  K 4.8 4.1 4.1  CL 99 101 104  CO2 24 27 28   GLUCOSE 99 93 83  BUN 18 14 11   CREATININE 0.77 0.78 0.78  CALCIUM 8.1* 8.6* 8.9  GFRNONAA >60 >60 >60  GFRAA >60 >60 >60  ANIONGAP 7 8 7      Hematology Recent Labs  Lab 07/03/19 0006 07/04/19 0802 07/05/19 0457  WBC 7.2 5.7 4.7  RBC 3.30* 3.57* 3.70*  HGB 10.3* 11.0*  11.5*  HCT 31.2* 33.6* 35.0*  MCV 94.5 94.1 94.6  MCH 31.2 30.8 31.1  MCHC 33.0 32.7 32.9  RDW 14.6 14.6 14.5  PLT 170 198 204    BNPNo results for input(s): BNP, PROBNP in the last 168 hours.   DDimer No results for input(s): DDIMER in the last 168 hours.   Radiology    No results found.  Cardiac Studies    Echo 06/23/19 1. Left ventricular ejection fraction, by estimation, is 50%. The left  ventricle has low normal function. The left ventricle demonstrates global  hypokinesis. There is mild left ventricular hypertrophy. Left ventricular  diastolic parameters are  indeterminate.  2. Right ventricular systolic function is normal. The right ventricular  size is normal. There is normal pulmonary artery systolic pressure. The  estimated right ventricular systolic pressure is 62.9 mmHg.  3. Left atrial size was moderately dilated.  4. The mitral valve is grossly normal with mild annular calcification.    Mild mitral valve regurgitation.  5. The aortic valve is tricuspid. Aortic valve regurgitation is not  visualized. Mild aortic valve sclerosis is present, with no evidence of  aortic valve stenosis.  6. The inferior vena cava is normal in size with greater than 50%  respiratory variability, suggesting right atrial pressure of 3 mmHg.   Cardiac Cath 06/2019 Conclusion    Mid LAD lesion is 25% stenosed.  RPAV lesion is 99% stenosed. This is a thrombotic lesion at the very distal PLA.  The left ventricular systolic function is normal.  LV end diastolic pressure is normal.  The left ventricular ejection fraction is 55-65% by visual estimate.  There is no aortic valve stenosis.  I suspect that her INR dropped and she had an embolic event with thrombus ending up in her distal PLA. It was too small for intervention at that part of the vessel. Continue medical therapy. The patient is currently pain-free.  I encouraged her to consider using Eliquis or Xarelto, rather than Coumadin to reduce the likelihood of events like this. She states she is comfortable on Coumadin. I spoke to her son Merry Proud. He asked that the team encourage her to switch to either Eliquis or Xarelto. 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  The son also asked that we encourage her to get the Covid vaccine.  Patient Profile     79 y.o. 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  She was found to have elevated troponin and sent to [facility] for cardiac  catheterization  Assessment & Plan    NSTEMI - HS troponin 9>2277 - cath showed 99% distal PLA too small for PCI and felt to be embolic from subtherapeutic INR - It was recommended patient switch from coumadin to [place] but patient requesting more information - She denies further CP - Aspirin, atenolol, statin  Persistent Afib - Rate controlled with intermittent V-pacing - coumadin per pharmacy restarted - INR normally followed by her PCP - She refuses to switch to [place] and wants more information about cost before she considers switching. Case manager consulted - INR today 2.9  HLD - Lipitor 40 mg  daily - 07/02/19 Chol 119, HDL 51, LDL 60, TG 42  Chronic diastolic CHF - Echo this admission with EF 50% - euvolemic on exam - LVEDP 11 at cath - Bumex was held for soft pressures>>restarted 6/20 - creatinine stable  HTN - BP stable on atenolol 12.5mg  BID - Home Losartan not continued on admission for ?soft pressures - BP this AM 135/74  Rheumatoid arthritis - Prednisone and Robaxin - plan to be discharged on percocet instead of morphine  Disposition - was previously at [facility] for rehab but does not plan to go back there - Case management was consulted  For questions or updates, please contact [facility] Please consult [email or web address] for contact info under        Signed, Cadence Ninfa Meeker, PA-C  07/05/2019, 8:20 AM    I have seen and examined the patient along with Cadence Ninfa Meeker, PA-C.  I have reviewed the chart, notes and new data.  I agree with PA/NP's note.  Key new complaints: no CV complaints. Back pain better with robaxin. Chronic shoulder problems. Used to self momitor INR at home, in therapeutic range 90%, has had one subtherapeutic level in last 3-4 years, until the NH hospitalization, when she was not in control of warfarin dosing or diet. Key examination changes: regular (paced) rhythm, no JVD, no edema, clear lungs Key new findings / data:  ECG Afib, V paced. Echo and cath reviewed  PLAN: If her INR is truly that well controlled, there is no compelling reason to transition to [place]. INR now 2.9 Appears euvolemic, diuretic restarted. Waiting for a new rehab facility.  Sanda Klein, MD, [place] [phone] 07/05/2019, 12:07 PM

## 2019-07-05 NOTE — TOC Progression Note (Signed)
Transition of Care [facility]) - Progression Note    Patient Details  Name: Angela Wong MRN: [id number] Date of Birth: 09/08/1940  Transition of Care [facility]) CM/SW [place], [place] Phone Number: 07/05/2019, 1:19 PM  Clinical Narrative:     CSW spoke with patient at bedside. CSW gave patient SNF bed offers. Patient chose to go to [facility]. [facility] confirmed that they have accepted patient with $40.00 copay a day for private room. Patient has accepted offer.  Patient has bed at [facility].   CSW will continue to follow.   Expected Discharge Plan: Skilled Nursing Facility Barriers to Discharge: Continued Medical Work up  Expected Discharge Plan and Services Expected Discharge Plan: [place]                                               Social Determinants of Health (SDOH) Interventions    Readmission Risk Interventions No flowsheet data found.

## 2019-07-05 NOTE — Evaluation (Signed)
Occupational Therapy Evaluation Patient Details Name: Angela Wong MRN: [id number] DOB: 1940-02-01 Today's Date: 07/05/2019    History of Present Illness Pt adm with NSTEMI. PMH - pacer, chf, copd, htn, asthma, Raynauds, RA, chronic pain, restless leg syndrome, ovarian CA, OA   Clinical Impression   Pt PTA: Pt from SNF requiring assist for ADL and mobility- transfers only. Pt currently, minA to [facility] for ADL tasks. Pt is severely limited by osteoarthritis, pain, decreased strength, decreased endurance and decreased ability for self care.  Pt modA overall for bed mobility. Pt unable to stand today due to pt too dizzy and loopy after pain medication and muscle relaxer. Pt would benefit from continued OT skilled services. OT following acutely. BP 121/65, 62 BPM at EOB with dizziness reported.       Follow Up Recommendations  SNF    Equipment Recommendations  Other (comment) (defer to next facility)    Recommendations for Other Services       Precautions / Restrictions Precautions Precautions: Fall Restrictions Weight Bearing Restrictions: Yes RUE Weight Bearing: Partial weight bearing RUE Partial Weight Bearing Percentage or Pounds: 5      Mobility Bed Mobility Overal bed mobility: Needs Assistance Bed Mobility: Supine to Sit;Sit to Supine     Supine to sit: Mod assist;HOB elevated Sit to supine: Min assist   General bed mobility comments: Pt minA to scoot to [facility] and pt modA overall for bed mobility.  Transfers                 General transfer comment: deferred as pt too dizzy and loopy after pain medication and muscle relaxer.    Balance Overall balance assessment: Needs assistance Sitting-balance support: No upper extremity supported;Feet supported Sitting balance-Leahy Scale: Good                                     ADL either performed or assessed with clinical judgement   ADL Overall ADL's : Needs  assistance/impaired Eating/Feeding: Minimal assistance;Sitting   Grooming: Minimal assistance;Sitting   Upper Body Bathing: Minimal assistance;Sitting   Lower Body Bathing: Moderate assistance;Sitting/lateral leans;Sit to/from stand   Upper Body Dressing : Minimal assistance;Sitting   Lower Body Dressing: Moderate assistance;Sitting/lateral leans;Sit to/from Health and safety inspector Details (indicate cue type and reason): sitting EOB; pt too dizzy to progress         Functional mobility during ADLs: Maximal assistance General ADL Comments: Pt minA to [facility] for ADL tasks. Pt is severely limited by osteoarthritis, pain, decreased strength, decreased endurance and decreased ability for self care.     Vision Baseline Vision/History: Wears glasses Wears Glasses: At all times Patient Visual Report: No change from baseline Vision Assessment?: No apparent visual deficits     Perception     Praxis      Pertinent Vitals/Pain Pain Assessment: Faces Faces Pain Scale: Hurts little more Pain Location: BLE to touch Pain Descriptors / Indicators: Sore Pain Intervention(s): Limited activity within patient's tolerance     Hand Dominance Left   Extremity/Trunk Assessment Upper Extremity Assessment Upper Extremity Assessment: Generalized weakness;RUE deficits/detail RUE Deficits / Details: torn rotator cuff and moderate RA RUE Coordination: decreased gross motor   Lower Extremity Assessment Lower Extremity Assessment: Generalized weakness   Cervical / Trunk Assessment Cervical / Trunk Assessment: Kyphotic   Communication Communication Communication: No difficulties   Cognition Arousal/Alertness: Awake/alert Behavior During Therapy: [facility]  for tasks assessed/performed Overall Cognitive Status: Within Functional Limits for tasks assessed                                     General Comments  BP 121/65, O2 >90% on RA, 62 BPM at EOB with dizziness reported.     Exercises     Shoulder Instructions      Home Living Family/patient expects to be discharged to:: Skilled nursing facility                             Home Equipment: Gilford Rile - 4 wheels;Cane - single point;Walker - standard;Tub bench;Grab bars - tub/shower;Grab bars - toilet          Prior Functioning/Environment Level of Independence: Needs assistance  Gait / Transfers Assistance Needed: Prior to recent hospitalization pt was amb household distances with rollator. Since pt at SNF only performing transfers bed to chair etc with assist              OT Problem List: Decreased strength;Decreased activity tolerance;Impaired balance (sitting and/or standing);Decreased coordination;Decreased safety awareness;Pain;Impaired UE functional use;Increased edema;Decreased knowledge of precautions;Decreased knowledge of use of DME or AE;Cardiopulmonary status limiting activity      OT Treatment/Interventions: Self-care/ADL training;Therapeutic exercise;Neuromuscular education;Energy conservation;DME and/or AE instruction;Therapeutic activities;Cognitive remediation/compensation;Patient/family education;Balance training    OT Goals(Current goals can be found in the care plan section) Acute Rehab OT Goals Patient Stated Goal: rehab and then return home OT Goal Formulation: With patient Time For Goal Achievement: 07/19/19 Potential to Achieve Goals: Good ADL Goals Pt Will Transfer to Toilet: with min guard assist;squat pivot transfer;bedside commode Pt/caregiver will Perform Home Exercise Program: Increased strength;With Supervision Additional ADL Goal #1: Pt will increase to minA overall for bed mobility as precursor for OOB ADL.  OT Frequency: Min 2X/week   Barriers to D/C: Decreased caregiver support          Co-evaluation              AM-PAC OT "6 Clicks" Daily Activity     Outcome Measure Help from another person eating meals?: A Little Help from another person  taking care of personal grooming?: A Little Help from another person toileting, which includes using toliet, bedpan, or urinal?: A Lot Help from another person bathing (including washing, rinsing, drying)?: A Lot Help from another person to put on and taking off regular upper body clothing?: A Lot Help from another person to put on and taking off regular lower body clothing?: A Lot 6 Click Score: 14   End of Session Nurse Communication: Mobility status  Activity Tolerance: Patient limited by fatigue;Patient limited by pain Patient left: in bed;with call bell/phone within reach  OT Visit Diagnosis: Unsteadiness on feet (R26.81);Muscle weakness (generalized) (M62.81)                Time: 1110-1130 OT Time Calculation (min): 20 min Charges:  OT General Charges $OT Visit: 1 Visit OT Evaluation $OT Eval Moderate Complexity: 1 Mod  Jefferey Pica, OTR/L Acute Rehabilitation Services Pager: [phone] Office: [phone]   Jionni Helming C 07/05/2019, 5:53 PM

## 2019-07-07 ENCOUNTER — Other Ambulatory Visit: Payer: Self-pay | Admitting: *Deleted

## 2019-07-07 MED FILL — Fentanyl Citrate Preservative Free (PF) Inj 100 MCG/2ML: INTRAMUSCULAR | Qty: 2 | Status: AC

## 2019-07-07 NOTE — Patient Outreach (Signed)
Screened for potential [facility] Care Management needs as a benefit of  [employer] Medicare.  [name] is now at [facility] SNF receiving skilled therapy. Recent stay at [facility] before readmitting to the hospital.   Writer attended telephonic interdisciplinary team meeting to assess for disposition needs and transition plan for resident.   Facility SW reports transition plan is to return home. She lived alone prior. Has 2 supportive sons that live locally.   [name] goes to PCP at [facility] that has [facility] Embedded chronic care management team. Will keep [facility] ECM team updated.   Will continue to follow while member resides in SNF.   Angela Rolling, MSN-Ed, RN,BSN [place] Acute Care Coordinator [phone] [facility]) [phone]  (Toll free office)

## 2019-07-08 ENCOUNTER — Telehealth: Payer: Self-pay | Admitting: Allergy & Immunology

## 2019-07-08 NOTE — Telephone Encounter (Signed)
I think she should be good to get the vaccine.  She has a lot of medical care there and they can certainly manage anaphylaxis should anything happen.  Salvatore Marvel, MD Allergy and [place] [facility] [place]

## 2019-07-08 NOTE — Telephone Encounter (Signed)
Patient called and would like advice from Dr. Ernst Bowler regarding the COVID vaccine. Patient was scheduled for a component testing in May then she had heart failure then a heart attack and is now at [facility] for rehab. Patient states she talked to the doctor there and they think she will be fine with the ingredients in the vaccine, but they would have to call EMS if she had a reaction. Patient would like to know if she should get the shot or wait until she is back on her feet and can come into the office. Patient's cell phone number is [phone] and her direct number at [facility] is [phone].  Please advise.

## 2019-07-09 NOTE — Telephone Encounter (Signed)
Late Entry  Spoke with patient in regards to this matter yesterday 07/08/2019 and she stated that she spoke with the doctor at [facility] and was informed that they are not able to handle anaphylaxis unless the patient has medication in their supplies. I spoke with Dr. Ernst Bowler and informed him of this and he stated we could just hold off until she is discharged from [facility]. Patient will call the office once she is discharged.

## 2019-07-14 ENCOUNTER — Other Ambulatory Visit: Payer: Self-pay | Admitting: *Deleted

## 2019-07-14 ENCOUNTER — Telehealth: Payer: Self-pay

## 2019-07-14 NOTE — Patient Outreach (Signed)
Screened for potential [facility] Care Management needs as a benefit of  [employer] Medicare.  Ms. Fountaine is currently receiving skilled therapy at [facility].   Writer attended telephonic interdisciplinary team meeting to assess for disposition needs and transition plan for resident.   Facility reports member is ambulating around 18 feet. She is mod to max with ADLs. Pain has been a barrier. Facility pain control PA to see member to today to address pain issues.  Transition plan to return home. Lives alone.   Will continue to follow while member resides in SNF. Will update [facility] ECM team at [facility].   Marthenia Rolling, MSN-Ed, RN,BSN [place] Acute Care Coordinator [phone] [facility] [address]) [phone]  (Toll free office)

## 2019-07-14 NOTE — Telephone Encounter (Signed)
Called patient in regards to breast imaging that needs to be r/s. LMTCB

## 2019-07-20 ENCOUNTER — Ambulatory Visit: Payer: Medicare Other

## 2019-07-20 DIAGNOSIS — F329 Major depressive disorder, single episode, unspecified: Secondary | ICD-10-CM | POA: Diagnosis not present

## 2019-07-20 DIAGNOSIS — F411 Generalized anxiety disorder: Secondary | ICD-10-CM | POA: Diagnosis not present

## 2019-07-20 DIAGNOSIS — F4320 Adjustment disorder, unspecified: Secondary | ICD-10-CM | POA: Diagnosis not present

## 2019-07-21 ENCOUNTER — Other Ambulatory Visit: Payer: Self-pay | Admitting: *Deleted

## 2019-07-21 NOTE — Patient Outreach (Signed)
Screened for potential [facility] Care Management needs as a benefit of  [employer] Medicare.  Ms. No remains at [facility] SNF receiving skilled therapy.   Writer attended telephonic interdisciplinary team meeting to assess for disposition needs and transition plan for resident.   Facility reports no adjustments were made last week to pain regimen per Ms. Alldredge's request. Member continues to have spasms. Currently being treated with lasix for lower extremity edema. Facility reports member is encouraged to keep legs elevated and to stay up in chair longer. Facility has scheduled family meeting today with son and daughter in law to discuss transition plans and the need for additional assistance upon dc.  Will continue to follow member while in SNF. Will update [facility] Chronic Care Management embedded team with [place] as needed.    Marthenia Rolling, MSN-Ed, RN,BSN [place] Acute Care Coordinator [phone] [facility]) [phone]  (Toll free office)

## 2019-07-22 ENCOUNTER — Encounter: Payer: Self-pay | Admitting: Cardiology

## 2019-07-22 ENCOUNTER — Ambulatory Visit (INDEPENDENT_AMBULATORY_CARE_PROVIDER_SITE_OTHER): Payer: Medicare Other | Admitting: Cardiology

## 2019-07-22 ENCOUNTER — Telehealth: Payer: Self-pay | Admitting: *Deleted

## 2019-07-22 ENCOUNTER — Other Ambulatory Visit: Payer: Self-pay

## 2019-07-22 VITALS — BP 117/72 | HR 70 | Ht 64.0 in | Wt 146.0 lb

## 2019-07-22 DIAGNOSIS — M797 Fibromyalgia: Secondary | ICD-10-CM

## 2019-07-22 DIAGNOSIS — Z789 Other specified health status: Secondary | ICD-10-CM

## 2019-07-22 DIAGNOSIS — I4821 Permanent atrial fibrillation: Secondary | ICD-10-CM | POA: Diagnosis not present

## 2019-07-22 DIAGNOSIS — M0579 Rheumatoid arthritis with rheumatoid factor of multiple sites without organ or systems involvement: Secondary | ICD-10-CM

## 2019-07-22 DIAGNOSIS — I25119 Atherosclerotic heart disease of native coronary artery with unspecified angina pectoris: Secondary | ICD-10-CM | POA: Diagnosis not present

## 2019-07-22 DIAGNOSIS — G8929 Other chronic pain: Secondary | ICD-10-CM | POA: Diagnosis not present

## 2019-07-22 DIAGNOSIS — Z950 Presence of cardiac pacemaker: Secondary | ICD-10-CM | POA: Diagnosis not present

## 2019-07-22 DIAGNOSIS — I214 Non-ST elevation (NSTEMI) myocardial infarction: Secondary | ICD-10-CM | POA: Diagnosis not present

## 2019-07-22 DIAGNOSIS — E785 Hyperlipidemia, unspecified: Secondary | ICD-10-CM

## 2019-07-22 DIAGNOSIS — R600 Localized edema: Secondary | ICD-10-CM

## 2019-07-22 DIAGNOSIS — Z7409 Other reduced mobility: Secondary | ICD-10-CM | POA: Diagnosis not present

## 2019-07-22 MED ORDER — LOSARTAN POTASSIUM 50 MG PO TABS: 50.0000 mg | ORAL_TABLET | Freq: Every day | ORAL | 3 refills | Status: DC

## 2019-07-22 NOTE — Patient Instructions (Signed)
Medication Instructions:   DECREASE Losartan to 50 mg daily  INCREASE Bumex to 2 mg daily for 2 days then on day 3 and forward TAKE 1 mg daily  *If you need a refill on your cardiac medications before your next appointment, please call your pharmacy*  Lab Work: NONE ordered at this time of appointment   If you have labs (blood work) drawn today and your tests are completely normal, you will receive your results only by: Marland Kitchen MyChart Message (if you have MyChart) OR . A paper copy in the mail If you have any lab test that is abnormal or we need to change your treatment, we will call you to review the results.  Testing/Procedures: NONE ordered at this time of appointment   Follow-Up: At [facility], you and your health needs are our priority.  As part of our continuing mission to provide you with exceptional heart care, we have created designated Provider Care Teams.  These Care Teams include your primary Cardiologist (physician) and Advanced Practice Providers (APPs -  Physician Assistants and Nurse Practitioners) who all work together to provide you with the care you need, when you need it.  Your next appointment:   AS NEEDED    The format for your next appointment:   Either In Person or Virtual  Provider:   You may see Minus Breeding, MD or one of the following Advanced Practice Providers on your designated Care Team:    Rosaria Ferries, PA-C  Jory Sims, DNP, ANP  Cadence Kathlen Mody, PA-C  Other Instructions

## 2019-07-22 NOTE — Assessment & Plan Note (Signed)
83% rPLA embolic occlusion 4/37/3578

## 2019-07-22 NOTE — Progress Notes (Signed)
Cardiology Office Note:    Date:  07/22/2019   ID:  Angela Wong, DOB May 11, 1940, MRN [id number]  PCP:  [name], FNP  Cardiologist:  Minus Breeding, MD  Electrophysiologist:  None   Referring MD: [name], FNP   CC: pt c/o LE edema  History of Present Illness:    Angela Wong is a 79 y.o. female with a history of multiple medical problems.  From a cardiac standpoint she has a history of chronic atrial fibrillation.  She is on chronic Coumadin anticoagulation and treated with rate control.  She has sick sinus syndrome and has had a Medtronic pacemaker placed in 2016, I believe this was in [place].  Other medical conditions include rheumatoid arthritis, osteoarthritis, fibromyalgia, spinal stenosis and chronic back pain, essential hypertension, and multiple drug intolerances (25 listed).   Patient was admitted 06/22/2019 with complaints of lower extremity edema and increasing dyspnea.  Her diuretics had been increased as an outpatient with no significant improvement.  She was given a diagnosis of acute on chronic diastolic heart failure.  Echocardiogram 06/23/2019 showed ejection fraction of 50%.  She was instructed to follow-up with her cardiologist at [facility].  She was discharged to [place] for rehab.  On 07/01/2019 she was admitted from [place] to the [facility] ER with complaints of substernal chest pain.  Her troponin was elevated at 2277.  It was noted that her INR had become subtherapeutic while she was at [facility] rehab, her INR was 1.5.  She underwent diagnostic catheterization 07/02/2019 which revealed a 99% distal PLA branch occlusion which was felt to be embolic secondary to her subtherapeutic INR.  She had no other significant coronary disease.  The plan is for continued medical therapy.  She was discharged to [facility] rehab.  She presents to the office today for follow-up.  She has multiple complaints, mainly generalized  weakness and chronic joint pain and back pain.  She tells me she was placed on Robaxin at one point that she could not tolerate.  She does say that she did well with OxyContin.  She is not had recurrent chest pain.  Her main complaint is lower extremity edema.   Past Medical History:  Diagnosis Date  . A-fib ([place])   . Asthma   . Cerebral vasculitis   . Congestive dilated cardiomyopathy ([place])   . COPD (chronic obstructive pulmonary disease) ([place])   . Fibromyalgia   . Gastric polyp   . GERD (gastroesophageal reflux disease)   . Hiatal hernia   . Hyperparathyroidism ([place])   . IBS (irritable bowel syndrome)   . MVP (mitral valve prolapse)   . Osteoarthritis   . Ovarian cancer ([place])    lymph node removal with hysterectomy  . Raynaud's disease   . RLS (restless legs syndrome)   . Situational depression   . Sjogren's syndrome ([place])   . Vasculitis ([place])   . Vitamin D deficiency     Past Surgical History:  Procedure Laterality Date  . ABDOMINAL HYSTERECTOMY  1982   with right oophorectomy  . APPENDECTOMY    . BREAST BIOPSY Right    x 2  . BREAST SURGERY     Biopsy  . CARPAL TUNNEL RELEASE Right 1980  . CARPAL TUNNEL RELEASE Left 2010   x 2  . CATARACT EXTRACTION Bilateral   . CESAREAN SECTION     x 3  . KNEE SURGERY Left 2005  . LEFT HEART CATH  AND CORONARY ANGIOGRAPHY N/A 07/02/2019   Procedure: LEFT HEART CATH AND CORONARY ANGIOGRAPHY;  Surgeon: Jettie Booze, MD;  Location: [facility];  Service: Cardiovascular;  Laterality: N/A;  . OOPHORECTOMY Left 1962  . PACEMAKER INSERTION  09/15/2014  . REFRACTIVE SURGERY Bilateral 2014  . TUBAL LIGATION      Current Medications: Current Meds  Medication Sig  . albuterol (VENTOLIN HFA) 108 (90 Base) MCG/ACT inhaler INHALE 2 PUFFS BY MOUTH EVERY 6 HOURS AS NEEDED FOR WHEEZING (Patient taking differently: Inhale 2 puffs into the lungs every 6 (six) hours as needed for wheezing or shortness of breath. INHALE 2 PUFFS BY  MOUTH EVERY 6 HOURS AS NEEDED FOR WHEEZING)  . atenolol (TENORMIN) 25 MG tablet Take 0.5 tablets (12.5 mg total) by mouth 2 (two) times daily.  . B Complex-C (B-COMPLEX WITH VITAMIN C) tablet Take 1 tablet by mouth every evening.   Roseanne Kaufman Peru-Castor Oil (VENELEX) OINT Apply topically in the morning, at noon, and at bedtime. Apply to left buttock qshift.   . bumetanide (BUMEX) 1 MG tablet Take 1 mg by mouth every other day.  . Cholecalciferol (VITAMIN D) 2000 UNITS CAPS Take 2 capsules by mouth every evening.   . diphenhydrAMINE (BENADRYL) 25 MG tablet Take 25 mg by mouth daily as needed for itching or allergies.   Marland Kitchen docusate sodium (COLACE) 100 MG capsule Take 400 mg by mouth every morning.   Marland Kitchen esomeprazole (NEXIUM) 40 MG capsule Take 1 capsule (40 mg total) by mouth 2 (two) times daily.  . fexofenadine (ALLEGRA) 180 MG tablet Take 180 mg by mouth every morning.   . Iron Polysacch Cmplx-B12-FA (POLY-IRON 150 FORTE) 150-0.025-1 MG CAPS Take 1 capsule by mouth daily.  Marland Kitchen levothyroxine (EUTHYROX) 50 MCG tablet Take 2 tablets (100 mcg total) by mouth every morning.  Marland Kitchen losartan (COZAAR) 50 MG tablet Take 1 tablet (50 mg total) by mouth daily.  . Magnesium 400 MG TABS Take 400 mg by mouth every evening.   . melatonin 3 MG TABS tablet Take 3 mg by mouth at bedtime.  . methocarbamol (ROBAXIN) 500 MG tablet Take 1 tablet (500 mg total) by mouth 3 (three) times daily.  . NON FORMULARY Diet: _____ Regular, __x____ NAS, _______Consistent Carbohydrate, _______NPO _____Other   . Olopatadine HCl 0.2 % SOLN Apply 1 drop to eye daily as needed (for allergy eye).  Marland Kitchen oxyCODONE-acetaminophen (PERCOCET/ROXICET) 5-325 MG tablet Take 1 tablet by mouth every 6 (six) hours as needed for severe pain.  . polyethylene glycol (MIRALAX / GLYCOLAX) 17 g packet Take 17 g by mouth daily as needed for mild constipation or moderate constipation (For Constipation).   . predniSONE (DELTASONE) 5 MG tablet Take 5 mg by mouth  every morning.  . RESTASIS 0.05 % ophthalmic emulsion INSTILL 1 DROP INTO EACH EYE TWICE DAILY (Patient taking differently: Place 1 drop into both eyes 2 (two) times daily. )  . Simethicone (GAS-X EXTRA STRENGTH) 125 MG CAPS Take 2 capsules by mouth daily as needed (for gas).   . SYMBICORT 160-4.5 MCG/ACT inhaler Inhale 2 puffs by mouth twice daily (Patient taking differently: Inhale 2 puffs into the lungs in the morning and at bedtime. )  . traZODone (DESYREL) 150 MG tablet Take 1 tablet (150 mg total) by mouth at bedtime.  . vitamin B-12 (CYANOCOBALAMIN) 500 MCG tablet Take 1 tablet (500 mcg total) by mouth daily.  Marland Kitchen warfarin (COUMADIN) 1 MG tablet Take 1 tablet (1 mg total) by mouth  daily. Take 4mg  MWF and 2mg  TTSS per patient report last PTA dosing  . [DISCONTINUED] aspirin EC 81 MG EC tablet Take 1 tablet (81 mg total) by mouth daily. Swallow whole.  . [DISCONTINUED] atorvastatin (LIPITOR) 40 MG tablet Take 1 tablet (40 mg total) by mouth daily at 6 PM.  . [DISCONTINUED] bumetanide (BUMEX) 0.5 MG tablet Take 0.5 mg by mouth every other day.  . [DISCONTINUED] Lidocaine (HM LIDOCAINE PATCH) 4 % PTCH Apply 1 application topically daily.  . [DISCONTINUED] losartan (COZAAR) 100 MG tablet Take 1 tablet (100 mg total) by mouth daily.     Allergies:   Cephalosporins, Ciprofloxacin, Diltiazem, Doxycycline, Horse-derived products, Ketek [telithromycin], Nitrofuran derivatives, Nitrous oxide, Other, Penicillins, Pentazocine, Sulfa antibiotics, Trovan [alatrofloxacin], Zinc gelatin [zinc], Amlodipine, Calcium channel blockers, Carvedilol, Clindamycin/lincomycin, Codeine, Cymbalta [duloxetine hcl], Diovan [valsartan], Lisinopril, Sertraline, Tramadol, and Loteprednol etabonate   Social History   Socioeconomic History  . Marital status: Single    Spouse name: Not on file  . Number of children: Not on file  . Years of education: Not on file  . Highest education level: Not on file  Occupational History   . Not on file  Tobacco Use  . Smoking status: Never Smoker  . Smokeless tobacco: Never Used  Vaping Use  . Vaping Use: Never used  Substance and Sexual Activity  . Alcohol use: No  . Drug use: No  . Sexual activity: Not on file  Other Topics Concern  . Not on file  Social History Narrative   Lives alone.  Moved from [place].     Caffeine- 6 cups daily, mix of caffeine/decaf   Children- 3   Retired Therapist, sports   Social Determinants of Radio broadcast assistant Strain:   . Difficulty of Paying Living Expenses:   Food Insecurity:   . Worried About Charity fundraiser in the Last Year:   . Arboriculturist in the Last Year:   Transportation Needs: Unmet Transportation Needs  . Lack of Transportation (Medical): Yes  . Lack of Transportation (Non-Medical): No  Physical Activity:   . Days of Exercise per Week:   . Minutes of Exercise per Session:   Stress:   . Feeling of Stress :   Social Connections:   . Frequency of Communication with Friends and Family:   . Frequency of Social Gatherings with Friends and Family:   . Attends Religious Services:   . Active Member of Clubs or Organizations:   . Attends Archivist Meetings:   Marland Kitchen Marital Status:      Family History: The patient's family history includes Allergic rhinitis in her father and mother; Arthritis/Rheumatoid in her sister; Asthma in her sister; Breast cancer in her maternal aunt and mother; COPD in her mother; Heart attack in her sister; Heart disease in her father; Kidney disease in her father; Lupus in her sister; Scleroderma in her grandchild; Stroke in her paternal grandmother and sister; Thyroid disease in an other family member.  ROS:   Please see the history of present illness.     All other systems reviewed and are negative.  EKGs/Labs/Other Studies Reviewed:    The following studies were reviewed today:  Echo 06/23/2019- IMPRESSIONS    1. Left ventricular ejection fraction, by estimation, is 50%. The  left  ventricle has low normal function. The left ventricle demonstrates global  hypokinesis. There is mild left ventricular hypertrophy. Left ventricular  diastolic parameters are  indeterminate.  2. Right ventricular systolic  function is normal. The right ventricular  size is normal. There is normal pulmonary artery systolic pressure. The  estimated right ventricular systolic pressure is 71.0 mmHg.  3. Left atrial size was moderately dilated.  4. The mitral valve is grossly normal with mild annular calcification.  Mild mitral valve regurgitation.  5. The aortic valve is tricuspid. Aortic valve regurgitation is not  visualized. Mild aortic valve sclerosis is present, with no evidence of  aortic valve stenosis.  6. The inferior vena cava is normal in size with greater than 50%  respiratory variability, suggesting right atrial pressure of 3 mmHg.   Cath 07/02/2019-  EKG:  EKG is not ordered today.  The ekg ordered 07/02/2019 demonstrates V-paced  Recent Labs: 03/03/2019: TSH 2.760 06/22/2019: ALT 15; B Natriuretic Peptide 296.0 06/24/2019: Magnesium 2.0 07/05/2019: BUN 11; Creatinine, Ser 0.78; Hemoglobin 11.5; Platelets 204; Potassium 4.1; Sodium 139  Recent Lipid Panel    Component Value Date/Time   CHOL 119 07/02/2019 0500   CHOL 152 03/03/2019 1040   TRIG 42 07/02/2019 0500   HDL 51 07/02/2019 0500   HDL 69 03/03/2019 1040   CHOLHDL 2.3 07/02/2019 0500   VLDL 8 07/02/2019 0500   LDLCALC 60 07/02/2019 0500   LDLCALC 68 03/03/2019 1040    Physical Exam:    VS:  BP 117/72   Pulse 70   Ht 5\' 4"  (1.626 m)   Wt 146 lb (66.2 kg)   SpO2 97%   BMI 25.06 kg/m     Wt Readings from Last 3 Encounters:  07/22/19 146 lb (66.2 kg)  07/05/19 141 lb 1.5 oz (64 kg)  06/29/19 132 lb (59.9 kg)     GEN: Chronically ill appearing Caucasian female in a wheel chair in no acute distress HEENT: Normal NECK: No JVD;  CARDIAC: irregularly irregular, no murmurs, rubs,  gallops RESPIRATORY:  Clear to auscultation without rales, wheezing or rhonchi  ABDOMEN:  non-distended MUSCULOSKELETAL:  1-2 pitting LE edema; No deformity  SKIN: Warm and dry- chronic skin changes LE NEUROLOGIC:  Alert and oriented x 3 PSYCHIATRIC:  Normal affect   ASSESSMENT:    Non-STEMI (non-ST elevated myocardial infarction) ([place]) Admitted 07/01/2019 with NSTEMI- felt to be secondary to occluded dPLA secondary to embolism secondary to sub therapeutic INR (1.6)  Edema of both lower extremities This seems to be her primary complaint for the last several weeks  Atrial fibrillation, permanent ([place]) Rate control-Coumadin (PCP follows)  CAD (coronary artery disease) 62% rPLA embolic occlusion 6/94/8546  Pacemaker MDT Pacemaker 2016-   PLAN:    I suggested she decrease her Losartan to 50 mg daily and increase her Bumex to 2 mg daily for two days, then 1 mg daily.  She will follow up with her PCP and her primary cardiologist at [facility].    Medication Adjustments/Labs and Tests Ordered: Current medicines are reviewed at length with the patient today.  Concerns regarding medicines are outlined above.  Orders Placed This Encounter  Procedures  . EKG 12-Lead   Meds ordered this encounter  Medications  . losartan (COZAAR) 50 MG tablet    Sig: Take 1 tablet (50 mg total) by mouth daily.    Dispense:  90 tablet    Refill:  3    Patient Instructions  Medication Instructions:   DECREASE Losartan to 50 mg daily  INCREASE Bumex to 2 mg daily for 2 days then on day 3 and forward TAKE 1 mg daily  *If you need a refill  on your cardiac medications before your next appointment, please call your pharmacy*  Lab Work: NONE ordered at this time of appointment   If you have labs (blood work) drawn today and your tests are completely normal, you will receive your results only by: Marland Kitchen MyChart Message (if you have MyChart) OR . A paper copy in the mail If you have any lab test that  is abnormal or we need to change your treatment, we will call you to review the results.  Testing/Procedures: NONE ordered at this time of appointment   Follow-Up: At [facility], you and your health needs are our priority.  As part of our continuing mission to provide you with exceptional heart care, we have created designated Provider Care Teams.  These Care Teams include your primary Cardiologist (physician) and Advanced Practice Providers (APPs -  Physician Assistants and Nurse Practitioners) who all work together to provide you with the care you need, when you need it.  Your next appointment:   AS NEEDED    The format for your next appointment:   Either In Person or Virtual  Provider:   You may see Minus Breeding, MD or one of the following Advanced Practice Providers on your designated Care Team:    Rosaria Ferries, PA-C  Jory Sims, DNP, ANP  Cadence Kathlen Mody, PA-C  Other Instructions       Signed, Kerin Ransom, PA-C  07/22/2019 4:05 PM    [facility]

## 2019-07-22 NOTE — Assessment & Plan Note (Signed)
This seems to be her primary complaint for the last several weeks

## 2019-07-22 NOTE — Assessment & Plan Note (Signed)
Rate control-Coumadin (PCP follows)

## 2019-07-22 NOTE — Telephone Encounter (Signed)
Pt is being discharged from [facility] on 07/24/19 so the facility called and scheduled the follow up appt.  Pt just needs the [facility] call once discharged

## 2019-07-22 NOTE — Assessment & Plan Note (Signed)
Admitted 07/01/2019 with NSTEMI- felt to be secondary to occluded dPLA secondary to embolism secondary to sub therapeutic INR (1.6)

## 2019-07-22 NOTE — Assessment & Plan Note (Signed)
MDT Pacemaker 2016-

## 2019-07-23 ENCOUNTER — Telehealth: Payer: Self-pay | Admitting: Family

## 2019-07-23 MED ORDER — ONDANSETRON HCL 4 MG PO TABS: 4.0000 mg | ORAL_TABLET | Freq: Three times a day (TID) | ORAL | 0 refills | Status: DC | PRN

## 2019-07-23 NOTE — Telephone Encounter (Signed)
Zofran Prescription sent to pharmacy  ° °

## 2019-07-23 NOTE — Telephone Encounter (Signed)
Pt recent discharged from [facility]--she wants new rx for zofran because inner ear or migraine that is causing nausea. Pt has apt with Christy next week. Use [facility] pharmacy. Pt is still at the nursing home please call back [phone]

## 2019-07-23 NOTE — Telephone Encounter (Signed)
Pt called back because she said she gave [place] the wrong phone # to be called back at. Correct # is [phone].

## 2019-07-23 NOTE — Telephone Encounter (Signed)
Patient aware and verbalized understanding. °

## 2019-07-25 DIAGNOSIS — Z7952 Long term (current) use of systemic steroids: Secondary | ICD-10-CM | POA: Diagnosis not present

## 2019-07-25 DIAGNOSIS — I214 Non-ST elevation (NSTEMI) myocardial infarction: Secondary | ICD-10-CM | POA: Diagnosis not present

## 2019-07-25 DIAGNOSIS — Z9181 History of falling: Secondary | ICD-10-CM | POA: Diagnosis not present

## 2019-07-25 DIAGNOSIS — I5032 Chronic diastolic (congestive) heart failure: Secondary | ICD-10-CM | POA: Diagnosis not present

## 2019-07-25 DIAGNOSIS — I2510 Atherosclerotic heart disease of native coronary artery without angina pectoris: Secondary | ICD-10-CM | POA: Diagnosis not present

## 2019-07-25 DIAGNOSIS — M4800 Spinal stenosis, site unspecified: Secondary | ICD-10-CM | POA: Diagnosis not present

## 2019-07-25 DIAGNOSIS — K219 Gastro-esophageal reflux disease without esophagitis: Secondary | ICD-10-CM | POA: Diagnosis not present

## 2019-07-25 DIAGNOSIS — M797 Fibromyalgia: Secondary | ICD-10-CM | POA: Diagnosis not present

## 2019-07-25 DIAGNOSIS — I7300 Raynaud's syndrome without gangrene: Secondary | ICD-10-CM | POA: Diagnosis not present

## 2019-07-25 DIAGNOSIS — I4819 Other persistent atrial fibrillation: Secondary | ICD-10-CM | POA: Diagnosis not present

## 2019-07-25 DIAGNOSIS — Z8543 Personal history of malignant neoplasm of ovary: Secondary | ICD-10-CM | POA: Diagnosis not present

## 2019-07-25 DIAGNOSIS — J449 Chronic obstructive pulmonary disease, unspecified: Secondary | ICD-10-CM | POA: Diagnosis not present

## 2019-07-25 DIAGNOSIS — I420 Dilated cardiomyopathy: Secondary | ICD-10-CM | POA: Diagnosis not present

## 2019-07-25 DIAGNOSIS — M069 Rheumatoid arthritis, unspecified: Secondary | ICD-10-CM | POA: Diagnosis not present

## 2019-07-25 DIAGNOSIS — Z79891 Long term (current) use of opiate analgesic: Secondary | ICD-10-CM | POA: Diagnosis not present

## 2019-07-25 DIAGNOSIS — F329 Major depressive disorder, single episode, unspecified: Secondary | ICD-10-CM | POA: Diagnosis not present

## 2019-07-25 DIAGNOSIS — Z7901 Long term (current) use of anticoagulants: Secondary | ICD-10-CM | POA: Diagnosis not present

## 2019-07-25 DIAGNOSIS — E213 Hyperparathyroidism, unspecified: Secondary | ICD-10-CM | POA: Diagnosis not present

## 2019-07-25 DIAGNOSIS — M3500 Sicca syndrome, unspecified: Secondary | ICD-10-CM | POA: Diagnosis not present

## 2019-07-25 DIAGNOSIS — I110 Hypertensive heart disease with heart failure: Secondary | ICD-10-CM | POA: Diagnosis not present

## 2019-07-25 DIAGNOSIS — G8929 Other chronic pain: Secondary | ICD-10-CM | POA: Diagnosis not present

## 2019-07-25 DIAGNOSIS — J4540 Moderate persistent asthma, uncomplicated: Secondary | ICD-10-CM | POA: Diagnosis not present

## 2019-07-25 DIAGNOSIS — E559 Vitamin D deficiency, unspecified: Secondary | ICD-10-CM | POA: Diagnosis not present

## 2019-07-25 DIAGNOSIS — E039 Hypothyroidism, unspecified: Secondary | ICD-10-CM | POA: Diagnosis not present

## 2019-07-25 DIAGNOSIS — Z7951 Long term (current) use of inhaled steroids: Secondary | ICD-10-CM | POA: Diagnosis not present

## 2019-07-26 ENCOUNTER — Telehealth: Payer: Self-pay | Admitting: *Deleted

## 2019-07-26 DIAGNOSIS — I420 Dilated cardiomyopathy: Secondary | ICD-10-CM | POA: Diagnosis not present

## 2019-07-26 DIAGNOSIS — Z515 Encounter for palliative care: Secondary | ICD-10-CM | POA: Diagnosis not present

## 2019-07-26 DIAGNOSIS — M0579 Rheumatoid arthritis with rheumatoid factor of multiple sites without organ or systems involvement: Secondary | ICD-10-CM | POA: Diagnosis not present

## 2019-07-26 NOTE — Telephone Encounter (Signed)
Angela Wong from [facility] called saying pt has pitting edema in both lower extremities that goes up to her thighs. She just came home on Saturday from Rehab facility. Can we add furosemide?  She does have a TOC appt with you on Thursday

## 2019-07-26 NOTE — Telephone Encounter (Signed)
Tried to attempt call back number. They stated there was nu kimberly there had wrong number

## 2019-07-26 NOTE — Telephone Encounter (Signed)
Pt should be taking Bumex 1 mg daily. Please have her call her Cardiologists as they are managing her CHF.

## 2019-07-27 ENCOUNTER — Telehealth: Payer: Self-pay | Admitting: *Deleted

## 2019-07-27 ENCOUNTER — Telehealth: Payer: Self-pay | Admitting: Family

## 2019-07-27 DIAGNOSIS — I4821 Permanent atrial fibrillation: Secondary | ICD-10-CM

## 2019-07-27 DIAGNOSIS — I4819 Other persistent atrial fibrillation: Secondary | ICD-10-CM | POA: Diagnosis not present

## 2019-07-27 DIAGNOSIS — I420 Dilated cardiomyopathy: Secondary | ICD-10-CM | POA: Diagnosis not present

## 2019-07-27 DIAGNOSIS — I110 Hypertensive heart disease with heart failure: Secondary | ICD-10-CM | POA: Diagnosis not present

## 2019-07-27 DIAGNOSIS — I2510 Atherosclerotic heart disease of native coronary artery without angina pectoris: Secondary | ICD-10-CM | POA: Diagnosis not present

## 2019-07-27 DIAGNOSIS — I5032 Chronic diastolic (congestive) heart failure: Secondary | ICD-10-CM | POA: Diagnosis not present

## 2019-07-27 DIAGNOSIS — I214 Non-ST elevation (NSTEMI) myocardial infarction: Secondary | ICD-10-CM | POA: Diagnosis not present

## 2019-07-27 NOTE — Telephone Encounter (Signed)
Pt aware of provider recommendations and voiced understanding. 

## 2019-07-27 NOTE — Telephone Encounter (Signed)
Fax received mdINR PT/INR self testing service Test date/time 07/27/19 910am INR 3.5

## 2019-07-27 NOTE — Telephone Encounter (Signed)
TRANSITIONAL CARE MANAGEMENT TELEPHONE OUTREACH NOTE   Contact Date: 07/27/2019 Contacted By: Lynnea Ferrier, LPN   DISCHARGE INFORMATION Date of Discharge:07/24/19 Discharge Facility: [facility] in [place] Discharge Diagnosis:NSTEMI   Outpatient Follow Up Recommendations (copied from discharge summary) Follow up with PCP in 7-10days  Angela Wong is a female primary care patient of [name], FNP. An outgoing telephone call was made today and I spoke with patient.  Angela Wong condition(s) and treatment(s) were discussed. An opportunity to ask questions was provided and all were answered or forwarded as appropriate.    ACTIVITIES OF DAILY LIVING  Angela Wong lives alone and she cannot perform ADLs independently. her primary caregiver is self, and son. she is able to depend on her primary caregiver(s) for consistent help. Transportation to appointments, to pick up medications, and to run errands is a problem.  (Consider referral to [facility] if transportation or a consistent caregiver is a problem)   Fall Risk Fall Risk  11/23/2018 03/16/2018  Falls in the past year? 0 1  Comment - -  Number falls in past yr: - 1  Injury with Fall? - 1  Comment - -  Risk Factor Category  - -  Risk for fall due to : - -  Follow up - -  Comment - -    high [place] Modifications/Assistive Devices Wheelchair: No Cane: No Ramp: No Bedside Toilet: Yes Hospital Bed:  No Other: [place] she is receiving home health Physical therapy and OT services.     MEDICATION RECONCILIATION  Angela Wong has been able to pick-up all prescribed discharge medications from the pharmacy.   A post discharge medication reconciliation was performed and the complete medication list was reviewed with the patient/caregiver and is current as of 07/27/2019. Changes highlighted below.  Discontinued Medications Allergies as of 07/22/2019      Reactions    Cephalosporins Hives, Shortness Of Breath   Ciprofloxacin Hives, Shortness Of Breath   Diltiazem Shortness Of Breath   Swollen throat   Doxycycline Hives, Shortness Of Breath   Horse-derived Products Anaphylaxis   Ketek [telithromycin] Palpitations   Chest discomfort   Nitrofuran Derivatives Anaphylaxis, Hives   blisters   Nitrous Oxide Nausea And Vomiting   Severe due to Sjogrens (Auto-Immune Disease)   Other Anaphylaxis   ALLERGY TO HORSE SERUM   Penicillins Hives, Shortness Of Breath      Pentazocine Other (See Comments)   Other reaction(s): Mental Status Changes (intolerance) Altered Mental Status  Altered Mental Status  Altered Mental Status    Sulfa Antibiotics Hives, Shortness Of Breath   Trovan [alatrofloxacin] Palpitations, Other (See Comments), Anaphylaxis   Chest pain, dizziness, irregular pulse   Zinc Gelatin [zinc] Anaphylaxis   Amlodipine Swelling   Calcium Channel Blockers    Respiratory distress   Carvedilol Other (See Comments)   Dizziness, "joint pain, depression"   Clindamycin/lincomycin    CP, lock jaw   Codeine Nausea Only   Cymbalta [duloxetine Hcl] Swelling   Diovan [valsartan] Swelling   Lisinopril Swelling   Sertraline Other (See Comments)   confusion   Tramadol Nausea Only   Loteprednol Etabonate Rash      Medication List       Accurate as of July 22, 2019 11:59 PM. If you have any questions, ask your nurse or doctor.        STOP taking these medications  aspirin 81 MG EC tablet Stopped by: Kerin Ransom, PA-C   atorvastatin 40 MG tablet Commonly known as: LIPITOR Stopped by: Kerin Ransom, PA-C   HM Lidocaine Patch 4 % Ptch Generic drug: Lidocaine Stopped by: Kerin Ransom, PA-C     TAKE these medications   albuterol 108 (90 Base) MCG/ACT inhaler Commonly known as: VENTOLIN HFA INHALE 2 PUFFS BY MOUTH EVERY 6 HOURS AS NEEDED FOR WHEEZING What changed:   how much to take  how to take this  when to take this  reasons to take  this   atenolol 25 MG tablet Commonly known as: TENORMIN Take 0.5 tablets (12.5 mg total) by mouth 2 (two) times daily.   B-complex with vitamin C tablet Take 1 tablet by mouth every evening.   bumetanide 1 MG tablet Commonly known as: BUMEX Take 1 mg by mouth every other day. What changed: Another medication with the same name was removed. Continue taking this medication, and follow the directions you see here. Changed by: Kerin Ransom, PA-C   diphenhydrAMINE 25 MG tablet Commonly known as: BENADRYL Take 25 mg by mouth daily as needed for itching or allergies.   docusate sodium 100 MG capsule Commonly known as: COLACE Take 400 mg by mouth every morning.   esomeprazole 40 MG capsule Commonly known as: NEXIUM Take 1 capsule (40 mg total) by mouth 2 (two) times daily.   fexofenadine 180 MG tablet Commonly known as: ALLEGRA Take 180 mg by mouth every morning.   Gas-X Extra Strength 125 MG Caps Generic drug: Simethicone Take 2 capsules by mouth daily as needed (for gas).   levothyroxine 50 MCG tablet Commonly known as: Euthyrox Take 2 tablets (100 mcg total) by mouth every morning.   losartan 50 MG tablet Commonly known as: COZAAR Take 1 tablet (50 mg total) by mouth daily. What changed:   medication strength  how much to take Changed by: Kerin Ransom, PA-C   Magnesium 400 MG Tabs Take 400 mg by mouth every evening.   melatonin 3 MG Tabs tablet Take 3 mg by mouth at bedtime.   methocarbamol 500 MG tablet Commonly known as: ROBAXIN Take 1 tablet (500 mg total) by mouth 3 (three) times daily.   NON FORMULARY Diet: _____ Regular, __x____ NAS, _______Consistent Carbohydrate, _______NPO _____Other   Olopatadine HCl 0.2 % Soln Apply 1 drop to eye daily as needed (for allergy eye).   oxyCODONE-acetaminophen 5-325 MG tablet Commonly known as: PERCOCET/ROXICET Take 1 tablet by mouth every 6 (six) hours as needed for severe pain.   Poly-Iron 150 Forte  150-0.025-1 MG Caps Generic drug: Iron Polysacch Cmplx-B12-FA Take 1 capsule by mouth daily.   polyethylene glycol 17 g packet Commonly known as: MIRALAX / GLYCOLAX Take 17 g by mouth daily as needed for mild constipation or moderate constipation (For Constipation).   predniSONE 5 MG tablet Commonly known as: DELTASONE Take 5 mg by mouth every morning.   Restasis 0.05 % ophthalmic emulsion Generic drug: cycloSPORINE INSTILL 1 DROP INTO EACH EYE TWICE DAILY What changed: See the new instructions.   Symbicort 160-4.5 MCG/ACT inhaler Generic drug: budesonide-formoterol Inhale 2 puffs by mouth twice daily What changed: when to take this   traZODone 150 MG tablet Commonly known as: DESYREL Take 1 tablet (150 mg total) by mouth at bedtime.   Venelex Oint Apply topically in the morning, at noon, and at bedtime. Apply to left buttock qshift.   vitamin B-12 500 MCG tablet Commonly known as: CYANOCOBALAMIN Take 1  tablet (500 mcg total) by mouth daily.   Vitamin D 50 MCG (2000 UT) Caps Take 2 capsules by mouth every evening.   warfarin 1 MG tablet Commonly known as: COUMADIN Take as directed by the anticoagulation clinic. If you are unsure how to take this medication, talk to your nurse or doctor. Original instructions: Take 1 tablet (1 mg total) by mouth daily. Take 4mg  MWF and 2mg  TTSS per patient report last PTA dosing       Current Medication List Allergies as of 07/22/2019      Reactions   Cephalosporins Hives, Shortness Of Breath   Ciprofloxacin Hives, Shortness Of Breath   Diltiazem Shortness Of Breath   Swollen throat   Doxycycline Hives, Shortness Of Breath   Horse-derived Products Anaphylaxis   Ketek [telithromycin] Palpitations   Chest discomfort   Nitrofuran Derivatives Anaphylaxis, Hives   blisters   Nitrous Oxide Nausea And Vomiting   Severe due to Sjogrens (Auto-Immune Disease)   Other Anaphylaxis   ALLERGY TO HORSE SERUM   Penicillins Hives, Shortness  Of Breath      Pentazocine Other (See Comments)   Other reaction(s): Mental Status Changes (intolerance) Altered Mental Status  Altered Mental Status  Altered Mental Status    Sulfa Antibiotics Hives, Shortness Of Breath   Trovan [alatrofloxacin] Palpitations, Other (See Comments), Anaphylaxis   Chest pain, dizziness, irregular pulse   Zinc Gelatin [zinc] Anaphylaxis   Amlodipine Swelling   Calcium Channel Blockers    Respiratory distress   Carvedilol Other (See Comments)   Dizziness, "joint pain, depression"   Clindamycin/lincomycin    CP, lock jaw   Codeine Nausea Only   Cymbalta [duloxetine Hcl] Swelling   Diovan [valsartan] Swelling   Lisinopril Swelling   Sertraline Other (See Comments)   confusion   Tramadol Nausea Only   Loteprednol Etabonate Rash      Medication List       Accurate as of July 22, 2019 11:59 PM. If you have any questions, ask your nurse or doctor.        STOP taking these medications   aspirin 81 MG EC tablet Stopped by: Kerin Ransom, PA-C   atorvastatin 40 MG tablet Commonly known as: LIPITOR Stopped by: Kerin Ransom, PA-C   HM Lidocaine Patch 4 % Ptch Generic drug: Lidocaine Stopped by: Kerin Ransom, PA-C     TAKE these medications   albuterol 108 (90 Base) MCG/ACT inhaler Commonly known as: VENTOLIN HFA INHALE 2 PUFFS BY MOUTH EVERY 6 HOURS AS NEEDED FOR WHEEZING What changed:   how much to take  how to take this  when to take this  reasons to take this   atenolol 25 MG tablet Commonly known as: TENORMIN Take 0.5 tablets (12.5 mg total) by mouth 2 (two) times daily.   B-complex with vitamin C tablet Take 1 tablet by mouth every evening.   bumetanide 1 MG tablet Commonly known as: BUMEX Take 1 mg by mouth every other day. What changed: Another medication with the same name was removed. Continue taking this medication, and follow the directions you see here. Changed by: Kerin Ransom, PA-C   diphenhydrAMINE 25 MG  tablet Commonly known as: BENADRYL Take 25 mg by mouth daily as needed for itching or allergies.   docusate sodium 100 MG capsule Commonly known as: COLACE Take 400 mg by mouth every morning.   esomeprazole 40 MG capsule Commonly known as: NEXIUM Take 1 capsule (40 mg total) by mouth 2 (two) times daily.  fexofenadine 180 MG tablet Commonly known as: ALLEGRA Take 180 mg by mouth every morning.   Gas-X Extra Strength 125 MG Caps Generic drug: Simethicone Take 2 capsules by mouth daily as needed (for gas).   levothyroxine 50 MCG tablet Commonly known as: Euthyrox Take 2 tablets (100 mcg total) by mouth every morning.   losartan 50 MG tablet Commonly known as: COZAAR Take 1 tablet (50 mg total) by mouth daily. What changed:   medication strength  how much to take Changed by: Kerin Ransom, PA-C   Magnesium 400 MG Tabs Take 400 mg by mouth every evening.   melatonin 3 MG Tabs tablet Take 3 mg by mouth at bedtime.   methocarbamol 500 MG tablet Commonly known as: ROBAXIN Take 1 tablet (500 mg total) by mouth 3 (three) times daily.   NON FORMULARY Diet: _____ Regular, __x____ NAS, _______Consistent Carbohydrate, _______NPO _____Other   Olopatadine HCl 0.2 % Soln Apply 1 drop to eye daily as needed (for allergy eye).   oxyCODONE-acetaminophen 5-325 MG tablet Commonly known as: PERCOCET/ROXICET Take 1 tablet by mouth every 6 (six) hours as needed for severe pain.   Poly-Iron 150 Forte 150-0.025-1 MG Caps Generic drug: Iron Polysacch Cmplx-B12-FA Take 1 capsule by mouth daily.   polyethylene glycol 17 g packet Commonly known as: MIRALAX / GLYCOLAX Take 17 g by mouth daily as needed for mild constipation or moderate constipation (For Constipation).   predniSONE 5 MG tablet Commonly known as: DELTASONE Take 5 mg by mouth every morning.   Restasis 0.05 % ophthalmic emulsion Generic drug: cycloSPORINE INSTILL 1 DROP INTO EACH EYE TWICE DAILY What changed: See  the new instructions.   Symbicort 160-4.5 MCG/ACT inhaler Generic drug: budesonide-formoterol Inhale 2 puffs by mouth twice daily What changed: when to take this   traZODone 150 MG tablet Commonly known as: DESYREL Take 1 tablet (150 mg total) by mouth at bedtime.   Venelex Oint Apply topically in the morning, at noon, and at bedtime. Apply to left buttock qshift.   vitamin B-12 500 MCG tablet Commonly known as: CYANOCOBALAMIN Take 1 tablet (500 mcg total) by mouth daily.   Vitamin D 50 MCG (2000 UT) Caps Take 2 capsules by mouth every evening.   warfarin 1 MG tablet Commonly known as: COUMADIN Take as directed by the anticoagulation clinic. If you are unsure how to take this medication, talk to your nurse or doctor. Original instructions: Take 1 tablet (1 mg total) by mouth daily. Take 4mg  MWF and 2mg  TTSS per patient report last PTA dosing      Furosemide 20mg  BID  PATIENT EDUCATION & FOLLOW-UP PLAN  An appointment for Transitional Care Management is scheduled with [name], FNP on 07/29/19 at 10:25am.  Take all medications as prescribed  Contact our office by calling [phone] if you have any questions or concerns

## 2019-07-27 NOTE — Telephone Encounter (Signed)
INR was 3.5 today (goal is 2.0 to 3.0) Too thin  Hold today's dose (07/15/19), Then 2 mg on Sunday, Tuesday, Wednesday,  Thursday and Saturday. Then  4 mg on  Monday and Friday.

## 2019-07-28 DIAGNOSIS — I5032 Chronic diastolic (congestive) heart failure: Secondary | ICD-10-CM | POA: Diagnosis not present

## 2019-07-28 DIAGNOSIS — I4819 Other persistent atrial fibrillation: Secondary | ICD-10-CM | POA: Diagnosis not present

## 2019-07-28 DIAGNOSIS — I110 Hypertensive heart disease with heart failure: Secondary | ICD-10-CM | POA: Diagnosis not present

## 2019-07-28 DIAGNOSIS — I420 Dilated cardiomyopathy: Secondary | ICD-10-CM | POA: Diagnosis not present

## 2019-07-28 DIAGNOSIS — I2510 Atherosclerotic heart disease of native coronary artery without angina pectoris: Secondary | ICD-10-CM | POA: Diagnosis not present

## 2019-07-28 DIAGNOSIS — I214 Non-ST elevation (NSTEMI) myocardial infarction: Secondary | ICD-10-CM | POA: Diagnosis not present

## 2019-07-29 ENCOUNTER — Ambulatory Visit (INDEPENDENT_AMBULATORY_CARE_PROVIDER_SITE_OTHER): Payer: Medicare Other

## 2019-07-29 ENCOUNTER — Other Ambulatory Visit: Payer: Self-pay

## 2019-07-29 ENCOUNTER — Ambulatory Visit: Payer: Medicare Other | Admitting: Family

## 2019-07-29 DIAGNOSIS — M069 Rheumatoid arthritis, unspecified: Secondary | ICD-10-CM | POA: Diagnosis not present

## 2019-07-29 DIAGNOSIS — I420 Dilated cardiomyopathy: Secondary | ICD-10-CM | POA: Diagnosis not present

## 2019-07-29 DIAGNOSIS — Z9181 History of falling: Secondary | ICD-10-CM

## 2019-07-29 DIAGNOSIS — I472 Ventricular tachycardia: Secondary | ICD-10-CM | POA: Diagnosis not present

## 2019-07-29 DIAGNOSIS — F329 Major depressive disorder, single episode, unspecified: Secondary | ICD-10-CM

## 2019-07-29 DIAGNOSIS — I214 Non-ST elevation (NSTEMI) myocardial infarction: Secondary | ICD-10-CM

## 2019-07-29 DIAGNOSIS — E559 Vitamin D deficiency, unspecified: Secondary | ICD-10-CM

## 2019-07-29 DIAGNOSIS — E213 Hyperparathyroidism, unspecified: Secondary | ICD-10-CM

## 2019-07-29 DIAGNOSIS — J4540 Moderate persistent asthma, uncomplicated: Secondary | ICD-10-CM | POA: Diagnosis not present

## 2019-07-29 DIAGNOSIS — I5032 Chronic diastolic (congestive) heart failure: Secondary | ICD-10-CM

## 2019-07-29 DIAGNOSIS — Z79891 Long term (current) use of opiate analgesic: Secondary | ICD-10-CM

## 2019-07-29 DIAGNOSIS — Z8543 Personal history of malignant neoplasm of ovary: Secondary | ICD-10-CM

## 2019-07-29 DIAGNOSIS — I495 Sick sinus syndrome: Secondary | ICD-10-CM | POA: Diagnosis not present

## 2019-07-29 DIAGNOSIS — M4800 Spinal stenosis, site unspecified: Secondary | ICD-10-CM | POA: Diagnosis not present

## 2019-07-29 DIAGNOSIS — M3500 Sicca syndrome, unspecified: Secondary | ICD-10-CM | POA: Diagnosis not present

## 2019-07-29 DIAGNOSIS — I4821 Permanent atrial fibrillation: Secondary | ICD-10-CM | POA: Diagnosis not present

## 2019-07-29 DIAGNOSIS — I4819 Other persistent atrial fibrillation: Secondary | ICD-10-CM | POA: Diagnosis not present

## 2019-07-29 DIAGNOSIS — I7300 Raynaud's syndrome without gangrene: Secondary | ICD-10-CM

## 2019-07-29 DIAGNOSIS — I110 Hypertensive heart disease with heart failure: Secondary | ICD-10-CM | POA: Diagnosis not present

## 2019-07-29 DIAGNOSIS — G8929 Other chronic pain: Secondary | ICD-10-CM

## 2019-07-29 DIAGNOSIS — I2510 Atherosclerotic heart disease of native coronary artery without angina pectoris: Secondary | ICD-10-CM

## 2019-07-29 DIAGNOSIS — J449 Chronic obstructive pulmonary disease, unspecified: Secondary | ICD-10-CM | POA: Diagnosis not present

## 2019-07-29 DIAGNOSIS — M797 Fibromyalgia: Secondary | ICD-10-CM

## 2019-07-29 DIAGNOSIS — K219 Gastro-esophageal reflux disease without esophagitis: Secondary | ICD-10-CM

## 2019-07-29 DIAGNOSIS — Z7901 Long term (current) use of anticoagulants: Secondary | ICD-10-CM

## 2019-07-29 DIAGNOSIS — Z7952 Long term (current) use of systemic steroids: Secondary | ICD-10-CM

## 2019-07-29 DIAGNOSIS — E039 Hypothyroidism, unspecified: Secondary | ICD-10-CM

## 2019-07-29 DIAGNOSIS — I10 Essential (primary) hypertension: Secondary | ICD-10-CM | POA: Diagnosis not present

## 2019-07-29 DIAGNOSIS — Z7951 Long term (current) use of inhaled steroids: Secondary | ICD-10-CM

## 2019-07-30 DIAGNOSIS — I5032 Chronic diastolic (congestive) heart failure: Secondary | ICD-10-CM | POA: Diagnosis not present

## 2019-07-30 DIAGNOSIS — I214 Non-ST elevation (NSTEMI) myocardial infarction: Secondary | ICD-10-CM | POA: Diagnosis not present

## 2019-07-30 DIAGNOSIS — I4819 Other persistent atrial fibrillation: Secondary | ICD-10-CM | POA: Diagnosis not present

## 2019-07-30 DIAGNOSIS — I110 Hypertensive heart disease with heart failure: Secondary | ICD-10-CM | POA: Diagnosis not present

## 2019-07-30 DIAGNOSIS — I420 Dilated cardiomyopathy: Secondary | ICD-10-CM | POA: Diagnosis not present

## 2019-07-30 DIAGNOSIS — I2510 Atherosclerotic heart disease of native coronary artery without angina pectoris: Secondary | ICD-10-CM | POA: Diagnosis not present

## 2019-08-02 DIAGNOSIS — Z515 Encounter for palliative care: Secondary | ICD-10-CM | POA: Diagnosis not present

## 2019-08-02 DIAGNOSIS — I4819 Other persistent atrial fibrillation: Secondary | ICD-10-CM | POA: Diagnosis not present

## 2019-08-02 DIAGNOSIS — I2510 Atherosclerotic heart disease of native coronary artery without angina pectoris: Secondary | ICD-10-CM | POA: Diagnosis not present

## 2019-08-02 DIAGNOSIS — I420 Dilated cardiomyopathy: Secondary | ICD-10-CM | POA: Diagnosis not present

## 2019-08-02 DIAGNOSIS — I214 Non-ST elevation (NSTEMI) myocardial infarction: Secondary | ICD-10-CM | POA: Diagnosis not present

## 2019-08-02 DIAGNOSIS — I5032 Chronic diastolic (congestive) heart failure: Secondary | ICD-10-CM | POA: Diagnosis not present

## 2019-08-02 DIAGNOSIS — K648 Other hemorrhoids: Secondary | ICD-10-CM | POA: Diagnosis not present

## 2019-08-02 DIAGNOSIS — I110 Hypertensive heart disease with heart failure: Secondary | ICD-10-CM | POA: Diagnosis not present

## 2019-08-02 DIAGNOSIS — M0579 Rheumatoid arthritis with rheumatoid factor of multiple sites without organ or systems involvement: Secondary | ICD-10-CM | POA: Diagnosis not present

## 2019-08-03 ENCOUNTER — Telehealth: Payer: Self-pay | Admitting: Family

## 2019-08-03 DIAGNOSIS — I110 Hypertensive heart disease with heart failure: Secondary | ICD-10-CM | POA: Diagnosis not present

## 2019-08-03 DIAGNOSIS — I4821 Permanent atrial fibrillation: Secondary | ICD-10-CM | POA: Diagnosis not present

## 2019-08-03 DIAGNOSIS — I5032 Chronic diastolic (congestive) heart failure: Secondary | ICD-10-CM | POA: Diagnosis not present

## 2019-08-03 DIAGNOSIS — I2510 Atherosclerotic heart disease of native coronary artery without angina pectoris: Secondary | ICD-10-CM | POA: Diagnosis not present

## 2019-08-03 DIAGNOSIS — I214 Non-ST elevation (NSTEMI) myocardial infarction: Secondary | ICD-10-CM | POA: Diagnosis not present

## 2019-08-03 DIAGNOSIS — I420 Dilated cardiomyopathy: Secondary | ICD-10-CM | POA: Diagnosis not present

## 2019-08-03 DIAGNOSIS — I4819 Other persistent atrial fibrillation: Secondary | ICD-10-CM | POA: Diagnosis not present

## 2019-08-03 DIAGNOSIS — Z7901 Long term (current) use of anticoagulants: Secondary | ICD-10-CM | POA: Diagnosis not present

## 2019-08-03 NOTE — Telephone Encounter (Signed)
This ok, please make sure it is a 30 min appointment.

## 2019-08-03 NOTE — Telephone Encounter (Addendum)
hosp follow up appt made - TELE 30 min  Fax [facility] the lab orders that you need and they will draw Fax# [phone] Phone# [phone]  She wants b12 lab ordered as well

## 2019-08-04 DIAGNOSIS — I110 Hypertensive heart disease with heart failure: Secondary | ICD-10-CM | POA: Diagnosis not present

## 2019-08-04 DIAGNOSIS — I5032 Chronic diastolic (congestive) heart failure: Secondary | ICD-10-CM | POA: Diagnosis not present

## 2019-08-04 DIAGNOSIS — I2510 Atherosclerotic heart disease of native coronary artery without angina pectoris: Secondary | ICD-10-CM | POA: Diagnosis not present

## 2019-08-04 DIAGNOSIS — I4819 Other persistent atrial fibrillation: Secondary | ICD-10-CM | POA: Diagnosis not present

## 2019-08-04 DIAGNOSIS — I214 Non-ST elevation (NSTEMI) myocardial infarction: Secondary | ICD-10-CM | POA: Diagnosis not present

## 2019-08-04 DIAGNOSIS — I420 Dilated cardiomyopathy: Secondary | ICD-10-CM | POA: Diagnosis not present

## 2019-08-05 ENCOUNTER — Encounter: Payer: Self-pay | Admitting: Family

## 2019-08-05 ENCOUNTER — Telehealth (INDEPENDENT_AMBULATORY_CARE_PROVIDER_SITE_OTHER): Payer: Medicare Other | Admitting: Family

## 2019-08-05 DIAGNOSIS — I25119 Atherosclerotic heart disease of native coronary artery with unspecified angina pectoris: Secondary | ICD-10-CM | POA: Diagnosis not present

## 2019-08-05 DIAGNOSIS — I420 Dilated cardiomyopathy: Secondary | ICD-10-CM | POA: Diagnosis not present

## 2019-08-05 DIAGNOSIS — E039 Hypothyroidism, unspecified: Secondary | ICD-10-CM

## 2019-08-05 DIAGNOSIS — I110 Hypertensive heart disease with heart failure: Secondary | ICD-10-CM | POA: Diagnosis not present

## 2019-08-05 DIAGNOSIS — I4821 Permanent atrial fibrillation: Secondary | ICD-10-CM

## 2019-08-05 DIAGNOSIS — E538 Deficiency of other specified B group vitamins: Secondary | ICD-10-CM

## 2019-08-05 DIAGNOSIS — I214 Non-ST elevation (NSTEMI) myocardial infarction: Secondary | ICD-10-CM | POA: Diagnosis not present

## 2019-08-05 DIAGNOSIS — I2510 Atherosclerotic heart disease of native coronary artery without angina pectoris: Secondary | ICD-10-CM | POA: Diagnosis not present

## 2019-08-05 DIAGNOSIS — I5032 Chronic diastolic (congestive) heart failure: Secondary | ICD-10-CM | POA: Diagnosis not present

## 2019-08-05 DIAGNOSIS — Z09 Encounter for follow-up examination after completed treatment for conditions other than malignant neoplasm: Secondary | ICD-10-CM

## 2019-08-05 DIAGNOSIS — I4819 Other persistent atrial fibrillation: Secondary | ICD-10-CM | POA: Diagnosis not present

## 2019-08-05 MED ORDER — LEVOTHYROXINE SODIUM 50 MCG PO TABS: 100.0000 ug | ORAL_TABLET | Freq: Every morning | ORAL | 3 refills | Status: DC

## 2019-08-05 NOTE — Telephone Encounter (Signed)
Angela Wong, can we get home health to draw her lab work. Orders placed today.

## 2019-08-05 NOTE — Telephone Encounter (Signed)
RTC from [facility] They plan to see pt tomorrow and will get labwork

## 2019-08-05 NOTE — Telephone Encounter (Signed)
TC to [facility] to add lab orders to draw B12, TSH, INR, CBC & CMP

## 2019-08-05 NOTE — Progress Notes (Signed)
Virtual Visit via telephone Note Due to COVID-19 pandemic this visit was conducted virtually. This visit type was conducted due to national recommendations for restrictions regarding the COVID-19 Pandemic (e.g. social distancing, sheltering in place) in an effort to limit this patient's exposure and mitigate transmission in our community. All issues noted in this document were discussed and addressed.  A physical exam was not performed with this format.  I connected with Angela Wong on 08/05/19 at 10:38 AM by telephone and verified that I am speaking with the correct person using two identifiers. Angela Wong is currently located at home and aid is currently with her during visit. The provider, [name], FNP is located in their office at time of visit.  I discussed the limitations, risks, security and privacy concerns of performing an evaluation and management service by telephone and the availability of in person appointments. I also discussed with the patient that there may be a patient responsible charge related to this service. The patient expressed understanding and agreed to proceed.   History and Present Illness:  HPI Today's visit was for Transitional Care Management.  The patient was discharged from [facility] on 07/05/19 to [facility] in [facility] and was discharged home on  07/24/19 with a primary diagnosis of NSTEMI.   Contact with the patient and/or caregiver, by a clinical staff member, was made on 07/27/19 and was documented as a telephone encounter within the EMR.  Through chart review and discussion with the patient I have determined that management of their condition is of high complexity.   She went to the ED on 06/22/19 with CHF and was discharged on 06/24/19 to [facility]. She then was admitted to the [facility] with the NSTEMI on 07/01/19.   She had a cardiac cath on 07/02/19. She had an Echo on 06/23/19 that showed an EF of 50%  She currently  has [facility] coming three times a day.   She also has Palliative care that comes every week.   She saw her Cardiologists on 07/22/19 and decreased her losartan to 50 mg from 100 mg. Also increased her Bumex for 2 days, then continue to 1 mg daily. Also, stopped her Lasix and encouraged her a low salt diet.   States she has mild SOB with exertion. Denies any chest pain.   Her weight was 136 lb which is a 2 lb decreased from yesterday.   Review of Systems  Constitutional: Positive for malaise/fatigue.  Respiratory: Negative for shortness of breath.   Cardiovascular: Positive for leg swelling.  All other systems reviewed and are negative.    Observations/Objective: NO SOB or distress noted   Assessment and Plan: Angela Wong comes in today with chief complaint of No chief complaint on file.   Diagnosis and orders addressed:  1. Atrial fibrillation, permanent ([place]) - CMP14+EGFR - CBC with Differential/Platelet - CoaguChek XS/INR Waived  2. Coronary artery disease involving native coronary artery of native heart with angina pectoris (HCC) - CMP14+EGFR - CBC with Differential/Platelet  3. Hypothyroidism, unspecified type - levothyroxine (EUTHYROX) 50 MCG tablet; Take 2 tablets (100 mcg total) by mouth every morning.  Dispense: 180 tablet; Refill: 3 - CMP14+EGFR - CBC with Differential/Platelet - TSH  4. Hospital discharge follow-up - CMP14+EGFR - CBC with Differential/Platelet - TSH  5. Congestive dilated cardiomyopathy ([place])  6. Non-STEMI (non-ST elevated myocardial infarction) [facility])    Labs pending, will have Home Health draw labs  Continue medications  Pt sounds the best today then she has in months!  Health Maintenance reviewed Diet and exercise encouraged  Follow up plan: 3 months and keep follow up with [facility]       I discussed the assessment and treatment plan with the patient. The patient was provided an opportunity to ask questions and all  were answered. The patient agreed with the plan and demonstrated an understanding of the instructions.   The patient was advised to call back or seek an in-person evaluation if the symptoms worsen or if the condition fails to improve as anticipated.  The above assessment and management plan was discussed with the patient. The patient verbalized understanding of and has agreed to the management plan. Patient is aware to call the clinic if symptoms persist or worsen. Patient is aware when to return to the clinic for a follow-up visit. Patient educated on when it is appropriate to go to the emergency department.   Time call ended:  11:10 AM   I provided 32 minutes of non-face-to-face time during this encounter.    [name], FNP

## 2019-08-05 NOTE — Addendum Note (Signed)
Addended by: [name] A on: 08/05/2019 03:32 PM   Modules accepted: Orders

## 2019-08-06 ENCOUNTER — Other Ambulatory Visit: Payer: Medicare Other

## 2019-08-06 ENCOUNTER — Telehealth: Payer: Self-pay | Admitting: Family

## 2019-08-06 ENCOUNTER — Other Ambulatory Visit: Payer: Self-pay

## 2019-08-06 DIAGNOSIS — E213 Hyperparathyroidism, unspecified: Secondary | ICD-10-CM | POA: Diagnosis not present

## 2019-08-06 DIAGNOSIS — I2510 Atherosclerotic heart disease of native coronary artery without angina pectoris: Secondary | ICD-10-CM | POA: Diagnosis not present

## 2019-08-06 DIAGNOSIS — I420 Dilated cardiomyopathy: Secondary | ICD-10-CM | POA: Diagnosis not present

## 2019-08-06 DIAGNOSIS — I4819 Other persistent atrial fibrillation: Secondary | ICD-10-CM | POA: Diagnosis not present

## 2019-08-06 DIAGNOSIS — Z7901 Long term (current) use of anticoagulants: Secondary | ICD-10-CM | POA: Diagnosis not present

## 2019-08-06 DIAGNOSIS — I214 Non-ST elevation (NSTEMI) myocardial infarction: Secondary | ICD-10-CM | POA: Diagnosis not present

## 2019-08-06 DIAGNOSIS — I110 Hypertensive heart disease with heart failure: Secondary | ICD-10-CM | POA: Diagnosis not present

## 2019-08-06 DIAGNOSIS — I5032 Chronic diastolic (congestive) heart failure: Secondary | ICD-10-CM | POA: Diagnosis not present

## 2019-08-06 DIAGNOSIS — E559 Vitamin D deficiency, unspecified: Secondary | ICD-10-CM | POA: Diagnosis not present

## 2019-08-06 NOTE — Telephone Encounter (Signed)
Joelene Millin (LPN from [facility]) came in to office to drop off some blood work ordered by [employer] for patient. Joelene Millin said she was unable to get INR on pt due to expired blue cap but says pt gets her INR checked weekly and last INR was 2.6.. Says [name] can call her if she has questions or still wants INR rechecked.

## 2019-08-09 DIAGNOSIS — K5900 Constipation, unspecified: Secondary | ICD-10-CM | POA: Diagnosis not present

## 2019-08-09 DIAGNOSIS — Z515 Encounter for palliative care: Secondary | ICD-10-CM | POA: Diagnosis not present

## 2019-08-09 DIAGNOSIS — I420 Dilated cardiomyopathy: Secondary | ICD-10-CM | POA: Diagnosis not present

## 2019-08-10 DIAGNOSIS — I2510 Atherosclerotic heart disease of native coronary artery without angina pectoris: Secondary | ICD-10-CM | POA: Diagnosis not present

## 2019-08-10 DIAGNOSIS — I4819 Other persistent atrial fibrillation: Secondary | ICD-10-CM | POA: Diagnosis not present

## 2019-08-10 DIAGNOSIS — I110 Hypertensive heart disease with heart failure: Secondary | ICD-10-CM | POA: Diagnosis not present

## 2019-08-10 DIAGNOSIS — I214 Non-ST elevation (NSTEMI) myocardial infarction: Secondary | ICD-10-CM | POA: Diagnosis not present

## 2019-08-10 DIAGNOSIS — I5032 Chronic diastolic (congestive) heart failure: Secondary | ICD-10-CM | POA: Diagnosis not present

## 2019-08-10 DIAGNOSIS — I420 Dilated cardiomyopathy: Secondary | ICD-10-CM | POA: Diagnosis not present

## 2019-08-11 DIAGNOSIS — I5032 Chronic diastolic (congestive) heart failure: Secondary | ICD-10-CM | POA: Diagnosis not present

## 2019-08-11 DIAGNOSIS — I2510 Atherosclerotic heart disease of native coronary artery without angina pectoris: Secondary | ICD-10-CM | POA: Diagnosis not present

## 2019-08-11 DIAGNOSIS — I420 Dilated cardiomyopathy: Secondary | ICD-10-CM | POA: Diagnosis not present

## 2019-08-11 DIAGNOSIS — I110 Hypertensive heart disease with heart failure: Secondary | ICD-10-CM | POA: Diagnosis not present

## 2019-08-11 DIAGNOSIS — I4819 Other persistent atrial fibrillation: Secondary | ICD-10-CM | POA: Diagnosis not present

## 2019-08-11 DIAGNOSIS — I214 Non-ST elevation (NSTEMI) myocardial infarction: Secondary | ICD-10-CM | POA: Diagnosis not present

## 2019-08-13 ENCOUNTER — Other Ambulatory Visit: Payer: Medicare Other

## 2019-08-13 ENCOUNTER — Other Ambulatory Visit: Payer: Self-pay

## 2019-08-13 DIAGNOSIS — E559 Vitamin D deficiency, unspecified: Secondary | ICD-10-CM | POA: Diagnosis not present

## 2019-08-13 DIAGNOSIS — I2510 Atherosclerotic heart disease of native coronary artery without angina pectoris: Secondary | ICD-10-CM | POA: Diagnosis not present

## 2019-08-13 DIAGNOSIS — Z7901 Long term (current) use of anticoagulants: Secondary | ICD-10-CM | POA: Diagnosis not present

## 2019-08-13 DIAGNOSIS — E213 Hyperparathyroidism, unspecified: Secondary | ICD-10-CM | POA: Diagnosis not present

## 2019-08-13 DIAGNOSIS — I214 Non-ST elevation (NSTEMI) myocardial infarction: Secondary | ICD-10-CM | POA: Diagnosis not present

## 2019-08-13 DIAGNOSIS — I110 Hypertensive heart disease with heart failure: Secondary | ICD-10-CM | POA: Diagnosis not present

## 2019-08-13 DIAGNOSIS — I5032 Chronic diastolic (congestive) heart failure: Secondary | ICD-10-CM | POA: Diagnosis not present

## 2019-08-13 DIAGNOSIS — I4819 Other persistent atrial fibrillation: Secondary | ICD-10-CM | POA: Diagnosis not present

## 2019-08-13 DIAGNOSIS — I420 Dilated cardiomyopathy: Secondary | ICD-10-CM | POA: Diagnosis not present

## 2019-08-17 DIAGNOSIS — I420 Dilated cardiomyopathy: Secondary | ICD-10-CM | POA: Diagnosis not present

## 2019-08-17 DIAGNOSIS — M0579 Rheumatoid arthritis with rheumatoid factor of multiple sites without organ or systems involvement: Secondary | ICD-10-CM | POA: Diagnosis not present

## 2019-08-17 DIAGNOSIS — Z515 Encounter for palliative care: Secondary | ICD-10-CM | POA: Diagnosis not present

## 2019-08-18 ENCOUNTER — Ambulatory Visit (INDEPENDENT_AMBULATORY_CARE_PROVIDER_SITE_OTHER): Payer: Medicare Other | Admitting: *Deleted

## 2019-08-18 ENCOUNTER — Telehealth: Payer: Self-pay | Admitting: Family

## 2019-08-18 ENCOUNTER — Telehealth: Payer: Self-pay | Admitting: *Deleted

## 2019-08-18 DIAGNOSIS — I4819 Other persistent atrial fibrillation: Secondary | ICD-10-CM | POA: Diagnosis not present

## 2019-08-18 DIAGNOSIS — I5032 Chronic diastolic (congestive) heart failure: Secondary | ICD-10-CM | POA: Diagnosis not present

## 2019-08-18 DIAGNOSIS — I509 Heart failure, unspecified: Secondary | ICD-10-CM

## 2019-08-18 DIAGNOSIS — I110 Hypertensive heart disease with heart failure: Secondary | ICD-10-CM | POA: Diagnosis not present

## 2019-08-18 DIAGNOSIS — I4821 Permanent atrial fibrillation: Secondary | ICD-10-CM

## 2019-08-18 DIAGNOSIS — Z748 Other problems related to care provider dependency: Secondary | ICD-10-CM

## 2019-08-18 DIAGNOSIS — I420 Dilated cardiomyopathy: Secondary | ICD-10-CM | POA: Diagnosis not present

## 2019-08-18 DIAGNOSIS — R531 Weakness: Secondary | ICD-10-CM

## 2019-08-18 DIAGNOSIS — M0579 Rheumatoid arthritis with rheumatoid factor of multiple sites without organ or systems involvement: Secondary | ICD-10-CM | POA: Diagnosis not present

## 2019-08-18 DIAGNOSIS — I2510 Atherosclerotic heart disease of native coronary artery without angina pectoris: Secondary | ICD-10-CM | POA: Diagnosis not present

## 2019-08-18 DIAGNOSIS — I214 Non-ST elevation (NSTEMI) myocardial infarction: Secondary | ICD-10-CM | POA: Diagnosis not present

## 2019-08-18 NOTE — Telephone Encounter (Signed)
Indication: afib Goal INR: 2-3 Current regimen: MF 4mg , 2mg  all other days.  Recommendations:  Increase to 4mg  MWF, 2mg  all other days. Recheck in 1 week

## 2019-08-18 NOTE — Telephone Encounter (Signed)
Fax received [facility] PT/INR self testing service Test date/time 08/17/19 934 am INR 1.9

## 2019-08-18 NOTE — Telephone Encounter (Signed)
Spoke with representative from [facility], she stated that they do not travel outside of [place] or [place]. She also stated that they only accept Medicaid not Medicare, and that the patient may have to pay between $2 to $3 for a ride. They do not provide private rides, only rides with vans or buses and if the patient has a wheelchair it would have to be something that can be attached to the front of the bus or van. Recommended for patient to call [employer] in [place].

## 2019-08-18 NOTE — Chronic Care Management (AMB) (Addendum)
Chronic Care Management   Follow Up Note   08/18/2019 Name: Angela Wong MRN: [id number] DOB: 1940-07-16  Referred by: [name], FNP Reason for referral : Chronic Care Management (RN follow up)   Angela Wong is a 79 y.o. year old female who is a primary care patient of [name], FNP. The CCM team was consulted for assistance with chronic disease management and care coordination needs.    Review of patient status, including review of consultants reports, relevant laboratory and other test results, and collaboration with appropriate care team members and the patient's provider was performed as part of comprehensive patient evaluation and provision of chronic care management services.    SDOH (Social Determinants of Health) assessments performed: Yes: transportation See Care Plan activities for detailed interventions related to [facility])      Outpatient Encounter Medications as of 08/18/2019  Medication Sig   albuterol (VENTOLIN HFA) 108 (90 Base) MCG/ACT inhaler INHALE 2 PUFFS BY MOUTH EVERY 6 HOURS AS NEEDED FOR WHEEZING (Patient taking differently: Inhale 2 puffs into the lungs every 6 (six) hours as needed for wheezing or shortness of breath. INHALE 2 PUFFS BY MOUTH EVERY 6 HOURS AS NEEDED FOR WHEEZING)   atenolol (TENORMIN) 25 MG tablet Take 0.5 tablets (12.5 mg total) by mouth 2 (two) times daily.   B Complex-C (B-COMPLEX WITH VITAMIN C) tablet Take 1 tablet by mouth every evening.    Balsam [place]-Castor Oil (VENELEX) OINT Apply topically in the morning, at noon, and at bedtime. Apply to left buttock qshift.    bumetanide (BUMEX) 1 MG tablet Take 1 mg by mouth every other day.   Cholecalciferol (VITAMIN D) 2000 UNITS CAPS Take 2 capsules by mouth every evening.    diphenhydrAMINE (BENADRYL) 25 MG tablet Take 25 mg by mouth daily as needed for itching or allergies.    docusate sodium (COLACE) 100 MG capsule Take 400 mg by mouth every morning.    esomeprazole (NEXIUM) 40  MG capsule Take 1 capsule (40 mg total) by mouth 2 (two) times daily.   fexofenadine (ALLEGRA) 180 MG tablet Take 180 mg by mouth every morning.    Iron Polysacch Cmplx-B12-FA (POLY-IRON 150 FORTE) 150-0.025-1 MG CAPS Take 1 capsule by mouth daily.   levothyroxine (EUTHYROX) 50 MCG tablet Take 2 tablets (100 mcg total) by mouth every morning.   losartan (COZAAR) 50 MG tablet Take 1 tablet (50 mg total) by mouth daily.   Magnesium 400 MG TABS Take 400 mg by mouth every evening.    melatonin 3 MG TABS tablet Take 3 mg by mouth at bedtime.   methocarbamol (ROBAXIN) 500 MG tablet Take 1 tablet (500 mg total) by mouth 3 (three) times daily.   Olopatadine HCl 0.2 % SOLN Apply 1 drop to eye daily as needed (for allergy eye).   ondansetron (ZOFRAN) 4 MG tablet Take 1 tablet (4 mg total) by mouth every 8 (eight) hours as needed for nausea or vomiting.   oxyCODONE-acetaminophen (PERCOCET/ROXICET) 5-325 MG tablet Take 1 tablet by mouth every 6 (six) hours as needed for severe pain.   polyethylene glycol (MIRALAX / GLYCOLAX) 17 g packet Take 17 g by mouth daily as needed for mild constipation or moderate constipation (For Constipation).    predniSONE (DELTASONE) 5 MG tablet Take 5 mg by mouth every morning.   RESTASIS 0.05 % ophthalmic emulsion INSTILL 1 DROP INTO EACH EYE TWICE DAILY (Patient taking differently: Place 1 drop into both eyes 2 (two) times daily. )  Simethicone (GAS-X EXTRA STRENGTH) 125 MG CAPS Take 2 capsules by mouth daily as needed (for gas).    SYMBICORT 160-4.5 MCG/ACT inhaler Inhale 2 puffs by mouth twice daily (Patient taking differently: Inhale 2 puffs into the lungs in the morning and at bedtime. )   traZODone (DESYREL) 150 MG tablet Take 1 tablet (150 mg total) by mouth at bedtime.   vitamin B-12 (CYANOCOBALAMIN) 500 MCG tablet Take 1 tablet (500 mcg total) by mouth daily.   warfarin (COUMADIN) 1 MG tablet Take 1 tablet (1 mg total) by mouth daily. Take 4mg  MWF and 2mg  TTSS per  patient report last PTA dosing   No facility-administered encounter medications on file as of 08/18/2019.      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  Nurse Case Manager Clinical Goal(s):  Over the next 10 days, patient will talk with [place] regarding transportation assistance  Interventions:  Inter-disciplinary care team collaboration (see longitudinal plan of care) Chart reviewed including recent office notes and lab results Consulted by Victorino December, NP with [place] [facility] Talked with patient by telephone Discussed transportation limitations Discussed family involvement They assist with transportation when able but they aren't always available Discussed physical mobility limitations Discussed use of lightweight wheelchair and walker Discussed generalized weakness and patient's fear of falling Discussed upcoming appointments Allergist in [place] on 08/25/19 Referral to Care Guide for transportation assistance Previously provided with [facility] contact information and encouraged to reach out as needed  Patient Self Care Activities:  Performs most ADLs independently with some assistance Unable to independently drive  Please see past updates related to this goal by clicking on the "Past Updates" button in the selected goal        "I need to get my lab results" (pt-stated)        [place] (see longitudinal plan of care for additional care plan  information)  Current Barriers:  Care Coordination needs related to lab results in a patient with Afib, HTN, HLD, heart failure, and RA (disease states)  Nurse Case Manager Clinical Goal(s):  Over the next 3 days, patient will talk with [facility] clinical staff regarding lab results and provider recommendations  Interventions:  Inter-disciplinary care team collaboration (see longitudinal plan of care) Chart reviewed including recent office notes and lab results Labs ordered by PCP during video visit on 08/05/19 but not been resulted Talked with patient by telephone [employer] [employer] drew blood on 08/13/19 for ordered lab tests Patient reported INR results to [place] through [place] yesterday She has not gotten a call about either of these Collaborated with [facility] clinical staff regarding results from [place] and [place]. Results have not came across the fax Contacted [facility]INR at [phone] Given verbal report that INR was 1.9 yesterday They faxed results over to PCP office while I was on the phone Confirmed with [facility], LPN that results were received and entered into EMR and forwarded to PCP covering provider to be addressed today Contacted [facility] at [phone] and requested that they fax results to PCP Advised PCP's nurse, Brynda Peon, CMA that  results should be coming over today Advised patient that PCP is off today but the results have been requested and she should get a call within the next day or two Encouraged patient to reach out to PCP office if she has not received results over the next 2 days Previously provided with RNCM contact number and encouraged to reach out as needed  Patient Self Care Activities:  Performs ADL's independently Does not drive  Initial goal documentation'          Plan:   The care management team will reach out to the patient again over the next 30 days.    [name], BSN, RN-BC Embedded Chronic Care Manager [facility] [place] [facility] Direct Dial: [phone]     I have reviewed the CCM documentation and agree with the written assessment and plan of care.  [name], FNP

## 2019-08-18 NOTE — Telephone Encounter (Signed)
   SF 08/18/2019   Name: Angela Wong   MRN: [id number]   DOB: [birth year]   AGE: 79 y.o.   GENDER: female   PCP [name], FNP.   Called pt regarding [employer] Referral for transportation. Patient stated that she has already made arrangements for her appointment on August 25, 2019 to the allergist. Patient stated that she needs to find transportation for her future appointments that may be in [place], [place], or [place] [employer].  [name] stated that since she has not received her COVID shot and due to her medical conditions she cannot be around a group of people at one time. She will need a private transportation. She also stated she has light- transport wheelchair/walker and she has an aide that will be with her as well. Care Guide will try to find resources for patient that are beneficial to her medical needs.    [place], Care Management Phone: [phone] Email: [email or web address]

## 2019-08-18 NOTE — Telephone Encounter (Signed)
PS already spoke to pt.  She is aware of recs

## 2019-08-18 NOTE — Telephone Encounter (Signed)
Spoke with Lovey Newcomer at [employer]. She stated that their organization provides private rides for patients. She stated that they do travel outside of [facility]. The cost would be $85 for [place] and $160 for [place]. Sandy recommended that patient give her insurance a call to see if they will provide assistance for paying for transportation services. She also stated that when the patient is ready to schedule to give them a call and provide the necessary information to book a ride.

## 2019-08-18 NOTE — Patient Instructions (Signed)
Visit Information  Goals Addressed              This Visit's Progress     Patient Stated   .  "I need help with Transportation" (pt-stated)        Angela Wong (see longitudinal plan of care for additional care plan information) Transportation assistance needs in a patient with Fibromyalgia, generalized weakness, Rheumatoid arthritis,osteoarthritis,Depression, Raynaud's disease, Sjogren's syndrome, afib, asthma, and hypothyroidism  Current Barriers:  Marland Kitchen Knowledge Deficits related to transportation assistance . Lacks caregiver support.  . Film/video editor.  . Transportation barriers  Nurse Case Manager Clinical Goal(s):  Marland Kitchen Over the next 10 days, patient will talk with [place] regarding transportation assistance  Interventions:  . Inter-disciplinary care team collaboration (see longitudinal plan of care) . Chart reviewed including recent office notes and lab results . Consulted by Victorino December, NP with [facility] . Talked with patient by telephone . Discussed transportation limitations . Discussed family involvement o They assist with transportation when able but they aren't always available . Discussed physical mobility limitations . Discussed use of lightweight wheelchair and walker . Discussed generalized weakness and patient's fear of falling . Discussed upcoming appointments o Allergist in [place] on 08/25/19 . Referral to Care Guide for transportation assistance . Previously provided with [facility] contact information and encouraged to reach out as needed  Patient Self Care Activities:  . Performs most ADLs independently with some assistance . Unable to independently drive  Please see past updates related to this goal by clicking on the "Past Updates" button in the selected goal      .  "I need to get my lab results" (pt-stated)        Angela Wong (see longitudinal plan of care for additional care plan  information)  Current Barriers:  . Care Coordination needs related to lab results in a patient with Afib, HTN, HLD, heart failure, and RA (disease states)  Nurse Case Manager Clinical Goal(s):  Marland Kitchen Over the next 3 days, patient will talk with [facility] clinical staff regarding lab results and provider recommendations  Interventions:  . Inter-disciplinary care team collaboration (see longitudinal plan of care) . Chart reviewed including recent office notes and lab results . Labs ordered by PCP during video visit on 08/05/19 but not been resulted . Talked with patient by telephone o [facility] drew blood on 08/13/19 for ordered lab tests o Patient reported INR results to [facility] through [place] yesterday o She has not gotten a call about either of these . Collaborated with [facility] clinical staff regarding results from [place] and [place]. Results have not came across the fax . Contacted [facility]IN[facility] at [phone] o Given verbal report that INR was 1.9 yesterday o They faxed results over to PCP office while I was on the phone . Confirmed with [employer], LPN that results were received and entered into EMR and forwarded to PCP covering provider to be addressed today . Contacted [facility] at [phone] and requested that they fax results to PCP . Advised PCP's nurse, Brynda Peon, CMA that results should be coming over today . Advised patient that PCP is off today but the results have been requested and she should get a call within the next day or two . Encouraged patient to reach out to PCP office if she has not received results over the next 2 days . Previously provided with RNCM contact number and encouraged to reach out as needed  Patient Self Care Activities:  . Performs ADL's independently . Does not drive  Initial goal documentation'        Patient verbalizes understanding of instructions provided today.   Follow-up Plan The care management team will reach out to the patient again  over the next 30 days.   [name], BSN, RN-BC Embedded Chronic Care Manager [facility] [place] [facility] Direct Dial: [phone]

## 2019-08-20 DIAGNOSIS — I2510 Atherosclerotic heart disease of native coronary artery without angina pectoris: Secondary | ICD-10-CM | POA: Diagnosis not present

## 2019-08-20 DIAGNOSIS — I214 Non-ST elevation (NSTEMI) myocardial infarction: Secondary | ICD-10-CM | POA: Diagnosis not present

## 2019-08-20 DIAGNOSIS — I420 Dilated cardiomyopathy: Secondary | ICD-10-CM | POA: Diagnosis not present

## 2019-08-20 DIAGNOSIS — I4819 Other persistent atrial fibrillation: Secondary | ICD-10-CM | POA: Diagnosis not present

## 2019-08-20 DIAGNOSIS — I5032 Chronic diastolic (congestive) heart failure: Secondary | ICD-10-CM | POA: Diagnosis not present

## 2019-08-20 DIAGNOSIS — I110 Hypertensive heart disease with heart failure: Secondary | ICD-10-CM | POA: Diagnosis not present

## 2019-08-20 NOTE — Telephone Encounter (Signed)
   SF 08/20/2019   Name: Angela Wong   MRN: [id number]   DOB: 1940-03-07   AGE: 79 y.o.   GENDER: female   PCP [name], FNP.   Spoke with patient regarding referral for transportation. Informed patient that [facility] does not provide the type of transportation she needs at this time. Also let the the patient know about [employer] and that they can provide the type of transportation she needs, but it is expensive. Patient stated that a couple of months ago she was set up with [place] to provide assistance with her future appointments. Informed patient that Care Guide will look into [place] and let her know if this resource can benefit her at this time. Patient stated understanding.   Follow up on: 08/23/19  [place], Care Management Phone: [phone] Email: [email or web address]

## 2019-08-23 DIAGNOSIS — I214 Non-ST elevation (NSTEMI) myocardial infarction: Secondary | ICD-10-CM | POA: Diagnosis not present

## 2019-08-23 DIAGNOSIS — M0579 Rheumatoid arthritis with rheumatoid factor of multiple sites without organ or systems involvement: Secondary | ICD-10-CM | POA: Diagnosis not present

## 2019-08-23 DIAGNOSIS — I420 Dilated cardiomyopathy: Secondary | ICD-10-CM | POA: Diagnosis not present

## 2019-08-23 DIAGNOSIS — I110 Hypertensive heart disease with heart failure: Secondary | ICD-10-CM | POA: Diagnosis not present

## 2019-08-23 DIAGNOSIS — I5032 Chronic diastolic (congestive) heart failure: Secondary | ICD-10-CM | POA: Diagnosis not present

## 2019-08-23 DIAGNOSIS — Z515 Encounter for palliative care: Secondary | ICD-10-CM | POA: Diagnosis not present

## 2019-08-23 DIAGNOSIS — I2510 Atherosclerotic heart disease of native coronary artery without angina pectoris: Secondary | ICD-10-CM | POA: Diagnosis not present

## 2019-08-23 DIAGNOSIS — I4819 Other persistent atrial fibrillation: Secondary | ICD-10-CM | POA: Diagnosis not present

## 2019-08-24 DIAGNOSIS — J449 Chronic obstructive pulmonary disease, unspecified: Secondary | ICD-10-CM | POA: Diagnosis not present

## 2019-08-24 DIAGNOSIS — M797 Fibromyalgia: Secondary | ICD-10-CM | POA: Diagnosis not present

## 2019-08-24 DIAGNOSIS — Z79891 Long term (current) use of opiate analgesic: Secondary | ICD-10-CM | POA: Diagnosis not present

## 2019-08-24 DIAGNOSIS — I420 Dilated cardiomyopathy: Secondary | ICD-10-CM | POA: Diagnosis not present

## 2019-08-24 DIAGNOSIS — Z9181 History of falling: Secondary | ICD-10-CM | POA: Diagnosis not present

## 2019-08-24 DIAGNOSIS — Z8543 Personal history of malignant neoplasm of ovary: Secondary | ICD-10-CM | POA: Diagnosis not present

## 2019-08-24 DIAGNOSIS — I7300 Raynaud's syndrome without gangrene: Secondary | ICD-10-CM | POA: Diagnosis not present

## 2019-08-24 DIAGNOSIS — M3500 Sicca syndrome, unspecified: Secondary | ICD-10-CM | POA: Diagnosis not present

## 2019-08-24 DIAGNOSIS — E039 Hypothyroidism, unspecified: Secondary | ICD-10-CM | POA: Diagnosis not present

## 2019-08-24 DIAGNOSIS — I2510 Atherosclerotic heart disease of native coronary artery without angina pectoris: Secondary | ICD-10-CM | POA: Diagnosis not present

## 2019-08-24 DIAGNOSIS — I214 Non-ST elevation (NSTEMI) myocardial infarction: Secondary | ICD-10-CM | POA: Diagnosis not present

## 2019-08-24 DIAGNOSIS — G8929 Other chronic pain: Secondary | ICD-10-CM | POA: Diagnosis not present

## 2019-08-24 DIAGNOSIS — E559 Vitamin D deficiency, unspecified: Secondary | ICD-10-CM | POA: Diagnosis not present

## 2019-08-24 DIAGNOSIS — I4819 Other persistent atrial fibrillation: Secondary | ICD-10-CM | POA: Diagnosis not present

## 2019-08-24 DIAGNOSIS — M4800 Spinal stenosis, site unspecified: Secondary | ICD-10-CM | POA: Diagnosis not present

## 2019-08-24 DIAGNOSIS — I5032 Chronic diastolic (congestive) heart failure: Secondary | ICD-10-CM | POA: Diagnosis not present

## 2019-08-24 DIAGNOSIS — Z7952 Long term (current) use of systemic steroids: Secondary | ICD-10-CM | POA: Diagnosis not present

## 2019-08-24 DIAGNOSIS — E213 Hyperparathyroidism, unspecified: Secondary | ICD-10-CM | POA: Diagnosis not present

## 2019-08-24 DIAGNOSIS — F329 Major depressive disorder, single episode, unspecified: Secondary | ICD-10-CM | POA: Diagnosis not present

## 2019-08-24 DIAGNOSIS — J4540 Moderate persistent asthma, uncomplicated: Secondary | ICD-10-CM | POA: Diagnosis not present

## 2019-08-24 DIAGNOSIS — I110 Hypertensive heart disease with heart failure: Secondary | ICD-10-CM | POA: Diagnosis not present

## 2019-08-24 DIAGNOSIS — Z7951 Long term (current) use of inhaled steroids: Secondary | ICD-10-CM | POA: Diagnosis not present

## 2019-08-24 DIAGNOSIS — K219 Gastro-esophageal reflux disease without esophagitis: Secondary | ICD-10-CM | POA: Diagnosis not present

## 2019-08-24 DIAGNOSIS — Z7901 Long term (current) use of anticoagulants: Secondary | ICD-10-CM | POA: Diagnosis not present

## 2019-08-24 DIAGNOSIS — M069 Rheumatoid arthritis, unspecified: Secondary | ICD-10-CM | POA: Diagnosis not present

## 2019-08-25 ENCOUNTER — Encounter: Payer: Self-pay | Admitting: Family

## 2019-08-25 ENCOUNTER — Ambulatory Visit (INDEPENDENT_AMBULATORY_CARE_PROVIDER_SITE_OTHER): Payer: Medicare Other | Admitting: Family

## 2019-08-25 ENCOUNTER — Other Ambulatory Visit: Payer: Self-pay

## 2019-08-25 VITALS — BP 112/64 | HR 77 | Temp 98.2°F | Resp 16

## 2019-08-25 DIAGNOSIS — T50B95D Adverse effect of other viral vaccines, subsequent encounter: Secondary | ICD-10-CM | POA: Diagnosis not present

## 2019-08-25 DIAGNOSIS — Z881 Allergy status to other antibiotic agents status: Secondary | ICD-10-CM | POA: Diagnosis not present

## 2019-08-25 DIAGNOSIS — T50Z95D Adverse effect of other vaccines and biological substances, subsequent encounter: Secondary | ICD-10-CM

## 2019-08-25 DIAGNOSIS — J3089 Other allergic rhinitis: Secondary | ICD-10-CM

## 2019-08-25 DIAGNOSIS — J4540 Moderate persistent asthma, uncomplicated: Secondary | ICD-10-CM | POA: Diagnosis not present

## 2019-08-25 DIAGNOSIS — J302 Other seasonal allergic rhinitis: Secondary | ICD-10-CM

## 2019-08-25 MED ORDER — FLOVENT HFA 220 MCG/ACT IN AERO
1.0000 | INHALATION_SPRAY | Freq: Two times a day (BID) | RESPIRATORY_TRACT | 5 refills | Status: DC
Start: 2019-08-25 — End: 2020-09-29

## 2019-08-25 NOTE — Telephone Encounter (Signed)
Spoke with patient today regarding transportation assistance. Informed patient that we are not able to assist her with [facility] [employer] at this time, but she can give [employer] a call to see if they will be able to assist her with any visits that she may have out of town. Also suggested that is she has a family member or a caregiver that has transportation they might be able to assist her as well. Patient stated understanding and said she will give [employer] a call. Patient has no additional needs at this time.   Closing referral pending any other needs of patient.

## 2019-08-25 NOTE — Progress Notes (Addendum)
[place], [place] [address] Dept: [phone]  FOLLOW UP NOTE  Patient ID: Angela Wong, female    DOB: August 05, 1940  Age: 79 y.o. MRN: [id number] Date of Office Visit: 08/25/2019  Assessment  Chief Complaint: Allergy Testing  HPI Angela Wong is a  79 year old female who presents today for COVID component testing.She was last seen on May 07, 2019 by Dr. Ernst Bowler for moderate persistent asthma, seasonal and perennial allergic rhinitis, chronic sinusitis, chronic urticaria.  Her son is with her today and helps provide history.  She reports that when she was approximately in third grade she received the tetanus antitoxin and had a severe anaphylactic reaction.  Both a pharmacist and her primary care physician have advised her to not get the Prevnar 13 injection due to gelatin products and there possibly being animal products in the vaccine.  She is able to eat eggs and does eat Jell-O products without any products.  She is able to receive steroid injections without any problems.  She reports that she is allergic to all antibiotics, but is able to tolerate Zithromax.  She has had a colonoscopy in the past without any problems, but she did mention that she was irritable for a few weeks after taking the medication used for prep.  She is unaware of the name of the medication.  She uses MiraLAX as needed for constipation.  She most recently took MiraLAX 4 to 5 days ago without any problems.  Moderate persistent asthma is reported as moderately controlled with Singulair 10 mg once a day, Symbicort 160-2 puffs twice a day and ProAir as needed.  She reports rare cough with phlegm and off-and-on shortness of breath.  She denies any wheezing, tightness in her chest, and nocturnal awakenings.  She denies any trips to the emergency room or urgent care due to her asthma and has not required any systemic steroids.  Seasonal and perennial allergic rhinitis is reported as  moderately controlled with Allegra 180 mg once a day.  She reports small amount of rhinorrhea, nasal congestion and postnasal drip.  Chronic urticaria is reported as moderately controlled with Allegra 180 mg once a day.  With flare she will take Benadryl.  Current medications are as listed in the chart  Drug Allergies:  Allergies  Allergen Reactions  . Cephalosporins Hives and Shortness Of Breath  . Ciprofloxacin Hives and Shortness Of Breath  . Diltiazem Shortness Of Breath    Swollen throat   . Doxycycline Hives and Shortness Of Breath  . Horse-Derived Products Anaphylaxis  . Ketek [Telithromycin] Palpitations    Chest discomfort  . Nitrofuran Derivatives Anaphylaxis and Hives    blisters  . Nitrous Oxide Nausea And Vomiting    Severe due to Sjogrens (Auto-Immune Disease)  . Other Anaphylaxis    ALLERGY TO HORSE SERUM  . Penicillins Hives and Shortness Of Breath       . Pentazocine Other (See Comments)    Other reaction(s): Mental Status Changes (intolerance) Altered Mental Status  Altered Mental Status  Altered Mental Status   . Sulfa Antibiotics Hives and Shortness Of Breath  . Trovan [Alatrofloxacin] Palpitations, Other (See Comments) and Anaphylaxis    Chest pain, dizziness, irregular pulse  . Zinc Gelatin [Zinc] Anaphylaxis  . Amlodipine Swelling  . Calcium Channel Blockers     Respiratory distress  . Carvedilol Other (See Comments)    Dizziness, "joint pain, depression"  . Clindamycin/Lincomycin     CP, lock jaw  .  Codeine Nausea Only  . Cymbalta [Duloxetine Hcl] Swelling  . Diovan [Valsartan] Swelling  . Lisinopril Swelling  . Sertraline Other (See Comments)    confusion  . Tramadol Nausea Only  . Loteprednol Etabonate Rash    Review of Systems: Review of Systems  Constitutional: Negative for fever.  HENT: Positive for congestion.        Reports occasional rhinorrhea, nasal congestion and post nasal drip  Eyes:       Denies itchy watery eyes    Cardiovascular: Positive for chest pain and palpitations.       Sees Cardiology and reports that they are aware  Gastrointestinal: Positive for abdominal pain.       Reports occasional abdominal pain  Genitourinary: Negative for dysuria.  Skin: Negative for itching and rash.  Endo/Heme/Allergies: Positive for environmental allergies.    Physical Exam: BP 112/64   Pulse 77   Temp 98.2 F (36.8 C) (Temporal)   Resp 16   SpO2 99%    Physical Exam HENT:     Head: Normocephalic and atraumatic.     Right Ear: Ear canal and external ear normal.     Left Ear: Ear canal and external ear normal.     Ears:     Comments: Unable to visualize bilateral tympanic membranes due to cerumen. Cardiovascular:     Rate and Rhythm: Rhythm irregular.     Heart sounds: Normal heart sounds.     Comments: History of afib Pulmonary:     Effort: Pulmonary effort is normal.     Breath sounds: Normal breath sounds.     Comments: Lungs clear to auscultation Musculoskeletal:     Cervical back: Neck supple.  Skin:    General: Skin is warm.  Neurological:     Mental Status: She is oriented to person, place, and time.  Psychiatric:        Mood and Affect: Mood normal.        Behavior: Behavior normal.        Thought Content: Thought content normal.        Judgment: Judgment normal.     Diagnostics: FVC 1.88 L, FEV1 1.22 L, FVC 2.67 L, FEV1 1.99 L.  Spirometry indicates mild airway restriction and mild airway obstruction.  Post bronchodilator response shows FVC 1.97 L, FEV1 1.36 L.  Spirometry indicates mild restriction with an 11% change in FEV1.  COVID Vaccine Testing - 08/25/19 1017      Test Information   Consent Yes    Medications Triamcinolone    Triamcinolone Lot # [id number]    Triamcinolone EXP DATE 11/14/20    Methylprednisolone Lot # [id number]    Methylprednisolone EXP DATE 12/15/19    Miralax Lot # [id number]    Miralax EXP DATE 05/14/20      Pre Test Vitals   BP 112/64    Pulse 77     Resp 16      SKIN PRICK TESTING - Arm #1   Location Right Arm    Select Select      HISTAMINE (1mg /mL) Skin Prick Arm #1   Histamine Time Testing Placed 1030    Histamine Wheal 2+      Control (negative - HSA) Skin Prick Arm #1   Control Time Testing Placed 1030    Control Wheal Negative      Triamcinolone (40mg /mL) Skin Prick Arm #1   Triamcinolone Time Testing Placed 1030    Triamcinolone Wheal Negative      Methylprednisolone (  40mg /mL) Skin Prick Arm #1   Methylprednisolone Time Testing Placed 1030    Methylprednisolone Wheal Negative      Miralax (1:100 or 1.7 mg/mL) Skin Prick Arm #1   Miralax Time Testing Placed 1030    Miralax Wheal Negative      Miralax (1:10 or 17mg /mL) Skin Prick Arm #1   Miralax Time Testing Placed 1053    Miralax Wheal Negative      Miralax (1:1 or 170mg /mL) Skin Prick Arm #1   Miralax Time Testing Placed 1114    Miralax Wheal Negative      INTRADERMAL TESTING - Arm #2   Location Left Arm    Select Select      Control (negative - HSA) Intradermal Arm #2   Control Time Testing Placed  1053    Control Wheal Negative      Triamcinolone (1:100) Intradermal Arm #2   Triamcinolone Time Testing Placed 1053    Triamcinolone Wheal Negative      Methylprednisolone (1:100) Intradermal Arm #2   Methylprednisolone Time Testing Placed  1053    Methylprednisolone Wheal Negative      Triamcinolone (1:10) Intradermal Arm #2   Triamcinolone Time Testing Placed 1114    Triamcinolone Wheal Negative      Methylprednisolone (1:10) Intradermal Arm #2   Methylprednisolone Time Testing Placed  0114    Methylprednisolone Wheal Negative      Triamcinolone (1:1) Intradermal Arm #2   Triamcinolone Time Testing Placed 1132    Triamcinolone Wheal Negative      Skin Prick/Intradermal Post Testing   Skin Prick/Intradermal Testing Total Pricks 13      Post Test Vitals   BP 114/62    Pulse 80    Resp 17           Assessment and Plan: 1. Adverse  reaction to vaccine, subsequent encounter   2. Not well controlled moderate persistent asthma   3. Seasonal and perennial allergic rhinitis   4. Allergy to multiple antibiotics     Meds ordered this encounter  Medications  . fluticasone (FLOVENT HFA) 220 MCG/ACT inhaler    Sig: Inhale 1 puff into the lungs 2 (two) times daily.    Dispense:  12 g    Refill:  5    Patient Instructions  COVID vaccine component testing - She was able to tolerate the COVID vaccine component testing challenge today at the office without adverse signs or symptoms of an allergic reaction.  - Today's skin prick testing was negative to Miralax (source of PEG 3350), methylprednisolone acetate (source of PEG 3350), and triamcinolone acetonide (source of polysorbate-80).  Discussed with patient that ideally after a negative skin testing a test dose of the vaccine is given in the office but as we do not have access to the vaccine we were unable to do that part.   - Therefore, she has the same risk of systemic reaction associated with receiving the COVID vaccine as the general population.  - Monitor for allergic symptoms such as rash, wheezing, diarrhea, swelling, and vomiting for the next 24 hours. If severe symptoms occur, call 911. For less severe symptoms treat with Benadryl 25-50 mg every 6 hours and call the clinic.  Should you choose to receive a COVID vaccine: Have someone drive you to the testing site, get the vaccine at a site where they are prepared to treat an allergic reaction, and stay for the full 30 minutes after receiving the vaccine    Call the  clinic if this treatment plan is not working well for you    Moderate persistent asthma Start Flovent 220 mcg  1 puff twice a day to help prevent cough and wheeze Continue Symbicort 160/4.5- 2 puffs twice a day to help prevent cough and wheeze. Continue Singulair 10 mg once a day to help prevent cough and wheeze Continue ProAir 2 puffs every 4 hours as needed  for cough, wheeze, tightness in chest, or shortness of breath. Asthma control goals:   Full participation in all desired activities (may need albuterol before activity)  Albuterol use two time or less a week on average (not counting use with activity)  Cough interfering with sleep two time or less a month  Oral steroids no more than once a year  No hospitalizations  Seasonal and Perennial allergic rhinitis Continue Allegra 180 mg once a day as needed for runny nose or itching May use saline rinse or saline gel for nasal symptoms  Please let us know if this treatment plan is not working well for you. Schedule follow up appointment in 2 months   Return in about 2 months (around 10/25/2019), or if symptoms worsen or fail to improve.    Thank you for the opportunity to care for this patient.  Please do not hesitate to contact me with questions.  Althea Charon, FNP [facility]

## 2019-08-25 NOTE — Patient Instructions (Addendum)
COVID vaccine component testing - She was able to tolerate the COVID vaccine component testing challenge today at the office without adverse signs or symptoms of an allergic reaction.  - Today's skin prick testing was negative to Miralax (source of PEG 3350), methylprednisolone acetate (source of PEG 3350), and triamcinolone acetonide (source of polysorbate-80).  Discussed with patient that ideally after a negative skin testing a test dose of the vaccine is given in the office but as we do not have access to the vaccine we were unable to do that part.   - Therefore, she has the same risk of systemic reaction associated with receiving the COVID vaccine as the general population.  - Monitor for allergic symptoms such as rash, wheezing, diarrhea, swelling, and vomiting for the next 24 hours. If severe symptoms occur, call 911. For less severe symptoms treat with Benadryl 25-50 mg every 6 hours and call the clinic.  Should you choose to receive a COVID vaccine: Have someone drive you to the testing site, get the vaccine at a site where they are prepared to treat an allergic reaction, and stay for the full 30 minutes after receiving the vaccine    Call the clinic if this treatment plan is not working well for you    Moderate persistent asthma Start Flovent 220 mcg  1 puff twice a day to help prevent cough and wheeze Continue Symbicort 160/4.5- 2 puffs twice a day to help prevent cough and wheeze. Continue Singulair 10 mg once a day to help prevent cough and wheeze Continue ProAir 2 puffs every 4 hours as needed for cough, wheeze, tightness in chest, or shortness of breath. Asthma control goals:   Full participation in all desired activities (may need albuterol before activity)  Albuterol use two time or less a week on average (not counting use with activity)  Cough interfering with sleep two time or less a month  Oral steroids no more than once a year  No hospitalizations  Seasonal and  Perennial allergic rhinitis Continue Allegra 180 mg once a day as needed for runny nose or itching May use saline rinse or saline gel for nasal symptoms  Please let us know if this treatment plan is not working well for you. Schedule follow up appointment in 2 months

## 2019-08-27 DIAGNOSIS — I2510 Atherosclerotic heart disease of native coronary artery without angina pectoris: Secondary | ICD-10-CM | POA: Diagnosis not present

## 2019-08-27 DIAGNOSIS — E039 Hypothyroidism, unspecified: Secondary | ICD-10-CM | POA: Diagnosis not present

## 2019-08-27 DIAGNOSIS — J449 Chronic obstructive pulmonary disease, unspecified: Secondary | ICD-10-CM | POA: Diagnosis not present

## 2019-08-27 DIAGNOSIS — I4819 Other persistent atrial fibrillation: Secondary | ICD-10-CM | POA: Diagnosis not present

## 2019-08-27 DIAGNOSIS — I214 Non-ST elevation (NSTEMI) myocardial infarction: Secondary | ICD-10-CM | POA: Diagnosis not present

## 2019-08-27 DIAGNOSIS — Z8543 Personal history of malignant neoplasm of ovary: Secondary | ICD-10-CM | POA: Diagnosis not present

## 2019-08-27 DIAGNOSIS — Z7952 Long term (current) use of systemic steroids: Secondary | ICD-10-CM | POA: Diagnosis not present

## 2019-08-27 DIAGNOSIS — M4800 Spinal stenosis, site unspecified: Secondary | ICD-10-CM | POA: Diagnosis not present

## 2019-08-27 DIAGNOSIS — M3500 Sicca syndrome, unspecified: Secondary | ICD-10-CM | POA: Diagnosis not present

## 2019-08-27 DIAGNOSIS — I110 Hypertensive heart disease with heart failure: Secondary | ICD-10-CM | POA: Diagnosis not present

## 2019-08-27 DIAGNOSIS — I5032 Chronic diastolic (congestive) heart failure: Secondary | ICD-10-CM | POA: Diagnosis not present

## 2019-08-27 DIAGNOSIS — F329 Major depressive disorder, single episode, unspecified: Secondary | ICD-10-CM | POA: Diagnosis not present

## 2019-08-27 DIAGNOSIS — E213 Hyperparathyroidism, unspecified: Secondary | ICD-10-CM | POA: Diagnosis not present

## 2019-08-27 DIAGNOSIS — K219 Gastro-esophageal reflux disease without esophagitis: Secondary | ICD-10-CM | POA: Diagnosis not present

## 2019-08-27 DIAGNOSIS — G8929 Other chronic pain: Secondary | ICD-10-CM | POA: Diagnosis not present

## 2019-08-27 DIAGNOSIS — M069 Rheumatoid arthritis, unspecified: Secondary | ICD-10-CM | POA: Diagnosis not present

## 2019-08-27 DIAGNOSIS — I420 Dilated cardiomyopathy: Secondary | ICD-10-CM | POA: Diagnosis not present

## 2019-08-27 DIAGNOSIS — Z7951 Long term (current) use of inhaled steroids: Secondary | ICD-10-CM | POA: Diagnosis not present

## 2019-08-27 DIAGNOSIS — E559 Vitamin D deficiency, unspecified: Secondary | ICD-10-CM | POA: Diagnosis not present

## 2019-08-27 DIAGNOSIS — I7300 Raynaud's syndrome without gangrene: Secondary | ICD-10-CM | POA: Diagnosis not present

## 2019-08-27 DIAGNOSIS — Z7901 Long term (current) use of anticoagulants: Secondary | ICD-10-CM | POA: Diagnosis not present

## 2019-08-27 DIAGNOSIS — Z79891 Long term (current) use of opiate analgesic: Secondary | ICD-10-CM | POA: Diagnosis not present

## 2019-08-27 DIAGNOSIS — Z9181 History of falling: Secondary | ICD-10-CM | POA: Diagnosis not present

## 2019-08-27 DIAGNOSIS — J4540 Moderate persistent asthma, uncomplicated: Secondary | ICD-10-CM | POA: Diagnosis not present

## 2019-08-27 DIAGNOSIS — M797 Fibromyalgia: Secondary | ICD-10-CM | POA: Diagnosis not present

## 2019-08-31 DIAGNOSIS — I110 Hypertensive heart disease with heart failure: Secondary | ICD-10-CM | POA: Diagnosis not present

## 2019-08-31 DIAGNOSIS — I4821 Permanent atrial fibrillation: Secondary | ICD-10-CM | POA: Diagnosis not present

## 2019-08-31 DIAGNOSIS — I214 Non-ST elevation (NSTEMI) myocardial infarction: Secondary | ICD-10-CM | POA: Diagnosis not present

## 2019-08-31 DIAGNOSIS — I5032 Chronic diastolic (congestive) heart failure: Secondary | ICD-10-CM | POA: Diagnosis not present

## 2019-08-31 DIAGNOSIS — I420 Dilated cardiomyopathy: Secondary | ICD-10-CM | POA: Diagnosis not present

## 2019-08-31 DIAGNOSIS — I2510 Atherosclerotic heart disease of native coronary artery without angina pectoris: Secondary | ICD-10-CM | POA: Diagnosis not present

## 2019-08-31 DIAGNOSIS — I4819 Other persistent atrial fibrillation: Secondary | ICD-10-CM | POA: Diagnosis not present

## 2019-08-31 DIAGNOSIS — Z7901 Long term (current) use of anticoagulants: Secondary | ICD-10-CM | POA: Diagnosis not present

## 2019-09-01 DIAGNOSIS — M1180 Other specified crystal arthropathies, unspecified site: Secondary | ICD-10-CM | POA: Diagnosis not present

## 2019-09-01 DIAGNOSIS — M1990 Unspecified osteoarthritis, unspecified site: Secondary | ICD-10-CM | POA: Diagnosis not present

## 2019-09-01 DIAGNOSIS — R5382 Chronic fatigue, unspecified: Secondary | ICD-10-CM | POA: Diagnosis not present

## 2019-09-01 DIAGNOSIS — M797 Fibromyalgia: Secondary | ICD-10-CM | POA: Diagnosis not present

## 2019-09-01 DIAGNOSIS — M79643 Pain in unspecified hand: Secondary | ICD-10-CM | POA: Diagnosis not present

## 2019-09-01 DIAGNOSIS — I7300 Raynaud's syndrome without gangrene: Secondary | ICD-10-CM | POA: Diagnosis not present

## 2019-09-01 DIAGNOSIS — M3500 Sicca syndrome, unspecified: Secondary | ICD-10-CM | POA: Diagnosis not present

## 2019-09-01 DIAGNOSIS — G8929 Other chronic pain: Secondary | ICD-10-CM | POA: Diagnosis not present

## 2019-09-01 DIAGNOSIS — M25561 Pain in right knee: Secondary | ICD-10-CM | POA: Diagnosis not present

## 2019-09-01 DIAGNOSIS — M2550 Pain in unspecified joint: Secondary | ICD-10-CM | POA: Diagnosis not present

## 2019-09-01 DIAGNOSIS — M25562 Pain in left knee: Secondary | ICD-10-CM | POA: Diagnosis not present

## 2019-09-02 ENCOUNTER — Telehealth: Payer: Self-pay | Admitting: *Deleted

## 2019-09-02 DIAGNOSIS — I420 Dilated cardiomyopathy: Secondary | ICD-10-CM | POA: Diagnosis not present

## 2019-09-02 DIAGNOSIS — I5032 Chronic diastolic (congestive) heart failure: Secondary | ICD-10-CM | POA: Diagnosis not present

## 2019-09-02 DIAGNOSIS — I214 Non-ST elevation (NSTEMI) myocardial infarction: Secondary | ICD-10-CM | POA: Diagnosis not present

## 2019-09-02 DIAGNOSIS — I2510 Atherosclerotic heart disease of native coronary artery without angina pectoris: Secondary | ICD-10-CM | POA: Diagnosis not present

## 2019-09-02 DIAGNOSIS — I110 Hypertensive heart disease with heart failure: Secondary | ICD-10-CM | POA: Diagnosis not present

## 2019-09-02 DIAGNOSIS — I4819 Other persistent atrial fibrillation: Secondary | ICD-10-CM | POA: Diagnosis not present

## 2019-09-02 NOTE — Telephone Encounter (Signed)
VM from Amy PT with [facility] left message stating pt can't tolerate much activity due to arthritis and CHF and pt has requested to be discharged from PT for now till she has her arthritis under better control and orders will be sent over to be signed off on.

## 2019-09-03 ENCOUNTER — Telehealth: Payer: Self-pay | Admitting: Family

## 2019-09-03 NOTE — Telephone Encounter (Signed)
Patient called and said that she came in and had a challenge done and was put on Flovent and she said it made her feel bad. And she stop it about a week ago, and said that lungs are clear and she is sore and muscle hurt and she feels yucky .[phone]

## 2019-09-03 NOTE — Telephone Encounter (Signed)
Please instruct the patient she should get tested for COVID with her symptoms and now reporting shortess of breath. If her symptoms worsen she needs to proceed to the emergency room. I will have them add her name to the list for getting a COVID vaccine in one of our offices.

## 2019-09-03 NOTE — Telephone Encounter (Signed)
Patient states that she came in last Wednesday for a component test and her breathing wasn't quiet where Dr.Gallagher had hoped so he sent her home with a prescription for Flovent. She has been doing one puff twice a day since last Thursday. Ever since starting the Flovent, she has been feeling very bad. She is having Stomach issues, congestion, muscle/joint aches and pains, headaches, mouth and tooth pain, and feels like her heart is racing. Patient states that she remembers years ago that Flovent caused polyps and a bloody nose. It is also $90 and patient states that she can not afford that. She has stopped using the inhaler and is not interested in using any other inhalers at this time.   She also wanted to let us know that several days after the component test, she started itching really bad where her ID's were.No other symptoms. She just wants to make sure this is not some type of delayed reaction and that it is still okay to get the vaccine.

## 2019-09-03 NOTE — Telephone Encounter (Signed)
Can you call this patient please and get more details of how she is feeling? Thank you

## 2019-09-03 NOTE — Telephone Encounter (Signed)
Please let the patient know that these symptoms should not be caused by her Flovent, but that is ok that he has stopped this medication. Please let our office know if you are having to use your albuterol inhaler more than 2 times a week or having any breathing problems. I would recommend that she get tested for COVID due the symptoms that she is having. I would also have her speak with her cardiologist/primary care physician due to her heart racing and having a history of permanent a-fib. If her symptoms worsen or persist I would proceed to the Emergency room. As for the question about the COVID component testing- if a reaction were to occur it would be during the time of the testing and not several days later.

## 2019-09-03 NOTE — Telephone Encounter (Signed)
Called and spoke to patient and she has been advised of Chrissie's recommendation. She states she's not having any issues with her heart at the moment since she's stopped using the Flovent. So I informed patient if it happened again that she should contact her cardiologist/pcp. Patient expressed understanding. I also informed her about the covid testing and she wanted to know how she was going to get tested if she couldn't even make it to the bathroom because she's so short of breath. I informed patient if she could find someone that could help her to her car. She also asked if she could get the Covid vaccine and I read off what the discharge had said at her component visit and told her if she considered getting one that she should make sure that she's at a place that is able to deal with a possible allergic reaction. I also mentioned that we were going to start giving vaccines here but not sure of the date and will send a message to the provider to let them know that patient is wanting to get the vaccine here. Patient did mention wanting someone to give it to her in her home. I informed her that I was not aware of any places that give them in home and she stated that there was a church that sends nurses out to give vaccines in home. I stated she called the church to see if they could come to her but to also let them know that she could possibly have a reaction and they should be prepared for it. Patient states that she would rather get the vaccine in our office and would like to be put on the list for it.

## 2019-09-03 NOTE — Telephone Encounter (Signed)
Ok

## 2019-09-06 DIAGNOSIS — I420 Dilated cardiomyopathy: Secondary | ICD-10-CM | POA: Diagnosis not present

## 2019-09-06 DIAGNOSIS — M0579 Rheumatoid arthritis with rheumatoid factor of multiple sites without organ or systems involvement: Secondary | ICD-10-CM | POA: Diagnosis not present

## 2019-09-06 DIAGNOSIS — Z515 Encounter for palliative care: Secondary | ICD-10-CM | POA: Diagnosis not present

## 2019-09-06 NOTE — Telephone Encounter (Signed)
Called and spoke with patient and she was very upset about being advised to go and get COVID tested. She states that she does not feel that her symptoms are COVID related she thinks that it is side affects from the [place]. She states that her arthritis is so bad that it is physically very difficult for her to leave the house and get in the car and that she does not want to make things harder for her family which are going through a hard time right now. She stated that she is starting to feel some better. Please advise.

## 2019-09-06 NOTE — Telephone Encounter (Signed)
Please let her know that I am glad that she is getting better. The Flovent should not be causing these symptoms still from where she has been off of that medication for a week. Please let her know that if her symptoms worsen or persist to proceed to the emergency room. Thank you.

## 2019-09-06 NOTE — Telephone Encounter (Signed)
Called patient back and there was no answer, after voicemail it stated that the call could not be completed as dialed. Will try again shortly.

## 2019-09-07 ENCOUNTER — Telehealth: Payer: Self-pay | Admitting: *Deleted

## 2019-09-07 NOTE — Telephone Encounter (Signed)
Attempted to call patient and did not receive an answer. Was unable to leave a voicemail.

## 2019-09-07 NOTE — Telephone Encounter (Signed)
[name] with [facility] called to let us know that patient received a COVID test yesterday and her results were negative.

## 2019-09-07 NOTE — Telephone Encounter (Signed)
Patients COVID test was negative per [facility] Nurse.

## 2019-09-08 ENCOUNTER — Telehealth: Payer: Self-pay | Admitting: *Deleted

## 2019-09-08 DIAGNOSIS — I110 Hypertensive heart disease with heart failure: Secondary | ICD-10-CM | POA: Diagnosis not present

## 2019-09-08 DIAGNOSIS — I420 Dilated cardiomyopathy: Secondary | ICD-10-CM | POA: Diagnosis not present

## 2019-09-08 DIAGNOSIS — I5032 Chronic diastolic (congestive) heart failure: Secondary | ICD-10-CM | POA: Diagnosis not present

## 2019-09-08 DIAGNOSIS — I4819 Other persistent atrial fibrillation: Secondary | ICD-10-CM | POA: Diagnosis not present

## 2019-09-08 DIAGNOSIS — I214 Non-ST elevation (NSTEMI) myocardial infarction: Secondary | ICD-10-CM | POA: Diagnosis not present

## 2019-09-08 DIAGNOSIS — I2510 Atherosclerotic heart disease of native coronary artery without angina pectoris: Secondary | ICD-10-CM | POA: Diagnosis not present

## 2019-09-08 NOTE — Telephone Encounter (Signed)
Please call the patient and see if she can schedule a follow up appointment to discuss her shortness of breath and have spirometry completed.  Thank you!

## 2019-09-08 NOTE — Telephone Encounter (Signed)
Vm from [place] [facility]/ [facility] Angela Wong was put on Flovent inh from allergist Not until after she started using it did she remember that she could not take it it gave her side effects that mimiced a sinus infection. She has nasal drainage, congestion, sore muscles, throat is not read or have white bumps on it, lungs are clear, weight is down. She does feel better than she did 2 days ago when she started the Flovent She had a covid test which was negative Question about putting patient on an abx

## 2019-09-08 NOTE — Telephone Encounter (Signed)
Called patient however there was no answer and was unable to leave a voicemail.

## 2019-09-09 NOTE — Telephone Encounter (Signed)
Will hold off on antibiotics, if symptoms worsen call back

## 2019-09-09 NOTE — Telephone Encounter (Signed)
Nurse aware 

## 2019-09-10 NOTE — Telephone Encounter (Signed)
Patient is not having much shortness of breath and is wanting to get her COVID vaccine ONLY with Dr. Ernst Bowler. She has severe allergies and is not comfortable with others and doesn't want to be seen by another provider. She is experiencing nasal congestion and chest congestion that she is currently taking her medications for. Spoke with Dr. Ernst Bowler and would like to place patient on a list to be monitor and get her COVID vaccine with [place].

## 2019-09-11 DIAGNOSIS — Z515 Encounter for palliative care: Secondary | ICD-10-CM | POA: Diagnosis not present

## 2019-09-11 DIAGNOSIS — I420 Dilated cardiomyopathy: Secondary | ICD-10-CM | POA: Diagnosis not present

## 2019-09-13 ENCOUNTER — Other Ambulatory Visit: Payer: Self-pay | Admitting: Family

## 2019-09-13 DIAGNOSIS — J4540 Moderate persistent asthma, uncomplicated: Secondary | ICD-10-CM

## 2019-09-13 NOTE — Telephone Encounter (Signed)
I called the patient and offered her 10/04/19, in [place], to get her 1st Covid vaccine. She could not do it, she had another appointment. I told her we would call her again if we opened up another [facility] or [place]. She said please do.

## 2019-09-13 NOTE — Telephone Encounter (Signed)
I scheduled her for September 20, at 10:30, in [place].

## 2019-09-13 NOTE — Telephone Encounter (Signed)
Please read thread and see if this patient would like to be seen on 9-20? Angela Wong

## 2019-09-13 NOTE — Telephone Encounter (Signed)
Did we ever decide whether we were doing a COVID vaccination clinic at the [place] office?   Salvatore Marvel, MD [facility] [place] [facility]

## 2019-09-14 DIAGNOSIS — I420 Dilated cardiomyopathy: Secondary | ICD-10-CM | POA: Diagnosis not present

## 2019-09-14 DIAGNOSIS — M0579 Rheumatoid arthritis with rheumatoid factor of multiple sites without organ or systems involvement: Secondary | ICD-10-CM | POA: Diagnosis not present

## 2019-09-14 DIAGNOSIS — Z515 Encounter for palliative care: Secondary | ICD-10-CM | POA: Diagnosis not present

## 2019-09-15 DIAGNOSIS — I5032 Chronic diastolic (congestive) heart failure: Secondary | ICD-10-CM | POA: Diagnosis not present

## 2019-09-15 DIAGNOSIS — I110 Hypertensive heart disease with heart failure: Secondary | ICD-10-CM | POA: Diagnosis not present

## 2019-09-15 DIAGNOSIS — I2510 Atherosclerotic heart disease of native coronary artery without angina pectoris: Secondary | ICD-10-CM | POA: Diagnosis not present

## 2019-09-15 DIAGNOSIS — I214 Non-ST elevation (NSTEMI) myocardial infarction: Secondary | ICD-10-CM | POA: Diagnosis not present

## 2019-09-15 DIAGNOSIS — I4819 Other persistent atrial fibrillation: Secondary | ICD-10-CM | POA: Diagnosis not present

## 2019-09-15 DIAGNOSIS — I420 Dilated cardiomyopathy: Secondary | ICD-10-CM | POA: Diagnosis not present

## 2019-09-17 ENCOUNTER — Other Ambulatory Visit: Payer: Self-pay | Admitting: Family

## 2019-09-17 DIAGNOSIS — J4540 Moderate persistent asthma, uncomplicated: Secondary | ICD-10-CM

## 2019-09-21 ENCOUNTER — Other Ambulatory Visit: Payer: Self-pay | Admitting: Family

## 2019-09-21 ENCOUNTER — Telehealth: Payer: Self-pay | Admitting: *Deleted

## 2019-09-21 DIAGNOSIS — I420 Dilated cardiomyopathy: Secondary | ICD-10-CM | POA: Diagnosis not present

## 2019-09-21 DIAGNOSIS — M0579 Rheumatoid arthritis with rheumatoid factor of multiple sites without organ or systems involvement: Secondary | ICD-10-CM | POA: Diagnosis not present

## 2019-09-21 DIAGNOSIS — I4821 Permanent atrial fibrillation: Secondary | ICD-10-CM

## 2019-09-21 DIAGNOSIS — Z515 Encounter for palliative care: Secondary | ICD-10-CM | POA: Diagnosis not present

## 2019-09-21 DIAGNOSIS — K644 Residual hemorrhoidal skin tags: Secondary | ICD-10-CM | POA: Diagnosis not present

## 2019-09-21 NOTE — Telephone Encounter (Signed)
Fax received [facility]INR PT/INR self testing service Test date/time 09/21/19 912 am INR 1.8

## 2019-09-22 ENCOUNTER — Telehealth: Payer: Self-pay | Admitting: Family

## 2019-09-22 NOTE — Telephone Encounter (Signed)
Pt is mad because no has called her with her INR results from yesterday. She knows that it is 1.8. She said she had a heart attack once when it was 1.6. She asked if [name] was her here today and I told her no. Sent message to triage and cathy.

## 2019-09-22 NOTE — Telephone Encounter (Signed)
2mg  daily except 4mg  on M,W and F- recheck in 1 week

## 2019-09-22 NOTE — Telephone Encounter (Signed)
MMM - I called patient - she states that directions you gave her today is how she is already taking it.  Any changes need to be made?   Please advise asap

## 2019-09-27 DIAGNOSIS — I420 Dilated cardiomyopathy: Secondary | ICD-10-CM | POA: Diagnosis not present

## 2019-09-27 DIAGNOSIS — Z515 Encounter for palliative care: Secondary | ICD-10-CM | POA: Diagnosis not present

## 2019-09-27 DIAGNOSIS — M0579 Rheumatoid arthritis with rheumatoid factor of multiple sites without organ or systems involvement: Secondary | ICD-10-CM | POA: Diagnosis not present

## 2019-09-28 DIAGNOSIS — I4821 Permanent atrial fibrillation: Secondary | ICD-10-CM | POA: Diagnosis not present

## 2019-09-28 DIAGNOSIS — D235 Other benign neoplasm of skin of trunk: Secondary | ICD-10-CM | POA: Diagnosis not present

## 2019-09-28 DIAGNOSIS — L821 Other seborrheic keratosis: Secondary | ICD-10-CM | POA: Diagnosis not present

## 2019-09-28 DIAGNOSIS — Z7901 Long term (current) use of anticoagulants: Secondary | ICD-10-CM | POA: Diagnosis not present

## 2019-09-28 DIAGNOSIS — L853 Xerosis cutis: Secondary | ICD-10-CM | POA: Diagnosis not present

## 2019-09-28 DIAGNOSIS — L218 Other seborrheic dermatitis: Secondary | ICD-10-CM | POA: Diagnosis not present

## 2019-09-28 DIAGNOSIS — L718 Other rosacea: Secondary | ICD-10-CM | POA: Diagnosis not present

## 2019-09-28 DIAGNOSIS — L578 Other skin changes due to chronic exposure to nonionizing radiation: Secondary | ICD-10-CM | POA: Diagnosis not present

## 2019-09-28 DIAGNOSIS — Z85828 Personal history of other malignant neoplasm of skin: Secondary | ICD-10-CM | POA: Diagnosis not present

## 2019-09-28 DIAGNOSIS — D1801 Hemangioma of skin and subcutaneous tissue: Secondary | ICD-10-CM | POA: Diagnosis not present

## 2019-10-03 ENCOUNTER — Other Ambulatory Visit: Payer: Self-pay | Admitting: Family

## 2019-10-03 DIAGNOSIS — J4540 Moderate persistent asthma, uncomplicated: Secondary | ICD-10-CM

## 2019-10-04 ENCOUNTER — Other Ambulatory Visit: Payer: Self-pay

## 2019-10-04 ENCOUNTER — Ambulatory Visit (INDEPENDENT_AMBULATORY_CARE_PROVIDER_SITE_OTHER): Payer: Medicare Other

## 2019-10-04 DIAGNOSIS — Z23 Encounter for immunization: Secondary | ICD-10-CM | POA: Diagnosis not present

## 2019-10-04 NOTE — Progress Notes (Signed)
   Covid-19 Vaccination Clinic  Name:  Angela Wong    MRN: [id number] DOB: [birth year]  10/04/2019  Angela Wong was observed post Covid-19 immunization for 15 minutes without incident. She was provided with Vaccine Information Sheet and instruction to access the V-Safe system.   Angela Wong was instructed to call 911 with any severe reactions post vaccine: Marland Kitchen Difficulty breathing  . Swelling of face and throat  . A fast heartbeat  . A bad rash all over body  . Dizziness and weakness   Immunizations Administered    Name Date Dose VIS Date Route   Pfizer COVID-19 Vaccine 10/04/2019 10:59 AM 0.3 mL 03/10/2018 Intramuscular   Manufacturer: [employer], [employer]   Lot: [id number]   [place]: [id number]

## 2019-10-05 ENCOUNTER — Telehealth: Payer: Self-pay | Admitting: Family

## 2019-10-05 NOTE — Telephone Encounter (Signed)
She can hold her warfarin dose for today, then resume current dose.

## 2019-10-05 NOTE — Telephone Encounter (Signed)
Pt called stating that before she got her 1st COVID Vaccine yesterday, her INR reading was 2.0 and then today after getting the vaccine her INR reading is 3.8.Marland Kitchen wants advice from PCP.

## 2019-10-06 ENCOUNTER — Other Ambulatory Visit: Payer: Self-pay | Admitting: Allergy & Immunology

## 2019-10-06 ENCOUNTER — Telehealth: Payer: Self-pay

## 2019-10-06 ENCOUNTER — Other Ambulatory Visit: Payer: Self-pay | Admitting: *Deleted

## 2019-10-06 DIAGNOSIS — J4540 Moderate persistent asthma, uncomplicated: Secondary | ICD-10-CM

## 2019-10-06 MED ORDER — MONTELUKAST SODIUM 10 MG PO TABS: 10.0000 mg | ORAL_TABLET | Freq: Every day | ORAL | 5 refills | Status: DC

## 2019-10-06 NOTE — Telephone Encounter (Signed)
Called patient and advised. Patient verbalized understanding. Patient wanted a refill of her Montelukast sent in to the [facility] in [place]. Prescription has been sent in for the patient. Patient verbalized understanding.

## 2019-10-06 NOTE — Telephone Encounter (Signed)
Patient called stating she had the Covid vaccine in our clinic Monday and she states her arms are sore and stiffness in her legs. She states she does have bad circulation and Raynauds syndrome. Her main concern was her INR levels are supposed to between 2-3 and her levels yesterday was a 3.8. She called her PCP and was instructed to skip a dose and resume the next day. Patient wants to know your thoughts on this.

## 2019-10-06 NOTE — Telephone Encounter (Signed)
That sounds like a good plan.  I have never managed INR levels in my life and it sounds like her PCP is doing a great job of doing so.  As for the arm soreness in the muscle stiffness, that is a normal side effect of the vaccine.  Salvatore Marvel, MD [facility]

## 2019-10-08 DIAGNOSIS — I495 Sick sinus syndrome: Secondary | ICD-10-CM | POA: Diagnosis not present

## 2019-10-08 DIAGNOSIS — I4821 Permanent atrial fibrillation: Secondary | ICD-10-CM | POA: Diagnosis not present

## 2019-10-11 NOTE — Telephone Encounter (Signed)
Patient seen since telephone encounter.  This encounter will now be closed. 

## 2019-10-12 DIAGNOSIS — M0579 Rheumatoid arthritis with rheumatoid factor of multiple sites without organ or systems involvement: Secondary | ICD-10-CM | POA: Diagnosis not present

## 2019-10-12 DIAGNOSIS — Z515 Encounter for palliative care: Secondary | ICD-10-CM | POA: Diagnosis not present

## 2019-10-12 DIAGNOSIS — I420 Dilated cardiomyopathy: Secondary | ICD-10-CM | POA: Diagnosis not present

## 2019-10-12 NOTE — Chronic Care Management (AMB) (Addendum)
Chronic Care Management   Follow Up Note   05/28/2019 Name: Angela Wong MRN: [id number] DOB: 06/23/1940  Referred by: [name], FNP Reason for referral : Chronic Care Management (RN follow up)   Angela Wong is a 79 y.o. year old female who is a primary care patient of [name], FNP. The CCM team was consulted for assistance with chronic disease management and care coordination needs.    Review of patient status, including review of consultants reports, relevant laboratory and other test results, and collaboration with appropriate care team members and the patient's provider was performed as part of comprehensive patient evaluation and provision of chronic care management services.    SDOH (Social Determinants of Health) assessments performed: Yes: transportation See Care Plan activities for detailed interventions related to [facility])      Outpatient Encounter Medications as of 05/28/2019  Medication Sig   albuterol (VENTOLIN HFA) 108 (90 Base) MCG/ACT inhaler INHALE 2 PUFFS BY MOUTH EVERY 6 HOURS AS NEEDED FOR WHEEZING (Patient taking differently: Inhale 2 puffs into the lungs every 6 (six) hours as needed for wheezing or shortness of breath. INHALE 2 PUFFS BY MOUTH EVERY 6 HOURS AS NEEDED FOR WHEEZING)   B Complex-C (B-COMPLEX WITH VITAMIN C) tablet Take 1 tablet by mouth every evening.    Cholecalciferol (VITAMIN D) 2000 UNITS CAPS Take 2 capsules by mouth every evening.    diphenhydrAMINE (BENADRYL) 25 MG tablet Take 25 mg by mouth daily as needed for itching or allergies.    docusate sodium (COLACE) 100 MG capsule Take 400 mg by mouth every morning.    esomeprazole (NEXIUM) 40 MG capsule Take 1 capsule (40 mg total) by mouth 2 (two) times daily.   fexofenadine (ALLEGRA) 180 MG tablet Take 180 mg by mouth every morning.    Magnesium 400 MG TABS Take 400 mg by mouth every evening.    Olopatadine HCl 0.2 % SOLN Apply 1 drop to eye daily as needed (for allergy eye).     predniSONE (DELTASONE) 5 MG tablet Take 5 mg by mouth every morning.   RESTASIS 0.05 % ophthalmic emulsion INSTILL 1 DROP INTO EACH EYE TWICE DAILY (Patient taking differently: Place 1 drop into both eyes 2 (two) times daily. )   Simethicone (GAS-X EXTRA STRENGTH) 125 MG CAPS Take 2 capsules by mouth daily as needed (for gas).    SYMBICORT 160-4.5 MCG/ACT inhaler Inhale 2 puffs by mouth twice daily (Patient taking differently: Inhale 2 puffs into the lungs in the morning and at bedtime. )   traZODone (DESYREL) 150 MG tablet Take 1 tablet (150 mg total) by mouth at bedtime.   vitamin B-12 (CYANOCOBALAMIN) 500 MCG tablet Take 1 tablet (500 mcg total) by mouth daily.   [DISCONTINUED] atenolol (TENORMIN) 25 MG tablet Take 12.5 mg by mouth daily. *May take additional 12.5mg  daily as needed for A-Fib   [DISCONTINUED] Baclofen 5 MG TABS Take 5 mg by mouth 3 (three) times daily as needed.   [DISCONTINUED] bumetanide (BUMEX) 0.5 MG tablet Take 1 tablet (0.5 mg total) by mouth every other day.   [DISCONTINUED] bumetanide (BUMEX) 1 MG tablet TAKE 1 TABLET BY MOUTH EVERY OTHER DAY ON OPPOSITE DAY OF 0.5MG  DOSE   [DISCONTINUED] Iron Polysacch Cmplx-B12-FA (POLY-IRON 150 FORTE) 150-0.025-1 MG CAPS Take 1 capsule by mouth daily.   [DISCONTINUED] levothyroxine (EUTHYROX) 50 MCG tablet Take 2 tablets (100 mcg total) by mouth every morning.   [DISCONTINUED] losartan (COZAAR) 100 MG tablet Take 1  tablet (100 mg total) by mouth daily.   [DISCONTINUED] montelukast (SINGULAIR) 10 MG tablet Take 1 tablet (10 mg total) by mouth at bedtime. (Patient not taking: Reported on 06/15/2019)   [DISCONTINUED] PREVIDENT 5000 DRY MOUTH 1.1 % GEL dental gel Place 1 application onto teeth as needed (as needed for dry mouth).    [DISCONTINUED] warfarin (COUMADIN) 1 MG tablet TAKE 2 TO 4 TABLETS BY MOUTH ONCE DAILY AS DIRECTED BY  ANTICOAGULATION  CLINIC   No facility-administered encounter medications on file as of 05/28/2019.     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  Nurse Case Manager Clinical Goal(s):  Over the next 10 days, patient will talk with [place] regarding transportation assistance  Interventions:  Inter-disciplinary care team collaboration (see longitudinal plan of care) Chart reviewed including recent office notes and lab results Previously consulted by Victorino December, NP with [facility] Talked with patient by telephone Discussed transportation limitations Discussed family involvement They assist with transportation when able but they aren't always available Discussed physical mobility limitations Discussed use of lightweight wheelchair and walker Discussed generalized weakness and patient's fear of falling Discussed upcoming appointments Referral to Care Guide for transportation assistance and patient has been contacted Previously provided with RNCM contact information and encouraged to reach out as needed  Patient Self Care Activities:  Performs most ADLs independently with some assistance Unable to independently drive  Please see past updates related to this goal by clicking on the "Past Updates" button in the selected goal         Plan:   The care management team will reach out to the patient again over the next 30 days.   [name], BSN, RN-BC Embedded Chronic Care Manager [facility] [place] [facility] Direct Dial: [phone]   I have reviewed the CCM documentation and agree with the written assessment and plan of care.  [name], FNP

## 2019-10-12 NOTE — Patient Instructions (Signed)
Visit Information  Goals Addressed              This Visit's Progress     Patient Stated   .  "I need help with Transportation" (pt-stated)        [place] (see longitudinal plan of care for additional care plan information) Transportation assistance needs in a patient with Fibromyalgia, generalized weakness, Rheumatoid arthritis,osteoarthritis,Depression, Raynaud's disease, Sjogren's syndrome, afib, asthma, and hypothyroidism  Current Barriers:  Marland Kitchen Knowledge Deficits related to transportation assistance . Lacks caregiver support.  . Film/video editor.  . Transportation barriers  Nurse Case Manager Clinical Goal(s):  Marland Kitchen Over the next 10 days, patient will talk with [place] regarding transportation assistance  Interventions:  . Inter-disciplinary care team collaboration (see longitudinal plan of care) . Chart reviewed including recent office notes and lab results . Previously consulted by Victorino December, NP with [facility] . Talked with patient by telephone . Discussed transportation limitations . Discussed family involvement o They assist with transportation when able but they aren't always available . Discussed physical mobility limitations . Discussed use of lightweight wheelchair and walker . Discussed generalized weakness and patient's fear of falling . Discussed upcoming appointments . Referral to Care Guide for transportation assistance and patient has been contacted . Previously provided with [facility] contact information and encouraged to reach out as needed  Patient Self Care Activities:  . Performs most ADLs independently with some assistance . Unable to independently drive  Please see past updates related to this goal by clicking on the "Past Updates" button in the selected goal         Patient verbalizes understanding of instructions provided today.   Follow-up Plan The care management team will  reach out to the patient again over the next 30 days.   [name], BSN, RN-BC Embedded Chronic Care Manager [facility] [place] [facility] Direct Dial: [phone]

## 2019-10-13 DIAGNOSIS — R001 Bradycardia, unspecified: Secondary | ICD-10-CM | POA: Diagnosis not present

## 2019-10-14 DIAGNOSIS — I10 Essential (primary) hypertension: Secondary | ICD-10-CM | POA: Diagnosis not present

## 2019-10-14 DIAGNOSIS — I4821 Permanent atrial fibrillation: Secondary | ICD-10-CM | POA: Diagnosis not present

## 2019-10-21 DIAGNOSIS — I420 Dilated cardiomyopathy: Secondary | ICD-10-CM | POA: Diagnosis not present

## 2019-10-21 DIAGNOSIS — R140 Abdominal distension (gaseous): Secondary | ICD-10-CM | POA: Diagnosis not present

## 2019-10-21 DIAGNOSIS — M0579 Rheumatoid arthritis with rheumatoid factor of multiple sites without organ or systems involvement: Secondary | ICD-10-CM | POA: Diagnosis not present

## 2019-10-21 DIAGNOSIS — Z515 Encounter for palliative care: Secondary | ICD-10-CM | POA: Diagnosis not present

## 2019-10-24 IMAGING — DX DG RIBS 2V*L*
2 series · 2 of 2 positions shown · non-contrast
Comparison: Chest radiograph and CTA 12/24/2017

CLINICAL DATA: Anterior left lower rib pain.

EXAM:
LEFT RIBS - 2 VIEW

[rib pa]
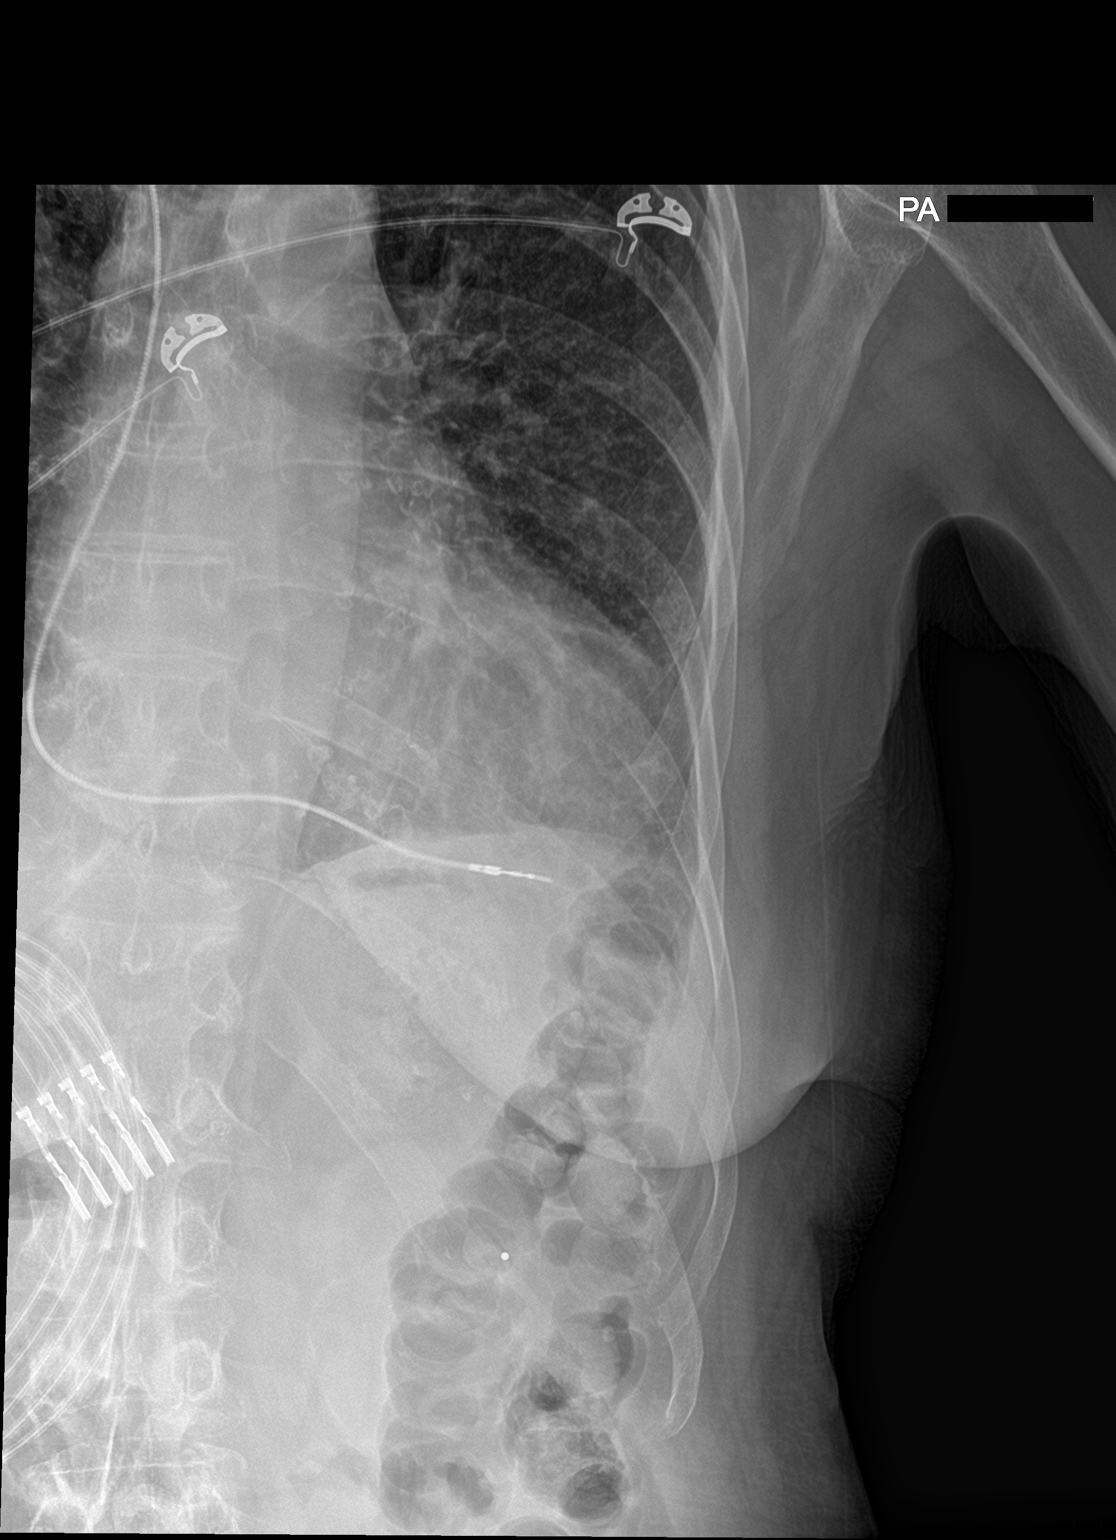

[rib pa obl]
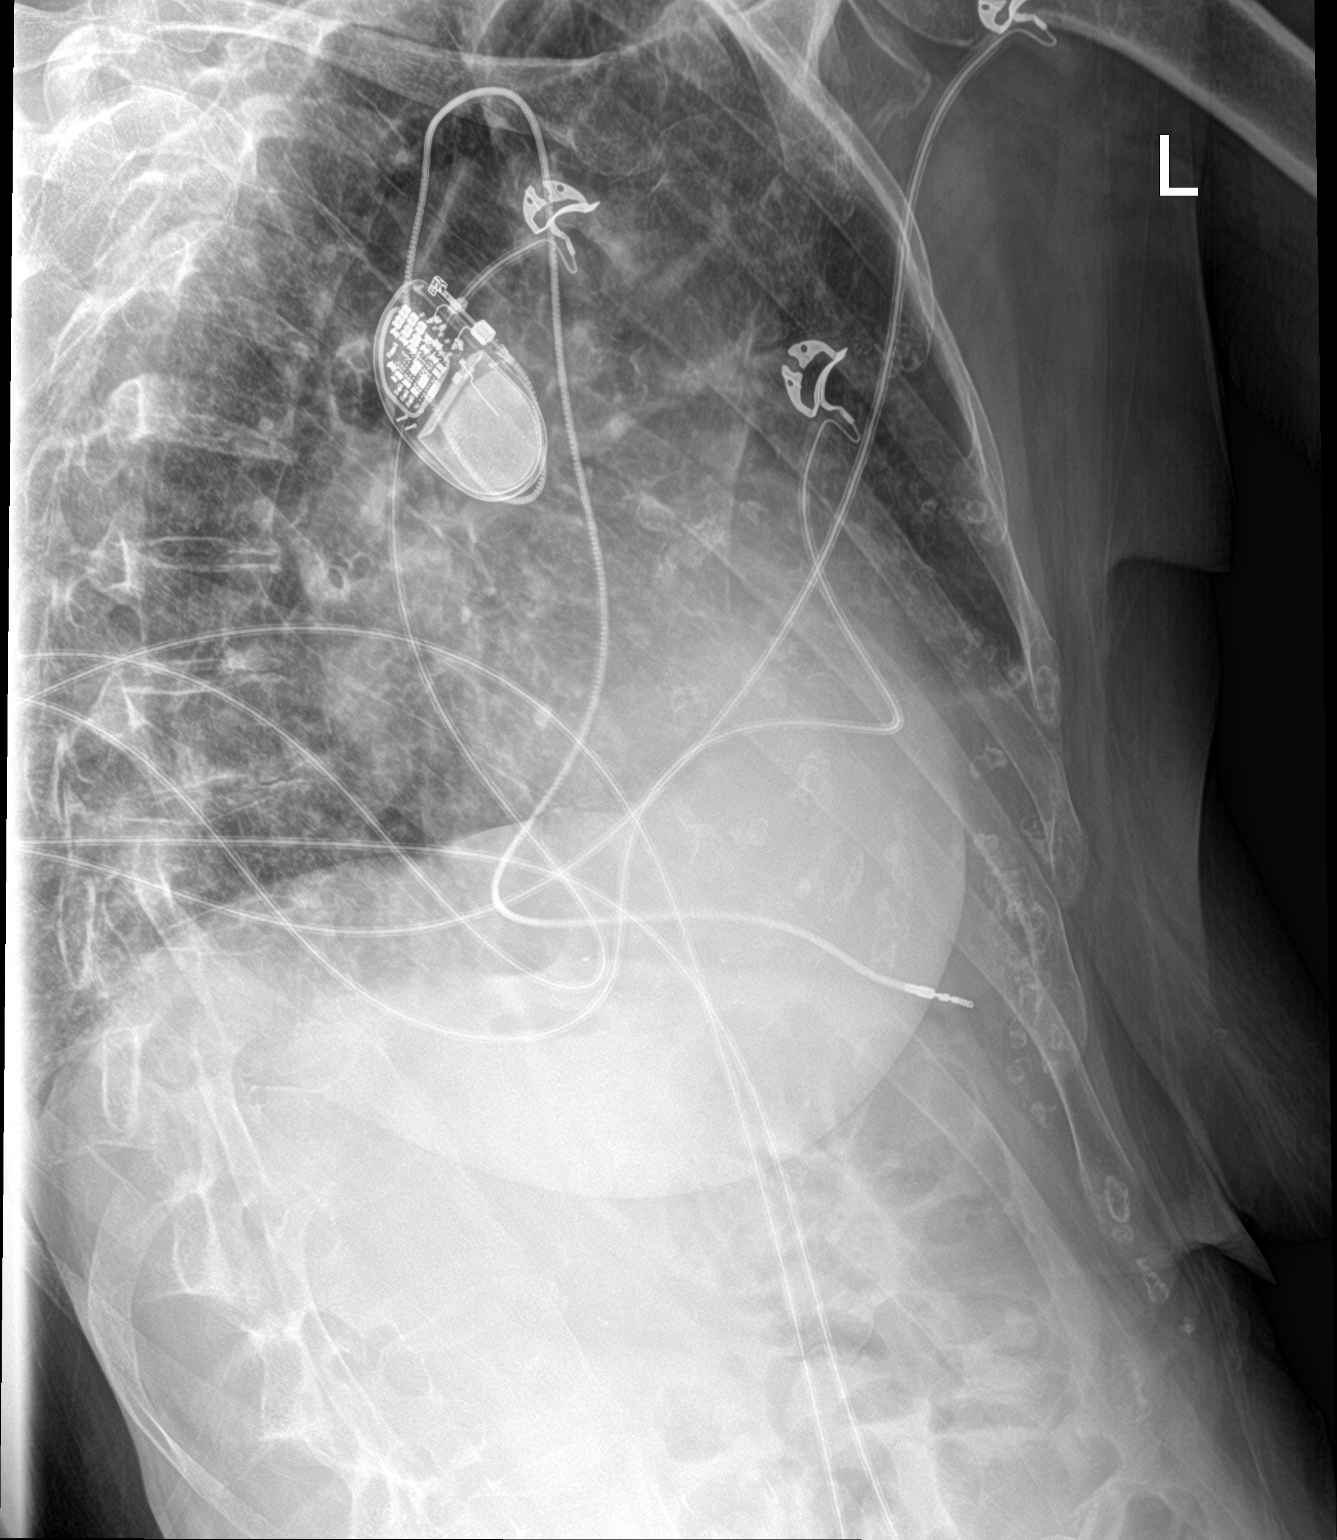

[2 of 2 positions shown; findings below may reference images not displayed]

FINDINGS: No left-sided rib fracture is identified. A single lead pacemaker is
noted. The lungs were more fully evaluated on earlier chest
radiograph and CT.
IMPRESSION: No rib fracture identified.

## 2019-10-25 ENCOUNTER — Telehealth: Payer: Self-pay

## 2019-10-25 ENCOUNTER — Ambulatory Visit: Payer: Medicare Other

## 2019-10-25 DIAGNOSIS — R399 Unspecified symptoms and signs involving the genitourinary system: Secondary | ICD-10-CM

## 2019-10-25 NOTE — Telephone Encounter (Signed)
Pt wants an order to have her urine checked to bladder infection. I told her she needed an apt for that. She says she is too weak to come in and her aid would bring the urine. Please call back

## 2019-10-25 NOTE — Telephone Encounter (Signed)
Urinalysis ordered

## 2019-10-25 NOTE — Telephone Encounter (Signed)
Patient aware and verbalized understanding. °

## 2019-10-26 ENCOUNTER — Other Ambulatory Visit: Payer: Medicare Other

## 2019-10-26 ENCOUNTER — Other Ambulatory Visit: Payer: Self-pay

## 2019-10-26 DIAGNOSIS — Z7901 Long term (current) use of anticoagulants: Secondary | ICD-10-CM | POA: Diagnosis not present

## 2019-10-26 DIAGNOSIS — I4821 Permanent atrial fibrillation: Secondary | ICD-10-CM | POA: Diagnosis not present

## 2019-10-27 ENCOUNTER — Telehealth: Payer: Self-pay | Admitting: Family Medicine

## 2019-10-27 DIAGNOSIS — R399 Unspecified symptoms and signs involving the genitourinary system: Secondary | ICD-10-CM | POA: Diagnosis not present

## 2019-10-27 LAB — URINALYSIS, COMPLETE
Bilirubin, UA: NEGATIVE
Glucose, UA: NEGATIVE
Ketones, UA: NEGATIVE
Leukocytes,UA: NEGATIVE
Nitrite, UA: NEGATIVE
Protein,UA: NEGATIVE
RBC, UA: NEGATIVE
Specific Gravity, UA: <1.005 — ABNORMAL LOW (ref 1.005–1.030)
Urobilinogen, Ur: 0.2 mg/dL (ref 0.2–1.0)
pH, UA: 5.5 (ref 5.0–7.5)

## 2019-10-27 LAB — MICROSCOPIC EXAMINATION
Bacteria, UA: NONE SEEN
RBC, Urine: NONE SEEN /hpf (ref 0–2)
WBC, UA: NONE SEEN /hpf (ref 0–5)

## 2019-10-27 NOTE — Telephone Encounter (Signed)
Urine was normal. No infection

## 2019-10-29 DIAGNOSIS — Z515 Encounter for palliative care: Secondary | ICD-10-CM | POA: Diagnosis not present

## 2019-10-29 DIAGNOSIS — M0579 Rheumatoid arthritis with rheumatoid factor of multiple sites without organ or systems involvement: Secondary | ICD-10-CM | POA: Diagnosis not present

## 2019-10-29 DIAGNOSIS — I420 Dilated cardiomyopathy: Secondary | ICD-10-CM | POA: Diagnosis not present

## 2019-10-29 DIAGNOSIS — R140 Abdominal distension (gaseous): Secondary | ICD-10-CM | POA: Diagnosis not present

## 2019-10-29 LAB — URINE CULTURE

## 2019-11-01 ENCOUNTER — Other Ambulatory Visit: Payer: Self-pay | Admitting: Family

## 2019-11-01 ENCOUNTER — Other Ambulatory Visit: Payer: Self-pay | Admitting: Nurse Practitioner

## 2019-11-01 DIAGNOSIS — J329 Chronic sinusitis, unspecified: Secondary | ICD-10-CM

## 2019-11-02 ENCOUNTER — Telehealth: Payer: Self-pay | Admitting: *Deleted

## 2019-11-02 DIAGNOSIS — I4821 Permanent atrial fibrillation: Secondary | ICD-10-CM

## 2019-11-02 NOTE — Telephone Encounter (Signed)
°   Hold today then 2mg  daily except 4mg  on M,W and F- recheck in 1 week

## 2019-11-02 NOTE — Telephone Encounter (Signed)
Fax received mdINR PT/INR self testing service Test date/time 11/02/19 1032 am INR 3.3

## 2019-11-04 ENCOUNTER — Ambulatory Visit: Payer: Medicare Other

## 2019-11-05 DIAGNOSIS — R54 Age-related physical debility: Secondary | ICD-10-CM | POA: Diagnosis not present

## 2019-11-05 DIAGNOSIS — Z515 Encounter for palliative care: Secondary | ICD-10-CM | POA: Diagnosis not present

## 2019-11-05 DIAGNOSIS — M62838 Other muscle spasm: Secondary | ICD-10-CM | POA: Diagnosis not present

## 2019-11-05 DIAGNOSIS — M0579 Rheumatoid arthritis with rheumatoid factor of multiple sites without organ or systems involvement: Secondary | ICD-10-CM | POA: Diagnosis not present

## 2019-11-05 DIAGNOSIS — K589 Irritable bowel syndrome without diarrhea: Secondary | ICD-10-CM | POA: Diagnosis not present

## 2019-11-05 DIAGNOSIS — I420 Dilated cardiomyopathy: Secondary | ICD-10-CM | POA: Diagnosis not present

## 2019-11-09 ENCOUNTER — Telehealth: Payer: Self-pay | Admitting: *Deleted

## 2019-11-09 DIAGNOSIS — I4821 Permanent atrial fibrillation: Secondary | ICD-10-CM

## 2019-11-09 NOTE — Telephone Encounter (Signed)
INR was 3.5 today (goal is 2.0 to 3.0)    Hold today (11/09/19) then 2mg  daily except 4mg  on Wednesday- recheck in 1 week

## 2019-11-09 NOTE — Telephone Encounter (Signed)
Patient aware.

## 2019-11-09 NOTE — Telephone Encounter (Signed)
Ok this is fine 

## 2019-11-09 NOTE — Telephone Encounter (Signed)
Patient states that this dose last time made her blood to thick.  And usually the dose would be changed to hold today's dose and 4 mg Wed & Friday and 2mg  all other days

## 2019-11-09 NOTE — Telephone Encounter (Signed)
Fax received mdINR PT/INR self testing service Test date/time 11/09/19 1013 am INR 3.5

## 2019-11-12 ENCOUNTER — Other Ambulatory Visit: Payer: Self-pay | Admitting: Family

## 2019-11-12 DIAGNOSIS — M0579 Rheumatoid arthritis with rheumatoid factor of multiple sites without organ or systems involvement: Secondary | ICD-10-CM | POA: Diagnosis not present

## 2019-11-12 DIAGNOSIS — I420 Dilated cardiomyopathy: Secondary | ICD-10-CM | POA: Diagnosis not present

## 2019-11-12 DIAGNOSIS — M3503 Sicca syndrome with myopathy: Secondary | ICD-10-CM | POA: Diagnosis not present

## 2019-11-12 DIAGNOSIS — Z515 Encounter for palliative care: Secondary | ICD-10-CM | POA: Diagnosis not present

## 2019-11-12 DIAGNOSIS — M3508 Sjogren syndrome with gastrointestinal involvement: Secondary | ICD-10-CM | POA: Diagnosis not present

## 2019-11-23 ENCOUNTER — Telehealth: Payer: Self-pay | Admitting: *Deleted

## 2019-11-23 DIAGNOSIS — I4821 Permanent atrial fibrillation: Secondary | ICD-10-CM | POA: Diagnosis not present

## 2019-11-23 DIAGNOSIS — Z7901 Long term (current) use of anticoagulants: Secondary | ICD-10-CM | POA: Diagnosis not present

## 2019-11-23 NOTE — Telephone Encounter (Signed)
Fax received mdINR PT/INR self testing service Test date/time 10/23/19 1014 am INR 3.7

## 2019-11-23 NOTE — Telephone Encounter (Signed)
Patient states that updated coumadin dosage will thin blood too much.  Patient would like to know if she can hold todays dose and eat a small salad and resume 4mg  MWF and all other days 2mg 

## 2019-11-23 NOTE — Telephone Encounter (Signed)
Less of warfarin will make her blood thicker not thinner. Last week her INR was 3.5 and she did not want decrease her dose. This week her INR 3.7. I highly recommend decreasing her dose to 2 mg daily or 2 mg daily and 4 mg on Wednesday. Please let me know what she will do so I can update her chart.

## 2019-11-23 NOTE — Telephone Encounter (Signed)
Pt is concerned about taking 2mg  daily of blood thinner. She is afraid that her blood will be too thin. She is worried about a heart attack.

## 2019-11-23 NOTE — Telephone Encounter (Signed)
Patient states that INR on Tuesday 11/16/19 was 2.1. No results noted in chart from last week

## 2019-11-23 NOTE — Telephone Encounter (Signed)
INR was  3.7 today (goal is 2.0 to 3.0)    Hold today (11/23/19) then decrease to 2mg  daily- recheck in 1 week

## 2019-12-02 ENCOUNTER — Ambulatory Visit (INDEPENDENT_AMBULATORY_CARE_PROVIDER_SITE_OTHER): Payer: Medicare Other

## 2019-12-02 ENCOUNTER — Other Ambulatory Visit: Payer: Self-pay

## 2019-12-02 DIAGNOSIS — Z23 Encounter for immunization: Secondary | ICD-10-CM | POA: Diagnosis not present

## 2019-12-02 NOTE — Progress Notes (Signed)
   Covid-19 Vaccination Clinic  Name:  Angela Wong    MRN: [id number] DOB: 08/09/1940  12/02/2019  [name] was observed post Covid-19 immunization for 30 minutes based on pre-vaccination screening without incident. She was provided with Vaccine Information Sheet and instruction to access the V-Safe system.   [name] was instructed to call 911 with any severe reactions post vaccine: Marland Kitchen Difficulty breathing  . Swelling of face and throat  . A fast heartbeat  . A bad rash all over body  . Dizziness and weakness   Immunizations Administered    Name Date Dose VIS Date Route   Pfizer COVID-19 Vaccine 12/02/2019  9:30 AM 0.3 mL 11/03/2019 Intramuscular   Manufacturer: [place]   Lot: [id number]   NDC: [address]-4

## 2019-12-06 DIAGNOSIS — L89150 Pressure ulcer of sacral region, unstageable: Secondary | ICD-10-CM | POA: Diagnosis not present

## 2019-12-06 DIAGNOSIS — M0579 Rheumatoid arthritis with rheumatoid factor of multiple sites without organ or systems involvement: Secondary | ICD-10-CM | POA: Diagnosis not present

## 2019-12-06 DIAGNOSIS — I420 Dilated cardiomyopathy: Secondary | ICD-10-CM | POA: Diagnosis not present

## 2019-12-06 DIAGNOSIS — Z515 Encounter for palliative care: Secondary | ICD-10-CM | POA: Diagnosis not present

## 2019-12-14 DIAGNOSIS — L89150 Pressure ulcer of sacral region, unstageable: Secondary | ICD-10-CM | POA: Diagnosis not present

## 2019-12-14 DIAGNOSIS — I420 Dilated cardiomyopathy: Secondary | ICD-10-CM | POA: Diagnosis not present

## 2019-12-14 DIAGNOSIS — M0579 Rheumatoid arthritis with rheumatoid factor of multiple sites without organ or systems involvement: Secondary | ICD-10-CM | POA: Diagnosis not present

## 2019-12-14 DIAGNOSIS — Z515 Encounter for palliative care: Secondary | ICD-10-CM | POA: Diagnosis not present

## 2019-12-21 ENCOUNTER — Telehealth: Payer: Self-pay | Admitting: *Deleted

## 2019-12-21 ENCOUNTER — Telehealth: Payer: Self-pay

## 2019-12-21 DIAGNOSIS — I4821 Permanent atrial fibrillation: Secondary | ICD-10-CM | POA: Diagnosis not present

## 2019-12-21 DIAGNOSIS — Z7901 Long term (current) use of anticoagulants: Secondary | ICD-10-CM | POA: Diagnosis not present

## 2019-12-21 NOTE — Telephone Encounter (Signed)
Could not get previous note to open- patient called back and states that she will do what Angela Wong said bc she had a eye bleed. Disregard previous message

## 2019-12-21 NOTE — Telephone Encounter (Signed)
Patient aware and verbalizes understanding- Patient states the new dose will not work.  When asking her why she states that if she goes down to 2mg  daily then she will drop down below 2 and has had a heart attack at 1.6 before.  Would like to hold today and resume normal dose- please advise

## 2019-12-21 NOTE — Telephone Encounter (Signed)
Refer to previous note.

## 2019-12-21 NOTE — Telephone Encounter (Signed)
Fax received mdINR PT/INR self testing service Test date/time 12/21/19 1000 am INR 3.1

## 2019-12-21 NOTE — Telephone Encounter (Signed)
Ok

## 2019-12-21 NOTE — Telephone Encounter (Signed)
Attempted to contact patient - NA °

## 2019-12-21 NOTE — Telephone Encounter (Signed)
Description   INR was  3.1 today (goal is 2.0 to 3.0)    Hold today (12/21/19) then continue 2 mg daily- recheck in 1 week

## 2020-01-04 ENCOUNTER — Telehealth: Payer: Self-pay | Admitting: *Deleted

## 2020-01-04 DIAGNOSIS — I4821 Permanent atrial fibrillation: Secondary | ICD-10-CM

## 2020-01-04 NOTE — Telephone Encounter (Signed)
Fax received mdINR PT/INR self testing service Test date/time 01/04/20 1048 am INR 3.8

## 2020-01-04 NOTE — Telephone Encounter (Signed)
Description   INR was   3.8 today (goal is 2.0 to 3.0)    Hold today (12/2119) then decrease to 1 mg on Monday & Friday, then continue 2 mg all other days.

## 2020-01-04 NOTE — Telephone Encounter (Signed)
Patient aware and verbalizes understanding. 

## 2020-01-06 DIAGNOSIS — I420 Dilated cardiomyopathy: Secondary | ICD-10-CM | POA: Diagnosis not present

## 2020-01-06 DIAGNOSIS — M3503 Sicca syndrome with myopathy: Secondary | ICD-10-CM | POA: Diagnosis not present

## 2020-01-06 DIAGNOSIS — M62838 Other muscle spasm: Secondary | ICD-10-CM | POA: Diagnosis not present

## 2020-01-06 DIAGNOSIS — R54 Age-related physical debility: Secondary | ICD-10-CM | POA: Diagnosis not present

## 2020-01-06 DIAGNOSIS — M0579 Rheumatoid arthritis with rheumatoid factor of multiple sites without organ or systems involvement: Secondary | ICD-10-CM | POA: Diagnosis not present

## 2020-01-06 DIAGNOSIS — Z515 Encounter for palliative care: Secondary | ICD-10-CM | POA: Diagnosis not present

## 2020-01-11 ENCOUNTER — Telehealth: Payer: Self-pay | Admitting: *Deleted

## 2020-01-11 DIAGNOSIS — I4821 Permanent atrial fibrillation: Secondary | ICD-10-CM

## 2020-01-11 NOTE — Telephone Encounter (Signed)
Patient states its too thick and she wants to [facility] Monday, Wednesday Friday 4mg  and then 2mg  rest of the week. Please advise. She is afraid she will have a heart attack.

## 2020-01-11 NOTE — Telephone Encounter (Signed)
Patients states she needs home health she needs health to help with getting her foods to eat

## 2020-01-11 NOTE — Telephone Encounter (Signed)
I think taking 4 mg Monday and Wednesday will increase it too much since you were only taking 1 mg on Monday and Friday prior.   We can increase to 2 mg every day.

## 2020-01-11 NOTE — Telephone Encounter (Signed)
Fax received 12/16/19 @ 900 AM mdINR PT/INR self testing service Test date/time 01/11/20 @ 857 AM INR 1.5

## 2020-01-11 NOTE — Telephone Encounter (Signed)
Description   INR was    1.5 today (goal is 2.0 to 3.0)   Change to  1 mg on Monday, then continue 2 mg all other days.

## 2020-01-11 NOTE — Telephone Encounter (Signed)
Spoke with Neysa Bonito she said 3 mg today and 2 every day and rck next week.

## 2020-01-16 DIAGNOSIS — R001 Bradycardia, unspecified: Secondary | ICD-10-CM | POA: Diagnosis not present

## 2020-01-18 ENCOUNTER — Telehealth: Payer: Self-pay | Admitting: *Deleted

## 2020-01-18 DIAGNOSIS — I4821 Permanent atrial fibrillation: Secondary | ICD-10-CM

## 2020-01-18 NOTE — Telephone Encounter (Signed)
Patient aware and verbalizes understanding - states that she would like to go back to eating her greens like she was.  Please advise

## 2020-01-18 NOTE — Telephone Encounter (Signed)
Description   INR was    1.6 today (goal is 2.0 to 3.0)   Take 3 mg today (01/18/20) then change to 3 mg on Wednesday and 2 mg all other days.

## 2020-01-18 NOTE — Telephone Encounter (Signed)
Fax received mdINR PT/INR self testing service Test date/time 01/18/20 1003 am INR 1.6

## 2020-01-20 ENCOUNTER — Other Ambulatory Visit: Payer: Self-pay | Admitting: Cardiology

## 2020-01-20 NOTE — Telephone Encounter (Signed)
Patient aware.

## 2020-01-20 NOTE — Telephone Encounter (Signed)
This is fine and we will adjust accordingly.

## 2020-01-21 DIAGNOSIS — M0579 Rheumatoid arthritis with rheumatoid factor of multiple sites without organ or systems involvement: Secondary | ICD-10-CM | POA: Diagnosis not present

## 2020-01-21 DIAGNOSIS — I420 Dilated cardiomyopathy: Secondary | ICD-10-CM | POA: Diagnosis not present

## 2020-01-21 DIAGNOSIS — L89150 Pressure ulcer of sacral region, unstageable: Secondary | ICD-10-CM | POA: Diagnosis not present

## 2020-01-21 DIAGNOSIS — R54 Age-related physical debility: Secondary | ICD-10-CM | POA: Diagnosis not present

## 2020-01-21 DIAGNOSIS — M3503 Sicca syndrome with myopathy: Secondary | ICD-10-CM | POA: Diagnosis not present

## 2020-01-21 DIAGNOSIS — Z515 Encounter for palliative care: Secondary | ICD-10-CM | POA: Diagnosis not present

## 2020-01-21 DIAGNOSIS — M62838 Other muscle spasm: Secondary | ICD-10-CM | POA: Diagnosis not present

## 2020-01-21 NOTE — Telephone Encounter (Signed)
Pt follows with [facility], and has INRs managed by [facility]. Warfarin refill needs to be sent to their office for refill, will forward in Epic.Marland Kitchen

## 2020-01-21 NOTE — Telephone Encounter (Signed)
INRs are faxed her by [facility] Please verify dose for refill

## 2020-02-04 DIAGNOSIS — M62838 Other muscle spasm: Secondary | ICD-10-CM | POA: Diagnosis not present

## 2020-02-04 DIAGNOSIS — M0579 Rheumatoid arthritis with rheumatoid factor of multiple sites without organ or systems involvement: Secondary | ICD-10-CM | POA: Diagnosis not present

## 2020-02-04 DIAGNOSIS — R54 Age-related physical debility: Secondary | ICD-10-CM | POA: Diagnosis not present

## 2020-02-04 DIAGNOSIS — M3508 Sjogren syndrome with gastrointestinal involvement: Secondary | ICD-10-CM | POA: Diagnosis not present

## 2020-02-04 DIAGNOSIS — M3503 Sicca syndrome with myopathy: Secondary | ICD-10-CM | POA: Diagnosis not present

## 2020-02-04 DIAGNOSIS — L89150 Pressure ulcer of sacral region, unstageable: Secondary | ICD-10-CM | POA: Diagnosis not present

## 2020-02-04 DIAGNOSIS — Z515 Encounter for palliative care: Secondary | ICD-10-CM | POA: Diagnosis not present

## 2020-02-04 DIAGNOSIS — I420 Dilated cardiomyopathy: Secondary | ICD-10-CM | POA: Diagnosis not present

## 2020-02-08 ENCOUNTER — Telehealth: Payer: Self-pay | Admitting: *Deleted

## 2020-02-08 DIAGNOSIS — I4821 Permanent atrial fibrillation: Secondary | ICD-10-CM

## 2020-02-08 DIAGNOSIS — K5900 Constipation, unspecified: Secondary | ICD-10-CM | POA: Diagnosis not present

## 2020-02-08 DIAGNOSIS — Z515 Encounter for palliative care: Secondary | ICD-10-CM | POA: Diagnosis not present

## 2020-02-08 DIAGNOSIS — K645 Perianal venous thrombosis: Secondary | ICD-10-CM | POA: Diagnosis not present

## 2020-02-08 MED ORDER — WARFARIN SODIUM 1 MG PO TABS: ORAL_TABLET | ORAL | 3 refills | Status: DC

## 2020-02-08 NOTE — Telephone Encounter (Signed)
Her INR is too thin. We should not increase to 4 mg. However, if she has been taking like that, it is fine to continue.

## 2020-02-08 NOTE — Telephone Encounter (Signed)
Pt wants to know if she can hold tonights dose, then go back to her usual 4 mg on M, W, F & 2 mg all other days She said she didn't eat a big enough salad last night

## 2020-02-08 NOTE — Telephone Encounter (Signed)
Description   INR was    3.1 today (goal is 2.0 to 3.0)   Hold today's dose ( 02/08/20), then continue  3 mg on Wednesday and 2 mg all other days.

## 2020-02-08 NOTE — Telephone Encounter (Signed)
Left detailed message with changes - aware to also call the office back to confirm she understands.

## 2020-02-08 NOTE — Telephone Encounter (Signed)
Pt aware of [facility] recommendations

## 2020-02-08 NOTE — Telephone Encounter (Signed)
Fax received mdINR PT/INR self testing service Test date/time 02/08/20 1017 am INR 3.1

## 2020-02-08 NOTE — Addendum Note (Signed)
Addended by: Zannie Cove on: 02/08/2020 04:26 PM   Modules accepted: Orders

## 2020-02-15 DIAGNOSIS — Z7901 Long term (current) use of anticoagulants: Secondary | ICD-10-CM | POA: Diagnosis not present

## 2020-02-15 DIAGNOSIS — I4821 Permanent atrial fibrillation: Secondary | ICD-10-CM | POA: Diagnosis not present

## 2020-02-17 ENCOUNTER — Other Ambulatory Visit: Payer: Self-pay | Admitting: Family

## 2020-02-17 DIAGNOSIS — G4700 Insomnia, unspecified: Secondary | ICD-10-CM

## 2020-02-18 ENCOUNTER — Other Ambulatory Visit: Payer: Self-pay | Admitting: Family

## 2020-02-18 DIAGNOSIS — G4700 Insomnia, unspecified: Secondary | ICD-10-CM

## 2020-02-18 DIAGNOSIS — I420 Dilated cardiomyopathy: Secondary | ICD-10-CM | POA: Diagnosis not present

## 2020-02-18 DIAGNOSIS — Z515 Encounter for palliative care: Secondary | ICD-10-CM | POA: Diagnosis not present

## 2020-02-18 DIAGNOSIS — R54 Age-related physical debility: Secondary | ICD-10-CM | POA: Diagnosis not present

## 2020-02-18 DIAGNOSIS — M3508 Sjogren syndrome with gastrointestinal involvement: Secondary | ICD-10-CM | POA: Diagnosis not present

## 2020-02-18 DIAGNOSIS — F4312 Post-traumatic stress disorder, chronic: Secondary | ICD-10-CM | POA: Diagnosis not present

## 2020-02-18 DIAGNOSIS — L89150 Pressure ulcer of sacral region, unstageable: Secondary | ICD-10-CM | POA: Diagnosis not present

## 2020-02-28 ENCOUNTER — Other Ambulatory Visit: Payer: Self-pay | Admitting: Family

## 2020-02-28 DIAGNOSIS — R531 Weakness: Secondary | ICD-10-CM

## 2020-03-03 DIAGNOSIS — I420 Dilated cardiomyopathy: Secondary | ICD-10-CM | POA: Diagnosis not present

## 2020-03-03 DIAGNOSIS — M3508 Sjogren syndrome with gastrointestinal involvement: Secondary | ICD-10-CM | POA: Diagnosis not present

## 2020-03-03 DIAGNOSIS — Z515 Encounter for palliative care: Secondary | ICD-10-CM | POA: Diagnosis not present

## 2020-03-03 DIAGNOSIS — M0579 Rheumatoid arthritis with rheumatoid factor of multiple sites without organ or systems involvement: Secondary | ICD-10-CM | POA: Diagnosis not present

## 2020-03-03 DIAGNOSIS — L89150 Pressure ulcer of sacral region, unstageable: Secondary | ICD-10-CM | POA: Diagnosis not present

## 2020-03-03 DIAGNOSIS — M62838 Other muscle spasm: Secondary | ICD-10-CM | POA: Diagnosis not present

## 2020-03-14 DIAGNOSIS — I4821 Permanent atrial fibrillation: Secondary | ICD-10-CM | POA: Diagnosis not present

## 2020-03-14 DIAGNOSIS — Z7901 Long term (current) use of anticoagulants: Secondary | ICD-10-CM | POA: Diagnosis not present

## 2020-03-17 DIAGNOSIS — M3508 Sjogren syndrome with gastrointestinal involvement: Secondary | ICD-10-CM | POA: Diagnosis not present

## 2020-03-17 DIAGNOSIS — M62838 Other muscle spasm: Secondary | ICD-10-CM | POA: Diagnosis not present

## 2020-03-17 DIAGNOSIS — L89150 Pressure ulcer of sacral region, unstageable: Secondary | ICD-10-CM | POA: Diagnosis not present

## 2020-03-17 DIAGNOSIS — I420 Dilated cardiomyopathy: Secondary | ICD-10-CM | POA: Diagnosis not present

## 2020-03-17 DIAGNOSIS — Z515 Encounter for palliative care: Secondary | ICD-10-CM | POA: Diagnosis not present

## 2020-03-17 DIAGNOSIS — M0579 Rheumatoid arthritis with rheumatoid factor of multiple sites without organ or systems involvement: Secondary | ICD-10-CM | POA: Diagnosis not present

## 2020-03-20 ENCOUNTER — Other Ambulatory Visit: Payer: Self-pay | Admitting: Allergy & Immunology

## 2020-03-21 ENCOUNTER — Other Ambulatory Visit: Payer: Self-pay | Admitting: *Deleted

## 2020-03-21 MED ORDER — MONTELUKAST SODIUM 10 MG PO TABS
10.0000 mg | ORAL_TABLET | Freq: Every day | ORAL | 0 refills | Status: DC
Start: 2020-03-21 — End: 2020-09-29

## 2020-03-28 ENCOUNTER — Telehealth: Payer: Self-pay | Admitting: *Deleted

## 2020-03-28 DIAGNOSIS — I4821 Permanent atrial fibrillation: Secondary | ICD-10-CM

## 2020-03-28 NOTE — Telephone Encounter (Signed)
Description   INR was 1.9 today (goal is 2.0 to 3.0)   Take 3 mg today, then Continue  3 mg on Wednesday and 2 mg all other days.   Recheck in 2 weeks.

## 2020-03-28 NOTE — Telephone Encounter (Signed)
Pt has had a lot of rotten salads lately and gotten sick, and has been eating spinach Ok'd with Christy to not make any changes at this time and to see what her level is at her next check

## 2020-03-28 NOTE — Telephone Encounter (Signed)
Fax received mdINR PT/INR self testing service Test date/time 03/28/20 1001 am INR 1.9

## 2020-03-31 DIAGNOSIS — Z515 Encounter for palliative care: Secondary | ICD-10-CM | POA: Diagnosis not present

## 2020-03-31 DIAGNOSIS — M0579 Rheumatoid arthritis with rheumatoid factor of multiple sites without organ or systems involvement: Secondary | ICD-10-CM | POA: Diagnosis not present

## 2020-03-31 DIAGNOSIS — I420 Dilated cardiomyopathy: Secondary | ICD-10-CM | POA: Diagnosis not present

## 2020-03-31 DIAGNOSIS — L89150 Pressure ulcer of sacral region, unstageable: Secondary | ICD-10-CM | POA: Diagnosis not present

## 2020-03-31 DIAGNOSIS — M62838 Other muscle spasm: Secondary | ICD-10-CM | POA: Diagnosis not present

## 2020-04-11 ENCOUNTER — Telehealth: Payer: Self-pay | Admitting: *Deleted

## 2020-04-11 DIAGNOSIS — I4821 Permanent atrial fibrillation: Secondary | ICD-10-CM

## 2020-04-11 DIAGNOSIS — Z7901 Long term (current) use of anticoagulants: Secondary | ICD-10-CM | POA: Diagnosis not present

## 2020-04-11 NOTE — Telephone Encounter (Signed)
INR was 1.9 today (goal is 2.0 to 3.0)   Increase to 2mg  daily except 3mg  on wed and friday   Recheck in 2 weeks

## 2020-04-11 NOTE — Telephone Encounter (Signed)
Fax received mdINR PT/INR self testing service Test date/time 04/11/20 10:10 am INR 1.9

## 2020-04-11 NOTE — Telephone Encounter (Signed)
Please review

## 2020-04-11 NOTE — Telephone Encounter (Signed)
Patient aware of changes.

## 2020-04-14 DIAGNOSIS — L89150 Pressure ulcer of sacral region, unstageable: Secondary | ICD-10-CM | POA: Diagnosis not present

## 2020-04-14 DIAGNOSIS — M62838 Other muscle spasm: Secondary | ICD-10-CM | POA: Diagnosis not present

## 2020-04-14 DIAGNOSIS — I420 Dilated cardiomyopathy: Secondary | ICD-10-CM | POA: Diagnosis not present

## 2020-04-14 DIAGNOSIS — Z515 Encounter for palliative care: Secondary | ICD-10-CM | POA: Diagnosis not present

## 2020-04-14 DIAGNOSIS — M0579 Rheumatoid arthritis with rheumatoid factor of multiple sites without organ or systems involvement: Secondary | ICD-10-CM | POA: Diagnosis not present

## 2020-04-23 DIAGNOSIS — R001 Bradycardia, unspecified: Secondary | ICD-10-CM | POA: Diagnosis not present

## 2020-04-25 ENCOUNTER — Telehealth: Payer: Self-pay

## 2020-04-25 DIAGNOSIS — I4821 Permanent atrial fibrillation: Secondary | ICD-10-CM

## 2020-04-25 NOTE — Telephone Encounter (Signed)
Description   INR was 1.9 today (goal is 2.0 to 3.0)   Change 4mg  daily except 2mg  on sun, tues and thurs

## 2020-04-25 NOTE — Telephone Encounter (Signed)
Pt aware.

## 2020-04-25 NOTE — Telephone Encounter (Signed)
Fax received mdINR PT/INR self testing service Test date/time 04/25/20 9:51 am INR 1.9

## 2020-04-28 DIAGNOSIS — I252 Old myocardial infarction: Secondary | ICD-10-CM | POA: Diagnosis not present

## 2020-04-28 DIAGNOSIS — K589 Irritable bowel syndrome without diarrhea: Secondary | ICD-10-CM | POA: Diagnosis not present

## 2020-04-28 DIAGNOSIS — I058 Other rheumatic mitral valve diseases: Secondary | ICD-10-CM | POA: Diagnosis not present

## 2020-04-28 DIAGNOSIS — I495 Sick sinus syndrome: Secondary | ICD-10-CM | POA: Diagnosis not present

## 2020-04-28 DIAGNOSIS — I4821 Permanent atrial fibrillation: Secondary | ICD-10-CM | POA: Diagnosis not present

## 2020-04-28 DIAGNOSIS — K219 Gastro-esophageal reflux disease without esophagitis: Secondary | ICD-10-CM | POA: Diagnosis not present

## 2020-04-28 DIAGNOSIS — J449 Chronic obstructive pulmonary disease, unspecified: Secondary | ICD-10-CM | POA: Diagnosis not present

## 2020-04-28 DIAGNOSIS — G43909 Migraine, unspecified, not intractable, without status migrainosus: Secondary | ICD-10-CM | POA: Diagnosis not present

## 2020-04-28 DIAGNOSIS — F4310 Post-traumatic stress disorder, unspecified: Secondary | ICD-10-CM | POA: Diagnosis not present

## 2020-04-28 DIAGNOSIS — L89151 Pressure ulcer of sacral region, stage 1: Secondary | ICD-10-CM | POA: Diagnosis not present

## 2020-04-28 DIAGNOSIS — J302 Other seasonal allergic rhinitis: Secondary | ICD-10-CM | POA: Diagnosis not present

## 2020-04-28 DIAGNOSIS — G894 Chronic pain syndrome: Secondary | ICD-10-CM | POA: Diagnosis not present

## 2020-04-28 DIAGNOSIS — M1990 Unspecified osteoarthritis, unspecified site: Secondary | ICD-10-CM | POA: Diagnosis not present

## 2020-04-28 DIAGNOSIS — I2510 Atherosclerotic heart disease of native coronary artery without angina pectoris: Secondary | ICD-10-CM | POA: Diagnosis not present

## 2020-04-28 DIAGNOSIS — M3500 Sicca syndrome, unspecified: Secondary | ICD-10-CM | POA: Diagnosis not present

## 2020-04-28 DIAGNOSIS — I420 Dilated cardiomyopathy: Secondary | ICD-10-CM | POA: Diagnosis not present

## 2020-04-28 DIAGNOSIS — I10 Essential (primary) hypertension: Secondary | ICD-10-CM | POA: Diagnosis not present

## 2020-04-28 DIAGNOSIS — M797 Fibromyalgia: Secondary | ICD-10-CM | POA: Diagnosis not present

## 2020-04-28 DIAGNOSIS — I872 Venous insufficiency (chronic) (peripheral): Secondary | ICD-10-CM | POA: Diagnosis not present

## 2020-04-28 DIAGNOSIS — Z950 Presence of cardiac pacemaker: Secondary | ICD-10-CM | POA: Diagnosis not present

## 2020-04-28 DIAGNOSIS — M4800 Spinal stenosis, site unspecified: Secondary | ICD-10-CM | POA: Diagnosis not present

## 2020-04-28 DIAGNOSIS — I7300 Raynaud's syndrome without gangrene: Secondary | ICD-10-CM | POA: Diagnosis not present

## 2020-04-28 DIAGNOSIS — J4540 Moderate persistent asthma, uncomplicated: Secondary | ICD-10-CM | POA: Diagnosis not present

## 2020-04-28 DIAGNOSIS — M0579 Rheumatoid arthritis with rheumatoid factor of multiple sites without organ or systems involvement: Secondary | ICD-10-CM | POA: Diagnosis not present

## 2020-04-28 DIAGNOSIS — G2581 Restless legs syndrome: Secondary | ICD-10-CM | POA: Diagnosis not present

## 2020-05-01 DIAGNOSIS — J449 Chronic obstructive pulmonary disease, unspecified: Secondary | ICD-10-CM | POA: Diagnosis not present

## 2020-05-01 DIAGNOSIS — I420 Dilated cardiomyopathy: Secondary | ICD-10-CM | POA: Diagnosis not present

## 2020-05-01 DIAGNOSIS — I2510 Atherosclerotic heart disease of native coronary artery without angina pectoris: Secondary | ICD-10-CM | POA: Diagnosis not present

## 2020-05-01 DIAGNOSIS — I10 Essential (primary) hypertension: Secondary | ICD-10-CM | POA: Diagnosis not present

## 2020-05-01 DIAGNOSIS — L89151 Pressure ulcer of sacral region, stage 1: Secondary | ICD-10-CM | POA: Diagnosis not present

## 2020-05-01 DIAGNOSIS — M0579 Rheumatoid arthritis with rheumatoid factor of multiple sites without organ or systems involvement: Secondary | ICD-10-CM | POA: Diagnosis not present

## 2020-05-02 ENCOUNTER — Telehealth: Payer: Self-pay

## 2020-05-02 DIAGNOSIS — Z515 Encounter for palliative care: Secondary | ICD-10-CM | POA: Diagnosis not present

## 2020-05-02 DIAGNOSIS — I420 Dilated cardiomyopathy: Secondary | ICD-10-CM | POA: Diagnosis not present

## 2020-05-02 DIAGNOSIS — I4821 Permanent atrial fibrillation: Secondary | ICD-10-CM

## 2020-05-02 DIAGNOSIS — M0579 Rheumatoid arthritis with rheumatoid factor of multiple sites without organ or systems involvement: Secondary | ICD-10-CM | POA: Diagnosis not present

## 2020-05-02 DIAGNOSIS — L89150 Pressure ulcer of sacral region, unstageable: Secondary | ICD-10-CM | POA: Diagnosis not present

## 2020-05-02 DIAGNOSIS — M62838 Other muscle spasm: Secondary | ICD-10-CM | POA: Diagnosis not present

## 2020-05-02 NOTE — Telephone Encounter (Signed)
Patient aware and verbalized understanding. °

## 2020-05-02 NOTE — Telephone Encounter (Signed)
Description   INR was 3.6 today (goal is 2.0 to 3.0)   Change 4mg  daily except 2mg  on Tues , Thurs, and Saturday.

## 2020-05-02 NOTE — Telephone Encounter (Signed)
Fax received mdINR PT/INR self testing service Test date/time 05/02/20 9:43 am INR 3.6

## 2020-05-04 DIAGNOSIS — J449 Chronic obstructive pulmonary disease, unspecified: Secondary | ICD-10-CM | POA: Diagnosis not present

## 2020-05-04 DIAGNOSIS — I420 Dilated cardiomyopathy: Secondary | ICD-10-CM | POA: Diagnosis not present

## 2020-05-04 DIAGNOSIS — I10 Essential (primary) hypertension: Secondary | ICD-10-CM | POA: Diagnosis not present

## 2020-05-04 DIAGNOSIS — I2510 Atherosclerotic heart disease of native coronary artery without angina pectoris: Secondary | ICD-10-CM | POA: Diagnosis not present

## 2020-05-04 DIAGNOSIS — M0579 Rheumatoid arthritis with rheumatoid factor of multiple sites without organ or systems involvement: Secondary | ICD-10-CM | POA: Diagnosis not present

## 2020-05-04 DIAGNOSIS — L89151 Pressure ulcer of sacral region, stage 1: Secondary | ICD-10-CM | POA: Diagnosis not present

## 2020-05-06 ENCOUNTER — Other Ambulatory Visit: Payer: Self-pay | Admitting: Family

## 2020-05-06 ENCOUNTER — Other Ambulatory Visit: Payer: Self-pay | Admitting: Internal Medicine

## 2020-05-06 DIAGNOSIS — R531 Weakness: Secondary | ICD-10-CM

## 2020-05-08 DIAGNOSIS — J449 Chronic obstructive pulmonary disease, unspecified: Secondary | ICD-10-CM | POA: Diagnosis not present

## 2020-05-08 DIAGNOSIS — L89151 Pressure ulcer of sacral region, stage 1: Secondary | ICD-10-CM | POA: Diagnosis not present

## 2020-05-08 DIAGNOSIS — I420 Dilated cardiomyopathy: Secondary | ICD-10-CM | POA: Diagnosis not present

## 2020-05-08 DIAGNOSIS — I10 Essential (primary) hypertension: Secondary | ICD-10-CM | POA: Diagnosis not present

## 2020-05-08 DIAGNOSIS — I2510 Atherosclerotic heart disease of native coronary artery without angina pectoris: Secondary | ICD-10-CM | POA: Diagnosis not present

## 2020-05-08 DIAGNOSIS — M0579 Rheumatoid arthritis with rheumatoid factor of multiple sites without organ or systems involvement: Secondary | ICD-10-CM | POA: Diagnosis not present

## 2020-05-08 NOTE — Telephone Encounter (Signed)
[facility]. NTBS 30 days given 02/28/20

## 2020-05-09 DIAGNOSIS — I10 Essential (primary) hypertension: Secondary | ICD-10-CM | POA: Diagnosis not present

## 2020-05-09 DIAGNOSIS — I420 Dilated cardiomyopathy: Secondary | ICD-10-CM | POA: Diagnosis not present

## 2020-05-09 DIAGNOSIS — J449 Chronic obstructive pulmonary disease, unspecified: Secondary | ICD-10-CM | POA: Diagnosis not present

## 2020-05-09 DIAGNOSIS — L89151 Pressure ulcer of sacral region, stage 1: Secondary | ICD-10-CM | POA: Diagnosis not present

## 2020-05-09 DIAGNOSIS — I4821 Permanent atrial fibrillation: Secondary | ICD-10-CM | POA: Diagnosis not present

## 2020-05-09 DIAGNOSIS — M0579 Rheumatoid arthritis with rheumatoid factor of multiple sites without organ or systems involvement: Secondary | ICD-10-CM | POA: Diagnosis not present

## 2020-05-09 DIAGNOSIS — I2510 Atherosclerotic heart disease of native coronary artery without angina pectoris: Secondary | ICD-10-CM | POA: Diagnosis not present

## 2020-05-09 DIAGNOSIS — Z7901 Long term (current) use of anticoagulants: Secondary | ICD-10-CM | POA: Diagnosis not present

## 2020-05-11 DIAGNOSIS — I10 Essential (primary) hypertension: Secondary | ICD-10-CM | POA: Diagnosis not present

## 2020-05-11 DIAGNOSIS — L89151 Pressure ulcer of sacral region, stage 1: Secondary | ICD-10-CM | POA: Diagnosis not present

## 2020-05-11 DIAGNOSIS — M0579 Rheumatoid arthritis with rheumatoid factor of multiple sites without organ or systems involvement: Secondary | ICD-10-CM | POA: Diagnosis not present

## 2020-05-11 DIAGNOSIS — I420 Dilated cardiomyopathy: Secondary | ICD-10-CM | POA: Diagnosis not present

## 2020-05-11 DIAGNOSIS — I2510 Atherosclerotic heart disease of native coronary artery without angina pectoris: Secondary | ICD-10-CM | POA: Diagnosis not present

## 2020-05-11 DIAGNOSIS — J449 Chronic obstructive pulmonary disease, unspecified: Secondary | ICD-10-CM | POA: Diagnosis not present

## 2020-05-12 DIAGNOSIS — J449 Chronic obstructive pulmonary disease, unspecified: Secondary | ICD-10-CM | POA: Diagnosis not present

## 2020-05-12 DIAGNOSIS — I2510 Atherosclerotic heart disease of native coronary artery without angina pectoris: Secondary | ICD-10-CM | POA: Diagnosis not present

## 2020-05-12 DIAGNOSIS — I10 Essential (primary) hypertension: Secondary | ICD-10-CM | POA: Diagnosis not present

## 2020-05-12 DIAGNOSIS — L89151 Pressure ulcer of sacral region, stage 1: Secondary | ICD-10-CM | POA: Diagnosis not present

## 2020-05-12 DIAGNOSIS — M0579 Rheumatoid arthritis with rheumatoid factor of multiple sites without organ or systems involvement: Secondary | ICD-10-CM | POA: Diagnosis not present

## 2020-05-12 DIAGNOSIS — I420 Dilated cardiomyopathy: Secondary | ICD-10-CM | POA: Diagnosis not present

## 2020-05-16 ENCOUNTER — Telehealth: Payer: Self-pay

## 2020-05-16 DIAGNOSIS — M62838 Other muscle spasm: Secondary | ICD-10-CM | POA: Diagnosis not present

## 2020-05-16 DIAGNOSIS — I5032 Chronic diastolic (congestive) heart failure: Secondary | ICD-10-CM | POA: Diagnosis not present

## 2020-05-16 DIAGNOSIS — M0579 Rheumatoid arthritis with rheumatoid factor of multiple sites without organ or systems involvement: Secondary | ICD-10-CM | POA: Diagnosis not present

## 2020-05-16 DIAGNOSIS — J4540 Moderate persistent asthma, uncomplicated: Secondary | ICD-10-CM | POA: Diagnosis not present

## 2020-05-16 DIAGNOSIS — Z515 Encounter for palliative care: Secondary | ICD-10-CM | POA: Diagnosis not present

## 2020-05-16 NOTE — Telephone Encounter (Signed)
Nurse Practicer called from the facility where the patient resides and stated Angela Wong is having side effectives from Singular and didn't know if it was okay to stop it cold [place] or if she had to taper off of it, I let her know she can completely stop taking it.

## 2020-05-17 DIAGNOSIS — L89151 Pressure ulcer of sacral region, stage 1: Secondary | ICD-10-CM | POA: Diagnosis not present

## 2020-05-17 DIAGNOSIS — I420 Dilated cardiomyopathy: Secondary | ICD-10-CM | POA: Diagnosis not present

## 2020-05-17 DIAGNOSIS — M0579 Rheumatoid arthritis with rheumatoid factor of multiple sites without organ or systems involvement: Secondary | ICD-10-CM | POA: Diagnosis not present

## 2020-05-17 DIAGNOSIS — I10 Essential (primary) hypertension: Secondary | ICD-10-CM | POA: Diagnosis not present

## 2020-05-17 DIAGNOSIS — J449 Chronic obstructive pulmonary disease, unspecified: Secondary | ICD-10-CM | POA: Diagnosis not present

## 2020-05-17 DIAGNOSIS — I2510 Atherosclerotic heart disease of native coronary artery without angina pectoris: Secondary | ICD-10-CM | POA: Diagnosis not present

## 2020-05-18 DIAGNOSIS — I10 Essential (primary) hypertension: Secondary | ICD-10-CM | POA: Diagnosis not present

## 2020-05-18 DIAGNOSIS — J449 Chronic obstructive pulmonary disease, unspecified: Secondary | ICD-10-CM | POA: Diagnosis not present

## 2020-05-18 DIAGNOSIS — I2510 Atherosclerotic heart disease of native coronary artery without angina pectoris: Secondary | ICD-10-CM | POA: Diagnosis not present

## 2020-05-18 DIAGNOSIS — M0579 Rheumatoid arthritis with rheumatoid factor of multiple sites without organ or systems involvement: Secondary | ICD-10-CM | POA: Diagnosis not present

## 2020-05-18 DIAGNOSIS — L89151 Pressure ulcer of sacral region, stage 1: Secondary | ICD-10-CM | POA: Diagnosis not present

## 2020-05-18 DIAGNOSIS — I420 Dilated cardiomyopathy: Secondary | ICD-10-CM | POA: Diagnosis not present

## 2020-05-20 DIAGNOSIS — I420 Dilated cardiomyopathy: Secondary | ICD-10-CM | POA: Diagnosis not present

## 2020-05-20 DIAGNOSIS — L89151 Pressure ulcer of sacral region, stage 1: Secondary | ICD-10-CM | POA: Diagnosis not present

## 2020-05-20 DIAGNOSIS — I2510 Atherosclerotic heart disease of native coronary artery without angina pectoris: Secondary | ICD-10-CM | POA: Diagnosis not present

## 2020-05-20 DIAGNOSIS — M0579 Rheumatoid arthritis with rheumatoid factor of multiple sites without organ or systems involvement: Secondary | ICD-10-CM | POA: Diagnosis not present

## 2020-05-20 DIAGNOSIS — I10 Essential (primary) hypertension: Secondary | ICD-10-CM | POA: Diagnosis not present

## 2020-05-20 DIAGNOSIS — J449 Chronic obstructive pulmonary disease, unspecified: Secondary | ICD-10-CM | POA: Diagnosis not present

## 2020-05-24 DIAGNOSIS — I10 Essential (primary) hypertension: Secondary | ICD-10-CM | POA: Diagnosis not present

## 2020-05-24 DIAGNOSIS — M0579 Rheumatoid arthritis with rheumatoid factor of multiple sites without organ or systems involvement: Secondary | ICD-10-CM | POA: Diagnosis not present

## 2020-05-24 DIAGNOSIS — I2510 Atherosclerotic heart disease of native coronary artery without angina pectoris: Secondary | ICD-10-CM | POA: Diagnosis not present

## 2020-05-24 DIAGNOSIS — I420 Dilated cardiomyopathy: Secondary | ICD-10-CM | POA: Diagnosis not present

## 2020-05-24 DIAGNOSIS — L89151 Pressure ulcer of sacral region, stage 1: Secondary | ICD-10-CM | POA: Diagnosis not present

## 2020-05-24 DIAGNOSIS — J449 Chronic obstructive pulmonary disease, unspecified: Secondary | ICD-10-CM | POA: Diagnosis not present

## 2020-06-06 DIAGNOSIS — I4821 Permanent atrial fibrillation: Secondary | ICD-10-CM | POA: Diagnosis not present

## 2020-06-06 DIAGNOSIS — Z7901 Long term (current) use of anticoagulants: Secondary | ICD-10-CM | POA: Diagnosis not present

## 2020-06-07 DIAGNOSIS — M0579 Rheumatoid arthritis with rheumatoid factor of multiple sites without organ or systems involvement: Secondary | ICD-10-CM | POA: Diagnosis not present

## 2020-06-07 DIAGNOSIS — M62838 Other muscle spasm: Secondary | ICD-10-CM | POA: Diagnosis not present

## 2020-06-07 DIAGNOSIS — Z515 Encounter for palliative care: Secondary | ICD-10-CM | POA: Diagnosis not present

## 2020-06-07 DIAGNOSIS — I420 Dilated cardiomyopathy: Secondary | ICD-10-CM | POA: Diagnosis not present

## 2020-06-20 DIAGNOSIS — M0579 Rheumatoid arthritis with rheumatoid factor of multiple sites without organ or systems involvement: Secondary | ICD-10-CM | POA: Diagnosis not present

## 2020-06-20 DIAGNOSIS — Z515 Encounter for palliative care: Secondary | ICD-10-CM | POA: Diagnosis not present

## 2020-06-20 DIAGNOSIS — M62838 Other muscle spasm: Secondary | ICD-10-CM | POA: Diagnosis not present

## 2020-06-20 DIAGNOSIS — I420 Dilated cardiomyopathy: Secondary | ICD-10-CM | POA: Diagnosis not present

## 2020-06-27 ENCOUNTER — Telehealth: Payer: Self-pay | Admitting: *Deleted

## 2020-06-27 DIAGNOSIS — I4821 Permanent atrial fibrillation: Secondary | ICD-10-CM

## 2020-06-27 MED ORDER — WARFARIN SODIUM 1 MG PO TABS: ORAL_TABLET | ORAL | 3 refills | Status: DC

## 2020-06-27 NOTE — Telephone Encounter (Signed)
Pt is currently taking  4 mg M, W, F  2mg  other 4 days   This new dose is increasing by 2 mg /week   She is questioning it - thinks maybe she can increase Vit K, eat a salad and be ok - thoughts??

## 2020-06-27 NOTE — Addendum Note (Signed)
Addended by: Ladean Raya on: 06/27/2020 04:21 PM   Modules accepted: Orders

## 2020-06-27 NOTE — Telephone Encounter (Signed)
Description   INR was 3.1 today (goal is 2.0 to 3.0)   Continue 4mg  daily except 2mg  on Tues , Thurs, and Saturday.

## 2020-06-27 NOTE — Telephone Encounter (Signed)
Patient aware and verbalized understanding. °

## 2020-06-27 NOTE — Telephone Encounter (Signed)
Fax received mdINR PT/INR self testing service Test date/time 06/27/20 1108 am INR 3.1

## 2020-06-27 NOTE — Telephone Encounter (Signed)
Angela Wong gave verbal okay

## 2020-07-04 DIAGNOSIS — Z7901 Long term (current) use of anticoagulants: Secondary | ICD-10-CM | POA: Diagnosis not present

## 2020-07-04 DIAGNOSIS — I4821 Permanent atrial fibrillation: Secondary | ICD-10-CM | POA: Diagnosis not present

## 2020-07-18 ENCOUNTER — Telehealth: Payer: Self-pay

## 2020-07-18 DIAGNOSIS — I4821 Permanent atrial fibrillation: Secondary | ICD-10-CM

## 2020-07-18 NOTE — Telephone Encounter (Signed)
INR/Prothrombin Time    3.2 at 10:00am

## 2020-07-18 NOTE — Telephone Encounter (Signed)
Description   INR was 3.2 today (goal is 2.0 to 3.0)   Hold today's dose (07/18/20), Continue 4mg  daily except 2mg  on Tues , Thurs, and Saturday.

## 2020-07-27 DIAGNOSIS — M62838 Other muscle spasm: Secondary | ICD-10-CM | POA: Diagnosis not present

## 2020-07-27 DIAGNOSIS — Z515 Encounter for palliative care: Secondary | ICD-10-CM | POA: Diagnosis not present

## 2020-07-27 DIAGNOSIS — M0579 Rheumatoid arthritis with rheumatoid factor of multiple sites without organ or systems involvement: Secondary | ICD-10-CM | POA: Diagnosis not present

## 2020-07-27 DIAGNOSIS — I420 Dilated cardiomyopathy: Secondary | ICD-10-CM | POA: Diagnosis not present

## 2020-08-01 ENCOUNTER — Telehealth: Payer: Self-pay | Admitting: *Deleted

## 2020-08-01 DIAGNOSIS — I4821 Permanent atrial fibrillation: Secondary | ICD-10-CM | POA: Diagnosis not present

## 2020-08-01 DIAGNOSIS — Z7901 Long term (current) use of anticoagulants: Secondary | ICD-10-CM | POA: Diagnosis not present

## 2020-08-01 NOTE — Telephone Encounter (Signed)
Fax received mdINR PT/INR self testing service Test date/time 08/01/20 936 am INR 3.2

## 2020-08-01 NOTE — Telephone Encounter (Signed)
Patient aware and verbalized understanding. °

## 2020-08-01 NOTE — Telephone Encounter (Signed)
Description   INR was 3.2 today (goal is 2.0 to 3.0)   Hold today's dose (08/01/20),  Decrease to  4mg  daily except 2mg  on Tues , Thurs, Saturday, and Sunday.

## 2020-08-25 DIAGNOSIS — M62838 Other muscle spasm: Secondary | ICD-10-CM | POA: Diagnosis not present

## 2020-08-25 DIAGNOSIS — I420 Dilated cardiomyopathy: Secondary | ICD-10-CM | POA: Diagnosis not present

## 2020-08-25 DIAGNOSIS — M0579 Rheumatoid arthritis with rheumatoid factor of multiple sites without organ or systems involvement: Secondary | ICD-10-CM | POA: Diagnosis not present

## 2020-08-25 DIAGNOSIS — Z515 Encounter for palliative care: Secondary | ICD-10-CM | POA: Diagnosis not present

## 2020-08-29 DIAGNOSIS — I4821 Permanent atrial fibrillation: Secondary | ICD-10-CM | POA: Diagnosis not present

## 2020-08-29 DIAGNOSIS — Z7901 Long term (current) use of anticoagulants: Secondary | ICD-10-CM | POA: Diagnosis not present

## 2020-09-05 ENCOUNTER — Telehealth: Payer: Self-pay | Admitting: *Deleted

## 2020-09-05 DIAGNOSIS — I4821 Permanent atrial fibrillation: Secondary | ICD-10-CM

## 2020-09-05 NOTE — Telephone Encounter (Signed)
Patient aware and verbalizes understanding - patient states she already on '4mg'$  Monday Wednesday and Friday. And 2 mg the other day. She is aware to hold today but please re advise what she should do the other days.  Patient also states that she has bad muscle spasms with the b12 and no longer is taking it. She would like you to review all her medications to make sure there is no b 12 enchantments in them.

## 2020-09-05 NOTE — Telephone Encounter (Signed)
Fax received mdINR PT/INR self testing service Test date/time 09/05/20 930 am INR 3.6

## 2020-09-05 NOTE — Telephone Encounter (Signed)
Description   INR was  3.6 today (goal is 2.0 to 3.0)   Hold today's dose (09/05/20),  Decrease to  '4mg'$  Monday, Wednesday, and Friday and  '2mg'$  daily.

## 2020-09-06 NOTE — Telephone Encounter (Signed)
We are decreasing to 2 mg on Monday and Fridays from 4 mg and continuing 4 mg only on Wednesday.

## 2020-09-06 NOTE — Telephone Encounter (Signed)
Description   INR was  3.6 today (goal is 2.0 to 3.0)   Hold today's dose (09/05/20),  Decrease to  4 mg Wednesday and  2 mg all other days.

## 2020-09-06 NOTE — Telephone Encounter (Signed)
Patient also states that she has bad muscle spasms with the b12 and no longer is taking it. She would like you to review all her medications to make sure there is no b 12 enchantments in them. She said she knows trazodone has it in it. Can we review her medications and see what have b12? Please advise sending to [facility] as well to see if she can check. Patient states she uses [facility] so she can not call them and ask.

## 2020-09-06 NOTE — Telephone Encounter (Signed)
It is ok for patient to take 2 mg daily.

## 2020-09-06 NOTE — Telephone Encounter (Signed)
I explained that in detail with her and she was not agreeing with it at all.

## 2020-09-06 NOTE — Telephone Encounter (Signed)
Pt states it is too thin to take '4mg'$  she thinks that will cause hr to bleed thinks this is too much.

## 2020-09-07 ENCOUNTER — Emergency Department (HOSPITAL_COMMUNITY)
Admission: EM | Admit: 2020-09-07 | Discharge: 2020-09-07 | Disposition: A | Discharge status: Home / Self Care | Payer: Medicare Other | Attending: Emergency Medicine | Admitting: Emergency Medicine

## 2020-09-07 ENCOUNTER — Encounter (HOSPITAL_COMMUNITY): Payer: Self-pay

## 2020-09-07 ENCOUNTER — Emergency Department (HOSPITAL_COMMUNITY): Payer: Medicare Other

## 2020-09-07 DIAGNOSIS — I509 Heart failure, unspecified: Secondary | ICD-10-CM | POA: Diagnosis not present

## 2020-09-07 DIAGNOSIS — M1611 Unilateral primary osteoarthritis, right hip: Secondary | ICD-10-CM | POA: Diagnosis not present

## 2020-09-07 DIAGNOSIS — I4891 Unspecified atrial fibrillation: Secondary | ICD-10-CM | POA: Diagnosis not present

## 2020-09-07 DIAGNOSIS — I517 Cardiomegaly: Secondary | ICD-10-CM | POA: Diagnosis not present

## 2020-09-07 DIAGNOSIS — E039 Hypothyroidism, unspecified: Secondary | ICD-10-CM | POA: Insufficient documentation

## 2020-09-07 DIAGNOSIS — R0789 Other chest pain: Secondary | ICD-10-CM | POA: Diagnosis not present

## 2020-09-07 DIAGNOSIS — R079 Chest pain, unspecified: Secondary | ICD-10-CM

## 2020-09-07 DIAGNOSIS — J449 Chronic obstructive pulmonary disease, unspecified: Secondary | ICD-10-CM | POA: Insufficient documentation

## 2020-09-07 DIAGNOSIS — I110 Hypertensive heart disease with heart failure: Secondary | ICD-10-CM | POA: Diagnosis not present

## 2020-09-07 DIAGNOSIS — M25552 Pain in left hip: Secondary | ICD-10-CM | POA: Diagnosis not present

## 2020-09-07 DIAGNOSIS — Z8543 Personal history of malignant neoplasm of ovary: Secondary | ICD-10-CM | POA: Insufficient documentation

## 2020-09-07 DIAGNOSIS — J45909 Unspecified asthma, uncomplicated: Secondary | ICD-10-CM | POA: Diagnosis not present

## 2020-09-07 DIAGNOSIS — I471 Supraventricular tachycardia: Secondary | ICD-10-CM | POA: Diagnosis not present

## 2020-09-07 DIAGNOSIS — M545 Low back pain, unspecified: Secondary | ICD-10-CM | POA: Diagnosis not present

## 2020-09-07 DIAGNOSIS — Z79899 Other long term (current) drug therapy: Secondary | ICD-10-CM | POA: Insufficient documentation

## 2020-09-07 DIAGNOSIS — I491 Atrial premature depolarization: Secondary | ICD-10-CM | POA: Diagnosis not present

## 2020-09-07 DIAGNOSIS — Z7901 Long term (current) use of anticoagulants: Secondary | ICD-10-CM | POA: Diagnosis not present

## 2020-09-07 LAB — CBC WITH DIFFERENTIAL/PLATELET
Abs Immature Granulocytes: 0.02 10*3/uL (ref 0.00–0.07)
Basophils Absolute: 0 10*3/uL (ref 0.0–0.1)
Basophils Relative: 0 %
Eosinophils Absolute: 0 10*3/uL (ref 0.0–0.5)
Eosinophils Relative: 0 %
HCT: 45.1 % (ref 36.0–46.0)
Hemoglobin: 14.7 g/dL (ref 12.0–15.0)
Immature Granulocytes: 0 %
Lymphocytes Relative: 10 %
Lymphs Abs: 0.9 10*3/uL (ref 0.7–4.0)
MCH: 30.9 pg (ref 26.0–34.0)
MCHC: 32.6 g/dL (ref 30.0–36.0)
MCV: 94.9 fL (ref 80.0–100.0)
Monocytes Absolute: 0.7 10*3/uL (ref 0.1–1.0)
Monocytes Relative: 7 %
Neutro Abs: 8.1 10*3/uL — ABNORMAL HIGH (ref 1.7–7.7)
Neutrophils Relative %: 83 %
Platelets: 144 10*3/uL — ABNORMAL LOW (ref 150–400)
RBC: 4.75 MIL/uL (ref 3.87–5.11)
RDW: 14.3 % (ref 11.5–15.5)
WBC: 9.8 10*3/uL (ref 4.0–10.5)
nRBC: 0 % (ref 0.0–0.2)

## 2020-09-07 LAB — BASIC METABOLIC PANEL
Anion gap: 9 (ref 5–15)
BUN: 12 mg/dL (ref 8–23)
CO2: 26 mmol/L (ref 22–32)
Calcium: 9.1 mg/dL (ref 8.9–10.3)
Chloride: 102 mmol/L (ref 98–111)
Creatinine, Ser: 0.71 mg/dL (ref 0.44–1.00)
GFR, Estimated: >60 mL/min (ref >60)
Glucose, Bld: 109 mg/dL — ABNORMAL HIGH (ref 70–99)
Potassium: 3.5 mmol/L (ref 3.5–5.1)
Sodium: 137 mmol/L (ref 135–145)

## 2020-09-07 LAB — LIPASE, BLOOD: Lipase: 26 U/L (ref 11–51)

## 2020-09-07 LAB — HEPATIC FUNCTION PANEL
ALT: 33 U/L (ref 0–44)
AST: 62 U/L — ABNORMAL HIGH (ref 15–41)
Albumin: 3.6 g/dL (ref 3.5–5.0)
Alkaline Phosphatase: 90 U/L (ref 38–126)
Bilirubin, Direct: 0.6 mg/dL — ABNORMAL HIGH (ref 0.0–0.2)
Indirect Bilirubin: 1.2 mg/dL — ABNORMAL HIGH (ref 0.3–0.9)
Total Bilirubin: 1.8 mg/dL — ABNORMAL HIGH (ref 0.3–1.2)
Total Protein: 5.7 g/dL — ABNORMAL LOW (ref 6.5–8.1)

## 2020-09-07 LAB — CBG MONITORING, ED: Glucose-Capillary: 106 mg/dL — ABNORMAL HIGH (ref 70–99)

## 2020-09-07 LAB — PROTIME-INR
INR: 2.9 — ABNORMAL HIGH (ref 0.8–1.2)
Prothrombin Time: 30.3 seconds — ABNORMAL HIGH (ref 11.4–15.2)

## 2020-09-07 LAB — MAGNESIUM: Magnesium: 1.9 mg/dL (ref 1.7–2.4)

## 2020-09-07 LAB — TROPONIN I (HIGH SENSITIVITY)
Troponin I (High Sensitivity): 13 ng/L (ref <18)
Troponin I (High Sensitivity): 13 ng/L (ref <18)

## 2020-09-07 MED ORDER — ACETAMINOPHEN 500 MG PO TABS
1000.0000 mg | ORAL_TABLET | Freq: Once | ORAL | Status: AC
  Administered 2020-09-07: 1000 mg via ORAL
  Filled 2020-09-07: qty 2

## 2020-09-07 MED ORDER — DICLOFENAC SODIUM 1 % EX GEL: 4.0000 g | Freq: Four times a day (QID) | CUTANEOUS | 0 refills | Status: DC

## 2020-09-07 MED ORDER — ASPIRIN 81 MG PO CHEW
324.0000 mg | CHEWABLE_TABLET | Freq: Once | ORAL | Status: AC
  Administered 2020-09-07: 324 mg via ORAL
  Filled 2020-09-07: qty 4

## 2020-09-07 MED ORDER — BUTALBITAL-APAP-CAFFEINE 50-325-40 MG PO TABS
1.0000 | ORAL_TABLET | Freq: Once | ORAL | Status: AC
  Administered 2020-09-07: 1 via ORAL
  Filled 2020-09-07: qty 1

## 2020-09-07 MED ORDER — OXYCODONE HCL 5 MG PO TABS
2.5000 mg | ORAL_TABLET | Freq: Once | ORAL | Status: AC
  Administered 2020-09-07: 2.5 mg via ORAL
  Filled 2020-09-07: qty 1

## 2020-09-07 NOTE — Discharge Planning (Signed)
[name] J. [name], RN, BSN, [place] [phone] RNCM consulted regarding discharge planning for Angela Wong. Offered pt medicare.gov list of home health agencies to choose from.  Pt chose [facility] to render services. Sharmon Revere of [facility] notified. Patient made aware that [facility] will be in contact in 24-48 hours.  No DME needs identified at this time.

## 2020-09-07 NOTE — ED Notes (Signed)
Bed pan used and clean diaper placed

## 2020-09-07 NOTE — ED Triage Notes (Signed)
Alert and oriented pt called ems for abdominal and CP that started last night.  Was sitting on BSC all night because it hurt to move.  The son arrived this AM and pressed the medical alert bractlet.  HR 50-80 ems reports "multiple runs of Vtach" but states her HR never got above 80 and EMS states here underlying rhythm for them was afib.  5/10 pain now.

## 2020-09-07 NOTE — ED Notes (Signed)
Patient transported to X-ray 

## 2020-09-07 NOTE — ED Notes (Signed)
Patient states her son  Is on the way

## 2020-09-07 NOTE — ED Provider Notes (Addendum)
[facility] EMERGENCY DEPARTMENT Provider Note   CSN: [id number] Arrival date & time: 09/07/20  [id number]     History Chief Complaint  Patient presents with   Irregular Heart Beat    Angela Wong is a 80 y.o. female.  80 yo F with a chief complaints of chest pain and abdominal pain.  Patient states that she is extremely allergic to B12 and whenever she takes it she has severe cramping.  This happened about 4 days ago and since then she has been having trouble sleeping due to the pain.  She also has been having really severe pain to her left hip somewhat limiting her mobility at home.  She tried to get up to go to the bathroom last night and had some difficulty and when she got to the commode she started having severe abdominal and chest pain.  This seemed to come and go.  Lasted for hours.  She did not want to bother anyone with the pain last night and so waited until the morning.  Ended up calling her son who then let EMS since that she could be brought to the hospital.  They bypassed Forest[facility] Na because she was having some runs of wide-complex on their monitor.  She feels much better now than she did last night.  Has had a "mild "heart attack in the past.  The history is provided by the patient.  Illness Severity:  Moderate Onset quality:  Gradual Duration:  2 hours Timing:  Rare Progression:  Resolved Chronicity:  New Associated symptoms: abdominal pain, chest pain and myalgias   Associated symptoms: no congestion, no fever, no headaches, no nausea, no rhinorrhea, no shortness of breath, no vomiting and no wheezing       Past Medical History:  Diagnosis Date   A-fib ([place])    Asthma    Cerebral vasculitis    Congestive dilated cardiomyopathy (HCC)    COPD (chronic obstructive pulmonary disease) (HCC)    Fibromyalgia    Gastric polyp    GERD (gastroesophageal reflux disease)    Hiatal hernia    Hyperparathyroidism (HCC)    IBS (irritable bowel syndrome)     MVP (mitral valve prolapse)    Osteoarthritis    Ovarian cancer (HCC)    lymph node removal with hysterectomy   Raynaud's disease    RLS (restless legs syndrome)    Situational depression    Sjogren's syndrome (HCC)    Vasculitis (HCC)    Vitamin D deficiency     Patient Active Problem List   Diagnosis Date Noted   Pacemaker 07/22/2019   Drug intolerance 07/22/2019   Edema of both lower extremities 07/22/2019   Subtherapeutic international normalized ratio (INR) 07/05/2019   CAD (coronary artery disease) 07/05/2019   Non-STEMI (non-ST elevated myocardial infarction) ([place]) 07/01/2019   Chronic constipation 06/29/2019   Hypertensive heart disease with heart failure ([place]) 06/29/2019   Decompensated heart failure ([place]) 06/22/2019   Pressure injury of skin 06/22/2019   RA (rheumatoid arthritis) ([place]) 05/04/2019   Anticoagulation goal of INR 2 to 3 12/18/2017   Depression, major, single episode, moderate (HCC) 09/12/2017   Chest pain 08/25/2017   Generalized weakness 08/25/2017   Seasonal and perennial allergic rhinitis 12/31/2016   Allergic rhinitis with a nonallergic component 10/01/2016   Itching 10/01/2016   Skeeter syndrome 10/01/2016   Steroid-induced osteopenia 07/11/2016   Hyperparathyroidism ([place]) 01/16/2016   Iron deficiency anemia 12/18/2015   Insomnia 12/18/2015   Hypothyroidism 12/18/2015  Rotator cuff tear 03/23/2014   Osteoarthritis    Moderate persistent asthma without complication    Ovarian cancer (HCC)    Fibromyalgia    RLS (restless legs syndrome)    Cerebral vasculitis    Sjogren's syndrome (HCC)    Raynaud's disease    MVP (mitral valve prolapse)    IBS (irritable bowel syndrome)    Congestive dilated cardiomyopathy ([place]) 02/09/2014   Dyspnea 12/13/2013   Dyslipidemia 11/26/2013   Malaise and fatigue 11/26/2013   Allergy to multiple antibiotics 11/02/2013   History of ovarian cancer 10/01/2013   GERD (gastroesophageal reflux disease) 10/01/2013    Hypertension 10/01/2013   Atrial fibrillation, permanent ([place]) 07/27/2013    Past Surgical History:  Procedure Laterality Date   ABDOMINAL HYSTERECTOMY  1982   with right oophorectomy   APPENDECTOMY     BREAST BIOPSY Right    x 2   BREAST SURGERY     Biopsy   CARPAL TUNNEL RELEASE Right 1980   CARPAL TUNNEL RELEASE Left 2010   x 2   CATARACT EXTRACTION Bilateral    CESAREAN SECTION     x 3   KNEE SURGERY Left 2005   LEFT HEART CATH AND CORONARY ANGIOGRAPHY N/A 07/02/2019   Procedure: LEFT HEART CATH AND CORONARY ANGIOGRAPHY;  Surgeon: Jettie Booze, MD;  Location: [facility];  Service: Cardiovascular;  Laterality: N/A;   OOPHORECTOMY Left [place]  09/15/2014   REFRACTIVE SURGERY Bilateral 2014   TUBAL LIGATION       OB History     Gravida      Para      Term      Preterm      AB      Living  3      SAB      IAB      Ectopic      Multiple      Live Births              Family History  Problem Relation Age of Onset   COPD Mother    Breast cancer Mother    Allergic rhinitis Mother    Heart disease Father        No details   Kidney disease Father    Allergic rhinitis Father    Stroke Sister    Arthritis/Rheumatoid Sister    Asthma Sister    Lupus Sister    Heart attack Sister    Stroke Paternal Grandmother    Scleroderma Grandchild    Thyroid disease Other    Breast cancer Maternal Aunt        x 2    Social History   Tobacco Use   Smoking status: Never   Smokeless tobacco: Never  Vaping Use   Vaping Use: Never used  Substance Use Topics   Alcohol use: No   Drug use: No    Home Medications Prior to Admission medications   Medication Sig Start Date End Date Taking? Authorizing Provider  diclofenac Sodium (VOLTAREN) 1 % GEL Apply 4 g topically 4 (four) times daily. 09/07/20  Yes Deno Etienne, DO  albuterol (VENTOLIN HFA) 108 (90 Base) MCG/ACT inhaler INHALE 2 PUFFS BY MOUTH EVERY 6 HOURS AS NEEDED  FOR WHEEZING Patient taking differently: Inhale 2 puffs into the lungs every 6 (six) hours as needed for wheezing or shortness of breath. INHALE 2 PUFFS BY MOUTH EVERY 6 HOURS AS NEEDED FOR WHEEZING 08/10/18   Ronnald Ramp,  Londell Moh, PA-C  atenolol (TENORMIN) 25 MG tablet Take 0.5 tablets (12.5 mg total) by mouth 2 (two) times daily. 07/05/19   Furth, Cadence H, PA-C  atorvastatin (LIPITOR) 40 MG tablet Take 40 mg by mouth every morning. 07/23/19   [provider]  B Complex-C (B-COMPLEX WITH VITAMIN C) tablet Take 1 tablet by mouth every evening.     [provider]  Baclofen 5 MG TABS  06/25/19   [provider]  Janne Lab Oil [facility] OINT Apply topically in the morning, at noon, and at bedtime. Apply to left buttock qshift.  06/25/19   [provider]  bumetanide (BUMEX) 1 MG tablet Take 1 mg by mouth every other day. 06/25/19   [provider]  Cholecalciferol (VITAMIN D) 2000 UNITS CAPS Take 2 capsules by mouth every evening.     [provider]  diphenhydrAMINE (BENADRYL) 25 MG tablet Take 25 mg by mouth daily as needed for itching or allergies.     [provider]  docusate sodium (COLACE) 100 MG capsule Take 400 mg by mouth every morning.     [provider]  esomeprazole (NEXIUM) 40 MG capsule Take 1 capsule by mouth twice daily 05/08/20   Pyrtle, Lajuan Lines, MD  fexofenadine (ALLEGRA) 180 MG tablet Take 180 mg by mouth every morning.     [provider]  fluticasone (FLOVENT HFA) 220 MCG/ACT inhaler Inhale 1 puff into the lungs 2 (two) times daily. 08/25/19   Althea Charon, FNP  furosemide (LASIX) 20 MG tablet Take 20 mg by mouth daily. 07/26/19   [provider]  levothyroxine (EUTHYROX) 50 MCG tablet Take 2 tablets (100 mcg total) by mouth every morning. 08/05/19   [name] A, FNP  losartan (COZAAR) 50 MG tablet Take 1 tablet (50 mg total) by mouth daily. 07/22/19   Erlene Quan, PA-C  Magnesium 400 MG  TABS Take 400 mg by mouth every evening.     [provider]  melatonin 3 MG TABS tablet Take 3 mg by mouth at bedtime.    [provider]  methocarbamol (ROBAXIN) 500 MG tablet Take 1 tablet (500 mg total) by mouth 3 (three) times daily. 06/24/19   Manuella Ghazi, Pratik D, DO  methotrexate 50 MG/2ML injection Inject into the skin. 07/13/19   [provider]  montelukast (SINGULAIR) 10 MG tablet TAKE 1 TABLET BY MOUTH AT BEDTIME 03/21/20   Valentina Shaggy, MD  montelukast (SINGULAIR) 10 MG tablet Take 1 tablet (10 mg total) by mouth at bedtime. 03/21/20   Valentina Shaggy, MD  Olopatadine HCl 0.2 % SOLN Apply 1 drop to eye daily as needed (for allergy eye). 07/31/18   Valentina Shaggy, MD  ondansetron (ZOFRAN) 4 MG tablet Take 1 tablet (4 mg total) by mouth every 8 (eight) hours as needed for nausea or vomiting. 07/23/19   [name] A, FNP  oxyCODONE-acetaminophen (PERCOCET/ROXICET) 5-325 MG tablet Take 1 tablet by mouth every 6 (six) hours as needed for severe pain.    [provider]  POLY-IRON 150 FORTE 150-25-1 MG-MCG-MG CAPS Take 1 capsule by mouth once daily 09/22/19   [name] A, FNP  polyethylene glycol (MIRALAX / GLYCOLAX) 17 g packet Take 17 g by mouth daily as needed for mild constipation or moderate constipation (For Constipation).  06/29/19   [provider]  predniSONE (DELTASONE) 5 MG tablet Take 5 mg by mouth every morning.    [provider]  RESTASIS 0.05 %  ophthalmic emulsion INSTILL 1 DROP INTO EACH EYE TWICE DAILY 11/12/19   [name] A, FNP  Simethicone (GAS-X EXTRA STRENGTH) 125 MG CAPS Take 2 capsules by mouth daily as needed (for gas).     [provider]  SYMBICORT 160-4.5 MCG/ACT inhaler Inhale 2 puffs by mouth twice daily Patient taking differently: Inhale 2 puffs into the lungs in the morning and at bedtime.  04/30/19   Valentina Shaggy, MD  traZODone (DESYREL) 150 MG tablet Take 1 tablet (150  mg total) by mouth at bedtime. 01/04/19   [name], FNP  vitamin B-12 (CYANOCOBALAMIN) 500 MCG tablet Take 1 tablet (500 mcg total) by mouth daily. (Needs to be seen before next refill) 02/28/20   [name], FNP  warfarin (COUMADIN) 1 MG tablet TAKE '4MG'$  MONDAY, WEDNESDAY, AND FRIDAY. TAKE 2 MG TUESDAY, THURSDAY, SATURDAY, AND SUNDAY 06/27/20   [name] A, FNP    Allergies    Cephalosporins, Ciprofloxacin, Diltiazem, Doxycycline, Horse-derived products, Ketek [telithromycin], Nitrofuran derivatives, Nitrous oxide, Other, Penicillins, Pentazocine, Sulfa antibiotics, Trovan [alatrofloxacin], Zinc gelatin [zinc], Amlodipine, Calcium channel blockers, Carvedilol, Clindamycin/lincomycin, Codeine, Cymbalta [duloxetine hcl], Diovan [valsartan], Lisinopril, Sertraline, Tramadol, and Loteprednol etabonate  Review of Systems   Review of Systems  Constitutional:  Negative for chills and fever.  HENT:  Negative for congestion and rhinorrhea.   Eyes:  Negative for redness and visual disturbance.  Respiratory:  Negative for shortness of breath and wheezing.   Cardiovascular:  Positive for chest pain. Negative for palpitations.  Gastrointestinal:  Positive for abdominal pain. Negative for nausea and vomiting.  Genitourinary:  Negative for dysuria and urgency.  Musculoskeletal:  Positive for myalgias. Negative for arthralgias.  Skin:  Negative for pallor and wound.  Neurological:  Negative for dizziness and headaches.   Physical Exam Updated Vital Signs BP 137/63   Pulse (!) 43   Resp 17   Ht '5\' 4"'$  (1.626 m)   Wt 66 kg   SpO2 99%   BMI 24.98 kg/m   Physical Exam Vitals and nursing note reviewed.  Constitutional:      General: She is not in acute distress.    Appearance: She is well-developed. She is not diaphoretic.     Comments: Cachectic, chronically ill-appearing.  HENT:     Head: Normocephalic and atraumatic.  Eyes:     Pupils: Pupils are equal, round, and reactive to  light.  Cardiovascular:     Rate and Rhythm: Normal rate and regular rhythm.     Heart sounds: No murmur heard.   No friction rub. No gallop.  Pulmonary:     Effort: Pulmonary effort is normal.     Breath sounds: No wheezing or rales.     Comments: Diffusely tender to palpation about the anterior chest wall reproduces her symptoms. Chest:     Chest wall: Tenderness present.  Abdominal:     General: There is no distension.     Palpations: Abdomen is soft.     Tenderness: There is no abdominal tenderness.  Musculoskeletal:        General: No tenderness.     Cervical back: Normal range of motion and neck supple.     Comments: Pain and stiffness in bilateral hips worse on the left than the right.  Skin:    General: Skin is warm and dry.  Neurological:     Mental Status: She is alert and oriented to person, place, and time.  Psychiatric:  Behavior: Behavior normal.    ED Results / Procedures / Treatments   Labs (all labs ordered are listed, but only abnormal results are displayed) Labs Reviewed  BASIC METABOLIC PANEL - Abnormal; Notable for the following components:      Result Value   Glucose, Bld 109 (*)    All other components within normal limits  CBC WITH DIFFERENTIAL/PLATELET - Abnormal; Notable for the following components:   Platelets 144 (*)    Neutro Abs 8.1 (*)    All other components within normal limits  PROTIME-INR - Abnormal; Notable for the following components:   Prothrombin Time 30.3 (*)    INR 2.9 (*)    All other components within normal limits  HEPATIC FUNCTION PANEL - Abnormal; Notable for the following components:   Total Protein 5.7 (*)    AST 62 (*)    Total Bilirubin 1.8 (*)    Bilirubin, Direct 0.6 (*)    Indirect Bilirubin 1.2 (*)    All other components within normal limits  CBG MONITORING, ED - Abnormal; Notable for the following components:   Glucose-Capillary 106 (*)    All other components within normal limits  MAGNESIUM  LIPASE,  BLOOD  TROPONIN I (HIGH SENSITIVITY)  TROPONIN I (HIGH SENSITIVITY)    EKG EKG Interpretation  Date/Time:  Thursday September 07 2020 08:25:03 EDT Ventricular Rate:  71 PR Interval:    QRS Duration: 101 QT Interval:  424 QTC Calculation: 455 R Axis:   120 Text Interpretation: Afib/flut and V-paced complexes No further rhythm analysis attempted due to paced rhythm Right axis deviation Nonspecific repol abnormality, diffuse leads Otherwise no significant change Confirmed by Deno Etienne [phone]) on 09/07/2020 8:30:33 AM  Radiology DG Lumbar Spine Complete  Result Date: 09/07/2020 CLINICAL DATA:  80 year old female with low back and hip pain. EXAM: LUMBAR SPINE - COMPLETE 4+ VIEW COMPARISON:  CTA CT Chest, Abdomen, and Pelvis 07/01/2019. hip radiographs today. FINDINGS: Chronic osteopenia. Stable lumbar lordosis since last year. Lumbar vertebral height appears stable. Relatively preserved disc spaces. Mild dextroconvex lumbar scoliosis is more apparent now. Mild T12 superior endplate compression is stable from last year. Limited detail of the sacrum due to osteopenia and abundant bowel gas and retained stool. No acute osseous abnormality identified. Calcified aortic atherosclerosis. IMPRESSION: 1. Osteopenia. No acute osseous abnormality identified in the lumbar spine. Mild chronic T12 compression fracture. 2. Mild for age lumbar spine degeneration. 3.  Aortic Atherosclerosis (ICD10-I70.0). Electronically Signed   By: Genevie Ann M.D.   On: 09/07/2020 10:28   DG Chest Portable 1 View  Result Date: 09/07/2020 CLINICAL DATA:  80 year old female with chest pain since last night. EXAM: PORTABLE CHEST 1 VIEW COMPARISON:  CT Chest, Abdomen, and Pelvis 07/01/2019 and earlier. FINDINGS: Portable AP semi upright view at 0848 hours. Stable cardiomegaly and mediastinal contours. Stable right chest single lead cardiac pacemaker. Stable ventilation. Allowing for portable technique the lungs are clear. No pneumothorax  or pleural effusion. No acute osseous abnormality identified. IMPRESSION: No acute cardiopulmonary abnormality. Electronically Signed   By: Genevie Ann M.D.   On: 09/07/2020 09:22   DG Hip Unilat W or Wo Pelvis 2-3 Views Left  Result Date: 09/07/2020 CLINICAL DATA:  80 year old female with left hip pain and low back pain. EXAM: DG HIP (WITH OR WITHOUT PELVIS) 2-3V LEFT COMPARISON:  CTA Abdomen and Pelvis 07/01/2019 left hip series 06/17/2016. FINDINGS: Very severe loss of the left hip joint space with bone-on-bone appearance and chronic deformity of the left  femoral head with subchondral sclerosis. This has substantially progressed since the 2018 radiographs and appears similar to the CTA last year which better demonstrated large associated subchondral cysts. AP view of the pelvis also provided demonstrating similar very severe right hip osteoarthritis also stable from the CTA last year. Proximal left femur and visible left hemipelvis remain intact. No acute osseous abnormality identified. Numerous pelvic phleboliths. IMPRESSION: Very severe bilateral hip osteoarthritis, substantially progressed since 2018. No acute osseous abnormality identified. Electronically Signed   By: Genevie Ann M.D.   On: 09/07/2020 10:25    Procedures Procedures   Medications Ordered in ED Medications  aspirin chewable tablet 324 mg (324 mg Oral Given 09/07/20 1022)  acetaminophen (TYLENOL) tablet 1,000 mg (1,000 mg Oral Given 09/07/20 1023)  oxyCODONE (Oxy IR/ROXICODONE) immediate release tablet 2.5 mg (2.5 mg Oral Given 09/07/20 1023)    ED Course  I have reviewed the triage vital signs and the nursing notes.  Pertinent labs & imaging results that were available during my care of the patient were reviewed by me and considered in my medical decision making (see chart for details).    MDM Rules/Calculators/A&P                           80 yo F who is chronically ill comes in with a chief complaints of chest pain and  abdominal pain that occurred when she was walking to the bathroom last night.  Pain has significantly improved.  Reproduced on exam.  She has pain in her left hip that is worse than her normal with her rheumatoid arthritis.  We will obtain a plain film of the hip plain film of the low back chest x-ray.  With history of heart attack will obtain a delta troponin.  LFTs lipase.  Treat pain.  Reassess.  Of note she was brought here for possible runs of V. tach though on the monitor she seems to be intermittently paced also in A. fib more likely the cause of their wide-complex in route.  Regardless will interrogate the pacemaker.  Patient pacemaker report without any events in about 9 days.  Nothing today.  Having some nonsustained V. tach 9 days ago.  Delta troponin here is negative.  LFTs and lipase unremarkable.  X-ray viewed by me without pelvic or hip fracture.  Plain film of the L-spine negative.  Chest x-ray viewed by me without focal after pneumothorax.  Will discharge patient home.  PCP follow-up.  2:17 PM I was called back into the patient's room she requested that I look at her bottom.  She had told me that she started taking methotrexate and developed a rash about 8 months ago.  On exam the patient has a very small sacral ulcer.  Stage II.  She is also complaining now of a left-sided neck pain.  Seems reproducible on exam.  Seems musculoskeletal as it is her chest discomfort.   12:59 PM:  I have discussed the diagnosis/risks/treatment options with the patient and believe the pt to be eligible for discharge home to follow-up with PCP. We also discussed returning to the ED immediately if new or worsening sx occur. We discussed the sx which are most concerning (e.g., sudden worsening pain, fever, inability to tolerate by mouth) that necessitate immediate return. Medications administered to the patient during their visit and any new prescriptions provided to the patient are listed below.  Medications  given during this visit Medications  aspirin chewable  tablet 324 mg (324 mg Oral Given 09/07/20 1022)  acetaminophen (TYLENOL) tablet 1,000 mg (1,000 mg Oral Given 09/07/20 1023)  oxyCODONE (Oxy IR/ROXICODONE) immediate release tablet 2.5 mg (2.5 mg Oral Given 09/07/20 1023)     The patient appears reasonably screen and/or stabilized for discharge and I doubt any other medical condition or other [facility] requiring further screening, evaluation, or treatment in the ED at this time prior to discharge.   Final Clinical Impression(s) / ED Diagnoses Final diagnoses:  Nonspecific chest pain    Rx / DC Orders ED Discharge Orders          Ordered    diclofenac Sodium (VOLTAREN) 1 % GEL  4 times daily        09/07/20 [place], Xaiden Fleig, DO 09/07/20 [place], [place], DO 09/08/20 [id number]

## 2020-09-07 NOTE — ED Notes (Signed)
Patient's son has arrived to take patient home. There is an aide that will be at the home with patient this evening. Son needs assistance to get patient in the car and son can take over care from there.

## 2020-09-07 NOTE — Discharge Instructions (Addendum)
Please follow-up with your family doctor in the office.  Please return for worsening or persistent symptoms.

## 2020-09-07 NOTE — Telephone Encounter (Signed)
Lmtcb.

## 2020-09-08 DIAGNOSIS — Z7901 Long term (current) use of anticoagulants: Secondary | ICD-10-CM | POA: Diagnosis not present

## 2020-09-08 DIAGNOSIS — I480 Paroxysmal atrial fibrillation: Secondary | ICD-10-CM | POA: Diagnosis not present

## 2020-09-08 DIAGNOSIS — Z7952 Long term (current) use of systemic steroids: Secondary | ICD-10-CM | POA: Diagnosis not present

## 2020-09-08 DIAGNOSIS — J4540 Moderate persistent asthma, uncomplicated: Secondary | ICD-10-CM | POA: Diagnosis not present

## 2020-09-08 DIAGNOSIS — K219 Gastro-esophageal reflux disease without esophagitis: Secondary | ICD-10-CM | POA: Diagnosis not present

## 2020-09-08 DIAGNOSIS — F321 Major depressive disorder, single episode, moderate: Secondary | ICD-10-CM | POA: Diagnosis not present

## 2020-09-08 DIAGNOSIS — G4700 Insomnia, unspecified: Secondary | ICD-10-CM | POA: Diagnosis not present

## 2020-09-08 DIAGNOSIS — E785 Hyperlipidemia, unspecified: Secondary | ICD-10-CM | POA: Diagnosis not present

## 2020-09-08 DIAGNOSIS — M0579 Rheumatoid arthritis with rheumatoid factor of multiple sites without organ or systems involvement: Secondary | ICD-10-CM | POA: Diagnosis not present

## 2020-09-08 DIAGNOSIS — M47816 Spondylosis without myelopathy or radiculopathy, lumbar region: Secondary | ICD-10-CM | POA: Diagnosis not present

## 2020-09-08 DIAGNOSIS — I110 Hypertensive heart disease with heart failure: Secondary | ICD-10-CM | POA: Diagnosis not present

## 2020-09-08 DIAGNOSIS — E213 Hyperparathyroidism, unspecified: Secondary | ICD-10-CM | POA: Diagnosis not present

## 2020-09-08 DIAGNOSIS — M797 Fibromyalgia: Secondary | ICD-10-CM | POA: Diagnosis not present

## 2020-09-08 DIAGNOSIS — I5032 Chronic diastolic (congestive) heart failure: Secondary | ICD-10-CM | POA: Diagnosis not present

## 2020-09-08 DIAGNOSIS — M3500 Sicca syndrome, unspecified: Secondary | ICD-10-CM | POA: Diagnosis not present

## 2020-09-08 DIAGNOSIS — E559 Vitamin D deficiency, unspecified: Secondary | ICD-10-CM | POA: Diagnosis not present

## 2020-09-08 DIAGNOSIS — I252 Old myocardial infarction: Secondary | ICD-10-CM | POA: Diagnosis not present

## 2020-09-08 DIAGNOSIS — J449 Chronic obstructive pulmonary disease, unspecified: Secondary | ICD-10-CM | POA: Diagnosis not present

## 2020-09-08 DIAGNOSIS — I7300 Raynaud's syndrome without gangrene: Secondary | ICD-10-CM | POA: Diagnosis not present

## 2020-09-08 DIAGNOSIS — Z8543 Personal history of malignant neoplasm of ovary: Secondary | ICD-10-CM | POA: Diagnosis not present

## 2020-09-08 DIAGNOSIS — D509 Iron deficiency anemia, unspecified: Secondary | ICD-10-CM | POA: Diagnosis not present

## 2020-09-08 DIAGNOSIS — I2510 Atherosclerotic heart disease of native coronary artery without angina pectoris: Secondary | ICD-10-CM | POA: Diagnosis not present

## 2020-09-08 DIAGNOSIS — I420 Dilated cardiomyopathy: Secondary | ICD-10-CM | POA: Diagnosis not present

## 2020-09-11 DIAGNOSIS — M0579 Rheumatoid arthritis with rheumatoid factor of multiple sites without organ or systems involvement: Secondary | ICD-10-CM | POA: Diagnosis not present

## 2020-09-11 DIAGNOSIS — I420 Dilated cardiomyopathy: Secondary | ICD-10-CM | POA: Diagnosis not present

## 2020-09-11 DIAGNOSIS — M62838 Other muscle spasm: Secondary | ICD-10-CM | POA: Diagnosis not present

## 2020-09-11 DIAGNOSIS — M159 Polyosteoarthritis, unspecified: Secondary | ICD-10-CM | POA: Diagnosis not present

## 2020-09-11 DIAGNOSIS — R54 Age-related physical debility: Secondary | ICD-10-CM | POA: Diagnosis not present

## 2020-09-11 DIAGNOSIS — Z515 Encounter for palliative care: Secondary | ICD-10-CM | POA: Diagnosis not present

## 2020-09-12 DIAGNOSIS — I480 Paroxysmal atrial fibrillation: Secondary | ICD-10-CM | POA: Diagnosis not present

## 2020-09-12 DIAGNOSIS — M0579 Rheumatoid arthritis with rheumatoid factor of multiple sites without organ or systems involvement: Secondary | ICD-10-CM | POA: Diagnosis not present

## 2020-09-12 DIAGNOSIS — I110 Hypertensive heart disease with heart failure: Secondary | ICD-10-CM | POA: Diagnosis not present

## 2020-09-12 DIAGNOSIS — I5032 Chronic diastolic (congestive) heart failure: Secondary | ICD-10-CM | POA: Diagnosis not present

## 2020-09-12 DIAGNOSIS — M47816 Spondylosis without myelopathy or radiculopathy, lumbar region: Secondary | ICD-10-CM | POA: Diagnosis not present

## 2020-09-12 DIAGNOSIS — J449 Chronic obstructive pulmonary disease, unspecified: Secondary | ICD-10-CM | POA: Diagnosis not present

## 2020-09-13 DIAGNOSIS — J449 Chronic obstructive pulmonary disease, unspecified: Secondary | ICD-10-CM | POA: Diagnosis not present

## 2020-09-13 DIAGNOSIS — M47816 Spondylosis without myelopathy or radiculopathy, lumbar region: Secondary | ICD-10-CM | POA: Diagnosis not present

## 2020-09-13 DIAGNOSIS — I110 Hypertensive heart disease with heart failure: Secondary | ICD-10-CM | POA: Diagnosis not present

## 2020-09-13 DIAGNOSIS — I480 Paroxysmal atrial fibrillation: Secondary | ICD-10-CM | POA: Diagnosis not present

## 2020-09-13 DIAGNOSIS — I5032 Chronic diastolic (congestive) heart failure: Secondary | ICD-10-CM | POA: Diagnosis not present

## 2020-09-13 DIAGNOSIS — M0579 Rheumatoid arthritis with rheumatoid factor of multiple sites without organ or systems involvement: Secondary | ICD-10-CM | POA: Diagnosis not present

## 2020-09-15 DIAGNOSIS — M47816 Spondylosis without myelopathy or radiculopathy, lumbar region: Secondary | ICD-10-CM | POA: Diagnosis not present

## 2020-09-15 DIAGNOSIS — I110 Hypertensive heart disease with heart failure: Secondary | ICD-10-CM | POA: Diagnosis not present

## 2020-09-15 DIAGNOSIS — I480 Paroxysmal atrial fibrillation: Secondary | ICD-10-CM | POA: Diagnosis not present

## 2020-09-15 DIAGNOSIS — M0579 Rheumatoid arthritis with rheumatoid factor of multiple sites without organ or systems involvement: Secondary | ICD-10-CM | POA: Diagnosis not present

## 2020-09-15 DIAGNOSIS — I5032 Chronic diastolic (congestive) heart failure: Secondary | ICD-10-CM | POA: Diagnosis not present

## 2020-09-15 DIAGNOSIS — J449 Chronic obstructive pulmonary disease, unspecified: Secondary | ICD-10-CM | POA: Diagnosis not present

## 2020-09-20 ENCOUNTER — Telehealth: Payer: Self-pay | Admitting: Family

## 2020-09-20 DIAGNOSIS — M0579 Rheumatoid arthritis with rheumatoid factor of multiple sites without organ or systems involvement: Secondary | ICD-10-CM | POA: Diagnosis not present

## 2020-09-20 DIAGNOSIS — I480 Paroxysmal atrial fibrillation: Secondary | ICD-10-CM | POA: Diagnosis not present

## 2020-09-20 DIAGNOSIS — I5032 Chronic diastolic (congestive) heart failure: Secondary | ICD-10-CM | POA: Diagnosis not present

## 2020-09-20 DIAGNOSIS — I110 Hypertensive heart disease with heart failure: Secondary | ICD-10-CM | POA: Diagnosis not present

## 2020-09-20 DIAGNOSIS — J449 Chronic obstructive pulmonary disease, unspecified: Secondary | ICD-10-CM | POA: Diagnosis not present

## 2020-09-20 DIAGNOSIS — M47816 Spondylosis without myelopathy or radiculopathy, lumbar region: Secondary | ICD-10-CM | POA: Diagnosis not present

## 2020-09-20 NOTE — Telephone Encounter (Signed)
Called twice number busy both times

## 2020-09-20 NOTE — Telephone Encounter (Signed)
Pt was seen in ER on 8/25 for possible heart attack. Wants to schedule appt to see Angela Wong this Friday.  Please advise and call patient.

## 2020-09-20 NOTE — Telephone Encounter (Signed)
Appt made per patient needs Friday wants [place] only

## 2020-09-22 ENCOUNTER — Ambulatory Visit (INDEPENDENT_AMBULATORY_CARE_PROVIDER_SITE_OTHER): Payer: Medicare Other | Admitting: Pharmacist

## 2020-09-22 DIAGNOSIS — I4821 Permanent atrial fibrillation: Secondary | ICD-10-CM

## 2020-09-22 NOTE — Patient Instructions (Signed)
Visit Information  PATIENT GOALS:  Goals Addressed               This Visit's Progress     Patient Stated     AFIB (pt-stated)        Current Barriers:  Unable to achieve control of INR GOAL  Suboptimal therapeutic regimen for other conditions -- patient not taking most other medications  Pharmacist Clinical Goal(s):  Over the next 90 days, patient will achieve control of INR as evidenced by GOAL INR 2-3 through collaboration with PharmD and provider.    Interventions: 1:1 collaboration with [name], FNP regarding development and update of comprehensive plan of care as evidenced by provider attestation and co-signature Inter-disciplinary care team collaboration (see longitudinal plan of care) Comprehensive medication review performed; medication list updated in electronic medical record  Atrial Fibrillation: Uncontrolled; current rate/rhythm control; ATENOLOL (1/2 PILL PER CARDS-PATIENT REPORTS NOT TOLERATING ANYTHING ELSE anticoagulant treatment: WARFARIN CHADS2VASc score: 5 Home blood pressure, heart rate readings: WNL--SEEN BY PALLIATIVE NP AT HOME FREQUENTLY Educated on [place].  PATIENT GETS HOME INR CHECKS EVERY Tuesday.  HER LAST INR WAS 2.9 (CONTINUE CURRENT MANAGEMENT) Recommended CONTINUE WARFARIN, DIETARY CONSISTENCY   Patient Goals/Self-Care Activities Over the next 90 days, patient will:  - take medications as prescribed focus on medication adherence  Follow Up Plan: Telephone follow up appointment with care management team member scheduled for: 1 MONTH         The patient verbalized understanding of instructions, educational materials, and care plan provided today and declined offer to receive copy of patient instructions, educational materials, and care plan.   Telephone follow up appointment with care management team member scheduled for: 1  MONTH  Signature Regina Eck, PharmD, BCPS Clinical Pharmacist, [place]  II Phone [phone]

## 2020-09-22 NOTE — Progress Notes (Signed)
Chronic Care Management Pharmacy Note  09/22/2020 Name:  KANETRA HO MRN:  [id number] DOB:  1940-03-24  Summary: AFIB  Recommendations/Changes made from today's visit:  Atrial Fibrillation: Uncontrolled; current rate/rhythm control; ATENOLOL (1/2 PILL PER CARDS-PATIENT REPORTS NOT TOLERATING ANYTHING ELSE anticoagulant treatment: WARFARIN CHADS2VASc score: 5 Home blood pressure, heart rate readings: WNL--SEEN BY PALLIATIVE NP AT HOME FREQUENTLY Educated on [place] MEDICATIONS AS PRESCRIBED AND EATING A CONSISTENT DIET.  PATIENT GETS HOME INR CHECKS EVERY Tuesday.  HER LAST INR WAS 2.9 (CONTINUE CURRENT MANAGEMENT) Recommended CONTINUE WARFARIN, DIETARY CONSISTENCY  Follow Up Plan: Telephone follow up appointment with care management team member scheduled for: 1 MONTH  Subjective: MICHALINA CALBERT is an 80 y.o. year old female who is a primary patient of [name], FNP.  The CCM team was consulted for assistance with disease management and care coordination needs.    Engaged with patient by telephone for initial visit in response to provider referral for pharmacy case management and/or care coordination services.   Consent to Services:  The patient was given information about Chronic Care Management services, agreed to services, and gave verbal consent prior to initiation of services.  Please see initial visit note for detailed documentation.   Patient Care Team: [name], FNP as PCP - General (Nurse Practitioner) Minus Breeding, MD as PCP - Cardiology (Cardiology) Jerene Bears, MD as Consulting Physician (Gastroenterology) Lahoma Rocker, MD as Consulting Physician (Rheumatology) Gaynelle Arabian, MD as Consulting Physician (Orthopedic Surgery) Jarome Matin, MD as Consulting Physician (Dermatology) Penni Bombard, MD as Consulting Physician (Neurology) Valentina Shaggy, MD as Consulting Physician (Allergy and  Immunology) Ilean China, RN as Case Manager  Objective:  Lab Results  Component Value Date   CREATININE 0.71 09/07/2020   CREATININE 0.78 07/05/2019   CREATININE 0.78 07/04/2019    No results found for: HGBA1C Last diabetic Eye exam: No results found for: HMDIABEYEEXA  Last diabetic Foot exam: No results found for: HMDIABFOOTEX      Component Value Date/Time   CHOL 119 07/02/2019 0500   CHOL 152 03/03/2019 1040   TRIG 42 07/02/2019 0500   HDL 51 07/02/2019 0500   HDL 69 03/03/2019 1040   CHOLHDL 2.3 07/02/2019 0500   VLDL 8 07/02/2019 0500   LDLCALC 60 07/02/2019 0500   LDLCALC 68 03/03/2019 1040    Hepatic Function Latest Ref Rng & Units 09/07/2020 06/22/2019 03/03/2019  Total Protein 6.5 - 8.1 g/dL 5.7(L) 6.5 6.4  Albumin 3.5 - 5.0 g/dL 3.6 3.8 4.2  AST 15 - 41 U/L 62(H) 17 17  ALT 0 - 44 U/L 33 15 31  Alk Phosphatase 38 - 126 U/L 90 76 99  Total Bilirubin 0.3 - 1.2 mg/dL 1.8(H) 1.0 1.0  Bilirubin, Direct 0.0 - 0.2 mg/dL 0.6(H) - -    Lab Results  Component Value Date/Time   TSH 2.760 03/03/2019 10:40 AM   TSH 1.260 11/23/2018 12:28 PM   FREET4 1.38 01/24/2014 11:35 AM    CBC Latest Ref Rng & Units 09/07/2020 07/05/2019 07/04/2019  WBC 4.0 - 10.5 K/uL 9.8 4.7 5.7  Hemoglobin 12.0 - 15.0 g/dL 14.7 11.5(L) 11.0(L)  Hematocrit 36.0 - 46.0 % 45.1 35.0(L) 33.6(L)  Platelets 150 - 400 K/uL 144(L) 204 198    Lab Results  Component Value Date/Time   VD25OH 48.3 11/23/2018 12:28 PM   VD25OH 35.9 07/11/2016 12:27 PM    Clinical ASCVD: Yes  The ASCVD Risk  score (Arnett DK, et al., 2019) failed to calculate for the following reasons:   The 2019 ASCVD risk score is only valid for ages 68 to 68   The patient has a prior MI or stroke diagnosis    Other: (CHADS2VASc if Afib, PHQ9 if depression, MMRC or CAT for COPD, ACT, DEXA)  Social History   Tobacco Use  Smoking Status Never  Smokeless Tobacco Never   BP Readings from Last 3 Encounters:  09/07/20 (!) 163/98   08/25/19 112/64  07/22/19 117/72   Pulse Readings from Last 3 Encounters:  09/07/20 92  08/25/19 77  07/22/19 70   Wt Readings from Last 3 Encounters:  09/07/20 145 lb 8.1 oz (66 kg)  07/22/19 146 lb (66.2 kg)  07/05/19 141 lb 1.5 oz (64 kg)    Assessment: Review of patient past medical history, allergies, medications, health status, including review of consultants reports, laboratory and other test data, was performed as part of comprehensive evaluation and provision of chronic care management services.   SDOH:  (Social Determinants of Health) assessments and interventions performed:    CCM Care Plan  Allergies  Allergen Reactions   Cephalosporins Hives and Shortness Of Breath   Ciprofloxacin Hives and Shortness Of Breath   Diltiazem Shortness Of Breath    Swollen throat    Doxycycline Hives and Shortness Of Breath   Horse-Derived Products Anaphylaxis   Ketek [Telithromycin] Palpitations    Chest discomfort   Nitrofuran Derivatives Anaphylaxis and Hives    blisters   Nitrous Oxide Nausea And Vomiting    Severe due to Sjogrens (Auto-Immune Disease)   Other Anaphylaxis    ALLERGY TO HORSE SERUM   Penicillins Hives and Shortness Of Breath        Pentazocine Other (See Comments)    Other reaction(s): Mental Status Changes (intolerance) Altered Mental Status  Altered Mental Status  Altered Mental Status    Sulfa Antibiotics Hives and Shortness Of Breath   Trovan [Alatrofloxacin] Palpitations, Other (See Comments) and Anaphylaxis    Chest pain, dizziness, irregular pulse   Zinc Gelatin [Zinc] Anaphylaxis   Amlodipine Swelling   Calcium Channel Blockers     Respiratory distress   Carvedilol Other (See Comments)    Dizziness, "joint pain, depression"   Clindamycin/Lincomycin     CP, lock jaw   Codeine Nausea Only   Cymbalta [Duloxetine Hcl] Swelling   Diovan [Valsartan] Swelling   Lisinopril Swelling   Sertraline Other (See Comments)    confusion   Tramadol  Nausea Only   Loteprednol Etabonate Rash    Medications Reviewed Today     Reviewed by Lavera Guise, [facility] (Pharmacist) on 09/22/20 at 0907  Med List Status: <None>   Medication Order Taking? Sig Documenting Provider Last Dose Status Informant  albuterol (VENTOLIN HFA) 108 (90 Base) MCG/ACT inhaler [id number]  INHALE 2 PUFFS BY MOUTH EVERY 6 HOURS AS NEEDED FOR WHEEZING  Patient taking differently: Inhale 2 puffs into the lungs every 6 (six) hours as needed for wheezing or shortness of breath. INHALE 2 PUFFS BY MOUTH EVERY 6 HOURS AS NEEDED FOR WHEEZING   Terald Sleeper, PA-C  Active Nursing Home Medication Administration Guide (MAG)  atenolol (TENORMIN) 25 MG tablet [id number] Yes Take 0.5 tablets (12.5 mg total) by mouth 2 (two) times daily. Furth, Cadence H, PA-C Taking Active   atorvastatin (LIPITOR) 40 MG tablet [id number] No Take 40 mg by mouth every morning.  Patient not taking: Reported on 09/22/2020   [provider] Not Taking Active   Baclofen 5 MG TABS [id number] No   Patient not taking: Reported on 09/22/2020   [provider] Not Taking Active   bumetanide (BUMEX) 1 MG tablet [id number] No Take 1 mg by mouth every other day.  Patient not taking: Reported on 09/22/2020   [provider] Not Taking Active Nursing Home Medication Administration Guide (MAG)  Cholecalciferol (VITAMIN D) 2000 UNITS CAPS [id number] No Take 2 capsules by mouth every evening.   Patient not taking: Reported on 09/22/2020   [provider] Not Taking Active Nursing Home Medication Administration Guide (MAG)  diphenhydrAMINE (BENADRYL) 25 MG tablet [id number] Yes Take 25 mg by mouth daily as needed for itching or allergies.  [provider] Taking Active Nursing Home Medication Administration Guide (MAG)  docusate sodium (COLACE) 100 MG capsule [id number] No Take 400 mg by mouth every morning.   Patient not taking: Reported on 09/22/2020   [provider] Not Taking  Active Nursing Home Medication Administration Guide (MAG)           Med Note Lyndal Rainbow   Tue Jun 22, 2019  6:31 PM)    esomeprazole (NEXIUM) 40 MG capsule [id number] No Take 1 capsule by mouth twice daily  Patient not taking: Reported on 09/22/2020   Jerene Bears, MD Not Taking Active   fexofenadine (ALLEGRA) 180 MG tablet [id number] Yes Take 180 mg by mouth every morning.  [provider] Taking Active Nursing Home Medication Administration Guide (MAG)  fluticasone (FLOVENT HFA) 220 MCG/ACT inhaler [id number]  Inhale 1 puff into the lungs 2 (two) times daily. Althea Charon, FNP  Active   furosemide (LASIX) 20 MG tablet [id number] No Take 20 mg by mouth daily.  Patient not taking: Reported on 09/22/2020   [provider] Not Taking Active   levothyroxine (EUTHYROX) 50 MCG tablet [id number] Yes Take 2 tablets (100 mcg total) by mouth every morning. [name], FNP Taking Active   losartan (COZAAR) 50 MG tablet [id number] No Take 1 tablet (50 mg total) by mouth daily.  Patient not taking: Reported on 09/22/2020   Ilean China Not Taking Active   Magnesium 400 MG TABS [id number] No Take 400 mg by mouth every evening.   Patient not taking: Reported on 09/22/2020   [provider] Not Taking Active Nursing Home Medication Administration Guide (MAG)           Med Note HEMANN, JEFFREY W   Thu Dec 25, 2017 12:41 AM)    melatonin 3 MG TABS tablet [id number] No Take 3 mg by mouth at bedtime.  Patient not taking: Reported on 09/22/2020   [provider] Not Taking Active Nursing Home Medication Administration Guide (MAG)  methocarbamol (ROBAXIN) 500 MG tablet [id number]  Take 1 tablet (500 mg total) by mouth 3 (three) times daily. Heath Lark D, DO  Active Nursing Home Medication Administration Guide (MAG)  methotrexate 50 MG/2ML injection [id number] No Inject into the skin.  Patient not taking: Reported on 09/22/2020   [provider] Not Taking Active    montelukast (SINGULAIR) 10 MG tablet [id number] No TAKE 1 TABLET BY MOUTH AT BEDTIME  Patient not taking: Reported on 09/22/2020   Valentina Shaggy, MD Not Taking Active   montelukast (SINGULAIR) 10 MG tablet [id number] No Take 1 tablet (10 mg total) by mouth at bedtime.  Patient not taking: Reported on 09/22/2020   Valentina Shaggy, MD Not Taking Active  Olopatadine HCl 0.2 % SOLN [id number] Yes Apply 1 drop to eye daily as needed (for allergy eye). Valentina Shaggy, MD Taking Active Nursing Home Medication Administration Guide (MAG)  ondansetron [facility]) 4 MG tablet [id number] No Take 1 tablet (4 mg total) by mouth every 8 (eight) hours as needed for nausea or vomiting.  Patient not taking: Reported on 09/22/2020   [name], FNP Not Taking Active   oxyCODONE-acetaminophen (PERCOCET/ROXICET) 5-325 MG tablet [id number] Yes Take 1 tablet by mouth every 6 (six) hours as needed for severe pain. [provider] Taking Active   POLY-IRON 150 FORTE 150-25-1 MG-MCG-MG CAPS [id number] No Take 1 capsule by mouth once daily  Patient not taking: Reported on 09/22/2020   [name], FNP Not Taking Active   polyethylene glycol (MIRALAX / GLYCOLAX) 17 g packet [id number]  Take 17 g by mouth daily as needed for mild constipation or moderate constipation (For Constipation).  [provider]  Active Nursing Home Medication Administration Guide (MAG)  predniSONE (DELTASONE) 5 MG tablet [id number] Yes Take 5 mg by mouth every morning. [provider] Taking Active Nursing Home Medication Administration Guide (MAG)           Med Note Lyndal Rainbow   Tue Jun 22, 2019  6:33 PM)    RESTASIS 0.05 % ophthalmic emulsion [id number] Yes INSTILL 1 DROP INTO [facility] TWICE DAILY [name], FNP Taking Active   Simethicone 125 MG CAPS [id number] No Take 2 capsules by mouth daily as needed (for gas).   Patient not taking: Reported on 09/22/2020   [provider] Not Taking  Active Nursing Home Medication Administration Guide [facility])  SYMBICORT 160-4.5 MCG/ACT inhaler [id number] No Inhale 2 puffs by mouth twice daily  Patient not taking: Reported on 09/22/2020   Valentina Shaggy, MD Not Taking Active Nursing Home Medication Administration Guide (MAG)  traZODone (DESYREL) 150 MG tablet [id number] No Take 1 tablet (150 mg total) by mouth at bedtime.  Patient not taking: Reported on 09/22/2020   [name], FNP Not Taking Active Nursing Home Medication Administration Guide (MAG)  vitamin B-12 (CYANOCOBALAMIN) 500 MCG tablet [id number] No Take 1 tablet (500 mcg total) by mouth daily. (Needs to be seen before next refill)  Patient not taking: Reported on 09/22/2020   [name], FNP Not Taking Active   warfarin (COUMADIN) 1 MG tablet [id number] Yes TAKE 4MG MONDAY, WEDNESDAY, AND FRIDAY. TAKE 2 MG TUESDAY, THURSDAY, SATURDAY, AND SUNDAY [name], FNP Taking Active             Patient Active Problem List   Diagnosis Date Noted   Pacemaker 07/22/2019   Drug intolerance 07/22/2019   Edema of both lower extremities 07/22/2019   Subtherapeutic international normalized ratio (INR) 07/05/2019   CAD (coronary artery disease) 07/05/2019   Non-STEMI (non-ST elevated myocardial infarction) ([place]) 07/01/2019   Chronic constipation 06/29/2019   Hypertensive heart disease with heart failure ([place]) 06/29/2019   Decompensated heart failure ([place]) 06/22/2019   Pressure injury of skin 06/22/2019   RA (rheumatoid arthritis) ([place]) 05/04/2019   Anticoagulation goal of INR 2 to 3 12/18/2017   Depression, major, single episode, moderate ([place]) 09/12/2017   Chest pain 08/25/2017   Generalized weakness 08/25/2017   Seasonal and perennial allergic rhinitis 12/31/2016   Allergic rhinitis with a nonallergic component 10/01/2016   Itching 10/01/2016   Skeeter syndrome 10/01/2016   Steroid-induced osteopenia 07/11/2016   Hyperparathyroidism ([place]) 01/16/2016   Iron  deficiency anemia 12/18/2015   Insomnia 12/18/2015   Hypothyroidism 12/18/2015   Rotator cuff tear 03/23/2014   Osteoarthritis    Moderate persistent asthma without complication    Ovarian cancer (HCC)    Fibromyalgia    RLS (restless legs syndrome)    Cerebral vasculitis    Sjogren's syndrome (HCC)    Raynaud's disease    MVP (mitral valve prolapse)    IBS (irritable bowel syndrome)    Congestive dilated cardiomyopathy ([place]) 02/09/2014   Dyspnea 12/13/2013   Dyslipidemia 11/26/2013   Malaise and fatigue 11/26/2013   Allergy to multiple antibiotics 11/02/2013   History of ovarian cancer 10/01/2013   GERD (gastroesophageal reflux disease) 10/01/2013   Hypertension 10/01/2013   Atrial fibrillation, permanent ([place]) 07/27/2013    Immunization History  Administered Date(s) Administered   Fluad Quad(high Dose 65+) 11/23/2018   Influenza, High Dose Seasonal PF 10/09/2016, 10/27/2017   Influenza-Unspecified 10/14/2012, 10/29/2013, 10/15/2015   PFIZER(Purple Top)SARS-COV-2 Vaccination 10/04/2019, 12/02/2019   Pneumococcal Polysaccharide-23 10/29/2013   Td 07/24/2009    Conditions to be addressed/monitored: Atrial Fibrillation  Care Plan : PHARMD MEDICATION MANAGEMENT  Updates made by Lavera Guise, [place] since 09/22/2020 12:00 AM     Problem: POLYPHARMACY      Long-Range Goal: MED MANAGEMENT; AFIB   Note:   Current Barriers:  Unable to achieve control of INR GOAL  Suboptimal therapeutic regimen for    Pharmacist Clinical Goal(s):  Over the next 90 days, patient will achieve control of INR as evidenced by GOAL INR 2-3  through collaboration with PharmD and provider.    Interventions: 1:1 collaboration with [name], FNP regarding development and update of comprehensive plan of care as evidenced by provider attestation and co-signature Inter-disciplinary care team collaboration (see longitudinal plan of care) Comprehensive medication review performed; medication  list updated in electronic medical record  Atrial Fibrillation: Uncontrolled; current rate/rhythm control; ATENOLOL (1/2 PILL PER CARDS-PATIENT REPORTS NOT TOLERATING ANYTHING ELSE anticoagulant treatment: WARFARIN CHADS2VASc score: 5 Home blood pressure, heart rate readings: WNL--SEEN BY PALLIATIVE NP AT HOME FREQUENTLY Educated on [place].  PATIENT GETS HOME INR CHECKS EVERY Tuesday.  HER LAST INR WAS 2.9 (CONTINUE CURRENT MANAGEMENT) Recommended CONTINUE WARFARIN, DIETARY CONSISTENCY   Patient Goals/Self-Care Activities Over the next 90 days, patient will:  - take medications as prescribed focus on medication adherence  Follow Up Plan: Telephone follow up appointment with care management team member scheduled for: 1 MONTH      Medication Assistance: None required.  Patient affirms current coverage meets needs.  Patient's preferred pharmacy is:  [facility] [address] [place] [address] Phone: [phone] Fax: [phone]  Follow Up:  Patient agrees to Care Plan and Follow-up.  Plan: Telephone follow up appointment with care management team member scheduled for:  1 MONTH   Regina Eck, PharmD, BCPS Clinical Pharmacist, [place]  [facility] Phone [phone]

## 2020-09-23 DIAGNOSIS — I480 Paroxysmal atrial fibrillation: Secondary | ICD-10-CM | POA: Diagnosis not present

## 2020-09-23 DIAGNOSIS — M0579 Rheumatoid arthritis with rheumatoid factor of multiple sites without organ or systems involvement: Secondary | ICD-10-CM | POA: Diagnosis not present

## 2020-09-23 DIAGNOSIS — M47816 Spondylosis without myelopathy or radiculopathy, lumbar region: Secondary | ICD-10-CM | POA: Diagnosis not present

## 2020-09-23 DIAGNOSIS — I5032 Chronic diastolic (congestive) heart failure: Secondary | ICD-10-CM | POA: Diagnosis not present

## 2020-09-23 DIAGNOSIS — J449 Chronic obstructive pulmonary disease, unspecified: Secondary | ICD-10-CM | POA: Diagnosis not present

## 2020-09-23 DIAGNOSIS — I110 Hypertensive heart disease with heart failure: Secondary | ICD-10-CM | POA: Diagnosis not present

## 2020-09-25 DIAGNOSIS — M47816 Spondylosis without myelopathy or radiculopathy, lumbar region: Secondary | ICD-10-CM | POA: Diagnosis not present

## 2020-09-25 DIAGNOSIS — M0579 Rheumatoid arthritis with rheumatoid factor of multiple sites without organ or systems involvement: Secondary | ICD-10-CM | POA: Diagnosis not present

## 2020-09-25 DIAGNOSIS — J449 Chronic obstructive pulmonary disease, unspecified: Secondary | ICD-10-CM | POA: Diagnosis not present

## 2020-09-25 DIAGNOSIS — I480 Paroxysmal atrial fibrillation: Secondary | ICD-10-CM | POA: Diagnosis not present

## 2020-09-25 DIAGNOSIS — I5032 Chronic diastolic (congestive) heart failure: Secondary | ICD-10-CM | POA: Diagnosis not present

## 2020-09-25 DIAGNOSIS — I110 Hypertensive heart disease with heart failure: Secondary | ICD-10-CM | POA: Diagnosis not present

## 2020-09-26 ENCOUNTER — Telehealth: Payer: Self-pay | Admitting: *Deleted

## 2020-09-26 DIAGNOSIS — I480 Paroxysmal atrial fibrillation: Secondary | ICD-10-CM | POA: Diagnosis not present

## 2020-09-26 DIAGNOSIS — M0579 Rheumatoid arthritis with rheumatoid factor of multiple sites without organ or systems involvement: Secondary | ICD-10-CM | POA: Diagnosis not present

## 2020-09-26 DIAGNOSIS — I4821 Permanent atrial fibrillation: Secondary | ICD-10-CM

## 2020-09-26 DIAGNOSIS — I110 Hypertensive heart disease with heart failure: Secondary | ICD-10-CM | POA: Diagnosis not present

## 2020-09-26 DIAGNOSIS — J449 Chronic obstructive pulmonary disease, unspecified: Secondary | ICD-10-CM | POA: Diagnosis not present

## 2020-09-26 DIAGNOSIS — I5032 Chronic diastolic (congestive) heart failure: Secondary | ICD-10-CM | POA: Diagnosis not present

## 2020-09-26 DIAGNOSIS — Z7901 Long term (current) use of anticoagulants: Secondary | ICD-10-CM | POA: Diagnosis not present

## 2020-09-26 DIAGNOSIS — M47816 Spondylosis without myelopathy or radiculopathy, lumbar region: Secondary | ICD-10-CM | POA: Diagnosis not present

## 2020-09-26 NOTE — Telephone Encounter (Signed)
Description   INR was 3.4  today (goal is 2.0 to 3.0)   Hold today's dose (09/26/20),  then decrease 4 mg to Monday and Friday and thene 2 mg daily.

## 2020-09-26 NOTE — Telephone Encounter (Signed)
On Monday, Wednesday , Friday takes 4. On Tuesday, Thursday Saturday and Sunday '2mg'$ .  And ate a big salad last night,

## 2020-09-26 NOTE — Telephone Encounter (Signed)
Description   INR was 3.4  today (goal is 2.0 to 3.0)   Hold today's dose (09/26/20),  then take 2 mg daily.

## 2020-09-26 NOTE — Telephone Encounter (Signed)
Patient is agreeable with taking 4 on Monday and Friday and 2 rest of the days and holding today.

## 2020-09-26 NOTE — Telephone Encounter (Signed)
Fax received mdINR PT/INR self testing service Test date/time 09/26/2020 9:59am INR 3.4

## 2020-09-27 DIAGNOSIS — D485 Neoplasm of uncertain behavior of skin: Secondary | ICD-10-CM | POA: Diagnosis not present

## 2020-09-27 DIAGNOSIS — I872 Venous insufficiency (chronic) (peripheral): Secondary | ICD-10-CM | POA: Diagnosis not present

## 2020-09-27 DIAGNOSIS — I8311 Varicose veins of right lower extremity with inflammation: Secondary | ICD-10-CM | POA: Diagnosis not present

## 2020-09-27 DIAGNOSIS — Z85828 Personal history of other malignant neoplasm of skin: Secondary | ICD-10-CM | POA: Diagnosis not present

## 2020-09-27 DIAGNOSIS — I8312 Varicose veins of left lower extremity with inflammation: Secondary | ICD-10-CM | POA: Diagnosis not present

## 2020-09-27 DIAGNOSIS — L821 Other seborrheic keratosis: Secondary | ICD-10-CM | POA: Diagnosis not present

## 2020-09-27 DIAGNOSIS — L218 Other seborrheic dermatitis: Secondary | ICD-10-CM | POA: Diagnosis not present

## 2020-09-27 DIAGNOSIS — L309 Dermatitis, unspecified: Secondary | ICD-10-CM | POA: Diagnosis not present

## 2020-09-29 ENCOUNTER — Other Ambulatory Visit: Payer: Self-pay

## 2020-09-29 ENCOUNTER — Ambulatory Visit (INDEPENDENT_AMBULATORY_CARE_PROVIDER_SITE_OTHER): Payer: Medicare Other | Admitting: Family

## 2020-09-29 ENCOUNTER — Telehealth: Payer: Self-pay | Admitting: Family

## 2020-09-29 ENCOUNTER — Encounter: Payer: Self-pay | Admitting: Family

## 2020-09-29 VITALS — BP 120/61 | HR 70 | Temp 97.9°F | Resp 20

## 2020-09-29 DIAGNOSIS — R300 Dysuria: Secondary | ICD-10-CM

## 2020-09-29 DIAGNOSIS — R5383 Other fatigue: Secondary | ICD-10-CM | POA: Diagnosis not present

## 2020-09-29 DIAGNOSIS — I159 Secondary hypertension, unspecified: Secondary | ICD-10-CM | POA: Diagnosis not present

## 2020-09-29 DIAGNOSIS — M0579 Rheumatoid arthritis with rheumatoid factor of multiple sites without organ or systems involvement: Secondary | ICD-10-CM

## 2020-09-29 DIAGNOSIS — M1990 Unspecified osteoarthritis, unspecified site: Secondary | ICD-10-CM

## 2020-09-29 DIAGNOSIS — I110 Hypertensive heart disease with heart failure: Secondary | ICD-10-CM

## 2020-09-29 DIAGNOSIS — E039 Hypothyroidism, unspecified: Secondary | ICD-10-CM | POA: Diagnosis not present

## 2020-09-29 DIAGNOSIS — N3001 Acute cystitis with hematuria: Secondary | ICD-10-CM | POA: Diagnosis not present

## 2020-09-29 DIAGNOSIS — R5381 Other malaise: Secondary | ICD-10-CM | POA: Diagnosis not present

## 2020-09-29 DIAGNOSIS — Z09 Encounter for follow-up examination after completed treatment for conditions other than malignant neoplasm: Secondary | ICD-10-CM | POA: Diagnosis not present

## 2020-09-29 DIAGNOSIS — E43 Unspecified severe protein-calorie malnutrition: Secondary | ICD-10-CM

## 2020-09-29 DIAGNOSIS — I25119 Atherosclerotic heart disease of native coronary artery with unspecified angina pectoris: Secondary | ICD-10-CM

## 2020-09-29 DIAGNOSIS — Z889 Allergy status to unspecified drugs, medicaments and biological substances status: Secondary | ICD-10-CM

## 2020-09-29 DIAGNOSIS — I4821 Permanent atrial fibrillation: Secondary | ICD-10-CM

## 2020-09-29 LAB — URINALYSIS, COMPLETE
Bilirubin, UA: NEGATIVE
Glucose, UA: NEGATIVE
Nitrite, UA: POSITIVE — AB
Specific Gravity, UA: 1.025 (ref 1.005–1.030)
Urobilinogen, Ur: 0.2 mg/dL (ref 0.2–1.0)
pH, UA: 5 (ref 5.0–7.5)

## 2020-09-29 LAB — MICROSCOPIC EXAMINATION
RBC, Urine: >30 /hpf — AB (ref 0–2)
Renal Epithel, UA: NONE SEEN /hpf
WBC, UA: >30 /hpf — AB (ref 0–5)

## 2020-09-29 MED ORDER — AZITHROMYCIN 250 MG PO TABS: ORAL_TABLET | ORAL | 0 refills | Status: DC

## 2020-09-29 NOTE — Patient Instructions (Signed)
Urinary Tract Infection, Adult A urinary tract infection (UTI) is an infection of any part of the urinary tract. The urinary tract includes the kidneys, ureters, bladder, and urethra. These organs make, store, and get rid of urine in the body. An upper UTI affects the ureters and kidneys. A lower UTI affects the bladder and urethra. What are the causes? Most urinary tract infections are caused by bacteria in your genital area around your urethra, where urine leaves your body. These bacteria grow and cause inflammation of your urinary tract. What increases the risk? You are more likely to develop this condition if: You have a urinary catheter that stays in place. You are not able to control when you urinate or have a bowel movement (incontinence). You are female and you: Use a spermicide or diaphragm for birth control. Have low estrogen levels. Are pregnant. You have certain genes that increase your risk. You are sexually active. You take antibiotic medicines. You have a condition that causes your flow of urine to slow down, such as: An enlarged prostate, if you are female. Blockage in your urethra. A kidney stone. A nerve condition that affects your bladder control (neurogenic bladder). Not getting enough to drink, or not urinating often. You have certain medical conditions, such as: Diabetes. A weak disease-fighting system (immunesystem). Sickle cell disease. Gout. Spinal cord injury. What are the signs or symptoms? Symptoms of this condition include: Needing to urinate right away (urgency). Frequent urination. This may include small amounts of urine each time you urinate. Pain or burning with urination. Blood in the urine. Urine that smells bad or unusual. Trouble urinating. Cloudy urine. Vaginal discharge, if you are female. Pain in the abdomen or the lower back. You may also have: Vomiting or a decreased appetite. Confusion. Irritability or tiredness. A fever or  chills. Diarrhea. The first symptom in older adults may be confusion. In some cases, they may not have any symptoms until the infection has worsened. How is this diagnosed? This condition is diagnosed based on your medical history and a physical exam. You may also have other tests, including: Urine tests. Blood tests. Tests for STIs (sexually transmitted infections). If you have had more than one UTI, a cystoscopy or imaging studies may be done to determine the cause of the infections. How is this treated? Treatment for this condition includes: Antibiotic medicine. Over-the-counter medicines to treat discomfort. Drinking enough water to stay hydrated. If you have frequent infections or have other conditions such as a kidney stone, you may need to see a health care provider who specializes in the urinary tract (urologist). In rare cases, urinary tract infections can cause sepsis. Sepsis is a life-threatening condition that occurs when the body responds to an infection. Sepsis is treated in the hospital with IV antibiotics, fluids, and other medicines. Follow these instructions at home: Medicines Take over-the-counter and prescription medicines only as told by your health care provider. If you were prescribed an antibiotic medicine, take it as told by your health care provider. Do not stop using the antibiotic even if you start to feel better. General instructions Make sure you: Empty your bladder often and completely. Do not hold urine for long periods of time. Empty your bladder after sex. Wipe from front to back after urinating or having a bowel movement if you are female. Use each tissue only one time when you wipe. Drink enough fluid to keep your urine pale yellow. Keep all follow-up visits. This is important. Contact a health care provider   if: Your symptoms do not get better after 1-2 days. Your symptoms go away and then return. Get help right away if: You have severe pain in your  back or your lower abdomen. You have a fever or chills. You have nausea or vomiting. Summary A urinary tract infection (UTI) is an infection of any part of the urinary tract, which includes the kidneys, ureters, bladder, and urethra. Most urinary tract infections are caused by bacteria in your genital area. Treatment for this condition often includes antibiotic medicines. If you were prescribed an antibiotic medicine, take it as told by your health care provider. Do not stop using the antibiotic even if you start to feel better. Keep all follow-up visits. This is important. This information is not intended to replace advice given to you by your health care provider. Make sure you discuss any questions you have with your health care provider. Document Revised: 08/13/2019 Document Reviewed: 08/13/2019 Elsevier Patient Education  2022 [employer]  

## 2020-09-29 NOTE — Telephone Encounter (Signed)
Spoke to patient - medications in chart updated for her

## 2020-09-29 NOTE — Telephone Encounter (Signed)
Patient aware.

## 2020-09-29 NOTE — Progress Notes (Signed)
Subjective:    Patient ID: Angela Wong, female    DOB: Jan 16, 1940, 80 y.o.   MRN: [id number]  Chief Complaint  Patient presents with  . ER follow up   Pt  presents to office today for chronic follow up and hospital follow up.   She went to the ED on 09/07/20 for chest pain. She had negative troponin, LFTs and lipase unremarkable,  X-ray negative for pelvic or hip fracture.  Plain film of the L-spine negative.  Chest x-ray negative pneumothorax.  She has several problems today that include  SOB with exertion, chronic pain, joint pain, weakness, stomach pain, muscle cramps, and fatigue today. She reports she has had to stop most of her medications because it is "enhanced with vit B and she is allergic".    PT is followed by Cardiologists for CHF and A Fib. She missed her last appointment a few weeks ago. She is taking warfarin. Followed by Rheumatologists.  Followed by Allergists for asthma and chronic allergies. She is also followed by chronic care management monthly and followed by Palliative Care.  Hypertension This is a chronic problem. The current episode started more than 1 year ago. The problem has been resolved since onset. The problem is controlled. Associated symptoms include malaise/fatigue, peripheral edema and shortness of breath. Risk factors for coronary artery disease include dyslipidemia. Past treatments include beta blockers. The current treatment provides moderate improvement. Identifiable causes of hypertension include a thyroid problem.  Thyroid Problem Presents for follow-up visit. Symptoms include constipation, depressed mood, diarrhea and fatigue. The symptoms have been stable. Her past medical history is significant for hyperlipidemia.  Dysuria  This is a new problem. The current episode started 1 to 4 weeks ago. The problem occurs intermittently. The quality of the pain is described as burning. The pain is mild. Associated symptoms include flank pain, frequency and  urgency. Pertinent negatives include no hematuria. She has tried increased fluids for the symptoms. The treatment provided mild relief.  Asthma She complains of cough and shortness of breath. This is a chronic problem. Associated symptoms include heartburn and malaise/fatigue. Her past medical history is significant for asthma.  Depression        This is a chronic problem.  The current episode started more than 1 year ago.   The onset quality is gradual.   Associated symptoms include fatigue, helplessness, hopelessness, insomnia, irritable, restlessness and sad.  Past medical history includes thyroid problem.   Gastroesophageal Reflux She complains of belching, coughing and heartburn. This is a chronic problem. The current episode started more than 1 year ago. The problem occurs occasionally. Associated symptoms include fatigue. She has tried a PPI for the symptoms. The treatment provided moderate relief.  Arthritis Presents for follow-up visit. She complains of pain and stiffness. The symptoms have been worsening. Affected locations include the left knee, right knee, left MCP, right MCP and neck. Her pain is at a severity of 4/10. Associated symptoms include diarrhea, dysuria and fatigue.  Insomnia Primary symptoms: difficulty falling asleep, frequent awakening, malaise/fatigue.   The current episode started more than one year. The onset quality is gradual. The problem occurs intermittently. PMH includes: depression.   Hyperlipidemia This is a chronic problem. The current episode started more than 1 year ago. Associated symptoms include shortness of breath. She is currently on no antihyperlipidemic treatment. The current treatment provides no improvement of lipids.     Review of Systems  Constitutional:  Positive for fatigue and malaise/fatigue.  Respiratory:  Positive for cough and shortness of breath.   Gastrointestinal:  Positive for constipation, diarrhea and heartburn.  Genitourinary:   Positive for dysuria, flank pain, frequency and urgency. Negative for hematuria.  Musculoskeletal:  Positive for arthritis and stiffness.  Psychiatric/Behavioral:  Positive for depression. The patient has insomnia.   All other systems reviewed and are negative.  Family History  Problem Relation Age of Onset  . COPD Mother   . Breast cancer Mother   . Allergic rhinitis Mother   . Heart disease Father        No details  . Kidney disease Father   . Allergic rhinitis Father   . Stroke Sister   . Arthritis/Rheumatoid Sister   . Asthma Sister   . Lupus Sister   . Heart attack Sister   . Stroke Paternal Grandmother   . Scleroderma Grandchild   . Thyroid disease Other   . Breast cancer Maternal Aunt        x 2   Social History   Socioeconomic History  . Marital status: Single    Spouse name: Not on file  . Number of children: Not on file  . Years of education: Not on file  . Highest education level: Not on file  Occupational History  . Not on file  Tobacco Use  . Smoking status: Never  . Smokeless tobacco: Never  Vaping Use  . Vaping Use: Never used  Substance and Sexual Activity  . Alcohol use: No  . Drug use: No  . Sexual activity: Not on file  Other Topics Concern  . Not on file  Social History Narrative   Lives alone.  Moved from [place].     Caffeine- 6 cups daily, mix of caffeine/decaf   Children- 3   Retired Therapist, sports   Social Determinants of Radio broadcast assistant Strain: Not on [employer] Insecurity: Not on file  Transportation Needs: Not on file  Physical Activity: Not on file  Stress: Not on file  Social Connections: Not on file       Objective:   Physical Exam Vitals reviewed.  Constitutional:      General: She is irritable. She is not in acute distress.    Appearance: She is well-developed.     Comments: Malnutrition   HENT:     Head: Normocephalic and atraumatic.     Right Ear: There is impacted cerumen.     Left Ear: Tympanic membrane normal.   Eyes:     Pupils: Pupils are equal, round, and reactive to light.  Neck:     Thyroid: No thyromegaly.  Cardiovascular:     Rate and Rhythm: Normal rate and regular rhythm.     Heart sounds: Normal heart sounds. No murmur heard. Pulmonary:     Effort: Pulmonary effort is normal. No respiratory distress.     Breath sounds: Normal breath sounds. No wheezing.  Abdominal:     General: Bowel sounds are normal. There is no distension.     Palpations: Abdomen is soft.     Tenderness: There is no abdominal tenderness.  Musculoskeletal:        General: No tenderness.     Cervical back: Normal range of motion and neck supple.     Right lower leg: Edema (2+) present.     Left lower leg: Edema (2+) present.     Comments: Pain in neck with extension, pain in lumbar with ROM exercises   Skin:    General: Skin is  warm and dry.  Neurological:     Mental Status: She is alert and oriented to person, place, and time.     Cranial Nerves: No cranial nerve deficit.     Motor: Weakness present.     Gait: Gait abnormal (pt in wheelchair, unable to stand without help).     Deep Tendon Reflexes: Reflexes are normal and symmetric.  Psychiatric:        Behavior: Behavior normal.        Thought Content: Thought content normal.        Judgment: Judgment normal.      BP 120/61   Pulse 70   Temp 97.9 F (36.6 C) (Temporal)   Resp 20   SpO2 96%      Assessment & Plan:  Angela Wong comes in today with chief complaint of ER follow up   Diagnosis and orders addressed:  1. Hospital discharge follow-up - CMP14+EGFR - CBC with Differential/Platelet  2. Secondary hypertension - CMP14+EGFR - CBC with Differential/Platelet  3. Hypertensive heart disease with heart failure (HCC) BP is at goal, even though she stopped her medications.  - CMP14+EGFR - CBC with Differential/Platelet  4. Coronary artery disease involving native coronary artery of native heart with angina pectoris (HCC)  -  CMP14+EGFR - CBC with Differential/Platelet  5. Hypothyroidism, unspecified type - CMP14+EGFR - CBC with Differential/Platelet - TSH  6. Rheumatoid arthritis involving multiple sites with positive rheumatoid factor (HCC) - CMP14+EGFR - CBC with Differential/Platelet  7. Osteoarthritis, unspecified osteoarthritis type, unspecified site - CMP14+EGFR - CBC with Differential/Platelet  8. Malaise and fatigue - CMP14+EGFR - CBC with Differential/Platelet  9. Severe protein-calorie malnutrition ([place]) - CMP14+EGFR - CBC with Differential/Platelet  10. Dysuria - CMP14+EGFR - CBC with Differential/Platelet - Urinalysis, Complete - Urine Culture  11. Multiple allergies Pt states she has multiple allergies and can not take most of her medications or eat most foods because of allergic reaction. I am unsure if this is a true reaction or more psychological     Labs pending Health Maintenance reviewed Diet and exercise encouraged  Follow up plan: 3 months and keep appointment with specialists    [name], FNP

## 2020-09-29 NOTE — Addendum Note (Signed)
Addended by: Michaela Corner on: 09/29/2020 04:12 PM   Modules accepted: Orders

## 2020-09-29 NOTE — Telephone Encounter (Signed)
Patient wants to know if INR can be added to labs drawn today

## 2020-09-29 NOTE — Telephone Encounter (Signed)
Ok to add

## 2020-09-30 LAB — CBC WITH DIFFERENTIAL/PLATELET
Basophils Absolute: 0.1 10*3/uL (ref 0.0–0.2)
Basos: 1 %
EOS (ABSOLUTE): 0 10*3/uL (ref 0.0–0.4)
Eos: 0 %
Hematocrit: 47.3 % — ABNORMAL HIGH (ref 34.0–46.6)
Hemoglobin: 15.3 g/dL (ref 11.1–15.9)
Immature Grans (Abs): 0 10*3/uL (ref 0.0–0.1)
Immature Granulocytes: 0 %
Lymphocytes Absolute: 0.8 10*3/uL (ref 0.7–3.1)
Lymphs: 9 %
MCH: 30.6 pg (ref 26.6–33.0)
MCHC: 32.3 g/dL (ref 31.5–35.7)
MCV: 95 fL (ref 79–97)
Monocytes Absolute: 0.8 10*3/uL (ref 0.1–0.9)
Monocytes: 9 %
Neutrophils Absolute: 7.9 10*3/uL — ABNORMAL HIGH (ref 1.4–7.0)
Neutrophils: 81 %
Platelets: 128 10*3/uL — ABNORMAL LOW (ref 150–450)
RBC: 5 x10E6/uL (ref 3.77–5.28)
RDW: 14.2 % (ref 11.7–15.4)
WBC: 9.7 10*3/uL (ref 3.4–10.8)

## 2020-09-30 LAB — CMP14+EGFR
ALT: 34 IU/L — ABNORMAL HIGH (ref 0–32)
AST: 32 IU/L (ref 0–40)
Albumin/Globulin Ratio: 1.9 (ref 1.2–2.2)
Albumin: 4 g/dL (ref 3.7–4.7)
Alkaline Phosphatase: 158 IU/L — ABNORMAL HIGH (ref 44–121)
BUN/Creatinine Ratio: 18 (ref 12–28)
BUN: 13 mg/dL (ref 8–27)
Bilirubin Total: 1.4 mg/dL — ABNORMAL HIGH (ref 0.0–1.2)
CO2: 22 mmol/L (ref 20–29)
Calcium: 9.2 mg/dL (ref 8.7–10.3)
Chloride: 99 mmol/L (ref 96–106)
Creatinine, Ser: 0.71 mg/dL (ref 0.57–1.00)
Globulin, Total: 2.1 g/dL (ref 1.5–4.5)
Glucose: 80 mg/dL (ref 65–99)
Potassium: 4.7 mmol/L (ref 3.5–5.2)
Sodium: 137 mmol/L (ref 134–144)
Total Protein: 6.1 g/dL (ref 6.0–8.5)
eGFR: 86 mL/min/{1.73_m2} (ref >59)

## 2020-09-30 LAB — TSH: TSH: 1.19 u[IU]/mL (ref 0.450–4.500)

## 2020-10-01 DIAGNOSIS — R001 Bradycardia, unspecified: Secondary | ICD-10-CM | POA: Diagnosis not present

## 2020-10-02 DIAGNOSIS — M0579 Rheumatoid arthritis with rheumatoid factor of multiple sites without organ or systems involvement: Secondary | ICD-10-CM | POA: Diagnosis not present

## 2020-10-02 DIAGNOSIS — J449 Chronic obstructive pulmonary disease, unspecified: Secondary | ICD-10-CM | POA: Diagnosis not present

## 2020-10-02 DIAGNOSIS — I5032 Chronic diastolic (congestive) heart failure: Secondary | ICD-10-CM | POA: Diagnosis not present

## 2020-10-02 DIAGNOSIS — M47816 Spondylosis without myelopathy or radiculopathy, lumbar region: Secondary | ICD-10-CM | POA: Diagnosis not present

## 2020-10-02 DIAGNOSIS — I110 Hypertensive heart disease with heart failure: Secondary | ICD-10-CM | POA: Diagnosis not present

## 2020-10-02 DIAGNOSIS — I480 Paroxysmal atrial fibrillation: Secondary | ICD-10-CM | POA: Diagnosis not present

## 2020-10-06 ENCOUNTER — Encounter: Payer: Self-pay | Admitting: Family Medicine

## 2020-10-06 ENCOUNTER — Ambulatory Visit (INDEPENDENT_AMBULATORY_CARE_PROVIDER_SITE_OTHER): Payer: Medicare Other | Admitting: Family Medicine

## 2020-10-06 DIAGNOSIS — N3001 Acute cystitis with hematuria: Secondary | ICD-10-CM | POA: Diagnosis not present

## 2020-10-06 LAB — URINE CULTURE

## 2020-10-06 MED ORDER — FOSFOMYCIN TROMETHAMINE 3 G PO PACK
3.0000 g | PACK | Freq: Once | ORAL | 0 refills | Status: AC
Start: 2020-10-06 — End: 2020-10-06

## 2020-10-06 NOTE — Progress Notes (Signed)
   Virtual Visit  Note Due to COVID-19 pandemic this visit was conducted virtually. This visit type was conducted due to national recommendations for restrictions regarding the COVID-19 Pandemic (e.g. social distancing, sheltering in place) in an effort to limit this patient's exposure and mitigate transmission in our community. All issues noted in this document were discussed and addressed.  A physical exam was not performed with this format.  I connected with Angela Wong on 10/06/20 at 1441 by telephone and verified that I am speaking with the correct person using two identifiers. Angela Wong is currently located at home and no one is currently with her during the visit. The provider, Gwenlyn Perking, FNP is located in their office at time of visit.  I discussed the limitations, risks, security and privacy concerns of performing an evaluation and management service by telephone and the availability of in person appointments. I also discussed with the patient that there may be a patient responsible charge related to this service. The patient expressed understanding and agreed to proceed.  CC: dysuria  History and Present Illness:  HPI Angela Wong was seen for a UTI on 09/29/20. Culture was positive for e. Coli. She was treated with a zpak. She did feels some improvement in her symptoms the first day or two of the abx, but then her symptoms started to worsen again. She reports dysuria, frequency, urgency, and nausea. She denies fever, vomiting, flank pain, or hematuria. She has a extensive allergy list, particularly to antibiotics.     ROS As per HPI.   Observations/Objective: Alert and oriented x 3. Able to speak in full sentences without difficulty.    Assessment and Plan: Angela Wong was seen today for dysuria.  Diagnoses and all orders for this visit:  Acute cystitis with hematuria Culture + for e.coli on 09/29/20. Treated with Zpak without improvement. Fosfomycin ordered based on  culture results and patient's extensive allergy list. Discussed return precautions.  -     fosfomycin (MONUROL) 3 g PACK; Take 3 g by mouth once for 1 dose.    Follow Up Instructions: Return to office for new or worsening symptoms, or if symptoms persist.     I discussed the assessment and treatment plan with the patient. The patient was provided an opportunity to ask questions and all were answered. The patient agreed with the plan and demonstrated an understanding of the instructions.   The patient was advised to call back or seek an in-person evaluation if the symptoms worsen or if the condition fails to improve as anticipated.  The above assessment and management plan was discussed with the patient. The patient verbalized understanding of and has agreed to the management plan. Patient is aware to call the clinic if symptoms persist or worsen. Patient is aware when to return to the clinic for a follow-up visit. Patient educated on when it is appropriate to go to the emergency department.   Time call ended:  1455  I provided 14 minutes of  non face-to-face time during this encounter.    Gwenlyn Perking, FNP

## 2020-10-07 ENCOUNTER — Encounter (HOSPITAL_COMMUNITY): Payer: Self-pay | Admitting: Emergency Medicine

## 2020-10-07 ENCOUNTER — Other Ambulatory Visit: Payer: Self-pay

## 2020-10-07 ENCOUNTER — Emergency Department (HOSPITAL_COMMUNITY)
Admission: EM | Admit: 2020-10-07 | Discharge: 2020-10-07 | Disposition: A | Discharge status: Home / Self Care | Payer: Medicare Other | Attending: Emergency Medicine | Admitting: Emergency Medicine

## 2020-10-07 DIAGNOSIS — N3000 Acute cystitis without hematuria: Secondary | ICD-10-CM | POA: Insufficient documentation

## 2020-10-07 DIAGNOSIS — I110 Hypertensive heart disease with heart failure: Secondary | ICD-10-CM | POA: Diagnosis not present

## 2020-10-07 DIAGNOSIS — Z79899 Other long term (current) drug therapy: Secondary | ICD-10-CM | POA: Insufficient documentation

## 2020-10-07 DIAGNOSIS — Z955 Presence of coronary angioplasty implant and graft: Secondary | ICD-10-CM | POA: Insufficient documentation

## 2020-10-07 DIAGNOSIS — I4891 Unspecified atrial fibrillation: Secondary | ICD-10-CM | POA: Diagnosis not present

## 2020-10-07 DIAGNOSIS — I509 Heart failure, unspecified: Secondary | ICD-10-CM | POA: Insufficient documentation

## 2020-10-07 DIAGNOSIS — I2510 Atherosclerotic heart disease of native coronary artery without angina pectoris: Secondary | ICD-10-CM | POA: Insufficient documentation

## 2020-10-07 DIAGNOSIS — R5381 Other malaise: Secondary | ICD-10-CM | POA: Diagnosis not present

## 2020-10-07 DIAGNOSIS — E039 Hypothyroidism, unspecified: Secondary | ICD-10-CM | POA: Diagnosis not present

## 2020-10-07 DIAGNOSIS — J4540 Moderate persistent asthma, uncomplicated: Secondary | ICD-10-CM | POA: Diagnosis not present

## 2020-10-07 DIAGNOSIS — Z7901 Long term (current) use of anticoagulants: Secondary | ICD-10-CM | POA: Diagnosis not present

## 2020-10-07 DIAGNOSIS — R300 Dysuria: Secondary | ICD-10-CM | POA: Diagnosis present

## 2020-10-07 DIAGNOSIS — J449 Chronic obstructive pulmonary disease, unspecified: Secondary | ICD-10-CM | POA: Insufficient documentation

## 2020-10-07 DIAGNOSIS — Z8543 Personal history of malignant neoplasm of ovary: Secondary | ICD-10-CM | POA: Insufficient documentation

## 2020-10-07 DIAGNOSIS — N390 Urinary tract infection, site not specified: Secondary | ICD-10-CM | POA: Diagnosis not present

## 2020-10-07 LAB — URINALYSIS, ROUTINE W REFLEX MICROSCOPIC
Bilirubin Urine: NEGATIVE
Glucose, UA: NEGATIVE mg/dL
Ketones, ur: NEGATIVE mg/dL
Nitrite: POSITIVE — AB
Protein, ur: NEGATIVE mg/dL
Specific Gravity, Urine: 1.006 (ref 1.005–1.030)
pH: 6 (ref 5.0–8.0)

## 2020-10-07 MED ORDER — FOSFOMYCIN TROMETHAMINE 3 G PO PACK
3.0000 g | PACK | Freq: Once | ORAL | Status: AC
  Administered 2020-10-07: 3 g via ORAL
  Filled 2020-10-07: qty 3

## 2020-10-07 MED ORDER — OXYCODONE-ACETAMINOPHEN 5-325 MG PO TABS
1.5000 | ORAL_TABLET | Freq: Once | ORAL | Status: AC
  Administered 2020-10-07: 1.5 via ORAL
  Filled 2020-10-07: qty 2

## 2020-10-07 MED ORDER — METHOCARBAMOL 500 MG PO TABS
750.0000 mg | ORAL_TABLET | Freq: Once | ORAL | Status: AC
  Administered 2020-10-07: 750 mg via ORAL
  Filled 2020-10-07: qty 2

## 2020-10-07 NOTE — ED Triage Notes (Signed)
PT arrive [facility] c/o of UTI symptoms since 09/29/20. Pt states just completed zithromycin.

## 2020-10-07 NOTE — ED Notes (Signed)
Pt states "I'm having light abdominal cramping"  Pt wants to try eating some crackers

## 2020-10-07 NOTE — Discharge Instructions (Addendum)
You have been treated with this one-time dose of fosfomycin for your urinary infection and it appears you tolerated this well.  I suspect if you were going to have an allergic reaction you would have already.  Plan follow-up with your primary provider for any persistent or worsening urinary symptoms.

## 2020-10-07 NOTE — ED Notes (Signed)
Called pt son to pick up. Son states will be 35-40 min until his arrival

## 2020-10-07 NOTE — ED Provider Notes (Signed)
[facility] EMERGENCY DEPARTMENT Provider Note   CSN: [id number] Arrival date & time: 10/07/20  1252     History Chief Complaint  Patient presents with   Urinary Tract Infection    Angela Wong is a 80 y.o. female with a history of COPD, GERD, IBS, Sjogrens disease on chronic prednisone and multiple antibiotic allergies presenting with dysuria.  She was seen by her pcp last week and was given a zpack for uti, lab results including urine culture completed.  She felt better for a few days, but her symptoms started back including painful and frequent urination.  She did have gross blood in her urine but this has resolved since taking the zithromax.  She denies fevers, chills, n/v, abdomen or back pain.  Her PCP called a one time dose of fosfomycin yesterday but she did not pick up after talking to the pharmacist as she was afraid to be home (lives alone) if she started to have an allergic reaction after taking this medicine.   The history is provided by the patient.      Past Medical History:  Diagnosis Date   A-fib ([place])    Asthma    Cerebral vasculitis    Congestive dilated cardiomyopathy (HCC)    COPD (chronic obstructive pulmonary disease) (HCC)    Fibromyalgia    Gastric polyp    GERD (gastroesophageal reflux disease)    Hiatal hernia    Hyperparathyroidism (HCC)    IBS (irritable bowel syndrome)    MVP (mitral valve prolapse)    Osteoarthritis    Ovarian cancer (HCC)    lymph node removal with hysterectomy   Raynaud's disease    RLS (restless legs syndrome)    Situational depression    Sjogren's syndrome ([place])    Vasculitis ([place])    Vitamin D deficiency     Patient Active Problem List   Diagnosis Date Noted   Multiple allergies 09/29/2020   Pacemaker 07/22/2019   Drug intolerance 07/22/2019   Edema of both lower extremities 07/22/2019   Subtherapeutic international normalized ratio (INR) 07/05/2019   CAD (coronary artery disease) 07/05/2019   Non-STEMI  (non-ST elevated myocardial infarction) ([place]) 07/01/2019   Chronic constipation 06/29/2019   Hypertensive heart disease with heart failure ([place]) 06/29/2019   Decompensated heart failure ([place]) 06/22/2019   Pressure injury of skin 06/22/2019   RA (rheumatoid arthritis) ([place]) 05/04/2019   Anticoagulation goal of INR 2 to 3 12/18/2017   Depression, major, single episode, moderate ([place]) 09/12/2017   Chest pain 08/25/2017   Generalized weakness 08/25/2017   Seasonal and perennial allergic rhinitis 12/31/2016   Allergic rhinitis with a nonallergic component 10/01/2016   Itching 10/01/2016   Skeeter syndrome 10/01/2016   Steroid-induced osteopenia 07/11/2016   Hyperparathyroidism ([place]) 01/16/2016   Iron deficiency anemia 12/18/2015   Insomnia 12/18/2015   Hypothyroidism 12/18/2015   Rotator cuff tear 03/23/2014   Osteoarthritis    Moderate persistent asthma without complication    Ovarian cancer ([place])    Fibromyalgia    RLS (restless legs syndrome)    Cerebral vasculitis    Sjogren's syndrome ([place])    Raynaud's disease    MVP (mitral valve prolapse)    IBS (irritable bowel syndrome)    Congestive dilated cardiomyopathy ([place]) 02/09/2014   Dyspnea 12/13/2013   Dyslipidemia 11/26/2013   Malaise and fatigue 11/26/2013   Allergy to multiple antibiotics 11/02/2013   History of ovarian cancer 10/01/2013   GERD (gastroesophageal reflux disease) 10/01/2013   Hypertension 10/01/2013  Atrial fibrillation, permanent ([place]) 07/27/2013    Past Surgical History:  Procedure Laterality Date   ABDOMINAL HYSTERECTOMY  1982   with right oophorectomy   APPENDECTOMY     BREAST BIOPSY Right    x 2   BREAST SURGERY     Biopsy   CARPAL TUNNEL RELEASE Right 1980   CARPAL TUNNEL RELEASE Left 2010   x 2   CATARACT EXTRACTION Bilateral    CESAREAN SECTION     x 3   KNEE SURGERY Left 2005   LEFT HEART CATH AND CORONARY ANGIOGRAPHY N/A 07/02/2019   Procedure: LEFT HEART CATH AND CORONARY  ANGIOGRAPHY;  Surgeon: Jettie Booze, MD;  Location: [facility];  Service: Cardiovascular;  Laterality: N/A;   OOPHORECTOMY Left [place]  09/15/2014   REFRACTIVE SURGERY Bilateral 2014   TUBAL LIGATION       OB History     Gravida      Para      Term      Preterm      AB      Living  3      SAB      IAB      Ectopic      Multiple      Live Births              Family History  Problem Relation Age of Onset   COPD Mother    Breast cancer Mother    Allergic rhinitis Mother    Heart disease Father        No details   Kidney disease Father    Allergic rhinitis Father    Stroke Sister    Arthritis/Rheumatoid Sister    Asthma Sister    Lupus Sister    Heart attack Sister    Stroke Paternal Grandmother    Scleroderma Grandchild    Thyroid disease Other    Breast cancer Maternal Aunt        x 2    Social History   Tobacco Use   Smoking status: Never   Smokeless tobacco: Never  Vaping Use   Vaping Use: Never used  Substance Use Topics   Alcohol use: No   Drug use: No    Home Medications Prior to Admission medications   Medication Sig Start Date End Date Taking? Authorizing Provider  albuterol (VENTOLIN HFA) 108 (90 Base) MCG/ACT inhaler INHALE 2 PUFFS BY MOUTH EVERY 6 HOURS AS NEEDED FOR WHEEZING 08/10/18  Yes Terald Sleeper, PA-C  atenolol (TENORMIN) 25 MG tablet Take 0.5 tablets (12.5 mg total) by mouth 2 (two) times daily. 07/05/19  Yes Furth, Cadence H, PA-C  fexofenadine (ALLEGRA) 180 MG tablet Take 180 mg by mouth every morning.    Yes [provider]  hydrocortisone-pramoxine [facility]) 2.5-1 % rectal cream Place 1 application rectally See admin instructions. After each bathroom use 09/08/20  Yes [provider]  levothyroxine (EUTHYROX) 50 MCG tablet Take 2 tablets (100 mcg total) by mouth every morning. 08/05/19  Yes Hawks, Christy A, FNP  methocarbamol (ROBAXIN) 750 MG tablet Take 750 mg  by mouth 4 (four) times daily.   Yes [provider]  oxyCODONE-acetaminophen (PERCOCET) 7.5-325 MG tablet Take 1 tablet by mouth every 6 (six) hours as needed for severe pain.   Yes [provider]  predniSONE (DELTASONE) 5 MG tablet Take 5 mg by mouth every morning.   Yes [provider]  RESTASIS 0.05 %  ophthalmic emulsion INSTILL 1 DROP INTO EACH EYE TWICE DAILY Patient taking differently: Place 1 drop into both eyes 2 (two) times daily. 11/12/19  Yes Hawks, Christy A, FNP  azithromycin (ZITHROMAX) 250 MG tablet Take 500 mg once, then 250 mg for four days Patient not taking: Reported on 10/07/2020 09/29/20   [name], FNP  methocarbamol (ROBAXIN) 500 MG tablet Take 1 tablet (500 mg total) by mouth 3 (three) times daily. Patient not taking: No sig reported 06/24/19   Heath Lark D, DO  warfarin (COUMADIN) 1 MG tablet TAKE 4MG  MONDAY, WEDNESDAY, AND FRIDAY. TAKE 2 MG TUESDAY, THURSDAY, SATURDAY, AND SUNDAY 06/27/20   [name] A, FNP    Allergies    Cephalosporins, Ciprofloxacin, Diltiazem, Doxycycline, Horse-derived products, Ketek [telithromycin], Nitrofuran derivatives, Nitrous oxide, Other, Penicillins, Pentazocine, Sulfa antibiotics, Trovan [alatrofloxacin], Vitamin b12, Zinc gelatin [zinc], Amlodipine, Calcium channel blockers, Carvedilol, Clindamycin/lincomycin, Codeine, Cymbalta [duloxetine hcl], Diovan [valsartan], Lisinopril, Sertraline, Tramadol, and Loteprednol etabonate  Review of Systems   Review of Systems  Constitutional:  Negative for chills and fever.  HENT:  Negative for congestion and sore throat.   Eyes: Negative.   Respiratory:  Negative for chest tightness and shortness of breath.   Cardiovascular:  Negative for chest pain.  Gastrointestinal:  Negative for abdominal pain, nausea and vomiting.  Genitourinary:  Positive for dysuria, frequency and urgency. Negative for hematuria.  Musculoskeletal:  Negative for arthralgias, joint  swelling and neck pain.  Skin: Negative.  Negative for rash and wound.  Neurological:  Negative for dizziness, weakness, light-headedness, numbness and headaches.  Psychiatric/Behavioral: Negative.    All other systems reviewed and are negative.  Physical Exam Updated Vital Signs BP 125/77   Pulse 69   Temp 98.3 F (36.8 C) (Oral)   Resp 14   Wt 53.1 kg Comment: bed scale  SpO2 97%   BMI 20.08 kg/m   Physical Exam Vitals and nursing note reviewed.  Constitutional:      Appearance: She is well-developed.  HENT:     Head: Normocephalic and atraumatic.  Eyes:     Conjunctiva/sclera: Conjunctivae normal.  Cardiovascular:     Rate and Rhythm: Normal rate and regular rhythm.     Heart sounds: Normal heart sounds.  Pulmonary:     Effort: Pulmonary effort is normal.     Breath sounds: Normal breath sounds. No wheezing.  Abdominal:     General: Bowel sounds are normal.     Palpations: Abdomen is soft.     Tenderness: There is no abdominal tenderness. There is no guarding.     Comments: No localizing back pain.  Musculoskeletal:        General: Normal range of motion.     Cervical back: Normal range of motion.  Skin:    General: Skin is warm and dry.  Neurological:     Mental Status: She is alert.    ED Results / Procedures / Treatments   Labs (all labs ordered are listed, but only abnormal results are displayed) Labs Reviewed  URINALYSIS, ROUTINE W REFLEX MICROSCOPIC - Abnormal; Notable for the following components:      Result Value   Hgb urine dipstick SMALL (*)    Nitrite POSITIVE (*)    Leukocytes,Ua LARGE (*)    Bacteria, UA RARE (*)    All other components within normal limits    EKG None  Radiology No results found.  Procedures Procedures   Medications Ordered in ED Medications  fosfomycin (MONUROL) packet 3  g (3 g Oral Given 10/07/20 1426)    ED Course  I have reviewed the triage vital signs and the nursing notes.  Pertinent labs & imaging  results that were available during my care of the patient were reviewed by me and considered in my medical decision making (see chart for details).    MDM Rules/Calculators/A&P                           Discussed pt's sx, urine cx results and allergies with pharmacy - concurs that fosfomycin would be a good choice .  We will give her this here and observe her for several hours prior to dc to ensure no allergic reaction.   5:25 PM Pt observed in ed for 3 hours after fosfomycin given with no signs of allergic reaction.  Dc'd home with plans for f/u care with pcp for any persistent urinary complaints.  Final Clinical Impression(s) / ED Diagnoses Final diagnoses:  Acute cystitis without hematuria    Rx / DC Orders ED Discharge Orders     None        Landis Martins 10/07/20 1726    Isla Pence, MD 10/08/20 [phone]

## 2020-10-08 DIAGNOSIS — J449 Chronic obstructive pulmonary disease, unspecified: Secondary | ICD-10-CM | POA: Diagnosis not present

## 2020-10-08 DIAGNOSIS — M0579 Rheumatoid arthritis with rheumatoid factor of multiple sites without organ or systems involvement: Secondary | ICD-10-CM | POA: Diagnosis not present

## 2020-10-08 DIAGNOSIS — Z8543 Personal history of malignant neoplasm of ovary: Secondary | ICD-10-CM | POA: Diagnosis not present

## 2020-10-08 DIAGNOSIS — E785 Hyperlipidemia, unspecified: Secondary | ICD-10-CM | POA: Diagnosis not present

## 2020-10-08 DIAGNOSIS — I480 Paroxysmal atrial fibrillation: Secondary | ICD-10-CM | POA: Diagnosis not present

## 2020-10-08 DIAGNOSIS — G4700 Insomnia, unspecified: Secondary | ICD-10-CM | POA: Diagnosis not present

## 2020-10-08 DIAGNOSIS — E559 Vitamin D deficiency, unspecified: Secondary | ICD-10-CM | POA: Diagnosis not present

## 2020-10-08 DIAGNOSIS — F321 Major depressive disorder, single episode, moderate: Secondary | ICD-10-CM | POA: Diagnosis not present

## 2020-10-08 DIAGNOSIS — Z7952 Long term (current) use of systemic steroids: Secondary | ICD-10-CM | POA: Diagnosis not present

## 2020-10-08 DIAGNOSIS — I2510 Atherosclerotic heart disease of native coronary artery without angina pectoris: Secondary | ICD-10-CM | POA: Diagnosis not present

## 2020-10-08 DIAGNOSIS — I252 Old myocardial infarction: Secondary | ICD-10-CM | POA: Diagnosis not present

## 2020-10-08 DIAGNOSIS — J4540 Moderate persistent asthma, uncomplicated: Secondary | ICD-10-CM | POA: Diagnosis not present

## 2020-10-08 DIAGNOSIS — I5032 Chronic diastolic (congestive) heart failure: Secondary | ICD-10-CM | POA: Diagnosis not present

## 2020-10-08 DIAGNOSIS — E213 Hyperparathyroidism, unspecified: Secondary | ICD-10-CM | POA: Diagnosis not present

## 2020-10-08 DIAGNOSIS — I7300 Raynaud's syndrome without gangrene: Secondary | ICD-10-CM | POA: Diagnosis not present

## 2020-10-08 DIAGNOSIS — Z7901 Long term (current) use of anticoagulants: Secondary | ICD-10-CM | POA: Diagnosis not present

## 2020-10-08 DIAGNOSIS — I110 Hypertensive heart disease with heart failure: Secondary | ICD-10-CM | POA: Diagnosis not present

## 2020-10-08 DIAGNOSIS — M47816 Spondylosis without myelopathy or radiculopathy, lumbar region: Secondary | ICD-10-CM | POA: Diagnosis not present

## 2020-10-08 DIAGNOSIS — D509 Iron deficiency anemia, unspecified: Secondary | ICD-10-CM | POA: Diagnosis not present

## 2020-10-08 DIAGNOSIS — M3500 Sicca syndrome, unspecified: Secondary | ICD-10-CM | POA: Diagnosis not present

## 2020-10-08 DIAGNOSIS — K219 Gastro-esophageal reflux disease without esophagitis: Secondary | ICD-10-CM | POA: Diagnosis not present

## 2020-10-08 DIAGNOSIS — I420 Dilated cardiomyopathy: Secondary | ICD-10-CM | POA: Diagnosis not present

## 2020-10-08 DIAGNOSIS — M797 Fibromyalgia: Secondary | ICD-10-CM | POA: Diagnosis not present

## 2020-10-09 ENCOUNTER — Telehealth: Payer: Self-pay | Admitting: *Deleted

## 2020-10-09 DIAGNOSIS — M0579 Rheumatoid arthritis with rheumatoid factor of multiple sites without organ or systems involvement: Secondary | ICD-10-CM | POA: Diagnosis not present

## 2020-10-09 DIAGNOSIS — J449 Chronic obstructive pulmonary disease, unspecified: Secondary | ICD-10-CM | POA: Diagnosis not present

## 2020-10-09 DIAGNOSIS — I5032 Chronic diastolic (congestive) heart failure: Secondary | ICD-10-CM | POA: Diagnosis not present

## 2020-10-09 DIAGNOSIS — I110 Hypertensive heart disease with heart failure: Secondary | ICD-10-CM | POA: Diagnosis not present

## 2020-10-09 DIAGNOSIS — I480 Paroxysmal atrial fibrillation: Secondary | ICD-10-CM | POA: Diagnosis not present

## 2020-10-09 DIAGNOSIS — M47816 Spondylosis without myelopathy or radiculopathy, lumbar region: Secondary | ICD-10-CM | POA: Diagnosis not present

## 2020-10-09 NOTE — Telephone Encounter (Signed)
Reached out to patient to follow up on [employer] message.  Patient states she seems to be doing a little better today in regards to UTI  symptoms.   She is asking if [name], FNP would be willing to take over her prednisone script.  She says the her Rheumatoid clinic is unwilling to continue scripts without a visit.  She states they are unwilling to help her into the building, ie...wheelchair.  They suggested that she seek out her PCP to take over prednisone script.

## 2020-10-09 NOTE — Telephone Encounter (Signed)
Look like patient was seen in the ED on 10/07/20 and given the fosfomycin for treatment while in the ED. This should be adequate treatment for her UTI and no further abx of needed at this time. Follow up if symptoms do not improve.

## 2020-10-09 NOTE — Telephone Encounter (Signed)
Fax from [place]: Fosfomycin written on 10/06/20 Not covered by insurance Prefers Augmentin, Ciprofloxacin or Levofloxacin Please advise

## 2020-10-10 MED ORDER — PREDNISONE 5 MG PO TABS: 5.0000 mg | ORAL_TABLET | Freq: Every morning | ORAL | 1 refills | Status: DC

## 2020-10-10 NOTE — Telephone Encounter (Signed)
I have sent a prescription of prednisone to your pharmacy. Keep follow up with me.

## 2020-10-10 NOTE — Telephone Encounter (Signed)
Patient aware and verbalized understanding. °

## 2020-10-12 ENCOUNTER — Ambulatory Visit: Payer: Self-pay | Admitting: *Deleted

## 2020-10-12 NOTE — Telephone Encounter (Signed)
Pt called in on the community line.   See triage notes for all the conversation and details.

## 2020-10-12 NOTE — Telephone Encounter (Signed)
Reason for Disposition  [1] MILD-MODERATE pain AND [2] comes and goes (cramps)  Answer Assessment - Initial Assessment Questions 1. LOCATION: "Where does it hurt?"      Pt calling in.   She is having abd pain.   My cell phone doesn't work.   If you send me a form I'll fill it out.  The medicine helped.  When I came to [facility] they gave me a medication that gave me stomach cramping and a headache.  When I saw Marny Lowenstein on 9/16 I was having blood in my urine.   I had a infection.   They called in a brand new antibiotic.   The pharmacist did not feel it was safe for me to go home and take it.   I had to go to the ED and have it administered  I  had a reaction.   I got abd cramping and a headache. I went home.   But I'm still having abd pain.   I'm now having diarrhea.    I don't know if this problem is from the medication or my diet.   When I got home the next day I started having the same symptoms urgency and frequency of urination.   I eat a lot of fruits and vegetable.  My stomach cramping is more and I'm having looser stools.   I don't know if it's my diet or the medicine.  Also a headache.     [name], FNP called in more prednisone for me.   I have a lot of autoimmune disease and I have diverticulosis and a hiatal hernia.   I also have horrible joint pain.   There is severe bone and joint destruction all over my body.    I have a new message in my chart and it had this number.   So that's why I'm calling you.   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   I gave her the Office Patient Experience.   I'm not ambulatory.  I use a wheelchair.   [name] will want me to come in and it's difficult.      I suggested Pepto Bismol and Imodium since she was wanting a recommendation for what to do for the diarrhea and stomach cramping.    She will see if someone will pick some up for her.   She mentioned that her cell phone is not working and her other phone won't work either once  the rain starts tomorrow.   I asked if she had someone to pick it up for her or does her pharmacy deliver.   Her pharmacy does not deliver and she's going to see if someone can pick up some Pepto Bismol or Imodium for her.  I suggested she call her PCP [name], FNP and see what she would suggest since she is familiar with all the the issues this pt has.   Pt said,  "ok".    That ended our call.  2. RADIATION: "Does the pain shoot anywhere else?" (e.g., chest, back)     *No Answer* 3. ONSET: "When did the pain begin?" (e.g., minutes, hours or days ago)      *No Answer* 4. SUDDEN: "Gradual or sudden onset?"     *No Answer* 5. PATTERN "Does the pain come and go, or is it constant?"    - If constant: "Is it getting better, staying the same, or worsening?"      (  Note: Constant means the pain never goes away completely; most serious pain is constant and it progresses)     - If intermittent: "How long does it last?" "Do you have pain now?"     (Note: Intermittent means the pain goes away completely between bouts)     *No Answer* 6. SEVERITY: "How bad is the pain?"  (e.g., Scale 1-10; mild, moderate, or severe)   - MILD (1-3): doesn't interfere with normal activities, abdomen soft and not tender to touch    - MODERATE (4-7): interferes with normal activities or awakens from sleep, abdomen tender to touch    - SEVERE (8-10): excruciating pain, doubled over, unable to do any normal activities      *No Answer* 7. RECURRENT SYMPTOM: "Have you ever had this type of stomach pain before?" If Yes, ask: "When was the last time?" and "What happened that time?"      *No Answer* 8. CAUSE: "What do you think is causing the stomach pain?"     *No Answer* 9. RELIEVING/AGGRAVATING FACTORS: "What makes it better or worse?" (e.g., movement, antacids, bowel movement)     *No Answer* 10. OTHER SYMPTOMS: "Do you have any other symptoms?" (e.g., back pain, diarrhea, fever, urination pain, vomiting)       *No  Answer* 11. PREGNANCY: "Is there any chance you are pregnant?" "When was your last menstrual period?"       *No Answer*  Protocols used: Abdominal Pain - [facility]

## 2020-10-13 DIAGNOSIS — I4821 Permanent atrial fibrillation: Secondary | ICD-10-CM | POA: Diagnosis not present

## 2020-10-16 ENCOUNTER — Ambulatory Visit (INDEPENDENT_AMBULATORY_CARE_PROVIDER_SITE_OTHER): Payer: Medicare Other | Admitting: Nurse Practitioner

## 2020-10-16 DIAGNOSIS — R300 Dysuria: Secondary | ICD-10-CM

## 2020-10-16 MED ORDER — AZITHROMYCIN 250 MG PO TABS: ORAL_TABLET | ORAL | 0 refills | Status: DC

## 2020-10-16 NOTE — Progress Notes (Addendum)
Virtual Visit  Note Due to COVID-19 pandemic this visit was conducted virtually. This visit type was conducted due to national recommendations for restrictions regarding the COVID-19 Pandemic (e.g. social distancing, sheltering in place) in an effort to limit this patient's exposure and mitigate transmission in our community. All issues noted in this document were discussed and addressed.  A physical exam was not performed with this format.  I connected with Angela Wong on 10/16/20 at 2:04 by telephone and verified that I am speaking with the correct person using two identifiers. Angela Wong is currently located at home and 2 aids are is currently with her during visit. The provider, Mary-Margaret Hassell Done, FNP is located in their office at time of visit.  I discussed the limitations, risks, security and privacy concerns of performing an evaluation and management service by telephone and the availability of in person appointments. I also discussed with the patient that there may be a patient responsible charge related to this service. The patient expressed understanding and agreed to proceed.   History and Present Illness:  Patient states that she had UTI on 10/06/20 and did telephone visit with t. MOrgan,FNP and was given fosfomycin. She had to go to the ED the next day with chest pain and she is not sure if they checked urine there or not. Looking at ED note her urine was positive for nitrites. They did not do anything about urine.They were concerned about her heart. They decided she was fine and discharged her home. Now she says she is still having. No culture was done on urine. She is still having urgency and frequency with scant amounts of urine. She is allergic to everything, but can tolerate zithromax.    Review of Systems  Genitourinary:  Positive for dysuria, frequency and urgency.    Observations/Objective: Alert and oriented- answers all questions appropriately No  distress   Assessment and Plan: Angela Wong in today with chief complaint of No chief complaint on file.   1. Dysuria Take medication as prescribe Cotton underwear Take shower not bath Cranberry juice, yogurt Force fluids RTO prn Will try zithromax and see if helps since that is all she can tolerate  Meds ordered this encounter  Medications   azithromycin (ZITHROMAX Z-PAK) 250 MG tablet    Sig: As directed    Dispense:  6 tablet    Refill:  0    Order Specific Question:   Supervising Provider    Answer:   Worthy Rancher [id number]   Records for last 3 visits as well as test results had to be reviewed   Follow Up Instructions: prn    I discussed the assessment and treatment plan with the patient. The patient was provided an opportunity to ask questions and all were answered. The patient agreed with the plan and demonstrated an understanding of the instructions.   The patient was advised to call back or seek an in-person evaluation if the symptoms worsen or if the condition fails to improve as anticipated.  The above assessment and management plan was discussed with the patient. The patient verbalized understanding of and has agreed to the management plan. Patient is aware to call the clinic if symptoms persist or worsen. Patient is aware when to return to the clinic for a follow-up visit. Patient educated on when it is appropriate to go to the emergency department.   Time call ended:  2;21  I provided 16 minutes of  non face-to-face time during  this encounter.    Mary-Margaret Hassell Done, FNP

## 2020-10-17 ENCOUNTER — Emergency Department (HOSPITAL_COMMUNITY): Payer: Medicare Other

## 2020-10-17 ENCOUNTER — Encounter (HOSPITAL_COMMUNITY): Payer: Self-pay | Admitting: Emergency Medicine

## 2020-10-17 ENCOUNTER — Inpatient Hospital Stay (HOSPITAL_COMMUNITY)
Admission: EM | Admit: 2020-10-17 | Discharge: 2020-10-28 | DRG: 417 | Disposition: A | Discharge status: Skilled Nursing Facility | Payer: Medicare Other | Attending: Internal Medicine | Admitting: Internal Medicine

## 2020-10-17 ENCOUNTER — Other Ambulatory Visit: Payer: Self-pay

## 2020-10-17 DIAGNOSIS — I341 Nonrheumatic mitral (valve) prolapse: Secondary | ICD-10-CM | POA: Diagnosis present

## 2020-10-17 DIAGNOSIS — M40209 Unspecified kyphosis, site unspecified: Secondary | ICD-10-CM | POA: Diagnosis present

## 2020-10-17 DIAGNOSIS — K219 Gastro-esophageal reflux disease without esophagitis: Secondary | ICD-10-CM | POA: Diagnosis present

## 2020-10-17 DIAGNOSIS — Z7401 Bed confinement status: Secondary | ICD-10-CM | POA: Diagnosis not present

## 2020-10-17 DIAGNOSIS — R7989 Other specified abnormal findings of blood chemistry: Secondary | ICD-10-CM | POA: Insufficient documentation

## 2020-10-17 DIAGNOSIS — R7881 Bacteremia: Secondary | ICD-10-CM | POA: Diagnosis present

## 2020-10-17 DIAGNOSIS — K8001 Calculus of gallbladder with acute cholecystitis with obstruction: Secondary | ICD-10-CM | POA: Diagnosis not present

## 2020-10-17 DIAGNOSIS — T7840XD Allergy, unspecified, subsequent encounter: Secondary | ICD-10-CM | POA: Diagnosis not present

## 2020-10-17 DIAGNOSIS — R262 Difficulty in walking, not elsewhere classified: Secondary | ICD-10-CM | POA: Diagnosis present

## 2020-10-17 DIAGNOSIS — E213 Hyperparathyroidism, unspecified: Secondary | ICD-10-CM | POA: Diagnosis present

## 2020-10-17 DIAGNOSIS — K8010 Calculus of gallbladder with chronic cholecystitis without obstruction: Secondary | ICD-10-CM | POA: Diagnosis not present

## 2020-10-17 DIAGNOSIS — I959 Hypotension, unspecified: Secondary | ICD-10-CM | POA: Diagnosis not present

## 2020-10-17 DIAGNOSIS — Z9851 Tubal ligation status: Secondary | ICD-10-CM

## 2020-10-17 DIAGNOSIS — I7300 Raynaud's syndrome without gangrene: Secondary | ICD-10-CM | POA: Diagnosis present

## 2020-10-17 DIAGNOSIS — Z20822 Contact with and (suspected) exposure to covid-19: Secondary | ICD-10-CM | POA: Diagnosis present

## 2020-10-17 DIAGNOSIS — M797 Fibromyalgia: Secondary | ICD-10-CM | POA: Diagnosis present

## 2020-10-17 DIAGNOSIS — R531 Weakness: Secondary | ICD-10-CM | POA: Diagnosis present

## 2020-10-17 DIAGNOSIS — Z8249 Family history of ischemic heart disease and other diseases of the circulatory system: Secondary | ICD-10-CM

## 2020-10-17 DIAGNOSIS — Z950 Presence of cardiac pacemaker: Secondary | ICD-10-CM

## 2020-10-17 DIAGNOSIS — Z888 Allergy status to other drugs, medicaments and biological substances status: Secondary | ICD-10-CM

## 2020-10-17 DIAGNOSIS — Z881 Allergy status to other antibiotic agents status: Secondary | ICD-10-CM

## 2020-10-17 DIAGNOSIS — Z8543 Personal history of malignant neoplasm of ovary: Secondary | ICD-10-CM

## 2020-10-17 DIAGNOSIS — G8929 Other chronic pain: Secondary | ICD-10-CM

## 2020-10-17 DIAGNOSIS — R52 Pain, unspecified: Secondary | ICD-10-CM

## 2020-10-17 DIAGNOSIS — Z90721 Acquired absence of ovaries, unilateral: Secondary | ICD-10-CM

## 2020-10-17 DIAGNOSIS — K449 Diaphragmatic hernia without obstruction or gangrene: Secondary | ICD-10-CM | POA: Diagnosis present

## 2020-10-17 DIAGNOSIS — R109 Unspecified abdominal pain: Secondary | ICD-10-CM | POA: Diagnosis not present

## 2020-10-17 DIAGNOSIS — Z79899 Other long term (current) drug therapy: Secondary | ICD-10-CM

## 2020-10-17 DIAGNOSIS — G2581 Restless legs syndrome: Secondary | ICD-10-CM | POA: Diagnosis present

## 2020-10-17 DIAGNOSIS — M4807 Spinal stenosis, lumbosacral region: Secondary | ICD-10-CM | POA: Diagnosis present

## 2020-10-17 DIAGNOSIS — Z79890 Hormone replacement therapy (postmenopausal): Secondary | ICD-10-CM

## 2020-10-17 DIAGNOSIS — R1314 Dysphagia, pharyngoesophageal phase: Secondary | ICD-10-CM | POA: Diagnosis not present

## 2020-10-17 DIAGNOSIS — I495 Sick sinus syndrome: Secondary | ICD-10-CM | POA: Diagnosis present

## 2020-10-17 DIAGNOSIS — M4987 Spondylopathy in diseases classified elsewhere, lumbosacral region: Secondary | ICD-10-CM | POA: Diagnosis present

## 2020-10-17 DIAGNOSIS — M069 Rheumatoid arthritis, unspecified: Secondary | ICD-10-CM | POA: Diagnosis present

## 2020-10-17 DIAGNOSIS — I480 Paroxysmal atrial fibrillation: Secondary | ICD-10-CM | POA: Diagnosis present

## 2020-10-17 DIAGNOSIS — B9620 Unspecified Escherichia coli [E. coli] as the cause of diseases classified elsewhere: Secondary | ICD-10-CM | POA: Diagnosis present

## 2020-10-17 DIAGNOSIS — R1084 Generalized abdominal pain: Secondary | ICD-10-CM | POA: Diagnosis not present

## 2020-10-17 DIAGNOSIS — J4540 Moderate persistent asthma, uncomplicated: Secondary | ICD-10-CM | POA: Diagnosis present

## 2020-10-17 DIAGNOSIS — E039 Hypothyroidism, unspecified: Secondary | ICD-10-CM | POA: Diagnosis not present

## 2020-10-17 DIAGNOSIS — I110 Hypertensive heart disease with heart failure: Secondary | ICD-10-CM | POA: Diagnosis not present

## 2020-10-17 DIAGNOSIS — Z7952 Long term (current) use of systemic steroids: Secondary | ICD-10-CM

## 2020-10-17 DIAGNOSIS — Z880 Allergy status to penicillin: Secondary | ICD-10-CM

## 2020-10-17 DIAGNOSIS — I5021 Acute systolic (congestive) heart failure: Secondary | ICD-10-CM | POA: Diagnosis not present

## 2020-10-17 DIAGNOSIS — K582 Mixed irritable bowel syndrome: Secondary | ICD-10-CM | POA: Diagnosis present

## 2020-10-17 DIAGNOSIS — I10 Essential (primary) hypertension: Secondary | ICD-10-CM | POA: Diagnosis present

## 2020-10-17 DIAGNOSIS — I2510 Atherosclerotic heart disease of native coronary artery without angina pectoris: Secondary | ICD-10-CM | POA: Diagnosis present

## 2020-10-17 DIAGNOSIS — R9431 Abnormal electrocardiogram [ECG] [EKG]: Secondary | ICD-10-CM | POA: Diagnosis present

## 2020-10-17 DIAGNOSIS — K8063 Calculus of gallbladder and bile duct with acute cholecystitis with obstruction: Principal | ICD-10-CM | POA: Diagnosis present

## 2020-10-17 DIAGNOSIS — I420 Dilated cardiomyopathy: Secondary | ICD-10-CM | POA: Diagnosis present

## 2020-10-17 DIAGNOSIS — K831 Obstruction of bile duct: Secondary | ICD-10-CM | POA: Diagnosis not present

## 2020-10-17 DIAGNOSIS — R7401 Elevation of levels of liver transaminase levels: Secondary | ICD-10-CM | POA: Diagnosis present

## 2020-10-17 DIAGNOSIS — Z882 Allergy status to sulfonamides status: Secondary | ICD-10-CM

## 2020-10-17 DIAGNOSIS — M3500 Sicca syndrome, unspecified: Secondary | ICD-10-CM | POA: Diagnosis present

## 2020-10-17 DIAGNOSIS — Z7901 Long term (current) use of anticoagulants: Secondary | ICD-10-CM

## 2020-10-17 DIAGNOSIS — J449 Chronic obstructive pulmonary disease, unspecified: Secondary | ICD-10-CM | POA: Diagnosis present

## 2020-10-17 DIAGNOSIS — R1310 Dysphagia, unspecified: Secondary | ICD-10-CM | POA: Diagnosis not present

## 2020-10-17 DIAGNOSIS — R278 Other lack of coordination: Secondary | ICD-10-CM | POA: Diagnosis present

## 2020-10-17 DIAGNOSIS — I252 Old myocardial infarction: Secondary | ICD-10-CM

## 2020-10-17 DIAGNOSIS — E860 Dehydration: Secondary | ICD-10-CM | POA: Diagnosis not present

## 2020-10-17 DIAGNOSIS — Z87892 Personal history of anaphylaxis: Secondary | ICD-10-CM

## 2020-10-17 DIAGNOSIS — E785 Hyperlipidemia, unspecified: Secondary | ICD-10-CM | POA: Diagnosis present

## 2020-10-17 DIAGNOSIS — R197 Diarrhea, unspecified: Secondary | ICD-10-CM | POA: Diagnosis not present

## 2020-10-17 DIAGNOSIS — Z87440 Personal history of urinary (tract) infections: Secondary | ICD-10-CM

## 2020-10-17 DIAGNOSIS — K5790 Diverticulosis of intestine, part unspecified, without perforation or abscess without bleeding: Secondary | ICD-10-CM | POA: Diagnosis present

## 2020-10-17 DIAGNOSIS — K8050 Calculus of bile duct without cholangitis or cholecystitis without obstruction: Secondary | ICD-10-CM | POA: Diagnosis present

## 2020-10-17 DIAGNOSIS — D8481 Immunodeficiency due to conditions classified elsewhere: Secondary | ICD-10-CM | POA: Diagnosis present

## 2020-10-17 DIAGNOSIS — Z90710 Acquired absence of both cervix and uterus: Secondary | ICD-10-CM

## 2020-10-17 DIAGNOSIS — M1990 Unspecified osteoarthritis, unspecified site: Secondary | ICD-10-CM | POA: Diagnosis present

## 2020-10-17 DIAGNOSIS — K838 Other specified diseases of biliary tract: Secondary | ICD-10-CM | POA: Diagnosis not present

## 2020-10-17 DIAGNOSIS — Z91048 Other nonmedicinal substance allergy status: Secondary | ICD-10-CM

## 2020-10-17 DIAGNOSIS — Z9889 Other specified postprocedural states: Secondary | ICD-10-CM | POA: Diagnosis not present

## 2020-10-17 DIAGNOSIS — Z825 Family history of asthma and other chronic lower respiratory diseases: Secondary | ICD-10-CM

## 2020-10-17 DIAGNOSIS — M4800 Spinal stenosis, site unspecified: Secondary | ICD-10-CM | POA: Diagnosis present

## 2020-10-17 DIAGNOSIS — K8309 Other cholangitis: Secondary | ICD-10-CM | POA: Diagnosis present

## 2020-10-17 DIAGNOSIS — K8020 Calculus of gallbladder without cholecystitis without obstruction: Secondary | ICD-10-CM | POA: Diagnosis not present

## 2020-10-17 DIAGNOSIS — M6281 Muscle weakness (generalized): Secondary | ICD-10-CM | POA: Diagnosis present

## 2020-10-17 DIAGNOSIS — R079 Chest pain, unspecified: Secondary | ICD-10-CM

## 2020-10-17 LAB — PROTIME-INR
INR: 3.3 — ABNORMAL HIGH (ref 0.8–1.2)
Prothrombin Time: 33.6 seconds — ABNORMAL HIGH (ref 11.4–15.2)

## 2020-10-17 LAB — COMPREHENSIVE METABOLIC PANEL
ALT: 288 U/L — ABNORMAL HIGH (ref 0–44)
AST: 633 U/L — ABNORMAL HIGH (ref 15–41)
Albumin: 3.3 g/dL — ABNORMAL LOW (ref 3.5–5.0)
Alkaline Phosphatase: 269 U/L — ABNORMAL HIGH (ref 38–126)
Anion gap: 6 (ref 5–15)
BUN: 14 mg/dL (ref 8–23)
CO2: 28 mmol/L (ref 22–32)
Calcium: 8.5 mg/dL — ABNORMAL LOW (ref 8.9–10.3)
Chloride: 101 mmol/L (ref 98–111)
Creatinine, Ser: 0.73 mg/dL (ref 0.44–1.00)
GFR, Estimated: >60 mL/min (ref >60)
Glucose, Bld: 87 mg/dL (ref 70–99)
Potassium: 3.6 mmol/L (ref 3.5–5.1)
Sodium: 135 mmol/L (ref 135–145)
Total Bilirubin: 2.5 mg/dL — ABNORMAL HIGH (ref 0.3–1.2)
Total Protein: 5.3 g/dL — ABNORMAL LOW (ref 6.5–8.1)

## 2020-10-17 LAB — URINALYSIS, ROUTINE W REFLEX MICROSCOPIC
Glucose, UA: NEGATIVE mg/dL
Hgb urine dipstick: NEGATIVE
Ketones, ur: NEGATIVE mg/dL
Leukocytes,Ua: NEGATIVE
Nitrite: NEGATIVE
Protein, ur: 30 mg/dL — AB
Specific Gravity, Urine: >1.046 — ABNORMAL HIGH (ref 1.005–1.030)
pH: 5 (ref 5.0–8.0)

## 2020-10-17 LAB — DIFFERENTIAL
Abs Immature Granulocytes: 0.06 10*3/uL (ref 0.00–0.07)
Basophils Absolute: 0.1 10*3/uL (ref 0.0–0.1)
Basophils Relative: 0 %
Eosinophils Absolute: 0 10*3/uL (ref 0.0–0.5)
Eosinophils Relative: 0 %
Immature Granulocytes: 0 %
Lymphocytes Relative: 1 %
Lymphs Abs: 0.2 10*3/uL — ABNORMAL LOW (ref 0.7–4.0)
Monocytes Absolute: 0.3 10*3/uL (ref 0.1–1.0)
Monocytes Relative: 2 %
Neutro Abs: 15.3 10*3/uL — ABNORMAL HIGH (ref 1.7–7.7)
Neutrophils Relative %: 97 %

## 2020-10-17 LAB — CBC
HCT: 45.2 % (ref 36.0–46.0)
Hemoglobin: 14.2 g/dL (ref 12.0–15.0)
MCH: 31.6 pg (ref 26.0–34.0)
MCHC: 31.4 g/dL (ref 30.0–36.0)
MCV: 100.4 fL — ABNORMAL HIGH (ref 80.0–100.0)
Platelets: 162 10*3/uL (ref 150–400)
RBC: 4.5 MIL/uL (ref 3.87–5.11)
RDW: 15.2 % (ref 11.5–15.5)
WBC: 16.3 10*3/uL — ABNORMAL HIGH (ref 4.0–10.5)
nRBC: 0 % (ref 0.0–0.2)

## 2020-10-17 LAB — LIPASE, BLOOD: Lipase: 27 U/L (ref 11–51)

## 2020-10-17 LAB — RESP PANEL BY RT-PCR (FLU A&B, COVID) ARPGX2
Influenza A by PCR: NEGATIVE
Influenza B by PCR: NEGATIVE
SARS Coronavirus 2 by RT PCR: NEGATIVE

## 2020-10-17 LAB — TROPONIN I (HIGH SENSITIVITY)
Troponin I (High Sensitivity): 18 ng/L — ABNORMAL HIGH (ref <18)
Troponin I (High Sensitivity): 19 ng/L — ABNORMAL HIGH (ref <18)

## 2020-10-17 LAB — LACTIC ACID, PLASMA: Lactic Acid, Venous: 1.8 mmol/L (ref 0.5–1.9)

## 2020-10-17 MED ORDER — LACTATED RINGERS IV SOLN: INTRAVENOUS | Status: DC

## 2020-10-17 MED ORDER — IOHEXOL 300 MG/ML  SOLN
75.0000 mL | Freq: Once | INTRAMUSCULAR | Status: AC | PRN
  Administered 2020-10-17: 75 mL via INTRAVENOUS

## 2020-10-17 MED ORDER — ONDANSETRON HCL 4 MG PO TABS: 4.0000 mg | ORAL_TABLET | Freq: Four times a day (QID) | ORAL | Status: DC | PRN

## 2020-10-17 MED ORDER — ONDANSETRON HCL 4 MG/2ML IJ SOLN
4.0000 mg | Freq: Once | INTRAMUSCULAR | Status: AC
  Administered 2020-10-17: 4 mg via INTRAVENOUS
  Filled 2020-10-17: qty 2

## 2020-10-17 MED ORDER — PREDNISONE 5 MG PO TABS
5.0000 mg | ORAL_TABLET | Freq: Every morning | ORAL | Status: DC
  Administered 2020-10-18 – 2020-10-27 (×8): 5 mg via ORAL
  Filled 2020-10-17 (×8): qty 1

## 2020-10-17 MED ORDER — ATENOLOL 25 MG PO TABS
12.5000 mg | ORAL_TABLET | Freq: Two times a day (BID) | ORAL | Status: DC
  Administered 2020-10-18 – 2020-10-28 (×11): 12.5 mg via ORAL
  Filled 2020-10-17 (×18): qty 1

## 2020-10-17 MED ORDER — ALBUTEROL SULFATE HFA 108 (90 BASE) MCG/ACT IN AERS
1.0000 | INHALATION_SPRAY | Freq: Four times a day (QID) | RESPIRATORY_TRACT | Status: DC | PRN
  Administered 2020-10-24: 2 via RESPIRATORY_TRACT
  Filled 2020-10-17: qty 6.7

## 2020-10-17 MED ORDER — SODIUM CHLORIDE 0.9 % IV SOLN
1.0000 g | Freq: Once | INTRAVENOUS | Status: AC
  Administered 2020-10-17: 1 g via INTRAVENOUS
  Filled 2020-10-17: qty 1

## 2020-10-17 MED ORDER — CYCLOSPORINE 0.05 % OP EMUL
1.0000 [drp] | Freq: Two times a day (BID) | OPHTHALMIC | Status: DC
  Administered 2020-10-18 – 2020-10-28 (×16): 1 [drp] via OPHTHALMIC
  Filled 2020-10-17 (×23): qty 30

## 2020-10-17 MED ORDER — ACETAMINOPHEN 650 MG RE SUPP: 650.0000 mg | Freq: Four times a day (QID) | RECTAL | Status: DC | PRN

## 2020-10-17 MED ORDER — SODIUM CHLORIDE 0.9 % IV BOLUS
1000.0000 mL | Freq: Once | INTRAVENOUS | Status: AC
  Administered 2020-10-17: 1000 mL via INTRAVENOUS

## 2020-10-17 MED ORDER — ONDANSETRON HCL 4 MG/2ML IJ SOLN
4.0000 mg | Freq: Four times a day (QID) | INTRAMUSCULAR | Status: DC | PRN
  Administered 2020-10-17 – 2020-10-23 (×6): 4 mg via INTRAVENOUS
  Filled 2020-10-17 (×9): qty 2

## 2020-10-17 MED ORDER — HYDROMORPHONE HCL 1 MG/ML IJ SOLN
0.5000 mg | Freq: Once | INTRAMUSCULAR | Status: AC
  Administered 2020-10-17: 0.5 mg via INTRAVENOUS
  Filled 2020-10-17: qty 1

## 2020-10-17 MED ORDER — LORATADINE 10 MG PO TABS
10.0000 mg | ORAL_TABLET | Freq: Every day | ORAL | Status: DC
  Administered 2020-10-18 – 2020-10-28 (×6): 10 mg via ORAL
  Filled 2020-10-17 (×11): qty 1

## 2020-10-17 MED ORDER — PANTOPRAZOLE SODIUM 40 MG IV SOLR
40.0000 mg | INTRAVENOUS | Status: DC
  Administered 2020-10-17 – 2020-10-24 (×8): 40 mg via INTRAVENOUS
  Filled 2020-10-17 (×8): qty 40

## 2020-10-17 MED ORDER — PHYTONADIONE 5 MG PO TABS
10.0000 mg | ORAL_TABLET | Freq: Once | ORAL | Status: AC
  Administered 2020-10-17: 10 mg via ORAL
  Filled 2020-10-17: qty 2

## 2020-10-17 MED ORDER — ACETAMINOPHEN 325 MG PO TABS
650.0000 mg | ORAL_TABLET | Freq: Four times a day (QID) | ORAL | Status: DC | PRN
  Administered 2020-10-19: 650 mg via ORAL
  Filled 2020-10-17: qty 2

## 2020-10-17 MED ORDER — FENTANYL CITRATE PF 50 MCG/ML IJ SOSY
12.5000 ug | PREFILLED_SYRINGE | INTRAMUSCULAR | Status: DC | PRN
  Administered 2020-10-17 – 2020-10-25 (×40): 50 ug via INTRAVENOUS
  Administered 2020-10-25: 12.5 ug via INTRAVENOUS
  Administered 2020-10-25: 50 ug via INTRAVENOUS
  Filled 2020-10-17 (×44): qty 1

## 2020-10-17 MED ORDER — METHOCARBAMOL 500 MG PO TABS
750.0000 mg | ORAL_TABLET | Freq: Four times a day (QID) | ORAL | Status: DC | PRN
  Administered 2020-10-17 – 2020-10-21 (×5): 750 mg via ORAL
  Filled 2020-10-17 (×5): qty 2

## 2020-10-17 MED ORDER — LEVOTHYROXINE SODIUM 100 MCG PO TABS
100.0000 ug | ORAL_TABLET | Freq: Every morning | ORAL | Status: DC
  Administered 2020-10-19 – 2020-10-28 (×9): 100 ug via ORAL
  Filled 2020-10-17 (×9): qty 1

## 2020-10-17 MED ORDER — SODIUM CHLORIDE 0.9 % IV SOLN
1.0000 g | Freq: Two times a day (BID) | INTRAVENOUS | Status: DC
  Administered 2020-10-17 – 2020-10-22 (×11): 1 g via INTRAVENOUS
  Filled 2020-10-17 (×13): qty 1

## 2020-10-17 NOTE — ED Provider Notes (Signed)
[facility] EMERGENCY DEPARTMENT Provider Note   CSN: [id number] Arrival date & time: 10/17/20  0750     History Chief Complaint  Patient presents with   Diarrhea    Angela Wong is a 80 y.o. female.  Patient complains of nausea and diarrhea for couple days.  No fever no chills no cough  The history is provided by the patient and medical records. No language interpreter was used.  Diarrhea Quality:  Malodorous Severity:  Moderate Onset quality:  Sudden Timing:  Constant Progression:  Worsening Relieved by:  Nothing Associated symptoms: abdominal pain   Associated symptoms: no chills and no headaches       Past Medical History:  Diagnosis Date   A-fib ([place])    Asthma    Cerebral vasculitis    Congestive dilated cardiomyopathy (HCC)    COPD (chronic obstructive pulmonary disease) (HCC)    Fibromyalgia    Gastric polyp    GERD (gastroesophageal reflux disease)    Hiatal hernia    Hyperparathyroidism (HCC)    IBS (irritable bowel syndrome)    MVP (mitral valve prolapse)    Osteoarthritis    Ovarian cancer (HCC)    lymph node removal with hysterectomy   Raynaud's disease    RLS (restless legs syndrome)    Situational depression    Sjogren's syndrome ([place])    Vasculitis ([place])    Vitamin D deficiency     Patient Active Problem List   Diagnosis Date Noted   Choledocholithiasis 10/17/2020   Multiple allergies 09/29/2020   Pacemaker 07/22/2019   Drug intolerance 07/22/2019   Edema of both lower extremities 07/22/2019   Subtherapeutic international normalized ratio (INR) 07/05/2019   CAD (coronary artery disease) 07/05/2019   Non-STEMI (non-ST elevated myocardial infarction) ([place]) 07/01/2019   Chronic constipation 06/29/2019   Hypertensive heart disease with heart failure ([place]) 06/29/2019   Decompensated heart failure ([place]) 06/22/2019   Pressure injury of skin 06/22/2019   RA (rheumatoid arthritis) ([place]) 05/04/2019   Anticoagulation goal of INR 2 to 3  12/18/2017   Depression, major, single episode, moderate ([place]) 09/12/2017   Chest pain 08/25/2017   Generalized weakness 08/25/2017   Seasonal and perennial allergic rhinitis 12/31/2016   Allergic rhinitis with a nonallergic component 10/01/2016   Itching 10/01/2016   Skeeter syndrome 10/01/2016   Steroid-induced osteopenia 07/11/2016   Hyperparathyroidism ([place]) 01/16/2016   Iron deficiency anemia 12/18/2015   Insomnia 12/18/2015   Hypothyroidism 12/18/2015   Rotator cuff tear 03/23/2014   Osteoarthritis    Moderate persistent asthma without complication    Ovarian cancer ([place])    Fibromyalgia    RLS (restless legs syndrome)    Cerebral vasculitis    Sjogren's syndrome ([place])    Raynaud's disease    MVP (mitral valve prolapse)    IBS (irritable bowel syndrome)    Congestive dilated cardiomyopathy ([place]) 02/09/2014   Dyspnea 12/13/2013   Dyslipidemia 11/26/2013   Malaise and fatigue 11/26/2013   Allergy to multiple antibiotics 11/02/2013   History of ovarian cancer 10/01/2013   GERD (gastroesophageal reflux disease) 10/01/2013   Hypertension 10/01/2013   Atrial fibrillation, permanent ([place]) 07/27/2013    Past Surgical History:  Procedure Laterality Date   ABDOMINAL HYSTERECTOMY  1982   with right oophorectomy   APPENDECTOMY     BREAST BIOPSY Right    x 2   BREAST SURGERY     Biopsy   CARPAL TUNNEL RELEASE Right 1980   CARPAL TUNNEL RELEASE Left 2010  x 2   CATARACT EXTRACTION Bilateral    CESAREAN SECTION     x 3   KNEE SURGERY Left 2005   LEFT HEART CATH AND CORONARY ANGIOGRAPHY N/A 07/02/2019   Procedure: LEFT HEART CATH AND CORONARY ANGIOGRAPHY;  Surgeon: Jettie Booze, MD;  Location: [facility];  Service: Cardiovascular;  Laterality: N/A;   OOPHORECTOMY Left [place]  09/15/2014   REFRACTIVE SURGERY Bilateral 2014   TUBAL LIGATION       OB History     Gravida      Para      Term      Preterm      AB      Living   3      SAB      IAB      Ectopic      Multiple      Live Births              Family History  Problem Relation Age of Onset   COPD Mother    Breast cancer Mother    Allergic rhinitis Mother    Heart disease Father        No details   Kidney disease Father    Allergic rhinitis Father    Stroke Sister    Arthritis/Rheumatoid Sister    Asthma Sister    Lupus Sister    Heart attack Sister    Stroke Paternal Grandmother    Scleroderma Grandchild    Thyroid disease Other    Breast cancer Maternal Aunt        x 2    Social History   Tobacco Use   Smoking status: Never   Smokeless tobacco: Never  Vaping Use   Vaping Use: Never used  Substance Use Topics   Alcohol use: No   Drug use: No    Home Medications Prior to Admission medications   Medication Sig Start Date End Date Taking? Authorizing Provider  albuterol (VENTOLIN HFA) 108 (90 Base) MCG/ACT inhaler INHALE 2 PUFFS BY MOUTH EVERY 6 HOURS AS NEEDED FOR WHEEZING 08/10/18   Terald Sleeper, PA-C  atenolol (TENORMIN) 25 MG tablet Take 0.5 tablets (12.5 mg total) by mouth 2 (two) times daily. 07/05/19   Furth, Cadence H, PA-C  azithromycin (ZITHROMAX Z-PAK) 250 MG tablet As directed 10/16/20   Hassell Done, Mary-Margaret, FNP  fexofenadine (ALLEGRA) 180 MG tablet Take 180 mg by mouth every morning.     [provider]  hydrocortisone-pramoxine [facility]) 2.5-1 % rectal cream Place 1 application rectally See admin instructions. After each bathroom use 09/08/20   [provider]  levothyroxine (EUTHYROX) 50 MCG tablet Take 2 tablets (100 mcg total) by mouth every morning. 08/05/19   [name], FNP  methocarbamol (ROBAXIN) 750 MG tablet Take 750 mg by mouth 4 (four) times daily.    [provider]  oxyCODONE-acetaminophen (PERCOCET) 7.5-325 MG tablet Take 1 tablet by mouth every 6 (six) hours as needed for severe pain.    [provider]  predniSONE (DELTASONE) 5 MG tablet Take 1  tablet (5 mg total) by mouth every morning. 10/10/20   Hawks, [name] A, FNP  RESTASIS 0.05 % ophthalmic emulsion INSTILL 1 DROP INTO EACH EYE TWICE DAILY Patient taking differently: Place 1 drop into both eyes 2 (two) times daily. 11/12/19   [name] A, FNP  warfarin (COUMADIN) 1 MG tablet TAKE 4MG  MONDAY, WEDNESDAY, AND FRIDAY. TAKE 2 MG TUESDAY, THURSDAY,  SATURDAY, AND SUNDAY 06/27/20   [name] A, FNP    Allergies    Cephalosporins, Ciprofloxacin, Diltiazem, Doxycycline, Horse-derived products, Ketek [telithromycin], Nitrofuran derivatives, Nitrous oxide, Other, Penicillins, Pentazocine, Sulfa antibiotics, Trovan [alatrofloxacin], Vitamin b12, Zinc gelatin [zinc], Amlodipine, Calcium channel blockers, Carvedilol, Clindamycin/lincomycin, Codeine, Cymbalta [duloxetine hcl], Diovan [valsartan], Lisinopril, Sertraline, Tramadol, and Loteprednol etabonate  Review of Systems   Review of Systems  Constitutional:  Negative for appetite change, chills and fatigue.  HENT:  Negative for congestion, ear discharge and sinus pressure.   Eyes:  Negative for discharge.  Respiratory:  Negative for cough.   Cardiovascular:  Negative for chest pain.  Gastrointestinal:  Positive for abdominal pain and diarrhea.  Genitourinary:  Negative for frequency and hematuria.  Musculoskeletal:  Negative for back pain.  Skin:  Negative for rash.  Neurological:  Negative for seizures and headaches.  Psychiatric/Behavioral:  Negative for hallucinations.    Physical Exam Updated Vital Signs BP (!) 111/53   Pulse 65   Resp 19   Ht 5\' 4"  (1.626 m)   Wt 49.4 kg   SpO2 95%   BMI 18.71 kg/m   Physical Exam Vitals and nursing note reviewed.  Constitutional:      Appearance: She is well-developed.  HENT:     Head: Normocephalic.     Nose: Nose normal.  Eyes:     General: No scleral icterus.    Conjunctiva/sclera: Conjunctivae normal.  Neck:     Thyroid: No thyromegaly.  Cardiovascular:     Rate  and Rhythm: Normal rate and regular rhythm.     Heart sounds: No murmur heard.   No friction rub. No gallop.  Pulmonary:     Breath sounds: No stridor. No wheezing or rales.  Chest:     Chest wall: No tenderness.  Abdominal:     General: There is no distension.     Tenderness: There is abdominal tenderness. There is no rebound.  Musculoskeletal:        General: Normal range of motion.     Cervical back: Neck supple.  Lymphadenopathy:     Cervical: No cervical adenopathy.  Skin:    Findings: No erythema or rash.  Neurological:     Mental Status: She is alert and oriented to person, place, and time.     Motor: No abnormal muscle tone.     Coordination: Coordination normal.  Psychiatric:        Behavior: Behavior normal.    ED Results / Procedures / Treatments   Labs (all labs ordered are listed, but only abnormal results are displayed) Labs Reviewed  COMPREHENSIVE METABOLIC PANEL - Abnormal; Notable for the following components:      Result Value   Calcium 8.5 (*)    Total Protein 5.3 (*)    Albumin 3.3 (*)    AST 633 (*)    ALT 288 (*)    Alkaline Phosphatase 269 (*)    Total Bilirubin 2.5 (*)    All other components within normal limits  CBC - Abnormal; Notable for the following components:   WBC 16.3 (*)    MCV 100.4 (*)    All other components within normal limits  DIFFERENTIAL - Abnormal; Notable for the following components:   Neutro Abs 15.3 (*)    Lymphs Abs 0.2 (*)    All other components within normal limits  CULTURE, BLOOD (ROUTINE X 2)  CULTURE, BLOOD (ROUTINE X 2)  RESP PANEL BY RT-PCR (FLU A&B, COVID) ARPGX2  LIPASE,  BLOOD  URINALYSIS, ROUTINE W REFLEX MICROSCOPIC  LACTIC ACID, PLASMA  PROTIME-INR    EKG None  Radiology CT ABDOMEN PELVIS W CONTRAST  Result Date: 10/17/2020 CLINICAL DATA:  An 80 year old female presents with abdominal pain nausea and diarrhea that began yesterday. EXAM: CT ABDOMEN AND PELVIS WITH CONTRAST TECHNIQUE:  Multidetector CT imaging of the abdomen and pelvis was performed using the standard protocol following bolus administration of intravenous contrast. CONTRAST:  66mL OMNIPAQUE IOHEXOL 300 MG/ML  SOLN COMPARISON:  Comparison made with July 01, 2019. FINDINGS: Lower chest: Cardiomegaly. Pacer device in place, single lead in the RIGHT heart, heart is incompletely imaged. No pericardial effusion. Small RIGHT greater than LEFT pleural effusions. Basilar atelectasis. Hepatobiliary: Marked heterogeneity of hepatic parenchyma with "nutmeg" pattern portal vein is patent into the liver. Hepatic veins are grossly patent. Small cyst along the RIGHT hepatic margin (image 14/2) Increased biliary duct distension with 13 mm dilation of the common bile duct that is new compared to previous imaging. Mild prominence of the main pancreatic duct in the head of the pancreas is similar to the prior study. Small high-density focus at the level of the ampulla (image 27/5) this measures approximately 5 mm. The biliary tree tapers to this level and there is some peribiliary enhancement. This is diffuse throughout the dilated extrahepatic biliary tree. Gallbladder is distended mildly with some small volume pericholecystic fluid. Pancreas: Pancreatic atrophy with mild prominence of the main pancreatic duct in the head of the pancreas which is unchanged, no signs of pancreatic inflammation currently. Spleen: Normal size and contour. Adrenals/Urinary Tract: Adrenal glands are hyperenhancing. Ptotic RIGHT kidney with small cyst in the "lower pole", transverse lie of the RIGHT kidney with similar appearance. No hydronephrosis. Small cyst in the lower pole the LEFT kidney. Also without hydronephrosis or perinephric stranding on the LEFT. Urinary bladder is collapsed limiting assessment. Stomach/Bowel: No sign of acute gastrointestinal finding. Generalized bowel edema in the setting of mild anasarca. Appendix not visible. Post appendectomy based on  history in the medical record. Sigmoid diverticular disease and diverticular changes along the descending colon. Vascular/Lymphatic: Aortic atherosclerosis. No sign of aneurysm. Smooth contour of the IVC. There is no gastrohepatic or hepatoduodenal ligament lymphadenopathy. No retroperitoneal or mesenteric lymphadenopathy. No pelvic sidewall lymphadenopathy. Reproductive: Post hysterectomy.  No adnexal mass. Other: Small volume ascites in the pelvis.  Diffuse body wall edema. Musculoskeletal: No acute bone finding. No destructive bone process. Spinal degenerative changes. Degenerative changes are present in the hips bilaterally similar to prior imaging. These are severe. IMPRESSION: Increased biliary duct distension with some peribiliary enhancement. Small high-density focus at the level of the ampulla this measures approximately 5 mm. Findings raise the question of choledocholithiasis and or small obstructing lesion at the ampulla with resultant cholangitis. Sagittal images show more abrupt termination of the common bile duct than do coronal images (image 62/6) MRI/MRCP may be helpful as possible for further evaluation with HIDA scan as an initial means of further evaluating this patient if MRI is not possible. Ultimately ERCP may be warranted for further evaluation. Pericholecystic fluid, nonspecific in the setting of anasarca with only mild distension of the gallbladder. HIDA scan could be helpful to add specificity to the above findings if there is concern for acute cholecystitis. Hepatic enhancement pattern which is more compatible with congestive hepatopathy. Correlate with laboratory evidence of heart failure as warranted. Anasarca. Sigmoid diverticular disease and diverticular changes along the descending colon. Aortic Atherosclerosis (ICD10-I70.0). Electronically Signed   By: Zetta Bills  M.D.   On: 10/17/2020 10:36    Procedures Procedures   Medications Ordered in ED Medications  sodium chloride  0.9 % bolus 1,000 mL (has no administration in time range)  meropenem (MERREM) 1 g in sodium chloride 0.9 % 100 mL IVPB (has no administration in time range)  sodium chloride 0.9 % bolus 1,000 mL (1,000 mLs Intravenous New Bag/Given 10/17/20 0922)  ondansetron (ZOFRAN) injection 4 mg (4 mg Intravenous Given 10/17/20 0921)  HYDROmorphone (DILAUDID) injection 0.5 mg (0.5 mg Intravenous Given 10/17/20 0922)  iohexol (OMNIPAQUE) 300 MG/ML solution 75 mL (75 mLs Intravenous Contrast Given 10/17/20 0946)   CRITICAL CARE Performed by: Milton Ferguson Total critical care time: 40 minutes Critical care time was exclusive of separately billable procedures and treating other patients. Critical care was necessary to treat or prevent imminent or life-threatening deterioration. Critical care was time spent personally by me on the following activities: development of treatment plan with patient and/or surrogate as well as nursing, discussions with consultants, evaluation of patient's response to treatment, examination of patient, obtaining history from patient or surrogate, ordering and performing treatments and interventions, ordering and review of laboratory studies, ordering and review of radiographic studies, pulse oximetry and re-evaluation of patient's condition.  ED Course  I have reviewed the triage vital signs and the nursing notes.  Pertinent labs & imaging results that were available during my care of the patient were reviewed by me and considered in my medical decision making (see chart for details).    MDM Rules/Calculators/A&P                           Patient with elevated liver enzymes and possible choledocho cholelithiasis.  She will be admitted to medicine with GI consult and possible ERCP Final Clinical Impression(s) / ED Diagnoses Final diagnoses:  None    Rx / DC Orders ED Discharge Orders     None        Milton Ferguson, MD 10/21/20 1149

## 2020-10-17 NOTE — H&P (Signed)
History and Physical    Angela Wong JEH:[id number] DOB: [birth year] DOA: 10/17/2020  PCP: [name], FNP   Patient coming from: Home  Chief Complaint: N/V/D  HPI: Angela Wong is a 80 y.o. female with medical history significant for atrial fibrillation on warfarin, sick sinus syndrome with pacemaker, hypertension, congestive cardiomyopathy, prior NSTEMI with thrombus in distal PLA secondary to subtherapeutic INR, Sjogren's syndrome on chronic prednisone, hypothyroidism, and COPD who presented to the ED with worsening nausea, vomiting, diarrhea that appears to have began over the last few days.  She does not state very clear timeline and states that she has not been feeling well for several weeks to months.  She claims that she began having worsening GI symptoms with right upper quadrant abdominal pain that began last night.  She denies any fevers or chills and denies any overt bleeding.  She has been taking her medications as otherwise prescribed.  She was recently noted to have UTI and was given fosfomycin in the ED on 9/24 and continued to have symptoms of dysuria and was recently prescribed Zithromax for suspected UTI.   ED Course: Vital signs stable and patient is afebrile.  She is noted to have leukocytosis of 16,300.  BPs have been slightly soft and CMP is remarkable for AST 633 and ALT 288 with alk phos 269 and total bilirubin 2.5.  Lipase is within normal limits.  CT abdomen and pelvis with contrast shows increased biliary duct distention up to 13 mm with some peribiliary enhancement.  This is suspicious for choledocholithiasis as well as resultant cholangitis.  There is also some congestive hepatopathy and anasarca.  Review of Systems: Reviewed as noted above, otherwise negative.  Past Medical History:  Diagnosis Date   A-fib ([place])    Asthma    Cerebral vasculitis    Congestive dilated cardiomyopathy (HCC)    COPD (chronic obstructive pulmonary disease) (HCC)     Fibromyalgia    Gastric polyp    GERD (gastroesophageal reflux disease)    Hiatal hernia    Hyperparathyroidism (HCC)    IBS (irritable bowel syndrome)    MVP (mitral valve prolapse)    Osteoarthritis    Ovarian cancer (HCC)    lymph node removal with hysterectomy   Raynaud's disease    RLS (restless legs syndrome)    Situational depression    Sjogren's syndrome ([place])    Vasculitis ([place])    Vitamin D deficiency     Past Surgical History:  Procedure Laterality Date   ABDOMINAL HYSTERECTOMY  1982   with right oophorectomy   APPENDECTOMY     BREAST BIOPSY Right    x 2   BREAST SURGERY     Biopsy   CARPAL TUNNEL RELEASE Right 1980   CARPAL TUNNEL RELEASE Left 2010   x 2   CATARACT EXTRACTION Bilateral    CESAREAN SECTION     x 3   KNEE SURGERY Left 2005   LEFT HEART CATH AND CORONARY ANGIOGRAPHY N/A 07/02/2019   Procedure: LEFT HEART CATH AND CORONARY ANGIOGRAPHY;  Surgeon: Jettie Booze, MD;  Location: [facility];  Service: Cardiovascular;  Laterality: N/A;   OOPHORECTOMY Left [place]  09/15/2014   REFRACTIVE SURGERY Bilateral 2014   TUBAL LIGATION       reports that she has never smoked. She has never used smokeless tobacco. She reports that she does not drink alcohol and does not use drugs.  Allergies  Allergen Reactions  Cephalosporins Hives and Shortness Of Breath   Ciprofloxacin Hives and Shortness Of Breath   Diltiazem Shortness Of Breath    Swollen throat    Doxycycline Hives and Shortness Of Breath   Horse-Derived Products Anaphylaxis   Ketek [Telithromycin] Palpitations    Chest discomfort   Nitrofuran Derivatives Anaphylaxis and Hives    blisters   Nitrous Oxide Nausea And Vomiting    Severe due to Sjogrens (Auto-Immune Disease)   Other Anaphylaxis    ALLERGY TO HORSE SERUM   Penicillins Hives and Shortness Of Breath        Pentazocine Other (See Comments)    Other reaction(s): Mental Status Changes  (intolerance) Altered Mental Status  Altered Mental Status  Altered Mental Status    Sulfa Antibiotics Hives and Shortness Of Breath   Trovan [Alatrofloxacin] Palpitations, Other (See Comments) and Anaphylaxis    Chest pain, dizziness, irregular pulse   Vitamin B12     Confusion and shaking    Zinc Gelatin [Zinc] Anaphylaxis   Amlodipine Swelling   Calcium Channel Blockers     Respiratory distress   Carvedilol Other (See Comments)    Dizziness, "joint pain, depression"   Clindamycin/Lincomycin     CP, lock jaw   Codeine Nausea Only   Cymbalta [Duloxetine Hcl] Swelling   Diovan [Valsartan] Swelling   Lisinopril Swelling   Sertraline Other (See Comments)    confusion   Tramadol Nausea Only   Loteprednol Etabonate Rash    Family History  Problem Relation Age of Onset   COPD Mother    Breast cancer Mother    Allergic rhinitis Mother    Heart disease Father        No details   Kidney disease Father    Allergic rhinitis Father    Stroke Sister    Arthritis/Rheumatoid Sister    Asthma Sister    Lupus Sister    Heart attack Sister    Stroke Paternal Grandmother    Scleroderma Grandchild    Thyroid disease Other    Breast cancer Maternal Aunt        x 2    Prior to Admission medications   Medication Sig Start Date End Date Taking? Authorizing Provider  albuterol (VENTOLIN HFA) 108 (90 Base) MCG/ACT inhaler INHALE 2 PUFFS BY MOUTH EVERY 6 HOURS AS NEEDED FOR WHEEZING 08/10/18   Terald Sleeper, PA-C  atenolol (TENORMIN) 25 MG tablet Take 0.5 tablets (12.5 mg total) by mouth 2 (two) times daily. 07/05/19   Furth, Cadence H, PA-C  azithromycin (ZITHROMAX Z-PAK) 250 MG tablet As directed 10/16/20   Hassell Done, Mary-Margaret, FNP  fexofenadine (ALLEGRA) 180 MG tablet Take 180 mg by mouth every morning.     [provider]  hydrocortisone-pramoxine [facility]) 2.5-1 % rectal cream Place 1 application rectally See admin instructions. After each bathroom use 09/08/20    [provider]  levothyroxine (EUTHYROX) 50 MCG tablet Take 2 tablets (100 mcg total) by mouth every morning. 08/05/19   [name], FNP  methocarbamol (ROBAXIN) 750 MG tablet Take 750 mg by mouth 4 (four) times daily.    [provider]  oxyCODONE-acetaminophen (PERCOCET) 7.5-325 MG tablet Take 1 tablet by mouth every 6 (six) hours as needed for severe pain.    [provider]  predniSONE (DELTASONE) 5 MG tablet Take 1 tablet (5 mg total) by mouth every morning. 10/10/20   [name] A, FNP  RESTASIS 0.05 % ophthalmic emulsion INSTILL 1 DROP INTO EACH EYE TWICE  DAILY Patient taking differently: Place 1 drop into both eyes 2 (two) times daily. 11/12/19   [name] A, FNP  warfarin (COUMADIN) 1 MG tablet TAKE 4MG MONDAY, WEDNESDAY, AND FRIDAY. TAKE 2 MG TUESDAY, THURSDAY, SATURDAY, AND SUNDAY 06/27/20   [name], FNP    Physical Exam: Vitals:   10/17/20 0900 10/17/20 0930 10/17/20 1000 10/17/20 1230  BP: (!) 112/51 (!) 104/43 (!) 117/58 (!) 111/53  Pulse: 69 68 78 65  Resp: 17 (!) 26 19   SpO2: 91% (!) 86% 94% 95%  Weight:      Height:        Constitutional: NAD, calm, comfortable, thin elderly female Vitals:   10/17/20 0900 10/17/20 0930 10/17/20 1000 10/17/20 1230  BP: (!) 112/51 (!) 104/43 (!) 117/58 (!) 111/53  Pulse: 69 68 78 65  Resp: 17 (!) 26 19   SpO2: 91% (!) 86% 94% 95%  Weight:      Height:       Eyes: lids and conjunctivae normal Neck: normal, supple Respiratory: clear to auscultation bilaterally. Normal respiratory effort. No accessory muscle use.  Cardiovascular: Regular rate and rhythm, no murmurs. Abdomen: no tenderness, no distention. Bowel sounds positive.  Musculoskeletal:  No edema. Skin: no rashes, lesions, ulcers.  Psychiatric: Flat affect  Labs on Admission: I have personally reviewed following labs and imaging studies  CBC: Recent Labs  Lab 10/17/20 0833  WBC 16.3*  NEUTROABS 15.3*  HGB 14.2  HCT  45.2  MCV 100.4*  PLT 336   Basic Metabolic Panel: Recent Labs  Lab 10/17/20 0833  NA 135  K 3.6  CL 101  CO2 28  GLUCOSE 87  BUN 14  CREATININE 0.73  CALCIUM 8.5*   GFR: Estimated Creatinine Clearance: 43.7 mL/min (by C-G formula based on SCr of 0.73 mg/dL). Liver Function Tests: Recent Labs  Lab 10/17/20 0833  AST 633*  ALT 288*  ALKPHOS 269*  BILITOT 2.5*  PROT 5.3*  ALBUMIN 3.3*   Recent Labs  Lab 10/17/20 0833  LIPASE 27   No results for input(s): AMMONIA in the last 168 hours. Coagulation Profile: Recent Labs  Lab 10/17/20 0833  INR 3.3*   Cardiac Enzymes: No results for input(s): CKTOTAL, CKMB, CKMBINDEX, TROPONINI in the last 168 hours. BNP (last 3 results) No results for input(s): PROBNP in the last 8760 hours. HbA1C: No results for input(s): HGBA1C in the last 72 hours. CBG: No results for input(s): GLUCAP in the last 168 hours. Lipid Profile: No results for input(s): CHOL, HDL, LDLCALC, TRIG, CHOLHDL, LDLDIRECT in the last 72 hours. Thyroid Function Tests: No results for input(s): TSH, T4TOTAL, FREET4, T3FREE, THYROIDAB in the last 72 hours. Anemia Panel: No results for input(s): VITAMINB12, FOLATE, FERRITIN, TIBC, IRON, RETICCTPCT in the last 72 hours. Urine analysis:    Component Value Date/Time   COLORURINE YELLOW 10/07/2020 1333   APPEARANCEUR CLEAR 10/07/2020 1333   APPEARANCEUR Cloudy (A) 09/29/2020 1232   LABSPEC 1.006 10/07/2020 1333   PHURINE 6.0 10/07/2020 1333   GLUCOSEU NEGATIVE 10/07/2020 1333   HGBUR SMALL (A) 10/07/2020 1333   BILIRUBINUR NEGATIVE 10/07/2020 1333   BILIRUBINUR Negative 09/29/2020 1232   KETONESUR NEGATIVE 10/07/2020 1333   PROTEINUR NEGATIVE 10/07/2020 1333   UROBILINOGEN negative 11/26/2013 1238   NITRITE POSITIVE (A) 10/07/2020 1333   LEUKOCYTESUR LARGE (A) 10/07/2020 1333    Radiological Exams on Admission: CT ABDOMEN PELVIS W CONTRAST  Result Date: 10/17/2020 CLINICAL DATA:  An 80 year old  female presents with abdominal  pain nausea and diarrhea that began yesterday. EXAM: CT ABDOMEN AND PELVIS WITH CONTRAST TECHNIQUE: Multidetector CT imaging of the abdomen and pelvis was performed using the standard protocol following bolus administration of intravenous contrast. CONTRAST:  4m OMNIPAQUE IOHEXOL 300 MG/ML  SOLN COMPARISON:  Comparison made with July 01, 2019. FINDINGS: Lower chest: Cardiomegaly. Pacer device in place, single lead in the RIGHT heart, heart is incompletely imaged. No pericardial effusion. Small RIGHT greater than LEFT pleural effusions. Basilar atelectasis. Hepatobiliary: Marked heterogeneity of hepatic parenchyma with "nutmeg" pattern portal vein is patent into the liver. Hepatic veins are grossly patent. Small cyst along the RIGHT hepatic margin (image 14/2) Increased biliary duct distension with 13 mm dilation of the common bile duct that is new compared to previous imaging. Mild prominence of the main pancreatic duct in the head of the pancreas is similar to the prior study. Small high-density focus at the level of the ampulla (image 27/5) this measures approximately 5 mm. The biliary tree tapers to this level and there is some peribiliary enhancement. This is diffuse throughout the dilated extrahepatic biliary tree. Gallbladder is distended mildly with some small volume pericholecystic fluid. Pancreas: Pancreatic atrophy with mild prominence of the main pancreatic duct in the head of the pancreas which is unchanged, no signs of pancreatic inflammation currently. Spleen: Normal size and contour. Adrenals/Urinary Tract: Adrenal glands are hyperenhancing. Ptotic RIGHT kidney with small cyst in the "lower pole", transverse lie of the RIGHT kidney with similar appearance. No hydronephrosis. Small cyst in the lower pole the LEFT kidney. Also without hydronephrosis or perinephric stranding on the LEFT. Urinary bladder is collapsed limiting assessment. Stomach/Bowel: No sign of acute  gastrointestinal finding. Generalized bowel edema in the setting of mild anasarca. Appendix not visible. Post appendectomy based on history in the medical record. Sigmoid diverticular disease and diverticular changes along the descending colon. Vascular/Lymphatic: Aortic atherosclerosis. No sign of aneurysm. Smooth contour of the IVC. There is no gastrohepatic or hepatoduodenal ligament lymphadenopathy. No retroperitoneal or mesenteric lymphadenopathy. No pelvic sidewall lymphadenopathy. Reproductive: Post hysterectomy.  No adnexal mass. Other: Small volume ascites in the pelvis.  Diffuse body wall edema. Musculoskeletal: No acute bone finding. No destructive bone process. Spinal degenerative changes. Degenerative changes are present in the hips bilaterally similar to prior imaging. These are severe. IMPRESSION: Increased biliary duct distension with some peribiliary enhancement. Small high-density focus at the level of the ampulla this measures approximately 5 mm. Findings raise the question of choledocholithiasis and or small obstructing lesion at the ampulla with resultant cholangitis. Sagittal images show more abrupt termination of the common bile duct than do coronal images (image 62/6) MRI/MRCP may be helpful as possible for further evaluation with HIDA scan as an initial means of further evaluating this patient if MRI is not possible. Ultimately ERCP may be warranted for further evaluation. Pericholecystic fluid, nonspecific in the setting of anasarca with only mild distension of the gallbladder. HIDA scan could be helpful to add specificity to the above findings if there is concern for acute cholecystitis. Hepatic enhancement pattern which is more compatible with congestive hepatopathy. Correlate with laboratory evidence of heart failure as warranted. Anasarca. Sigmoid diverticular disease and diverticular changes along the descending colon. Aortic Atherosclerosis (ICD10-I70.0). Electronically Signed   By:  GZetta BillsM.D.   On: 10/17/2020 10:36     Assessment/Plan Active Problems:   Choledocholithiasis    Acute choledocholithiasis with possible cholangitis -Appreciate GI evaluation with need for ERCP -Continue on heparin drip for now and  avoid warfarin -N.p.o. -Symptomatic management -Trend LFTs -Continue on Merrem for empiric coverage -Continue IV fluid  Transaminitis secondary to above -Continue to trend  Recent dysuria -Continue on Merrem as noted above -Check urine analysis and urine culture  History of atrial fibrillation -INR currently supratherapeutic at 3.3 -Discontinue warfarin and bridged with heparin drip given prior thrombus formation with subtherapeutic INR -Monitor on telemetry -Continue atenolol -Currently rate controlled  History of COPD -No acute bronchospasms -Breathing treatments as needed  History of hypothyroidism -Continue levothyroxine  History of Sjogren's syndrome -Continue on prednisone and Restasis   DVT prophylaxis: Heparin drip Code Status: Full Family Communication: None at bedside; pt lives alone Disposition Plan:Admit for GI evaluation; potential ERCP Consults called:GI Admission status:Inpatient, Tele   Elmond Poehlman D Akshaj Besancon DO [facility]  If 7PM-7AM, please contact night-coverage [email or web address]  10/17/2020, 1:46 PM

## 2020-10-17 NOTE — Progress Notes (Signed)
Pharmacy Antibiotic Note  Angela Wong is a 80 y.o. female admitted on 10/17/2020 with  intra-abdominal infections .  Pharmacy has been consulted for Merrem dosing. Patient has multiple allergies. She was given 1 dose of merrem in ED and tolerated.  Plan: Merrem 1gm IV q12h F/Ucx and clinical progress Monitor V/S, labs  Height: 5\' 4"  (162.6 cm) Weight: 49.4 kg (109 lb) IBW/kg (Calculated) : 54.7  No data recorded.  Recent Labs  Lab 10/17/20 0833 10/17/20 1317  WBC 16.3*  --   CREATININE 0.73  --   LATICACIDVEN  --  1.8    Estimated Creatinine Clearance: 43.7 mL/min (by C-G formula based on SCr of 0.73 mg/dL).    Allergies  Allergen Reactions   Cephalosporins Hives and Shortness Of Breath   Ciprofloxacin Hives and Shortness Of Breath   Diltiazem Shortness Of Breath    Swollen throat    Doxycycline Hives and Shortness Of Breath   Horse-Derived Products Anaphylaxis   Ketek [Telithromycin] Palpitations    Chest discomfort   Nitrofuran Derivatives Anaphylaxis and Hives    blisters   Nitrous Oxide Nausea And Vomiting    Severe due to Sjogrens (Auto-Immune Disease)   Other Anaphylaxis    ALLERGY TO HORSE SERUM   Penicillins Hives and Shortness Of Breath        Pentazocine Other (See Comments)    Other reaction(s): Mental Status Changes (intolerance) Altered Mental Status  Altered Mental Status  Altered Mental Status    Sulfa Antibiotics Hives and Shortness Of Breath   Trovan [Alatrofloxacin] Palpitations, Other (See Comments) and Anaphylaxis    Chest pain, dizziness, irregular pulse   Vitamin B12     Confusion and shaking    Zinc Gelatin [Zinc] Anaphylaxis   Amlodipine Swelling   Calcium Channel Blockers     Respiratory distress   Carvedilol Other (See Comments)    Dizziness, "joint pain, depression"   Clindamycin/Lincomycin     CP, lock jaw   Codeine Nausea Only   Cymbalta [Duloxetine Hcl] Swelling   Diovan [Valsartan] Swelling   Lisinopril Swelling    Sertraline Other (See Comments)    confusion   Tramadol Nausea Only   Loteprednol Etabonate Rash    Antimicrobials this admission: merrem 10/4 >>   Microbiology results: 10/4 BCx: pending  Thank you for allowing pharmacy to be a part of this patient's care. Isac Sarna, BS [place], [place] Clinical Pharmacist Pager [phone] 10/17/2020 3:40 PM

## 2020-10-17 NOTE — Consult Note (Signed)
_0 @   Referring Provider: Dr. Milton Ferguson Primary Care Physician:  [name], FNP Primary Gastroenterologist:  Dr. Hilarie Fredrickson  Date of Admission: 10/17/20 Date of Consultation: 10/17/20  Reason for Consultation:  Abdominal pain, Elevated LFTs, biliary dilation  HPI:  Angela Wong is a 80 y.o. year old female with history of atrial fibrillation on warfarin, HTN, congestive cardiomyopathy, mild LVH with global hypokinesis but EF 50% in June 2021 , sick sinus syndrome with pacemaker, NSTEMI in June 2021 with thrombus in distal PLA suspected to be secondary to subtherapeutic INR, Sjogren's syndrome on chronic prednisone, COPD, GERD, IBS-mixed type who presented to the emergency room with chief complaint of abdominal pain with nausea and single episode of diarrhea.  ED Course:  Soft Bps, low as 104/43, HR wnl, O2 saturation from 91-95% on RA. No temp recorded.  CBC remarkable for WBC 16.3, MCV 100.4.   CMP remarkable for AST 633, ALT 288, alk phos 269, total bilirubin 2.5. Lipase within normal limits. CT A/P with contrast: Increased biliary duct distention up to 13 mm with some peribiliary enhancement.  Small high density focus at the level of the ampulla measures approximately 5 mm.  Findings raise question of choledocholithiasis and or small obstructing lesion at the ampulla with resultant cholangitis.  Pericholecystic fluid, nonspecific in the setting of anasarca with only mild distention of the gallbladder.  Hepatic enhancement pattern more compatible with congestive hepatopathy.  Anasarca.  Today:  Patient reports acute onset nausea without vomiting and diarrhea this morning. Assocaited severe mid abdominal pain across her entire abdomen as well as left flank pain. Has been having intermittent abdominal pain for the last 1+ weeks depending on what she eats. Describes it as excruciating.  Triggered by foods with higher fat content or salads.  She does have history of IBS with  fluctuating bowel habits between constipation to loose stools with associated abdominal cramping prior to bowel movements that improves thereafter, but the symptoms over the last week have been different and more severe.  Her episode of diarrhea this morning was 1, large, mushy bowel movement.  No watery stools.  Denies BRBPR or melena.  She has had fever.  Denies cold or flulike symptoms.  Currently, her abdominal pain is very mild and nausea has resolved.  Admits to chronic intermittent mild reflux symptoms, not currently on any medications for this.  Reports she used to take Zantac which worked very well.  She was then switched to omeprazole, but reports due to new formulations of PPIs, she cannot take these as they contain sulfa or magnesium.  Also reports intermittent dysphagia x1 year occurring with soft foods, solids, or pills with sensation of items getting hung in her mid/upper esophagus.  Symptoms improve if drinking warm liquids.  When drinking cold liquids, she feels to her esophagus may spasm.  She had an EGD in 2009 with small hiatal hernia, gastric polyp resected, gastritis s/p biopsies of stomach and duodenum. No pathology on file.   Chronic intermittent CP. Thinks this is from MSK strain as she has to use her arms a lot.  Slight increase since abdominal pain started today, currently 3/10 in severity on the left of her chest. Also gets CP with night mares.  No SOB. No cough.   Chronic diffuse joint pain.   No NSAIDs.  On Coumadin and chronically on prednisone.    Past Medical History:  Diagnosis Date   A-fib [facility])    Asthma    Cerebral vasculitis  Congestive dilated cardiomyopathy (HCC)    COPD (chronic obstructive pulmonary disease) (HCC)    Fibromyalgia    Gastric polyp    GERD (gastroesophageal reflux disease)    Hiatal hernia    Hyperparathyroidism (HCC)    IBS (irritable bowel syndrome)    MVP (mitral valve prolapse)    Osteoarthritis    Ovarian cancer (HCC)     lymph node removal with hysterectomy   Raynaud's disease    RLS (restless legs syndrome)    Situational depression    Sjogren's syndrome ([place])    Vasculitis ([place])    Vitamin D deficiency     Past Surgical History:  Procedure Laterality Date   ABDOMINAL HYSTERECTOMY  1982   with right oophorectomy   APPENDECTOMY     BREAST BIOPSY Right    x 2   BREAST SURGERY     Biopsy   CARPAL TUNNEL RELEASE Right 1980   CARPAL TUNNEL RELEASE Left 2010   x 2   CATARACT EXTRACTION Bilateral    CESAREAN SECTION     x 3   KNEE SURGERY Left 2005   LEFT HEART CATH AND CORONARY ANGIOGRAPHY N/A 07/02/2019   Procedure: LEFT HEART CATH AND CORONARY ANGIOGRAPHY;  Surgeon: Jettie Booze, MD;  Location: [facility];  Service: Cardiovascular;  Laterality: N/A;   OOPHORECTOMY Left [place]  09/15/2014   REFRACTIVE SURGERY Bilateral 2014   TUBAL LIGATION      Prior to Admission medications   Medication Sig Start Date End Date Taking? Authorizing Provider  albuterol (VENTOLIN HFA) 108 (90 Base) MCG/ACT inhaler INHALE 2 PUFFS BY MOUTH EVERY 6 HOURS AS NEEDED FOR WHEEZING 08/10/18   Terald Sleeper, PA-C  atenolol (TENORMIN) 25 MG tablet Take 0.5 tablets (12.5 mg total) by mouth 2 (two) times daily. 07/05/19   Furth, Cadence H, PA-C  azithromycin (ZITHROMAX Z-PAK) 250 MG tablet As directed 10/16/20   Hassell Done, Mary-Margaret, FNP  fexofenadine (ALLEGRA) 180 MG tablet Take 180 mg by mouth every morning.     [provider]  hydrocortisone-pramoxine [facility]) 2.5-1 % rectal cream Place 1 application rectally See admin instructions. After each bathroom use 09/08/20   [provider]  levothyroxine (EUTHYROX) 50 MCG tablet Take 2 tablets (100 mcg total) by mouth every morning. 08/05/19   [name], FNP  methocarbamol (ROBAXIN) 750 MG tablet Take 750 mg by mouth 4 (four) times daily.    [provider]  oxyCODONE-acetaminophen (PERCOCET) 7.5-325 MG  tablet Take 1 tablet by mouth every 6 (six) hours as needed for severe pain.    [provider]  predniSONE (DELTASONE) 5 MG tablet Take 1 tablet (5 mg total) by mouth every morning. 10/10/20   Hawks, [name] A, FNP  RESTASIS 0.05 % ophthalmic emulsion INSTILL 1 DROP INTO EACH EYE TWICE DAILY Patient taking differently: Place 1 drop into both eyes 2 (two) times daily. 11/12/19   [name] A, FNP  warfarin (COUMADIN) 1 MG tablet TAKE 4MG MONDAY, WEDNESDAY, AND FRIDAY. TAKE 2 MG TUESDAY, THURSDAY, SATURDAY, AND SUNDAY 06/27/20   [name], FNP    Current Facility-Administered Medications  Medication Dose Route Frequency Provider Last Rate Last Admin   meropenem (MERREM) 1 g in sodium chloride 0.9 % 100 mL IVPB  1 g Intravenous Once Milton Ferguson, MD       sodium chloride 0.9 % bolus 1,000 mL  1,000 mL Intravenous Once Milton Ferguson, MD  Current Outpatient Medications  Medication Sig Dispense Refill   albuterol (VENTOLIN HFA) 108 (90 Base) MCG/ACT inhaler INHALE 2 PUFFS BY MOUTH EVERY 6 HOURS AS NEEDED FOR WHEEZING 9 g 0   atenolol (TENORMIN) 25 MG tablet Take 0.5 tablets (12.5 mg total) by mouth 2 (two) times daily. 60 tablet 5   azithromycin (ZITHROMAX Z-PAK) 250 MG tablet As directed 6 tablet 0   fexofenadine (ALLEGRA) 180 MG tablet Take 180 mg by mouth every morning.      hydrocortisone-pramoxine (ANALPRAM-HC) 2.5-1 % rectal cream Place 1 application rectally See admin instructions. After each bathroom use     levothyroxine (EUTHYROX) 50 MCG tablet Take 2 tablets (100 mcg total) by mouth every morning. 180 tablet 3   methocarbamol (ROBAXIN) 750 MG tablet Take 750 mg by mouth 4 (four) times daily.     oxyCODONE-acetaminophen (PERCOCET) 7.5-325 MG tablet Take 1 tablet by mouth every 6 (six) hours as needed for severe pain.     predniSONE (DELTASONE) 5 MG tablet Take 1 tablet (5 mg total) by mouth every morning. 90 tablet 1   RESTASIS 0.05 % ophthalmic emulsion INSTILL  1 DROP INTO EACH EYE TWICE DAILY (Patient taking differently: Place 1 drop into both eyes 2 (two) times daily.) 120 each 0   warfarin (COUMADIN) 1 MG tablet TAKE 4MG MONDAY, WEDNESDAY, AND FRIDAY. TAKE 2 MG TUESDAY, THURSDAY, SATURDAY, AND SUNDAY 90 tablet 3    Allergies as of 10/17/2020 - Review Complete 10/17/2020  Allergen Reaction Noted   Cephalosporins Hives and Shortness Of Breath 04/29/2012   Ciprofloxacin Hives and Shortness Of Breath 04/29/2012   Diltiazem Shortness Of Breath 12/18/2015   Doxycycline Hives and Shortness Of Breath 04/29/2012   Horse-derived products Anaphylaxis 08/05/2013   Ketek [telithromycin] Palpitations 08/05/2013   Nitrofuran derivatives Anaphylaxis and Hives 04/29/2012   Nitrous oxide Nausea And Vomiting 04/29/2012   Other Anaphylaxis 04/29/2012   Penicillins Hives and Shortness Of Breath 04/29/2012   Pentazocine Other (See Comments) 04/29/2012   Sulfa antibiotics Hives and Shortness Of Breath 04/29/2012   Trovan [alatrofloxacin] Palpitations, Other (See Comments), and Anaphylaxis 04/29/2012   Vitamin b12  10/07/2020   Zinc gelatin [zinc] Anaphylaxis 06/17/2019   Amlodipine Swelling 04/29/2012   Calcium channel blockers  12/18/2015   Carvedilol Other (See Comments) 03/23/2014   Clindamycin/lincomycin  03/19/2018   Codeine Nausea Only 04/29/2012   Cymbalta [duloxetine hcl] Swelling 07/27/2013   Diovan [valsartan] Swelling 04/29/2012   Lisinopril Swelling 04/29/2012   Sertraline Other (See Comments) 12/07/2013   Tramadol Nausea Only 04/29/2012   Loteprednol etabonate Rash 12/07/2013    Family History  Problem Relation Age of Onset   COPD Mother    Breast cancer Mother    Allergic rhinitis Mother    Heart disease Father        No details   Kidney disease Father    Allergic rhinitis Father    Stroke Sister    Arthritis/Rheumatoid Sister    Asthma Sister    Lupus Sister    Heart attack Sister    Stroke Paternal Grandmother    Scleroderma  Grandchild    Thyroid disease Other    Breast cancer Maternal Aunt        x 2    Social History   Socioeconomic History   Marital status: Single    Spouse name: Not on file   Number of children: Not on file   Years of education: Not on file   Highest education level: Not on  file  Occupational History   Not on file  Tobacco Use   Smoking status: Never   Smokeless tobacco: Never  Vaping Use   Vaping Use: Never used  Substance and Sexual Activity   Alcohol use: No   Drug use: No   Sexual activity: Not on file  Other Topics Concern   Not on file  Social History Narrative   Lives alone.  Moved from [place].     Caffeine- 6 cups daily, mix of caffeine/decaf   Children- 3   Retired Therapist, sports   Social Determinants of Radio broadcast assistant Strain: Not on [employer] Insecurity: Not on file  Transportation Needs: Not on file  Physical Activity: Not on file  Stress: Not on file  Social Connections: Not on file  Intimate Partner Violence: Not on file    Review of Systems: Gen: See HPI CV: See HPI Resp: See HPI GI: See HPI GU : Denies urinary burning, urinary frequency, urinary incontinence.  MS: Admits to diffuse joint pain. Derm: Denies rash Heme: See HPI  Physical Exam: Vital signs in last 24 hours: Pulse Rate:  [65-78] 65 (10/04 1230) Resp:  [17-26] 19 (10/04 1000) BP: (104-117)/(43-58) 111/53 (10/04 1230) SpO2:  [86 %-95 %] 95 % (10/04 1230) Weight:  [49.4 kg] 49.4 kg (10/04 0755)   General:   Alert,  frail, cachectic, pleasant and cooperative in NAD Head:  Normocephalic and atraumatic. Eyes: Slight scleral icterus.   Ears:  Normal auditory acuity. Nose:  No deformity, discharge,  or lesions. Lungs:  Clear throughout to auscultation.   No wheezes, crackles, or rhonchi. No acute distress. Heart:  Regular rate and rhythm; no murmurs, clicks, rubs,  or gallops. Abdomen:  Soft and nondistended. Mild TTP in RUQ, right mid abdomen, and epigastric area.  No masses,  hepatosplenomegaly or hernias noted. Normal bowel sounds, without guarding, and without rebound.   Rectal:  Deferred   Msk:  Symmetrical without gross deformities. Normal posture. Extremities:  Without edema. Neurologic:  Alert and  oriented x4;  grossly normal neurologically. Skin:  Intact without significant lesions or rashes. Psych:  Normal mood and affect.  Intake/Output from previous day: No intake/output data recorded. Intake/Output this shift: No intake/output data recorded.  Lab Results: Recent Labs    10/17/20 0833  WBC 16.3*  HGB 14.2  HCT 45.2  PLT 162   BMET Recent Labs    10/17/20 0833  NA 135  K 3.6  CL 101  CO2 28  GLUCOSE 87  BUN 14  CREATININE 0.73  CALCIUM 8.5*   LFT Recent Labs    10/17/20 0833  PROT 5.3*  ALBUMIN 3.3*  AST 633*  ALT 288*  ALKPHOS 269*  BILITOT 2.5*   Studies/Results: CT ABDOMEN PELVIS W CONTRAST  Result Date: 10/17/2020 CLINICAL DATA:  An 80 year old female presents with abdominal pain nausea and diarrhea that began yesterday. EXAM: CT ABDOMEN AND PELVIS WITH CONTRAST TECHNIQUE: Multidetector CT imaging of the abdomen and pelvis was performed using the standard protocol following bolus administration of intravenous contrast. CONTRAST:  3m OMNIPAQUE IOHEXOL 300 MG/ML  SOLN COMPARISON:  Comparison made with July 01, 2019. FINDINGS: Lower chest: Cardiomegaly. Pacer device in place, single lead in the RIGHT heart, heart is incompletely imaged. No pericardial effusion. Small RIGHT greater than LEFT pleural effusions. Basilar atelectasis. Hepatobiliary: Marked heterogeneity of hepatic parenchyma with "nutmeg" pattern portal vein is patent into the liver. Hepatic veins are grossly patent. Small cyst along the RIGHT hepatic margin (  image 14/2) Increased biliary duct distension with 13 mm dilation of the common bile duct that is new compared to previous imaging. Mild prominence of the main pancreatic duct in the head of the pancreas is  similar to the prior study. Small high-density focus at the level of the ampulla (image 27/5) this measures approximately 5 mm. The biliary tree tapers to this level and there is some peribiliary enhancement. This is diffuse throughout the dilated extrahepatic biliary tree. Gallbladder is distended mildly with some small volume pericholecystic fluid. Pancreas: Pancreatic atrophy with mild prominence of the main pancreatic duct in the head of the pancreas which is unchanged, no signs of pancreatic inflammation currently. Spleen: Normal size and contour. Adrenals/Urinary Tract: Adrenal glands are hyperenhancing. Ptotic RIGHT kidney with small cyst in the "lower pole", transverse lie of the RIGHT kidney with similar appearance. No hydronephrosis. Small cyst in the lower pole the LEFT kidney. Also without hydronephrosis or perinephric stranding on the LEFT. Urinary bladder is collapsed limiting assessment. Stomach/Bowel: No sign of acute gastrointestinal finding. Generalized bowel edema in the setting of mild anasarca. Appendix not visible. Post appendectomy based on history in the medical record. Sigmoid diverticular disease and diverticular changes along the descending colon. Vascular/Lymphatic: Aortic atherosclerosis. No sign of aneurysm. Smooth contour of the IVC. There is no gastrohepatic or hepatoduodenal ligament lymphadenopathy. No retroperitoneal or mesenteric lymphadenopathy. No pelvic sidewall lymphadenopathy. Reproductive: Post hysterectomy.  No adnexal mass. Other: Small volume ascites in the pelvis.  Diffuse body wall edema. Musculoskeletal: No acute bone finding. No destructive bone process. Spinal degenerative changes. Degenerative changes are present in the hips bilaterally similar to prior imaging. These are severe. IMPRESSION: Increased biliary duct distension with some peribiliary enhancement. Small high-density focus at the level of the ampulla this measures approximately 5 mm. Findings raise the  question of choledocholithiasis and or small obstructing lesion at the ampulla with resultant cholangitis. Sagittal images show more abrupt termination of the common bile duct than do coronal images (image 62/6) MRI/MRCP may be helpful as possible for further evaluation with HIDA scan as an initial means of further evaluating this patient if MRI is not possible. Ultimately ERCP may be warranted for further evaluation. Pericholecystic fluid, nonspecific in the setting of anasarca with only mild distension of the gallbladder. HIDA scan could be helpful to add specificity to the above findings if there is concern for acute cholecystitis. Hepatic enhancement pattern which is more compatible with congestive hepatopathy. Correlate with laboratory evidence of heart failure as warranted. Anasarca. Sigmoid diverticular disease and diverticular changes along the descending colon. Aortic Atherosclerosis (ICD10-I70.0). Electronically Signed   By: Zetta Bills M.D.   On: 10/17/2020 10:36    Impression: Frail, 80 year old female with multiple comorbidities including atrial fibrillation on warfarin, HTN, congestive cardiomyopathy, mild LVH with global hypokinesis but EF 50% in June 2021 , sick sinus syndrome with pacemaker, NSTEMI in June 2021 with thrombus in distal PLA suspected to be secondary to subtherapeutic INR, Sjogren's syndrome on chronic prednisone, COPD, GERD, IBS-mixed type who presented to the emergency room with chief complaint of mid abdominal pain radiating across her abdomen, left flank pain, associated nausea without vomiting, chills, and single episode of diarrhea without BRBPR or melena.  She was found to have significantly elevated LFTs (AST 633, ALT 228, alk phos 269, total bilirubin 2.5), WBC elevated at 16.3, lipase normal.  CT A/P with new biliary duct distention up to 13 mm with some peribiliary enhancement, small high density focus at level of  the ampulla measuring approximately 5 mm raising the  question of choledocholithiasis and/or small obstructing lesion at the ampulla with resultant cholangitis.  Gallbladder was mildly distended with small volume pericholecystic fluid, nonspecific in the setting of anasarca. BP has been soft in the ED. No temperature recorded.   Concern for cholangitis in the setting of choledocholithiasis or other obstruction lesion. Ideally, would pursue ERCP for further evaluation and stone extraction as appropriate, but patient is requesting to speak with GI MD as well as a priest before agreeing to procedures. She will continue IV antibiotics to cover for cholangitis.    Notably, patient is on Coumadin.  INR 3.3 today.  Coumadin will need to be held.  In light of NSTEMI in June 2021 with thrombus suspect to be secondary to subtherapeutic INR, patient will need bridging to the time of procedure.  Heparin will need to be held about 6 hours prior to procedure.  Her INR will need to be no greater than 1.5 on day of procedure. We will need to give vitamin K for INR correction if considering procedures tomorrow. Will discuss with Dr. Jenetta Downer.   GERD: Chronic history of GERD with mild symptoms daily, not on PPI therapy due to reported allergies.  Patient reports new formulations contain magnesium and sulfa which she cannot take.  Previously responded well to Zantac. Will hold off on PPI therapy for now considering multiple allergies.   Dysphagia: 1 year history of intermittent solid food, soft food, and pill dysphagia possibly influenced by chronic, uncontrolled GERD due to reported allergies to PPI ingredients.  Additional considerations include esophageal web, ring, stricture, or dysmotility. Recommended EGD +/- dilation, but patient again states she needs to have confession with a priest before committing to procedures.   Chest Pain: Chronic, intermittent CP. Possibly GI etiology, MSK, and/or anxiety rather than cardiac etiology. Notified hospitalist as patient reported  some worsening today with her abdominal pain. Hospitalist plans to check troponin's considering her history.   Plan: Recommended ERCP and EGD +/- dilation.  Patient request to discuss with GI MD as well as priest prior to making a final decision. Hold Coumadin and bridge with heparin given prior thrombus formation with subtherapeutic INR. Heparin will need to be discontinued 6 hours prior to procedure. She will need IV vitamin K for INR correction if procedures are pursued within the next 24 hours. Will discuss with Dr. Jenetta Downer. If ERCP with possible sphincterotomy, INR no higher than 1.5.  Continue empiric IV antibiotics. Pharmacy consult was placed for Meropenem by hospitalist.  Continue supportive measures.  CBC and CMP in the AM.  Hold off on PPI therapy. If EGD is pursued, pending findings, may need to consider daily acid suppression.  Discussed CP with Dr. Manuella Ghazi who plans to order troponin's.      LOS: 0 days    10/17/2020, 1:17 PM   Aliene Altes, [facility]

## 2020-10-17 NOTE — ED Triage Notes (Signed)
Pt arrived by [facility] for diarrhea and nausea x 1 day.

## 2020-10-17 NOTE — Progress Notes (Signed)
ANTICOAGULATION CONSULT NOTE - Initial Consult  Pharmacy Consult for Heparin Indication: atrial fibrillation  Allergies  Allergen Reactions   Cephalosporins Hives and Shortness Of Breath   Ciprofloxacin Hives and Shortness Of Breath   Diltiazem Shortness Of Breath    Swollen throat    Doxycycline Hives and Shortness Of Breath   Horse-Derived Products Anaphylaxis   Ketek [Telithromycin] Palpitations    Chest discomfort   Nitrofuran Derivatives Anaphylaxis and Hives    blisters   Nitrous Oxide Nausea And Vomiting    Severe due to Sjogrens (Auto-Immune Disease)   Other Anaphylaxis    ALLERGY TO HORSE SERUM   Penicillins Hives and Shortness Of Breath        Pentazocine Other (See Comments)    Other reaction(s): Mental Status Changes (intolerance) Altered Mental Status  Altered Mental Status  Altered Mental Status    Sulfa Antibiotics Hives and Shortness Of Breath   Trovan [Alatrofloxacin] Palpitations, Other (See Comments) and Anaphylaxis    Chest pain, dizziness, irregular pulse   Vitamin B12     Confusion and shaking    Zinc Gelatin [Zinc] Anaphylaxis   Amlodipine Swelling   Calcium Channel Blockers     Respiratory distress   Carvedilol Other (See Comments)    Dizziness, "joint pain, depression"   Clindamycin/Lincomycin     CP, lock jaw   Codeine Nausea Only   Cymbalta [Duloxetine Hcl] Swelling   Diovan [Valsartan] Swelling   Lisinopril Swelling   Sertraline Other (See Comments)    confusion   Tramadol Nausea Only   Loteprednol Etabonate Rash    Patient Measurements: Height: 5\' 4"  (162.6 cm) Weight: 49.4 kg (109 lb) IBW/kg (Calculated) : 54.7 HEPARIN DW (KG): 49.4   Vital Signs: BP: 111/53 (10/04 1230) Pulse Rate: 65 (10/04 1230)  Labs: Recent Labs    10/17/20 0833  HGB 14.2  HCT 45.2  PLT 162  LABPROT 33.6*  INR 3.3*  CREATININE 0.73    Estimated Creatinine Clearance: 43.7 mL/min (by C-G formula based on SCr of 0.73 mg/dL).   Medical  History: Past Medical History:  Diagnosis Date   A-fib ([place])    Asthma    Cerebral vasculitis    Congestive dilated cardiomyopathy (HCC)    COPD (chronic obstructive pulmonary disease) (HCC)    Fibromyalgia    Gastric polyp    GERD (gastroesophageal reflux disease)    Hiatal hernia    Hyperparathyroidism (HCC)    IBS (irritable bowel syndrome)    MVP (mitral valve prolapse)    Osteoarthritis    Ovarian cancer (HCC)    lymph node removal with hysterectomy   Raynaud's disease    RLS (restless legs syndrome)    Situational depression    Sjogren's syndrome (HCC)    Vasculitis (HCC)    Vitamin D deficiency     Medications:  See med rec  Assessment: 80 y.o. female with medical history significant for atrial fibrillation on warfarin, sick sinus syndrome with pacemaker, hypertension, congestive cardiomyopathy, prior NSTEMI with thrombus in distal PLA secondary to subtherapeutic INR. Current INR is 3.3, supratherapeutic. CT of abdomen suspicious for choledocholithiasis and cholangitis. GI to see for possible ERCP. Last dose of coumadin was 10/3 at 2100. Will start heparin when INR<2.  Goal of Therapy:  Heparin level 0.3-0.7 units/ml Monitor platelets by anticoagulation protocol: Yes   Plan:  Start heparin infusion at 700 units/hr when INR <2 Daily PT-INR Check anti-Xa level in ~8 hours and daily while on heparin Continue to monitor  H&H and platelets  Isac Sarna, BS Vena [place], BCPS Clinical Pharmacist Pager [phone] 10/17/2020,2:10 PM

## 2020-10-18 DIAGNOSIS — K219 Gastro-esophageal reflux disease without esophagitis: Secondary | ICD-10-CM | POA: Diagnosis not present

## 2020-10-18 DIAGNOSIS — K8050 Calculus of bile duct without cholangitis or cholecystitis without obstruction: Secondary | ICD-10-CM | POA: Diagnosis not present

## 2020-10-18 LAB — COMPREHENSIVE METABOLIC PANEL
ALT: 204 U/L — ABNORMAL HIGH (ref 0–44)
AST: 189 U/L — ABNORMAL HIGH (ref 15–41)
Albumin: 2.8 g/dL — ABNORMAL LOW (ref 3.5–5.0)
Alkaline Phosphatase: 217 U/L — ABNORMAL HIGH (ref 38–126)
Anion gap: 6 (ref 5–15)
BUN: 17 mg/dL (ref 8–23)
CO2: 25 mmol/L (ref 22–32)
Calcium: 8.1 mg/dL — ABNORMAL LOW (ref 8.9–10.3)
Chloride: 102 mmol/L (ref 98–111)
Creatinine, Ser: 0.74 mg/dL (ref 0.44–1.00)
GFR, Estimated: >60 mL/min (ref >60)
Glucose, Bld: 72 mg/dL (ref 70–99)
Potassium: 4.6 mmol/L (ref 3.5–5.1)
Sodium: 133 mmol/L — ABNORMAL LOW (ref 135–145)
Total Bilirubin: 2.6 mg/dL — ABNORMAL HIGH (ref 0.3–1.2)
Total Protein: 4.9 g/dL — ABNORMAL LOW (ref 6.5–8.1)

## 2020-10-18 LAB — CBC
HCT: 40.2 % (ref 36.0–46.0)
Hemoglobin: 12.8 g/dL (ref 12.0–15.0)
MCH: 31.8 pg (ref 26.0–34.0)
MCHC: 31.8 g/dL (ref 30.0–36.0)
MCV: 99.8 fL (ref 80.0–100.0)
Platelets: 121 10*3/uL — ABNORMAL LOW (ref 150–400)
RBC: 4.03 MIL/uL (ref 3.87–5.11)
RDW: 15.8 % — ABNORMAL HIGH (ref 11.5–15.5)
WBC: 19.9 10*3/uL — ABNORMAL HIGH (ref 4.0–10.5)
nRBC: 0 % (ref 0.0–0.2)

## 2020-10-18 LAB — MAGNESIUM: Magnesium: 1.8 mg/dL (ref 1.7–2.4)

## 2020-10-18 LAB — PROTIME-INR
INR: 3.5 — ABNORMAL HIGH (ref 0.8–1.2)
Prothrombin Time: 35.3 seconds — ABNORMAL HIGH (ref 11.4–15.2)

## 2020-10-18 MED ORDER — PHYTONADIONE 5 MG PO TABS
10.0000 mg | ORAL_TABLET | Freq: Once | ORAL | Status: AC
  Administered 2020-10-18: 10 mg via ORAL
  Filled 2020-10-18: qty 2

## 2020-10-18 NOTE — Progress Notes (Addendum)
10/18/2020 0300:  Pt requesting spiritual consult from priest, "Father P" of [facility] [employer] located at [address] in [place], [place] [address]. The telephone number is [phone]. Pt stated that she "would like prayer before going into surgery because I may not make it." This RN will relay to day shift RN. Pt is resting and call bell within reach.

## 2020-10-18 NOTE — Progress Notes (Signed)
ANTICOAGULATION CONSULT NOTE -   Pharmacy Consult for Heparin Indication: atrial fibrillation  Allergies  Allergen Reactions   Cephalosporins Hives and Shortness Of Breath   Ciprofloxacin Hives and Shortness Of Breath   Diltiazem Shortness Of Breath    Swollen throat    Doxycycline Hives and Shortness Of Breath   Horse-Derived Products Anaphylaxis   Ketek [Telithromycin] Palpitations    Chest discomfort   Nitrofuran Derivatives Anaphylaxis and Hives    blisters   Nitrous Oxide Nausea And Vomiting    Severe due to Sjogrens (Auto-Immune Disease)   Other Anaphylaxis    ALLERGY TO HORSE SERUM   Penicillins Hives and Shortness Of Breath        Pentazocine Other (See Comments)    Other reaction(s): Mental Status Changes (intolerance) Altered Mental Status  Altered Mental Status  Altered Mental Status    Sulfa Antibiotics Hives and Shortness Of Breath   Trovan [Alatrofloxacin] Palpitations, Other (See Comments) and Anaphylaxis    Chest pain, dizziness, irregular pulse   Vitamin B12     Confusion and shaking    Zinc Gelatin [Zinc] Anaphylaxis   Amlodipine Swelling   Calcium Channel Blockers     Respiratory distress   Carvedilol Other (See Comments)    Dizziness, "joint pain, depression"   Clindamycin/Lincomycin     CP, lock jaw   Codeine Nausea Only   Cymbalta [Duloxetine Hcl] Swelling   Diovan [Valsartan] Swelling   Lisinopril Swelling   Sertraline Other (See Comments)    confusion   Tramadol Nausea Only   Loteprednol Etabonate Rash    Patient Measurements: Height: 5\' 4"  (162.6 cm) Weight: 58.3 kg (128 lb 8.5 oz) IBW/kg (Calculated) : 54.7 HEPARIN DW (KG): 49.4   Vital Signs: Temp: 98.8 F (37.1 C) (10/05 0251) Temp Source: Oral (10/05 0251) BP: 118/72 (10/05 0251) Pulse Rate: 63 (10/05 0251)  Labs: Recent Labs    10/17/20 0833 10/17/20 1317 10/17/20 1652 10/18/20 0528  HGB 14.2  --   --  12.8  HCT 45.2  --   --  40.2  PLT 162  --   --  121*   LABPROT 33.6*  --   --  35.3*  INR 3.3*  --   --  3.5*  CREATININE 0.73  --   --  0.74  TROPONINIHS  --  18* 19*  --      Estimated Creatinine Clearance: 48.4 mL/min (by C-G formula based on SCr of 0.74 mg/dL).   Medical History: Past Medical History:  Diagnosis Date   A-fib ([place])    Asthma    Cerebral vasculitis    Congestive dilated cardiomyopathy (HCC)    COPD (chronic obstructive pulmonary disease) (HCC)    Fibromyalgia    Gastric polyp    GERD (gastroesophageal reflux disease)    Hiatal hernia    Hyperparathyroidism (HCC)    IBS (irritable bowel syndrome)    MVP (mitral valve prolapse)    Osteoarthritis    Ovarian cancer (HCC)    lymph node removal with hysterectomy   Raynaud's disease    RLS (restless legs syndrome)    Situational depression    Sjogren's syndrome (HCC)    Vasculitis (HCC)    Vitamin D deficiency     Medications:  See med rec  Assessment: 80 y.o. female with medical history significant for atrial fibrillation on warfarin, sick sinus syndrome with pacemaker, hypertension, congestive cardiomyopathy, prior NSTEMI with thrombus in distal PLA secondary to subtherapeutic INR. Current INR is 3.3,  supratherapeutic. CT of abdomen suspicious for choledocholithiasis and cholangitis. GI to see for possible ERCP. Last dose of coumadin was 10/3 at 2100. Will start heparin when INR<2.  INR increased to 3.5, Vit K 10mg  po x 1 give 10/4 at 1841  Goal of Therapy:  Heparin level 0.3-0.7 units/ml Monitor platelets by anticoagulation protocol: Yes   Plan:  MD to give additional Vit K 10mg  po this AM Start heparin infusion at 700 units/hr when INR <2 Daily PT-INR Check anti-Xa level in ~8 hours and daily while on heparin Continue to monitor H&H and platelets  Isac Sarna, BS Vena [place], BCPS Clinical Pharmacist Pager [phone] 10/18/2020,7:34 AM

## 2020-10-18 NOTE — Progress Notes (Signed)
Initial Nutrition Assessment  DOCUMENTATION CODES:   Not applicable  INTERVENTION:  Soft diet as tolerated   Offer hydration frequently q shift due to pt dry mouth  Snack between meals as desired  NUTRITION DIAGNOSIS:   Inadequate oral intake related to acute illness (N/V/D prior to admission) as evidenced by per patient/family report.   GOAL:  Patient will meet greater than or equal to 90% of their needs   MONITOR:  PO intake, Labs, Skin, Weight trends  REASON FOR ASSESSMENT:   Malnutrition Screening Tool    ASSESSMENT: Patient is a 80 yo female with hx of vitamin D deficiency, atrial fibrillation, NSTEMI, COPD and Sjogren's syndrome (chronic steroids). Presents with Nausea, vomiting and diarrhea.   Patient on a soft foods. Reports ~50% of meals. Able to feed herself. Complaining of dry mouth which she relates to her Sjogren's syndrome. Provided water for her. Patient denies vomiting today. Patient doesn't drink ONS due to allergic to B-12 enhanced products. Patient has numerous (26) allergies listed.   Review of weights 58.3 kg currently. 10/07/20- 53.1 kg and 09/07/20- wt 66 kg. Patient reports weight loss with acute illness.   Medications reviewed and include: Protonix.  Lactated Ringers @ 75 ml/hr  Labs:  BMP Latest Ref Rng & Units 10/18/2020 10/17/2020 09/29/2020  Glucose 70 - 99 mg/dL 72 87 80  BUN 8 - 23 mg/dL 17 14 13   Creatinine 0.44 - 1.00 mg/dL 0.74 0.73 0.71  BUN/Creat Ratio 12 - 28 - - 18  Sodium 135 - 145 mmol/L 133(L) 135 137  Potassium 3.5 - 5.1 mmol/L 4.6 3.6 4.7  Chloride 98 - 111 mmol/L 102 101 99  CO2 22 - 32 mmol/L 25 28 22   Calcium 8.9 - 10.3 mg/dL 8.1(L) 8.5(L) 9.2     NUTRITION - FOCUSED PHYSICAL EXAM: Nutrition-Focused physical exam completed. Findings are mild upper arm fat depletion, mild temporal muscle depletion, and mild edema.     Diet Order:   Diet Order             Diet NPO time specified  Diet effective midnight            DIET SOFT Room service appropriate? Yes; Fluid consistency: Thin  Diet effective now                   EDUCATION NEEDS:  Education needs have been addressed  Skin:  Skin Assessment: Reviewed RN Assessment  Last BM:  10/5  Height:   Ht Readings from Last 1 Encounters:  10/17/20 5\' 4"  (1.626 m)    Weight:   Wt Readings from Last 1 Encounters:  10/18/20 58.3 kg    Ideal Body Weight:   55 kg  BMI:  Body mass index is 22.06 kg/m.  Estimated Nutritional Needs:   Kcal:  1500-1600  Protein:  70-75 gr  Fluid:  1600 ml daily   Colman Cater MS,RD,CSG,LDN Contact: Shea Evans

## 2020-10-18 NOTE — Progress Notes (Signed)
Received call from [facility], patient nurse on unit stating that patient is requesting the Anointing of the Ava (Last Rites) from a [place] called and spoke with Father Celesta Gentile from [employer] in [place] the day before and learned that all priests in the [place] are at a retreat in [place] until Friday morning. Father Celesta Gentile gave this Probation officer permission to perform the [place].  Chaplain introduced self to patient and provided spiritual support to her and explained the situation regarding her request. At first she refused the offer for this writer to provide her with an anointing and then she changed her mind.  Chaplain provided the Anointing of the Sick using oil and [employer] and opportunity for reflection and prayer. Chaplain will remain available in order to provide spiritual support and to assess for spiritual need.   Rev. Bennie Pierini, M.[employer] [phone]

## 2020-10-18 NOTE — Progress Notes (Signed)
Subjective: Patient was asleep upon my arrival to examine her. She states she is very uncomfortable in her bed due to positioning. She does endorse some abdominal pain, especially across her upper abdomen. She denies any nausea or vomiting.  She tolerated her breakfast well, although was unhappy about the type of food/drink she received. She denies much issue with heartburn since being on PPI during admission. She continues to report some L sided chest pain, worse with movement/palpation.  Objective: Vital signs in last 24 hours: Temp:  [98.5 F (36.9 C)-98.8 F (37.1 C)] 98.8 F (37.1 C) (10/05 0251) Pulse Rate:  [48-78] 63 (10/05 0251) Resp:  [16-26] 19 (10/05 0251) BP: (85-122)/(43-73) 118/72 (10/05 0251) SpO2:  [86 %-98 %] 98 % (10/05 0251) Weight:  [58.3 kg] 58.3 kg (10/05 0318) Last BM Date: 10/18/20 General:   Alert and oriented, pleasant Head:  Normocephalic and atraumatic. Eyes:  No icterus, sclera clear. Conjuctiva pink.  Mouth:  Without lesions, mucosa pink and moist.  Heart:  S1, S2 present, no murmurs noted.  Lungs: Clear to auscultation bilaterally, without wheezing, rales, or rhonchi.  Abdomen:  Bowel sounds present, soft,  non-distended. No HSM or hernias noted. No rebound or guarding. No masses appreciated. TTP of upper abdomen.  Msk:  Symmetrical without gross deformities. Normal posture. Pulses:  Normal pulses noted. Extremities:  Without clubbing or edema. Neurologic:  Alert and  oriented x4;  grossly normal neurologically. Skin:  Warm and dry, intact without significant lesions.  Psych:  Alert and cooperative. Normal mood and affect.   Lab Results: Recent Labs    10/17/20 0833 10/18/20 0528  WBC 16.3* 19.9*  HGB 14.2 12.8  HCT 45.2 40.2  PLT 162 121*   BMET Recent Labs    10/17/20 0833 10/18/20 0528  NA 135 133*  K 3.6 4.6  CL 101 102  CO2 28 25  GLUCOSE 87 72  BUN 14 17  CREATININE 0.73 0.74  CALCIUM 8.5* 8.1*   LFT Recent Labs     10/17/20 0833 10/18/20 0528  PROT 5.3* 4.9*  ALBUMIN 3.3* 2.8*  AST 633* 189*  ALT 288* 204*  ALKPHOS 269* 217*  BILITOT 2.5* 2.6*   PT/INR Recent Labs    10/17/20 0833 10/18/20 0528  LABPROT 33.6* 35.3*  INR 3.3* 3.5*     Studies/Results: CT ABDOMEN PELVIS W CONTRAST  Result Date: 10/17/2020 CLINICAL DATA:  An 80 year old female presents with abdominal pain nausea and diarrhea that began yesterday. EXAM: CT ABDOMEN AND PELVIS WITH CONTRAST TECHNIQUE: Multidetector CT imaging of the abdomen and pelvis was performed using the standard protocol following bolus administration of intravenous contrast. CONTRAST:  35m OMNIPAQUE IOHEXOL 300 MG/ML  SOLN COMPARISON:  Comparison made with July 01, 2019. FINDINGS: Lower chest: Cardiomegaly. Pacer device in place, single lead in the RIGHT heart, heart is incompletely imaged. No pericardial effusion. Small RIGHT greater than LEFT pleural effusions. Basilar atelectasis. Hepatobiliary: Marked heterogeneity of hepatic parenchyma with "nutmeg" pattern portal vein is patent into the liver. Hepatic veins are grossly patent. Small cyst along the RIGHT hepatic margin (image 14/2) Increased biliary duct distension with 13 mm dilation of the common bile duct that is new compared to previous imaging. Mild prominence of the main pancreatic duct in the head of the pancreas is similar to the prior study. Small high-density focus at the level of the ampulla (image 27/5) this measures approximately 5 mm. The biliary tree tapers to this level and there is some peribiliary enhancement. This is  diffuse throughout the dilated extrahepatic biliary tree. Gallbladder is distended mildly with some small volume pericholecystic fluid. Pancreas: Pancreatic atrophy with mild prominence of the main pancreatic duct in the head of the pancreas which is unchanged, no signs of pancreatic inflammation currently. Spleen: Normal size and contour. Adrenals/Urinary Tract: Adrenal glands are  hyperenhancing. Ptotic RIGHT kidney with small cyst in the "lower pole", transverse lie of the RIGHT kidney with similar appearance. No hydronephrosis. Small cyst in the lower pole the LEFT kidney. Also without hydronephrosis or perinephric stranding on the LEFT. Urinary bladder is collapsed limiting assessment. Stomach/Bowel: No sign of acute gastrointestinal finding. Generalized bowel edema in the setting of mild anasarca. Appendix not visible. Post appendectomy based on history in the medical record. Sigmoid diverticular disease and diverticular changes along the descending colon. Vascular/Lymphatic: Aortic atherosclerosis. No sign of aneurysm. Smooth contour of the IVC. There is no gastrohepatic or hepatoduodenal ligament lymphadenopathy. No retroperitoneal or mesenteric lymphadenopathy. No pelvic sidewall lymphadenopathy. Reproductive: Post hysterectomy.  No adnexal mass. Other: Small volume ascites in the pelvis.  Diffuse body wall edema. Musculoskeletal: No acute bone finding. No destructive bone process. Spinal degenerative changes. Degenerative changes are present in the hips bilaterally similar to prior imaging. These are severe. IMPRESSION: Increased biliary duct distension with some peribiliary enhancement. Small high-density focus at the level of the ampulla this measures approximately 5 mm. Findings raise the question of choledocholithiasis and or small obstructing lesion at the ampulla with resultant cholangitis. Sagittal images show more abrupt termination of the common bile duct than do coronal images (image 62/6) MRI/MRCP may be helpful as possible for further evaluation with HIDA scan as an initial means of further evaluating this patient if MRI is not possible. Ultimately ERCP may be warranted for further evaluation. Pericholecystic fluid, nonspecific in the setting of anasarca with only mild distension of the gallbladder. HIDA scan could be helpful to add specificity to the above findings if  there is concern for acute cholecystitis. Hepatic enhancement pattern which is more compatible with congestive hepatopathy. Correlate with laboratory evidence of heart failure as warranted. Anasarca. Sigmoid diverticular disease and diverticular changes along the descending colon. Aortic Atherosclerosis (ICD10-I70.0). Electronically Signed   By: Zetta Bills M.D.   On: 10/17/2020 10:36    Assessment: Angela Wong is an 80 year old female with pmh of a fib on warfarin, HTN, congestive cardiomyopathy, mild LVH with global hypokinesis EF 50%, sick sinus syndrome w/pacemaker, NSTEMI w/thrombus in distal PLA, Sjogren's syndrome on chronic prednisone, COPD, GERD, IBS-mixed, who presented to the hospital after new onset of nausea, abdominal pain (RUQ/flank) and fatigue on 10/17/20. She also endorsed some ongoing heartburn and occasional dysphagia. Patient denied melena, hematochezia. BP has been soft throughout admission.  Cholangitis: WBC 19000 (160000 on admission). LFTs today still elevated, however appear to be trending down except for T bili: 2.6(2.5), AST 189(633) and ALT 204(288), Alk Phos 217( 269). lipase remains WNL. CT A/P performed in ED yesterday revealed presence of dilated CBD up to 13 mm with questionable lesion close to the ampulla which could represent choledocolithiasis or obstructing lesion, there was also presence of enlarged gallbladder. She was started on meropenem by hospitalist.   ERCP planned for today, however, patient's INR continues to trend up, currently 3.5, despite PO Vit K administration yesterday. Patient received second dose of PO vitamin K 75m today. ERCP will be postponed until INR 1.5 or below.  GERD: chronic GERD, not currently on PPI due to allergies. Has responded to zantac well  in the past. Pepcid did not work well for her. She is currently on protonix 29m daily, seems to be tolerating this well.  Dysphagia: 1 year hx of intermittent dysphagia with solid food,  soft foods and pills, likely influenced by chronic uncontrolled GERD, however, esophageal web, ring, stricture cannot be ruled out. Recommend EGD +/- dilation once INR comes down.  Chest Pain: chronic, intermittent. Troponins ordered by hospitalist yesterday were fairly unremarkable at 18, and 19 on repeat. Pain is reproduceable with palpation, likely MSK related/costochondritis.   Plan: ERCP and EGD +/- dilation once INR has decreased to 1.5 or less Heparin held due to supratherapeutic INR Continue IV antibiotics, per hospitalist orders Can continue protonix as long as patient is tolerating it Continue to trend LFTs, INR and CBC    LOS: 1 day    10/18/2020, 8:54 AM Angela Eisemann L. CAlver Sorrow MSN, APRN, AGNP-C Adult-Gerontology Nurse Practitioner [facility]

## 2020-10-18 NOTE — Progress Notes (Signed)
PROGRESS NOTE    Angela Wong  FVC:[id number] DOB: 20-Jul-1940 DOA: 10/17/2020 PCP: [name], FNP   Brief Narrative:   Angela Wong is a 80 y.o. female with medical history significant for atrial fibrillation on warfarin, sick sinus syndrome with pacemaker, hypertension, congestive cardiomyopathy, prior NSTEMI with thrombus in distal PLA secondary to subtherapeutic INR, Sjogren's syndrome on chronic prednisone, hypothyroidism, and COPD who presented to the ED with worsening nausea, vomiting, diarrhea that appears to have began over the last few days.  Patient was admitted with acute cholangitis with choledocholithiasis and is awaiting ERCP once INR levels become subtherapeutic. Assessment & Plan:   Active Problems:   Choledocholithiasis   Acute choledocholithiasis with cholangitis -Appreciate GI evaluation with need for ERCP, hopefully by a.m. with decrease in INR levels -Diet given with n.p.o. after midnight -Symptomatic management -Trend LFTs -Continue on Merrem for empiric coverage -Continue IV fluid   Transaminitis secondary to above -Now noted to be decreasing -Continue to trend   Recent dysuria -Continue on Merrem as noted above -Check urine analysis and urine culture   History of atrial fibrillation -INR currently supratherapeutic at 3.3, repeat dose of p.o. vitamin K 10/5 -Warfarin currently discontinued and heparin drip will be used to bridge once INR less than 2 -Monitor on telemetry -Continue atenolol -Currently rate controlled   History of COPD -No acute bronchospasms -Breathing treatments as needed   History of hypothyroidism -Continue levothyroxine   History of Sjogren's syndrome -Continue on prednisone and Restasis   DVT prophylaxis: Therapeutic INR Code Status: Full Family Communication: None at bedside Disposition Plan:  Status is: Inpatient  Remains inpatient appropriate because:Ongoing diagnostic testing needed not appropriate  for outpatient work up, IV treatments appropriate due to intensity of illness or inability to take PO, and Inpatient level of care appropriate due to severity of illness  Dispo: The patient is from: Home              Anticipated d/c is to: Home              Patient currently is not medically stable to d/c.   Difficult to place patient No   Skin Assessment:  I have examined the patient's skin and I agree with the wound assessment as performed by the wound care RN as outlined below:  Pressure Injury 06/22/19 Sacrum Left Stage 2 -  Partial thickness loss of dermis presenting as a shallow open injury with a red, pink wound bed without slough. (Active)  06/22/19 2102  Location: Sacrum  Location Orientation: Left  Staging: Stage 2 -  Partial thickness loss of dermis presenting as a shallow open injury with a red, pink wound bed without slough.  Wound Description (Comments):   Present on Admission: Yes    Consultants:  GI  Procedures:  See below  Antimicrobials:  Anti-infectives (From admission, onward)    Start     Dose/Rate Route Frequency Ordered Stop   10/17/20 2200  meropenem (MERREM) 1 g in sodium chloride 0.9 % 100 mL IVPB        1 g 200 mL/hr over 30 Minutes Intravenous Every 12 hours 10/17/20 1544     10/17/20 1315  meropenem (MERREM) 1 g in sodium chloride 0.9 % 100 mL IVPB        1 g 200 mL/hr over 30 Minutes Intravenous  Once 10/17/20 1307 10/17/20 1518      Subjective: Patient seen and evaluated today with complaints of worsening back pain.  She complains of achiness to her joints as well.  She continues to have elevated INR levels prohibiting ERCP today.  Objective: Vitals:   10/18/20 0200 10/18/20 0251 10/18/20 0318 10/18/20 0938  BP: (!) 96/56 118/72  123/63  Pulse: 62 63  63  Resp: 17 19    Temp:  98.8 F (37.1 C)    TempSrc:  Oral    SpO2: 95% 98%    Weight:   58.3 kg   Height:        Intake/Output Summary (Last 24 hours) at 10/18/2020 1032 Last  data filed at 10/18/2020 0335 Gross per 24 hour  Intake 2027.15 ml  Output --  Net 2027.15 ml   Filed Weights   10/17/20 0755 10/18/20 0318  Weight: 49.4 kg 58.3 kg    Examination:  General exam: Appears calm and comfortable  Respiratory system: Clear to auscultation. Respiratory effort normal. Cardiovascular system: S1 & S2 heard, RRR.  Gastrointestinal system: Abdomen is soft Central nervous system: Alert and awake Extremities: No edema Skin: No significant lesions noted Psychiatry: Flat affect.    Data Reviewed: I have personally reviewed following labs and imaging studies  CBC: Recent Labs  Lab 10/17/20 0833 10/18/20 0528  WBC 16.3* 19.9*  NEUTROABS 15.3*  --   HGB 14.2 12.8  HCT 45.2 40.2  MCV 100.4* 99.8  PLT 162 008*   Basic Metabolic Panel: Recent Labs  Lab 10/17/20 0833 10/18/20 0528  NA 135 133*  K 3.6 4.6  CL 101 102  CO2 28 25  GLUCOSE 87 72  BUN 14 17  CREATININE 0.73 0.74  CALCIUM 8.5* 8.1*  MG  --  1.8   GFR: Estimated Creatinine Clearance: 48.4 mL/min (by C-G formula based on SCr of 0.74 mg/dL). Liver Function Tests: Recent Labs  Lab 10/17/20 0833 10/18/20 0528  AST 633* 189*  ALT 288* 204*  ALKPHOS 269* 217*  BILITOT 2.5* 2.6*  PROT 5.3* 4.9*  ALBUMIN 3.3* 2.8*   Recent Labs  Lab 10/17/20 0833  LIPASE 27   No results for input(s): AMMONIA in the last 168 hours. Coagulation Profile: Recent Labs  Lab 10/17/20 0833 10/18/20 0528  INR 3.3* 3.5*   Cardiac Enzymes: No results for input(s): CKTOTAL, CKMB, CKMBINDEX, TROPONINI in the last 168 hours. BNP (last 3 results) No results for input(s): PROBNP in the last 8760 hours. HbA1C: No results for input(s): HGBA1C in the last 72 hours. CBG: No results for input(s): GLUCAP in the last 168 hours. Lipid Profile: No results for input(s): CHOL, HDL, LDLCALC, TRIG, CHOLHDL, LDLDIRECT in the last 72 hours. Thyroid Function Tests: No results for input(s): TSH, T4TOTAL, FREET4,  T3FREE, THYROIDAB in the last 72 hours. Anemia Panel: No results for input(s): VITAMINB12, FOLATE, FERRITIN, TIBC, IRON, RETICCTPCT in the last 72 hours. Sepsis Labs: Recent Labs  Lab 10/17/20 1317  LATICACIDVEN 1.8    Recent Results (from the past 240 hour(s))  Blood culture (routine x 2)     Status: None (Preliminary result)   Collection Time: 10/17/20  1:17 PM   Specimen: BLOOD RIGHT HAND  Result Value Ref Range Status   Specimen Description BLOOD RIGHT HAND  Final   Special Requests   Final    Blood Culture results may not be optimal due to an excessive volume of blood received in culture bottles BOTTLES DRAWN AEROBIC AND ANAEROBIC   Culture   Final    NO GROWTH < 24 HOURS Performed at [facility], [address], [place],  [place] [address]    Report Status PENDING  Incomplete  Blood culture (routine x 2)     Status: None (Preliminary result)   Collection Time: 10/17/20  1:17 PM   Specimen: BLOOD LEFT FOREARM  Result Value Ref Range Status   Specimen Description BLOOD LEFT FOREARM  Final   Special Requests   Final    Blood Culture results may not be optimal due to an excessive volume of blood received in culture bottles BOTTLES DRAWN AEROBIC AND ANAEROBIC   Culture   Final    NO GROWTH < 24 HOURS Performed at [facility], [address] [place], [place] [address]    Report Status PENDING  Incomplete  Resp Panel by RT-PCR (Flu A&B, Covid) Nasopharyngeal Swab     Status: None   Collection Time: 10/17/20  1:49 PM   Specimen: Nasopharyngeal Swab; Nasopharyngeal(NP) swabs in vial transport medium  Result Value Ref Range Status   SARS Coronavirus 2 by RT PCR NEGATIVE NEGATIVE Final    Comment: (NOTE) SARS-CoV-2 target nucleic acids are NOT DETECTED.  The SARS-CoV-2 RNA is generally detectable in upper respiratory specimens during the acute phase of infection. The lowest concentration of SARS-CoV-2 viral copies this assay can detect is 138 copies/mL. A negative result does  not preclude SARS-Cov-2 infection and should not be used as the sole basis for treatment or other patient management decisions. A negative result may occur with  improper specimen collection/handling, submission of specimen other than nasopharyngeal swab, presence of viral mutation(s) within the areas targeted by this assay, and inadequate number of viral copies(<138 copies/mL). A negative result must be combined with clinical observations, patient history, and epidemiological information. The expected result is Negative.  Fact Sheet for Patients:  EntrepreneurPulse.com.au  Fact Sheet for Healthcare Providers:  IncredibleEmployment.be  This test is no t yet approved or cleared by the [place] FDA and  has been authorized for detection and/or diagnosis of SARS-CoV-2 by FDA under an Emergency Use Authorization (EUA). This EUA will remain  in effect (meaning this test can be used) for the duration of the COVID-19 declaration under Section 564(b)(1) of the Act, 21 U.S.C.section 360bbb-3(b)(1), unless the authorization is terminated  or revoked sooner.       Influenza A by PCR NEGATIVE NEGATIVE Final   Influenza B by PCR NEGATIVE NEGATIVE Final    Comment: (NOTE) The Xpert Xpress SARS-CoV-2/FLU/RSV plus assay is intended as an aid in the diagnosis of influenza from Nasopharyngeal swab specimens and should not be used as a sole basis for treatment. Nasal washings and aspirates are unacceptable for Xpert Xpress SARS-CoV-2/FLU/RSV testing.  Fact Sheet for Patients: EntrepreneurPulse.com.au  Fact Sheet for Healthcare Providers: IncredibleEmployment.be  This test is not yet approved or cleared by the [place] FDA and has been authorized for detection and/or diagnosis of SARS-CoV-2 by FDA under an Emergency Use Authorization (EUA). This EUA will remain in effect (meaning this test can be used) for the  duration of the COVID-19 declaration under Section 564(b)(1) of the Act, 21 U.S.C. section 360bbb-3(b)(1), unless the authorization is terminated or revoked.  Performed at [facility], [address], [place], [place] [address]          Radiology Studies: CT ABDOMEN PELVIS W CONTRAST  Result Date: 10/17/2020 CLINICAL DATA:  An 80 year old female presents with abdominal pain nausea and diarrhea that began yesterday. EXAM: CT ABDOMEN AND PELVIS WITH CONTRAST TECHNIQUE: Multidetector CT imaging of the abdomen and pelvis was performed using the standard  protocol following bolus administration of intravenous contrast. CONTRAST:  62mL OMNIPAQUE IOHEXOL 300 MG/ML  SOLN COMPARISON:  Comparison made with July 01, 2019. FINDINGS: Lower chest: Cardiomegaly. Pacer device in place, single lead in the RIGHT heart, heart is incompletely imaged. No pericardial effusion. Small RIGHT greater than LEFT pleural effusions. Basilar atelectasis. Hepatobiliary: Marked heterogeneity of hepatic parenchyma with "nutmeg" pattern portal vein is patent into the liver. Hepatic veins are grossly patent. Small cyst along the RIGHT hepatic margin (image 14/2) Increased biliary duct distension with 13 mm dilation of the common bile duct that is new compared to previous imaging. Mild prominence of the main pancreatic duct in the head of the pancreas is similar to the prior study. Small high-density focus at the level of the ampulla (image 27/5) this measures approximately 5 mm. The biliary tree tapers to this level and there is some peribiliary enhancement. This is diffuse throughout the dilated extrahepatic biliary tree. Gallbladder is distended mildly with some small volume pericholecystic fluid. Pancreas: Pancreatic atrophy with mild prominence of the main pancreatic duct in the head of the pancreas which is unchanged, no signs of pancreatic inflammation currently. Spleen: Normal size and contour. Adrenals/Urinary Tract: Adrenal  glands are hyperenhancing. Ptotic RIGHT kidney with small cyst in the "lower pole", transverse lie of the RIGHT kidney with similar appearance. No hydronephrosis. Small cyst in the lower pole the LEFT kidney. Also without hydronephrosis or perinephric stranding on the LEFT. Urinary bladder is collapsed limiting assessment. Stomach/Bowel: No sign of acute gastrointestinal finding. Generalized bowel edema in the setting of mild anasarca. Appendix not visible. Post appendectomy based on history in the medical record. Sigmoid diverticular disease and diverticular changes along the descending colon. Vascular/Lymphatic: Aortic atherosclerosis. No sign of aneurysm. Smooth contour of the IVC. There is no gastrohepatic or hepatoduodenal ligament lymphadenopathy. No retroperitoneal or mesenteric lymphadenopathy. No pelvic sidewall lymphadenopathy. Reproductive: Post hysterectomy.  No adnexal mass. Other: Small volume ascites in the pelvis.  Diffuse body wall edema. Musculoskeletal: No acute bone finding. No destructive bone process. Spinal degenerative changes. Degenerative changes are present in the hips bilaterally similar to prior imaging. These are severe. IMPRESSION: Increased biliary duct distension with some peribiliary enhancement. Small high-density focus at the level of the ampulla this measures approximately 5 mm. Findings raise the question of choledocholithiasis and or small obstructing lesion at the ampulla with resultant cholangitis. Sagittal images show more abrupt termination of the common bile duct than do coronal images (image 62/6) MRI/MRCP may be helpful as possible for further evaluation with HIDA scan as an initial means of further evaluating this patient if MRI is not possible. Ultimately ERCP may be warranted for further evaluation. Pericholecystic fluid, nonspecific in the setting of anasarca with only mild distension of the gallbladder. HIDA scan could be helpful to add specificity to the above  findings if there is concern for acute cholecystitis. Hepatic enhancement pattern which is more compatible with congestive hepatopathy. Correlate with laboratory evidence of heart failure as warranted. Anasarca. Sigmoid diverticular disease and diverticular changes along the descending colon. Aortic Atherosclerosis (ICD10-I70.0). Electronically Signed   By: Zetta Bills M.D.   On: 10/17/2020 10:36        Scheduled Meds:  atenolol  12.5 mg Oral BID   cycloSPORINE  1 drop Both Eyes BID   levothyroxine  100 mcg Oral q morning   loratadine  10 mg Oral Daily   pantoprazole (PROTONIX) IV  40 mg Intravenous Q24H   predniSONE  5 mg Oral q  morning   Continuous Infusions:  lactated ringers 75 mL/hr at 10/18/20 0335   meropenem (MERREM) IV 1 g (10/18/20 0959)     LOS: 1 day    Time spent: 35 minutes    Abir Craine Darleen Crocker, DO [facility]  If 7PM-7AM, please contact night-coverage [email or web address] 10/18/2020, 10:32 AM

## 2020-10-19 ENCOUNTER — Encounter (HOSPITAL_COMMUNITY)
Admission: EM | Disposition: A | Discharge status: Skilled Nursing Facility | Payer: Self-pay | Source: Home / Self Care | Attending: Internal Medicine

## 2020-10-19 ENCOUNTER — Inpatient Hospital Stay (HOSPITAL_COMMUNITY): Payer: Medicare Other | Admitting: Anesthesiology

## 2020-10-19 ENCOUNTER — Inpatient Hospital Stay (HOSPITAL_COMMUNITY): Payer: Medicare Other

## 2020-10-19 ENCOUNTER — Encounter (HOSPITAL_COMMUNITY): Payer: Self-pay | Admitting: Internal Medicine

## 2020-10-19 DIAGNOSIS — R1314 Dysphagia, pharyngoesophageal phase: Secondary | ICD-10-CM

## 2020-10-19 DIAGNOSIS — K8050 Calculus of bile duct without cholangitis or cholecystitis without obstruction: Secondary | ICD-10-CM

## 2020-10-19 DIAGNOSIS — K831 Obstruction of bile duct: Secondary | ICD-10-CM

## 2020-10-19 DIAGNOSIS — K838 Other specified diseases of biliary tract: Secondary | ICD-10-CM

## 2020-10-19 HISTORY — PX: ESOPHAGEAL DILATION: SHX303

## 2020-10-19 HISTORY — PX: ESOPHAGOGASTRODUODENOSCOPY (EGD) WITH PROPOFOL: SHX5813

## 2020-10-19 HISTORY — PX: STONE EXTRACTION WITH BASKET: SHX5318

## 2020-10-19 HISTORY — PX: ENDOSCOPIC RETROGRADE CHOLANGIOPANCREATOGRAPHY (ERCP) WITH PROPOFOL: SHX5810

## 2020-10-19 HISTORY — PX: SPHINCTEROTOMY: SHX5544

## 2020-10-19 LAB — BLOOD CULTURE ID PANEL (REFLEXED) - BCID2

## 2020-10-19 LAB — COMPREHENSIVE METABOLIC PANEL
ALT: 152 U/L — ABNORMAL HIGH (ref 0–44)
AST: 93 U/L — ABNORMAL HIGH (ref 15–41)
Albumin: 2.8 g/dL — ABNORMAL LOW (ref 3.5–5.0)
Alkaline Phosphatase: 242 U/L — ABNORMAL HIGH (ref 38–126)
Anion gap: 5 (ref 5–15)
BUN: 22 mg/dL (ref 8–23)
CO2: 24 mmol/L (ref 22–32)
Calcium: 8.5 mg/dL — ABNORMAL LOW (ref 8.9–10.3)
Chloride: 105 mmol/L (ref 98–111)
Creatinine, Ser: 0.72 mg/dL (ref 0.44–1.00)
GFR, Estimated: >60 mL/min (ref >60)
Glucose, Bld: 93 mg/dL (ref 70–99)
Potassium: 4.6 mmol/L (ref 3.5–5.1)
Sodium: 134 mmol/L — ABNORMAL LOW (ref 135–145)
Total Bilirubin: 2.1 mg/dL — ABNORMAL HIGH (ref 0.3–1.2)
Total Protein: 5 g/dL — ABNORMAL LOW (ref 6.5–8.1)

## 2020-10-19 LAB — CBC
HCT: 40.4 % (ref 36.0–46.0)
Hemoglobin: 13.4 g/dL (ref 12.0–15.0)
MCH: 32.7 pg (ref 26.0–34.0)
MCHC: 33.2 g/dL (ref 30.0–36.0)
MCV: 98.5 fL (ref 80.0–100.0)
Platelets: 124 10*3/uL — ABNORMAL LOW (ref 150–400)
RBC: 4.1 MIL/uL (ref 3.87–5.11)
RDW: 15.8 % — ABNORMAL HIGH (ref 11.5–15.5)
WBC: 12.7 10*3/uL — ABNORMAL HIGH (ref 4.0–10.5)
nRBC: 0 % (ref 0.0–0.2)

## 2020-10-19 LAB — PROTIME-INR
INR: 1.3 — ABNORMAL HIGH (ref 0.8–1.2)
Prothrombin Time: 16 seconds — ABNORMAL HIGH (ref 11.4–15.2)

## 2020-10-19 LAB — MAGNESIUM: Magnesium: 1.9 mg/dL (ref 1.7–2.4)

## 2020-10-19 SURGERY — ENDOSCOPIC RETROGRADE CHOLANGIOPANCREATOGRAPHY (ERCP) WITH PROPOFOL
Anesthesia: General

## 2020-10-19 MED ORDER — LIDOCAINE HCL (CARDIAC) PF 50 MG/5ML IV SOSY
PREFILLED_SYRINGE | INTRAVENOUS | Status: DC | PRN
  Administered 2020-10-19: 50 mg via INTRAVENOUS

## 2020-10-19 MED ORDER — PROPOFOL 10 MG/ML IV BOLUS
INTRAVENOUS | Status: DC | PRN
  Administered 2020-10-19: 100 mg via INTRAVENOUS

## 2020-10-19 MED ORDER — SODIUM CHLORIDE 0.9 % IV SOLN
INTRAVENOUS | Status: AC
  Filled 2020-10-19: qty 50

## 2020-10-19 MED ORDER — PROPOFOL 10 MG/ML IV BOLUS
INTRAVENOUS | Status: AC
  Filled 2020-10-19: qty 20

## 2020-10-19 MED ORDER — ONDANSETRON HCL 4 MG/2ML IJ SOLN
4.0000 mg | Freq: Once | INTRAMUSCULAR | Status: AC
  Administered 2020-10-19: 4 mg via INTRAVENOUS

## 2020-10-19 MED ORDER — DEXAMETHASONE SODIUM PHOSPHATE 10 MG/ML IJ SOLN
INTRAMUSCULAR | Status: AC
  Filled 2020-10-19: qty 1

## 2020-10-19 MED ORDER — CHLORHEXIDINE GLUCONATE 0.12 % MT SOLN
15.0000 mL | Freq: Once | OROMUCOSAL | Status: AC
  Administered 2020-10-19: 15 mL via OROMUCOSAL

## 2020-10-19 MED ORDER — HEPARIN (PORCINE) 25000 UT/250ML-% IV SOLN: 900.0000 [IU]/h | INTRAVENOUS | Status: DC

## 2020-10-19 MED ORDER — EPHEDRINE SULFATE 50 MG/ML IJ SOLN
INTRAMUSCULAR | Status: DC | PRN
  Administered 2020-10-19: 10 mg via INTRAVENOUS

## 2020-10-19 MED ORDER — STERILE WATER FOR IRRIGATION IR SOLN: Status: DC | PRN

## 2020-10-19 MED ORDER — FENTANYL CITRATE (PF) 250 MCG/5ML IJ SOLN
INTRAMUSCULAR | Status: AC
  Filled 2020-10-19: qty 5

## 2020-10-19 MED ORDER — LIDOCAINE HCL (PF) 2 % IJ SOLN
INTRAMUSCULAR | Status: AC
  Filled 2020-10-19: qty 5

## 2020-10-19 MED ORDER — ORAL CARE MOUTH RINSE: 15.0000 mL | Freq: Once | OROMUCOSAL | Status: AC

## 2020-10-19 MED ORDER — FENTANYL CITRATE PF 50 MCG/ML IJ SOSY: 25.0000 ug | PREFILLED_SYRINGE | INTRAMUSCULAR | Status: DC | PRN

## 2020-10-19 MED ORDER — SODIUM CHLORIDE 0.9 % IV SOLN
INTRAVENOUS | Status: DC | PRN
  Administered 2020-10-19: 18 mL

## 2020-10-19 MED ORDER — GLUCAGON HCL RDNA (DIAGNOSTIC) 1 MG IJ SOLR
INTRAMUSCULAR | Status: DC | PRN
  Administered 2020-10-19 (×3): .25 mg via INTRAVENOUS

## 2020-10-19 MED ORDER — FENTANYL CITRATE PF 50 MCG/ML IJ SOSY
PREFILLED_SYRINGE | INTRAMUSCULAR | Status: AC
  Administered 2020-10-19: 50 ug via INTRAVENOUS
  Filled 2020-10-19: qty 1

## 2020-10-19 MED ORDER — GLUCAGON HCL RDNA (DIAGNOSTIC) 1 MG IJ SOLR
INTRAMUSCULAR | Status: AC
  Filled 2020-10-19: qty 2

## 2020-10-19 MED ORDER — ONDANSETRON HCL 4 MG/2ML IJ SOLN
4.0000 mg | Freq: Once | INTRAMUSCULAR | Status: AC | PRN
  Administered 2020-10-19: 4 mg via INTRAVENOUS
  Filled 2020-10-19: qty 2

## 2020-10-19 MED ORDER — FENTANYL CITRATE (PF) 100 MCG/2ML IJ SOLN
INTRAMUSCULAR | Status: DC | PRN
  Administered 2020-10-19: 50 ug via INTRAVENOUS

## 2020-10-19 MED ORDER — SODIUM CHLORIDE 0.9 % IV SOLN: INTRAVENOUS | Status: DC

## 2020-10-19 MED ORDER — LACTATED RINGERS IV SOLN
INTRAVENOUS | Status: DC
  Administered 2020-10-19: 1000 mL via INTRAVENOUS

## 2020-10-19 MED ORDER — EPHEDRINE 5 MG/ML INJ
INTRAVENOUS | Status: AC
  Filled 2020-10-19: qty 5

## 2020-10-19 MED ORDER — SUCCINYLCHOLINE 20MG/ML (10ML) SYRINGE FOR MEDFUSION PUMP - OPTIME
INTRAMUSCULAR | Status: DC | PRN
  Administered 2020-10-19: 90 mg via INTRAVENOUS

## 2020-10-19 MED ORDER — FENTANYL CITRATE (PF) 100 MCG/2ML IJ SOLN: 50.0000 ug | INTRAMUSCULAR | Status: DC | PRN

## 2020-10-19 MED ORDER — MIDAZOLAM HCL 2 MG/2ML IJ SOLN
INTRAMUSCULAR | Status: AC
  Filled 2020-10-19: qty 2

## 2020-10-19 MED ORDER — ONDANSETRON HCL 4 MG/2ML IJ SOLN
INTRAMUSCULAR | Status: AC
  Filled 2020-10-19: qty 2

## 2020-10-19 SURGICAL SUPPLY — 29 items
BALLN CRE LF 10-12 240X5.5 (BALLOONS)
BALLN DILATOR CRE 12-15 240 (BALLOONS)
BALLN DILATOR CRE 15-18 240 (BALLOONS) IMPLANT
BALLN DILATOR CRE 18-20 240 (BALLOONS) IMPLANT
BALLN DILATOR CRE WIREGUIDE (BALLOONS)
BALLOON CRE LF 10-12 240X5.5 (BALLOONS) IMPLANT
BALLOON DILATOR CRE 12-15 240 (BALLOONS) IMPLANT
BALLOON DILATOR CRE WIREGUIDE (BALLOONS) IMPLANT
BLOCK BITE 60FR ADLT L/F BLUE (MISCELLANEOUS) IMPLANT
ELECT REM PT RETURN 9FT ADLT (ELECTROSURGICAL)
ELECTRODE REM PT RTRN 9FT ADLT (ELECTROSURGICAL) IMPLANT
FLOOR PAD 36X40 (MISCELLANEOUS)
FORCEP COLD BIOPSY (CUTTING FORCEPS) IMPLANT
FORCEPS BIOP RAD 4 LRG CAP 4 (CUTTING FORCEPS) IMPLANT
FORMALIN 10 PREFIL 20ML (MISCELLANEOUS) IMPLANT
KIT ENDO PROCEDURE PEN (KITS) ×2 IMPLANT
MANIFOLD NEPTUNE II (INSTRUMENTS) ×2 IMPLANT
NEEDLE SCLEROTHERAPY 25GX240 (NEEDLE) IMPLANT
PAD FLOOR 36X40 (MISCELLANEOUS) IMPLANT
PROBE APC STR FIRE (PROBE) IMPLANT
PROBE INJECTION GOLD (MISCELLANEOUS)
PROBE INJECTION GOLD 7FR (MISCELLANEOUS) IMPLANT
SNARE ROTATE MED OVAL 20MM (MISCELLANEOUS) IMPLANT
SNARE SHORT THROW 13M SML OVAL (MISCELLANEOUS) ×2 IMPLANT
SYR INFLATE BILIARY GAUGE (MISCELLANEOUS) IMPLANT
SYR INFLATION 60ML (SYRINGE) IMPLANT
TUBING INSUFFLATOR CO2MPACT (TUBING) ×2 IMPLANT
TUBING IRRIGATION ENDOGATOR (MISCELLANEOUS) IMPLANT
WATER STERILE IRR 1000ML POUR (IV SOLUTION) IMPLANT

## 2020-10-19 NOTE — Anesthesia Postprocedure Evaluation (Signed)
Anesthesia Post Note  Patient: Angela Wong  Procedure(s) Performed: ENDOSCOPIC RETROGRADE CHOLANGIOPANCREATOGRAPHY (ERCP) WITH PROPOFOL ESOPHAGOGASTRODUODENOSCOPY (EGD) WITH PROPOFOL ESOPHAGEAL DILATION SPHINCTEROTOMY STONE EXTRACTION WITH BASKET  Patient location during evaluation: Phase II Anesthesia Type: General Level of consciousness: awake Pain management: pain level controlled Vital Signs Assessment: post-procedure vital signs reviewed and stable Respiratory status: spontaneous breathing and respiratory function stable Cardiovascular status: blood pressure returned to baseline and stable Postop Assessment: no headache and no apparent nausea or vomiting Anesthetic complications: no Comments: Late entry   No notable events documented.   Last Vitals:  Vitals:   10/19/20 1245 10/19/20 1305  BP: 136/77 132/84  Pulse: (!) 58 63  Resp: 17 17  Temp:  36.4 C  SpO2: 94% 95%    Last Pain:  Vitals:   10/19/20 1305  TempSrc: Oral  PainSc: [place]

## 2020-10-19 NOTE — Anesthesia Procedure Notes (Signed)
Procedure Name: Intubation Date/Time: 10/19/2020 10:59 AM Performed by: Ollen Bowl, CRNA Pre-anesthesia Checklist: Patient identified, Patient being monitored, Timeout performed, Emergency Drugs available and Suction available Patient Re-evaluated:Patient Re-evaluated prior to induction Oxygen Delivery Method: Circle system utilized Preoxygenation: Pre-oxygenation with 100% oxygen Induction Type: IV induction Ventilation: Mask ventilation without difficulty Laryngoscope Size: Mac and 3 Grade View: Grade I Tube type: Oral Tube size: 7.0 mm Number of attempts: 1 Airway Equipment and Method: Stylet Placement Confirmation: ETT inserted through vocal cords under direct vision, positive ETCO2 and breath sounds checked- equal and bilateral Secured at: 21 cm Tube secured with: Tape Dental Injury: Teeth and Oropharynx as per pre-operative assessment  Comments: Head maintained in patient's natural flexed position.

## 2020-10-19 NOTE — Progress Notes (Signed)
PROGRESS NOTE    Angela Wong  GYI:[id number] DOB: February 06, 1940 DOA: 10/17/2020 PCP: [name], FNP   Brief Narrative:   Angela Wong is a 81 y.o. female with medical history significant for atrial fibrillation on warfarin, sick sinus syndrome with pacemaker, hypertension, congestive cardiomyopathy, prior NSTEMI with thrombus in distal PLA secondary to subtherapeutic INR, Sjogren's syndrome on chronic prednisone, hypothyroidism, and COPD who presented to the ED with worsening nausea, vomiting, diarrhea that appears to have began over the last few days.  Patient was admitted with acute cholangitis with choledocholithiasis and is awaiting ERCP today as INR levels are now subtherapeutic.  Assessment & Plan:   Active Problems:   Choledocholithiasis   Acute choledocholithiasis with cholangitis -ERCP per GI -Currently n.p.o. -Symptomatic management -Trend LFTs with downward trend noted -Continue on Merrem for empiric coverage -Continue IV fluid   Transaminitis secondary to above -Now noted to be decreasing -Continue to trend -Plan for ERCP 10/6   Recent dysuria -Continue on Merrem as noted above -UA with no signs of UTI   History of atrial fibrillation -INR currently subtherapeutic at 1.3 to allow for ERCP -Warfarin currently discontinued and heparin drip will be used to bridge once INR less than 2 -Monitor on telemetry -Continue atenolol -Currently rate controlled   History of COPD -No acute bronchospasms -Breathing treatments as needed   History of hypothyroidism -Continue levothyroxine   History of Sjogren's syndrome -Continue on prednisone and Restasis     DVT prophylaxis: Heparin drip to allow bridging after ERCP completed Code Status: Full Family Communication: None at bedside Disposition Plan:  Status is: Inpatient   Remains inpatient appropriate because:Ongoing diagnostic testing needed not appropriate for outpatient work up, IV treatments  appropriate due to intensity of illness or inability to take PO, and Inpatient level of care appropriate due to severity of illness   Dispo: The patient is from: Home              Anticipated d/c is to: Home              Patient currently is not medically stable to d/c.              Difficult to place patient No     Skin Assessment:   I have examined the patient's skin and I agree with the wound assessment as performed by the wound care RN as outlined below:   Pressure Injury 06/22/19 Sacrum Left Stage 2 -  Partial thickness loss of dermis presenting as a shallow open injury with a red, pink wound bed without slough. (Active)  06/22/19 2102  Location: Sacrum  Location Orientation: Left  Staging: Stage 2 -  Partial thickness loss of dermis presenting as a shallow open injury with a red, pink wound bed without slough.  Wound Description (Comments):   Present on Admission: Yes      Consultants:  GI   Procedures:  See below  Antimicrobials:  Anti-infectives (From admission, onward)    Start     Dose/Rate Route Frequency Ordered Stop   10/17/20 2200  meropenem (MERREM) 1 g in sodium chloride 0.9 % 100 mL IVPB        1 g 200 mL/hr over 30 Minutes Intravenous Every 12 hours 10/17/20 1544     10/17/20 1315  meropenem (MERREM) 1 g in sodium chloride 0.9 % 100 mL IVPB        1 g 200 mL/hr over 30 Minutes Intravenous  Once 10/17/20 1307  10/17/20 1518       Subjective: Patient seen and evaluated today with complaints of ongoing pain in her abdomen as well as her back.  She is tearful and anxious regarding upcoming procedure.  Objective: Vitals:   10/18/20 2000 10/19/20 0100 10/19/20 0333 10/19/20 0527  BP: 121/81 125/86 119/83 127/85  Pulse: 63 (!) 58 (!) 59 61  Resp:    15  Temp: 98.6 F (37 C)   97.7 F (36.5 C)  TempSrc: Oral   Oral  SpO2: 98%   96%  Weight:      Height:        Intake/Output Summary (Last 24 hours) at 10/19/2020 0914 Last data filed at 10/19/2020  0825 Gross per 24 hour  Intake 2273.62 ml  Output 300 ml  Net 1973.62 ml   Filed Weights   10/17/20 0755 10/18/20 0318  Weight: 49.4 kg 58.3 kg    Examination:  General exam: Appears mildly anxious Respiratory system: Clear to auscultation. Respiratory effort normal. Cardiovascular system: S1 & S2 heard, RRR.  Gastrointestinal system: Abdomen is soft Central nervous system: Alert and awake Extremities: No edema Skin: No significant lesions noted Psychiatry: Flat affect.    Data Reviewed: I have personally reviewed following labs and imaging studies  CBC: Recent Labs  Lab 10/17/20 0833 10/18/20 0528 10/19/20 0503  WBC 16.3* 19.9* 12.7*  NEUTROABS 15.3*  --   --   HGB 14.2 12.8 13.4  HCT 45.2 40.2 40.4  MCV 100.4* 99.8 98.5  PLT 162 121* 287*   Basic Metabolic Panel: Recent Labs  Lab 10/17/20 0833 10/18/20 0528 10/19/20 0503  NA 135 133* 134*  K 3.6 4.6 4.6  CL 101 102 105  CO2 28 25 24   GLUCOSE 87 72 93  BUN 14 17 22   CREATININE 0.73 0.74 0.72  CALCIUM 8.5* 8.1* 8.5*  MG  --  1.8 1.9   GFR: Estimated Creatinine Clearance: 48.4 mL/min (by C-G formula based on SCr of 0.72 mg/dL). Liver Function Tests: Recent Labs  Lab 10/17/20 0833 10/18/20 0528 10/19/20 0503  AST 633* 189* 93*  ALT 288* 204* 152*  ALKPHOS 269* 217* 242*  BILITOT 2.5* 2.6* 2.1*  PROT 5.3* 4.9* 5.0*  ALBUMIN 3.3* 2.8* 2.8*   Recent Labs  Lab 10/17/20 0833  LIPASE 27   No results for input(s): AMMONIA in the last 168 hours. Coagulation Profile: Recent Labs  Lab 10/17/20 0833 10/18/20 0528 10/19/20 0503  INR 3.3* 3.5* 1.3*   Cardiac Enzymes: No results for input(s): CKTOTAL, CKMB, CKMBINDEX, TROPONINI in the last 168 hours. BNP (last 3 results) No results for input(s): PROBNP in the last 8760 hours. HbA1C: No results for input(s): HGBA1C in the last 72 hours. CBG: No results for input(s): GLUCAP in the last 168 hours. Lipid Profile: No results for input(s): CHOL,  HDL, LDLCALC, TRIG, CHOLHDL, LDLDIRECT in the last 72 hours. Thyroid Function Tests: No results for input(s): TSH, T4TOTAL, FREET4, T3FREE, THYROIDAB in the last 72 hours. Anemia Panel: No results for input(s): VITAMINB12, FOLATE, FERRITIN, TIBC, IRON, RETICCTPCT in the last 72 hours. Sepsis Labs: Recent Labs  Lab 10/17/20 1317  LATICACIDVEN 1.8    Recent Results (from the past 240 hour(s))  Blood culture (routine x 2)     Status: None (Preliminary result)   Collection Time: 10/17/20  1:17 PM   Specimen: BLOOD RIGHT HAND  Result Value Ref Range Status   Specimen Description BLOOD RIGHT HAND  Final   Special Requests  Final    Blood Culture results may not be optimal due to an excessive volume of blood received in culture bottles BOTTLES DRAWN AEROBIC AND ANAEROBIC   Culture   Final    NO GROWTH 2 DAYS Performed at [facility], [address] [place], [place] [address]    Report Status PENDING  Incomplete  Blood culture (routine x 2)     Status: None (Preliminary result)   Collection Time: 10/17/20  1:17 PM   Specimen: BLOOD LEFT FOREARM  Result Value Ref Range Status   Specimen Description BLOOD LEFT FOREARM  Final   Special Requests   Final    Blood Culture results may not be optimal due to an excessive volume of blood received in culture bottles BOTTLES DRAWN AEROBIC AND ANAEROBIC   Culture   Final    NO GROWTH 2 DAYS Performed at [facility], [address] [place], [place] [address]    Report Status PENDING  Incomplete  Resp Panel by RT-PCR (Flu A&B, Covid) Nasopharyngeal Swab     Status: None   Collection Time: 10/17/20  1:49 PM   Specimen: Nasopharyngeal Swab; Nasopharyngeal(NP) swabs in vial transport medium  Result Value Ref Range Status   SARS Coronavirus 2 by RT PCR NEGATIVE NEGATIVE Final    Comment: (NOTE) SARS-CoV-2 target nucleic acids are NOT DETECTED.  The SARS-CoV-2 RNA is generally detectable in upper respiratory specimens during the acute phase of  infection. The lowest concentration of SARS-CoV-2 viral copies this assay can detect is 138 copies/mL. A negative result does not preclude SARS-Cov-2 infection and should not be used as the sole basis for treatment or other patient management decisions. A negative result may occur with  improper specimen collection/handling, submission of specimen other than nasopharyngeal swab, presence of viral mutation(s) within the areas targeted by this assay, and inadequate number of viral copies(<138 copies/mL). A negative result must be combined with clinical observations, patient history, and epidemiological information. The expected result is Negative.  Fact Sheet for Patients:  EntrepreneurPulse.com.au  Fact Sheet for Healthcare Providers:  IncredibleEmployment.be  This test is no t yet approved or cleared by the [place] FDA and  has been authorized for detection and/or diagnosis of SARS-CoV-2 by FDA under an Emergency Use Authorization (EUA). This EUA will remain  in effect (meaning this test can be used) for the duration of the COVID-19 declaration under Section 564(b)(1) of the Act, 21 U.S.C.section 360bbb-3(b)(1), unless the authorization is terminated  or revoked sooner.       Influenza A by PCR NEGATIVE NEGATIVE Final   Influenza B by PCR NEGATIVE NEGATIVE Final    Comment: (NOTE) The Xpert Xpress SARS-CoV-2/FLU/RSV plus assay is intended as an aid in the diagnosis of influenza from Nasopharyngeal swab specimens and should not be used as a sole basis for treatment. Nasal washings and aspirates are unacceptable for Xpert Xpress SARS-CoV-2/FLU/RSV testing.  Fact Sheet for Patients: EntrepreneurPulse.com.au  Fact Sheet for Healthcare Providers: IncredibleEmployment.be  This test is not yet approved or cleared by the [place] FDA and has been authorized for detection and/or diagnosis of SARS-CoV-2  by FDA under an Emergency Use Authorization (EUA). This EUA will remain in effect (meaning this test can be used) for the duration of the COVID-19 declaration under Section 564(b)(1) of the Act, 21 U.S.C. section 360bbb-3(b)(1), unless the authorization is terminated or revoked.  Performed at [facility], [address], [place], [place] [address]  Radiology Studies: CT ABDOMEN PELVIS W CONTRAST  Result Date: 10/17/2020 CLINICAL DATA:  An 80 year old female presents with abdominal pain nausea and diarrhea that began yesterday. EXAM: CT ABDOMEN AND PELVIS WITH CONTRAST TECHNIQUE: Multidetector CT imaging of the abdomen and pelvis was performed using the standard protocol following bolus administration of intravenous contrast. CONTRAST:  27mL OMNIPAQUE IOHEXOL 300 MG/ML  SOLN COMPARISON:  Comparison made with July 01, 2019. FINDINGS: Lower chest: Cardiomegaly. Pacer device in place, single lead in the RIGHT heart, heart is incompletely imaged. No pericardial effusion. Small RIGHT greater than LEFT pleural effusions. Basilar atelectasis. Hepatobiliary: Marked heterogeneity of hepatic parenchyma with "nutmeg" pattern portal vein is patent into the liver. Hepatic veins are grossly patent. Small cyst along the RIGHT hepatic margin (image 14/2) Increased biliary duct distension with 13 mm dilation of the common bile duct that is new compared to previous imaging. Mild prominence of the main pancreatic duct in the head of the pancreas is similar to the prior study. Small high-density focus at the level of the ampulla (image 27/5) this measures approximately 5 mm. The biliary tree tapers to this level and there is some peribiliary enhancement. This is diffuse throughout the dilated extrahepatic biliary tree. Gallbladder is distended mildly with some small volume pericholecystic fluid. Pancreas: Pancreatic atrophy with mild prominence of the main pancreatic duct in the head of the pancreas which is  unchanged, no signs of pancreatic inflammation currently. Spleen: Normal size and contour. Adrenals/Urinary Tract: Adrenal glands are hyperenhancing. Ptotic RIGHT kidney with small cyst in the "lower pole", transverse lie of the RIGHT kidney with similar appearance. No hydronephrosis. Small cyst in the lower pole the LEFT kidney. Also without hydronephrosis or perinephric stranding on the LEFT. Urinary bladder is collapsed limiting assessment. Stomach/Bowel: No sign of acute gastrointestinal finding. Generalized bowel edema in the setting of mild anasarca. Appendix not visible. Post appendectomy based on history in the medical record. Sigmoid diverticular disease and diverticular changes along the descending colon. Vascular/Lymphatic: Aortic atherosclerosis. No sign of aneurysm. Smooth contour of the IVC. There is no gastrohepatic or hepatoduodenal ligament lymphadenopathy. No retroperitoneal or mesenteric lymphadenopathy. No pelvic sidewall lymphadenopathy. Reproductive: Post hysterectomy.  No adnexal mass. Other: Small volume ascites in the pelvis.  Diffuse body wall edema. Musculoskeletal: No acute bone finding. No destructive bone process. Spinal degenerative changes. Degenerative changes are present in the hips bilaterally similar to prior imaging. These are severe. IMPRESSION: Increased biliary duct distension with some peribiliary enhancement. Small high-density focus at the level of the ampulla this measures approximately 5 mm. Findings raise the question of choledocholithiasis and or small obstructing lesion at the ampulla with resultant cholangitis. Sagittal images show more abrupt termination of the common bile duct than do coronal images (image 62/6) MRI/MRCP may be helpful as possible for further evaluation with HIDA scan as an initial means of further evaluating this patient if MRI is not possible. Ultimately ERCP may be warranted for further evaluation. Pericholecystic fluid, nonspecific in the  setting of anasarca with only mild distension of the gallbladder. HIDA scan could be helpful to add specificity to the above findings if there is concern for acute cholecystitis. Hepatic enhancement pattern which is more compatible with congestive hepatopathy. Correlate with laboratory evidence of heart failure as warranted. Anasarca. Sigmoid diverticular disease and diverticular changes along the descending colon. Aortic Atherosclerosis (ICD10-I70.0). Electronically Signed   By: Zetta Bills M.D.   On: 10/17/2020 10:36        Scheduled Meds:  atenolol  12.5 mg Oral BID   cycloSPORINE  1 drop Both Eyes BID   levothyroxine  100 mcg Oral q morning   loratadine  10 mg Oral Daily   pantoprazole (PROTONIX) IV  40 mg Intravenous Q24H   predniSONE  5 mg Oral q morning   Continuous Infusions:  lactated ringers 75 mL/hr at 10/18/20 2034   meropenem (MERREM) IV 1 g (10/18/20 2133)     LOS: 2 days    Time spent: 35 minutes    Oronde Hallenbeck Darleen Crocker, DO [facility]  If 7PM-7AM, please contact night-coverage [email or web address] 10/19/2020, 9:14 AM

## 2020-10-19 NOTE — Progress Notes (Signed)
ANTICOAGULATION CONSULT NOTE -   Pharmacy Consult for Heparin Indication: atrial fibrillation  Allergies  Allergen Reactions   Cephalosporins Hives and Shortness Of Breath   Ciprofloxacin Hives and Shortness Of Breath   Diltiazem Shortness Of Breath    Swollen throat    Doxycycline Hives and Shortness Of Breath   Horse-Derived Products Anaphylaxis   Ketek [Telithromycin] Palpitations    Chest discomfort   Nitrofuran Derivatives Anaphylaxis and Hives    blisters   Nitrous Oxide Nausea And Vomiting    Severe due to Sjogrens (Auto-Immune Disease)   Other Anaphylaxis    ALLERGY TO HORSE SERUM   Penicillins Hives and Shortness Of Breath        Pentazocine Other (See Comments)    Other reaction(s): Mental Status Changes (intolerance) Altered Mental Status  Altered Mental Status  Altered Mental Status    Sulfa Antibiotics Hives and Shortness Of Breath   Trovan [Alatrofloxacin] Palpitations, Other (See Comments) and Anaphylaxis    Chest pain, dizziness, irregular pulse   Vitamin B12     Confusion and shaking    Zinc Gelatin [Zinc] Anaphylaxis   Amlodipine Swelling   Calcium Channel Blockers     Respiratory distress   Carvedilol Other (See Comments)    Dizziness, "joint pain, depression"   Clindamycin/Lincomycin     CP, lock jaw   Codeine Nausea Only   Cymbalta [Duloxetine Hcl] Swelling   Diovan [Valsartan] Swelling   Lisinopril Swelling   Sertraline Other (See Comments)    confusion   Tramadol Nausea Only   Loteprednol Etabonate Rash    Patient Measurements: Height: 5\' 4"  (162.6 cm) Weight: 58.3 kg (128 lb 8.5 oz) IBW/kg (Calculated) : 54.7 HEPARIN DW (KG): 49.4   Vital Signs: Temp: 97.7 F (36.5 C) (10/06 0527) Temp Source: Oral (10/06 0527) BP: 127/85 (10/06 0527) Pulse Rate: 61 (10/06 0527)  Labs: Recent Labs    10/17/20 0833 10/17/20 1317 10/17/20 1652 10/18/20 0528 10/19/20 0503  HGB 14.2  --   --  12.8 13.4  HCT 45.2  --   --  40.2 40.4  PLT  162  --   --  121* 124*  LABPROT 33.6*  --   --  35.3* 16.0*  INR 3.3*  --   --  3.5* 1.3*  CREATININE 0.73  --   --  0.74 0.72  TROPONINIHS  --  18* 19*  --   --      Estimated Creatinine Clearance: 48.4 mL/min (by C-G formula based on SCr of 0.72 mg/dL).   Medical History: Past Medical History:  Diagnosis Date   A-fib ([place])    Asthma    Cerebral vasculitis    Congestive dilated cardiomyopathy (HCC)    COPD (chronic obstructive pulmonary disease) (HCC)    Fibromyalgia    Gastric polyp    GERD (gastroesophageal reflux disease)    Hiatal hernia    Hyperparathyroidism (HCC)    IBS (irritable bowel syndrome)    MVP (mitral valve prolapse)    Osteoarthritis    Ovarian cancer (HCC)    lymph node removal with hysterectomy   Raynaud's disease    RLS (restless legs syndrome)    Situational depression    Sjogren's syndrome (HCC)    Vasculitis (HCC)    Vitamin D deficiency     Medications:  See med rec  Assessment: 80 y.o. female with medical history significant for atrial fibrillation on warfarin, sick sinus syndrome with pacemaker, hypertension, congestive cardiomyopathy, prior NSTEMI with thrombus  in distal PLA secondary to subtherapeutic INR. Current INR is 3.3, supratherapeutic. CT of abdomen suspicious for choledocholithiasis and cholangitis. GI to see for possible ERCP. Last dose of coumadin was 10/3 at 2100. Will start heparin when INR<2.  INR 1.3- subtherapeutic now, can start heparin.  Goal of Therapy:  Heparin level 0.3-0.7 units/ml Monitor platelets by anticoagulation protocol: Yes   Plan:  Start heparin infusion at 900 units/hr Check anti-Xa level in ~8 hours and daily while on heparin Continue to monitor H&H and platelets  Margot Ables, PharmD Clinical Pharmacist 10/19/2020 9:11 AM

## 2020-10-19 NOTE — Progress Notes (Signed)
PHARMACY - PHYSICIAN COMMUNICATION CRITICAL VALUE ALERT - BLOOD CULTURE IDENTIFICATION (BCID)  Angela Wong is an 80 y.o. female who presented to [facility] on 10/17/2020 with a chief complaint of diarrhea  Assessment:  1 out of 4 bottles so far, gram - rods, idenitifed as e coli but no resistance information available yet  (include suspected source if known)  Name of physician (or Provider) Contacted: Dr Manuella Ghazi  Current antibiotics: merrem  Changes to prescribed antibiotics recommended:  Patient is on recommended antibiotics - No changes needed  Results for orders placed or performed during the hospital encounter of 10/17/20  Blood Culture ID Panel (Reflexed) (Collected: 10/17/2020  1:17 PM)  Result Value Ref Range   Enterococcus faecalis NOT DETECTED NOT DETECTED   Enterococcus Faecium NOT DETECTED NOT DETECTED   Listeria monocytogenes NOT DETECTED NOT DETECTED   Staphylococcus species NOT DETECTED NOT DETECTED   Staphylococcus aureus (BCID) NOT DETECTED NOT DETECTED   Staphylococcus epidermidis NOT DETECTED NOT DETECTED   Staphylococcus lugdunensis NOT DETECTED NOT DETECTED   Streptococcus species NOT DETECTED NOT DETECTED   Streptococcus agalactiae NOT DETECTED NOT DETECTED   Streptococcus pneumoniae NOT DETECTED NOT DETECTED   Streptococcus pyogenes NOT DETECTED NOT DETECTED   A.calcoaceticus-baumannii NOT DETECTED NOT DETECTED   Bacteroides fragilis NOT DETECTED NOT DETECTED   Enterobacterales DETECTED (A) NOT DETECTED   Enterobacter cloacae complex NOT DETECTED NOT DETECTED   Escherichia coli DETECTED (A) NOT DETECTED   Klebsiella aerogenes NOT DETECTED NOT DETECTED   Klebsiella oxytoca NOT DETECTED NOT DETECTED   Klebsiella pneumoniae NOT DETECTED NOT DETECTED   Proteus species NOT DETECTED NOT DETECTED   Salmonella species NOT DETECTED NOT DETECTED   Serratia marcescens NOT DETECTED NOT DETECTED   Haemophilus influenzae NOT DETECTED NOT DETECTED   Neisseria  meningitidis NOT DETECTED NOT DETECTED   Pseudomonas aeruginosa NOT DETECTED NOT DETECTED   Stenotrophomonas maltophilia NOT DETECTED NOT DETECTED   Candida albicans NOT DETECTED NOT DETECTED   Candida auris NOT DETECTED NOT DETECTED   Candida glabrata NOT DETECTED NOT DETECTED   Candida krusei NOT DETECTED NOT DETECTED   Candida parapsilosis NOT DETECTED NOT DETECTED   Candida tropicalis NOT DETECTED NOT DETECTED   Cryptococcus neoformans/gattii NOT DETECTED NOT DETECTED   CTX-M ESBL NOT DETECTED NOT DETECTED   Carbapenem resistance IMP NOT DETECTED NOT DETECTED   Carbapenem resistance KPC NOT DETECTED NOT DETECTED   Carbapenem resistance NDM NOT DETECTED NOT DETECTED   Carbapenem resist OXA 48 LIKE NOT DETECTED NOT DETECTED   Carbapenem resistance VIM NOT DETECTED NOT DETECTED    Uriel Dowding 10/19/2020  2:29 PM

## 2020-10-19 NOTE — Progress Notes (Signed)
Patient briefly seen. VSS. Labs reviewed. WBC and LFTs improved. INR 1.3. Discussed with patient and Dr. Laural Golden. Plans for ERCP/EGD/ED today with Dr. Laural Golden. Regarding ERCP: The risks, benefits, limitations, alternatives, and imponderables have been reviewed with the patient. I specifically discussed a 1 in 10 chance of pancreatitis, reaction to medications, bleeding, perforation and the possibility of a failed ERCP. Potential for sphincterotomy and stent placement also reviewed. Questions have been answered. Regarding EGD/ED, I have discussed the risks, alternatives, benefits with regards to but not limited to the risk of reaction to medication, bleeding, infection, perforation and the patient is agreeable to proceed. Written consent to be obtained.  Laureen Ochs. Bernarda Caffey [facility] [phone] 10/6/20228:19 AM

## 2020-10-19 NOTE — Anesthesia Preprocedure Evaluation (Signed)
Anesthesia Evaluation  Patient identified by MRN, date of birth, ID band Patient awake    Reviewed: Allergy & Precautions, H&P , NPO status , Patient's Chart, lab work & pertinent test results, reviewed documented beta blocker date and time   Airway Mallampati: II  TM Distance: >3 FB Neck ROM: full    Dental no notable dental hx. (+) Teeth Intact   Pulmonary asthma , COPD,    Pulmonary exam normal breath sounds clear to auscultation       Cardiovascular Exercise Tolerance: Good hypertension, + CAD and + Past MI  + dysrhythmias Atrial Fibrillation + pacemaker  Rhythm:regular Rate:Normal     Neuro/Psych PSYCHIATRIC DISORDERS Depression  Neuromuscular disease    GI/Hepatic Neg liver ROS, hiatal hernia, GERD  Medicated,  Endo/Other  Hypothyroidism   Renal/GU negative Renal ROS  negative genitourinary   Musculoskeletal   Abdominal   Peds  Hematology  (+) Blood dyscrasia, anemia ,   Anesthesia Other Findings Pt. With Sjogrens. Bridging anticoagulation with Coumadin.  1. Left ventricular ejection fraction, by estimation, is 50%. The left  ventricle has low normal function. The left ventricle demonstrates global  hypokinesis. There is mild left ventricular hypertrophy. Left ventricular  diastolic parameters are  indeterminate.  2. Right ventricular systolic function is normal. The right ventricular  size is normal. There is normal pulmonary artery systolic pressure. The  estimated right ventricular systolic pressure is 10.9 mmHg.  3. Left atrial size was moderately dilated.  4. The mitral valve is grossly normal with mild annular calcification.  Mild mitral valve regurgitation.  5. The aortic valve is tricuspid. Aortic valve regurgitation is not  visualized. Mild aortic valve sclerosis is present, with no evidence of  aortic valve stenosis.  6. The inferior vena cava is normal in size with greater than 50%   respiratory variability, suggesting right atrial pressure of 3 mmHg.  Reproductive/Obstetrics negative OB ROS                             Anesthesia Physical Anesthesia Plan  ASA: 3 and emergent  Anesthesia Plan: General and General ETT   Post-op Pain Management:    Induction:   PONV Risk Score and Plan: Ondansetron  Airway Management Planned:   Additional Equipment:   Intra-op Plan:   Post-operative Plan:   Informed Consent: I have reviewed the patients History and Physical, chart, labs and discussed the procedure including the risks, benefits and alternatives for the proposed anesthesia with the patient or authorized representative who has indicated his/her understanding and acceptance.     Dental Advisory Given  Plan Discussed with: CRNA  Anesthesia Plan Comments:         Anesthesia Quick Evaluation

## 2020-10-19 NOTE — Progress Notes (Signed)
Subjective:  Patient continues to complain of pain in epigastric region right upper quadrant.  Pain is more pronounced than yesterday.  She denies chest pain or shortness of breath.  She remains with shoulder pain as well as pain in her legs and ankles. She says she is ready to proceed with endoscopic evaluation.  Current Medications: Current Facility-Administered Medications:    0.9 %  sodium chloride infusion, , Intravenous, Continuous, Mahala Menghini, PA-C   [MAR Hold] acetaminophen (TYLENOL) tablet 650 mg, 650 mg, Oral, Q6H PRN, 650 mg at 10/19/20 0248 **OR** [MAR Hold] acetaminophen (TYLENOL) suppository 650 mg, 650 mg, Rectal, Q6H PRN, Manuella Ghazi, Pratik D, DO   [MAR Hold] albuterol (VENTOLIN HFA) 108 (90 Base) MCG/ACT inhaler 1-2 puff, 1-2 puff, Inhalation, Q6H PRN, Manuella Ghazi, Pratik D, DO   [MAR Hold] atenolol (TENORMIN) tablet 12.5 mg, 12.5 mg, Oral, BID, Manuella Ghazi, Pratik D, DO, 12.5 mg at 10/19/20 0922   [MAR Hold] cycloSPORINE (RESTASIS) 0.05 % ophthalmic emulsion 1 drop, 1 drop, Both Eyes, BID, Manuella Ghazi, Pratik D, DO, 1 drop at 10/18/20 2130   [facility] Hold] fentaNYL (SUBLIMAZE) injection 12.5-50 mcg, 12.5-50 mcg, Intravenous, Q2H PRN, Manuella Ghazi, Pratik D, DO, 50 mcg at 10/19/20 1013   fentaNYL (SUBLIMAZE) injection 50 mcg, 50 mcg, Intravenous, PRN, Briant Cedar, Coralie Keens, MD   glucagon (human recombinant) (GLUCAGEN) 1 MG injection, , , ,    lactated ringers infusion, , Intravenous, Continuous, Manuella Ghazi, Pratik D, DO, Last Rate: 75 mL/hr at 10/19/20 1028, Continued from Pre-op at 10/19/20 1028   lactated ringers infusion, , Intravenous, Continuous, Kiel, Coralie Keens, MD, Last Rate: 10 mL/hr at 10/19/20 1000, 1,000 mL at 10/19/20 1000   [MAR Hold] levothyroxine (SYNTHROID) tablet 100 mcg, 100 mcg, Oral, q morning, Manuella Ghazi, Pratik D, DO, 100 mcg at 10/19/20 0559   [MAR Hold] loratadine (CLARITIN) tablet 10 mg, 10 mg, Oral, Daily, Manuella Ghazi, Pratik D, DO, 10 mg at 10/18/20 0938   [MAR Hold] meropenem (MERREM) 1 g in sodium chloride  0.9 % 100 mL IVPB, 1 g, Intravenous, Q12H, Shah, Pratik D, DO, Last Rate: 200 mL/hr at 10/18/20 2133, 1 g at 10/18/20 2133   [facility] Hold] methocarbamol (ROBAXIN) tablet 750 mg, 750 mg, Oral, Q6H PRN, Manuella Ghazi, Pratik D, DO, 750 mg at 10/18/20 2026   Omnipaque 300 mg/mL in 0.9% normal saline, , , PRN, Breleigh Carpino, Mechele Dawley, MD, Given at 10/19/20 1025   [MAR Hold] ondansetron (ZOFRAN) tablet 4 mg, 4 mg, Oral, Q6H PRN **OR** [MAR Hold] ondansetron (ZOFRAN) injection 4 mg, 4 mg, Intravenous, Q6H PRN, Manuella Ghazi, Pratik D, DO, 4 mg at 10/19/20 0136   [MAR Hold] pantoprazole (PROTONIX) injection 40 mg, 40 mg, Intravenous, Q24H, Shah, Pratik D, DO, 40 mg at 10/18/20 1413   [MAR Hold] predniSONE (DELTASONE) tablet 5 mg, 5 mg, Oral, q morning, Shah, Pratik D, DO, 5 mg at 10/18/20 0937   simethicone susp in sterile water 1000 mL irrigation, , , PRN, Brittnie Lewey U, MD, Given at 10/19/20 1025   sodium chloride 0.9 % infusion, , , ,    Objective: Blood pressure 131/87, pulse (!) 58, temperature 98.1 F (36.7 C), temperature source Oral, resp. rate 16, height _0  (1.626 m), weight 58.3 kg, SpO2 92 %. Patient is alert and in no acute distress. She has mild kyphosis. Cardiac exam with regular rhythm normal S1 and S2. No murmur or gallop noted. Lungs are clear to auscultation. Abdomen is full.  On palpation abdomen is soft.  She has moderate tenderness mid epigastric  region right upper quadrant.  No organomegaly or masses. She has trace edema around ankles but none over the shins.  Labs/studies Results:   CBC Latest Ref Rng & Units 10/19/2020 10/18/2020 10/17/2020  WBC 4.0 - 10.5 K/uL 12.7(H) 19.9(H) 16.3(H)  Hemoglobin 12.0 - 15.0 g/dL 13.4 12.8 14.2  Hematocrit 36.0 - 46.0 % 40.4 40.2 45.2  Platelets 150 - 400 K/uL 124(L) 121(L) 162    CMP Latest Ref Rng & Units 10/19/2020 10/18/2020 10/17/2020  Glucose 70 - 99 mg/dL 93 72 87  BUN 8 - 23 mg/dL _0 Creatinine 0.44 - 1.00 mg/dL 0.72 0.74 0.73  Sodium 135 - 145  mmol/L 134(L) 133(L) 135  Potassium 3.5 - 5.1 mmol/L 4.6 4.6 3.6  Chloride 98 - 111 mmol/L 105 102 101  CO2 22 - 32 mmol/L _1 Calcium 8.9 - 10.3 mg/dL 8.5(L) 8.1(L) 8.5(L)  Total Protein 6.5 - 8.1 g/dL 5.0(L) 4.9(L) 5.3(L)  Total Bilirubin 0.3 - 1.2 mg/dL 2.1(H) 2.6(H) 2.5(H)  Alkaline Phos 38 - 126 U/L 242(H) 217(H) 269(H)  AST 15 - 41 U/L 93(H) 189(H) 633(H)  ALT 0 - 44 U/L 152(H) 204(H) 288(H)    Hepatic Function Latest Ref Rng & Units 10/19/2020 10/18/2020 10/17/2020  Total Protein 6.5 - 8.1 g/dL 5.0(L) 4.9(L) 5.3(L)  Albumin 3.5 - 5.0 g/dL 2.8(L) 2.8(L) 3.3(L)  AST 15 - 41 U/L 93(H) 189(H) 633(H)  ALT 0 - 44 U/L 152(H) 204(H) 288(H)  Alk Phosphatase 38 - 126 U/L 242(H) 217(H) 269(H)  Total Bilirubin 0.3 - 1.2 mg/dL 2.1(H) 2.6(H) 2.5(H)  Bilirubin, Direct 0.0 - 0.2 mg/dL - - -    INR is 1.3.  Assessment:  #1.  Esophageal dysphagia.  She is at risk for GERD as well as peptic stricture.  She will undergo EGD with dilation if indicated prior to passing duodenoscope.  #2.  Abnormal LFTs.  She has dilated biliary system and a filling defect in the distal CBD consistent with a stone.  Coagulopathy has corrected with vitamin K.  Therefore we will proceed with ERCP with sphincterotomy.  Procedure and risks have been reviewed with patient and she is agreeable to proceed with that.  Please note that patient has been informed that if she ends up with biliary sphincterotomy she would not be able to take aspirin or anticoagulants for 72 hours. Patient risk is high given comorbidities.  #3.  Coronary artery disease.  #4.  Sjogren's syndrome.   Plan:  Proceed with esophagogastroduodenoscopy with esophageal dilation if indicated followed by ERCP with sphincterotomy and stone extraction.

## 2020-10-19 NOTE — Transfer of Care (Signed)
Immediate Anesthesia Transfer of Care Note  Patient: Angela Wong  Procedure(s) Performed: ENDOSCOPIC RETROGRADE CHOLANGIOPANCREATOGRAPHY (ERCP) WITH PROPOFOL ESOPHAGOGASTRODUODENOSCOPY (EGD) WITH PROPOFOL ESOPHAGEAL DILATION SPHINCTEROTOMY STONE EXTRACTION WITH BASKET  Patient Location: PACU  Anesthesia Type:General  Level of Consciousness: sedated  Airway & Oxygen Therapy: Patient Spontanous Breathing  Post-op Assessment: Report given to RN  Post vital signs: Reviewed and stable  Last Vitals:  Vitals Value Taken Time  BP 128/74 10/19/20 1209  Temp    Pulse 62 10/19/20 1212  Resp 17 10/19/20 1212  SpO2 98 % 10/19/20 1212  Vitals shown include unvalidated device data.  Last Pain:  Vitals:   10/19/20 1025  TempSrc:   PainSc: 8       Patients Stated Pain Goal: 0 (60/67/70 3403)  Complications: No notable events documented.

## 2020-10-19 NOTE — Progress Notes (Signed)
Brief EGD and ERCP notes  Normal EGD. Esophagus dilated by passing 54 French Maloney dilator without any difficulty. Post dilation esophageal examination reveals no mucosal disruption.  ERCP.  Ampulla Vater with short intramural segment and tiny ampullary orifice Periampullary diverticula located above orifice. Cholangiogram revealed dilated CBD and CHD.  Maximal diameter of 15 mm. Patent and large cystic duct. Intrahepatic biliary radicles not filled because of flow of contrast in the gallbladder. Biliary sphincterotomy performed. There was gush of dark bile with multiple tiny fragments of black/pigmented stones. Stone balloon extractor advanced to proximal common hepatic duct and gradually trolled through the duct.  No filling defects remained. Mild bleeding noted post sphincterotomy.  No therapy needed.  Patient tolerated both procedures well.

## 2020-10-19 NOTE — Op Note (Signed)
[facility] Patient Name: Angela Wong Procedure Date: 10/19/2020 10:02 AM MRN: [id number] Date of Birth: [birth year] Attending MD: Hildred Laser , MD CSN: [id number] Age: 80 Admit Type: Inpatient Procedure:                ERCP Indications:              Bile duct stone(s) Providers:                Hildred Laser, MD, Charlsie Quest. Theda Sers RN, RN, Nelma Rothman, Technician Referring MD:             Heath Lark, DO Medicines:                General Anesthesia Complications:            No immediate complications. Estimated Blood Loss:     Estimated blood loss was minimal. Procedure:                Pre-Anesthesia Assessment:                           - Prior to the procedure, a History and Physical                            was performed, and patient medications and                            allergies were reviewed. The patient's tolerance of                            previous anesthesia was also reviewed. The risks                            and benefits of the procedure and the sedation                            options and risks were discussed with the patient.                            All questions were answered, and informed consent                            was obtained. Prior Anticoagulants: The patient                            last took Coumadin (warfarin) 3 days prior to the                            procedure. ASA Grade Assessment: IV - A patient                            with severe systemic disease that is a constant  threat to life. After reviewing the risks and                            benefits, the patient was deemed in satisfactory                            condition to undergo the procedure.                           After obtaining informed consent, the scope was                            passed under direct vision. Throughout the                            procedure, the patient's blood pressure, pulse,  and                            oxygen saturations were monitored continuously. The                            [employer] single use                            duodenoscope was introduced through the mouth, and                            used to inject contrast into and used to inject                            contrast into the bile duct. The ERCP was                            accomplished without difficulty. The patient                            tolerated the procedure well. Scope In: 11:14:34 AM Scope Out: 99:24:26 AM Total Procedure Duration: 0 hours 32 minutes 22 seconds  Findings:      The scout film was normal. The scope was passed through the upper GI       tract without discovering UGI findings. The major papilla was adjacent       to a diverticulum. The major papilla was small. The major papilla       contained a benign appearing stenosis. significant resistance noted as       bile duct was cannulated with sphincterotome. The bile duct was deeply       cannulated with the Hydratome sphincterotome. Contrast was injected. I       personally interpreted the bile duct images. There was brisk flow of       contrast through the ducts. Image quality was excellent. Contrast       extended to the main bile duct. Contrast extended to the cystic duct.       Contrast extended to the gallbladder. Contrast extended to the hepatic       ducts.  The common bile duct and common hepatic duct were moderately       dilated and diffusely dilated, acquired. The largest diameter was 15 mm.       The biliary orifice was stenotic. This appeared benign. The biliary       sphincterotomy was extended to a total of 6 mm in length with a braided       Hydratome sphincterotome using ERBE electrocautery. The sphincterotomy       oozed blood. To determine the sphincterotomy size, the biliary tree was       swept with a 9 mm balloon starting at the upper third of the main bile        duct. Impression:               - Periampullary diverticula. 2 diverticula located                            above and on either side of ampulla of Vater                           - The major papilla appeared to be small and                            stenotic consistent with benign papillary stenosis                           - The common bile duct and common hepatic duct were                            moderately dilated, acquired.                           - A biliary sphincterotomy was performed with gush                            of contrast bile and multiple tiny pigment stones.                           - Pancreatic duct was not cannulated or filled with                            contrast. Moderate Sedation:      Per Anesthesia Care Recommendation:           - Return patient to hospital ward for ongoing care.                           - Avoid aspirin and anticoagulants for 3 days.                           - Clear liquid diet today.                           - LFTs and amylase in am. Procedure Code(s):        --- Professional ---  43262, Endoscopic retrograde                            cholangiopancreatography (ERCP); with                            sphincterotomy/papillotomy Diagnosis Code(s):        --- Professional ---                           K83.8, Other specified diseases of biliary tract                           K83.1, Obstruction of bile duct                           K80.50, Calculus of bile duct without cholangitis                            or cholecystitis without obstruction CPT copyright 2019 American [employer]. All rights reserved. The codes documented in this report are preliminary and upon coder review may  be revised to meet current compliance requirements. Hildred Laser, MD Hildred Laser, MD 10/19/2020 12:28:16 PM This report has been signed electronically. Number of Addenda: 0

## 2020-10-19 NOTE — Plan of Care (Signed)

## 2020-10-19 NOTE — Op Note (Signed)
[facility] Patient Name: Angela Wong Procedure Date: 10/19/2020 12:07 PM MRN: [id number] Date of Birth: [birth year] Attending MD: Hildred Laser , MD CSN: [id number] Age: 80 Admit Type: Inpatient Procedure:                Upper GI endoscopy Indications:              Esophageal dysphagia Providers:                Hildred Laser, MD, Selena Lesser RN, RN, Nelma Rothman, Technician Referring MD:             Heath Lark, DO Medicines:                General Anesthesia Complications:            No immediate complications. Estimated Blood Loss:     Estimated blood loss: none. Procedure:                Pre-Anesthesia Assessment:                           - Prior to the procedure, a History and Physical                            was performed, and patient medications and                            allergies were reviewed. The patient's tolerance of                            previous anesthesia was also reviewed. The risks                            and benefits of the procedure and the sedation                            options and risks were discussed with the patient.                            All questions were answered, and informed consent                            was obtained. Prior Anticoagulants: The patient                            last took Coumadin (warfarin) 3 days prior to the                            procedure. ASA Grade Assessment: IV - A patient                            with severe systemic disease that is a constant  threat to life. After reviewing the risks and                            benefits, the patient was deemed in satisfactory                            condition to undergo the procedure.                           After obtaining informed consent, the endoscope was                            passed under direct vision. Throughout the                            procedure, the patient's blood  pressure, pulse, and                            oxygen saturations were monitored continuously. The                            GIF-H190 ([id number]) scope was introduced through the                            mouth, and advanced to the second part of duodenum.                            The upper GI endoscopy was accomplished without                            difficulty. The patient tolerated the procedure                            well. The GIF-H190 ([id number]) scope was introduced                            through the and advanced to the second part of                            duodenum. Findings:      The hypopharynx was normal.      The examined esophagus was normal.      The Z-line was regular and was found 43 cm from the incisors.      No endoscopic abnormality was evident in the esophagus to explain the       patient's complaint of dysphagia. It was decided, however, to proceed       with dilation of the entire esophagus. The scope was withdrawn. Dilation       was performed with a Maloney dilator with no resistance at 84 Fr. The       dilation site was examined following endoscope reinsertion and showed no       change and no bleeding, mucosal tear or perforation.      The entire examined stomach was normal.      The duodenal bulb and second portion  of the duodenum were normal. Impression:               - Normal hypopharynx.                           - Normal esophagus.                           - Z-line regular, 43 cm from the incisors.                           - No endoscopic esophageal abnormality to explain                            patient's dysphagia. Esophagus dilated. Dilated.                           - Normal stomach.                           - Normal duodenal bulb and second portion of the                            duodenum.                           - No specimens collected. Moderate Sedation:      Per Anesthesia Care Recommendation:           - See the other  procedure note for documentation of                            additional recommendations. Procedure Code(s):        --- Professional ---                           [phone], Esophagogastroduodenoscopy, flexible,                            transoral; diagnostic, including collection of                            specimen(s) by brushing or washing, when performed                            (separate procedure)                           43450, Dilation of esophagus, by unguided sound or                            bougie, single or multiple passes Diagnosis Code(s):        --- Professional ---                           R13.14, Dysphagia, pharyngoesophageal phase CPT copyright 2019 American Medical Association. All rights reserved. The codes documented in this report are preliminary and upon coder review may  be revised to meet current compliance requirements. Hildred Laser, MD Hildred Laser, MD 10/19/2020 12:14:45 PM This report has been signed electronically. Number of Addenda: 0

## 2020-10-20 ENCOUNTER — Encounter (HOSPITAL_COMMUNITY): Payer: Self-pay | Admitting: Internal Medicine

## 2020-10-20 DIAGNOSIS — K8001 Calculus of gallbladder with acute cholecystitis with obstruction: Secondary | ICD-10-CM | POA: Diagnosis not present

## 2020-10-20 DIAGNOSIS — K8050 Calculus of bile duct without cholangitis or cholecystitis without obstruction: Secondary | ICD-10-CM | POA: Diagnosis not present

## 2020-10-20 DIAGNOSIS — R52 Pain, unspecified: Secondary | ICD-10-CM

## 2020-10-20 LAB — AMYLASE: Amylase: 43 U/L (ref 28–100)

## 2020-10-20 LAB — COMPREHENSIVE METABOLIC PANEL
ALT: 109 U/L — ABNORMAL HIGH (ref 0–44)
AST: 44 U/L — ABNORMAL HIGH (ref 15–41)
Albumin: 2.9 g/dL — ABNORMAL LOW (ref 3.5–5.0)
Alkaline Phosphatase: 284 U/L — ABNORMAL HIGH (ref 38–126)
Anion gap: 7 (ref 5–15)
BUN: 19 mg/dL (ref 8–23)
CO2: 22 mmol/L (ref 22–32)
Calcium: 8.4 mg/dL — ABNORMAL LOW (ref 8.9–10.3)
Chloride: 104 mmol/L (ref 98–111)
Creatinine, Ser: 0.66 mg/dL (ref 0.44–1.00)
GFR, Estimated: >60 mL/min (ref >60)
Glucose, Bld: 80 mg/dL (ref 70–99)
Potassium: 4.1 mmol/L (ref 3.5–5.1)
Sodium: 133 mmol/L — ABNORMAL LOW (ref 135–145)
Total Bilirubin: 3.2 mg/dL — ABNORMAL HIGH (ref 0.3–1.2)
Total Protein: 5.1 g/dL — ABNORMAL LOW (ref 6.5–8.1)

## 2020-10-20 LAB — PROTIME-INR
INR: 1.2 (ref 0.8–1.2)
Prothrombin Time: 14.7 seconds (ref 11.4–15.2)

## 2020-10-20 LAB — CBC
HCT: 41 % (ref 36.0–46.0)
Hemoglobin: 13.6 g/dL (ref 12.0–15.0)
MCH: 32.7 pg (ref 26.0–34.0)
MCHC: 33.2 g/dL (ref 30.0–36.0)
MCV: 98.6 fL (ref 80.0–100.0)
Platelets: 112 10*3/uL — ABNORMAL LOW (ref 150–400)
RBC: 4.16 MIL/uL (ref 3.87–5.11)
RDW: 15.3 % (ref 11.5–15.5)
WBC: 10.3 10*3/uL (ref 4.0–10.5)
nRBC: 0 % (ref 0.0–0.2)

## 2020-10-20 LAB — MAGNESIUM: Magnesium: 1.6 mg/dL — ABNORMAL LOW (ref 1.7–2.4)

## 2020-10-20 MED ORDER — HEPARIN (PORCINE) 25000 UT/250ML-% IV SOLN
1300.0000 [IU]/h | INTRAVENOUS | Status: AC
  Administered 2020-10-21: 900 [IU]/h via INTRAVENOUS
  Administered 2020-10-22: 1200 [IU]/h via INTRAVENOUS
  Filled 2020-10-20 (×2): qty 250

## 2020-10-20 MED ORDER — OXYCODONE-ACETAMINOPHEN 7.5-325 MG PO TABS
1.0000 | ORAL_TABLET | Freq: Four times a day (QID) | ORAL | Status: DC
Start: 2020-10-20 — End: 2020-10-28
  Administered 2020-10-20 – 2020-10-28 (×31): 1 via ORAL
  Filled 2020-10-20 (×33): qty 1

## 2020-10-20 MED ORDER — MAGNESIUM SULFATE 2 GM/50ML IV SOLN
2.0000 g | Freq: Once | INTRAVENOUS | Status: AC
  Administered 2020-10-20: 2 g via INTRAVENOUS
  Filled 2020-10-20: qty 50

## 2020-10-20 NOTE — Plan of Care (Signed)

## 2020-10-20 NOTE — Progress Notes (Signed)
ANTICOAGULATION CONSULT NOTE -   Pharmacy Consult for Heparin Indication: atrial fibrillation  Allergies  Allergen Reactions   Cephalosporins Hives and Shortness Of Breath   Ciprofloxacin Hives and Shortness Of Breath   Diltiazem Shortness Of Breath    Swollen throat    Doxycycline Hives and Shortness Of Breath   Horse-Derived Products Anaphylaxis   Ketek [Telithromycin] Palpitations    Chest discomfort   Nitrofuran Derivatives Anaphylaxis and Hives    blisters   Nitrous Oxide Nausea And Vomiting    Severe due to Sjogrens (Auto-Immune Disease)   Other Anaphylaxis    ALLERGY TO HORSE SERUM   Penicillins Hives and Shortness Of Breath        Pentazocine Other (See Comments)    Other reaction(s): Mental Status Changes (intolerance) Altered Mental Status  Altered Mental Status  Altered Mental Status    Sulfa Antibiotics Hives and Shortness Of Breath   Trovan [Alatrofloxacin] Palpitations, Other (See Comments) and Anaphylaxis    Chest pain, dizziness, irregular pulse   Vitamin B12     Confusion and shaking    Zinc Gelatin [Zinc] Anaphylaxis   Amlodipine Swelling   Calcium Channel Blockers     Respiratory distress   Carvedilol Other (See Comments)    Dizziness, "joint pain, depression"   Clindamycin/Lincomycin     CP, lock jaw   Codeine Nausea Only   Cymbalta [Duloxetine Hcl] Swelling   Diovan [Valsartan] Swelling   Lisinopril Swelling   Sertraline Other (See Comments)    confusion   Tramadol Nausea Only   Loteprednol Etabonate Rash    Patient Measurements: Height: 5\' 4"  (162.6 cm) Weight: 58.3 kg (128 lb 8.5 oz) IBW/kg (Calculated) : 54.7 HEPARIN DW (KG): 49.4   Vital Signs: Temp: 99 F (37.2 C) (10/07 0559) Temp Source: Oral (10/07 0559) BP: 148/87 (10/07 0821) Pulse Rate: 61 (10/07 0821)  Labs: Recent Labs    10/17/20 1317 10/17/20 1652 10/18/20 0528 10/18/20 0528 10/19/20 0503 10/20/20 0525  HGB  --   --  12.8   < > 13.4 13.6  HCT  --   --   40.2  --  40.4 41.0  PLT  --   --  121*  --  124* 112*  LABPROT  --   --  35.3*  --  16.0* 14.7  INR  --   --  3.5*  --  1.3* 1.2  CREATININE  --   --  0.74  --  0.72 0.66  TROPONINIHS 18* 19*  --   --   --   --    < > = values in this interval not displayed.     Estimated Creatinine Clearance: 48.4 mL/min (by C-G formula based on SCr of 0.66 mg/dL).   Medical History: Past Medical History:  Diagnosis Date   A-fib ([place])    Asthma    Cerebral vasculitis    Congestive dilated cardiomyopathy (HCC)    COPD (chronic obstructive pulmonary disease) (HCC)    Fibromyalgia    Gastric polyp    GERD (gastroesophageal reflux disease)    Hiatal hernia    Hyperparathyroidism (HCC)    IBS (irritable bowel syndrome)    MVP (mitral valve prolapse)    Osteoarthritis    Ovarian cancer (HCC)    lymph node removal with hysterectomy   Raynaud's disease    RLS (restless legs syndrome)    Situational depression    Sjogren's syndrome (HCC)    Vasculitis (HCC)    Vitamin D deficiency  Medications:  See med rec  Assessment: 80 y.o. female with medical history significant for atrial fibrillation on warfarin, sick sinus syndrome with pacemaker, hypertension, congestive cardiomyopathy, prior NSTEMI with thrombus in distal PLA secondary to subtherapeutic INR. Current INR is 3.3, supratherapeutic. CT of abdomen suspicious for choledocholithiasis and cholangitis.   MD says to restart heparin 10/8 @ 0700.  Goal of Therapy:  Heparin level 0.3-0.7 units/ml Monitor platelets by anticoagulation protocol: Yes   Plan:  Start heparin infusion at 900 units/hr 10/8 @ 0700. Check anti-Xa level in ~8 hours and daily while on heparin Continue to monitor H&H and platelets  Margot Ables, PharmD Clinical Pharmacist 10/20/2020 10:42 AM

## 2020-10-20 NOTE — Progress Notes (Signed)
Subjective: Upper abdominal pain improving. Had a lot of nausea last night without vomiting. Currently feeling pretty well sitting up in bed. No reflux symptoms over the last couple of days. Mild soreness in her throat. No issues swallowing but has only had clears.   Asked for her gallbladder to be taken out Monday rather than today.   BM this morning. No brbpr or melena that she is aware of.   Objective: Vital signs in last 24 hours: Temp:  [97.5 F (36.4 C)-99 F (37.2 C)] 99 F (37.2 C) (10/07 0559) Pulse Rate:  [57-72] 61 (10/07 0821) Resp:  [15-28] 16 (10/07 0559) BP: (128-148)/(72-87) 148/87 (10/07 0821) SpO2:  [94 %-98 %] 96 % (10/07 0559) Last BM Date: 10/19/20 General:   Alert and oriented, pleasant Head:  Normocephalic and atraumatic. Eyes:  No icterus, sclera clear. Conjuctiva pink.  Abdomen:  Bowel sounds present, soft, non-tender, non-distended. No HSM or hernias noted. No rebound or guarding. No masses appreciated  Msk:  Symmetrical without gross deformities. Normal posture. Pulses:  Normal pulses noted. Extremities:  Without clubbing or edema. Neurologic:  Alert and  oriented x4;  grossly normal neurologically. Skin:  Warm and dry, intact without significant lesions.  Cervical Nodes:  No significant cervical adenopathy. Psych:  Alert and cooperative. Normal mood and affect.  Intake/Output from previous day: 10/06 0701 - 10/07 0700 In: 1654 [I.V.:1404; IV Piggyback:250] Out: 250 [Urine:250] Intake/Output this shift: No intake/output data recorded.  Lab Results: Recent Labs    10/18/20 0528 10/19/20 0503 10/20/20 0525  WBC 19.9* 12.7* 10.3  HGB 12.8 13.4 13.6  HCT 40.2 40.4 41.0  PLT 121* 124* 112*   BMET Recent Labs    10/18/20 0528 10/19/20 0503 10/20/20 0525  NA 133* 134* 133*  K 4.6 4.6 4.1  CL 102 105 104  CO2 '25 24 22  ' GLUCOSE 72 93 80  BUN '17 22 19  ' CREATININE 0.74 0.72 0.66  CALCIUM 8.1* 8.5* 8.4*   LFT Recent Labs     10/18/20 0528 10/19/20 0503 10/20/20 0525  PROT 4.9* 5.0* 5.1*  ALBUMIN 2.8* 2.8* 2.9*  AST 189* 93* 44*  ALT 204* 152* 109*  ALKPHOS 217* 242* 284*  BILITOT 2.6* 2.1* 3.2*   PT/INR Recent Labs    10/19/20 0503 10/20/20 0525  LABPROT 16.0* 14.7  INR 1.3* 1.2    Studies/Results: DG ERCP  Result Date: 10/19/2020 CLINICAL DATA:  ERCP EXAM: ERCP TECHNIQUE: Multiple spot images obtained with the fluoroscopic device and submitted for interpretation post-procedure. FLUOROSCOPY TIME:  Fluoroscopy Time:  2 minutes 53 seconds Radiation Exposure Index (if provided by the fluoroscopic device): 70 mGy Number of Acquired Spot Images: 10 fluoroscopic loops COMPARISON:  None. FINDINGS: A total of 10 fluoroscopic loops are submitted for review. Images were taken during ERCP procedure. Images demonstrate a scope overlying the mid abdomen. The CBD appears to be catheterized. Subsequent loops demonstrate gradual opacification of the common bile duct. Eventually, there is reflux of contrast material via the cystic duct into the gallbladder. Final images demonstrate balloon sweep of the CBD. IMPRESSION: Fluoroscopic films submitted during ERCP as described. Refer to procedure report for full details. These images were submitted for radiologic interpretation only. Please see the procedural report for the amount of contrast and the fluoroscopy time utilized. Electronically Signed   By: Albin Felling M.D.   On: 10/19/2020 12:15    Assessment: Angela Wong is an 80 year old female with pmh of a fib on warfarin,  HTN, congestive cardiomyopathy, mild LVH with global hypokinesis EF 50%, sick sinus syndrome w/pacemaker, NSTEMI w/thrombus in distal PLA, Sjogren's syndrome on chronic prednisone, COPD, GERD, IBS-mixed, who presented to the hospital after new onset of nausea, abdominal pain (RUQ/flank) and fatigue on 10/17/20. She also endorsed some ongoing heartburn and occasional dysphagia. Patient denied melena,  hematochezia. BP has been soft throughout admission.  Cholangitis: In the setting of choledocholithiasis s/p ERCP 10/19/2020 with biliary diverticula, benign papillary stenosis, dilated CBD and hepatic duct s/p biliary sphincterotomy with gush of bile and multiple tiny pigmented stones.  She has been on meropenem since admission.  White blood cell count down to 10.3 today (max of 19.9 on 10/5).  AST/ALT improved to 44/109 respectively (633/288 on admission).  Mild bump in alk phos and total bilirubin today to 284 and 3.2 respectively (up from 244 and 2.1 yesterday, 158 and 1.4 on admission).  Unclear if patient may have retained stone.  Encouragingly, she is clinically feeling improved and general surgery has plans for cholecystectomy on Monday.  Would recommend intraoperative cholangiogram to ensure no retained stone.  On exam, she does have mild TTP in the RUQ and LUQ.  Amylase was normal this morning.  Advised patient to let us know of any worsening abdominal pain.  Would need to check lipase to ensure she does not develop pancreatitis post ERCP. Continue to trend LFTs and t bili; hopefully, we will see downtrend tomorrow.  GERD: Chronic.  Not on PPI outpatient due to allergies, but is on Protonix 40 mg daily and is tolerating this well.  Reports reflux symptoms are well controlled at this time.  Dysphagia: 1 year history of intermittent solid food, soft food, pill dysphagia.  Underwent EGD yesterday with normal-appearing esophagus s/p empiric dilation.  She denies any trouble swallowing at this time, but has only been on clear liquids since her procedure.  It remains to be seen if dilation was helpful.  She may have underlying motility disorder.   Plan: No anticoagulants or aspirin for 3 days following ERCP, completed 10/6.  Monitor abdominal pain. If worsening epigastric or LUQ abdominal pain, check Lipase.  Continue to trend LFTs and T bili daily. Continue meropenem. Continue clear liquid  diet. Plans for cholecystectomy on Monday with general surgery.  Recommend intraoperative cholangiogram if bilirubin and alk phos are not improving.  Continue PPI daily.    LOS: 3 days    10/20/2020, 10:30 AM   Aliene Altes, [facility]

## 2020-10-20 NOTE — Consult Note (Signed)
Reason for Consult: Cholelithiasis, status post ERCP for choledocholithiasis Referring Physician: Dr. Irving Shows is an 80 y.o. female.  HPI: Patient is an 80 year old white female with a history of atrial fibrillation on Coumadin, congestive heart failure, sick sinus syndrome with pacemaker placement, Sjogren's syndrome, and spinal stenosis who underwent an ERCP by Dr. Laural Golden yesterday for choledocholithiasis.  He was able to extract multiple stones from the dilated common bile duct.  There was also noted the patient had several stones in the gallbladder.  She is now being referred to my care for a cholecystectomy.  Patient states her abdomen feels better than he did yesterday.  She has multiple complaints including back and right shoulder pain due to spinal stenosis and rotator cuff tear.  She denies any nausea or vomiting.  Past Medical History:  Diagnosis Date   A-fib ([place])    Asthma    Cerebral vasculitis    Congestive dilated cardiomyopathy (HCC)    COPD (chronic obstructive pulmonary disease) (HCC)    Fibromyalgia    Gastric polyp    GERD (gastroesophageal reflux disease)    Hiatal hernia    Hyperparathyroidism (HCC)    IBS (irritable bowel syndrome)    MVP (mitral valve prolapse)    Osteoarthritis    Ovarian cancer (HCC)    lymph node removal with hysterectomy   Raynaud's disease    RLS (restless legs syndrome)    Situational depression    Sjogren's syndrome ([place])    Vasculitis ([place])    Vitamin D deficiency     Past Surgical History:  Procedure Laterality Date   ABDOMINAL HYSTERECTOMY  1982   with right oophorectomy   APPENDECTOMY     BREAST BIOPSY Right    x 2   BREAST SURGERY     Biopsy   CARPAL TUNNEL RELEASE Right 1980   CARPAL TUNNEL RELEASE Left 2010   x 2   CATARACT EXTRACTION Bilateral    CESAREAN SECTION     x 3   ENDOSCOPIC RETROGRADE CHOLANGIOPANCREATOGRAPHY (ERCP) WITH PROPOFOL N/A 10/19/2020   Procedure: ENDOSCOPIC RETROGRADE  CHOLANGIOPANCREATOGRAPHY (ERCP) WITH PROPOFOL;  Surgeon: Rogene Houston, MD;  Location: [facility];  Service: Endoscopy;  Laterality: N/A;   ESOPHAGEAL DILATION N/A 10/19/2020   Procedure: ESOPHAGEAL DILATION;  Surgeon: Rogene Houston, MD;  Location: [facility];  Service: Endoscopy;  Laterality: N/A;   ESOPHAGOGASTRODUODENOSCOPY (EGD) WITH PROPOFOL N/A 10/19/2020   Procedure: ESOPHAGOGASTRODUODENOSCOPY (EGD) WITH PROPOFOL;  Surgeon: Rogene Houston, MD;  Location: [facility];  Service: Endoscopy;  Laterality: N/A;   KNEE SURGERY Left 2005   LEFT HEART CATH AND CORONARY ANGIOGRAPHY N/A 07/02/2019   Procedure: LEFT HEART CATH AND CORONARY ANGIOGRAPHY;  Surgeon: Jettie Booze, MD;  Location: [place] [facility];  Service: Cardiovascular;  Laterality: N/A;   OOPHORECTOMY Left [place]  09/15/2014   REFRACTIVE SURGERY Bilateral 2014   SPHINCTEROTOMY N/A 10/19/2020   Procedure: SPHINCTEROTOMY;  Surgeon: Rogene Houston, MD;  Location: [facility];  Service: Endoscopy;  Laterality: N/A;   STONE EXTRACTION WITH BASKET N/A 10/19/2020   Procedure: STONE EXTRACTION WITH BASKET;  Surgeon: Rogene Houston, MD;  Location: [facility];  Service: Endoscopy;  Laterality: N/A;   TUBAL LIGATION      Family History  Problem Relation Age of Onset   COPD Mother    Breast cancer Mother    Allergic rhinitis Mother    Heart disease Father  No details   Kidney disease Father    Allergic rhinitis Father    Stroke Sister    Arthritis/Rheumatoid Sister    Asthma Sister    Lupus Sister    Heart attack Sister    Stroke Paternal Grandmother    Scleroderma Grandchild    Thyroid disease Other    Breast cancer Maternal Aunt        x 2    Social History:  reports that she has never smoked. She has never used smokeless tobacco. She reports that she does not drink alcohol and does not use drugs.  Allergies:  Allergies  Allergen Reactions   Cephalosporins Hives and Shortness Of Breath   Ciprofloxacin  Hives and Shortness Of Breath   Diltiazem Shortness Of Breath    Swollen throat    Doxycycline Hives and Shortness Of Breath   Horse-Derived Products Anaphylaxis   Ketek [Telithromycin] Palpitations    Chest discomfort   Nitrofuran Derivatives Anaphylaxis and Hives    blisters   Nitrous Oxide Nausea And Vomiting    Severe due to Sjogrens (Auto-Immune Disease)   Other Anaphylaxis    ALLERGY TO HORSE SERUM   Penicillins Hives and Shortness Of Breath        Pentazocine Other (See Comments)    Other reaction(s): Mental Status Changes (intolerance) Altered Mental Status  Altered Mental Status  Altered Mental Status    Sulfa Antibiotics Hives and Shortness Of Breath   Trovan [Alatrofloxacin] Palpitations, Other (See Comments) and Anaphylaxis    Chest pain, dizziness, irregular pulse   Vitamin B12     Confusion and shaking    Zinc Gelatin [Zinc] Anaphylaxis   Amlodipine Swelling   Calcium Channel Blockers     Respiratory distress   Carvedilol Other (See Comments)    Dizziness, "joint pain, depression"   Clindamycin/Lincomycin     CP, lock jaw   Codeine Nausea Only   Cymbalta [Duloxetine Hcl] Swelling   Diovan [Valsartan] Swelling   Lisinopril Swelling   Sertraline Other (See Comments)    confusion   Tramadol Nausea Only   Loteprednol Etabonate Rash    Medications: I have reviewed the patient's current medications. Prior to Admission:  Medications Prior to Admission  Medication Sig Dispense Refill Last Dose   albuterol (VENTOLIN HFA) 108 (90 Base) MCG/ACT inhaler INHALE 2 PUFFS BY MOUTH EVERY 6 HOURS AS NEEDED FOR WHEEZING (Patient taking differently: Inhale 1-2 puffs into the lungs every 6 (six) hours as needed for wheezing or shortness of breath.) 9 g 0 PRN   atenolol (TENORMIN) 25 MG tablet Take 0.5 tablets (12.5 mg total) by mouth 2 (two) times daily. 60 tablet 5 10/17/2020 at 0800   fexofenadine (ALLEGRA) 180 MG tablet Take 180 mg by mouth every morning.    10/17/2020  at 0800   hydrocortisone-pramoxine [facility]) 2.5-1 % rectal cream Place 1 application rectally See admin instructions. After each bathroom use   Past Month   levothyroxine (EUTHYROX) 50 MCG tablet Take 2 tablets (100 mcg total) by mouth every morning. 180 tablet 3 10/17/2020 at 0300   methocarbamol (ROBAXIN) 750 MG tablet Take 750 mg by mouth 4 (four) times daily.   10/17/2020   oxyCODONE-acetaminophen (PERCOCET) 7.5-325 MG tablet Take 1 tablet by mouth every 6 (six) hours as needed for severe pain.   10/16/2020   predniSONE (DELTASONE) 5 MG tablet Take 1 tablet (5 mg total) by mouth every morning. 90 tablet 1 10/17/2020 at 0800   RESTASIS 0.05 % ophthalmic  emulsion INSTILL 1 DROP INTO EACH EYE TWICE DAILY (Patient taking differently: Place 1 drop into both eyes 2 (two) times daily.) 120 each 0 10/17/2020   warfarin (COUMADIN) 1 MG tablet TAKE 4MG  MONDAY, WEDNESDAY, AND FRIDAY. TAKE 2 MG TUESDAY, THURSDAY, SATURDAY, AND SUNDAY 90 tablet 3 10/16/2020 at 2100   azithromycin (ZITHROMAX Z-PAK) 250 MG tablet As directed (Patient not taking: Reported on 10/17/2020) 6 tablet 0 Completed Course    Results for orders placed or performed during the hospital encounter of 10/17/20 (from the past 48 hour(s))  Protime-INR     Status: Abnormal   Collection Time: 10/19/20  5:03 AM  Result Value Ref Range   Prothrombin Time 16.0 (H) 11.4 - 15.2 seconds   INR 1.3 (H) 0.8 - 1.2    Comment: (NOTE) INR goal varies based on device and disease states. Performed at [facility], [address], [place], [place] [address]   CBC     Status: Abnormal   Collection Time: 10/19/20  5:03 AM  Result Value Ref Range   WBC 12.7 (H) 4.0 - 10.5 K/uL   RBC 4.10 3.87 - 5.11 MIL/uL   Hemoglobin 13.4 12.0 - 15.0 g/dL   HCT 40.4 36.0 - 46.0 %   MCV 98.5 80.0 - 100.0 fL   MCH 32.7 26.0 - 34.0 pg   MCHC 33.2 30.0 - 36.0 g/dL   RDW 15.8 (H) 11.5 - 15.5 %   Platelets 124 (L) 150 - 400 K/uL    Comment: SPECIMEN CHECKED FOR  CLOTS CONSISTENT WITH PREVIOUS RESULT REPEATED TO VERIFY    nRBC 0.0 0.0 - 0.2 %    Comment: Performed at [facility], [address] [place], [place] [address]  Magnesium     Status: None   Collection Time: 10/19/20  5:03 AM  Result Value Ref Range   Magnesium 1.9 1.7 - 2.4 mg/dL    Comment: Performed at [facility], [address], [place], [place] [address]  Comprehensive metabolic panel     Status: Abnormal   Collection Time: 10/19/20  5:03 AM  Result Value Ref Range   Sodium 134 (L) 135 - 145 mmol/L   Potassium 4.6 3.5 - 5.1 mmol/L   Chloride 105 98 - 111 mmol/L   CO2 24 22 - 32 mmol/L   Glucose, Bld 93 70 - 99 mg/dL    Comment: Glucose reference range applies only to samples taken after fasting for at least 8 hours.   BUN 22 8 - 23 mg/dL   Creatinine, Ser 0.72 0.44 - 1.00 mg/dL   Calcium 8.5 (L) 8.9 - 10.3 mg/dL   Total Protein 5.0 (L) 6.5 - 8.1 g/dL   Albumin 2.8 (L) 3.5 - 5.0 g/dL   AST 93 (H) 15 - 41 U/L   ALT 152 (H) 0 - 44 U/L   Alkaline Phosphatase 242 (H) 38 - 126 U/L   Total Bilirubin 2.1 (H) 0.3 - 1.2 mg/dL   GFR, Estimated >60 >60 mL/min    Comment: (NOTE) Calculated using the CKD-EPI Creatinine Equation (2021)    Anion gap 5 5 - 15    Comment: Performed at [facility], [address], [place], [place] [address]  Protime-INR     Status: None   Collection Time: 10/20/20  5:25 AM  Result Value Ref Range   Prothrombin Time 14.7 11.4 - 15.2 seconds   INR 1.2 0.8 - 1.2    Comment: (NOTE) INR goal varies based on device and disease states.  Performed at [facility], [address] [place], [place] [address]   Comprehensive metabolic panel     Status: Abnormal   Collection Time: 10/20/20  5:25 AM  Result Value Ref Range   Sodium 133 (L) 135 - 145 mmol/L   Potassium 4.1 3.5 - 5.1 mmol/L   Chloride 104 98 - 111 mmol/L   CO2 22 22 - 32 mmol/L   Glucose, Bld 80 70 - 99 mg/dL    Comment: Glucose reference range applies only to samples taken after fasting for  at least 8 hours.   BUN 19 8 - 23 mg/dL   Creatinine, Ser 0.66 0.44 - 1.00 mg/dL   Calcium 8.4 (L) 8.9 - 10.3 mg/dL   Total Protein 5.1 (L) 6.5 - 8.1 g/dL   Albumin 2.9 (L) 3.5 - 5.0 g/dL   AST 44 (H) 15 - 41 U/L   ALT 109 (H) 0 - 44 U/L   Alkaline Phosphatase 284 (H) 38 - 126 U/L   Total Bilirubin 3.2 (H) 0.3 - 1.2 mg/dL   GFR, Estimated >60 >60 mL/min    Comment: (NOTE) Calculated using the CKD-EPI Creatinine Equation (2021)    Anion gap 7 5 - 15    Comment: Performed at [facility], [address] [place], [place] [address]  Magnesium     Status: Abnormal   Collection Time: 10/20/20  5:25 AM  Result Value Ref Range   Magnesium 1.6 (L) 1.7 - 2.4 mg/dL    Comment: Performed at [facility], [address] [place],  [address]  CBC     Status: Abnormal   Collection Time: 10/20/20  5:25 AM  Result Value Ref Range   WBC 10.3 4.0 - 10.5 K/uL   RBC 4.16 3.87 - 5.11 MIL/uL   Hemoglobin 13.6 12.0 - 15.0 g/dL   HCT 41.0 36.0 - 46.0 %   MCV 98.6 80.0 - 100.0 fL   MCH 32.7 26.0 - 34.0 pg   MCHC 33.2 30.0 - 36.0 g/dL   RDW 15.3 11.5 - 15.5 %   Platelets 112 (L) 150 - 400 K/uL    Comment: Immature Platelet Fraction may be clinically indicated, consider ordering this additional test [id number] REPEATED TO VERIFY PLATELET COUNT CONFIRMED BY SMEAR    nRBC 0.0 0.0 - 0.2 %    Comment: Performed at [facility], [address] [place], [place] [address]  Amylase     Status: None   Collection Time: 10/20/20  5:25 AM  Result Value Ref Range   Amylase 43 28 - 100 U/L    Comment: Performed at [facility], [address] [place], [place] [address]    DG ERCP  Result Date: 10/19/2020 CLINICAL DATA:  ERCP EXAM: ERCP TECHNIQUE: Multiple spot images obtained with the fluoroscopic device and submitted for interpretation post-procedure. FLUOROSCOPY TIME:  Fluoroscopy Time:  2 minutes 53 seconds Radiation Exposure Index (if provided by the fluoroscopic device): 70 mGy Number of Acquired  Spot Images: 10 fluoroscopic loops COMPARISON:  None. FINDINGS: A total of 10 fluoroscopic loops are submitted for review. Images were taken during ERCP procedure. Images demonstrate a scope overlying the mid abdomen. The CBD appears to be catheterized. Subsequent loops demonstrate gradual opacification of the common bile duct. Eventually, there is reflux of contrast material via the cystic duct into the gallbladder. Final images demonstrate balloon sweep of the CBD. IMPRESSION: Fluoroscopic films submitted during ERCP as described. Refer to procedure report for full details. These images were submitted for  radiologic interpretation only. Please see the procedural report for the amount of contrast and the fluoroscopy time utilized. Electronically Signed   By: Albin Felling M.D.   On: 10/19/2020 12:15    ROS:  Pertinent items are noted in HPI.  Blood pressure (!) 148/87, pulse 61, temperature 99 F (37.2 C), temperature source Oral, resp. rate 16, height 5\' 4"  (1.626 m), weight 58.3 kg, SpO2 96 %. Physical Exam: White female in no acute distress Head is normocephalic, atraumatic Eyes are without scleral icterus Abdomen is soft and flat.  No point tenderness is noted.  No rigidity is noted.  Labs reviewed.  ERCP images personally reviewed  Assessment/Plan: Impression: Status post ERCP with stone extraction, cholelithiasis.  Multiple comorbidities. Plan: Patient tolerated general anesthesia for her ERCP.  She will need her gallbladder out this admission.  Her LFTs are still elevated and I would like them to somewhat normalize.  We will proceed with laparoscopic cholecystectomy on 10/23/2020.  Discussed with Dr. Manuella Ghazi.  [name] Signs 10/20/2020, 9:31 AM

## 2020-10-20 NOTE — Progress Notes (Signed)
PROGRESS NOTE    RAVLEEN RIES  MVE:[id number] DOB: Nov 14, 1940 DOA: 10/17/2020 PCP: [name], FNP   Brief Narrative:   Angela Wong is a 80 y.o. female with medical history significant for atrial fibrillation on warfarin, sick sinus syndrome with pacemaker, hypertension, congestive cardiomyopathy, prior NSTEMI with thrombus in distal PLA secondary to subtherapeutic INR, Sjogren's syndrome on chronic prednisone, hypothyroidism, and COPD who presented to the ED with worsening nausea, vomiting, diarrhea that appears to have began over the last few days.  Patient was admitted with acute cholangitis with choledocholithiasis and has undergone ERCP with stone extraction on 10/6 and is now awaiting laparoscopic cholecystectomy on 10/10.  Assessment & Plan:   Active Problems:   Choledocholithiasis   Calculus of gallbladder with acute cholecystitis and obstruction   Acute choledocholithiasis with cholangitis -Status post ERCP with stone extraction on 10/6 -General surgery planning for laparoscopic cholecystectomy 10/10 -Currently on clear liquid diet -Symptomatic management -Trend LFTs with downward trend noted -Continue on Merrem for empiric coverage -Continue IV fluid   Transaminitis secondary to above -Now noted to be decreasing -Continue to trend -Plan for ERCP 10/6  Spinal stenosis -Ongoing low back pain with oxycodone L scheduled -Fentanyl for breakthrough pain -Muscle relaxers as needed   Recent dysuria -Continue on Merrem as noted above -UA with no signs of UTI   History of atrial fibrillation -INR currently subtherapeutic at 1.3 to allow for ERCP -Warfarin currently discontinued and heparin drip will be used to bridge once INR less than 2 -Monitor on telemetry -Continue atenolol -Currently rate controlled   History of COPD -No acute bronchospasms -Breathing treatments as needed   History of hypothyroidism -Continue levothyroxine   History of  Sjogren's syndrome -Continue on prednisone and Restasis     DVT prophylaxis: Heparin drip to start 10/8, SCDs Code Status: Full Family Communication: Discussed with son Corene Cornea on phone 10/7 Disposition Plan:  Status is: Inpatient   Remains inpatient appropriate because:Ongoing diagnostic testing needed not appropriate for outpatient work up, IV treatments appropriate due to intensity of illness or inability to take PO, and Inpatient level of care appropriate due to severity of illness   Dispo: The patient is from: Home              Anticipated d/c is to: Home              Patient currently is not medically stable to d/c.              Difficult to place patient No     Skin Assessment:   I have examined the patient's skin and I agree with the wound assessment as performed by the wound care RN as outlined below:   Pressure Injury 06/22/19 Sacrum Left Stage 2 -  Partial thickness loss of dermis presenting as a shallow open injury with a red, pink wound bed without slough. (Active)  06/22/19 2102  Location: Sacrum  Location Orientation: Left  Staging: Stage 2 -  Partial thickness loss of dermis presenting as a shallow open injury with a red, pink wound bed without slough.  Wound Description (Comments):   Present on Admission: Yes      Consultants:  GI General Surgery   Procedures:  See below  Antimicrobials:  Anti-infectives (From admission, onward)    Start     Dose/Rate Route Frequency Ordered Stop   10/17/20 2200  meropenem (MERREM) 1 g in sodium chloride 0.9 % 100 mL IVPB  1 g 200 mL/hr over 30 Minutes Intravenous Every 12 hours 10/17/20 1544     10/17/20 1315  meropenem (MERREM) 1 g in sodium chloride 0.9 % 100 mL IVPB        1 g 200 mL/hr over 30 Minutes Intravenous  Once 10/17/20 1307 10/17/20 1518       Subjective: Patient seen and evaluated today with ongoing complaints of low back pain.  She states that her pain control has not been optimized.  She is  otherwise able to tolerate clear liquid diet.  Objective: Vitals:   10/19/20 1707 10/19/20 2004 10/20/20 0559 10/20/20 0821  BP: 133/81 (!) 143/77 130/73 (!) 148/87  Pulse: 72 (!) 57 66 61  Resp: 20 17 16    Temp: (!) 97.5 F (36.4 C) 98.4 F (36.9 C) 99 F (37.2 C)   TempSrc: Oral Oral Oral   SpO2: 95% 96% 96%   Weight:      Height:        Intake/Output Summary (Last 24 hours) at 10/20/2020 1056 Last data filed at 10/20/2020 0224 Gross per 24 hour  Intake 1654.04 ml  Output 150 ml  Net 1504.04 ml   Filed Weights   10/17/20 0755 10/18/20 0318  Weight: 49.4 kg 58.3 kg    Examination:  General exam: Appears moderately anxious with discomfort noted Respiratory system: Clear to auscultation. Respiratory effort normal. Cardiovascular system: S1 & S2 heard, RRR.  Gastrointestinal system: Abdomen is soft Central nervous system: Alert and awake Extremities: No edema Skin: No significant lesions noted Psychiatry: Flat affect.    Data Reviewed: I have personally reviewed following labs and imaging studies  CBC: Recent Labs  Lab 10/17/20 0833 10/18/20 0528 10/19/20 0503 10/20/20 0525  WBC 16.3* 19.9* 12.7* 10.3  NEUTROABS 15.3*  --   --   --   HGB 14.2 12.8 13.4 13.6  HCT 45.2 40.2 40.4 41.0  MCV 100.4* 99.8 98.5 98.6  PLT 162 121* 124* 295*   Basic Metabolic Panel: Recent Labs  Lab 10/17/20 0833 10/18/20 0528 10/19/20 0503 10/20/20 0525  NA 135 133* 134* 133*  K 3.6 4.6 4.6 4.1  CL 101 102 105 104  CO2 28 25 24 22   GLUCOSE 87 72 93 80  BUN 14 17 22 19   CREATININE 0.73 0.74 0.72 0.66  CALCIUM 8.5* 8.1* 8.5* 8.4*  MG  --  1.8 1.9 1.6*   GFR: Estimated Creatinine Clearance: 48.4 mL/min (by C-G formula based on SCr of 0.66 mg/dL). Liver Function Tests: Recent Labs  Lab 10/17/20 0833 10/18/20 0528 10/19/20 0503 10/20/20 0525  AST 633* 189* 93* 44*  ALT 288* 204* 152* 109*  ALKPHOS 269* 217* 242* 284*  BILITOT 2.5* 2.6* 2.1* 3.2*  PROT 5.3* 4.9*  5.0* 5.1*  ALBUMIN 3.3* 2.8* 2.8* 2.9*   Recent Labs  Lab 10/17/20 0833 10/20/20 0525  LIPASE 27  --   AMYLASE  --  43   No results for input(s): AMMONIA in the last 168 hours. Coagulation Profile: Recent Labs  Lab 10/17/20 0833 10/18/20 0528 10/19/20 0503 10/20/20 0525  INR 3.3* 3.5* 1.3* 1.2   Cardiac Enzymes: No results for input(s): CKTOTAL, CKMB, CKMBINDEX, TROPONINI in the last 168 hours. BNP (last 3 results) No results for input(s): PROBNP in the last 8760 hours. HbA1C: No results for input(s): HGBA1C in the last 72 hours. CBG: No results for input(s): GLUCAP in the last 168 hours. Lipid Profile: No results for input(s): CHOL, HDL, LDLCALC, TRIG, CHOLHDL, LDLDIRECT in  the last 72 hours. Thyroid Function Tests: No results for input(s): TSH, T4TOTAL, FREET4, T3FREE, THYROIDAB in the last 72 hours. Anemia Panel: No results for input(s): VITAMINB12, FOLATE, FERRITIN, TIBC, IRON, RETICCTPCT in the last 72 hours. Sepsis Labs: Recent Labs  Lab 10/17/20 1317  LATICACIDVEN 1.8    Recent Results (from the past 240 hour(s))  Blood culture (routine x 2)     Status: Abnormal (Preliminary result)   Collection Time: 10/17/20  1:17 PM   Specimen: BLOOD RIGHT HAND  Result Value Ref Range Status   Specimen Description   Final    BLOOD RIGHT HAND Performed at [facility], [address] [place], [place] [address]    Special Requests   Final    Blood Culture results may not be optimal due to an excessive volume of blood received in culture bottles [place] Performed at [facility], [address], [place], [place] [address]    Culture  Setup Time   Final    GRAM NEGATIVE RODS IN BOTH AEROBIC AND ANAEROBIC BOTTLES Gram Stain Report Called to,Read Back By and Verified With: MORRIS,B AT 0945 ON 10.6.22 BY RUCINSKI,B Organism ID to follow CRITICAL RESULT CALLED TO, READ BACK BY AND VERIFIED WITHChucky May PHARMD 1427 10/19/20 A BROWNING Performed  at [facility], [address], [place], [address] [address]    Culture (A)  Final    ESCHERICHIA COLI SUSCEPTIBILITIES TO FOLLOW Performed at [facility], [place] [address] [place], [place] [address]    Report Status PENDING  Incomplete  Blood culture (routine x 2)     Status: None (Preliminary result)   Collection Time: 10/17/20  1:17 PM   Specimen: BLOOD LEFT FOREARM  Result Value Ref Range Status   Specimen Description BLOOD LEFT FOREARM  Final   Special Requests   Final    Blood Culture results may not be optimal due to an excessive volume of blood received in culture bottles BOTTLES DRAWN AEROBIC AND ANAEROBIC   Culture   Final    NO GROWTH 3 DAYS Performed at [facility], [address] [place], [place] [address]    Report Status PENDING  Incomplete  Blood Culture ID Panel (Reflexed)     Status: Abnormal   Collection Time: 10/17/20  1:17 PM  Result Value Ref Range Status   Enterococcus faecalis NOT DETECTED NOT DETECTED Final   Enterococcus Faecium NOT DETECTED NOT DETECTED Final   Listeria monocytogenes NOT DETECTED NOT DETECTED Final   Staphylococcus species NOT DETECTED NOT DETECTED Final   Staphylococcus aureus (BCID) NOT DETECTED NOT DETECTED Final   Staphylococcus epidermidis NOT DETECTED NOT DETECTED Final   Staphylococcus lugdunensis NOT DETECTED NOT DETECTED Final   Streptococcus species NOT DETECTED NOT DETECTED Final   Streptococcus agalactiae NOT DETECTED NOT DETECTED Final   Streptococcus pneumoniae NOT DETECTED NOT DETECTED Final   Streptococcus pyogenes NOT DETECTED NOT DETECTED Final   A.calcoaceticus-baumannii NOT DETECTED NOT DETECTED Final   Bacteroides fragilis NOT DETECTED NOT DETECTED Final   Enterobacterales DETECTED (A) NOT DETECTED Final    Comment: Enterobacterales represent a large order of gram negative bacteria, not a single organism. CRITICAL RESULT CALLED TO, READ BACK BY AND VERIFIED WITH: G COFFEE PHARMD 1427 10/19/20 A BROWNING     Enterobacter cloacae complex NOT DETECTED NOT DETECTED Final   Escherichia coli DETECTED (A) NOT DETECTED Final    Comment: CRITICAL RESULT CALLED TO, READ BACK BY AND VERIFIED WITH: G COFFEE  PHARMD 1427 10/19/20 A BROWNING    Klebsiella aerogenes NOT DETECTED NOT DETECTED Final   Klebsiella oxytoca NOT DETECTED NOT DETECTED Final   Klebsiella pneumoniae NOT DETECTED NOT DETECTED Final   Proteus species NOT DETECTED NOT DETECTED Final   Salmonella species NOT DETECTED NOT DETECTED Final   Serratia marcescens NOT DETECTED NOT DETECTED Final   Haemophilus influenzae NOT DETECTED NOT DETECTED Final   Neisseria meningitidis NOT DETECTED NOT DETECTED Final   Pseudomonas aeruginosa NOT DETECTED NOT DETECTED Final   Stenotrophomonas maltophilia NOT DETECTED NOT DETECTED Final   Candida albicans NOT DETECTED NOT DETECTED Final   Candida auris NOT DETECTED NOT DETECTED Final   Candida glabrata NOT DETECTED NOT DETECTED Final   Candida krusei NOT DETECTED NOT DETECTED Final   Candida parapsilosis NOT DETECTED NOT DETECTED Final   Candida tropicalis NOT DETECTED NOT DETECTED Final   Cryptococcus neoformans/gattii NOT DETECTED NOT DETECTED Final   CTX-M ESBL NOT DETECTED NOT DETECTED Final   Carbapenem resistance IMP NOT DETECTED NOT DETECTED Final   Carbapenem resistance KPC NOT DETECTED NOT DETECTED Final   Carbapenem resistance NDM NOT DETECTED NOT DETECTED Final   Carbapenem resist OXA 48 LIKE NOT DETECTED NOT DETECTED Final   Carbapenem resistance VIM NOT DETECTED NOT DETECTED Final    Comment: Performed at [facility], [place] [address], [place], [place] [address]  Resp Panel by RT-PCR (Flu A&B, Covid) Nasopharyngeal Swab     Status: None   Collection Time: 10/17/20  1:49 PM   Specimen: Nasopharyngeal Swab; Nasopharyngeal(NP) swabs in vial transport medium  Result Value Ref Range Status   SARS Coronavirus 2 by RT PCR NEGATIVE NEGATIVE Final    Comment: (NOTE) SARS-CoV-2 target nucleic  acids are NOT DETECTED.  The SARS-CoV-2 RNA is generally detectable in upper respiratory specimens during the acute phase of infection. The lowest concentration of SARS-CoV-2 viral copies this assay can detect is 138 copies/mL. A negative result does not preclude SARS-Cov-2 infection and should not be used as the sole basis for treatment or other patient management decisions. A negative result may occur with  improper specimen collection/handling, submission of specimen other than nasopharyngeal swab, presence of viral mutation(s) within the areas targeted by this assay, and inadequate number of viral copies(<138 copies/mL). A negative result must be combined with clinical observations, patient history, and epidemiological information. The expected result is Negative.  Fact Sheet for Patients:  EntrepreneurPulse.com.au  Fact Sheet for Healthcare Providers:  IncredibleEmployment.be  This test is no t yet approved or cleared by the [place] FDA and  has been authorized for detection and/or diagnosis of SARS-CoV-2 by FDA under an Emergency Use Authorization (EUA). This EUA will remain  in effect (meaning this test can be used) for the duration of the COVID-19 declaration under Section 564(b)(1) of the Act, 21 U.S.C.section 360bbb-3(b)(1), unless the authorization is terminated  or revoked sooner.       Influenza A by PCR NEGATIVE NEGATIVE Final   Influenza B by PCR NEGATIVE NEGATIVE Final    Comment: (NOTE) The Xpert Xpress SARS-CoV-2/FLU/RSV plus assay is intended as an aid in the diagnosis of influenza from Nasopharyngeal swab specimens and should not be used as a sole basis for treatment. Nasal washings and aspirates are unacceptable for Xpert Xpress SARS-CoV-2/FLU/RSV testing.  Fact Sheet for Patients: EntrepreneurPulse.com.au  Fact Sheet for Healthcare Providers: IncredibleEmployment.be  This  test is not yet approved or cleared by the [place] and has been authorized for  detection and/or diagnosis of SARS-CoV-2 by FDA under an Emergency Use Authorization (EUA). This EUA will remain in effect (meaning this test can be used) for the duration of the COVID-19 declaration under Section 564(b)(1) of the Act, 21 U.S.C. section 360bbb-3(b)(1), unless the authorization is terminated or revoked.  Performed at [facility], [address], [place], [place] [address]          Radiology Studies: DG ERCP  Result Date: 10/19/2020 CLINICAL DATA:  ERCP EXAM: ERCP TECHNIQUE: Multiple spot images obtained with the fluoroscopic device and submitted for interpretation post-procedure. FLUOROSCOPY TIME:  Fluoroscopy Time:  2 minutes 53 seconds Radiation Exposure Index (if provided by the fluoroscopic device): 70 mGy Number of Acquired Spot Images: 10 fluoroscopic loops COMPARISON:  None. FINDINGS: A total of 10 fluoroscopic loops are submitted for review. Images were taken during ERCP procedure. Images demonstrate a scope overlying the mid abdomen. The CBD appears to be catheterized. Subsequent loops demonstrate gradual opacification of the common bile duct. Eventually, there is reflux of contrast material via the cystic duct into the gallbladder. Final images demonstrate balloon sweep of the CBD. IMPRESSION: Fluoroscopic films submitted during ERCP as described. Refer to procedure report for full details. These images were submitted for radiologic interpretation only. Please see the procedural report for the amount of contrast and the fluoroscopy time utilized. Electronically Signed   By: Albin Felling M.D.   On: 10/19/2020 12:15        Scheduled Meds:  atenolol  12.5 mg Oral BID   cycloSPORINE  1 drop Both Eyes BID   levothyroxine  100 mcg Oral q morning   loratadine  10 mg Oral Daily   oxyCODONE-acetaminophen  1 tablet Oral Q6H   pantoprazole (PROTONIX) IV  40 mg Intravenous Q24H    predniSONE  5 mg Oral q morning   Continuous Infusions:  [START ON 10/21/2020] heparin     lactated ringers 75 mL/hr at 10/20/20 0523   meropenem (MERREM) IV 1 g (10/20/20 0942)     LOS: 3 days    Time spent: 35 minutes    Zona Pedro Darleen Crocker, DO [facility]  If 7PM-7AM, please contact night-coverage [email or web address] 10/20/2020, 10:56 AM

## 2020-10-21 DIAGNOSIS — K8001 Calculus of gallbladder with acute cholecystitis with obstruction: Secondary | ICD-10-CM | POA: Diagnosis not present

## 2020-10-21 DIAGNOSIS — K8050 Calculus of bile duct without cholangitis or cholecystitis without obstruction: Secondary | ICD-10-CM | POA: Diagnosis not present

## 2020-10-21 DIAGNOSIS — G8929 Other chronic pain: Secondary | ICD-10-CM

## 2020-10-21 DIAGNOSIS — R52 Pain, unspecified: Secondary | ICD-10-CM

## 2020-10-21 LAB — COMPREHENSIVE METABOLIC PANEL
ALT: 74 U/L — ABNORMAL HIGH (ref 0–44)
AST: 28 U/L (ref 15–41)
Albumin: 2.5 g/dL — ABNORMAL LOW (ref 3.5–5.0)
Alkaline Phosphatase: 224 U/L — ABNORMAL HIGH (ref 38–126)
Anion gap: 4 — ABNORMAL LOW (ref 5–15)
BUN: 13 mg/dL (ref 8–23)
CO2: 25 mmol/L (ref 22–32)
Calcium: 8.1 mg/dL — ABNORMAL LOW (ref 8.9–10.3)
Chloride: 104 mmol/L (ref 98–111)
Creatinine, Ser: 0.61 mg/dL (ref 0.44–1.00)
GFR, Estimated: >60 mL/min (ref >60)
Glucose, Bld: 86 mg/dL (ref 70–99)
Potassium: 4 mmol/L (ref 3.5–5.1)
Sodium: 133 mmol/L — ABNORMAL LOW (ref 135–145)
Total Bilirubin: 2.1 mg/dL — ABNORMAL HIGH (ref 0.3–1.2)
Total Protein: 4.4 g/dL — ABNORMAL LOW (ref 6.5–8.1)

## 2020-10-21 LAB — CBC
HCT: 37.9 % (ref 36.0–46.0)
Hemoglobin: 12.2 g/dL (ref 12.0–15.0)
MCH: 31.6 pg (ref 26.0–34.0)
MCHC: 32.2 g/dL (ref 30.0–36.0)
MCV: 98.2 fL (ref 80.0–100.0)
Platelets: 109 10*3/uL — ABNORMAL LOW (ref 150–400)
RBC: 3.86 MIL/uL — ABNORMAL LOW (ref 3.87–5.11)
RDW: 15 % (ref 11.5–15.5)
WBC: 6.6 10*3/uL (ref 4.0–10.5)
nRBC: 0 % (ref 0.0–0.2)

## 2020-10-21 LAB — CULTURE, BLOOD (ROUTINE X 2)

## 2020-10-21 LAB — MAGNESIUM: Magnesium: 1.9 mg/dL (ref 1.7–2.4)

## 2020-10-21 LAB — SURGICAL PCR SCREEN
MRSA, PCR: NEGATIVE
Staphylococcus aureus: NEGATIVE

## 2020-10-21 LAB — HEPARIN LEVEL (UNFRACTIONATED): Heparin Unfractionated: <0.1 IU/mL — ABNORMAL LOW (ref 0.30–0.70)

## 2020-10-21 MED ORDER — METHOCARBAMOL 500 MG PO TABS
750.0000 mg | ORAL_TABLET | Freq: Four times a day (QID) | ORAL | Status: DC
  Administered 2020-10-21 – 2020-10-28 (×28): 750 mg via ORAL
  Filled 2020-10-21 (×28): qty 2

## 2020-10-21 NOTE — Progress Notes (Signed)
PROGRESS NOTE    Angela Wong  MWU:[id number] DOB: 25-Jul-1940 DOA: 10/17/2020 PCP: [name], FNP   Brief Narrative:   Angela Wong is a 80 y.o. female with medical history significant for atrial fibrillation on warfarin, sick sinus syndrome with pacemaker, hypertension, congestive cardiomyopathy, prior NSTEMI with thrombus in distal PLA secondary to subtherapeutic INR, Sjogren's syndrome on chronic prednisone, hypothyroidism, and COPD who presented to the ED with worsening nausea, vomiting, diarrhea that appears to have began over the last few days.  Patient was admitted with acute cholangitis with choledocholithiasis and has undergone ERCP with stone extraction on 10/6 and is now awaiting laparoscopic cholecystectomy on 10/10.  Assessment & Plan:   Active Problems:   Choledocholithiasis   Calculus of gallbladder with acute cholecystitis and obstruction   Pain   Acute choledocholithiasis with cholangitis -Status post ERCP with stone extraction on 10/6 -General surgery planning for laparoscopic cholecystectomy 10/10 -Currently on heart healthy diet -Symptomatic management -Trend LFTs with downward trend noted -Continue on Merrem for empiric coverage -Hold IVF   Transaminitis secondary to above -Now noted to be decreasing -Continue to trend -Plan for ERCP 10/6   Spinal stenosis -Ongoing low back pain with oxycodone L scheduled -Fentanyl for breakthrough pain -Muscle relaxers as needed   Recent dysuria -Continue on Merrem as noted above -UA with no signs of UTI   History of atrial fibrillation -INR currently subtherapeutic at 1.3 to allow for ERCP -Warfarin currently discontinued and heparin drip will be used to bridge once INR less than 2 -Monitor on telemetry -Continue atenolol -Currently rate controlled   History of COPD -No acute bronchospasms -Breathing treatments as needed   History of hypothyroidism -Continue levothyroxine   History of  Sjogren's syndrome -Continue on prednisone and Restasis     DVT prophylaxis: Heparin drip Code Status: Full Family Communication: Discussed with son Corene Cornea on phone 10/7 Disposition Plan:  Status is: Inpatient   Remains inpatient appropriate because:Ongoing diagnostic testing needed not appropriate for outpatient work up, IV treatments appropriate due to intensity of illness or inability to take PO, and Inpatient level of care appropriate due to severity of illness   Dispo: The patient is from: Home              Anticipated d/c is to: Home              Patient currently is not medically stable to d/c.              Difficult to place patient No     Skin Assessment:   I have examined the patient's skin and I agree with the wound assessment as performed by the wound care RN as outlined below:   Pressure Injury 06/22/19 Sacrum Left Stage 2 -  Partial thickness loss of dermis presenting as a shallow open injury with a red, pink wound bed without slough. (Active)  06/22/19 2102  Location: Sacrum  Location Orientation: Left  Staging: Stage 2 -  Partial thickness loss of dermis presenting as a shallow open injury with a red, pink wound bed without slough.  Wound Description (Comments):   Present on Admission: Yes      Consultants:  GI General Surgery   Procedures:  See below  Antimicrobials:  Anti-infectives (From admission, onward)    Start     Dose/Rate Route Frequency Ordered Stop   10/17/20 2200  meropenem (MERREM) 1 g in sodium chloride 0.9 % 100 mL IVPB  1 g 200 mL/hr over 30 Minutes Intravenous Every 12 hours 10/17/20 1544     10/17/20 1315  meropenem (MERREM) 1 g in sodium chloride 0.9 % 100 mL IVPB        1 g 200 mL/hr over 30 Minutes Intravenous  Once 10/17/20 1307 10/17/20 1518       Subjective: Patient seen and evaluated today with no new acute complaints or concerns. No acute concerns or events noted overnight.  She denies any abdominal pain and states  that her low back pain has improved.  Objective: Vitals:   10/20/20 1305 10/20/20 2205 10/21/20 0617 10/21/20 1006  BP: 132/81 122/77 116/76 131/80  Pulse: (!) 57 (!) 56 64 61  Resp: 18 20 18    Temp: 97.8 F (36.6 C) (!) 97.5 F (36.4 C) 98.3 F (36.8 C)   TempSrc: Oral Oral Oral   SpO2: 94% 98% 96%   Weight:      Height:        Intake/Output Summary (Last 24 hours) at 10/21/2020 1030 Last data filed at 10/21/2020 0853 Gross per 24 hour  Intake 4060.7 ml  Output --  Net 4060.7 ml   Filed Weights   10/17/20 0755 10/18/20 0318  Weight: 49.4 kg 58.3 kg    Examination:  General exam: Appears calm and comfortable  Respiratory system: Clear to auscultation. Respiratory effort normal. Cardiovascular system: S1 & S2 heard, RRR.  Gastrointestinal system: Abdomen is soft Central nervous system: Alert and awake Extremities: No edema Skin: No significant lesions noted Psychiatry: Flat affect.    Data Reviewed: I have personally reviewed following labs and imaging studies  CBC: Recent Labs  Lab 10/17/20 0833 10/18/20 0528 10/19/20 0503 10/20/20 0525 10/21/20 0242  WBC 16.3* 19.9* 12.7* 10.3 6.6  NEUTROABS 15.3*  --   --   --   --   HGB 14.2 12.8 13.4 13.6 12.2  HCT 45.2 40.2 40.4 41.0 37.9  MCV 100.4* 99.8 98.5 98.6 98.2  PLT 162 121* 124* 112* 989*   Basic Metabolic Panel: Recent Labs  Lab 10/17/20 0833 10/18/20 0528 10/19/20 0503 10/20/20 0525 10/21/20 0242  NA 135 133* 134* 133* 133*  K 3.6 4.6 4.6 4.1 4.0  CL 101 102 105 104 104  CO2 28 25 24 22 25   GLUCOSE 87 72 93 80 86  BUN 14 17 22 19 13   CREATININE 0.73 0.74 0.72 0.66 0.61  CALCIUM 8.5* 8.1* 8.5* 8.4* 8.1*  MG  --  1.8 1.9 1.6* 1.9   GFR: Estimated Creatinine Clearance: 48.4 mL/min (by C-G formula based on SCr of 0.61 mg/dL). Liver Function Tests: Recent Labs  Lab 10/17/20 2119 10/18/20 0528 10/19/20 0503 10/20/20 0525 10/21/20 0242  AST 633* 189* 93* 44* 28  ALT 288* 204* 152* 109*  74*  ALKPHOS 269* 217* 242* 284* 224*  BILITOT 2.5* 2.6* 2.1* 3.2* 2.1*  PROT 5.3* 4.9* 5.0* 5.1* 4.4*  ALBUMIN 3.3* 2.8* 2.8* 2.9* 2.5*   Recent Labs  Lab 10/17/20 0833 10/20/20 0525  LIPASE 27  --   AMYLASE  --  43   No results for input(s): AMMONIA in the last 168 hours. Coagulation Profile: Recent Labs  Lab 10/17/20 0833 10/18/20 0528 10/19/20 0503 10/20/20 0525  INR 3.3* 3.5* 1.3* 1.2   Cardiac Enzymes: No results for input(s): CKTOTAL, CKMB, CKMBINDEX, TROPONINI in the last 168 hours. BNP (last 3 results) No results for input(s): PROBNP in the last 8760 hours. HbA1C: No results for input(s): HGBA1C in the  last 72 hours. CBG: No results for input(s): GLUCAP in the last 168 hours. Lipid Profile: No results for input(s): CHOL, HDL, LDLCALC, TRIG, CHOLHDL, LDLDIRECT in the last 72 hours. Thyroid Function Tests: No results for input(s): TSH, T4TOTAL, FREET4, T3FREE, THYROIDAB in the last 72 hours. Anemia Panel: No results for input(s): VITAMINB12, FOLATE, FERRITIN, TIBC, IRON, RETICCTPCT in the last 72 hours. Sepsis Labs: Recent Labs  Lab 10/17/20 1317  LATICACIDVEN 1.8    Recent Results (from the past 240 hour(s))  Blood culture (routine x 2)     Status: Abnormal   Collection Time: 10/17/20  1:17 PM   Specimen: BLOOD RIGHT HAND  Result Value Ref Range Status   Specimen Description   Final    BLOOD RIGHT HAND Performed at [facility], [address], [place], [place] [address]    Special Requests   Final    Blood Culture results may not be optimal due to an excessive volume of blood received in culture bottles BOTTLES DRAWN AEROBIC AND ANAEROBIC Performed at [facility], [address] [place], [place] [address]    Culture  Setup Time   Final    GRAM NEGATIVE RODS IN BOTH AEROBIC AND ANAEROBIC BOTTLES Gram Stain Report Called to,Read Back By and Verified With: MORRIS,B AT 0945 ON 10.6.22 BY RUCINSKI,B Organism ID to follow CRITICAL RESULT CALLED TO,  READ BACK BY AND VERIFIED WITHDarnell Level COFFEE PHARMD 1427 10/19/20 A BROWNING Performed at [facility], [address] [place],  [address]    Culture ESCHERICHIA COLI (A)  Final   Report Status 10/21/2020 FINAL  Final   Organism ID, Bacteria ESCHERICHIA COLI  Final      Susceptibility   Escherichia coli - MIC*    AMPICILLIN <=2 SENSITIVE Sensitive     CEFAZOLIN <=4 SENSITIVE Sensitive     CEFEPIME <=0.12 SENSITIVE Sensitive     CEFTAZIDIME <=1 SENSITIVE Sensitive     CEFTRIAXONE <=0.25 SENSITIVE Sensitive     CIPROFLOXACIN <=0.25 SENSITIVE Sensitive     GENTAMICIN <=1 SENSITIVE Sensitive     IMIPENEM <=0.25 SENSITIVE Sensitive     TRIMETH/SULFA <=20 SENSITIVE Sensitive     AMPICILLIN/SULBACTAM <=2 SENSITIVE Sensitive     PIP/TAZO <=4 SENSITIVE Sensitive     * ESCHERICHIA COLI  Blood culture (routine x 2)     Status: None (Preliminary result)   Collection Time: 10/17/20  1:17 PM   Specimen: BLOOD LEFT FOREARM  Result Value Ref Range Status   Specimen Description BLOOD LEFT FOREARM  Final   Special Requests   Final    Blood Culture results may not be optimal due to an excessive volume of blood received in culture bottles BOTTLES DRAWN AEROBIC AND ANAEROBIC   Culture   Final    NO GROWTH 4 DAYS Performed at [facility], [address], [place],  [address]    Report Status PENDING  Incomplete  Blood Culture ID Panel (Reflexed)     Status: Abnormal   Collection Time: 10/17/20  1:17 PM  Result Value Ref Range Status   Enterococcus faecalis NOT DETECTED NOT DETECTED Final   Enterococcus Faecium NOT DETECTED NOT DETECTED Final   Listeria monocytogenes NOT DETECTED NOT DETECTED Final   Staphylococcus species NOT DETECTED NOT DETECTED Final   Staphylococcus aureus (BCID) NOT DETECTED NOT DETECTED Final   Staphylococcus epidermidis NOT DETECTED NOT DETECTED Final   Staphylococcus lugdunensis NOT DETECTED NOT DETECTED Final   Streptococcus species NOT DETECTED NOT DETECTED  Final  Streptococcus agalactiae NOT DETECTED NOT DETECTED Final   Streptococcus pneumoniae NOT DETECTED NOT DETECTED Final   Streptococcus pyogenes NOT DETECTED NOT DETECTED Final   A.calcoaceticus-baumannii NOT DETECTED NOT DETECTED Final   Bacteroides fragilis NOT DETECTED NOT DETECTED Final   Enterobacterales DETECTED (A) NOT DETECTED Final    Comment: Enterobacterales represent a large order of gram negative bacteria, not a single organism. CRITICAL RESULT CALLED TO, READ BACK BY AND VERIFIED WITH: G COFFEE PHARMD 1427 10/19/20 A BROWNING    Enterobacter cloacae complex NOT DETECTED NOT DETECTED Final   Escherichia coli DETECTED (A) NOT DETECTED Final    Comment: CRITICAL RESULT CALLED TO, READ BACK BY AND VERIFIED WITH: G COFFEE PHARMD 1427 10/19/20 A BROWNING    Klebsiella aerogenes NOT DETECTED NOT DETECTED Final   Klebsiella oxytoca NOT DETECTED NOT DETECTED Final   Klebsiella pneumoniae NOT DETECTED NOT DETECTED Final   Proteus species NOT DETECTED NOT DETECTED Final   Salmonella species NOT DETECTED NOT DETECTED Final   Serratia marcescens NOT DETECTED NOT DETECTED Final   Haemophilus influenzae NOT DETECTED NOT DETECTED Final   Neisseria meningitidis NOT DETECTED NOT DETECTED Final   Pseudomonas aeruginosa NOT DETECTED NOT DETECTED Final   Stenotrophomonas maltophilia NOT DETECTED NOT DETECTED Final   Candida albicans NOT DETECTED NOT DETECTED Final   Candida auris NOT DETECTED NOT DETECTED Final   Candida glabrata NOT DETECTED NOT DETECTED Final   Candida krusei NOT DETECTED NOT DETECTED Final   Candida parapsilosis NOT DETECTED NOT DETECTED Final   Candida tropicalis NOT DETECTED NOT DETECTED Final   Cryptococcus neoformans/gattii NOT DETECTED NOT DETECTED Final   CTX-M ESBL NOT DETECTED NOT DETECTED Final   Carbapenem resistance IMP NOT DETECTED NOT DETECTED Final   Carbapenem resistance KPC NOT DETECTED NOT DETECTED Final   Carbapenem resistance NDM NOT DETECTED NOT  DETECTED Final   Carbapenem resist OXA 48 LIKE NOT DETECTED NOT DETECTED Final   Carbapenem resistance VIM NOT DETECTED NOT DETECTED Final    Comment: Performed at [facility], [address] [place], [place] [address]  Resp Panel by RT-PCR (Flu A&B, Covid) Nasopharyngeal Swab     Status: None   Collection Time: 10/17/20  1:49 PM   Specimen: Nasopharyngeal Swab; Nasopharyngeal(NP) swabs in vial transport medium  Result Value Ref Range Status   SARS Coronavirus 2 by RT PCR NEGATIVE NEGATIVE Final    Comment: (NOTE) SARS-CoV-2 target nucleic acids are NOT DETECTED.  The SARS-CoV-2 RNA is generally detectable in upper respiratory specimens during the acute phase of infection. The lowest concentration of SARS-CoV-2 viral copies this assay can detect is 138 copies/mL. A negative result does not preclude SARS-Cov-2 infection and should not be used as the sole basis for treatment or other patient management decisions. A negative result may occur with  improper specimen collection/handling, submission of specimen other than nasopharyngeal swab, presence of viral mutation(s) within the areas targeted by this assay, and inadequate number of viral copies(<138 copies/mL). A negative result must be combined with clinical observations, patient history, and epidemiological information. The expected result is Negative.  Fact Sheet for Patients:  EntrepreneurPulse.com.au  Fact Sheet for Healthcare Providers:  IncredibleEmployment.be  This test is no t yet approved or cleared by the [place] FDA and  has been authorized for detection and/or diagnosis of SARS-CoV-2 by FDA under an Emergency Use Authorization (EUA). This EUA will remain  in effect (meaning this test can be used) for the duration of the COVID-19 declaration  under Section 564(b)(1) of the Act, 21 U.S.C.section 360bbb-3(b)(1), unless the authorization is terminated  or revoked sooner.        Influenza A by PCR NEGATIVE NEGATIVE Final   Influenza B by PCR NEGATIVE NEGATIVE Final    Comment: (NOTE) The Xpert Xpress SARS-CoV-2/FLU/RSV plus assay is intended as an aid in the diagnosis of influenza from Nasopharyngeal swab specimens and should not be used as a sole basis for treatment. Nasal washings and aspirates are unacceptable for Xpert Xpress SARS-CoV-2/FLU/RSV testing.  Fact Sheet for Patients: EntrepreneurPulse.com.au  Fact Sheet for Healthcare Providers: IncredibleEmployment.be  This test is not yet approved or cleared by the [place] FDA and has been authorized for detection and/or diagnosis of SARS-CoV-2 by FDA under an Emergency Use Authorization (EUA). This EUA will remain in effect (meaning this test can be used) for the duration of the COVID-19 declaration under Section 564(b)(1) of the Act, 21 U.S.C. section 360bbb-3(b)(1), unless the authorization is terminated or revoked.  Performed at [facility], [address], [place], [place] [address]   Surgical pcr screen     Status: None   Collection Time: 10/21/20 12:06 AM   Specimen: Nasal Mucosa; Nasal Swab  Result Value Ref Range Status   MRSA, PCR NEGATIVE NEGATIVE Final   Staphylococcus aureus NEGATIVE NEGATIVE Final    Comment: (NOTE) The Xpert SA Assay (FDA approved for NASAL specimens in patients 55 years of age and older), is one component of a comprehensive surveillance program. It is not intended to diagnose infection nor to guide or monitor treatment. Performed at [facility], [address] [place], [place] [address]          Radiology Studies: DG ERCP  Result Date: 10/19/2020 CLINICAL DATA:  ERCP EXAM: ERCP TECHNIQUE: Multiple spot images obtained with the fluoroscopic device and submitted for interpretation post-procedure. FLUOROSCOPY TIME:  Fluoroscopy Time:  2 minutes 53 seconds Radiation Exposure Index (if provided by the fluoroscopic  device): 70 mGy Number of Acquired Spot Images: 10 fluoroscopic loops COMPARISON:  None. FINDINGS: A total of 10 fluoroscopic loops are submitted for review. Images were taken during ERCP procedure. Images demonstrate a scope overlying the mid abdomen. The CBD appears to be catheterized. Subsequent loops demonstrate gradual opacification of the common bile duct. Eventually, there is reflux of contrast material via the cystic duct into the gallbladder. Final images demonstrate balloon sweep of the CBD. IMPRESSION: Fluoroscopic films submitted during ERCP as described. Refer to procedure report for full details. These images were submitted for radiologic interpretation only. Please see the procedural report for the amount of contrast and the fluoroscopy time utilized. Electronically Signed   By: Albin Felling M.D.   On: 10/19/2020 12:15        Scheduled Meds:  atenolol  12.5 mg Oral BID   cycloSPORINE  1 drop Both Eyes BID   levothyroxine  100 mcg Oral q morning   loratadine  10 mg Oral Daily   methocarbamol  750 mg Oral QID   oxyCODONE-acetaminophen  1 tablet Oral Q6H   pantoprazole (PROTONIX) IV  40 mg Intravenous Q24H   predniSONE  5 mg Oral q morning   Continuous Infusions:  heparin 900 Units/hr (10/21/20 0834)   lactated ringers 75 mL/hr at 10/20/20 2221   meropenem (MERREM) IV 1 g (10/21/20 1013)     LOS: 4 days    Time spent: 35 minutes    Raven Furnas Darleen Crocker, DO [facility]  If 7PM-7AM, please contact night-coverage [email or web address]  10/21/2020, 10:30 AM

## 2020-10-21 NOTE — Progress Notes (Signed)
2 Days Post-Op  Subjective: Patient denies any abdominal pain.  She has been on a clear liquid diet.  Objective: Vital Wong in last 24 hours: Temp:  [97.5 F (36.4 C)-98.3 F (36.8 C)] 98.3 F (36.8 C) (10/08 0617) Pulse Rate:  [56-64] 64 (10/08 0617) Resp:  [18-20] 18 (10/08 0617) BP: (116-132)/(76-81) 116/76 (10/08 0617) SpO2:  [94 %-98 %] 96 % (10/08 0617) Last BM Date: 10/19/20  Intake/Output from previous day: 10/07 0701 - 10/08 0700 In: 4300.7 [P.O.:2760; I.V.:1340.7; IV Piggyback:200] Out: -  Intake/Output this shift: Total I/O In: 360 [P.O.:360] Out: -   General appearance: alert, cooperative, and no distress GI: soft, non-tender; bowel sounds normal; no masses,  no organomegaly  Lab Results:  Recent Labs    10/20/20 0525 10/21/20 0242  WBC 10.3 6.6  HGB 13.6 12.2  HCT 41.0 37.9  PLT 112* 109*   BMET Recent Labs    10/20/20 0525 10/21/20 0242  NA 133* 133*  K 4.1 4.0  CL 104 104  CO2 22 25  GLUCOSE 80 86  BUN 19 13  CREATININE 0.66 0.61  CALCIUM 8.4* 8.1*   PT/INR Recent Labs    10/19/20 0503 10/20/20 0525  LABPROT 16.0* 14.7  INR 1.3* 1.2    Studies/Results: DG ERCP  Result Date: 10/19/2020 CLINICAL DATA:  ERCP EXAM: ERCP TECHNIQUE: Multiple spot images obtained with the fluoroscopic device and submitted for interpretation post-procedure. FLUOROSCOPY TIME:  Fluoroscopy Time:  2 minutes 53 seconds Radiation Exposure Index (if provided by the fluoroscopic device): 70 mGy Number of Acquired Spot Images: 10 fluoroscopic loops COMPARISON:  None. FINDINGS: A total of 10 fluoroscopic loops are submitted for review. Images were taken during ERCP procedure. Images demonstrate a scope overlying the mid abdomen. The CBD appears to be catheterized. Subsequent loops demonstrate gradual opacification of the common bile duct. Eventually, there is reflux of contrast material via the cystic duct into the gallbladder. Final images demonstrate balloon sweep of  the CBD. IMPRESSION: Fluoroscopic films submitted during ERCP as described. Refer to procedure report for full details. These images were submitted for radiologic interpretation only. Please see the procedural report for the amount of contrast and the fluoroscopy time utilized. Electronically Signed   By: Albin Felling M.D.   On: 10/19/2020 12:15    Anti-infectives: Anti-infectives (From admission, onward)    Start     Dose/Rate Route Frequency Ordered Stop   10/17/20 2200  meropenem (MERREM) 1 g in sodium chloride 0.9 % 100 mL IVPB        1 g 200 mL/hr over 30 Minutes Intravenous Every 12 hours 10/17/20 1544     10/17/20 1315  meropenem (MERREM) 1 g in sodium chloride 0.9 % 100 mL IVPB        1 g 200 mL/hr over 30 Minutes Intravenous  Once 10/17/20 1307 10/17/20 1518       Assessment/Plan: s/p Procedure(s): ENDOSCOPIC RETROGRADE CHOLANGIOPANCREATOGRAPHY (ERCP) WITH PROPOFOL ESOPHAGOGASTRODUODENOSCOPY (EGD) WITH PROPOFOL ESOPHAGEAL DILATION SPHINCTEROTOMY STONE EXTRACTION WITH BASKET Impression: Status post ERCP for choledocholithiasis.  Does have cholelithiasis.  Liver enzyme tests are normalizing. Plan: Advance diet.  Anticipate laparoscopic cholecystectomy on 10/23/2020.  LOS: 4 days    Angela Wong 10/21/2020

## 2020-10-21 NOTE — Progress Notes (Signed)
Spoke with pharamcy.  Pharmacy stated it was okay to restart pharmacy although patient for tentative procedure on Monday.

## 2020-10-21 NOTE — Progress Notes (Signed)
ANTICOAGULATION CONSULT NOTE -   Pharmacy Consult for Heparin Indication: atrial fibrillation  Allergies  Allergen Reactions   Cephalosporins Hives and Shortness Of Breath   Ciprofloxacin Hives and Shortness Of Breath   Diltiazem Shortness Of Breath    Swollen throat    Doxycycline Hives and Shortness Of Breath   Horse-Derived Products Anaphylaxis   Ketek [Telithromycin] Palpitations    Chest discomfort   Nitrofuran Derivatives Anaphylaxis and Hives    blisters   Nitrous Oxide Nausea And Vomiting    Severe due to Sjogrens (Auto-Immune Disease)   Other Anaphylaxis    ALLERGY TO HORSE SERUM   Penicillins Hives and Shortness Of Breath        Pentazocine Other (See Comments)    Other reaction(s): Mental Status Changes (intolerance) Altered Mental Status  Altered Mental Status  Altered Mental Status    Sulfa Antibiotics Hives and Shortness Of Breath   Trovan [Alatrofloxacin] Palpitations, Other (See Comments) and Anaphylaxis    Chest pain, dizziness, irregular pulse   Vitamin B12     Confusion and shaking    Zinc Gelatin [Zinc] Anaphylaxis   Amlodipine Swelling   Calcium Channel Blockers     Respiratory distress   Carvedilol Other (See Comments)    Dizziness, "joint pain, depression"   Clindamycin/Lincomycin     CP, lock jaw   Codeine Nausea Only   Cymbalta [Duloxetine Hcl] Swelling   Diovan [Valsartan] Swelling   Lisinopril Swelling   Sertraline Other (See Comments)    confusion   Tramadol Nausea Only   Loteprednol Etabonate Rash    Patient Measurements: Height: 5\' 4"  (162.6 cm) Weight: 58.3 kg (128 lb 8.5 oz) IBW/kg (Calculated) : 54.7 HEPARIN DW (KG): 49.4   Vital Signs: Temp: 98.4 F (36.9 C) (10/08 1430) Temp Source: Oral (10/08 0617) BP: 111/75 (10/08 1430) Pulse Rate: 59 (10/08 1430)  Labs: Recent Labs    10/19/20 0503 10/20/20 0525 10/21/20 0242 10/21/20 1511  HGB 13.4 13.6 12.2  --   HCT 40.4 41.0 37.9  --   PLT 124* 112* 109*  --    LABPROT 16.0* 14.7  --   --   INR 1.3* 1.2  --   --   HEPARINUNFRC  --   --   --  <0.10*  CREATININE 0.72 0.66 0.61  --      Estimated Creatinine Clearance: 48.4 mL/min (by C-G formula based on SCr of 0.61 mg/dL).   Medical History: Past Medical History:  Diagnosis Date   A-fib ([place])    Asthma    Cerebral vasculitis    Congestive dilated cardiomyopathy (H[facility])    COPD (chronic obstructive pulmonary disease) (HCC)    Fibromyalgia    Gastric polyp    GERD (gastroesophageal reflux disease)    Hiatal hernia    Hyperparathyroidism (HCC)    IBS (irritable bowel syndrome)    MVP (mitral valve prolapse)    Osteoarthritis    Ovarian cancer (HCC)    lymph node removal with hysterectomy   Raynaud's disease    RLS (restless legs syndrome)    Situational depression    Sjogren's syndrome (HCC)    Vasculitis (HCC)    Vitamin D deficiency     Medications:  See med rec  Assessment: 80 y.o. female with medical history significant for atrial fibrillation on warfarin, sick sinus syndrome with pacemaker, hypertension, congestive cardiomyopathy, prior NSTEMI with thrombus in distal PLA secondary to subtherapeutic INR. Current INR is 3.3, supratherapeutic. CT of abdomen suspicious for  choledocholithiasis and cholangitis.   MD says to restart heparin 10/8 @ 0700. >> Heparin level subtherapeutic, < 0.10.  Will increase Heparin to 1100 units/hr, PLTs low, continue to monitor.  Goal of Therapy:  Heparin level 0.3-0.7 units/ml Monitor platelets by anticoagulation protocol: Yes   Plan:  Increase Heparin to 1100 units/hr. Check anti-Xa level in ~8 hours and daily while on heparin Continue to monitor H&H and platelets  Hart Robinsons, PharmD Clinical Pharmacist 10/21/2020 4:28 PM

## 2020-10-22 DIAGNOSIS — K8001 Calculus of gallbladder with acute cholecystitis with obstruction: Secondary | ICD-10-CM | POA: Diagnosis not present

## 2020-10-22 DIAGNOSIS — K8050 Calculus of bile duct without cholangitis or cholecystitis without obstruction: Secondary | ICD-10-CM | POA: Diagnosis not present

## 2020-10-22 LAB — CBC
HCT: 40.7 % (ref 36.0–46.0)
Hemoglobin: 13.2 g/dL (ref 12.0–15.0)
MCH: 32.1 pg (ref 26.0–34.0)
MCHC: 32.4 g/dL (ref 30.0–36.0)
MCV: 99 fL (ref 80.0–100.0)
Platelets: 129 10*3/uL — ABNORMAL LOW (ref 150–400)
RBC: 4.11 MIL/uL (ref 3.87–5.11)
RDW: 14.8 % (ref 11.5–15.5)
WBC: 7.1 10*3/uL (ref 4.0–10.5)
nRBC: 0 % (ref 0.0–0.2)

## 2020-10-22 LAB — COMPREHENSIVE METABOLIC PANEL
ALT: 59 U/L — ABNORMAL HIGH (ref 0–44)
AST: 18 U/L (ref 15–41)
Albumin: 2.8 g/dL — ABNORMAL LOW (ref 3.5–5.0)
Alkaline Phosphatase: 213 U/L — ABNORMAL HIGH (ref 38–126)
Anion gap: 4 — ABNORMAL LOW (ref 5–15)
BUN: 14 mg/dL (ref 8–23)
CO2: 25 mmol/L (ref 22–32)
Calcium: 8.4 mg/dL — ABNORMAL LOW (ref 8.9–10.3)
Chloride: 107 mmol/L (ref 98–111)
Creatinine, Ser: 0.7 mg/dL (ref 0.44–1.00)
GFR, Estimated: >60 mL/min (ref >60)
Glucose, Bld: 81 mg/dL (ref 70–99)
Potassium: 4 mmol/L (ref 3.5–5.1)
Sodium: 136 mmol/L (ref 135–145)
Total Bilirubin: 1.5 mg/dL — ABNORMAL HIGH (ref 0.3–1.2)
Total Protein: 5.1 g/dL — ABNORMAL LOW (ref 6.5–8.1)

## 2020-10-22 LAB — MAGNESIUM: Magnesium: 1.7 mg/dL (ref 1.7–2.4)

## 2020-10-22 LAB — HEPARIN LEVEL (UNFRACTIONATED)
Heparin Unfractionated: 0.28 IU/mL — ABNORMAL LOW (ref 0.30–0.70)
Heparin Unfractionated: 0.41 IU/mL (ref 0.30–0.70)

## 2020-10-22 MED ORDER — CHLORHEXIDINE GLUCONATE CLOTH 2 % EX PADS
6.0000 | MEDICATED_PAD | Freq: Once | CUTANEOUS | Status: AC
  Administered 2020-10-23: 6 via TOPICAL

## 2020-10-22 MED ORDER — ALBUTEROL SULFATE HFA 108 (90 BASE) MCG/ACT IN AERS: 1.0000 | INHALATION_SPRAY | RESPIRATORY_TRACT | Status: DC | PRN

## 2020-10-22 NOTE — Progress Notes (Signed)
[facility] [employer] about patient desire for last rights before surgery.  Notifed [facility], left messsage to return call and no patient information/room number, etc, given in message.

## 2020-10-22 NOTE — Progress Notes (Signed)
ANTICOAGULATION CONSULT NOTE -   Pharmacy Consult for Heparin Indication: atrial fibrillation  Allergies  Allergen Reactions   Cephalosporins Hives and Shortness Of Breath   Ciprofloxacin Hives and Shortness Of Breath   Diltiazem Shortness Of Breath    Swollen throat    Doxycycline Hives and Shortness Of Breath   Horse-Derived Products Anaphylaxis   Ketek [Telithromycin] Palpitations    Chest discomfort   Nitrofuran Derivatives Anaphylaxis and Hives    blisters   Nitrous Oxide Nausea And Vomiting    Severe due to Sjogrens (Auto-Immune Disease)   Other Anaphylaxis    ALLERGY TO HORSE SERUM   Penicillins Hives and Shortness Of Breath        Pentazocine Other (See Comments)    Other reaction(s): Mental Status Changes (intolerance) Altered Mental Status  Altered Mental Status  Altered Mental Status    Sulfa Antibiotics Hives and Shortness Of Breath   Trovan [Alatrofloxacin] Palpitations, Other (See Comments) and Anaphylaxis    Chest pain, dizziness, irregular pulse   Vitamin B12     Confusion and shaking    Zinc Gelatin [Zinc] Anaphylaxis   Amlodipine Swelling   Calcium Channel Blockers     Respiratory distress   Carvedilol Other (See Comments)    Dizziness, "joint pain, depression"   Clindamycin/Lincomycin     CP, lock jaw   Codeine Nausea Only   Cymbalta [Duloxetine Hcl] Swelling   Diovan [Valsartan] Swelling   Lisinopril Swelling   Sertraline Other (See Comments)    confusion   Tramadol Nausea Only   Loteprednol Etabonate Rash    Patient Measurements: Height: 5\' 4"  (162.6 cm) Weight: 58.3 kg (128 lb 8.5 oz) IBW/kg (Calculated) : 54.7 HEPARIN DW (KG): 49.4   Vital Signs: Temp: 97.6 F (36.4 C) (10/08 2038) BP: 122/79 (10/08 2038) Pulse Rate: 59 (10/08 2038)  Labs: Recent Labs    10/19/20 0503 10/20/20 0525 10/21/20 0242 10/21/20 1511 10/22/20 0054  HGB 13.4 13.6 12.2  --   --   HCT 40.4 41.0 37.9  --   --   PLT 124* 112* 109*  --   --    LABPROT 16.0* 14.7  --   --   --   INR 1.3* 1.2  --   --   --   HEPARINUNFRC  --   --   --  <0.10* 0.28*  CREATININE 0.72 0.66 0.61  --   --      Estimated Creatinine Clearance: 48.4 mL/min (by C-G formula based on SCr of 0.61 mg/dL).   Medical History: Past Medical History:  Diagnosis Date   A-fib ([place])    Asthma    Cerebral vasculitis    Congestive dilated cardiomyopathy (HCC)    COPD (chronic obstructive pulmonary disease) (HCC)    Fibromyalgia    Gastric polyp    GERD (gastroesophageal reflux disease)    Hiatal hernia    Hyperparathyroidism (HCC)    IBS (irritable bowel syndrome)    MVP (mitral valve prolapse)    Osteoarthritis    Ovarian cancer (HCC)    lymph node removal with hysterectomy   Raynaud's disease    RLS (restless legs syndrome)    Situational depression    Sjogren's syndrome (HCC)    Vasculitis (HCC)    Vitamin D deficiency     Medications:  See med rec  Assessment: 80 y.o. female with medical history significant for atrial fibrillation on warfarin, sick sinus syndrome with pacemaker, hypertension, congestive cardiomyopathy, prior NSTEMI with thrombus in  distal PLA secondary to subtherapeutic INR. Current INR is 3.3, supratherapeutic. CT of abdomen suspicious for choledocholithiasis and cholangitis.   MD says to restart heparin 10/8 @ 0700. >> Heparin level subtherapeutic, < 0.10.  Will increase Heparin to 1100 units/hr, PLTs low, continue to monitor.  10/9 AM update:  Heparin level just below goal  Goal of Therapy:  Heparin level 0.3-0.7 units/ml Monitor platelets by anticoagulation protocol: Yes   Plan:  Inc heparin to 1200 units/hr Check anti-Xa level in ~8 hours and daily while on heparin Continue to monitor H&H and platelets Likely OR on 10/10  Narda Bonds, PharmD, [place] Pharmacist Phone: [phone]

## 2020-10-22 NOTE — Progress Notes (Signed)
3 Days Post-Op  Subjective: Patient denies any abdominal pain.  Objective: Vital signs in last 24 hours: Temp:  [97.6 F (36.4 C)-98.5 F (36.9 C)] 98.5 F (36.9 C) (10/09 0505) Pulse Rate:  [59-66] 66 (10/09 0910) Resp:  [15-18] 17 (10/09 0505) BP: (111-131)/(64-81) 125/64 (10/09 0910) SpO2:  [97 %-99 %] 97 % (10/09 0505) Last BM Date: 10/19/20  Intake/Output from previous day: 10/08 0701 - 10/09 0700 In: 2819 [P.O.:840; I.V.:1779; IV Piggyback:200] Out: -  Intake/Output this shift: Total I/O In: 360 [P.O.:360] Out: -   General appearance: alert, cooperative, and no distress GI: soft, non-tender; bowel sounds normal; no masses,  no organomegaly  Lab Results:  Recent Labs    10/21/20 0242 10/22/20 0639  WBC 6.6 7.1  HGB 12.2 13.2  HCT 37.9 40.7  PLT 109* 129*   BMET Recent Labs    10/21/20 0242 10/22/20 0639  NA 133* 136  K 4.0 4.0  CL 104 107  CO2 25 25  GLUCOSE 86 81  BUN 13 14  CREATININE 0.61 0.70  CALCIUM 8.1* 8.4*   PT/INR Recent Labs    10/20/20 0525  LABPROT 14.7  INR 1.2    Studies/Results: No results found.  Anti-infectives: Anti-infectives (From admission, onward)    Start     Dose/Rate Route Frequency Ordered Stop   10/17/20 2200  meropenem (MERREM) 1 g in sodium chloride 0.9 % 100 mL IVPB        1 g 200 mL/hr over 30 Minutes Intravenous Every 12 hours 10/17/20 1544     10/17/20 1315  meropenem (MERREM) 1 g in sodium chloride 0.9 % 100 mL IVPB        1 g 200 mL/hr over 30 Minutes Intravenous  Once 10/17/20 1307 10/17/20 1518       Assessment/Plan: s/p Procedure(s): ENDOSCOPIC RETROGRADE CHOLANGIOPANCREATOGRAPHY (ERCP) WITH PROPOFOL ESOPHAGOGASTRODUODENOSCOPY (EGD) WITH PROPOFOL ESOPHAGEAL DILATION SPHINCTEROTOMY STONE EXTRACTION WITH BASKET Impression: Patient is scheduled for a laparoscopic cholecystectomy tomorrow.  The risks and benefits of the procedure were fully explained to the patient, who gave informed consent.   Heparin drip to be stopped at midnight tonight.  LOS: 5 days    [name] Signs 10/22/2020

## 2020-10-22 NOTE — Progress Notes (Signed)
ANTICOAGULATION CONSULT NOTE -   Pharmacy Consult for Heparin Indication: atrial fibrillation  Allergies  Allergen Reactions   Cephalosporins Hives and Shortness Of Breath   Ciprofloxacin Hives and Shortness Of Breath   Diltiazem Shortness Of Breath    Swollen throat    Doxycycline Hives and Shortness Of Breath   Horse-Derived Products Anaphylaxis   Ketek [Telithromycin] Palpitations    Chest discomfort   Nitrofuran Derivatives Anaphylaxis and Hives    blisters   Nitrous Oxide Nausea And Vomiting    Severe due to Sjogrens (Auto-Immune Disease)   Other Anaphylaxis    ALLERGY TO HORSE SERUM   Penicillins Hives and Shortness Of Breath        Pentazocine Other (See Comments)    Other reaction(s): Mental Status Changes (intolerance) Altered Mental Status  Altered Mental Status  Altered Mental Status    Sulfa Antibiotics Hives and Shortness Of Breath   Trovan [Alatrofloxacin] Palpitations, Other (See Comments) and Anaphylaxis    Chest pain, dizziness, irregular pulse   Vitamin B12     Confusion and shaking    Zinc Gelatin [Zinc] Anaphylaxis   Amlodipine Swelling   Calcium Channel Blockers     Respiratory distress   Carvedilol Other (See Comments)    Dizziness, "joint pain, depression"   Clindamycin/Lincomycin     CP, lock jaw   Codeine Nausea Only   Cymbalta [Duloxetine Hcl] Swelling   Diovan [Valsartan] Swelling   Lisinopril Swelling   Sertraline Other (See Comments)    confusion   Tramadol Nausea Only   Loteprednol Etabonate Rash    Patient Measurements: Height: 5\' 4"  (162.6 cm) Weight: 58.3 kg (128 lb 8.5 oz) IBW/kg (Calculated) : 54.7 HEPARIN DW (KG): 49.4   Vital Signs: Temp: 98.5 F (36.9 C) (10/09 0505) BP: 125/64 (10/09 0910) Pulse Rate: 66 (10/09 0910)  Labs: Recent Labs    10/20/20 0525 10/21/20 0242 10/21/20 1511 10/22/20 0054 10/22/20 0639 10/22/20 0942  HGB 13.6 12.2  --   --  13.2  --   HCT 41.0 37.9  --   --  40.7  --   PLT 112*  109*  --   --  129*  --   LABPROT 14.7  --   --   --   --   --   INR 1.2  --   --   --   --   --   HEPARINUNFRC  --   --  <0.10* 0.28*  --  0.41  CREATININE 0.66 0.61  --   --  0.70  --      Estimated Creatinine Clearance: 48.4 mL/min (by C-G formula based on SCr of 0.7 mg/dL).   Medical History: Past Medical History:  Diagnosis Date   A-fib ([place])    Asthma    Cerebral vasculitis    Congestive dilated cardiomyopathy (HCC)    COPD (chronic obstructive pulmonary disease) (HCC)    Fibromyalgia    Gastric polyp    GERD (gastroesophageal reflux disease)    Hiatal hernia    Hyperparathyroidism (HCC)    IBS (irritable bowel syndrome)    MVP (mitral valve prolapse)    Osteoarthritis    Ovarian cancer (HCC)    lymph node removal with hysterectomy   Raynaud's disease    RLS (restless legs syndrome)    Situational depression    Sjogren's syndrome (HCC)    Vasculitis (HCC)    Vitamin D deficiency     Medications:  See med rec  Assessment:  80 y.o. female with medical history significant for atrial fibrillation on warfarin, sick sinus syndrome with pacemaker, hypertension, congestive cardiomyopathy, prior NSTEMI with thrombus in distal PLA secondary to subtherapeutic INR. Current INR is 3.3, supratherapeutic. CT of abdomen suspicious for choledocholithiasis and cholangitis.   MD says to restart heparin 10/8 @ 0700. >> Heparin level subtherapeutic, < 0.10.  Will increase Heparin to 1100 units/hr, PLTs low, continue to monitor.  10/9 AM update:  Heparin level just below goal  Goal of Therapy:  Heparin level 0.3-0.7 units/ml Monitor platelets by anticoagulation protocol: Yes   Plan:  Inc heparin to 1300 units/hr For OR on 10/10 with plan to stop Heparin at 12 midnight tonight  Continue to monitor H&H and platelets  Hart Robinsons, PharmD Clinical Pharmacist Phone: [phone]

## 2020-10-22 NOTE — H&P (View-Only) (Signed)
3 Days Post-Op  Subjective: Patient denies any abdominal pain.  Objective: Vital signs in last 24 hours: Temp:  [97.6 F (36.4 C)-98.5 F (36.9 C)] 98.5 F (36.9 C) (10/09 0505) Pulse Rate:  [59-66] 66 (10/09 0910) Resp:  [15-18] 17 (10/09 0505) BP: (111-131)/(64-81) 125/64 (10/09 0910) SpO2:  [97 %-99 %] 97 % (10/09 0505) Last BM Date: 10/19/20  Intake/Output from previous day: 10/08 0701 - 10/09 0700 In: 2819 [P.O.:840; I.V.:1779; IV Piggyback:200] Out: -  Intake/Output this shift: Total I/O In: 360 [P.O.:360] Out: -   General appearance: alert, cooperative, and no distress GI: soft, non-tender; bowel sounds normal; no masses,  no organomegaly  Lab Results:  Recent Labs    10/21/20 0242 10/22/20 0639  WBC 6.6 7.1  HGB 12.2 13.2  HCT 37.9 40.7  PLT 109* 129*   BMET Recent Labs    10/21/20 0242 10/22/20 0639  NA 133* 136  K 4.0 4.0  CL 104 107  CO2 25 25  GLUCOSE 86 81  BUN 13 14  CREATININE 0.61 0.70  CALCIUM 8.1* 8.4*   PT/INR Recent Labs    10/20/20 0525  LABPROT 14.7  INR 1.2    Studies/Results: No results found.  Anti-infectives: Anti-infectives (From admission, onward)    Start     Dose/Rate Route Frequency Ordered Stop   10/17/20 2200  meropenem (MERREM) 1 g in sodium chloride 0.9 % 100 mL IVPB        1 g 200 mL/hr over 30 Minutes Intravenous Every 12 hours 10/17/20 1544     10/17/20 1315  meropenem (MERREM) 1 g in sodium chloride 0.9 % 100 mL IVPB        1 g 200 mL/hr over 30 Minutes Intravenous  Once 10/17/20 1307 10/17/20 1518       Assessment/Plan: s/p Procedure(s): ENDOSCOPIC RETROGRADE CHOLANGIOPANCREATOGRAPHY (ERCP) WITH PROPOFOL ESOPHAGOGASTRODUODENOSCOPY (EGD) WITH PROPOFOL ESOPHAGEAL DILATION SPHINCTEROTOMY STONE EXTRACTION WITH BASKET Impression: Patient is scheduled for a laparoscopic cholecystectomy tomorrow.  The risks and benefits of the procedure were fully explained to the patient, who gave informed consent.   Heparin drip to be stopped at midnight tonight.  LOS: 5 days    [name] Signs 10/22/2020

## 2020-10-22 NOTE — Progress Notes (Signed)
PROGRESS NOTE    Angela Wong  MOQ:[id number] DOB: 1940/01/17 DOA: 10/17/2020 PCP: [name], FNP   Brief Narrative:   Angela Wong is a 80 y.o. female with medical history significant for atrial fibrillation on warfarin, sick sinus syndrome with pacemaker, hypertension, congestive cardiomyopathy, prior NSTEMI with thrombus in distal PLA secondary to subtherapeutic INR, Sjogren's syndrome on chronic prednisone, hypothyroidism, and COPD who presented to the ED with worsening nausea, vomiting, diarrhea that appears to have began over the last few days.  Patient was admitted with acute cholangitis with choledocholithiasis and has undergone ERCP with stone extraction on 10/6 and is now awaiting laparoscopic cholecystectomy on 10/10.  Assessment & Plan:   Active Problems:   Choledocholithiasis   Calculus of gallbladder with acute cholecystitis and obstruction   Pain   Acute choledocholithiasis with cholangitis -Status post ERCP with stone extraction on 10/6 -General surgery planning for laparoscopic cholecystectomy 10/10 -N.p.o. after midnight with heparin drip to be held as well -Currently on heart healthy diet -Symptomatic management -Trend LFTs with downward trend noted -Continue on Merrem for empiric coverage -Hold IVF   Transaminitis secondary to above-improved -Now noted to be decreasing -Continue to trend -Plan for CCY 10/10   Spinal stenosis -Ongoing low back pain with oxycodone scheduled -Fentanyl for breakthrough pain -Muscle relaxers now scheduled   Recent dysuria -Continue on Merrem as noted above -UA with no signs of UTI   History of atrial fibrillation -INR currently subtherapeutic at 1.3 to allow for ERCP -Currently on heparin drip which will be held at midnight for anticipated procedure in a.m. -Monitor on telemetry -Continue atenolol -Currently rate controlled   History of COPD -No acute bronchospasms -Breathing treatments as needed    History of hypothyroidism -Continue levothyroxine   History of Sjogren's syndrome -Continue on prednisone and Restasis     DVT prophylaxis: Heparin drip Code Status: Full Family Communication: Discussed with son Angela Wong on phone 10/7 Disposition Plan:  Status is: Inpatient   Remains inpatient appropriate because:Ongoing diagnostic testing needed not appropriate for outpatient work up, IV treatments appropriate due to intensity of illness or inability to take PO, and Inpatient level of care appropriate due to severity of illness   Dispo: The patient is from: Home              Anticipated d/c is to: Home              Patient currently is not medically stable to d/c.              Difficult to place patient No     Skin Assessment:   I have examined the patient's skin and I agree with the wound assessment as performed by the wound care RN as outlined below:   Pressure Injury 06/22/19 Sacrum Left Stage 2 -  Partial thickness loss of dermis presenting as a shallow open injury with a red, pink wound bed without slough. (Active)  06/22/19 2102  Location: Sacrum  Location Orientation: Left  Staging: Stage 2 -  Partial thickness loss of dermis presenting as a shallow open injury with a red, pink wound bed without slough.  Wound Description (Comments):   Present on Admission: Yes      Consultants:  GI General Surgery   Procedures:  ERCP with stone extraction 10/6  Antimicrobials:  Anti-infectives (From admission, onward)    Start     Dose/Rate Route Frequency Ordered Stop   10/17/20 2200  meropenem (MERREM) 1 g in  sodium chloride 0.9 % 100 mL IVPB        1 g 200 mL/hr over 30 Minutes Intravenous Every 12 hours 10/17/20 1544     10/17/20 1315  meropenem (MERREM) 1 g in sodium chloride 0.9 % 100 mL IVPB        1 g 200 mL/hr over 30 Minutes Intravenous  Once 10/17/20 1307 10/17/20 1518       Subjective: Patient seen and evaluated today with no new acute complaints or  concerns. No acute concerns or events noted overnight.  She states that her back pain has improved and that she has minimal wheezing to her right chest.  Objective: Vitals:   10/21/20 1430 10/21/20 2038 10/22/20 0505 10/22/20 0910  BP: 111/75 122/79 122/81 125/64  Pulse: (!) 59 (!) 59 60 66  Resp: 15 18 17    Temp: 98.4 F (36.9 C) 97.6 F (36.4 C) 98.5 F (36.9 C)   TempSrc:      SpO2: 97% 99% 97%   Weight:      Height:        Intake/Output Summary (Last 24 hours) at 10/22/2020 0914 Last data filed at 10/22/2020 0859 Gross per 24 hour  Intake 2818.98 ml  Output --  Net 2818.98 ml   Filed Weights   10/17/20 0755 10/18/20 0318  Weight: 49.4 kg 58.3 kg    Examination:  General exam: Appears calm and comfortable  Respiratory system: Clear to auscultation. Respiratory effort normal. Cardiovascular system: S1 & S2 heard, RRR.  Gastrointestinal system: Abdomen is soft Central nervous system: Alert and awake Extremities: No edema Skin: No significant lesions noted Psychiatry: Flat affect.    Data Reviewed: I have personally reviewed following labs and imaging studies  CBC: Recent Labs  Lab 10/17/20 0833 10/18/20 0528 10/19/20 0503 10/20/20 0525 10/21/20 0242 10/22/20 0639  WBC 16.3* 19.9* 12.7* 10.3 6.6 7.1  NEUTROABS 15.3*  --   --   --   --   --   HGB 14.2 12.8 13.4 13.6 12.2 13.2  HCT 45.2 40.2 40.4 41.0 37.9 40.7  MCV 100.4* 99.8 98.5 98.6 98.2 99.0  PLT 162 121* 124* 112* 109* 122*   Basic Metabolic Panel: Recent Labs  Lab 10/18/20 0528 10/19/20 0503 10/20/20 0525 10/21/20 0242 10/22/20 0639  NA 133* 134* 133* 133* 136  K 4.6 4.6 4.1 4.0 4.0  CL 102 105 104 104 107  CO2 25 24 22 25 25   GLUCOSE 72 93 80 86 81  BUN 17 22 19 13 14   CREATININE 0.74 0.72 0.66 0.61 0.70  CALCIUM 8.1* 8.5* 8.4* 8.1* 8.4*  MG 1.8 1.9 1.6* 1.9 1.7   GFR: Estimated Creatinine Clearance: 48.4 mL/min (by C-G formula based on SCr of 0.7 mg/dL). Liver Function  Tests: Recent Labs  Lab 10/18/20 0528 10/19/20 0503 10/20/20 0525 10/21/20 0242 10/22/20 0639  AST 189* 93* 44* 28 18  ALT 204* 152* 109* 74* 59*  ALKPHOS 217* 242* 284* 224* 213*  BILITOT 2.6* 2.1* 3.2* 2.1* 1.5*  PROT 4.9* 5.0* 5.1* 4.4* 5.1*  ALBUMIN 2.8* 2.8* 2.9* 2.5* 2.8*   Recent Labs  Lab 10/17/20 0833 10/20/20 0525  LIPASE 27  --   AMYLASE  --  43   No results for input(s): AMMONIA in the last 168 hours. Coagulation Profile: Recent Labs  Lab 10/17/20 0833 10/18/20 0528 10/19/20 0503 10/20/20 0525  INR 3.3* 3.5* 1.3* 1.2   Cardiac Enzymes: No results for input(s): CKTOTAL, CKMB, CKMBINDEX, TROPONINI in the  last 168 hours. BNP (last 3 results) No results for input(s): PROBNP in the last 8760 hours. HbA1C: No results for input(s): HGBA1C in the last 72 hours. CBG: No results for input(s): GLUCAP in the last 168 hours. Lipid Profile: No results for input(s): CHOL, HDL, LDLCALC, TRIG, CHOLHDL, LDLDIRECT in the last 72 hours. Thyroid Function Tests: No results for input(s): TSH, T4TOTAL, FREET4, T3FREE, THYROIDAB in the last 72 hours. Anemia Panel: No results for input(s): VITAMINB12, FOLATE, FERRITIN, TIBC, IRON, RETICCTPCT in the last 72 hours. Sepsis Labs: Recent Labs  Lab 10/17/20 1317  LATICACIDVEN 1.8    Recent Results (from the past 240 hour(s))  Blood culture (routine x 2)     Status: Abnormal   Collection Time: 10/17/20  1:17 PM   Specimen: BLOOD RIGHT HAND  Result Value Ref Range Status   Specimen Description   Final    BLOOD RIGHT HAND Performed at [facility], [address] [place], [place] [address]    Special Requests   Final    Blood Culture results may not be optimal due to an excessive volume of blood received in culture bottles BOTTLES DRAWN AEROBIC AND ANAEROBIC Performed at [facility], [address] [place], [place] [address]    Culture  Setup Time   Final    GRAM NEGATIVE RODS IN BOTH AEROBIC AND ANAEROBIC BOTTLES Gram  Stain Report Called to,Read Back By and Verified With: MORRIS,B AT 0945 ON 10.6.22 BY RUCINSKI,B Organism ID to follow CRITICAL RESULT CALLED TO, READ BACK BY AND VERIFIED WITHDarnell Level COFFEE PHARMD 1427 10/19/20 A BROWNING Performed at [facility], [address], [place], [place] [address]    Culture ESCHERICHIA COLI (A)  Final   Report Status 10/21/2020 FINAL  Final   Organism ID, Bacteria ESCHERICHIA COLI  Final      Susceptibility   Escherichia coli - MIC*    AMPICILLIN <=2 SENSITIVE Sensitive     CEFAZOLIN <=4 SENSITIVE Sensitive     CEFEPIME <=0.12 SENSITIVE Sensitive     CEFTAZIDIME <=1 SENSITIVE Sensitive     CEFTRIAXONE <=0.25 SENSITIVE Sensitive     CIPROFLOXACIN <=0.25 SENSITIVE Sensitive     GENTAMICIN <=1 SENSITIVE Sensitive     IMIPENEM <=0.25 SENSITIVE Sensitive     TRIMETH/SULFA <=20 SENSITIVE Sensitive     AMPICILLIN/SULBACTAM <=2 SENSITIVE Sensitive     PIP/TAZO <=4 SENSITIVE Sensitive     * ESCHERICHIA COLI  Blood culture (routine x 2)     Status: None (Preliminary result)   Collection Time: 10/17/20  1:17 PM   Specimen: BLOOD LEFT FOREARM  Result Value Ref Range Status   Specimen Description BLOOD LEFT FOREARM  Final   Special Requests   Final    Blood Culture results may not be optimal due to an excessive volume of blood received in culture bottles BOTTLES DRAWN AEROBIC AND ANAEROBIC   Culture   Final    NO GROWTH 4 DAYS Performed at [facility], [address], [place], [place] [address]    Report Status PENDING  Incomplete  Blood Culture ID Panel (Reflexed)     Status: Abnormal   Collection Time: 10/17/20  1:17 PM  Result Value Ref Range Status   Enterococcus faecalis NOT DETECTED NOT DETECTED Final   Enterococcus Faecium NOT DETECTED NOT DETECTED Final   Listeria monocytogenes NOT DETECTED NOT DETECTED Final   Staphylococcus species NOT DETECTED NOT DETECTED Final   Staphylococcus aureus (BCID) NOT DETECTED NOT DETECTED Final   Staphylococcus epidermidis  NOT DETECTED NOT DETECTED Final   Staphylococcus lugdunensis NOT DETECTED NOT DETECTED Final   Streptococcus species NOT DETECTED NOT DETECTED Final   Streptococcus agalactiae NOT DETECTED NOT DETECTED Final   Streptococcus pneumoniae NOT DETECTED NOT DETECTED Final   Streptococcus pyogenes NOT DETECTED NOT DETECTED Final   A.calcoaceticus-baumannii NOT DETECTED NOT DETECTED Final   Bacteroides fragilis NOT DETECTED NOT DETECTED Final   Enterobacterales DETECTED (A) NOT DETECTED Final    Comment: Enterobacterales represent a large order of gram negative bacteria, not a single organism. CRITICAL RESULT CALLED TO, READ BACK BY AND VERIFIED WITH: G COFFEE PHARMD 1427 10/19/20 A BROWNING    Enterobacter cloacae complex NOT DETECTED NOT DETECTED Final   Escherichia coli DETECTED (A) NOT DETECTED Final    Comment: CRITICAL RESULT CALLED TO, READ BACK BY AND VERIFIED WITH: G COFFEE PHARMD 1427 10/19/20 A BROWNING    Klebsiella aerogenes NOT DETECTED NOT DETECTED Final   Klebsiella oxytoca NOT DETECTED NOT DETECTED Final   Klebsiella pneumoniae NOT DETECTED NOT DETECTED Final   Proteus species NOT DETECTED NOT DETECTED Final   Salmonella species NOT DETECTED NOT DETECTED Final   Serratia marcescens NOT DETECTED NOT DETECTED Final   Haemophilus influenzae NOT DETECTED NOT DETECTED Final   Neisseria meningitidis NOT DETECTED NOT DETECTED Final   Pseudomonas aeruginosa NOT DETECTED NOT DETECTED Final   Stenotrophomonas maltophilia NOT DETECTED NOT DETECTED Final   Candida albicans NOT DETECTED NOT DETECTED Final   Candida auris NOT DETECTED NOT DETECTED Final   Candida glabrata NOT DETECTED NOT DETECTED Final   Candida krusei NOT DETECTED NOT DETECTED Final   Candida parapsilosis NOT DETECTED NOT DETECTED Final   Candida tropicalis NOT DETECTED NOT DETECTED Final   Cryptococcus neoformans/gattii NOT DETECTED NOT DETECTED Final   CTX-M ESBL NOT DETECTED NOT DETECTED Final   Carbapenem  resistance IMP NOT DETECTED NOT DETECTED Final   Carbapenem resistance KPC NOT DETECTED NOT DETECTED Final   Carbapenem resistance NDM NOT DETECTED NOT DETECTED Final   Carbapenem resist OXA 48 LIKE NOT DETECTED NOT DETECTED Final   Carbapenem resistance VIM NOT DETECTED NOT DETECTED Final    Comment: Performed at [facility], [address] [place], [place] [address]  Resp Panel by RT-PCR (Flu A&B, Covid) Nasopharyngeal Swab     Status: None   Collection Time: 10/17/20  1:49 PM   Specimen: Nasopharyngeal Swab; Nasopharyngeal(NP) swabs in vial transport medium  Result Value Ref Range Status   SARS Coronavirus 2 by RT PCR NEGATIVE NEGATIVE Final    Comment: (NOTE) SARS-CoV-2 target nucleic acids are NOT DETECTED.  The SARS-CoV-2 RNA is generally detectable in upper respiratory specimens during the acute phase of infection. The lowest concentration of SARS-CoV-2 viral copies this assay can detect is 138 copies/mL. A negative result does not preclude SARS-Cov-2 infection and should not be used as the sole basis for treatment or other patient management decisions. A negative result may occur with  improper specimen collection/handling, submission of specimen other than nasopharyngeal swab, presence of viral mutation(s) within the areas targeted by this assay, and inadequate number of viral copies(<138 copies/mL). A negative result must be combined with clinical observations, patient history, and epidemiological information. The expected result is Negative.  Fact Sheet for Patients:  EntrepreneurPulse.com.au  Fact Sheet for Healthcare Providers:  IncredibleEmployment.be  This test is no t yet approved or cleared by the [place] FDA and  has been authorized for detection and/or diagnosis of SARS-CoV-2 by FDA under  an Emergency Use Authorization (EUA). This EUA will remain  in effect (meaning this test can be used) for the duration of  the COVID-19 declaration under Section 564(b)(1) of the Act, 21 U.S.C.section 360bbb-3(b)(1), unless the authorization is terminated  or revoked sooner.       Influenza A by PCR NEGATIVE NEGATIVE Final   Influenza B by PCR NEGATIVE NEGATIVE Final    Comment: (NOTE) The Xpert Xpress SARS-CoV-2/FLU/RSV plus assay is intended as an aid in the diagnosis of influenza from Nasopharyngeal swab specimens and should not be used as a sole basis for treatment. Nasal washings and aspirates are unacceptable for Xpert Xpress SARS-CoV-2/FLU/RSV testing.  Fact Sheet for Patients: EntrepreneurPulse.com.au  Fact Sheet for Healthcare Providers: IncredibleEmployment.be  This test is not yet approved or cleared by the [place] FDA and has been authorized for detection and/or diagnosis of SARS-CoV-2 by FDA under an Emergency Use Authorization (EUA). This EUA will remain in effect (meaning this test can be used) for the duration of the COVID-19 declaration under Section 564(b)(1) of the Act, 21 U.S.C. section 360bbb-3(b)(1), unless the authorization is terminated or revoked.  Performed at [facility], [address] [place], [place] [address]   Surgical pcr screen     Status: None   Collection Time: 10/21/20 12:06 AM   Specimen: Nasal Mucosa; Nasal Swab  Result Value Ref Range Status   MRSA, PCR NEGATIVE NEGATIVE Final   Staphylococcus aureus NEGATIVE NEGATIVE Final    Comment: (NOTE) The Xpert SA Assay (FDA approved for NASAL specimens in patients 11 years of age and older), is one component of a comprehensive surveillance program. It is not intended to diagnose infection nor to guide or monitor treatment. Performed at [facility], [address] [place], [place] [address]          Radiology Studies: No results found.      Scheduled Meds:  atenolol  12.5 mg Oral BID   cycloSPORINE  1 drop Both Eyes BID   levothyroxine  100 mcg Oral q  morning   loratadine  10 mg Oral Daily   methocarbamol  750 mg Oral QID   oxyCODONE-acetaminophen  1 tablet Oral Q6H   pantoprazole (PROTONIX) IV  40 mg Intravenous Q24H   predniSONE  5 mg Oral q morning   Continuous Infusions:  heparin 1,200 Units/hr (10/22/20 0628)   meropenem (MERREM) IV 1 g (10/21/20 2211)     LOS: 5 days    Time spent: 35 minutes    Daymen Hassebrock Darleen Crocker, DO [facility]  If 7PM-7AM, please contact night-coverage [email or web address] 10/22/2020, 9:14 AM

## 2020-10-23 ENCOUNTER — Inpatient Hospital Stay (HOSPITAL_COMMUNITY): Payer: Medicare Other | Admitting: Certified Registered Nurse Anesthetist

## 2020-10-23 ENCOUNTER — Encounter (HOSPITAL_COMMUNITY)
Admission: EM | Disposition: A | Discharge status: Skilled Nursing Facility | Payer: Self-pay | Source: Home / Self Care | Attending: Internal Medicine

## 2020-10-23 ENCOUNTER — Encounter (HOSPITAL_COMMUNITY): Payer: Self-pay | Admitting: Internal Medicine

## 2020-10-23 DIAGNOSIS — K8001 Calculus of gallbladder with acute cholecystitis with obstruction: Secondary | ICD-10-CM | POA: Diagnosis not present

## 2020-10-23 DIAGNOSIS — K8050 Calculus of bile duct without cholangitis or cholecystitis without obstruction: Secondary | ICD-10-CM | POA: Diagnosis not present

## 2020-10-23 HISTORY — PX: CHOLECYSTECTOMY: SHX55

## 2020-10-23 LAB — CBC
HCT: 37.7 % (ref 36.0–46.0)
Hemoglobin: 12.2 g/dL (ref 12.0–15.0)
MCH: 32.4 pg (ref 26.0–34.0)
MCHC: 32.4 g/dL (ref 30.0–36.0)
MCV: 100.3 fL — ABNORMAL HIGH (ref 80.0–100.0)
Platelets: 145 10*3/uL — ABNORMAL LOW (ref 150–400)
RBC: 3.76 MIL/uL — ABNORMAL LOW (ref 3.87–5.11)
RDW: 15 % (ref 11.5–15.5)
WBC: 6 10*3/uL (ref 4.0–10.5)
nRBC: 0 % (ref 0.0–0.2)

## 2020-10-23 LAB — COMPREHENSIVE METABOLIC PANEL
ALT: 45 U/L — ABNORMAL HIGH (ref 0–44)
AST: 15 U/L (ref 15–41)
Albumin: 2.6 g/dL — ABNORMAL LOW (ref 3.5–5.0)
Alkaline Phosphatase: 174 U/L — ABNORMAL HIGH (ref 38–126)
Anion gap: 5 (ref 5–15)
BUN: 13 mg/dL (ref 8–23)
CO2: 26 mmol/L (ref 22–32)
Calcium: 7.9 mg/dL — ABNORMAL LOW (ref 8.9–10.3)
Chloride: 104 mmol/L (ref 98–111)
Creatinine, Ser: 0.68 mg/dL (ref 0.44–1.00)
GFR, Estimated: >60 mL/min (ref >60)
Glucose, Bld: 76 mg/dL (ref 70–99)
Potassium: 4.1 mmol/L (ref 3.5–5.1)
Sodium: 135 mmol/L (ref 135–145)
Total Bilirubin: 1.1 mg/dL (ref 0.3–1.2)
Total Protein: 4.6 g/dL — ABNORMAL LOW (ref 6.5–8.1)

## 2020-10-23 LAB — MAGNESIUM: Magnesium: 1.7 mg/dL (ref 1.7–2.4)

## 2020-10-23 LAB — CULTURE, BLOOD (ROUTINE X 2): Culture: NO GROWTH

## 2020-10-23 SURGERY — LAPAROSCOPIC CHOLECYSTECTOMY
Anesthesia: General | Site: Abdomen

## 2020-10-23 MED ORDER — SODIUM CHLORIDE 0.9 % IV SOLN: INTRAVENOUS | Status: DC

## 2020-10-23 MED ORDER — FENTANYL CITRATE (PF) 250 MCG/5ML IJ SOLN
INTRAMUSCULAR | Status: AC
  Filled 2020-10-23: qty 5

## 2020-10-23 MED ORDER — CHLORHEXIDINE GLUCONATE CLOTH 2 % EX PADS: 6.0000 | MEDICATED_PAD | Freq: Once | CUTANEOUS | Status: DC

## 2020-10-23 MED ORDER — PROPOFOL 10 MG/ML IV BOLUS
INTRAVENOUS | Status: DC | PRN
  Administered 2020-10-23: 90 mg via INTRAVENOUS

## 2020-10-23 MED ORDER — HEMOSTATIC AGENTS (NO CHARGE) OPTIME
TOPICAL | Status: DC | PRN
  Administered 2020-10-23: 1 via TOPICAL

## 2020-10-23 MED ORDER — ONDANSETRON HCL 4 MG/2ML IJ SOLN
4.0000 mg | Freq: Once | INTRAMUSCULAR | Status: AC
  Administered 2020-10-23: 4 mg via INTRAVENOUS

## 2020-10-23 MED ORDER — BUPIVACAINE LIPOSOME 1.3 % IJ SUSP
INTRAMUSCULAR | Status: DC | PRN
  Administered 2020-10-23: 10 mL

## 2020-10-23 MED ORDER — IPRATROPIUM-ALBUTEROL 0.5-2.5 (3) MG/3ML IN SOLN
3.0000 mL | Freq: Once | RESPIRATORY_TRACT | Status: AC
  Administered 2020-10-23: 3 mL via RESPIRATORY_TRACT

## 2020-10-23 MED ORDER — SODIUM CHLORIDE 0.9 % IR SOLN
Status: DC | PRN
  Administered 2020-10-23: 1000 mL

## 2020-10-23 MED ORDER — HYDROMORPHONE HCL 1 MG/ML IJ SOLN
0.2500 mg | INTRAMUSCULAR | Status: DC | PRN
  Administered 2020-10-23 (×4): 0.25 mg via INTRAVENOUS
  Filled 2020-10-23 (×2): qty 0.5

## 2020-10-23 MED ORDER — ONDANSETRON HCL 4 MG/2ML IJ SOLN
INTRAMUSCULAR | Status: AC
  Filled 2020-10-23: qty 2

## 2020-10-23 MED ORDER — SIMETHICONE 80 MG PO CHEW: 40.0000 mg | CHEWABLE_TABLET | Freq: Four times a day (QID) | ORAL | Status: DC | PRN

## 2020-10-23 MED ORDER — HYDROMORPHONE HCL 1 MG/ML IJ SOLN
INTRAMUSCULAR | Status: AC
  Filled 2020-10-23: qty 0.5

## 2020-10-23 MED ORDER — SODIUM CHLORIDE 0.9 % IV SOLN
12.5000 mg | Freq: Four times a day (QID) | INTRAVENOUS | Status: DC | PRN
  Administered 2020-10-23: 12.5 mg via INTRAVENOUS
  Filled 2020-10-23: qty 0.5

## 2020-10-23 MED ORDER — LACTATED RINGERS IV SOLN: INTRAVENOUS | Status: DC

## 2020-10-23 MED ORDER — ONDANSETRON HCL 4 MG/2ML IJ SOLN
INTRAMUSCULAR | Status: DC | PRN
  Administered 2020-10-23: 4 mg via INTRAVENOUS

## 2020-10-23 MED ORDER — IPRATROPIUM-ALBUTEROL 0.5-2.5 (3) MG/3ML IN SOLN
RESPIRATORY_TRACT | Status: AC
  Filled 2020-10-23: qty 3

## 2020-10-23 MED ORDER — BUPIVACAINE LIPOSOME 1.3 % IJ SUSP
INTRAMUSCULAR | Status: AC
  Filled 2020-10-23: qty 20

## 2020-10-23 MED ORDER — SUCCINYLCHOLINE CHLORIDE 200 MG/10ML IV SOSY
PREFILLED_SYRINGE | INTRAVENOUS | Status: DC | PRN
  Administered 2020-10-23: 100 mg via INTRAVENOUS

## 2020-10-23 MED ORDER — EPHEDRINE SULFATE 50 MG/ML IJ SOLN
INTRAMUSCULAR | Status: DC | PRN
  Administered 2020-10-23: 5 mg via INTRAVENOUS

## 2020-10-23 MED ORDER — DEXAMETHASONE SODIUM PHOSPHATE 10 MG/ML IJ SOLN
INTRAMUSCULAR | Status: DC | PRN
  Administered 2020-10-23: 10 mg via INTRAVENOUS

## 2020-10-23 MED ORDER — SODIUM CHLORIDE 0.9 % IV SOLN
1.0000 g | Freq: Two times a day (BID) | INTRAVENOUS | Status: AC
  Administered 2020-10-23 (×2): 1 g via INTRAVENOUS
  Filled 2020-10-23 (×2): qty 1

## 2020-10-23 MED ORDER — ROCURONIUM BROMIDE 10 MG/ML (PF) SYRINGE
PREFILLED_SYRINGE | INTRAVENOUS | Status: DC | PRN
  Administered 2020-10-23: 30 mg via INTRAVENOUS

## 2020-10-23 MED ORDER — HEPARIN (PORCINE) 25000 UT/250ML-% IV SOLN: 1300.0000 [IU]/h | INTRAVENOUS | Status: DC

## 2020-10-23 MED ORDER — LIDOCAINE HCL (CARDIAC) PF 100 MG/5ML IV SOSY
PREFILLED_SYRINGE | INTRAVENOUS | Status: DC | PRN
  Administered 2020-10-23: 60 mg via INTRATRACHEAL

## 2020-10-23 MED ORDER — LIDOCAINE HCL (PF) 2 % IJ SOLN
INTRAMUSCULAR | Status: AC
  Filled 2020-10-23: qty 5

## 2020-10-23 MED ORDER — ORAL CARE MOUTH RINSE
15.0000 mL | Freq: Once | OROMUCOSAL | Status: AC
  Administered 2020-10-23: 15 mL via OROMUCOSAL

## 2020-10-23 MED ORDER — PROPOFOL 10 MG/ML IV BOLUS
INTRAVENOUS | Status: AC
  Filled 2020-10-23: qty 20

## 2020-10-23 MED ORDER — FENTANYL CITRATE (PF) 100 MCG/2ML IJ SOLN
INTRAMUSCULAR | Status: DC | PRN
  Administered 2020-10-23 (×4): 50 ug via INTRAVENOUS

## 2020-10-23 MED ORDER — SUGAMMADEX SODIUM 500 MG/5ML IV SOLN
INTRAVENOUS | Status: DC | PRN
  Administered 2020-10-23: 200 mg via INTRAVENOUS

## 2020-10-23 MED ORDER — CHLORHEXIDINE GLUCONATE 0.12 % MT SOLN: 15.0000 mL | Freq: Once | OROMUCOSAL | Status: AC

## 2020-10-23 MED ORDER — ROCURONIUM BROMIDE 10 MG/ML (PF) SYRINGE
PREFILLED_SYRINGE | INTRAVENOUS | Status: AC
  Filled 2020-10-23: qty 10

## 2020-10-23 MED ORDER — HEPARIN (PORCINE) 25000 UT/250ML-% IV SOLN
1300.0000 [IU]/h | INTRAVENOUS | Status: DC
  Administered 2020-10-24: 1300 [IU]/h via INTRAVENOUS
  Filled 2020-10-23: qty 250

## 2020-10-23 MED ORDER — SUCCINYLCHOLINE CHLORIDE 200 MG/10ML IV SOSY
PREFILLED_SYRINGE | INTRAVENOUS | Status: AC
  Filled 2020-10-23: qty 10

## 2020-10-23 MED ORDER — EPHEDRINE 5 MG/ML INJ
INTRAVENOUS | Status: AC
  Filled 2020-10-23: qty 5

## 2020-10-23 SURGICAL SUPPLY — 41 items
APPLICATOR ARISTA FLEXITIP XL (MISCELLANEOUS) ×1 IMPLANT
APPLIER CLIP ROT 10 11.4 M/L (STAPLE) ×2
BAG RETRIEVAL 10 (BASKET) ×1
CHLORAPREP W/TINT 26 (MISCELLANEOUS) ×2 IMPLANT
CLIP APPLIE ROT 10 11.4 M/L (STAPLE) ×1 IMPLANT
CLOTH BEACON ORANGE TIMEOUT ST (SAFETY) ×2 IMPLANT
COVER LIGHT HANDLE STERIS (MISCELLANEOUS) ×4 IMPLANT
DERMABOND ADVANCED (GAUZE/BANDAGES/DRESSINGS) ×1
DERMABOND ADVANCED .7 DNX12 (GAUZE/BANDAGES/DRESSINGS) ×1 IMPLANT
ELECT REM PT RETURN 9FT ADLT (ELECTROSURGICAL) ×2
ELECTRODE REM PT RTRN 9FT ADLT (ELECTROSURGICAL) ×1 IMPLANT
GLOVE SURG POLYISO LF SZ7.5 (GLOVE) ×2 IMPLANT
GLOVE SURG UNDER POLY LF SZ7 (GLOVE) ×4 IMPLANT
GOWN STRL REUS W/TWL LRG LVL3 (GOWN DISPOSABLE) ×6 IMPLANT
HEMOSTAT ARISTA ABSORB 3G PWDR (HEMOSTASIS) ×1 IMPLANT
HEMOSTAT SNOW SURGICEL 2X4 (HEMOSTASIS) ×2 IMPLANT
INST SET LAPROSCOPIC AP (KITS) ×2 IMPLANT
KIT TURNOVER KIT A (KITS) ×2 IMPLANT
MANIFOLD NEPTUNE II (INSTRUMENTS) ×2 IMPLANT
NDL HYPO 18GX1.5 BLUNT FILL (NEEDLE) ×1 IMPLANT
NDL HYPO 21X1.5 SAFETY (NEEDLE) ×1 IMPLANT
NDL INSUFFLATION 14GA 120MM (NEEDLE) ×1 IMPLANT
NEEDLE HYPO 18GX1.5 BLUNT FILL (NEEDLE) ×2 IMPLANT
NEEDLE HYPO 21X1.5 SAFETY (NEEDLE) ×2 IMPLANT
NEEDLE INSUFFLATION 14GA 120MM (NEEDLE) ×2 IMPLANT
NS IRRIG 1000ML POUR BTL (IV SOLUTION) ×2 IMPLANT
PACK LAP CHOLE LZT030E (CUSTOM PROCEDURE TRAY) ×2 IMPLANT
PAD ARMBOARD 7.5X6 YLW CONV (MISCELLANEOUS) ×2 IMPLANT
SET BASIN LINEN APH (SET/KITS/TRAYS/PACK) ×2 IMPLANT
SET TUBE SMOKE EVAC HIGH FLOW (TUBING) ×2 IMPLANT
SLEEVE ENDOPATH XCEL 5M (ENDOMECHANICALS) ×2 IMPLANT
SUT MNCRL AB 4-0 PS2 18 (SUTURE) ×4 IMPLANT
SUT VICRYL 0 UR6 27IN ABS (SUTURE) ×2 IMPLANT
SYR 20ML LL LF (SYRINGE) ×4 IMPLANT
SYS BAG RETRIEVAL 10MM (BASKET) ×1
SYSTEM BAG RETRIEVAL 10MM (BASKET) ×1 IMPLANT
TROCAR ENDO BLADELESS 11MM (ENDOMECHANICALS) ×2 IMPLANT
TROCAR XCEL NON-BLD 5MMX100MML (ENDOMECHANICALS) ×2 IMPLANT
TROCAR XCEL UNIV SLVE 11M 100M (ENDOMECHANICALS) ×2 IMPLANT
TUBE CONNECTING 12X1/4 (SUCTIONS) ×2 IMPLANT
WARMER LAPAROSCOPE (MISCELLANEOUS) ×2 IMPLANT

## 2020-10-23 NOTE — Progress Notes (Signed)
ANTICOAGULATION CONSULT NOTE - Initial Consult  Pharmacy Consult for heparin gtt and warfarin bridge  Indication: atrial fibrillation  Allergies  Allergen Reactions   Cephalosporins Hives and Shortness Of Breath   Ciprofloxacin Hives and Shortness Of Breath   Diltiazem Shortness Of Breath    Swollen throat    Doxycycline Hives and Shortness Of Breath   Horse-Derived Products Anaphylaxis   Ketek [Telithromycin] Palpitations    Chest discomfort   Nitrofuran Derivatives Anaphylaxis and Hives    blisters   Nitrous Oxide Nausea And Vomiting    Severe due to Sjogrens (Auto-Immune Disease)   Other Anaphylaxis    ALLERGY TO HORSE SERUM   Penicillins Hives and Shortness Of Breath        Pentazocine Other (See Comments)    Other reaction(s): Mental Status Changes (intolerance) Altered Mental Status  Altered Mental Status  Altered Mental Status    Sulfa Antibiotics Hives and Shortness Of Breath   Trovan [Alatrofloxacin] Palpitations, Other (See Comments) and Anaphylaxis    Chest pain, dizziness, irregular pulse   Vitamin B12     Confusion and shaking    Zinc Gelatin [Zinc] Anaphylaxis   Amlodipine Swelling   Calcium Channel Blockers     Respiratory distress   Carvedilol Other (See Comments)    Dizziness, "joint pain, depression"   Clindamycin/Lincomycin     CP, lock jaw   Codeine Nausea Only   Cymbalta [Duloxetine Hcl] Swelling   Diovan [Valsartan] Swelling   Lisinopril Swelling   Sertraline Other (See Comments)    confusion   Tramadol Nausea Only   Loteprednol Etabonate Rash    Patient Measurements: Height: 5\' 4"  (162.6 cm) Weight: 58.3 kg (128 lb 8.5 oz) IBW/kg (Calculated) : 54.7 Heparin Dosing Weight: HEPARIN DW (KG): 49.4   Vital Signs: Temp: 97.7 F (36.5 C) (10/10 1103) Temp Source: Oral (10/10 1103) BP: 172/72 (10/10 1103) Pulse Rate: 71 (10/10 1103)  Labs: Recent Labs    10/21/20 0242 10/21/20 1511 10/22/20 0054 10/22/20 0639 10/22/20 0942  10/23/20 0431  HGB 12.2  --   --  13.2  --  12.2  HCT 37.9  --   --  40.7  --  37.7  PLT 109*  --   --  129*  --  145*  HEPARINUNFRC  --  <0.10* 0.28*  --  0.41  --   CREATININE 0.61  --   --  0.70  --  0.68    Estimated Creatinine Clearance: 47.6 mL/min (by C-G formula based on SCr of 0.68 mg/dL).   Medical History: Past Medical History:  Diagnosis Date   A-fib ([place])    Asthma    Cerebral vasculitis    Congestive dilated cardiomyopathy (HCC)    COPD (chronic obstructive pulmonary disease) (HCC)    Fibromyalgia    Gastric polyp    GERD (gastroesophageal reflux disease)    Hiatal hernia    Hyperparathyroidism (HCC)    IBS (irritable bowel syndrome)    MVP (mitral valve prolapse)    Osteoarthritis    Ovarian cancer (HCC)    lymph node removal with hysterectomy   Raynaud's disease    RLS (restless legs syndrome)    Situational depression    Sjogren's syndrome (HCC)    Vasculitis (HCC)    Vitamin D deficiency     Medications:  Medications Prior to Admission  Medication Sig Dispense Refill Last Dose   albuterol (VENTOLIN HFA) 108 (90 Base) MCG/ACT inhaler INHALE 2 PUFFS BY MOUTH EVERY 6  HOURS AS NEEDED FOR WHEEZING (Patient taking differently: Inhale 1-2 puffs into the lungs every 6 (six) hours as needed for wheezing or shortness of breath.) 9 g 0 PRN   atenolol (TENORMIN) 25 MG tablet Take 0.5 tablets (12.5 mg total) by mouth 2 (two) times daily. 60 tablet 5 10/17/2020 at 0800   fexofenadine (ALLEGRA) 180 MG tablet Take 180 mg by mouth every morning.    10/17/2020 at 0800   hydrocortisone-pramoxine [facility]) 2.5-1 % rectal cream Place 1 application rectally See admin instructions. After each bathroom use   Past Month   levothyroxine (EUTHYROX) 50 MCG tablet Take 2 tablets (100 mcg total) by mouth every morning. 180 tablet 3 10/17/2020 at 0300   methocarbamol (ROBAXIN) 750 MG tablet Take 750 mg by mouth 4 (four) times daily.   10/17/2020   oxyCODONE-acetaminophen (PERCOCET)  7.5-325 MG tablet Take 1 tablet by mouth every 6 (six) hours as needed for severe pain.   10/16/2020   predniSONE (DELTASONE) 5 MG tablet Take 1 tablet (5 mg total) by mouth every morning. 90 tablet 1 10/17/2020 at 0800   RESTASIS 0.05 % ophthalmic emulsion INSTILL 1 DROP INTO EACH EYE TWICE DAILY (Patient taking differently: Place 1 drop into both eyes 2 (two) times daily.) 120 each 0 10/17/2020   warfarin (COUMADIN) 1 MG tablet TAKE 4MG  MONDAY, WEDNESDAY, AND FRIDAY. TAKE 2 MG TUESDAY, THURSDAY, SATURDAY, AND SUNDAY 90 tablet 3 10/16/2020 at 2100   azithromycin (ZITHROMAX Z-PAK) 250 MG tablet As directed (Patient not taking: Reported on 10/17/2020) 6 tablet 0 Completed Course   Scheduled:   atenolol  12.5 mg Oral BID   cycloSPORINE  1 drop Both Eyes BID   levothyroxine  100 mcg Oral q morning   loratadine  10 mg Oral Daily   methocarbamol  750 mg Oral QID   oxyCODONE-acetaminophen  1 tablet Oral Q6H   pantoprazole (PROTONIX) IV  40 mg Intravenous Q24H   predniSONE  5 mg Oral q morning   Infusions:   sodium chloride     [START ON 10/24/2020] heparin     PRN: acetaminophen **OR** acetaminophen, albuterol, fentaNYL (SUBLIMAZE) injection, ondansetron **OR** ondansetron (ZOFRAN) IV, simethicone Anti-infectives (From admission, onward)    Start     Dose/Rate Route Frequency Ordered Stop   10/17/20 2200  meropenem (MERREM) 1 g in sodium chloride 0.9 % 100 mL IVPB  Status:  Discontinued        1 g 200 mL/hr over 30 Minutes Intravenous Every 12 hours 10/17/20 1544 10/23/20 1106   10/17/20 1315  meropenem (MERREM) 1 g in sodium chloride 0.9 % 100 mL IVPB        1 g 200 mL/hr over 30 Minutes Intravenous  Once 10/17/20 1307 10/17/20 1518       Assessment: Pharmacy has been consulted to restart heparin gtt 10/11 AM and bridge to warfarin INR goal 2-3. Heparin had been held due to surgery but was therapeutic HL 0.41 on heparin iv 1300units/hr. Baseline INR to be collected tomorrow morning.     Goal of Therapy:  INR 2-3 Heparin level 0.3-0.7 units/ml Monitor platelets by anticoagulation protocol: Yes   Plan:  Start heparin infusion at 1300 units/hr tomorrow  Start warfarin 10/11, dose to be determined my following pharmacist based on AM INR  Check anti-Xa level in 8 hours and daily while on heparin Continue to monitor H&H and platelets  Heparin level to be drawn in 8 hours for patients >67 years old or crcl < 30ml/min  Oria Klimas 10/23/2020,11:16 AM

## 2020-10-23 NOTE — Progress Notes (Signed)
PROGRESS NOTE    Angela Wong  VOH:[id number] DOB: January 20, 1940 DOA: 10/17/2020 PCP: [name], FNP   Brief Narrative:   Angela Wong is a 80 y.o. female with medical history significant for atrial fibrillation on warfarin, sick sinus syndrome with pacemaker, hypertension, congestive cardiomyopathy, prior NSTEMI with thrombus in distal PLA secondary to subtherapeutic INR, Sjogren's syndrome on chronic prednisone, hypothyroidism, and COPD who presented to the ED with worsening nausea, vomiting, diarrhea that appears to have began over the last few days.  Patient was admitted with acute cholangitis with choledocholithiasis and has undergone ERCP with stone extraction on 10/6 and is now now status post laparoscopic cholecystectomy on 10/10.  Assessment & Plan:   Active Problems:   Choledocholithiasis   Calculus of gallbladder with acute cholecystitis and obstruction   Pain   Acute choledocholithiasis with cholangitis -Status post ERCP with stone extraction on 10/6 -Now status post laparoscopic cholecystectomy 10/10 -Resume diet per general surgery -Symptomatic management -Trend LFTs with downward trend noted -Merrem has been discontinued -Hold IVF   Transaminitis secondary to above-improved -Now noted to be decreasing -Continue to trend   Spinal stenosis -Ongoing low back pain with oxycodone scheduled -Fentanyl for breakthrough pain -Muscle relaxers now scheduled -PT evaluation as appropriate   Recent dysuria -Continue on Merrem as noted above -UA with no signs of UTI   History of atrial fibrillation -Resume heparin drip on 10/11 AM along with warfarin for bridging -Monitor on telemetry -Continue atenolol -Currently rate controlled   History of COPD -No acute bronchospasms -Breathing treatments as needed   History of hypothyroidism -Continue levothyroxine   History of Sjogren's syndrome -Continue on prednisone and Restasis     DVT prophylaxis:  Heparin drip to be resumed on 10/11 AM Code Status: Full Family Communication: Discussed with son Corene Cornea on phone 10/7 Disposition Plan:  Status is: Inpatient   Remains inpatient appropriate because:Ongoing diagnostic testing needed not appropriate for outpatient work up, IV treatments appropriate due to intensity of illness or inability to take PO, and Inpatient level of care appropriate due to severity of illness   Dispo: The patient is from: Home              Anticipated d/c is to: Home              Patient currently is not medically stable to d/c.              Difficult to place patient No     Skin Assessment:   I have examined the patient's skin and I agree with the wound assessment as performed by the wound care RN as outlined below:   Pressure Injury 06/22/19 Sacrum Left Stage 2 -  Partial thickness loss of dermis presenting as a shallow open injury with a red, pink wound bed without slough. (Active)  06/22/19 2102  Location: Sacrum  Location Orientation: Left  Staging: Stage 2 -  Partial thickness loss of dermis presenting as a shallow open injury with a red, pink wound bed without slough.  Wound Description (Comments):   Present on Admission: Yes      Consultants:  GI General Surgery   Procedures:  ERCP with stone extraction 10/6 Laparoscopic cholecystectomy 10/10  Antimicrobials:  Anti-infectives (From admission, onward)    Start     Dose/Rate Route Frequency Ordered Stop   10/17/20 2200  meropenem (MERREM) 1 g in sodium chloride 0.9 % 100 mL IVPB  Status:  Discontinued  1 g 200 mL/hr over 30 Minutes Intravenous Every 12 hours 10/17/20 1544 10/23/20 1106   10/17/20 1315  meropenem (MERREM) 1 g in sodium chloride 0.9 % 100 mL IVPB        1 g 200 mL/hr over 30 Minutes Intravenous  Once 10/17/20 1307 10/17/20 1518      Subjective: Patient seen and evaluated today with ongoing complaints of ear pain and mild headaches.  She is anxious for her surgery  today.  Objective: Vitals:   10/23/20 1017 10/23/20 1030 10/23/20 1049 10/23/20 1103  BP: (!) 147/75 (!) 145/75 (!) 150/74 (!) 172/72  Pulse: 60 (!) 49  71  Resp: 13 17  17   Temp:   97.9 F (36.6 C) 97.7 F (36.5 C)  TempSrc:    Oral  SpO2: 94% 94%  93%  Weight:      Height:        Intake/Output Summary (Last 24 hours) at 10/23/2020 1112 Last data filed at 10/23/2020 0941 Gross per 24 hour  Intake 1280 ml  Output 15 ml  Net 1265 ml   Filed Weights   10/17/20 0755 10/18/20 0318  Weight: 49.4 kg 58.3 kg    Examination:  General exam: Appears calm and comfortable  Respiratory system: Clear to auscultation. Respiratory effort normal. Cardiovascular system: S1 & S2 heard, RRR.  Gastrointestinal system: Abdomen is soft Central nervous system: Alert and awake Extremities: No edema Skin: No significant lesions noted Psychiatry: Flat affect.    Data Reviewed: I have personally reviewed following labs and imaging studies  CBC: Recent Labs  Lab 10/17/20 0833 10/18/20 0528 10/19/20 0503 10/20/20 0525 10/21/20 0242 10/22/20 0639 10/23/20 0431  WBC 16.3*   < > 12.7* 10.3 6.6 7.1 6.0  NEUTROABS 15.3*  --   --   --   --   --   --   HGB 14.2   < > 13.4 13.6 12.2 13.2 12.2  HCT 45.2   < > 40.4 41.0 37.9 40.7 37.7  MCV 100.4*   < > 98.5 98.6 98.2 99.0 100.3*  PLT 162   < > 124* 112* 109* 129* 145*   < > = values in this interval not displayed.   Basic Metabolic Panel: Recent Labs  Lab 10/19/20 0503 10/20/20 0525 10/21/20 0242 10/22/20 0639 10/23/20 0431  NA 134* 133* 133* 136 135  K 4.6 4.1 4.0 4.0 4.1  CL 105 104 104 107 104  CO2 24 22 25 25 26   GLUCOSE 93 80 86 81 76  BUN 22 19 13 14 13   CREATININE 0.72 0.66 0.61 0.70 0.68  CALCIUM 8.5* 8.4* 8.1* 8.4* 7.9*  MG 1.9 1.6* 1.9 1.7 1.7   GFR: Estimated Creatinine Clearance: 47.6 mL/min (by C-G formula based on SCr of 0.68 mg/dL). Liver Function Tests: Recent Labs  Lab 10/19/20 0503 10/20/20 0525  10/21/20 0242 10/22/20 0639 10/23/20 0431  AST 93* 44* 28 18 15   ALT 152* 109* 74* 59* 45*  ALKPHOS 242* 284* 224* 213* 174*  BILITOT 2.1* 3.2* 2.1* 1.5* 1.1  PROT 5.0* 5.1* 4.4* 5.1* 4.6*  ALBUMIN 2.8* 2.9* 2.5* 2.8* 2.6*   Recent Labs  Lab 10/17/20 0833 10/20/20 0525  LIPASE 27  --   AMYLASE  --  43   No results for input(s): AMMONIA in the last 168 hours. Coagulation Profile: Recent Labs  Lab 10/17/20 0833 10/18/20 0528 10/19/20 0503 10/20/20 0525  INR 3.3* 3.5* 1.3* 1.2   Cardiac Enzymes: No results for input(s):  CKTOTAL, CKMB, CKMBINDEX, TROPONINI in the last 168 hours. BNP (last 3 results) No results for input(s): PROBNP in the last 8760 hours. HbA1C: No results for input(s): HGBA1C in the last 72 hours. CBG: No results for input(s): GLUCAP in the last 168 hours. Lipid Profile: No results for input(s): CHOL, HDL, LDLCALC, TRIG, CHOLHDL, LDLDIRECT in the last 72 hours. Thyroid Function Tests: No results for input(s): TSH, T4TOTAL, FREET4, T3FREE, THYROIDAB in the last 72 hours. Anemia Panel: No results for input(s): VITAMINB12, FOLATE, FERRITIN, TIBC, IRON, RETICCTPCT in the last 72 hours. Sepsis Labs: Recent Labs  Lab 10/17/20 1317  LATICACIDVEN 1.8    Recent Results (from the past 240 hour(s))  Blood culture (routine x 2)     Status: Abnormal   Collection Time: 10/17/20  1:17 PM   Specimen: BLOOD RIGHT HAND  Result Value Ref Range Status   Specimen Description   Final    BLOOD RIGHT HAND Performed at [facility], [address] [place], [place] [address]    Special Requests   Final    Blood Culture results may not be optimal due to an excessive volume of blood received in culture bottles BOTTLES DRAWN AEROBIC AND ANAEROBIC Performed at [facility], [address] [place], [place] [address]    Culture  Setup Time   Final    GRAM NEGATIVE RODS IN BOTH AEROBIC AND ANAEROBIC BOTTLES Gram Stain Report Called to,Read Back By and Verified With:  MORRIS,B AT 0945 ON 10.6.22 BY RUCINSKI,B Organism ID to follow CRITICAL RESULT CALLED TO, READ BACK BY AND VERIFIED WITHDarnell Level COFFEE PHARMD 1427 10/19/20 A BROWNING Performed at [facility], [address] [place], [place] [address]    Culture ESCHERICHIA COLI (A)  Final   Report Status 10/21/2020 FINAL  Final   Organism ID, Bacteria ESCHERICHIA COLI  Final      Susceptibility   Escherichia coli - MIC*    AMPICILLIN <=2 SENSITIVE Sensitive     CEFAZOLIN <=4 SENSITIVE Sensitive     CEFEPIME <=0.12 SENSITIVE Sensitive     CEFTAZIDIME <=1 SENSITIVE Sensitive     CEFTRIAXONE <=0.25 SENSITIVE Sensitive     CIPROFLOXACIN <=0.25 SENSITIVE Sensitive     GENTAMICIN <=1 SENSITIVE Sensitive     IMIPENEM <=0.25 SENSITIVE Sensitive     TRIMETH/SULFA <=20 SENSITIVE Sensitive     AMPICILLIN/SULBACTAM <=2 SENSITIVE Sensitive     PIP/TAZO <=4 SENSITIVE Sensitive     * ESCHERICHIA COLI  Blood culture (routine x 2)     Status: None   Collection Time: 10/17/20  1:17 PM   Specimen: BLOOD LEFT FOREARM  Result Value Ref Range Status   Specimen Description BLOOD LEFT FOREARM  Final   Special Requests   Final    Blood Culture results may not be optimal due to an excessive volume of blood received in culture bottles BOTTLES DRAWN AEROBIC AND ANAEROBIC   Culture   Final    NO GROWTH 6 DAYS Performed at [facility], [address], [place], [place] [address]    Report Status 10/23/2020 FINAL  Final  Blood Culture ID Panel (Reflexed)     Status: Abnormal   Collection Time: 10/17/20  1:17 PM  Result Value Ref Range Status   Enterococcus faecalis NOT DETECTED NOT DETECTED Final   Enterococcus Faecium NOT DETECTED NOT DETECTED Final   Listeria monocytogenes NOT DETECTED NOT DETECTED Final   Staphylococcus species NOT DETECTED NOT DETECTED Final   Staphylococcus aureus (BCID) NOT DETECTED NOT DETECTED  Final   Staphylococcus epidermidis NOT DETECTED NOT DETECTED Final   Staphylococcus lugdunensis NOT  DETECTED NOT DETECTED Final   Streptococcus species NOT DETECTED NOT DETECTED Final   Streptococcus agalactiae NOT DETECTED NOT DETECTED Final   Streptococcus pneumoniae NOT DETECTED NOT DETECTED Final   Streptococcus pyogenes NOT DETECTED NOT DETECTED Final   A.calcoaceticus-baumannii NOT DETECTED NOT DETECTED Final   Bacteroides fragilis NOT DETECTED NOT DETECTED Final   Enterobacterales DETECTED (A) NOT DETECTED Final    Comment: Enterobacterales represent a large order of gram negative bacteria, not a single organism. CRITICAL RESULT CALLED TO, READ BACK BY AND VERIFIED WITH: G COFFEE PHARMD 1427 10/19/20 A BROWNING    Enterobacter cloacae complex NOT DETECTED NOT DETECTED Final   Escherichia coli DETECTED (A) NOT DETECTED Final    Comment: CRITICAL RESULT CALLED TO, READ BACK BY AND VERIFIED WITH: G COFFEE PHARMD 1427 10/19/20 A BROWNING    Klebsiella aerogenes NOT DETECTED NOT DETECTED Final   Klebsiella oxytoca NOT DETECTED NOT DETECTED Final   Klebsiella pneumoniae NOT DETECTED NOT DETECTED Final   Proteus species NOT DETECTED NOT DETECTED Final   Salmonella species NOT DETECTED NOT DETECTED Final   Serratia marcescens NOT DETECTED NOT DETECTED Final   Haemophilus influenzae NOT DETECTED NOT DETECTED Final   Neisseria meningitidis NOT DETECTED NOT DETECTED Final   Pseudomonas aeruginosa NOT DETECTED NOT DETECTED Final   Stenotrophomonas maltophilia NOT DETECTED NOT DETECTED Final   Candida albicans NOT DETECTED NOT DETECTED Final   Candida auris NOT DETECTED NOT DETECTED Final   Candida glabrata NOT DETECTED NOT DETECTED Final   Candida krusei NOT DETECTED NOT DETECTED Final   Candida parapsilosis NOT DETECTED NOT DETECTED Final   Candida tropicalis NOT DETECTED NOT DETECTED Final   Cryptococcus neoformans/gattii NOT DETECTED NOT DETECTED Final   CTX-M ESBL NOT DETECTED NOT DETECTED Final   Carbapenem resistance IMP NOT DETECTED NOT DETECTED Final   Carbapenem resistance KPC  NOT DETECTED NOT DETECTED Final   Carbapenem resistance NDM NOT DETECTED NOT DETECTED Final   Carbapenem resist OXA 48 LIKE NOT DETECTED NOT DETECTED Final   Carbapenem resistance VIM NOT DETECTED NOT DETECTED Final    Comment: Performed at [facility], [address], [place], [place] [address]  Resp Panel by RT-PCR (Flu A&B, Covid) Nasopharyngeal Swab     Status: None   Collection Time: 10/17/20  1:49 PM   Specimen: Nasopharyngeal Swab; Nasopharyngeal(NP) swabs in vial transport medium  Result Value Ref Range Status   SARS Coronavirus 2 by RT PCR NEGATIVE NEGATIVE Final    Comment: (NOTE) SARS-CoV-2 target nucleic acids are NOT DETECTED.  The SARS-CoV-2 RNA is generally detectable in upper respiratory specimens during the acute phase of infection. The lowest concentration of SARS-CoV-2 viral copies this assay can detect is 138 copies/mL. A negative result does not preclude SARS-Cov-2 infection and should not be used as the sole basis for treatment or other patient management decisions. A negative result may occur with  improper specimen collection/handling, submission of specimen other than nasopharyngeal swab, presence of viral mutation(s) within the areas targeted by this assay, and inadequate number of viral copies(<138 copies/mL). A negative result must be combined with clinical observations, patient history, and epidemiological information. The expected result is Negative.  Fact Sheet for Patients:  EntrepreneurPulse.com.au  Fact Sheet for Healthcare Providers:  IncredibleEmployment.be  This test is no t yet approved or cleared by the [place] FDA and  has been authorized for detection and/or  diagnosis of SARS-CoV-2 by FDA under an Emergency Use Authorization (EUA). This EUA will remain  in effect (meaning this test can be used) for the duration of the COVID-19 declaration under Section 564(b)(1) of the Act, 21 U.S.C.section  360bbb-3(b)(1), unless the authorization is terminated  or revoked sooner.       Influenza A by PCR NEGATIVE NEGATIVE Final   Influenza B by PCR NEGATIVE NEGATIVE Final    Comment: (NOTE) The Xpert Xpress SARS-CoV-2/FLU/RSV plus assay is intended as an aid in the diagnosis of influenza from Nasopharyngeal swab specimens and should not be used as a sole basis for treatment. Nasal washings and aspirates are unacceptable for Xpert Xpress SARS-CoV-2/FLU/RSV testing.  Fact Sheet for Patients: EntrepreneurPulse.com.au  Fact Sheet for Healthcare Providers: IncredibleEmployment.be  This test is not yet approved or cleared by the [place] FDA and has been authorized for detection and/or diagnosis of SARS-CoV-2 by FDA under an Emergency Use Authorization (EUA). This EUA will remain in effect (meaning this test can be used) for the duration of the COVID-19 declaration under Section 564(b)(1) of the Act, 21 U.S.C. section 360bbb-3(b)(1), unless the authorization is terminated or revoked.  Performed at [name], [address], [place], [place] [address]   Surgical pcr screen     Status: None   Collection Time: 10/21/20 12:06 AM   Specimen: Nasal Mucosa; Nasal Swab  Result Value Ref Range Status   MRSA, PCR NEGATIVE NEGATIVE Final   Staphylococcus aureus NEGATIVE NEGATIVE Final    Comment: (NOTE) The Xpert SA Assay (FDA approved for NASAL specimens in patients 70 years of age and older), is one component of a comprehensive surveillance program. It is not intended to diagnose infection nor to guide or monitor treatment. Performed at [facility], [address] [place], [place] [address]          Radiology Studies: No results found.      Scheduled Meds:  atenolol  12.5 mg Oral BID   cycloSPORINE  1 drop Both Eyes BID   levothyroxine  100 mcg Oral q morning   loratadine  10 mg Oral Daily   methocarbamol  750 mg Oral QID    oxyCODONE-acetaminophen  1 tablet Oral Q6H   pantoprazole (PROTONIX) IV  40 mg Intravenous Q24H   predniSONE  5 mg Oral q morning   Continuous Infusions:  sodium chloride       LOS: 6 days    Time spent: 35 minutes    Harun Brumley Darleen Crocker, DO [facility]  If 7PM-7AM, please contact night-coverage [email or web address] 10/23/2020, 11:12 AM

## 2020-10-23 NOTE — Op Note (Signed)
Patient:  Angela Wong  DOB:  August 29, 1940  MRN:  [id number]   Preop Diagnosis: Cholelithiasis, history of choledocholithiasis  Postop Diagnosis: Same  Procedure: Laparoscopic cholecystectomy  Surgeon: [name] Signs, MD  Anes: General endotracheal  Indications: Patient is an 80 year old white female status post ERCP with removal of common bile duct stones who now presents for laparoscopic cholecystectomy due to cholelithiasis.  The risks and benefits of the procedure including bleeding, infection, hepatobiliary injury, and the possibility of an open procedure were fully explained to the patient, who gave informed consent.  Procedure note: The patient was placed in the supine position.  After induction of general endotracheal anesthesia, the abdomen was prepped and draped using the usual sterile technique with ChloraPrep.  Surgical site confirmation was performed.  A supraumbilical incision was made down to the fascia.  A Veress needle was introduced into the abdominal cavity and confirmation of placement was done using the saline drop test.  The abdomen was then insufflated to 15 mmHg pressure.  An 11 mm trocar was introduced into the abdominal cavity under direct visualization without difficulty.  The patient was placed in reverse Trendelenburg position and an additional 11 mm trocar was placed in the epigastric region and 5 mm trochars were placed in the right upper quadrant and right flank regions.  Liver was inspected noted to be within normal limits.  The gallbladder was retracted in a dynamic fashion in order to provide a critical view of the triangle of Calot.  The cystic duct was first identified.  Its junction to the infundibulum was fully identified.  Endoclips were placed proximally and distally on the cystic duct, and the cystic duct was divided.  This was likewise done on the cystic artery.  The gallbladder was freed away from the gallbladder fossa using Bovie electrocautery.  The  gallbladder was delivered through the epigastric trocar site using an Endo Catch bag.  The gallbladder fossa was inspected and a bleeding was controlled using Bovie electrocautery.  Arista and Surgicel were placed in the gallbladder fossa.  All fluid and air were then evacuated from the abdominal cavity prior to the removal of the trochars.  All wounds were irrigated normal saline.  All wounds were injected with Exparel.  The epigastric fascia as well as the supraumbilical fascia was reapproximated using 0 Vicryl interrupted sutures.  All skin incisions were closed using a 4-0 Monocryl subcuticular suture.  Dermabond was applied.  All tape and needle counts were correct at the end of the procedure.  The patient was extubated in the operating room and transferred to PACU in stable condition.  Complications: None  EBL: Minimal  Specimen: Gallbladder

## 2020-10-23 NOTE — Interval H&P Note (Signed)
History and Physical Interval Note:  10/23/2020 8:18 AM  Angela Wong  has presented today for surgery, with the diagnosis of cholangitis, choledocholithiasis, esophageal dysphagia.  The various methods of treatment have been discussed with the patient and family. After consideration of risks, benefits and other options for treatment, the patient has consented to  Procedure(s): ENDOSCOPIC RETROGRADE CHOLANGIOPANCREATOGRAPHY (ERCP) WITH PROPOFOL (N/A) ESOPHAGOGASTRODUODENOSCOPY (EGD) WITH PROPOFOL (N/A) ESOPHAGEAL DILATION (N/A) SPHINCTEROTOMY (N/A) STONE EXTRACTION WITH BASKET (N/A) as a surgical intervention.  The patient's history has been reviewed, patient examined, no change in status, stable for surgery.  I have reviewed the patient's chart and labs.  Questions were answered to the patient's satisfaction.     [name] Signs

## 2020-10-23 NOTE — Anesthesia Postprocedure Evaluation (Signed)
Anesthesia Post Note  Patient: Angela Wong  Procedure(s) Performed: LAPAROSCOPIC CHOLECYSTECTOMY (Abdomen)  Patient location during evaluation: PACU Anesthesia Type: General Level of consciousness: awake and alert and oriented Pain management: pain level controlled Vital Signs Assessment: post-procedure vital signs reviewed and stable Respiratory status: spontaneous breathing and respiratory function stable Cardiovascular status: blood pressure returned to baseline and stable Postop Assessment: no apparent nausea or vomiting Anesthetic complications: no   No notable events documented.   Last Vitals:  Vitals:   10/23/20 1030 10/23/20 1049  BP: (!) 145/75 (!) 150/74  Pulse: (!) 49   Resp: 17   Temp:  36.6 C  SpO2: 94%     Last Pain:  Vitals:   10/23/20 1036  TempSrc:   PainSc: Asleep                 Shyra Emile C Jhostin Epps

## 2020-10-23 NOTE — Progress Notes (Signed)
Patient c/o edema and ringing in ears.

## 2020-10-23 NOTE — Transfer of Care (Signed)
Immediate Anesthesia Transfer of Care Note  Patient: Angela Wong  Procedure(s) Performed: LAPAROSCOPIC CHOLECYSTECTOMY (Abdomen)  Patient Location: PACU  Anesthesia Type:General  Level of Consciousness: drowsy  Airway & Oxygen Therapy: Patient Spontanous Breathing and Patient connected to nasal cannula oxygen  Post-op Assessment: Report given to RN and Post -op Vital signs reviewed and stable  Post vital signs: Reviewed and stable  Last Vitals:  Vitals Value Taken Time  BP 148/84 10/23/20 0956  Temp    Pulse 69 10/23/20 0958  Resp 15 10/23/20 0958  SpO2 94 % 10/23/20 0958  Vitals shown include unvalidated device data.  Last Pain:  Vitals:   10/23/20 0846  TempSrc: Oral  PainSc: 8       Patients Stated Pain Goal: 5 (12/45/80 9983)  Complications: No notable events documented.

## 2020-10-23 NOTE — Anesthesia Preprocedure Evaluation (Signed)
Anesthesia Evaluation  Patient identified by MRN, date of birth, ID band Patient awake    Reviewed: Allergy & Precautions, NPO status , Patient's Chart, lab work & pertinent test results, reviewed documented beta blocker date and time   Airway Mallampati: III  TM Distance: >3 FB Neck ROM: Limited  Mouth opening: Limited Mouth Opening  Dental  (+) Missing, Chipped, Loose, Dental Advisory Given   Pulmonary shortness of breath and with exertion, asthma , COPD,     + wheezing      Cardiovascular Exercise Tolerance: Poor hypertension, Pt. on medications and Pt. on home beta blockers + CAD and + Past MI  + dysrhythmias Atrial Fibrillation + pacemaker  Rhythm:Irregular Rate:Normal  1. Left ventricular ejection fraction, by estimation, is 50%. The left ventricle has low normal function. The left ventricle demonstrates global hypokinesis. There is mild left ventricular hypertrophy. Left ventricular  diastolic parameters are indeterminate.  2. Right ventricular systolic function is normal. The right ventricular size is normal. There is normal pulmonary artery systolic pressure. The estimated right ventricular systolic pressure is 35.6 mmHg.  3. Left atrial size was moderately dilated.  4. The mitral valve is grossly normal with mild annular calcification.  Mild mitral valve regurgitation.  5. The aortic valve is tricuspid. Aortic valve regurgitation is not visualized. Mild aortic valve sclerosis is present, with no evidence of aortic valve stenosis.  6. The inferior vena cava is normal in size with greater than 50% respiratory variability, suggesting right atrial pressure of 3 mmHg.    Neuro/Psych PSYCHIATRIC DISORDERS Depression  Neuromuscular disease    GI/Hepatic hiatal hernia, GERD  Medicated and Poorly Controlled,Cholecystitis    Endo/Other  Hypothyroidism   Renal/GU      Musculoskeletal  (+) Arthritis , Osteoarthritis and  Rheumatoid disorders,  Fibromyalgia -, narcotic dependent  Abdominal   Peds  Hematology  (+) anemia ,   Anesthesia Other Findings   Reproductive/Obstetrics                             Anesthesia Physical Anesthesia Plan  ASA: 3  Anesthesia Plan: General   Post-op Pain Management:    Induction: Intravenous  PONV Risk Score and Plan: 4 or greater and Ondansetron and Dexamethasone  Airway Management Planned: Oral ETT and Video Laryngoscope Planned  Additional Equipment:   Intra-op Plan:   Post-operative Plan: Extubation in OR  Informed Consent: I have reviewed the patients History and Physical, chart, labs and discussed the procedure including the risks, benefits and alternatives for the proposed anesthesia with the patient or authorized representative who has indicated his/her understanding and acceptance.     Dental advisory given  Plan Discussed with: CRNA and Surgeon  Anesthesia Plan Comments:         Anesthesia Quick Evaluation

## 2020-10-23 NOTE — Anesthesia Procedure Notes (Signed)
Procedure Name: Intubation Date/Time: 10/23/2020 9:07 AM Performed by: Karna Dupes, CRNA Pre-anesthesia Checklist: Patient identified, Emergency Drugs available, Suction available and Patient being monitored Patient Re-evaluated:Patient Re-evaluated prior to induction Oxygen Delivery Method: Circle system utilized Preoxygenation: Pre-oxygenation with 100% oxygen Induction Type: IV induction Ventilation: Mask ventilation without difficulty Laryngoscope Size: Glidescope and 3 Grade View: Grade I Tube type: Oral Tube size: 7.0 mm Number of attempts: 1 Airway Equipment and Method: Stylet Placement Confirmation: ETT inserted through vocal cords under direct vision, positive ETCO2 and breath sounds checked- equal and bilateral Secured at: 21 cm Tube secured with: Tape Dental Injury: Teeth and Oropharynx as per pre-operative assessment  Comments: Head and neck maintained in pts normal flexed position

## 2020-10-24 ENCOUNTER — Encounter (HOSPITAL_COMMUNITY): Payer: Self-pay | Admitting: General Surgery

## 2020-10-24 DIAGNOSIS — K8001 Calculus of gallbladder with acute cholecystitis with obstruction: Secondary | ICD-10-CM | POA: Diagnosis not present

## 2020-10-24 LAB — COMPREHENSIVE METABOLIC PANEL
ALT: 37 U/L (ref 0–44)
AST: 21 U/L (ref 15–41)
Albumin: 2.5 g/dL — ABNORMAL LOW (ref 3.5–5.0)
Alkaline Phosphatase: 169 U/L — ABNORMAL HIGH (ref 38–126)
Anion gap: 5 (ref 5–15)
BUN: 12 mg/dL (ref 8–23)
CO2: 27 mmol/L (ref 22–32)
Calcium: 8 mg/dL — ABNORMAL LOW (ref 8.9–10.3)
Chloride: 104 mmol/L (ref 98–111)
Creatinine, Ser: 0.65 mg/dL (ref 0.44–1.00)
GFR, Estimated: >60 mL/min (ref >60)
Glucose, Bld: 123 mg/dL — ABNORMAL HIGH (ref 70–99)
Potassium: 4.1 mmol/L (ref 3.5–5.1)
Sodium: 136 mmol/L (ref 135–145)
Total Bilirubin: 0.8 mg/dL (ref 0.3–1.2)
Total Protein: 4.5 g/dL — ABNORMAL LOW (ref 6.5–8.1)

## 2020-10-24 LAB — CBC
HCT: 37.5 % (ref 36.0–46.0)
Hemoglobin: 12.2 g/dL (ref 12.0–15.0)
MCH: 32.4 pg (ref 26.0–34.0)
MCHC: 32.5 g/dL (ref 30.0–36.0)
MCV: 99.7 fL (ref 80.0–100.0)
Platelets: 148 10*3/uL — ABNORMAL LOW (ref 150–400)
RBC: 3.76 MIL/uL — ABNORMAL LOW (ref 3.87–5.11)
RDW: 15.3 % (ref 11.5–15.5)
WBC: 10.8 10*3/uL — ABNORMAL HIGH (ref 4.0–10.5)
nRBC: 0 % (ref 0.0–0.2)

## 2020-10-24 LAB — PROTIME-INR
INR: 1.1 (ref 0.8–1.2)
Prothrombin Time: 14.4 seconds (ref 11.4–15.2)

## 2020-10-24 LAB — SURGICAL PATHOLOGY

## 2020-10-24 LAB — MAGNESIUM: Magnesium: 1.6 mg/dL — ABNORMAL LOW (ref 1.7–2.4)

## 2020-10-24 MED ORDER — MAGNESIUM SULFATE 2 GM/50ML IV SOLN
2.0000 g | Freq: Once | INTRAVENOUS | Status: AC
  Administered 2020-10-24: 2 g via INTRAVENOUS
  Filled 2020-10-24: qty 50

## 2020-10-24 MED ORDER — ENOXAPARIN SODIUM 60 MG/0.6ML IJ SOSY
60.0000 mg | PREFILLED_SYRINGE | Freq: Two times a day (BID) | INTRAMUSCULAR | Status: DC
  Administered 2020-10-24 – 2020-10-27 (×8): 60 mg via SUBCUTANEOUS
  Filled 2020-10-24 (×8): qty 0.6

## 2020-10-24 MED ORDER — FUROSEMIDE 10 MG/ML IJ SOLN
40.0000 mg | Freq: Every day | INTRAMUSCULAR | Status: DC
  Administered 2020-10-24 – 2020-10-28 (×5): 40 mg via INTRAVENOUS
  Filled 2020-10-24 (×5): qty 4

## 2020-10-24 MED ORDER — WARFARIN SODIUM 5 MG PO TABS
5.0000 mg | ORAL_TABLET | Freq: Once | ORAL | Status: AC
  Administered 2020-10-24: 5 mg via ORAL
  Filled 2020-10-24: qty 1

## 2020-10-24 MED ORDER — WARFARIN - PHARMACIST DOSING INPATIENT: Freq: Every day | Status: DC

## 2020-10-24 NOTE — Progress Notes (Signed)
Nurse tech in pts room performing personal care, this nurse was called into room to assist with turning and personal care. pt then had complaints of pain in thighs. Upon assessment +4 pitting edema found in both thighs. When pt is laying supine abd feels taunt. 4 port site intact and no drainage noted. Pt has refused to get up post surgery/dangle on side of bed. MD notified of all findings and will be coming bedside.

## 2020-10-24 NOTE — Progress Notes (Signed)
RN called me and stated that patient was wheezing mainly on left side.  Went in to assess patient and did not hear any wheezing.  Patient is currently getting lasix and stated that she felt SOB and was wheezing.  Still administered PRN Albuterol (2p).

## 2020-10-24 NOTE — Progress Notes (Signed)
Pt c/o SOB and wheezing after personal care was completed. Lung sounds ausculted and wheezing heard in left upper lobe. Respiratory called and will be coming with PRN inhaler. Pt is aware and oncoming nurse is aware.

## 2020-10-24 NOTE — Evaluation (Signed)
Physical Therapy Evaluation Patient Details Name: Angela Wong MRN: [id number] DOB: Nov 16, 1940 Today's Date: 10/24/2020  History of Present Illness  Angela Wong is a 80 y.o. female who was admitted with acute cholangitis with choledocholithiasis and has undergone ERCP with stone extraction on 10/6, and now s/p lap chole 10/10. PMH: afib, sick sinus syndrome with pacemaker, HTN, cogestive cardiomyopathy, prior MSTEMI, Sjogren's syndrome, COPD   Clinical Impression  Pt admitted with above diagnosis. Pt reports has caregiver 3-4days/week for 24 hrs then is on her own the other days, sleeps in lift chair, limited ambulation to restroom and back, caregiver and granddaughter assisting with sponge baths or transfer into walk in shower. Pt currently limited by pain, able to perform ankle pumps and SAQ, but trace SLR and hip abd/add without movement. Pt becomes tearful while attempting to mobilize to EOB and requests to stop and for pain medication- RN notified. Heavy education regarding therapeutic process, mobilizing and benefit of therapy. Recommend SNF at this time due to assistance level required and no family present to assist 24/7; pt was able to stay home alone for a few days at a time prior to admission. Pt currently with functional limitations due to the deficits listed below (see PT Problem List). Pt will benefit from skilled PT to increase their independence and safety with mobility to allow discharge to the venue listed below.          Recommendations for follow up therapy are one component of a multi-disciplinary discharge planning process, led by the attending physician.  Recommendations may be updated based on patient status, additional functional criteria and insurance authorization.  Follow Up Recommendations SNF    Equipment Recommendations  None recommended by PT (defer to next venue)    Recommendations for Other Services       Precautions / Restrictions  Precautions Precautions: Fall Precaution Comments: high pain Restrictions Weight Bearing Restrictions: No      Mobility  Bed Mobility  General bed mobility comments: attempted to mobilize LEs to EOB but too painful, pt began crying and requests to stop and for pain medication- RN notified    Transfers     Ambulation/Gait     Stairs            Wheelchair Mobility    Modified Rankin (Stroke Patients Only)       Balance        Pertinent Vitals/Pain Pain Assessment: Faces Faces Pain Scale: Hurts worst Pain Location: generalized with mobility Pain Descriptors / Indicators: Discomfort Pain Intervention(s): Limited activity within patient's tolerance;Monitored during session;Repositioned;Patient requesting pain meds-RN notified    Home Living Family/patient expects to be discharged to:: Private residence Living Arrangements: Alone Available Help at Discharge: Personal care attendant;Available PRN/intermittently (3-4 days/week for 24 hr increments, granddaughter occasionally assists) Type of Home: House Home Access: Ramped entrance     Home Layout: One level Home Equipment: Walker - 2 wheels;Walker - 4 wheels;Shower seat;Wheelchair - manual      Prior Function Level of Independence: Independent with assistive device(s);Needs assistance   Gait / Transfers Assistance Needed: pt using RW for in room ambulation, rollator to go room to room with seated rest breaks as needed; has w/c with bent tire that makes ramp navigation difficult  ADL's / Homemaking Assistance Needed: aide reports assisting pt with sponge baths, +2 for transfers to walk in shower  Comments: Pt reports sleeping in lift chair, ambulates to restroom then back to lift chair.     Hand Dominance  Dominant Hand: Left    Extremity/Trunk Assessment   Upper Extremity Assessment Upper Extremity Assessment: Difficult to assess due to impaired cognition    Lower Extremity Assessment Lower  Extremity Assessment: Generalized weakness;RLE deficits/detail;LLE deficits/detail RLE Deficits / Details: ankle AROM WNL, quad set and SAQ with good activation, trace SLR and hip abd/add RLE Sensation: decreased light touch LLE Deficits / Details: ankle AROM WNL, quad set with good activation, and SAQ ~50%, trace SLR and hip abd/add LLE Sensation: decreased light touch       Communication   Communication: No difficulties  Cognition Arousal/Alertness: Awake/alert Behavior During Therapy: WFL for tasks assessed/performed Overall Cognitive Status: Within Functional Limits for tasks assessed     General Comments      Exercises     Assessment/Plan    PT Assessment Patient needs continued PT services  PT Problem List Decreased strength;Decreased range of motion;Decreased activity tolerance;Decreased balance;Decreased mobility;Decreased knowledge of use of DME;Decreased safety awareness;Impaired sensation;Pain       PT Treatment Interventions DME instruction;Gait training;Functional mobility training;Therapeutic activities;Therapeutic exercise;Balance training;Patient/family education    PT Goals (Current goals can be found in the Care Plan section)  Acute Rehab PT Goals Patient Stated Goal: less pain PT Goal Formulation: With patient Time For Goal Achievement: 11/07/20 Potential to Achieve Goals: Fair    Frequency Min 3X/week   Barriers to discharge        Co-evaluation               AM-PAC PT "6 Clicks" Mobility  Outcome Measure Help needed turning from your back to your side while in a flat bed without using bedrails?: Total Help needed moving from lying on your back to sitting on the side of a flat bed without using bedrails?: Total Help needed moving to and from a bed to a chair (including a wheelchair)?: Total Help needed standing up from a chair using your arms (e.g., wheelchair or bedside chair)?: Total Help needed to walk in hospital room?: Total Help  needed climbing 3-5 steps with a railing? : Total 6 Click Score: 6    End of Session   Activity Tolerance: Patient limited by pain Patient left: in bed;with call bell/phone within reach;with bed alarm set;with family/visitor present Nurse Communication: Mobility status;Patient requests pain meds PT Visit Diagnosis: Other abnormalities of gait and mobility (R26.89);Muscle weakness (generalized) (M62.81);Pain Pain - Right/Left:  (generalized)    Time: 2505-3976 PT Time Calculation (min) (ACUTE ONLY): 28 min   Charges:   PT Evaluation $PT Eval Low Complexity: 1 Low PT Treatments $Self Care/Home Management: 8-22        Tori Derhonda Eastlick PT, DPT 10/24/20, 11:33 AM

## 2020-10-24 NOTE — Progress Notes (Addendum)
PROGRESS NOTE    Angela Wong  OIN:[id number] DOB: October 29, 1940 DOA: 10/17/2020 PCP: [name], FNP   Brief Narrative:   Angela Wong is a 80 y.o. female with medical history significant for atrial fibrillation on warfarin, sick sinus syndrome with pacemaker, hypertension, congestive cardiomyopathy, prior NSTEMI with thrombus in distal PLA secondary to subtherapeutic INR, Sjogren's syndrome on chronic prednisone, hypothyroidism, and COPD who presented to the ED with worsening nausea, vomiting, diarrhea that appears to have began over the last few days.  Patient was admitted with acute cholangitis with choledocholithiasis and has undergone ERCP with stone extraction on 10/6 and is now now status post laparoscopic cholecystectomy on 10/10.  She is now on bridging therapy with full dose Lovenox as she becomes therapeutic in her Coumadin levels.  She will need INR greater than 2 on discharge since she has had prior NSTEMI due to thrombus with subtherapeutic INR.  Assessment & Plan:   Active Problems:   Choledocholithiasis   Calculus of gallbladder with acute cholecystitis and obstruction   Pain   Acute choledocholithiasis with cholangitis with associated E Coli bacteremia -Status post ERCP with stone extraction on 10/6 -Now status post laparoscopic cholecystectomy 10/10 -Resumed diet per general surgery -Symptomatic management -Merrem has been discontinued with source control -Hold IVF   Transaminitis secondary to above-improved -Now noted to be decreasing -Continue to trend   Spinal stenosis -Ongoing low back pain with oxycodone scheduled -Fentanyl for breakthrough pain -Muscle relaxers now scheduled -PT evaluation recommending SNF, but she wants to go home -Order for hospital bed placed  Hypomagnesemia -Replete and reevaluate in a.m.   Recent dysuria -Continue on Merrem as noted above -UA with no signs of UTI   History of atrial fibrillation -Resumed Lovenox on  10/11 AM along with warfarin for bridging -Monitor on telemetry -Continue atenolol -Currently rate controlled   History of COPD -No acute bronchospasms -Breathing treatments as needed   History of hypothyroidism -Continue levothyroxine   History of Sjogren's syndrome -Continue on prednisone and Restasis     DVT prophylaxis: Full dose Lovenox for bridging while on warfarin started 10/11 Code Status: Full Family Communication: Discussed with son Corene Cornea on phone 10/11 Disposition Plan:  Status is: Inpatient   Remains inpatient appropriate because:Ongoing diagnostic testing needed not appropriate for outpatient work up, IV treatments appropriate due to intensity of illness or inability to take PO, and Inpatient level of care appropriate due to severity of illness   Dispo: The patient is from: Home              Anticipated d/c is to: Home              Patient currently is not medically stable to d/c.              Difficult to place patient No     Skin Assessment:   I have examined the patient's skin and I agree with the wound assessment as performed by the wound care RN as outlined below:   Pressure Injury 06/22/19 Sacrum Left Stage 2 -  Partial thickness loss of dermis presenting as a shallow open injury with a red, pink wound bed without slough. (Active)  06/22/19 2102  Location: Sacrum  Location Orientation: Left  Staging: Stage 2 -  Partial thickness loss of dermis presenting as a shallow open injury with a red, pink wound bed without slough.  Wound Description (Comments):   Present on Admission: Yes      Consultants:  GI General Surgery   Procedures:  ERCP with stone extraction 10/6 Laparoscopic cholecystectomy 10/10  Antimicrobials:  Anti-infectives (From admission, onward)    Start     Dose/Rate Route Frequency Ordered Stop   10/23/20 1400  meropenem (MERREM) 1 g in sodium chloride 0.9 % 100 mL IVPB        1 g 200 mL/hr over 30 Minutes Intravenous Every 12  hours 10/23/20 1310 10/23/20 2148   10/17/20 2200  meropenem (MERREM) 1 g in sodium chloride 0.9 % 100 mL IVPB  Status:  Discontinued        1 g 200 mL/hr over 30 Minutes Intravenous Every 12 hours 10/17/20 1544 10/23/20 1106   10/17/20 1315  meropenem (MERREM) 1 g in sodium chloride 0.9 % 100 mL IVPB        1 g 200 mL/hr over 30 Minutes Intravenous  Once 10/17/20 1307 10/17/20 1518       Subjective: Patient seen and evaluated today with various complaints of chronic concerns.  She denies any significant abdominal pain and appears to be tolerating diet.  Objective: Vitals:   10/23/20 1800 10/23/20 2119 10/24/20 0515 10/24/20 1000  BP: 131/80 138/79 116/66   Pulse: 69 65 60   Resp: 18 18 16    Temp: 98.2 F (36.8 C) 98 F (36.7 C) 98 F (36.7 C)   TempSrc: Oral Oral Oral   SpO2: 95% 94% 93% 94%  Weight:      Height:        Intake/Output Summary (Last 24 hours) at 10/24/2020 1139 Last data filed at 10/24/2020 0539 Gross per 24 hour  Intake 1713.28 ml  Output 1000 ml  Net 713.28 ml   Filed Weights   10/17/20 0755 10/18/20 0318  Weight: 49.4 kg 58.3 kg    Examination:  General exam: Appears calm and comfortable  Respiratory system: Clear to auscultation. Respiratory effort normal. Cardiovascular system: S1 & S2 heard, RRR.  Gastrointestinal system: Abdomen is soft, incisions clean dry and intact. Central nervous system: Alert and awake Extremities: No edema Skin: No significant lesions noted Psychiatry: Flat affect.    Data Reviewed: I have personally reviewed following labs and imaging studies  CBC: Recent Labs  Lab 10/20/20 0525 10/21/20 0242 10/22/20 0639 10/23/20 0431 10/24/20 0512  WBC 10.3 6.6 7.1 6.0 10.8*  HGB 13.6 12.2 13.2 12.2 12.2  HCT 41.0 37.9 40.7 37.7 37.5  MCV 98.6 98.2 99.0 100.3* 99.7  PLT 112* 109* 129* 145* 976*   Basic Metabolic Panel: Recent Labs  Lab 10/20/20 0525 10/21/20 0242 10/22/20 0639 10/23/20 0431 10/24/20 0512   NA 133* 133* 136 135 136  K 4.1 4.0 4.0 4.1 4.1  CL 104 104 107 104 104  CO2 22 25 25 26 27   GLUCOSE 80 86 81 76 123*  BUN 19 13 14 13 12   CREATININE 0.66 0.61 0.70 0.68 0.65  CALCIUM 8.4* 8.1* 8.4* 7.9* 8.0*  MG 1.6* 1.9 1.7 1.7 1.6*   GFR: Estimated Creatinine Clearance: 47.6 mL/min (by C-G formula based on SCr of 0.65 mg/dL). Liver Function Tests: Recent Labs  Lab 10/20/20 0525 10/21/20 0242 10/22/20 0639 10/23/20 0431 10/24/20 0512  AST 44* 28 18 15 21   ALT 109* 74* 59* 45* 37  ALKPHOS 284* 224* 213* 174* 169*  BILITOT 3.2* 2.1* 1.5* 1.1 0.8  PROT 5.1* 4.4* 5.1* 4.6* 4.5*  ALBUMIN 2.9* 2.5* 2.8* 2.6* 2.5*   Recent Labs  Lab 10/20/20 0525  AMYLASE 43   No results  for input(s): AMMONIA in the last 168 hours. Coagulation Profile: Recent Labs  Lab 10/18/20 0528 10/19/20 0503 10/20/20 0525 10/24/20 0512  INR 3.5* 1.3* 1.2 1.1   Cardiac Enzymes: No results for input(s): CKTOTAL, CKMB, CKMBINDEX, TROPONINI in the last 168 hours. BNP (last 3 results) No results for input(s): PROBNP in the last 8760 hours. HbA1C: No results for input(s): HGBA1C in the last 72 hours. CBG: No results for input(s): GLUCAP in the last 168 hours. Lipid Profile: No results for input(s): CHOL, HDL, LDLCALC, TRIG, CHOLHDL, LDLDIRECT in the last 72 hours. Thyroid Function Tests: No results for input(s): TSH, T4TOTAL, FREET4, T3FREE, THYROIDAB in the last 72 hours. Anemia Panel: No results for input(s): VITAMINB12, FOLATE, FERRITIN, TIBC, IRON, RETICCTPCT in the last 72 hours. Sepsis Labs: Recent Labs  Lab 10/17/20 1317  LATICACIDVEN 1.8    Recent Results (from the past 240 hour(s))  Blood culture (routine x 2)     Status: Abnormal   Collection Time: 10/17/20  1:17 PM   Specimen: BLOOD RIGHT HAND  Result Value Ref Range Status   Specimen Description   Final    BLOOD RIGHT HAND Performed at [facility], [address] [place], [place] [address]    Special Requests   Final     Blood Culture results may not be optimal due to an excessive volume of blood received in culture bottles BOTTLES DRAWN AEROBIC AND ANAEROBIC Performed at [facility], [address], [place], [place] [address]    Culture  Setup Time   Final    GRAM NEGATIVE RODS IN BOTH AEROBIC AND ANAEROBIC BOTTLES Gram Stain Report Called to,Read Back By and Verified With: MORRIS,B AT 0945 ON 10.6.22 BY RUCINSKI,B Organism ID to follow CRITICAL RESULT CALLED TO, READ BACK BY AND VERIFIED WITHDarnell Level COFFEE PHARMD 1427 10/19/20 A BROWNING Performed at [facility], [address], [place], [place] [address]    Culture ESCHERICHIA COLI (A)  Final   Report Status 10/21/2020 FINAL  Final   Organism ID, Bacteria ESCHERICHIA COLI  Final      Susceptibility   Escherichia coli - MIC*    AMPICILLIN <=2 SENSITIVE Sensitive     CEFAZOLIN <=4 SENSITIVE Sensitive     CEFEPIME <=0.12 SENSITIVE Sensitive     CEFTAZIDIME <=1 SENSITIVE Sensitive     CEFTRIAXONE <=0.25 SENSITIVE Sensitive     CIPROFLOXACIN <=0.25 SENSITIVE Sensitive     GENTAMICIN <=1 SENSITIVE Sensitive     IMIPENEM <=0.25 SENSITIVE Sensitive     TRIMETH/SULFA <=20 SENSITIVE Sensitive     AMPICILLIN/SULBACTAM <=2 SENSITIVE Sensitive     PIP/TAZO <=4 SENSITIVE Sensitive     * ESCHERICHIA COLI  Blood culture (routine x 2)     Status: None   Collection Time: 10/17/20  1:17 PM   Specimen: BLOOD LEFT FOREARM  Result Value Ref Range Status   Specimen Description BLOOD LEFT FOREARM  Final   Special Requests   Final    Blood Culture results may not be optimal due to an excessive volume of blood received in culture bottles BOTTLES DRAWN AEROBIC AND ANAEROBIC   Culture   Final    NO GROWTH 6 DAYS Performed at [facility], [address], [place], [place] [address]    Report Status 10/23/2020 FINAL  Final  Blood Culture ID Panel (Reflexed)     Status: Abnormal   Collection Time: 10/17/20  1:17 PM  Result Value Ref Range Status   Enterococcus faecalis NOT  DETECTED NOT DETECTED  Final   Enterococcus Faecium NOT DETECTED NOT DETECTED Final   Listeria monocytogenes NOT DETECTED NOT DETECTED Final   Staphylococcus species NOT DETECTED NOT DETECTED Final   Staphylococcus aureus (BCID) NOT DETECTED NOT DETECTED Final   Staphylococcus epidermidis NOT DETECTED NOT DETECTED Final   Staphylococcus lugdunensis NOT DETECTED NOT DETECTED Final   Streptococcus species NOT DETECTED NOT DETECTED Final   Streptococcus agalactiae NOT DETECTED NOT DETECTED Final   Streptococcus pneumoniae NOT DETECTED NOT DETECTED Final   Streptococcus pyogenes NOT DETECTED NOT DETECTED Final   A.calcoaceticus-baumannii NOT DETECTED NOT DETECTED Final   Bacteroides fragilis NOT DETECTED NOT DETECTED Final   Enterobacterales DETECTED (A) NOT DETECTED Final    Comment: Enterobacterales represent a large order of gram negative bacteria, not a single organism. CRITICAL RESULT CALLED TO, READ BACK BY AND VERIFIED WITH: G COFFEE PHARMD 1427 10/19/20 A BROWNING    Enterobacter cloacae complex NOT DETECTED NOT DETECTED Final   Escherichia coli DETECTED (A) NOT DETECTED Final    Comment: CRITICAL RESULT CALLED TO, READ BACK BY AND VERIFIED WITH: G COFFEE PHARMD 1427 10/19/20 A BROWNING    Klebsiella aerogenes NOT DETECTED NOT DETECTED Final   Klebsiella oxytoca NOT DETECTED NOT DETECTED Final   Klebsiella pneumoniae NOT DETECTED NOT DETECTED Final   Proteus species NOT DETECTED NOT DETECTED Final   Salmonella species NOT DETECTED NOT DETECTED Final   Serratia marcescens NOT DETECTED NOT DETECTED Final   Haemophilus influenzae NOT DETECTED NOT DETECTED Final   Neisseria meningitidis NOT DETECTED NOT DETECTED Final   Pseudomonas aeruginosa NOT DETECTED NOT DETECTED Final   Stenotrophomonas maltophilia NOT DETECTED NOT DETECTED Final   Candida albicans NOT DETECTED NOT DETECTED Final   Candida auris NOT DETECTED NOT DETECTED Final   Candida glabrata NOT DETECTED NOT DETECTED Final    Candida krusei NOT DETECTED NOT DETECTED Final   Candida parapsilosis NOT DETECTED NOT DETECTED Final   Candida tropicalis NOT DETECTED NOT DETECTED Final   Cryptococcus neoformans/gattii NOT DETECTED NOT DETECTED Final   CTX-M ESBL NOT DETECTED NOT DETECTED Final   Carbapenem resistance IMP NOT DETECTED NOT DETECTED Final   Carbapenem resistance KPC NOT DETECTED NOT DETECTED Final   Carbapenem resistance NDM NOT DETECTED NOT DETECTED Final   Carbapenem resist OXA 48 LIKE NOT DETECTED NOT DETECTED Final   Carbapenem resistance VIM NOT DETECTED NOT DETECTED Final    Comment: Performed at [facility], [address] [place], [place] [address]  Resp Panel by RT-PCR (Flu A&B, Covid) Nasopharyngeal Swab     Status: None   Collection Time: 10/17/20  1:49 PM   Specimen: Nasopharyngeal Swab; Nasopharyngeal(NP) swabs in vial transport medium  Result Value Ref Range Status   SARS Coronavirus 2 by RT PCR NEGATIVE NEGATIVE Final    Comment: (NOTE) SARS-CoV-2 target nucleic acids are NOT DETECTED.  The SARS-CoV-2 RNA is generally detectable in upper respiratory specimens during the acute phase of infection. The lowest concentration of SARS-CoV-2 viral copies this assay can detect is 138 copies/mL. A negative result does not preclude SARS-Cov-2 infection and should not be used as the sole basis for treatment or other patient management decisions. A negative result may occur with  improper specimen collection/handling, submission of specimen other than nasopharyngeal swab, presence of viral mutation(s) within the areas targeted by this assay, and inadequate number of viral copies(<138 copies/mL). A negative result must be combined with clinical observations, patient history, and epidemiological information. The expected result is Negative.  Fact  Sheet for Patients:  EntrepreneurPulse.com.au  Fact Sheet for Healthcare Providers:   IncredibleEmployment.be  This test is no t yet approved or cleared by the [place] FDA and  has been authorized for detection and/or diagnosis of SARS-CoV-2 by FDA under an Emergency Use Authorization (EUA). This EUA will remain  in effect (meaning this test can be used) for the duration of the COVID-19 declaration under Section 564(b)(1) of the Act, 21 U.S.C.section 360bbb-3(b)(1), unless the authorization is terminated  or revoked sooner.       Influenza A by PCR NEGATIVE NEGATIVE Final   Influenza B by PCR NEGATIVE NEGATIVE Final    Comment: (NOTE) The Xpert Xpress SARS-CoV-2/FLU/RSV plus assay is intended as an aid in the diagnosis of influenza from Nasopharyngeal swab specimens and should not be used as a sole basis for treatment. Nasal washings and aspirates are unacceptable for Xpert Xpress SARS-CoV-2/FLU/RSV testing.  Fact Sheet for Patients: EntrepreneurPulse.com.au  Fact Sheet for Healthcare Providers: IncredibleEmployment.be  This test is not yet approved or cleared by the [place] FDA and has been authorized for detection and/or diagnosis of SARS-CoV-2 by FDA under an Emergency Use Authorization (EUA). This EUA will remain in effect (meaning this test can be used) for the duration of the COVID-19 declaration under Section 564(b)(1) of the Act, 21 U.S.C. section 360bbb-3(b)(1), unless the authorization is terminated or revoked.  Performed at [facility], [address] [place], [place] [address]   Surgical pcr screen     Status: None   Collection Time: 10/21/20 12:06 AM   Specimen: Nasal Mucosa; Nasal Swab  Result Value Ref Range Status   MRSA, PCR NEGATIVE NEGATIVE Final   Staphylococcus aureus NEGATIVE NEGATIVE Final    Comment: (NOTE) The Xpert SA Assay (FDA approved for NASAL specimens in patients 74 years of age and older), is one component of a comprehensive surveillance program. It is not  intended to diagnose infection nor to guide or monitor treatment. Performed at [facility], [address] [place], [place] [address]          Radiology Studies: No results found.      Scheduled Meds:  atenolol  12.5 mg Oral BID   cycloSPORINE  1 drop Both Eyes BID   enoxaparin (LOVENOX) injection  60 mg Subcutaneous Q12H   levothyroxine  100 mcg Oral q morning   loratadine  10 mg Oral Daily   methocarbamol  750 mg Oral QID   oxyCODONE-acetaminophen  1 tablet Oral Q6H   pantoprazole (PROTONIX) IV  40 mg Intravenous Q24H   predniSONE  5 mg Oral q morning   warfarin  5 mg Oral ONCE-1600   Warfarin - Pharmacist Dosing Inpatient   Does not apply q1600   Continuous Infusions:  promethazine (PHENERGAN) injection (IM or IVPB) Stopped (10/23/20 1611)     LOS: 7 days    Time spent: 35 minutes    Jasiah Elsen Darleen Crocker, DO [facility]  If 7PM-7AM, please contact night-coverage [email or web address] 10/24/2020, 11:39 AM

## 2020-10-24 NOTE — Progress Notes (Addendum)
ANTICOAGULATION CONSULT NOTE -   Pharmacy Consult for lovenox and warfarin bridge  Indication: atrial fibrillation  Allergies  Allergen Reactions   Cephalosporins Hives and Shortness Of Breath   Ciprofloxacin Hives and Shortness Of Breath   Diltiazem Shortness Of Breath    Swollen throat    Doxycycline Hives and Shortness Of Breath   Horse-Derived Products Anaphylaxis   Ketek [Telithromycin] Palpitations    Chest discomfort   Nitrofuran Derivatives Anaphylaxis and Hives    blisters   Nitrous Oxide Nausea And Vomiting    Severe due to Sjogrens (Auto-Immune Disease)   Other Anaphylaxis    ALLERGY TO HORSE SERUM   Penicillins Hives and Shortness Of Breath        Pentazocine Other (See Comments)    Other reaction(s): Mental Status Changes (intolerance) Altered Mental Status  Altered Mental Status  Altered Mental Status    Sulfa Antibiotics Hives and Shortness Of Breath   Trovan [Alatrofloxacin] Palpitations, Other (See Comments) and Anaphylaxis    Chest pain, dizziness, irregular pulse   Vitamin B12     Confusion and shaking    Zinc Gelatin [Zinc] Anaphylaxis   Amlodipine Swelling   Calcium Channel Blockers     Respiratory distress   Carvedilol Other (See Comments)    Dizziness, "joint pain, depression"   Clindamycin/Lincomycin     CP, lock jaw   Codeine Nausea Only   Cymbalta [Duloxetine Hcl] Swelling   Diovan [Valsartan] Swelling   Lisinopril Swelling   Sertraline Other (See Comments)    confusion   Tramadol Nausea Only   Loteprednol Etabonate Rash    Patient Measurements: Height: 5\' 4"  (162.6 cm) Weight: 58.3 kg (128 lb 8.5 oz) IBW/kg (Calculated) : 54.7 Heparin Dosing Weight: HEPARIN DW (KG): 49.4   Vital Signs: Temp: 98 F (36.7 C) (10/11 0515) Temp Source: Oral (10/11 0515) BP: 116/66 (10/11 0515) Pulse Rate: 60 (10/11 0515)  Labs: Recent Labs    10/21/20 1511 10/22/20 0054 10/22/20 0639 10/22/20 0639 10/22/20 0942 10/23/20 0431  10/24/20 0512  HGB  --   --  13.2   < >  --  12.2 12.2  HCT  --   --  40.7  --   --  37.7 37.5  PLT  --   --  129*  --   --  145* 148*  LABPROT  --   --   --   --   --   --  14.4  INR  --   --   --   --   --   --  1.1  HEPARINUNFRC <0.10* 0.28*  --   --  0.41  --   --   CREATININE  --   --  0.70  --   --  0.68 0.65   < > = values in this interval not displayed.     Estimated Creatinine Clearance: 47.6 mL/min (by C-G formula based on SCr of 0.65 mg/dL).   Medical History: Past Medical History:  Diagnosis Date   A-fib ([place])    Asthma    Cerebral vasculitis    Congestive dilated cardiomyopathy (HCC)    COPD (chronic obstructive pulmonary disease) (HCC)    Fibromyalgia    Gastric polyp    GERD (gastroesophageal reflux disease)    Hiatal hernia    Hyperparathyroidism (HCC)    IBS (irritable bowel syndrome)    MVP (mitral valve prolapse)    Osteoarthritis    Ovarian cancer (HCC)    lymph node  removal with hysterectomy   Raynaud's disease    RLS (restless legs syndrome)    Situational depression    Sjogren's syndrome (HCC)    Vasculitis (HCC)    Vitamin D deficiency     Medications:  Medications Prior to Admission  Medication Sig Dispense Refill Last Dose   albuterol (VENTOLIN HFA) 108 (90 Base) MCG/ACT inhaler INHALE 2 PUFFS BY MOUTH EVERY 6 HOURS AS NEEDED FOR WHEEZING (Patient taking differently: Inhale 1-2 puffs into the lungs every 6 (six) hours as needed for wheezing or shortness of breath.) 9 g 0 PRN   atenolol (TENORMIN) 25 MG tablet Take 0.5 tablets (12.5 mg total) by mouth 2 (two) times daily. 60 tablet 5 10/17/2020 at 0800   fexofenadine (ALLEGRA) 180 MG tablet Take 180 mg by mouth every morning.    10/17/2020 at 0800   hydrocortisone-pramoxine [facility]) 2.5-1 % rectal cream Place 1 application rectally See admin instructions. After each bathroom use   Past Month   levothyroxine (EUTHYROX) 50 MCG tablet Take 2 tablets (100 mcg total) by mouth every morning. 180  tablet 3 10/17/2020 at 0300   methocarbamol (ROBAXIN) 750 MG tablet Take 750 mg by mouth 4 (four) times daily.   10/17/2020   oxyCODONE-acetaminophen (PERCOCET) 7.5-325 MG tablet Take 1 tablet by mouth every 6 (six) hours as needed for severe pain.   10/16/2020   predniSONE (DELTASONE) 5 MG tablet Take 1 tablet (5 mg total) by mouth every morning. 90 tablet 1 10/17/2020 at 0800   RESTASIS 0.05 % ophthalmic emulsion INSTILL 1 DROP INTO EACH EYE TWICE DAILY (Patient taking differently: Place 1 drop into both eyes 2 (two) times daily.) 120 each 0 10/17/2020   warfarin (COUMADIN) 1 MG tablet TAKE 4MG  MONDAY, WEDNESDAY, AND FRIDAY. TAKE 2 MG TUESDAY, THURSDAY, SATURDAY, AND SUNDAY 90 tablet 3 10/16/2020 at 2100   azithromycin (ZITHROMAX Z-PAK) 250 MG tablet As directed (Patient not taking: Reported on 10/17/2020) 6 tablet 0 Completed Course   Scheduled:   atenolol  12.5 mg Oral BID   cycloSPORINE  1 drop Both Eyes BID   levothyroxine  100 mcg Oral q morning   loratadine  10 mg Oral Daily   methocarbamol  750 mg Oral QID   oxyCODONE-acetaminophen  1 tablet Oral Q6H   pantoprazole (PROTONIX) IV  40 mg Intravenous Q24H   predniSONE  5 mg Oral q morning   Infusions:   sodium chloride 50 mL/hr at 10/23/20 1804   heparin 1,300 Units/hr (10/24/20 0509)   promethazine (PHENERGAN) injection (IM or IVPB) Stopped (10/23/20 1611)   PRN: acetaminophen **OR** acetaminophen, albuterol, fentaNYL (SUBLIMAZE) injection, ondansetron **OR** ondansetron (ZOFRAN) IV, promethazine (PHENERGAN) injection (IM or IVPB), simethicone Anti-infectives (From admission, onward)    Start     Dose/Rate Route Frequency Ordered Stop   10/23/20 1400  meropenem (MERREM) 1 g in sodium chloride 0.9 % 100 mL IVPB        1 g 200 mL/hr over 30 Minutes Intravenous Every 12 hours 10/23/20 1310 10/23/20 2148   10/17/20 2200  meropenem (MERREM) 1 g in sodium chloride 0.9 % 100 mL IVPB  Status:  Discontinued        1 g 200 mL/hr over 30 Minutes  Intravenous Every 12 hours 10/17/20 1544 10/23/20 1106   10/17/20 1315  meropenem (MERREM) 1 g in sodium chloride 0.9 % 100 mL IVPB        1 g 200 mL/hr over 30 Minutes Intravenous  Once 10/17/20 1307 10/17/20 1518  Assessment: Pharmacy has been consulted to restart heparin gtt 10/11 AM and bridge to warfarin INR goal 2-3. Heparin had been held due to surgery but was therapeutic HL 0.41 on heparin iv 1300units/hr. Vit K given to reverse anticoag on 10/4 and 10/5.   Home dose of coumadin is 4mg  M,W,F then 2mg  on T, TH, Sat, Sun. Will give slight booster dose d/t vit k given Lap chole 10/10. Discussed with MD and will bridge with lovenox.  INR 1.1  Goal of Therapy:  INR 2-3 Heparin level 0.3-0.7 units/ml Monitor platelets by anticoagulation protocol: Yes   Plan:   D/c heparin Lovenox 1mg /kg (60mg ) sq q12h Warfarin 5 mg po x 1 today Daily PT-INR Continue to monitor H&H and platelets  Isac Sarna, BS [place], BCPS Clinical Pharmacist Pager [phone] 10/24/2020,10:32 AM

## 2020-10-24 NOTE — Progress Notes (Signed)
1 Day Post-Op  Subjective: Patient complains about a little nausea.  She has minimal incisional pain.  Has various complaints of pain which appear to be chronic in nature.  Objective: Vital signs in last 24 hours: Temp:  [97.5 F (36.4 C)-98.2 F (36.8 C)] 98 F (36.7 C) (10/11 0515) Pulse Rate:  [49-74] 60 (10/11 0515) Resp:  [13-18] 16 (10/11 0515) BP: (116-172)/(66-84) 116/66 (10/11 0515) SpO2:  [91 %-95 %] 93 % (10/11 0515) Last BM Date: 10/22/20  Intake/Output from previous day: 10/10 0701 - 10/11 0700 In: 2513.3 [P.O.:720; I.V.:1645.2; IV Piggyback:148.1] Out: 1015 [Urine:1000; Blood:15] Intake/Output this shift: No intake/output data recorded.  General appearance: alert, cooperative, and no distress GI: Soft, flat, incisions healing well.  Lab Results:  Recent Labs    10/23/20 0431 10/24/20 0512  WBC 6.0 10.8*  HGB 12.2 12.2  HCT 37.7 37.5  PLT 145* 148*   BMET Recent Labs    10/23/20 0431 10/24/20 0512  NA 135 136  K 4.1 4.1  CL 104 104  CO2 26 27  GLUCOSE 76 123*  BUN 13 12  CREATININE 0.68 0.65  CALCIUM 7.9* 8.0*   PT/INR Recent Labs    10/24/20 0512  LABPROT 14.4  INR 1.1    Studies/Results: No results found.  Anti-infectives: Anti-infectives (From admission, onward)    Start     Dose/Rate Route Frequency Ordered Stop   10/23/20 1400  meropenem (MERREM) 1 g in sodium chloride 0.9 % 100 mL IVPB        1 g 200 mL/hr over 30 Minutes Intravenous Every 12 hours 10/23/20 1310 10/23/20 2148   10/17/20 2200  meropenem (MERREM) 1 g in sodium chloride 0.9 % 100 mL IVPB  Status:  Discontinued        1 g 200 mL/hr over 30 Minutes Intravenous Every 12 hours 10/17/20 1544 10/23/20 1106   10/17/20 1315  meropenem (MERREM) 1 g in sodium chloride 0.9 % 100 mL IVPB        1 g 200 mL/hr over 30 Minutes Intravenous  Once 10/17/20 1307 10/17/20 1518       Assessment/Plan: s/p Procedure(s): LAPAROSCOPIC CHOLECYSTECTOMY Impression: Stable on  postoperative day 1 from the surgical standpoint.  May restart anticoagulation today.  Discharge planning per hospitalist.  LOS: 7 days    [name] Signs 10/24/2020

## 2020-10-24 NOTE — Plan of Care (Signed)
  Problem: Acute Rehab PT Goals(only PT should resolve) Goal: Pt Will Go Supine/Side To Sit Outcome: Progressing Flowsheets (Taken 10/24/2020 1134) Pt will go Supine/Side to Sit: with moderate assist Goal: Pt Will Go Sit To Supine/Side Outcome: Progressing Flowsheets (Taken 10/24/2020 1134) Pt will go Sit to Supine/Side: with moderate assist Goal: Patient Will Transfer Sit To/From Stand Outcome: Progressing Flowsheets (Taken 10/24/2020 1134) Patient will transfer sit to/from stand: with moderate assist Goal: Pt Will Transfer Bed To Chair/Chair To Bed Outcome: Progressing Flowsheets (Taken 10/24/2020 1134) Pt will Transfer Bed to Chair/Chair to Bed: with mod assist Goal: Pt Will Ambulate Outcome: Progressing Flowsheets (Taken 10/24/2020 1134) Pt will Ambulate:  25 feet  with minimal assist  with rolling walker   Tori Eustace Hur PT, DPT 10/24/20, 11:35 AM

## 2020-10-25 DIAGNOSIS — K8001 Calculus of gallbladder with acute cholecystitis with obstruction: Secondary | ICD-10-CM | POA: Diagnosis not present

## 2020-10-25 DIAGNOSIS — K8050 Calculus of bile duct without cholangitis or cholecystitis without obstruction: Secondary | ICD-10-CM | POA: Diagnosis not present

## 2020-10-25 LAB — COMPREHENSIVE METABOLIC PANEL
ALT: 39 U/L (ref 0–44)
AST: 40 U/L (ref 15–41)
Albumin: 2.5 g/dL — ABNORMAL LOW (ref 3.5–5.0)
Alkaline Phosphatase: 163 U/L — ABNORMAL HIGH (ref 38–126)
Anion gap: 6 (ref 5–15)
BUN: 12 mg/dL (ref 8–23)
CO2: 29 mmol/L (ref 22–32)
Calcium: 8.1 mg/dL — ABNORMAL LOW (ref 8.9–10.3)
Chloride: 105 mmol/L (ref 98–111)
Creatinine, Ser: 0.58 mg/dL (ref 0.44–1.00)
GFR, Estimated: >60 mL/min (ref >60)
Glucose, Bld: 75 mg/dL (ref 70–99)
Potassium: 3.6 mmol/L (ref 3.5–5.1)
Sodium: 140 mmol/L (ref 135–145)
Total Bilirubin: 1 mg/dL (ref 0.3–1.2)
Total Protein: 4.5 g/dL — ABNORMAL LOW (ref 6.5–8.1)

## 2020-10-25 LAB — PROTIME-INR
INR: 1.3 — ABNORMAL HIGH (ref 0.8–1.2)
Prothrombin Time: 16.4 seconds — ABNORMAL HIGH (ref 11.4–15.2)

## 2020-10-25 LAB — CBC
HCT: 38 % (ref 36.0–46.0)
Hemoglobin: 12.5 g/dL (ref 12.0–15.0)
MCH: 33 pg (ref 26.0–34.0)
MCHC: 32.9 g/dL (ref 30.0–36.0)
MCV: 100.3 fL — ABNORMAL HIGH (ref 80.0–100.0)
Platelets: 154 10*3/uL (ref 150–400)
RBC: 3.79 MIL/uL — ABNORMAL LOW (ref 3.87–5.11)
RDW: 15.5 % (ref 11.5–15.5)
WBC: 8.5 10*3/uL (ref 4.0–10.5)
nRBC: 0 % (ref 0.0–0.2)

## 2020-10-25 LAB — MAGNESIUM: Magnesium: 1.8 mg/dL (ref 1.7–2.4)

## 2020-10-25 MED ORDER — FENTANYL CITRATE PF 50 MCG/ML IJ SOSY: 12.5000 ug | PREFILLED_SYRINGE | INTRAMUSCULAR | Status: DC | PRN

## 2020-10-25 MED ORDER — WARFARIN SODIUM 5 MG PO TABS
5.0000 mg | ORAL_TABLET | Freq: Once | ORAL | Status: AC
  Administered 2020-10-25: 5 mg via ORAL
  Filled 2020-10-25: qty 1

## 2020-10-25 MED ORDER — PANTOPRAZOLE SODIUM 40 MG PO TBEC
40.0000 mg | DELAYED_RELEASE_TABLET | Freq: Every day | ORAL | Status: DC
  Administered 2020-10-25 – 2020-10-28 (×4): 40 mg via ORAL
  Filled 2020-10-25 (×4): qty 1

## 2020-10-25 NOTE — TOC Initial Note (Signed)
Transition of Care [facility]) - Initial/Assessment Note    Patient Details  Name: Angela Wong MRN: [id number] Date of Birth: Apr 02, 1940  [facility] CM/SW Contact:    Boneta Lucks, RN Phone Number: 10/25/2020, 3:26 PM  Clinical Narrative:     Patient admitted with Choledocholithiasis. Patient lives home alone. She does have part time sitters and a sitter that stays 3 to 4 days a week day and night.. PT is recommending SNF. Patient is refusing. She had a bad experience at [facility] and does not want to go to any SNF. Patient is currently active with [facility]. She has had [facility] in the past and rather have [facility] for come in for her home health. Santiago Glad updated and will cancel. New orders will be submitted to [facility]. MD ordered hospital bed. Caryl Pina accepted the referral.                Expected Discharge Plan: [place] Barriers to Discharge: Continued Medical Work up   Patient Goals and CMS Choice Patient states their goals for this hospitalization and ongoing recovery are:: to go home. CMS Medicare.gov Compare Post Acute Care list provided to:: Patient Choice offered to / list presented to : Patient  Expected Discharge Plan and Services Expected Discharge Plan: [place]      Living arrangements for the past 2 months: [place]                   DME Agency: [employer] Date DME Agency Contacted: 10/25/20 Time DME Agency Contacted: 1000 Representative spoke with at DME Agency: [place]: RN, [place], OT [place] Agency: Anita ([place]) Date [place]: 10/25/20 Time [place]: 1000 Representative spoke with at [place]: Romualdo Bolk  Prior Living Arrangements/Services Living arrangements for the past 2 months: [place] with:: Self Patient language and need for interpreter reviewed:: Yes Do you feel safe going back to the place where you live?: Yes      Need for Family  Participation in Patient Care: Yes (Comment) Care giver support system in place?: Yes (comment) Current home services: DME Criminal Activity/Legal Involvement Pertinent to Current Situation/Hospitalization: No - Comment as needed  Activities of Daily Living Home Assistive Devices/Equipment: Gilford Rile (specify type) (front wheel) ADL Screening (condition at time of admission) Patient's cognitive ability adequate to safely complete daily activities?: Yes Is the patient deaf or have difficulty hearing?: No Does the patient have difficulty seeing, even when wearing glasses/contacts?: No Does the patient have difficulty concentrating, remembering, or making decisions?: No Patient able to express need for assistance with ADLs?: Yes Does the patient have difficulty dressing or bathing?: Yes Independently performs ADLs?: No Does the patient have difficulty walking or climbing stairs?: Yes Weakness of Legs: Both Weakness of Arms/Hands: Both  Permission Sought/Granted     Emotional Assessment     Affect (typically observed): Accepting, Frustrated Orientation: : Oriented to Self, Oriented to Place, Oriented to  Time, Oriented to Situation Alcohol / Substance Use: Not Applicable Psych Involvement: No (comment)  Admission diagnosis:  Choledocholithiasis [K80.50] Patient Active Problem List   Diagnosis Date Noted   Pain    Calculus of gallbladder with acute cholecystitis and obstruction    Choledocholithiasis 10/17/2020   Elevated LFTs    Dysphagia    Multiple allergies 09/29/2020   Pacemaker 07/22/2019   Drug intolerance 07/22/2019   Edema of both lower extremities 07/22/2019   Subtherapeutic international  normalized ratio (INR) 07/05/2019   CAD (coronary artery disease) 07/05/2019   Non-STEMI (non-ST elevated myocardial infarction) ([place]) 07/01/2019   Chronic constipation 06/29/2019   Hypertensive heart disease with heart failure ([place]) 06/29/2019   Decompensated heart failure ([place])  06/22/2019   Pressure injury of skin 06/22/2019   RA (rheumatoid arthritis) ([place]) 05/04/2019   Anticoagulation goal of INR 2 to 3 12/18/2017   Depression, major, single episode, moderate (HCC) 09/12/2017   Chest pain 08/25/2017   Generalized weakness 08/25/2017   Seasonal and perennial allergic rhinitis 12/31/2016   Allergic rhinitis with a nonallergic component 10/01/2016   Itching 10/01/2016   Skeeter syndrome 10/01/2016   Steroid-induced osteopenia 07/11/2016   Hyperparathyroidism ([place]) 01/16/2016   Iron deficiency anemia 12/18/2015   Insomnia 12/18/2015   Hypothyroidism 12/18/2015   Rotator cuff tear 03/23/2014   Osteoarthritis    Moderate persistent asthma without complication    Ovarian cancer ([place])    Fibromyalgia    RLS (restless legs syndrome)    Cerebral vasculitis    Sjogren's syndrome ([place])    Raynaud's disease    MVP (mitral valve prolapse)    IBS (irritable bowel syndrome)    Congestive dilated cardiomyopathy ([place]) 02/09/2014   Dyspnea 12/13/2013   Dyslipidemia 11/26/2013   Malaise and fatigue 11/26/2013   Allergy to multiple antibiotics 11/02/2013   History of ovarian cancer 10/01/2013   GERD (gastroesophageal reflux disease) 10/01/2013   Hypertension 10/01/2013   Atrial fibrillation, permanent ([place]) 07/27/2013   PCP:  [name], FNP Pharmacy:   [facility] [address] [address] [place] [address] Phone: [phone] Fax: [phone]   Readmission Risk Interventions Readmission Risk Prevention Plan 10/25/2020  Transportation Screening Complete  HRI or Home Care Consult Complete  Social Work Consult for [place] Planning/Counseling Complete  Palliative Care Screening Not Applicable  Medication Review Press photographer) Complete  Some recent data might be hidden

## 2020-10-25 NOTE — Progress Notes (Signed)
ANTICOAGULATION CONSULT NOTE -   Pharmacy Consult for lovenox and warfarin bridge  Indication: atrial fibrillation  Allergies  Allergen Reactions   Cephalosporins Hives and Shortness Of Breath   Ciprofloxacin Hives and Shortness Of Breath   Diltiazem Shortness Of Breath    Swollen throat    Doxycycline Hives and Shortness Of Breath   Horse-Derived Products Anaphylaxis   Ketek [Telithromycin] Palpitations    Chest discomfort   Nitrofuran Derivatives Anaphylaxis and Hives    blisters   Nitrous Oxide Nausea And Vomiting    Severe due to Sjogrens (Auto-Immune Disease)   Other Anaphylaxis    ALLERGY TO HORSE SERUM   Penicillins Hives and Shortness Of Breath        Pentazocine Other (See Comments)    Other reaction(s): Mental Status Changes (intolerance) Altered Mental Status  Altered Mental Status  Altered Mental Status    Sulfa Antibiotics Hives and Shortness Of Breath   Trovan [Alatrofloxacin] Palpitations, Other (See Comments) and Anaphylaxis    Chest pain, dizziness, irregular pulse   Vitamin B12     Confusion and shaking    Zinc Gelatin [Zinc] Anaphylaxis   Amlodipine Swelling   Calcium Channel Blockers     Respiratory distress   Carvedilol Other (See Comments)    Dizziness, "joint pain, depression"   Clindamycin/Lincomycin     CP, lock jaw   Codeine Nausea Only   Cymbalta [Duloxetine Hcl] Swelling   Diovan [Valsartan] Swelling   Lisinopril Swelling   Sertraline Other (See Comments)    confusion   Tramadol Nausea Only   Loteprednol Etabonate Rash    Patient Measurements: Height: 5\' 4"  (162.6 cm) Weight: 59.4 kg (130 lb 15.3 oz) IBW/kg (Calculated) : 54.7 HEPARIN DW (KG): 59.4  Vital Signs: Temp: 98 F (36.7 C) (10/12 0418) Temp Source: Oral (10/12 0418) BP: 119/61 (10/12 0418) Pulse Rate: 60 (10/12 0418)  Labs: Recent Labs    10/22/20 0942 10/23/20 0431 10/23/20 0431 10/24/20 0512 10/25/20 0459  HGB  --  12.2   < > 12.2 12.5  HCT  --  37.7   --  37.5 38.0  PLT  --  145*  --  148* 154  LABPROT  --   --   --  14.4 16.4*  INR  --   --   --  1.1 1.3*  HEPARINUNFRC 0.41  --   --   --   --   CREATININE  --  0.68  --  0.65 0.58   < > = values in this interval not displayed.     Estimated Creatinine Clearance: 47.6 mL/min (by C-G formula based on SCr of 0.58 mg/dL).   Medical History: Past Medical History:  Diagnosis Date   A-fib ([place])    Asthma    Cerebral vasculitis    Congestive dilated cardiomyopathy (HCC)    COPD (chronic obstructive pulmonary disease) (HCC)    Fibromyalgia    Gastric polyp    GERD (gastroesophageal reflux disease)    Hiatal hernia    Hyperparathyroidism (HCC)    IBS (irritable bowel syndrome)    MVP (mitral valve prolapse)    Osteoarthritis    Ovarian cancer (HCC)    lymph node removal with hysterectomy   Raynaud's disease    RLS (restless legs syndrome)    Situational depression    Sjogren's syndrome (HCC)    Vasculitis (HCC)    Vitamin D deficiency     Medications:  Medications Prior to Admission  Medication Sig Dispense  Refill Last Dose   albuterol (VENTOLIN HFA) 108 (90 Base) MCG/ACT inhaler INHALE 2 PUFFS BY MOUTH EVERY 6 HOURS AS NEEDED FOR WHEEZING (Patient taking differently: Inhale 1-2 puffs into the lungs every 6 (six) hours as needed for wheezing or shortness of breath.) 9 g 0 PRN   atenolol (TENORMIN) 25 MG tablet Take 0.5 tablets (12.5 mg total) by mouth 2 (two) times daily. 60 tablet 5 10/17/2020 at 0800   fexofenadine (ALLEGRA) 180 MG tablet Take 180 mg by mouth every morning.    10/17/2020 at 0800   hydrocortisone-pramoxine [facility]) 2.5-1 % rectal cream Place 1 application rectally See admin instructions. After each bathroom use   Past Month   levothyroxine (EUTHYROX) 50 MCG tablet Take 2 tablets (100 mcg total) by mouth every morning. 180 tablet 3 10/17/2020 at 0300   methocarbamol (ROBAXIN) 750 MG tablet Take 750 mg by mouth 4 (four) times daily.   10/17/2020    oxyCODONE-acetaminophen (PERCOCET) 7.5-325 MG tablet Take 1 tablet by mouth every 6 (six) hours as needed for severe pain.   10/16/2020   predniSONE (DELTASONE) 5 MG tablet Take 1 tablet (5 mg total) by mouth every morning. 90 tablet 1 10/17/2020 at 0800   RESTASIS 0.05 % ophthalmic emulsion INSTILL 1 DROP INTO EACH EYE TWICE DAILY (Patient taking differently: Place 1 drop into both eyes 2 (two) times daily.) 120 each 0 10/17/2020   warfarin (COUMADIN) 1 MG tablet TAKE 4MG  MONDAY, WEDNESDAY, AND FRIDAY. TAKE 2 MG TUESDAY, THURSDAY, SATURDAY, AND SUNDAY 90 tablet 3 10/16/2020 at 2100   azithromycin (ZITHROMAX Z-PAK) 250 MG tablet As directed (Patient not taking: Reported on 10/17/2020) 6 tablet 0 Completed Course   Scheduled:   atenolol  12.5 mg Oral BID   cycloSPORINE  1 drop Both Eyes BID   enoxaparin (LOVENOX) injection  60 mg Subcutaneous Q12H   furosemide  40 mg Intravenous Daily   levothyroxine  100 mcg Oral q morning   loratadine  10 mg Oral Daily   methocarbamol  750 mg Oral QID   oxyCODONE-acetaminophen  1 tablet Oral Q6H   pantoprazole (PROTONIX) IV  40 mg Intravenous Q24H   predniSONE  5 mg Oral q morning   Warfarin - Pharmacist Dosing Inpatient   Does not apply q1600   Infusions:   promethazine (PHENERGAN) injection (IM or IVPB) Stopped (10/23/20 1611)   PRN: acetaminophen **OR** acetaminophen, albuterol, fentaNYL (SUBLIMAZE) injection, ondansetron **OR** ondansetron (ZOFRAN) IV, promethazine (PHENERGAN) injection (IM or IVPB), simethicone Anti-infectives (From admission, onward)    Start     Dose/Rate Route Frequency Ordered Stop   10/23/20 1400  meropenem (MERREM) 1 g in sodium chloride 0.9 % 100 mL IVPB        1 g 200 mL/hr over 30 Minutes Intravenous Every 12 hours 10/23/20 1310 10/23/20 2148   10/17/20 2200  meropenem (MERREM) 1 g in sodium chloride 0.9 % 100 mL IVPB  Status:  Discontinued        1 g 200 mL/hr over 30 Minutes Intravenous Every 12 hours 10/17/20 1544 10/23/20  1106   10/17/20 1315  meropenem (MERREM) 1 g in sodium chloride 0.9 % 100 mL IVPB        1 g 200 mL/hr over 30 Minutes Intravenous  Once 10/17/20 1307 10/17/20 1518       Assessment: Pharmacy has been consulted to restart heparin gtt 10/11 AM and bridge to warfarin INR goal 2-3. Heparin had been held due to surgery but was therapeutic HL  0.41 on heparin iv 1300units/hr. Vit K given to reverse anticoag on 10/4 and 10/5.   Home dose of coumadin is 4mg  M,W,F then 2mg  on T, TH, Sat, Sun. Will give slight booster dose d/t vit k given Lap chole 10/10. Discussed with MD and will bridge with lovenox.  INR 1.1> 1.3, will give another booster dose.  Goal of Therapy:  INR 2-3 Heparin level 0.3-0.7 units/ml Monitor platelets by anticoagulation protocol: Yes   Plan:   Continue Lovenox 1mg /kg (60mg ) sq q12h Warfarin 5 mg po x 1 today Daily PT-INR Continue to monitor H&H and platelets  Isac Sarna, BS Vena [place], BCPS Clinical Pharmacist Pager [phone] 10/25/2020,8:08 AM

## 2020-10-25 NOTE — Progress Notes (Signed)
PROGRESS NOTE    Angela Wong  LNL:[id number] DOB: 04/02/1940 DOA: 10/17/2020 PCP: [name], FNP   Brief Narrative:   Angela Wong is a 80 y.o. female with medical history significant for atrial fibrillation on warfarin, sick sinus syndrome with pacemaker, hypertension, congestive cardiomyopathy, prior NSTEMI with thrombus in distal PLA secondary to subtherapeutic INR, Sjogren's syndrome on chronic prednisone, hypothyroidism, and COPD who presented to the ED with worsening nausea, vomiting, diarrhea that appears to have began over the last few days.  Patient was admitted with acute cholangitis with choledocholithiasis and has undergone ERCP with stone extraction on 10/6 and is now now status post laparoscopic cholecystectomy on 10/10.  She is now on bridging therapy with full dose Lovenox as she becomes therapeutic in her Coumadin levels.  She will need INR greater than 2 on discharge since she has had prior NSTEMI due to thrombus with subtherapeutic INR.  Assessment & Plan:   Active Problems:   Choledocholithiasis   Calculus of gallbladder with acute cholecystitis and obstruction   Pain   Acute choledocholithiasis with cholangitis with associated E Coli bacteremia -Status post ERCP with stone extraction on 10/6 and status post laparoscopic cholecystectomy 10/10 -Resumed diet per general surgery -Symptomatic management -Merrem has been discontinued with source control   Transaminitis secondary to above-Resolving        -Continue to trend prn   Spinal stenosis -Ongoing low back pain with percocet scheduled -Fentanyl for breakthrough pain -Muscle relaxers now scheduled -PT evaluation recommending SNF, but she wants to go home -Order for hospital bed placed  BLE dependent edema         -Likely due to IVF during this admission         -Continue IV lasix         -Daily BMP   History of paroxysmal atrial fibrillation -Resumed Lovenox on 10/11 AM along with warfarin  for bridging -Monitor on telemetry -Continue atenolol -Currently rate controlled   History of COPD -No acute bronchospasms -Breathing treatments as needed   History of hypothyroidism -Continue levothyroxine   History of Sjogren's syndrome -Continue on prednisone and Restasis     DVT prophylaxis: Full dose Lovenox for bridging while on warfarin started 10/11 Code Status: Full Family Communication: None at bedside Disposition Plan:  Status is: Inpatient   Remains inpatient appropriate because:Ongoing diagnostic testing needed not appropriate for outpatient work up, IV treatments appropriate due to intensity of illness or inability to take PO, and Inpatient level of care appropriate due to severity of illness   Dispo: The patient is from: Home              Anticipated d/c is to: Home              Patient currently is not medically stable to d/c.              Difficult to place patient No     Skin Assessment:   I have examined the patient's skin and I agree with the wound assessment as performed by the wound care RN as outlined below:   Pressure Injury 06/22/19 Sacrum Left Stage 2 -  Partial thickness loss of dermis presenting as a shallow open injury with a red, pink wound bed without slough. (Active)  06/22/19 2102  Location: Sacrum  Location Orientation: Left  Staging: Stage 2 -  Partial thickness loss of dermis presenting as a shallow open injury with a red, pink wound bed without slough.  Wound Description (Comments):   Present on Admission: Yes      Consultants:  GI General Surgery   Procedures:  ERCP with stone extraction 10/6 Laparoscopic cholecystectomy 10/10  Antimicrobials:  Anti-infectives (From admission, onward)    Start     Dose/Rate Route Frequency Ordered Stop   10/23/20 1400  meropenem (MERREM) 1 g in sodium chloride 0.9 % 100 mL IVPB        1 g 200 mL/hr over 30 Minutes Intravenous Every 12 hours 10/23/20 1310 10/23/20 2148   10/17/20 2200   meropenem (MERREM) 1 g in sodium chloride 0.9 % 100 mL IVPB  Status:  Discontinued        1 g 200 mL/hr over 30 Minutes Intravenous Every 12 hours 10/17/20 1544 10/23/20 1106   10/17/20 1315  meropenem (MERREM) 1 g in sodium chloride 0.9 % 100 mL IVPB        1 g 200 mL/hr over 30 Minutes Intravenous  Once 10/17/20 1307 10/17/20 1518       Subjective: Patient reports significant back pain, with also BLE edema. Reluctant about going home with INR <2 due to hx of CAD  Objective: Vitals:   10/24/20 2145 10/25/20 0411 10/25/20 0418 10/25/20 1505  BP: 122/63  119/61 114/64  Pulse: (!) 57  60 (!) 59  Resp: 16  16 17   Temp: 97.7 F (36.5 C)  98 F (36.7 C) 98.2 F (36.8 C)  TempSrc: Oral  Oral Oral  SpO2: 96%  95% 97%  Weight:  59.4 kg    Height:  5\' 4"  (1.626 m)      Intake/Output Summary (Last 24 hours) at 10/25/2020 1816 Last data filed at 10/25/2020 1200 Gross per 24 hour  Intake 770 ml  Output 2850 ml  Net -2080 ml   Filed Weights   10/17/20 0755 10/18/20 0318 10/25/20 0411  Weight: 49.4 kg 58.3 kg 59.4 kg    Examination: General: NAD  Cardiovascular: S1, S2 present Respiratory: CTAB Abdomen: Soft, nontender, nondistended, bowel sounds present, noted incisions c/d/i Musculoskeletal: 2+ bilateral pedal edema noted Skin: Sacral ulcer noted above Psychiatry: Fair mood      Data Reviewed: I have personally reviewed following labs and imaging studies  CBC: Recent Labs  Lab 10/21/20 0242 10/22/20 0639 10/23/20 0431 10/24/20 0512 10/25/20 0459  WBC 6.6 7.1 6.0 10.8* 8.5  HGB 12.2 13.2 12.2 12.2 12.5  HCT 37.9 40.7 37.7 37.5 38.0  MCV 98.2 99.0 100.3* 99.7 100.3*  PLT 109* 129* 145* 148* 194   Basic Metabolic Panel: Recent Labs  Lab 10/21/20 0242 10/22/20 0639 10/23/20 0431 10/24/20 0512 10/25/20 0459  NA 133* 136 135 136 140  K 4.0 4.0 4.1 4.1 3.6  CL 104 107 104 104 105  CO2 25 25 26 27 29   GLUCOSE 86 81 76 123* 75  BUN 13 14 13 12 12    CREATININE 0.61 0.70 0.68 0.65 0.58  CALCIUM 8.1* 8.4* 7.9* 8.0* 8.1*  MG 1.9 1.7 1.7 1.6* 1.8   GFR: Estimated Creatinine Clearance: 47.6 mL/min (by C-G formula based on SCr of 0.58 mg/dL). Liver Function Tests: Recent Labs  Lab 10/21/20 0242 10/22/20 0639 10/23/20 0431 10/24/20 0512 10/25/20 0459  AST 28 18 15 21  40  ALT 74* 59* 45* 37 39  ALKPHOS 224* 213* 174* 169* 163*  BILITOT 2.1* 1.5* 1.1 0.8 1.0  PROT 4.4* 5.1* 4.6* 4.5* 4.5*  ALBUMIN 2.5* 2.8* 2.6* 2.5* 2.5*   Recent Labs  Lab 10/20/20 1740  AMYLASE 43   No results for input(s): AMMONIA in the last 168 hours. Coagulation Profile: Recent Labs  Lab 10/19/20 0503 10/20/20 0525 10/24/20 0512 10/25/20 0459  INR 1.3* 1.2 1.1 1.3*   Cardiac Enzymes: No results for input(s): CKTOTAL, CKMB, CKMBINDEX, TROPONINI in the last 168 hours. BNP (last 3 results) No results for input(s): PROBNP in the last 8760 hours. HbA1C: No results for input(s): HGBA1C in the last 72 hours. CBG: No results for input(s): GLUCAP in the last 168 hours. Lipid Profile: No results for input(s): CHOL, HDL, LDLCALC, TRIG, CHOLHDL, LDLDIRECT in the last 72 hours. Thyroid Function Tests: No results for input(s): TSH, T4TOTAL, FREET4, T3FREE, THYROIDAB in the last 72 hours. Anemia Panel: No results for input(s): VITAMINB12, FOLATE, FERRITIN, TIBC, IRON, RETICCTPCT in the last 72 hours. Sepsis Labs: No results for input(s): PROCALCITON, LATICACIDVEN in the last 168 hours.   Recent Results (from the past 240 hour(s))  Blood culture (routine x 2)     Status: Abnormal   Collection Time: 10/17/20  1:17 PM   Specimen: BLOOD RIGHT HAND  Result Value Ref Range Status   Specimen Description   Final    BLOOD RIGHT HAND Performed at [facility], [address], [place], [place] [address]    Special Requests   Final    Blood Culture results may not be optimal due to an excessive volume of blood received in culture bottles BOTTLES DRAWN AEROBIC  AND ANAEROBIC Performed at [facility], [address] [place], [place] [address]    Culture  Setup Time   Final    GRAM NEGATIVE RODS IN BOTH AEROBIC AND ANAEROBIC BOTTLES Gram Stain Report Called to,Read Back By and Verified With: MORRIS,B AT 0945 ON 10.6.22 BY RUCINSKI,B Organism ID to follow CRITICAL RESULT CALLED TO, READ BACK BY AND VERIFIED WITHDarnell Level COFFEE PHARMD 1427 10/19/20 A BROWNING Performed at [facility], [address], [place], [place] [address]    Culture ESCHERICHIA COLI (A)  Final   Report Status 10/21/2020 FINAL  Final   Organism ID, Bacteria ESCHERICHIA COLI  Final      Susceptibility   Escherichia coli - MIC*    AMPICILLIN <=2 SENSITIVE Sensitive     CEFAZOLIN <=4 SENSITIVE Sensitive     CEFEPIME <=0.12 SENSITIVE Sensitive     CEFTAZIDIME <=1 SENSITIVE Sensitive     CEFTRIAXONE <=0.25 SENSITIVE Sensitive     CIPROFLOXACIN <=0.25 SENSITIVE Sensitive     GENTAMICIN <=1 SENSITIVE Sensitive     IMIPENEM <=0.25 SENSITIVE Sensitive     TRIMETH/SULFA <=20 SENSITIVE Sensitive     AMPICILLIN/SULBACTAM <=2 SENSITIVE Sensitive     PIP/TAZO <=4 SENSITIVE Sensitive     * ESCHERICHIA COLI  Blood culture (routine x 2)     Status: None   Collection Time: 10/17/20  1:17 PM   Specimen: BLOOD LEFT FOREARM  Result Value Ref Range Status   Specimen Description BLOOD LEFT FOREARM  Final   Special Requests   Final    Blood Culture results may not be optimal due to an excessive volume of blood received in culture bottles BOTTLES DRAWN AEROBIC AND ANAEROBIC   Culture   Final    NO GROWTH 6 DAYS Performed at [facility], [address] [place], [place] [address]    Report Status 10/23/2020 FINAL  Final  Blood Culture ID Panel (Reflexed)     Status: Abnormal   Collection Time: 10/17/20  1:17 PM  Result Value Ref Range Status  Enterococcus faecalis NOT DETECTED NOT DETECTED Final   Enterococcus Faecium NOT DETECTED NOT DETECTED Final   Listeria monocytogenes NOT DETECTED NOT  DETECTED Final   Staphylococcus species NOT DETECTED NOT DETECTED Final   Staphylococcus aureus (BCID) NOT DETECTED NOT DETECTED Final   Staphylococcus epidermidis NOT DETECTED NOT DETECTED Final   Staphylococcus lugdunensis NOT DETECTED NOT DETECTED Final   Streptococcus species NOT DETECTED NOT DETECTED Final   Streptococcus agalactiae NOT DETECTED NOT DETECTED Final   Streptococcus pneumoniae NOT DETECTED NOT DETECTED Final   Streptococcus pyogenes NOT DETECTED NOT DETECTED Final   A.calcoaceticus-baumannii NOT DETECTED NOT DETECTED Final   Bacteroides fragilis NOT DETECTED NOT DETECTED Final   Enterobacterales DETECTED (A) NOT DETECTED Final    Comment: Enterobacterales represent a large order of gram negative bacteria, not a single organism. CRITICAL RESULT CALLED TO, READ BACK BY AND VERIFIED WITH: G COFFEE PHARMD 1427 10/19/20 A BROWNING    Enterobacter cloacae complex NOT DETECTED NOT DETECTED Final   Escherichia coli DETECTED (A) NOT DETECTED Final    Comment: CRITICAL RESULT CALLED TO, READ BACK BY AND VERIFIED WITH: G COFFEE PHARMD 1427 10/19/20 A BROWNING    Klebsiella aerogenes NOT DETECTED NOT DETECTED Final   Klebsiella oxytoca NOT DETECTED NOT DETECTED Final   Klebsiella pneumoniae NOT DETECTED NOT DETECTED Final   Proteus species NOT DETECTED NOT DETECTED Final   Salmonella species NOT DETECTED NOT DETECTED Final   Serratia marcescens NOT DETECTED NOT DETECTED Final   Haemophilus influenzae NOT DETECTED NOT DETECTED Final   Neisseria meningitidis NOT DETECTED NOT DETECTED Final   Pseudomonas aeruginosa NOT DETECTED NOT DETECTED Final   Stenotrophomonas maltophilia NOT DETECTED NOT DETECTED Final   Candida albicans NOT DETECTED NOT DETECTED Final   Candida auris NOT DETECTED NOT DETECTED Final   Candida glabrata NOT DETECTED NOT DETECTED Final   Candida krusei NOT DETECTED NOT DETECTED Final   Candida parapsilosis NOT DETECTED NOT DETECTED Final   Candida tropicalis  NOT DETECTED NOT DETECTED Final   Cryptococcus neoformans/gattii NOT DETECTED NOT DETECTED Final   CTX-M ESBL NOT DETECTED NOT DETECTED Final   Carbapenem resistance IMP NOT DETECTED NOT DETECTED Final   Carbapenem resistance KPC NOT DETECTED NOT DETECTED Final   Carbapenem resistance NDM NOT DETECTED NOT DETECTED Final   Carbapenem resist OXA 48 LIKE NOT DETECTED NOT DETECTED Final   Carbapenem resistance VIM NOT DETECTED NOT DETECTED Final    Comment: Performed at [facility], [address] [place], [place] [address]  Resp Panel by RT-PCR (Flu A&B, Covid) Nasopharyngeal Swab     Status: None   Collection Time: 10/17/20  1:49 PM   Specimen: Nasopharyngeal Swab; Nasopharyngeal(NP) swabs in vial transport medium  Result Value Ref Range Status   SARS Coronavirus 2 by RT PCR NEGATIVE NEGATIVE Final    Comment: (NOTE) SARS-CoV-2 target nucleic acids are NOT DETECTED.  The SARS-CoV-2 RNA is generally detectable in upper respiratory specimens during the acute phase of infection. The lowest concentration of SARS-CoV-2 viral copies this assay can detect is 138 copies/mL. A negative result does not preclude SARS-Cov-2 infection and should not be used as the sole basis for treatment or other patient management decisions. A negative result may occur with  improper specimen collection/handling, submission of specimen other than nasopharyngeal swab, presence of viral mutation(s) within the areas targeted by this assay, and inadequate number of viral copies(<138 copies/mL). A negative result must be combined with clinical observations, patient history, and epidemiological information.  The expected result is Negative.  Fact Sheet for Patients:  EntrepreneurPulse.com.au  Fact Sheet for Healthcare Providers:  IncredibleEmployment.be  This test is no t yet approved or cleared by the [place] FDA and  has been authorized for detection and/or  diagnosis of SARS-CoV-2 by FDA under an Emergency Use Authorization (EUA). This EUA will remain  in effect (meaning this test can be used) for the duration of the COVID-19 declaration under Section 564(b)(1) of the Act, 21 U.S.C.section 360bbb-3(b)(1), unless the authorization is terminated  or revoked sooner.       Influenza A by PCR NEGATIVE NEGATIVE Final   Influenza B by PCR NEGATIVE NEGATIVE Final    Comment: (NOTE) The Xpert Xpress SARS-CoV-2/FLU/RSV plus assay is intended as an aid in the diagnosis of influenza from Nasopharyngeal swab specimens and should not be used as a sole basis for treatment. Nasal washings and aspirates are unacceptable for Xpert Xpress SARS-CoV-2/FLU/RSV testing.  Fact Sheet for Patients: EntrepreneurPulse.com.au  Fact Sheet for Healthcare Providers: IncredibleEmployment.be  This test is not yet approved or cleared by the [place] FDA and has been authorized for detection and/or diagnosis of SARS-CoV-2 by FDA under an Emergency Use Authorization (EUA). This EUA will remain in effect (meaning this test can be used) for the duration of the COVID-19 declaration under Section 564(b)(1) of the Act, 21 U.S.C. section 360bbb-3(b)(1), unless the authorization is terminated or revoked.  Performed at [facility], [address], [place], [place] [address]   Surgical pcr screen     Status: None   Collection Time: 10/21/20 12:06 AM   Specimen: Nasal Mucosa; Nasal Swab  Result Value Ref Range Status   MRSA, PCR NEGATIVE NEGATIVE Final   Staphylococcus aureus NEGATIVE NEGATIVE Final    Comment: (NOTE) The Xpert SA Assay (FDA approved for NASAL specimens in patients 88 years of age and older), is one component of a comprehensive surveillance program. It is not intended to diagnose infection nor to guide or monitor treatment. Performed at [facility], [address] [place], [place] [address]           Radiology Studies: No results found.      Scheduled Meds:  atenolol  12.5 mg Oral BID   cycloSPORINE  1 drop Both Eyes BID   enoxaparin (LOVENOX) injection  60 mg Subcutaneous Q12H   furosemide  40 mg Intravenous Daily   levothyroxine  100 mcg Oral q morning   loratadine  10 mg Oral Daily   methocarbamol  750 mg Oral QID   oxyCODONE-acetaminophen  1 tablet Oral Q6H   pantoprazole  40 mg Oral Daily   predniSONE  5 mg Oral q morning   Warfarin - Pharmacist Dosing Inpatient   Does not apply q1600   Continuous Infusions:  promethazine (PHENERGAN) injection (IM or IVPB) Stopped (10/23/20 1611)     LOS: 8 days     Alma Friendly, MD [facility]  If 7PM-7AM, please contact night-coverage [email or web address] 10/25/2020, 6:16 PM

## 2020-10-25 NOTE — Progress Notes (Signed)
2 Days Post-Op  Subjective: Patient denies any significant abdominal pain.  Has multiple complaints including leg swelling and some shortness of breath.  Denies nausea or vomiting.  Objective: Vital signs in last 24 hours: Temp:  [97.7 F (36.5 C)-98 F (36.7 C)] 98 F (36.7 C) (10/12 0418) Pulse Rate:  [57-60] 60 (10/12 0418) Resp:  [16-18] 16 (10/12 0418) BP: (119-127)/(61-85) 119/61 (10/12 0418) SpO2:  [94 %-96 %] 95 % (10/12 0418) FiO2 (%):  [21 %] 21 % (10/11 1448) Weight:  [59.4 kg] 59.4 kg (10/12 0411) Last BM Date: 10/22/20  Intake/Output from previous day: 10/11 0701 - 10/12 0700 In: 450 [P.O.:450] Out: 2850 [Urine:2850] Intake/Output this shift: No intake/output data recorded.  General appearance: alert, cooperative, and no distress GI: Soft, incisions healing well.  Lab Results:  Recent Labs    10/24/20 0512 10/25/20 0459  WBC 10.8* 8.5  HGB 12.2 12.5  HCT 37.5 38.0  PLT 148* 154   BMET Recent Labs    10/24/20 0512 10/25/20 0459  NA 136 140  K 4.1 3.6  CL 104 105  CO2 27 29  GLUCOSE 123* 75  BUN 12 12  CREATININE 0.65 0.58  CALCIUM 8.0* 8.1*   PT/INR Recent Labs    10/24/20 0512 10/25/20 0459  LABPROT 14.4 16.4*  INR 1.1 1.3*    Studies/Results: No results found.  Anti-infectives: Anti-infectives (From admission, onward)    Start     Dose/Rate Route Frequency Ordered Stop   10/23/20 1400  meropenem (MERREM) 1 g in sodium chloride 0.9 % 100 mL IVPB        1 g 200 mL/hr over 30 Minutes Intravenous Every 12 hours 10/23/20 1310 10/23/20 2148   10/17/20 2200  meropenem (MERREM) 1 g in sodium chloride 0.9 % 100 mL IVPB  Status:  Discontinued        1 g 200 mL/hr over 30 Minutes Intravenous Every 12 hours 10/17/20 1544 10/23/20 1106   10/17/20 1315  meropenem (MERREM) 1 g in sodium chloride 0.9 % 100 mL IVPB        1 g 200 mL/hr over 30 Minutes Intravenous  Once 10/17/20 1307 10/17/20 1518       Assessment/Plan: s/p  Procedure(s): LAPAROSCOPIC CHOLECYSTECTOMY Impression: Postoperative day 2.  Patient being started on Coumadin and her hemoglobin is remaining stable.  LFTs are within normal limits.  Nothing more to add from the surgical standpoint.  We will follow peripherally with you.  Please call us if we can be of further assistance.  She will have a virtual postoperative visit through my office.  LOS: 8 days    [name] Signs 10/25/2020

## 2020-10-25 NOTE — Progress Notes (Signed)
Nutrition Follow up  DOCUMENTATION CODES:   Not applicable  INTERVENTION:  Heart Healthy diet  Offer hydration frequently q shift due to pt dry mouth  Snack between meals as desired  NUTRITION DIAGNOSIS:   Inadequate oral intake related to acute illness (N/V/D prior to admission) as evidenced by per patient/family report.  - progressing with good intake currently  GOAL:  Patient will meet greater than or equal to 90% of their needs  -expected met  MONITOR:  PO intake, Labs, Skin, Weight trends   ASSESSMENT: Patient is a 79 yo female with hx of vitamin D deficiency, atrial fibrillation, NSTEMI, COPD and Sjogren's syndrome (chronic steroids). Presents with Nausea, vomiting and diarrhea. Acute cholangitis with choledocholithiasis.  Patient on a soft foods. Reports ~50% of meals. Able to feed herself. Complaining of dry mouth which she relates to her Sjogren's syndrome. Provided water for her. Patient denies vomiting today. Patient doesn't drink ONS due to allergic to B-12 enhanced products. Patient has numerous (26) allergies listed. Swallow difficulty.  Review of weights 58.3 kg currently. 10/07/20- 53.1 kg and 09/07/20- wt 66 kg. Patient reports weight loss with acute illness.   Medications reviewed and include: Protonix.  Lactated Ringers @ 75 ml/hr  10/6 EGD and ERCP (stone extraction). Esophagus dilated.   10/10 Laparoscopic cholecystectomy.  10/12 pt tolerating diet and last recorded intake 100% lunch today. 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   Labs:  BMP Latest Ref Rng & Units 10/25/2020 10/24/2020 10/23/2020  Glucose 70 - 99 mg/dL 75 123(H) 76  BUN 8 - 23 mg/dL _0 Creatinine 0.44 - 1.00 mg/dL 0.58 0.65 0.68  BUN/Creat Ratio 12 - 28 - - -  Sodium 135 - 145 mmol/L 140 136 135  Potassium 3.5 - 5.1 mmol/L 3.6 4.1 4.1  Chloride 98 - 111 mmol/L 105 104 104  CO2 22 - 32 mmol/L _1 Calcium 8.9 - 10.3 mg/dL 8.1(L) 8.0(L)  7.9(L)     NUTRITION - FOCUSED PHYSICAL EXAM: Nutrition-Focused physical exam completed. Findings are mild upper arm fat depletion, mild temporal muscle depletion, and mild edema.     Diet Order:   Diet Order             Diet Heart Room service appropriate? Yes; Fluid consistency: Thin  Diet effective now                   EDUCATION NEEDS:  Education needs have been addressed  Skin:  Skin Assessment: Skin Integrity Issues: Skin Integrity Issues:: Incisions Incisions: 10/10 bilateral abdomen  Last BM:  10/9  Height:   Ht Readings from Last 1 Encounters:  10/25/20 _2  (1.626 m)    Weight:   Wt Readings from Last 1 Encounters:  10/25/20 59.4 kg    Ideal Body Weight:   55 kg  BMI:  Body mass index is 22.48 kg/m.  Estimated Nutritional Needs:   Kcal:  1500-1600  Protein:  70-75 gr  Fluid:  1600 ml daily   Colman Cater MS,RD,CSG,LDN Contact: Shea Evans

## 2020-10-26 ENCOUNTER — Inpatient Hospital Stay (HOSPITAL_COMMUNITY): Payer: Medicare Other

## 2020-10-26 DIAGNOSIS — K8001 Calculus of gallbladder with acute cholecystitis with obstruction: Secondary | ICD-10-CM | POA: Diagnosis not present

## 2020-10-26 DIAGNOSIS — K8050 Calculus of bile duct without cholangitis or cholecystitis without obstruction: Secondary | ICD-10-CM | POA: Diagnosis not present

## 2020-10-26 LAB — BASIC METABOLIC PANEL
Anion gap: 8 (ref 5–15)
BUN: 14 mg/dL (ref 8–23)
CO2: 31 mmol/L (ref 22–32)
Calcium: 7.8 mg/dL — ABNORMAL LOW (ref 8.9–10.3)
Chloride: 99 mmol/L (ref 98–111)
Creatinine, Ser: 0.68 mg/dL (ref 0.44–1.00)
GFR, Estimated: >60 mL/min (ref >60)
Glucose, Bld: 86 mg/dL (ref 70–99)
Potassium: 3.5 mmol/L (ref 3.5–5.1)
Sodium: 138 mmol/L (ref 135–145)

## 2020-10-26 LAB — TROPONIN I (HIGH SENSITIVITY)
Troponin I (High Sensitivity): 10 ng/L (ref <18)
Troponin I (High Sensitivity): 9 ng/L (ref <18)

## 2020-10-26 LAB — PROTIME-INR
INR: 2.3 — ABNORMAL HIGH (ref 0.8–1.2)
Prothrombin Time: 24.9 seconds — ABNORMAL HIGH (ref 11.4–15.2)

## 2020-10-26 LAB — RESP PANEL BY RT-PCR (FLU A&B, COVID) ARPGX2
Influenza A by PCR: NEGATIVE
Influenza B by PCR: NEGATIVE
SARS Coronavirus 2 by RT PCR: NEGATIVE

## 2020-10-26 MED ORDER — OXYCODONE-ACETAMINOPHEN 7.5-325 MG PO TABS: 1.0000 | ORAL_TABLET | Freq: Four times a day (QID) | ORAL | 0 refills | Status: DC | PRN

## 2020-10-26 MED ORDER — WARFARIN SODIUM 5 MG PO TABS
5.0000 mg | ORAL_TABLET | Freq: Once | ORAL | Status: AC
  Administered 2020-10-26: 5 mg via ORAL
  Filled 2020-10-26: qty 1

## 2020-10-26 MED ORDER — FUROSEMIDE 20 MG PO TABS: 20.0000 mg | ORAL_TABLET | Freq: Every day | ORAL | 0 refills | Status: DC

## 2020-10-26 NOTE — Progress Notes (Signed)
**Note De-Identified  Obfuscation** RT note: RN informed RT that EKG was completed.

## 2020-10-26 NOTE — TOC Transition Note (Signed)
Transition of Care [facility]) - CM/SW Discharge Note   Patient Details  Name: Angela Wong MRN: [id number] Date of Birth: 1940-06-20  Transition of Care [facility]) CM/SW Contact:  Boneta Lucks, RN Phone Number: 10/26/2020, 1:46 PM   Clinical Narrative:  Ebony Hail from [facility] rehab offered, Patient is accepting the bed.  COVID test ordered. RN will call report, TOC printed med necessity and will call EMS with ready. TOC updated son, Corene Cornea.  Corene Cornea did confirm that patient has a 24/7 caregiver 4 days a week and family feels in the rest of the time. He is happy she is agreeable to rehab to get stronger before coming home.      Final next level of care: Skilled Nursing Facility Barriers to Discharge: Barriers Resolved  Patient Goals and CMS Choice Patient states their goals for this hospitalization and ongoing recovery are:: agreeable to SNF CMS Medicare.gov Compare Post Acute Care list provided to:: Patient Choice offered to / list presented to : Patient  Discharge Placement             Patient chooses bed at:  [facility]) Patient to be transferred to facility by: EMS Name of family member notified: Corene Cornea Patient and family notified of of transfer: 10/26/20  Discharge Plan and Services          DME Agency: [employer] Date DME Agency Contacted: 10/25/20 Time DME Agency Contacted: 1000 Representative spoke with at DME Agency: Edie: RN, PT, OT [facility] Agency: [place] Date HH Agency Contacted: 10/25/20 Time [place]: 1000 Representative spoke with at [place]: Romualdo Bolk  Readmission Risk Interventions Readmission Risk Prevention Plan 10/25/2020  Transportation Screening Complete  [place] or Home Care Consult Complete  Social Work Consult for [place] Planning/Counseling Complete  Palliative Care Screening Not Applicable  Medication Review Press photographer) Complete  Some recent data might be hidden

## 2020-10-26 NOTE — Discharge Summary (Addendum)
Discharge Summary  Angela Wong QJJ:[id number] DOB: 26-Jan-1940  PCP: [name], FNP  Admit date: 10/17/2020 Discharge date: 10/28/2020  Time spent: 40 mins  Recommendations for Outpatient Follow-up:  Follow-up with PCP in 1 week Follow-up with general surgery as needed Follow up with Dr Eleonore Chiquito @ 2:20 pm on 10/30/20 at [facility] heart care at [facility] coumadin and check INR on 10/30/20, restart with 2mg  if INR <3   Discharge Diagnoses:  Active Hospital Problems   Diagnosis Date Noted   Pain    Calculus of gallbladder with acute cholecystitis and obstruction    Choledocholithiasis 10/17/2020    Resolved Hospital Problems  No resolved problems to display.    Discharge Condition: Stable  Diet recommendation: Heart healthy  Vitals:   10/27/20 2110 10/28/20 0530  BP: 124/86 118/65  Pulse: 63 (!) 59  Resp: 18 18  Temp: 98 F (36.7 C) 97.9 F (36.6 C)  SpO2: 98% 93%    History of present illness:  Angela Wong is a 80 y.o. female with medical history significant for atrial fibrillation on warfarin, sick sinus syndrome with pacemaker, hypertension, congestive cardiomyopathy, prior NSTEMI with thrombus in distal PLA secondary to subtherapeutic INR, Sjogren's syndrome on chronic prednisone, hypothyroidism, and COPD who presented to the ED with worsening nausea, vomiting, diarrhea that appears to have began over the last few days.  Patient was admitted with acute cholangitis with choledocholithiasis and has undergone ERCP with stone extraction on 10/6 and is now now status post laparoscopic cholecystectomy on 10/10. Pt was bridged with full dose Lovenox while on Coumadin. Pt discharged to SNF for further rehab needs.    Today, patient denies further chest pain, ECHO done showed drop in her EF to about 30-35% from 50% in 2021. Discussed with cardiology Dr Audie Box on 10/27/20, who suggested is most likely non-ischemic, and no inpatient intervention required and  can be d/c to SNF. Advised to continue with lasix and scheduled a close follow up appointment with cardiology @ 2:20 pm on 10/30/20 at [facility] heart care in [place]. Discussed extensively with patient and son about recent findings and plan. Patient agreed to d/c to SNF and follow up with outpatient cardiology appointment.     Hospital Course:  Active Problems:   Choledocholithiasis   Calculus of gallbladder with acute cholecystitis and obstruction   Pain   Acute choledocholithiasis with cholangitis with associated E Coli bacteremia -Status post ERCP with stone extraction on 10/6 and status post laparoscopic cholecystectomy 10/10 -Follow up with surgery as needed   Transaminitis secondary to above          Resolving  Acute systolic HF Reports chest pain radiating to back on 10/26/20 Trop X 2 neg EKG with paced rhythm, noted PVCs, prolonged Qtc CXR with bilateral atelectasis ECHO with EF 30-35%, LV with global hypokinesis, reduced from 50% in 2021 Discussed with cardiology Dr Audie Box on 10/27/20, who suggested is most likely non-ischemic, and no inpatient intervention required and can be d/c to SNF. Advised to continue with lasix and scheduled a close follow up appointment with cardiology @ 2:20 pm on 10/30/20 at [facility] heart care in [place]. Avoid Qtc prolonging meds Continue lasix Due to multiple med allergic including coreg, lisinopril, none can be started until pt sees cardiology where further trials of HF meds can be pursued    Spinal stenosis -Ongoing low back pain         -Percocet prn and Muscle relaxer scheduled   History of  paroxysmal atrial fibrillation -HR controlled -Continue atenolol -INR on 10/28/20 is 3.8 (supratheraputic). Hold coumadin and recheck INR on 10/30/20, restart with 2mg  if INR <3   History of COPD -No acute bronchospasms -Breathing treatments as needed   History of hypothyroidism -Continue levothyroxine   History of Sjogren's syndrome -Continue  on prednisone and Restasis      Malnutrition Type:  Nutrition Problem: Inadequate oral intake Etiology: acute illness (N/V/D prior to admission)   Malnutrition Characteristics:  Signs/Symptoms: per patient/family report   Nutrition Interventions:      Estimated body mass index is 22.48 kg/m as calculated from the following:   Height as of this encounter: 5\' 4"  (1.626 m).   Weight as of this encounter: 59.4 kg.    Procedures: ERCP Lap Chole  Consultations: GI Gen surgery Discussed with cardiology on 10/27/20   Discharge Exam: BP 118/65 (BP Location: Right Arm)   Pulse (!) 59   Temp 97.9 F (36.6 C) (Oral)   Resp 18   Ht 5\' 4"  (1.626 m)   Wt 59.4 kg   SpO2 93%   BMI 22.48 kg/m   General: NAD  Cardiovascular: S1, S2 present Respiratory: CTAB Abdomen: Soft, nontender, nondistended, bowel sounds present, incision c/d/i Musculoskeletal: 1+ bilateral pedal edema noted- mainly around bilateral thigh Skin: Normal Psychiatry: Normal mood   Discharge Instructions You were cared for by a hospitalist during your hospital stay. If you have any questions about your discharge medications or the care you received while you were in the hospital after you are discharged, you can call the unit and asked to speak with the hospitalist on call if the hospitalist that took care of you is not available. Once you are discharged, your primary care physician will handle any further medical issues. Please note that NO REFILLS for any discharge medications will be authorized once you are discharged, as it is imperative that you return to your primary care physician (or establish a relationship with a primary care physician if you do not have one) for your aftercare needs so that they can reassess your need for medications and monitor your lab values.   Allergies as of 10/28/2020       Reactions   Cephalosporins Hives, Shortness Of Breath   Ciprofloxacin Hives, Shortness Of Breath    Diltiazem Shortness Of Breath   Swollen throat   Doxycycline Hives, Shortness Of Breath   Horse-derived Products Anaphylaxis   Ketek [telithromycin] Palpitations   Chest discomfort   Nitrofuran Derivatives Anaphylaxis, Hives   blisters   Nitrous Oxide Nausea And Vomiting   Severe due to Sjogrens (Auto-Immune Disease)   Other Anaphylaxis   ALLERGY TO HORSE SERUM   Penicillins Hives, Shortness Of Breath      Pentazocine Other (See Comments)   Other reaction(s): Mental Status Changes (intolerance) Altered Mental Status  Altered Mental Status  Altered Mental Status    Sulfa Antibiotics Hives, Shortness Of Breath   Trovan [alatrofloxacin] Palpitations, Other (See Comments), Anaphylaxis   Chest pain, dizziness, irregular pulse   Vitamin B12    Confusion and shaking   Zinc Gelatin [zinc] Anaphylaxis   Amlodipine Swelling   Calcium Channel Blockers    Respiratory distress   Carvedilol Other (See Comments)   Dizziness, "joint pain, depression"   Clindamycin/lincomycin    CP, lock jaw   Codeine Nausea Only   Cymbalta [duloxetine Hcl] Swelling   Diovan [valsartan] Swelling   Lisinopril Swelling   Sertraline Other (See Comments)   confusion  Tramadol Nausea Only   Loteprednol Etabonate Rash        Medication List     STOP taking these medications    azithromycin 250 MG tablet Commonly known as: Zithromax Z-Pak       TAKE these medications    albuterol 108 (90 Base) MCG/ACT inhaler Commonly known as: VENTOLIN HFA INHALE 2 PUFFS BY MOUTH EVERY 6 HOURS AS NEEDED FOR WHEEZING What changed:  how much to take how to take this when to take this reasons to take this additional instructions   atenolol 25 MG tablet Commonly known as: TENORMIN Take 0.5 tablets (12.5 mg total) by mouth 2 (two) times daily.   fexofenadine 180 MG tablet Commonly known as: ALLEGRA Take 180 mg by mouth every morning.   furosemide 40 MG tablet Commonly known as: Lasix Take 1 tablet  (40 mg total) by mouth daily.   hydrocortisone-pramoxine 2.5-1 % rectal cream Commonly known as: ANALPRAM-HC Place 1 application rectally See admin instructions. After each bathroom use   levothyroxine 50 MCG tablet Commonly known as: Euthyrox Take 2 tablets (100 mcg total) by mouth every morning.   methocarbamol 750 MG tablet Commonly known as: ROBAXIN Take 750 mg by mouth 4 (four) times daily.   oxyCODONE-acetaminophen 7.5-325 MG tablet Commonly known as: PERCOCET Take 1 tablet by mouth every 6 (six) hours as needed for severe pain.   predniSONE 5 MG tablet Commonly known as: DELTASONE Take 1 tablet (5 mg total) by mouth every morning.   Restasis 0.05 % ophthalmic emulsion Generic drug: cycloSPORINE INSTILL 1 DROP INTO EACH EYE TWICE DAILY What changed: See the new instructions.   warfarin 1 MG tablet Commonly known as: COUMADIN Take as directed. If you are unsure how to take this medication, talk to your nurse or doctor. Original instructions: TAKE 4MG  MONDAY, WEDNESDAY, AND FRIDAY. TAKE 2 MG TUESDAY, THURSDAY, SATURDAY, AND SUNDAY Start taking on: October 30, 2020 What changed: These instructions start on October 30, 2020. If you are unsure what to do until then, ask your doctor or other care provider.               Durable Medical Equipment  (From admission, onward)           Start     Ordered   10/24/20 1150  For home use only DME Hospital bed  Once       Question Answer Comment  Length of Need Lifetime   The above medical condition requires: Patient requires the ability to reposition frequently   Bed type Semi-electric      10 /11/22 1149           Allergies  Allergen Reactions   Cephalosporins Hives and Shortness Of Breath   Ciprofloxacin Hives and Shortness Of Breath   Diltiazem Shortness Of Breath    Swollen throat    Doxycycline Hives and Shortness Of Breath   Horse-Derived Products Anaphylaxis   Ketek [Telithromycin] Palpitations     Chest discomfort   Nitrofuran Derivatives Anaphylaxis and Hives    blisters   Nitrous Oxide Nausea And Vomiting    Severe due to Sjogrens (Auto-Immune Disease)   Other Anaphylaxis    ALLERGY TO HORSE SERUM   Penicillins Hives and Shortness Of Breath        Pentazocine Other (See Comments)    Other reaction(s): Mental Status Changes (intolerance) Altered Mental Status  Altered Mental Status  Altered Mental Status    Sulfa Antibiotics Hives and Shortness Of Breath  Trovan [Alatrofloxacin] Palpitations, Other (See Comments) and Anaphylaxis    Chest pain, dizziness, irregular pulse   Vitamin B12     Confusion and shaking    Zinc Gelatin [Zinc] Anaphylaxis   Amlodipine Swelling   Calcium Channel Blockers     Respiratory distress   Carvedilol Other (See Comments)    Dizziness, "joint pain, depression"   Clindamycin/Lincomycin     CP, lock jaw   Codeine Nausea Only   Cymbalta [Duloxetine Hcl] Swelling   Diovan [Valsartan] Swelling   Lisinopril Swelling   Sertraline Other (See Comments)    confusion   Tramadol Nausea Only   Loteprednol Etabonate Rash    Contact information for follow-up providers     [name] Signs, MD Follow up.   Specialty: General Surgery Why: As needed. Contact information: [address] [address] [phone]         [facility] Follow up.   Why: Hospital bed        [facility] Follow up.   Specialty: [place] Why: PT/OT/RN will call to set up your first home visit.        [name], FNP. Schedule an appointment as soon as possible for a visit in 1 week(s).   Specialty: Family Medicine Contact information: [place] [address] [phone]         Minus Breeding, MD .   Specialty: Cardiology Contact information: [address] [place] [address] [phone]              Contact information for after-discharge care      [facility] .   Service: Skilled Nursing Contact information: [address] [phone]                      The results of significant diagnostics from this hospitalization (including imaging, microbiology, ancillary and laboratory) are listed below for reference.    Significant Diagnostic Studies: CT ABDOMEN PELVIS W CONTRAST  Result Date: 10/17/2020 CLINICAL DATA:  An 80 year old female presents with abdominal pain nausea and diarrhea that began yesterday. EXAM: CT ABDOMEN AND PELVIS WITH CONTRAST TECHNIQUE: Multidetector CT imaging of the abdomen and pelvis was performed using the standard protocol following bolus administration of intravenous contrast. CONTRAST:  76mL OMNIPAQUE IOHEXOL 300 MG/ML  SOLN COMPARISON:  Comparison made with July 01, 2019. FINDINGS: Lower chest: Cardiomegaly. Pacer device in place, single lead in the RIGHT heart, heart is incompletely imaged. No pericardial effusion. Small RIGHT greater than LEFT pleural effusions. Basilar atelectasis. Hepatobiliary: Marked heterogeneity of hepatic parenchyma with "nutmeg" pattern portal vein is patent into the liver. Hepatic veins are grossly patent. Small cyst along the RIGHT hepatic margin (image 14/2) Increased biliary duct distension with 13 mm dilation of the common bile duct that is new compared to previous imaging. Mild prominence of the main pancreatic duct in the head of the pancreas is similar to the prior study. Small high-density focus at the level of the ampulla (image 27/5) this measures approximately 5 mm. The biliary tree tapers to this level and there is some peribiliary enhancement. This is diffuse throughout the dilated extrahepatic biliary tree. Gallbladder is distended mildly with some small volume pericholecystic fluid. Pancreas: Pancreatic atrophy with mild prominence of the main pancreatic duct in the  head of the pancreas which is unchanged, no signs of pancreatic inflammation  currently. Spleen: Normal size and contour. Adrenals/Urinary Tract: Adrenal glands are hyperenhancing. Ptotic RIGHT kidney with small cyst in the "lower pole", transverse lie of the RIGHT kidney with similar appearance. No hydronephrosis. Small cyst in the lower pole the LEFT kidney. Also without hydronephrosis or perinephric stranding on the LEFT. Urinary bladder is collapsed limiting assessment. Stomach/Bowel: No sign of acute gastrointestinal finding. Generalized bowel edema in the setting of mild anasarca. Appendix not visible. Post appendectomy based on history in the medical record. Sigmoid diverticular disease and diverticular changes along the descending colon. Vascular/Lymphatic: Aortic atherosclerosis. No sign of aneurysm. Smooth contour of the IVC. There is no gastrohepatic or hepatoduodenal ligament lymphadenopathy. No retroperitoneal or mesenteric lymphadenopathy. No pelvic sidewall lymphadenopathy. Reproductive: Post hysterectomy.  No adnexal mass. Other: Small volume ascites in the pelvis.  Diffuse body wall edema. Musculoskeletal: No acute bone finding. No destructive bone process. Spinal degenerative changes. Degenerative changes are present in the hips bilaterally similar to prior imaging. These are severe. IMPRESSION: Increased biliary duct distension with some peribiliary enhancement. Small high-density focus at the level of the ampulla this measures approximately 5 mm. Findings raise the question of choledocholithiasis and or small obstructing lesion at the ampulla with resultant cholangitis. Sagittal images show more abrupt termination of the common bile duct than do coronal images (image 62/6) MRI/MRCP may be helpful as possible for further evaluation with HIDA scan as an initial means of further evaluating this patient if MRI is not possible. Ultimately ERCP may be warranted for further evaluation. Pericholecystic  fluid, nonspecific in the setting of anasarca with only mild distension of the gallbladder. HIDA scan could be helpful to add specificity to the above findings if there is concern for acute cholecystitis. Hepatic enhancement pattern which is more compatible with congestive hepatopathy. Correlate with laboratory evidence of heart failure as warranted. Anasarca. Sigmoid diverticular disease and diverticular changes along the descending colon. Aortic Atherosclerosis (ICD10-I70.0). Electronically Signed   By: Zetta Bills M.D.   On: 10/17/2020 10:36   DG Chest Port 1 View  Result Date: 10/26/2020 CLINICAL DATA:  Chest pain EXAM: PORTABLE CHEST 1 VIEW COMPARISON:  09/07/2020, 10/17/2020 FINDINGS: Single frontal view of the chest demonstrates stable cardiac silhouette. Single lead pacer unchanged. There are bibasilar veiling opacities consistent with effusions and consolidation, new since prior study. No pneumothorax. IMPRESSION: 1. Bilateral pleural effusions and bibasilar consolidation, likely atelectasis. 2. Stable enlarged cardiac silhouette. Electronically Signed   By: Randa Ngo M.D.   On: 10/26/2020 15:34   DG ERCP  Result Date: 10/19/2020 CLINICAL DATA:  ERCP EXAM: ERCP TECHNIQUE: Multiple spot images obtained with the fluoroscopic device and submitted for interpretation post-procedure. FLUOROSCOPY TIME:  Fluoroscopy Time:  2 minutes 53 seconds Radiation Exposure Index (if provided by the fluoroscopic device): 70 mGy Number of Acquired Spot Images: 10 fluoroscopic loops COMPARISON:  None. FINDINGS: A total of 10 fluoroscopic loops are submitted for review. Images were taken during ERCP procedure. Images demonstrate a scope overlying the mid abdomen. The CBD appears to be catheterized. Subsequent loops demonstrate gradual opacification of the common bile duct. Eventually, there is reflux of contrast material via the cystic duct into the gallbladder. Final images demonstrate balloon sweep of the CBD.  IMPRESSION: Fluoroscopic films submitted during ERCP as described. Refer to procedure report for full details. These images were submitted for radiologic interpretation only. Please see the procedural report for the amount of contrast and the fluoroscopy time utilized. Electronically Signed   By: Scherrie Bateman.D.  On: 10/19/2020 12:15   ECHOCARDIOGRAM COMPLETE  Result Date: 10/27/2020    ECHOCARDIOGRAM REPORT   Patient Name:   TAVIE HASEMAN Date of Exam: 10/27/2020 Medical Rec #:  [id number]       Height:       64.0 in Accession #:    [id number]      Weight:       131.0 lb Date of Birth:  Jan 13, 1941      BSA:          1.634 m Patient Age:    38 years        BP:           126/75 mmHg Patient Gender: F               HR:           58 bpm. Exam Location:  [facility] Procedure: 2D Echo, Cardiac Doppler and Color Doppler Indications:    Chest Pain  History:        Patient has prior history of Echocardiogram examinations, most                 recent 06/23/2019. CHF, Previous Myocardial Infarction and CAD,                 Pacemaker, Arrythmias:Atrial Fibrillation,                 Signs/Symptoms:Dyspnea; Risk Factors:Hypertension and                 Dyslipidemia. Raunaud's Disease, MVP, Dilated Cardiomyopathy,                 Ovarian CA.  Sonographer:    Wenda Low Referring Phys: [id number] [place]  1. Left ventricular ejection fraction, by estimation, is 30 to 35%. Left ventricular ejection fraction by 2D MOD biplane is 31.7 %. The left ventricle has moderately decreased function. The left ventricle demonstrates global hypokinesis. Left ventricular diastolic function could not be evaluated.  2. Right ventricular systolic function is moderately reduced. The right ventricular size is mildly enlarged. There is mildly elevated pulmonary artery systolic pressure. The estimated right ventricular systolic pressure is 65.0 mmHg.  3. Left atrial size was mildly dilated.  4. Right atrial size was  mild to moderately dilated.  5. The mitral valve is grossly normal. Mild mitral valve regurgitation. No evidence of mitral stenosis.  6. Tricuspid valve regurgitation is moderate.  7. The aortic valve is tricuspid. Aortic valve regurgitation is not visualized. No aortic stenosis is present.  8. The inferior vena cava is normal in size with greater than 50% respiratory variability, suggesting right atrial pressure of 3 mmHg. Comparison(s): Changes from prior study are noted. The left ventricular function is worsened. FINDINGS  Left Ventricle: Left ventricular ejection fraction, by estimation, is 30 to 35%. Left ventricular ejection fraction by 2D MOD biplane is 31.7 %. The left ventricle has moderately decreased function. The left ventricle demonstrates global hypokinesis. The left ventricular internal cavity size was normal in size. There is no left ventricular hypertrophy. Left ventricular diastolic function could not be evaluated due to atrial fibrillation. Left ventricular diastolic function could not be evaluated. Right Ventricle: The right ventricular size is mildly enlarged. No increase in right ventricular wall thickness. Right ventricular systolic function is moderately reduced. There is mildly elevated pulmonary artery systolic pressure. The tricuspid regurgitant velocity is 2.89 m/s, and with an assumed right atrial pressure of 3 mmHg, the estimated right  ventricular systolic pressure is 55.7 mmHg. Left Atrium: Left atrial size was mildly dilated. Right Atrium: Right atrial size was mild to moderately dilated. Pericardium: There is no evidence of pericardial effusion. Mitral Valve: The mitral valve is grossly normal. Mild mitral valve regurgitation. No evidence of mitral valve stenosis. MV peak gradient, 4.5 mmHg. The mean mitral valve gradient is 1.0 mmHg. Tricuspid Valve: The tricuspid valve is grossly normal. Tricuspid valve regurgitation is moderate . No evidence of tricuspid stenosis. Aortic Valve:  The aortic valve is tricuspid. Aortic valve regurgitation is not visualized. No aortic stenosis is present. Aortic valve mean gradient measures 3.0 mmHg. Aortic valve peak gradient measures 4.9 mmHg. Aortic valve area, by VTI measures 1.50 cm. Pulmonic Valve: The pulmonic valve was grossly normal. Pulmonic valve regurgitation is trivial. No evidence of pulmonic stenosis. Aorta: The aortic root is normal in size and structure. Venous: The inferior vena cava is normal in size with greater than 50% respiratory variability, suggesting right atrial pressure of 3 mmHg. IAS/Shunts: The atrial septum is grossly normal. Additional Comments: A device lead is visualized in the right atrium and right ventricle. There is a small pleural effusion in both left and right lateral regions.  LEFT VENTRICLE PLAX 2D                        Biplane EF (MOD) LVIDd:         5.30 cm         LV Biplane EF:   Left LVIDs:         4.10 cm                          ventricular LV PW:         1.00 cm                          ejection LV IVS:        1.00 cm                          fraction by LVOT diam:     1.70 cm                          2D MOD LV SV:         34                               biplane is LV SV Index:   21                               31.7 %. LVOT Area:     2.27 cm  LV Volumes (MOD) LV vol d, MOD    110.0 ml A2C: LV vol d, MOD    80.4 ml A4C: LV vol s, MOD    75.9 ml A2C: LV vol s, MOD    54.6 ml A4C: LV SV MOD A2C:   34.1 ml LV SV MOD A4C:   80.4 ml LV SV MOD BP:    31.7 ml RIGHT VENTRICLE RV Basal diam:  3.70 cm RV Mid diam:    3.00 cm RV S prime:     8.38 cm/s TAPSE (M-mode): 2.0 cm LEFT ATRIUM  Index        RIGHT ATRIUM           Index LA diam:      4.60 cm 2.81 cm/m   RA Area:     22.50 cm LA Vol (A2C): 80.9 ml 49.51 ml/m  RA Volume:   71.10 ml  43.51 ml/m LA Vol (A4C): 59.4 ml 36.35 ml/m  AORTIC VALVE                    PULMONIC VALVE AV Area (Vmax):    1.74 cm     PV Vmax:       0.63 m/s AV Area (Vmean):    1.64 cm     PV Peak grad:  1.6 mmHg AV Area (VTI):     1.50 cm AV Vmax:           111.00 cm/s AV Vmean:          73.800 cm/s AV VTI:            0.226 m AV Peak Grad:      4.9 mmHg AV Mean Grad:      3.0 mmHg LVOT Vmax:         84.90 cm/s LVOT Vmean:        53.300 cm/s LVOT VTI:          0.149 m LVOT/AV VTI ratio: 0.66  AORTA Ao Root diam: 2.90 cm MITRAL VALVE               TRICUSPID VALVE MV Area (PHT): 4.36 cm    TR Peak grad:   33.4 mmHg MV Area VTI:   1.02 cm    TR Vmax:        289.00 cm/s MV Peak grad:  4.5 mmHg MV Mean grad:  1.0 mmHg    SHUNTS MV Vmax:       1.06 m/s    Systemic VTI:  0.15 m MV Vmean:      44.6 cm/s   Systemic Diam: 1.70 cm MV Decel Time: 174 msec MV E velocity: 86.10 cm/s Eleonore Chiquito MD Electronically signed by Eleonore Chiquito MD Signature Date/Time: 10/27/2020/3:56:21 PM    Final     Microbiology: Recent Results (from the past 240 hour(s))  Surgical pcr screen     Status: None   Collection Time: 10/21/20 12:06 AM   Specimen: Nasal Mucosa; Nasal Swab  Result Value Ref Range Status   MRSA, PCR NEGATIVE NEGATIVE Final   Staphylococcus aureus NEGATIVE NEGATIVE Final    Comment: (NOTE) The Xpert SA Assay (FDA approved for NASAL specimens in patients 64 years of age and older), is one component of a comprehensive surveillance program. It is not intended to diagnose infection nor to guide or monitor treatment. Performed at [facility], [address] [place], [place] [address]   Resp Panel by RT-PCR (Flu A&B, Covid) Nasopharyngeal Swab     Status: None   Collection Time: 10/26/20  2:42 PM   Specimen: Nasopharyngeal Swab; Nasopharyngeal(NP) swabs in vial transport medium  Result Value Ref Range Status   SARS Coronavirus 2 by RT PCR NEGATIVE NEGATIVE Final    Comment: (NOTE) SARS-CoV-2 target nucleic acids are NOT DETECTED.  The SARS-CoV-2 RNA is generally detectable in upper respiratory specimens during the acute phase of infection. The lowest concentration of  SARS-CoV-2 viral copies this assay can detect is 138 copies/mL. A negative result does not preclude SARS-Cov-2 infection and should not be used as the  sole basis for treatment or other patient management decisions. A negative result may occur with  improper specimen collection/handling, submission of specimen other than nasopharyngeal swab, presence of viral mutation(s) within the areas targeted by this assay, and inadequate number of viral copies(<138 copies/mL). A negative result must be combined with clinical observations, patient history, and epidemiological information. The expected result is Negative.  Fact Sheet for Patients:  EntrepreneurPulse.com.au  Fact Sheet for Healthcare Providers:  IncredibleEmployment.be  This test is no t yet approved or cleared by the [place] FDA and  has been authorized for detection and/or diagnosis of SARS-CoV-2 by FDA under an Emergency Use Authorization (EUA). This EUA will remain  in effect (meaning this test can be used) for the duration of the COVID-19 declaration under Section 564(b)(1) of the Act, 21 U.S.C.section 360bbb-3(b)(1), unless the authorization is terminated  or revoked sooner.       Influenza A by PCR NEGATIVE NEGATIVE Final   Influenza B by PCR NEGATIVE NEGATIVE Final    Comment: (NOTE) The Xpert Xpress SARS-CoV-2/FLU/RSV plus assay is intended as an aid in the diagnosis of influenza from Nasopharyngeal swab specimens and should not be used as a sole basis for treatment. Nasal washings and aspirates are unacceptable for Xpert Xpress SARS-CoV-2/FLU/RSV testing.  Fact Sheet for Patients: EntrepreneurPulse.com.au  Fact Sheet for Healthcare Providers: IncredibleEmployment.be  This test is not yet approved or cleared by the [place] FDA and has been authorized for detection and/or diagnosis of SARS-CoV-2 by FDA under an Emergency Use  Authorization (EUA). This EUA will remain in effect (meaning this test can be used) for the duration of the COVID-19 declaration under Section 564(b)(1) of the Act, 21 U.S.C. section 360bbb-3(b)(1), unless the authorization is terminated or revoked.  Performed at [facility], [address] [place], [place] [address]      Labs: Basic Metabolic Panel: Recent Labs  Lab 10/22/20 [phone] 10/23/20 0431 10/24/20 0512 10/25/20 0459 10/26/20 0527 10/27/20 0521 10/28/20 0424  NA 136 135 136 140 138 140 139  K 4.0 4.1 4.1 3.6 3.5 3.6 3.6  CL 107 104 104 105 99 98 99  CO2 25 26 27 29 31  35* 34*  GLUCOSE 81 76 123* 75 86 79 73  BUN 14 13 12 12 14 13 15   CREATININE 0.70 0.68 0.65 0.58 0.68 0.65 0.60  CALCIUM 8.4* 7.9* 8.0* 8.1* 7.8* 8.2* 8.0*  MG 1.7 1.7 1.6* 1.8  --  1.8  --    Liver Function Tests: Recent Labs  Lab 10/22/20 0639 10/23/20 0431 10/24/20 0512 10/25/20 0459  AST 18 15 21  40  ALT 59* 45* 37 39  ALKPHOS 213* 174* 169* 163*  BILITOT 1.5* 1.1 0.8 1.0  PROT 5.1* 4.6* 4.5* 4.5*  ALBUMIN 2.8* 2.6* 2.5* 2.5*   No results for input(s): LIPASE, AMYLASE in the last 168 hours.  No results for input(s): AMMONIA in the last 168 hours. CBC: Recent Labs  Lab 10/22/20 0639 10/23/20 0431 10/24/20 0512 10/25/20 0459  WBC 7.1 6.0 10.8* 8.5  HGB 13.2 12.2 12.2 12.5  HCT 40.7 37.7 37.5 38.0  MCV 99.0 100.3* 99.7 100.3*  PLT 129* 145* 148* 154   Cardiac Enzymes: No results for input(s): CKTOTAL, CKMB, CKMBINDEX, TROPONINI in the last 168 hours. BNP: BNP (last 3 results) No results for input(s): BNP in the last 8760 hours.  ProBNP (last 3 results) No results for input(s): PROBNP in the last 8760 hours.  CBG: No results for input(s): GLUCAP in  the last 168 hours.     Signed:  Alma Friendly, MD [facility] 10/28/2020, 7:55 AM

## 2020-10-26 NOTE — Progress Notes (Signed)
PROGRESS NOTE    Angela Wong  YTK:[id number] DOB: Jul 29, 1940 DOA: 10/17/2020 PCP: [name], FNP   Brief Narrative:   Angela Wong is a 80 y.o. female with medical history significant for atrial fibrillation on warfarin, sick sinus syndrome with pacemaker, hypertension, congestive cardiomyopathy, prior NSTEMI with thrombus in distal PLA secondary to subtherapeutic INR, Sjogren's syndrome on chronic prednisone, hypothyroidism, and COPD who presented to the ED with worsening nausea, vomiting, diarrhea that appears to have began over the last few days.  Patient was admitted with acute cholangitis with choledocholithiasis and has undergone ERCP with stone extraction on 10/6 and is now now status post laparoscopic cholecystectomy on 10/10.  She is now on bridging therapy with full dose Lovenox as she becomes therapeutic in her Coumadin levels.  She will need INR greater than 2 on discharge since she has had prior NSTEMI due to thrombus with subtherapeutic INR.  Assessment & Plan:   Active Problems:   Choledocholithiasis   Calculus of gallbladder with acute cholecystitis and obstruction   Pain   Acute choledocholithiasis with cholangitis with associated E Coli bacteremia -Status post ERCP with stone extraction on 10/6 and status post laparoscopic cholecystectomy 10/10 -Resumed diet per general surgery -Symptomatic management  Chest pain Reports chest pain radiating to back on 10/26/20 shortly before d/c Initial trop neg, repeat pending EKG with paced rhythm, noted PVCs, prolonged Qtc CXR with bilateral atelectasis Avoid Qtc prolonging meds, repeat bmp and Mg in am Continue telemetry   Transaminitis secondary to above          Resolving   Spinal stenosis -Ongoing low back pain with percocet scheduled -Muscle relaxers now scheduled -PT evaluation recommending SNF -Order for hospital bed placed  BLE dependent edema         -Likely due to IVF during this admission          -Continue IV lasix         -Daily BMP   History of paroxysmal atrial fibrillation -Resumed Lovenox on 10/11 AM along with warfarin for bridging -Monitor on telemetry -Continue atenolol -Currently rate controlled   History of COPD -No acute bronchospasms -Breathing treatments as needed   History of hypothyroidism -Continue levothyroxine   History of Sjogren's syndrome -Continue on prednisone and Restasis     DVT prophylaxis: Full dose Lovenox for bridging while on warfarin started 10/11 Code Status: Full Family Communication: None at bedside Disposition Plan:  Status is: Inpatient   Remains inpatient appropriate because:Ongoing diagnostic testing needed not appropriate for outpatient work up, IV treatments appropriate due to intensity of illness or inability to take PO, and Inpatient level of care appropriate due to severity of illness   Dispo: The patient is from: Home              Anticipated d/c is to: SNF              Patient currently is not medically stable to d/c.              Difficult to place patient No     Skin Assessment:   I have examined the patient's skin and I agree with the wound assessment as performed by the wound care RN as outlined below:   Pressure Injury 06/22/19 Sacrum Left Stage 2 -  Partial thickness loss of dermis presenting as a shallow open injury with a red, pink wound bed without slough. (Active)  06/22/19 2102  Location: Sacrum  Location Orientation: Left  Staging: Stage 2 -  Partial thickness loss of dermis presenting as a shallow open injury with a red, pink wound bed without slough.  Wound Description (Comments):   Present on Admission: Yes      Consultants:  GI General Surgery   Procedures:  ERCP with stone extraction 10/6 Laparoscopic cholecystectomy 10/10  Antimicrobials:  Anti-infectives (From admission, onward)    Start     Dose/Rate Route Frequency Ordered Stop   10/23/20 1400  meropenem (MERREM) 1 g in sodium  chloride 0.9 % 100 mL IVPB        1 g 200 mL/hr over 30 Minutes Intravenous Every 12 hours 10/23/20 1310 10/23/20 2148   10/17/20 2200  meropenem (MERREM) 1 g in sodium chloride 0.9 % 100 mL IVPB  Status:  Discontinued        1 g 200 mL/hr over 30 Minutes Intravenous Every 12 hours 10/17/20 1544 10/23/20 1106   10/17/20 1315  meropenem (MERREM) 1 g in sodium chloride 0.9 % 100 mL IVPB        1 g 200 mL/hr over 30 Minutes Intravenous  Once 10/17/20 1307 10/17/20 1518       Subjective: Reports chest pain radiating to back on 10/26/20 shortly before d/c this evening. Work up in progress. Denies any SOB, abdominal pain, N/V, fever/chills  Objective: Vitals:   10/26/20 0500 10/26/20 0903 10/26/20 1008 10/26/20 1155  BP: 118/61 136/84 136/77 123/68  Pulse: 60 66 99 67  Resp: 18 17  17   Temp: 98.4 F (36.9 C) 97.7 F (36.5 C)  98.1 F (36.7 C)  TempSrc: Oral Oral  Oral  SpO2: 97% 96%  97%  Weight:      Height:        Intake/Output Summary (Last 24 hours) at 10/26/2020 1648 Last data filed at 10/26/2020 1000 Gross per 24 hour  Intake 300 ml  Output 2000 ml  Net -1700 ml   Filed Weights   10/17/20 0755 10/18/20 0318 10/25/20 0411  Weight: 49.4 kg 58.3 kg 59.4 kg    Examination: General: NAD  Cardiovascular: S1, S2 present Respiratory: CTAB Abdomen: Soft, nontender, nondistended, bowel sounds present, noted incisions c/d/i Musculoskeletal: 1+ bilateral pedal edema noted Skin: Sacral ulcer noted above Psychiatry: Fair mood      Data Reviewed: I have personally reviewed following labs and imaging studies  CBC: Recent Labs  Lab 10/21/20 0242 10/22/20 0639 10/23/20 0431 10/24/20 0512 10/25/20 0459  WBC 6.6 7.1 6.0 10.8* 8.5  HGB 12.2 13.2 12.2 12.2 12.5  HCT 37.9 40.7 37.7 37.5 38.0  MCV 98.2 99.0 100.3* 99.7 100.3*  PLT 109* 129* 145* 148* 347   Basic Metabolic Panel: Recent Labs  Lab 10/21/20 0242 10/22/20 0639 10/23/20 0431 10/24/20 0512  10/25/20 0459 10/26/20 0527  NA 133* 136 135 136 140 138  K 4.0 4.0 4.1 4.1 3.6 3.5  CL 104 107 104 104 105 99  CO2 25 25 26 27 29 31   GLUCOSE 86 81 76 123* 75 86  BUN 13 14 13 12 12 14   CREATININE 0.61 0.70 0.68 0.65 0.58 0.68  CALCIUM 8.1* 8.4* 7.9* 8.0* 8.1* 7.8*  MG 1.9 1.7 1.7 1.6* 1.8  --    GFR: Estimated Creatinine Clearance: 47.6 mL/min (by C-G formula based on SCr of 0.68 mg/dL). Liver Function Tests: Recent Labs  Lab 10/21/20 0242 10/22/20 0639 10/23/20 0431 10/24/20 0512 10/25/20 0459  AST 28 18 15 21  40  ALT 74* 59* 45* 37 39  ALKPHOS 224* 213* 174* 169* 163*  BILITOT 2.1* 1.5* 1.1 0.8 1.0  PROT 4.4* 5.1* 4.6* 4.5* 4.5*  ALBUMIN 2.5* 2.8* 2.6* 2.5* 2.5*   Recent Labs  Lab 10/20/20 0525  AMYLASE 43   No results for input(s): AMMONIA in the last 168 hours. Coagulation Profile: Recent Labs  Lab 10/20/20 0525 10/24/20 0512 10/25/20 0459 10/26/20 0527  INR 1.2 1.1 1.3* 2.3*   Cardiac Enzymes: No results for input(s): CKTOTAL, CKMB, CKMBINDEX, TROPONINI in the last 168 hours. BNP (last 3 results) No results for input(s): PROBNP in the last 8760 hours. HbA1C: No results for input(s): HGBA1C in the last 72 hours. CBG: No results for input(s): GLUCAP in the last 168 hours. Lipid Profile: No results for input(s): CHOL, HDL, LDLCALC, TRIG, CHOLHDL, LDLDIRECT in the last 72 hours. Thyroid Function Tests: No results for input(s): TSH, T4TOTAL, FREET4, T3FREE, THYROIDAB in the last 72 hours. Anemia Panel: No results for input(s): VITAMINB12, FOLATE, FERRITIN, TIBC, IRON, RETICCTPCT in the last 72 hours. Sepsis Labs: No results for input(s): PROCALCITON, LATICACIDVEN in the last 168 hours.   Recent Results (from the past 240 hour(s))  Blood culture (routine x 2)     Status: Abnormal   Collection Time: 10/17/20  1:17 PM   Specimen: BLOOD RIGHT HAND  Result Value Ref Range Status   Specimen Description   Final    BLOOD RIGHT HAND Performed at [facility], [address], [place], [place] [address]    Special Requests   Final    Blood Culture results may not be optimal due to an excessive volume of blood received in culture bottles BOTTLES DRAWN AEROBIC AND ANAEROBIC Performed at [facility], [address], [place], [place] [address]    Culture  Setup Time   Final    GRAM NEGATIVE RODS IN BOTH AEROBIC AND ANAEROBIC BOTTLES Gram Stain Report Called to,Read Back By and Verified With: MORRIS,B AT 0945 ON 10.6.22 BY RUCINSKI,B Organism ID to follow CRITICAL RESULT CALLED TO, READ BACK BY AND VERIFIED WITHDarnell Level COFFEE PHARMD 1427 10/19/20 A BROWNING Performed at [facility], [address], [place], [place] [address]    Culture ESCHERICHIA COLI (A)  Final   Report Status 10/21/2020 FINAL  Final   Organism ID, Bacteria ESCHERICHIA COLI  Final      Susceptibility   Escherichia coli - MIC*    AMPICILLIN <=2 SENSITIVE Sensitive     CEFAZOLIN <=4 SENSITIVE Sensitive     CEFEPIME <=0.12 SENSITIVE Sensitive     CEFTAZIDIME <=1 SENSITIVE Sensitive     CEFTRIAXONE <=0.25 SENSITIVE Sensitive     CIPROFLOXACIN <=0.25 SENSITIVE Sensitive     GENTAMICIN <=1 SENSITIVE Sensitive     IMIPENEM <=0.25 SENSITIVE Sensitive     TRIMETH/SULFA <=20 SENSITIVE Sensitive     AMPICILLIN/SULBACTAM <=2 SENSITIVE Sensitive     PIP/TAZO <=4 SENSITIVE Sensitive     * ESCHERICHIA COLI  Blood culture (routine x 2)     Status: None   Collection Time: 10/17/20  1:17 PM   Specimen: BLOOD LEFT FOREARM  Result Value Ref Range Status   Specimen Description BLOOD LEFT FOREARM  Final   Special Requests   Final    Blood Culture results may not be optimal due to an excessive volume of blood received in culture bottles BOTTLES DRAWN AEROBIC AND ANAEROBIC   Culture   Final    NO GROWTH 6 DAYS Performed at [facility], [address], [place], [place] [address]  Report Status 10/23/2020 FINAL  Final  Blood Culture ID Panel (Reflexed)     Status: Abnormal   Collection  Time: 10/17/20  1:17 PM  Result Value Ref Range Status   Enterococcus faecalis NOT DETECTED NOT DETECTED Final   Enterococcus Faecium NOT DETECTED NOT DETECTED Final   Listeria monocytogenes NOT DETECTED NOT DETECTED Final   Staphylococcus species NOT DETECTED NOT DETECTED Final   Staphylococcus aureus (BCID) NOT DETECTED NOT DETECTED Final   Staphylococcus epidermidis NOT DETECTED NOT DETECTED Final   Staphylococcus lugdunensis NOT DETECTED NOT DETECTED Final   Streptococcus species NOT DETECTED NOT DETECTED Final   Streptococcus agalactiae NOT DETECTED NOT DETECTED Final   Streptococcus pneumoniae NOT DETECTED NOT DETECTED Final   Streptococcus pyogenes NOT DETECTED NOT DETECTED Final   A.calcoaceticus-baumannii NOT DETECTED NOT DETECTED Final   Bacteroides fragilis NOT DETECTED NOT DETECTED Final   Enterobacterales DETECTED (A) NOT DETECTED Final    Comment: Enterobacterales represent a large order of gram negative bacteria, not a single organism. CRITICAL RESULT CALLED TO, READ BACK BY AND VERIFIED WITH: G COFFEE PHARMD 1427 10/19/20 A BROWNING    Enterobacter cloacae complex NOT DETECTED NOT DETECTED Final   Escherichia coli DETECTED (A) NOT DETECTED Final    Comment: CRITICAL RESULT CALLED TO, READ BACK BY AND VERIFIED WITH: G COFFEE PHARMD 1427 10/19/20 A BROWNING    Klebsiella aerogenes NOT DETECTED NOT DETECTED Final   Klebsiella oxytoca NOT DETECTED NOT DETECTED Final   Klebsiella pneumoniae NOT DETECTED NOT DETECTED Final   Proteus species NOT DETECTED NOT DETECTED Final   Salmonella species NOT DETECTED NOT DETECTED Final   Serratia marcescens NOT DETECTED NOT DETECTED Final   Haemophilus influenzae NOT DETECTED NOT DETECTED Final   Neisseria meningitidis NOT DETECTED NOT DETECTED Final   Pseudomonas aeruginosa NOT DETECTED NOT DETECTED Final   Stenotrophomonas maltophilia NOT DETECTED NOT DETECTED Final   Candida albicans NOT DETECTED NOT DETECTED Final   Candida auris  NOT DETECTED NOT DETECTED Final   Candida glabrata NOT DETECTED NOT DETECTED Final   Candida krusei NOT DETECTED NOT DETECTED Final   Candida parapsilosis NOT DETECTED NOT DETECTED Final   Candida tropicalis NOT DETECTED NOT DETECTED Final   Cryptococcus neoformans/gattii NOT DETECTED NOT DETECTED Final   CTX-M ESBL NOT DETECTED NOT DETECTED Final   Carbapenem resistance IMP NOT DETECTED NOT DETECTED Final   Carbapenem resistance KPC NOT DETECTED NOT DETECTED Final   Carbapenem resistance NDM NOT DETECTED NOT DETECTED Final   Carbapenem resist OXA 48 LIKE NOT DETECTED NOT DETECTED Final   Carbapenem resistance VIM NOT DETECTED NOT DETECTED Final    Comment: Performed at [facility], [address], [place], [place] [address]  Resp Panel by RT-PCR (Flu A&B, Covid) Nasopharyngeal Swab     Status: None   Collection Time: 10/17/20  1:49 PM   Specimen: Nasopharyngeal Swab; Nasopharyngeal(NP) swabs in vial transport medium  Result Value Ref Range Status   SARS Coronavirus 2 by RT PCR NEGATIVE NEGATIVE Final    Comment: (NOTE) SARS-CoV-2 target nucleic acids are NOT DETECTED.  The SARS-CoV-2 RNA is generally detectable in upper respiratory specimens during the acute phase of infection. The lowest concentration of SARS-CoV-2 viral copies this assay can detect is 138 copies/mL. A negative result does not preclude SARS-Cov-2 infection and should not be used as the sole basis for treatment or other patient management decisions. A negative result may occur with  improper specimen collection/handling, submission of specimen other than  nasopharyngeal swab, presence of viral mutation(s) within the areas targeted by this assay, and inadequate number of viral copies(<138 copies/mL). A negative result must be combined with clinical observations, patient history, and epidemiological information. The expected result is Negative.  Fact Sheet for Patients:   EntrepreneurPulse.com.au  Fact Sheet for Healthcare Providers:  IncredibleEmployment.be  This test is no t yet approved or cleared by the [place] FDA and  has been authorized for detection and/or diagnosis of SARS-CoV-2 by FDA under an Emergency Use Authorization (EUA). This EUA will remain  in effect (meaning this test can be used) for the duration of the COVID-19 declaration under Section 564(b)(1) of the Act, 21 U.S.C.section 360bbb-3(b)(1), unless the authorization is terminated  or revoked sooner.       Influenza A by PCR NEGATIVE NEGATIVE Final   Influenza B by PCR NEGATIVE NEGATIVE Final    Comment: (NOTE) The Xpert Xpress SARS-CoV-2/FLU/RSV plus assay is intended as an aid in the diagnosis of influenza from Nasopharyngeal swab specimens and should not be used as a sole basis for treatment. Nasal washings and aspirates are unacceptable for Xpert Xpress SARS-CoV-2/FLU/RSV testing.  Fact Sheet for Patients: EntrepreneurPulse.com.au  Fact Sheet for Healthcare Providers: IncredibleEmployment.be  This test is not yet approved or cleared by the [place] FDA and has been authorized for detection and/or diagnosis of SARS-CoV-2 by FDA under an Emergency Use Authorization (EUA). This EUA will remain in effect (meaning this test can be used) for the duration of the COVID-19 declaration under Section 564(b)(1) of the Act, 21 U.S.C. section 360bbb-3(b)(1), unless the authorization is terminated or revoked.  Performed at [facility], [address], [place], [place] [address]   Surgical pcr screen     Status: None   Collection Time: 10/21/20 12:06 AM   Specimen: Nasal Mucosa; Nasal Swab  Result Value Ref Range Status   MRSA, PCR NEGATIVE NEGATIVE Final   Staphylococcus aureus NEGATIVE NEGATIVE Final    Comment: (NOTE) The Xpert SA Assay (FDA approved for NASAL specimens in patients 90 years of  age and older), is one component of a comprehensive surveillance program. It is not intended to diagnose infection nor to guide or monitor treatment. Performed at [facility], [address] [place], [place] [address]   Resp Panel by RT-PCR (Flu A&B, Covid) Nasopharyngeal Swab     Status: None   Collection Time: 10/26/20  2:42 PM   Specimen: Nasopharyngeal Swab; Nasopharyngeal(NP) swabs in vial transport medium  Result Value Ref Range Status   SARS Coronavirus 2 by RT PCR NEGATIVE NEGATIVE Final    Comment: (NOTE) SARS-CoV-2 target nucleic acids are NOT DETECTED.  The SARS-CoV-2 RNA is generally detectable in upper respiratory specimens during the acute phase of infection. The lowest concentration of SARS-CoV-2 viral copies this assay can detect is 138 copies/mL. A negative result does not preclude SARS-Cov-2 infection and should not be used as the sole basis for treatment or other patient management decisions. A negative result may occur with  improper specimen collection/handling, submission of specimen other than nasopharyngeal swab, presence of viral mutation(s) within the areas targeted by this assay, and inadequate number of viral copies(<138 copies/mL). A negative result must be combined with clinical observations, patient history, and epidemiological information. The expected result is Negative.  Fact Sheet for Patients:  EntrepreneurPulse.com.au  Fact Sheet for Healthcare Providers:  IncredibleEmployment.be  This test is no t yet approved or cleared by the [place] FDA and  has been authorized for detection  and/or diagnosis of SARS-CoV-2 by FDA under an Emergency Use Authorization (EUA). This EUA will remain  in effect (meaning this test can be used) for the duration of the COVID-19 declaration under Section 564(b)(1) of the Act, 21 U.S.C.section 360bbb-3(b)(1), unless the authorization is terminated  or revoked sooner.        Influenza A by PCR NEGATIVE NEGATIVE Final   Influenza B by PCR NEGATIVE NEGATIVE Final    Comment: (NOTE) The Xpert Xpress SARS-CoV-2/FLU/RSV plus assay is intended as an aid in the diagnosis of influenza from Nasopharyngeal swab specimens and should not be used as a sole basis for treatment. Nasal washings and aspirates are unacceptable for Xpert Xpress SARS-CoV-2/FLU/RSV testing.  Fact Sheet for Patients: EntrepreneurPulse.com.au  Fact Sheet for Healthcare Providers: IncredibleEmployment.be  This test is not yet approved or cleared by the [place] FDA and has been authorized for detection and/or diagnosis of SARS-CoV-2 by FDA under an Emergency Use Authorization (EUA). This EUA will remain in effect (meaning this test can be used) for the duration of the COVID-19 declaration under Section 564(b)(1) of the Act, 21 U.S.C. section 360bbb-3(b)(1), unless the authorization is terminated or revoked.  Performed at [facility], [address] [place], [place] [address]          Radiology Studies: [facility] Chest [facility] 1 View  Result Date: 10/26/2020 CLINICAL DATA:  Chest pain EXAM: PORTABLE CHEST 1 VIEW COMPARISON:  09/07/2020, 10/17/2020 FINDINGS: Single frontal view of the chest demonstrates stable cardiac silhouette. Single lead pacer unchanged. There are bibasilar veiling opacities consistent with effusions and consolidation, new since prior study. No pneumothorax. IMPRESSION: 1. Bilateral pleural effusions and bibasilar consolidation, likely atelectasis. 2. Stable enlarged cardiac silhouette. Electronically Signed   By: Randa Ngo M.D.   On: 10/26/2020 15:34        Scheduled Meds:  atenolol  12.5 mg Oral BID   cycloSPORINE  1 drop Both Eyes BID   enoxaparin (LOVENOX) injection  60 mg Subcutaneous Q12H   furosemide  40 mg Intravenous Daily   levothyroxine  100 mcg Oral q morning   loratadine  10 mg Oral Daily   methocarbamol  750 mg  Oral QID   oxyCODONE-acetaminophen  1 tablet Oral Q6H   pantoprazole  40 mg Oral Daily   predniSONE  5 mg Oral q morning   Warfarin - Pharmacist Dosing Inpatient   Does not apply q1600   Continuous Infusions:     LOS: 9 days     Alma Friendly, MD [facility]  If 7PM-7AM, please contact night-coverage [email or web address] 10/26/2020, 4:48 PM

## 2020-10-26 NOTE — NC FL2 (Signed)
[place] LEVEL OF CARE SCREENING TOOL     IDENTIFICATION  Patient Name: Angela Wong Birthdate: 1940-03-28 Sex: female Admission Date (Current Location): 10/17/2020  [facility] and [place] Number:  [employer] and Address:  [place] [address] [place]      Provider Number: [id number]  Attending Physician Name and Address:  Alma Friendly, MD  Relative Name and Phone Number:  Deandrea Vanpelt - son [phone]    Current Level of Care: Hospital Recommended Level of Care: [place] Prior Approval Number:    Date Approved/Denied:   PASRR Number: Pending  Discharge Plan: SNF    Current Diagnoses: Patient Active Problem List   Diagnosis Date Noted   Pain    Calculus of gallbladder with acute cholecystitis and obstruction    Choledocholithiasis 10/17/2020   Elevated LFTs    Dysphagia    Multiple allergies 09/29/2020   Pacemaker 07/22/2019   Drug intolerance 07/22/2019   Edema of both lower extremities 07/22/2019   Subtherapeutic international normalized ratio (INR) 07/05/2019   CAD (coronary artery disease) 07/05/2019   Non-STEMI (non-ST elevated myocardial infarction) ([place]) 07/01/2019   Chronic constipation 06/29/2019   Hypertensive heart disease with heart failure ([place]) 06/29/2019   Decompensated heart failure ([place]) 06/22/2019   Pressure injury of skin 06/22/2019   RA (rheumatoid arthritis) ([place]) 05/04/2019   Anticoagulation goal of INR 2 to 3 12/18/2017   Depression, major, single episode, moderate ([place]) 09/12/2017   Chest pain 08/25/2017   Generalized weakness 08/25/2017   Seasonal and perennial allergic rhinitis 12/31/2016   Allergic rhinitis with a nonallergic component 10/01/2016   Itching 10/01/2016   Skeeter syndrome 10/01/2016   Steroid-induced osteopenia 07/11/2016   Hyperparathyroidism ([place]) 01/16/2016   Iron deficiency anemia 12/18/2015   Insomnia 12/18/2015   Hypothyroidism  12/18/2015   Rotator cuff tear 03/23/2014   Osteoarthritis    Moderate persistent asthma without complication    Ovarian cancer ([place])    Fibromyalgia    RLS (restless legs syndrome)    Cerebral vasculitis    Sjogren's syndrome (HCC)    Raynaud's disease    MVP (mitral valve prolapse)    IBS (irritable bowel syndrome)    Congestive dilated cardiomyopathy ([place]) 02/09/2014   Dyspnea 12/13/2013   Dyslipidemia 11/26/2013   Malaise and fatigue 11/26/2013   Allergy to multiple antibiotics 11/02/2013   History of ovarian cancer 10/01/2013   GERD (gastroesophageal reflux disease) 10/01/2013   Hypertension 10/01/2013   Atrial fibrillation, permanent ([place]) 07/27/2013    Orientation RESPIRATION BLADDER Height & Weight     Self, Time, Situation, Place  Normal External catheter Weight: 59.4 kg Height:  5\' 4"  (162.6 cm)  BEHAVIORAL SYMPTOMS/MOOD NEUROLOGICAL BOWEL NUTRITION STATUS      Continent Diet (See Dc Summary)  AMBULATORY STATUS COMMUNICATION OF NEEDS Skin   Extensive Assist Verbally Normal                       Personal Care Assistance Level of Assistance  Bathing, Feeding, Dressing Bathing Assistance: Limited assistance Feeding assistance: Independent Dressing Assistance: Limited assistance     Functional Limitations Info  Sight, Hearing, Speech Sight Info: Adequate Hearing Info: Adequate Speech Info: Adequate    SPECIAL CARE FACTORS FREQUENCY  PT (By licensed PT)     PT Frequency: 5 times a week              Contractures Contractures Info: Not present  Additional Factors Info  Code Status, Allergies Code Status Info: FULL Allergies Info: Cephalosporins, ciprogloxacin, diltiazem,doxycycline, horse derived products, ketek, nitrofuran, nitrous oxide, penicillins,pentazocine, sulfta, trovan, vit B12, zinc, amlodipine, calcium channel blockers, carvedilol clindamycin, codeine,cymbalta, diovan, lisinopril, sertraline, tramadol, loteprednol            Current Medications (10/26/2020):  This is the current hospital active medication list Current Facility-Administered Medications  Medication Dose Route Frequency Provider Last Rate Last Admin   acetaminophen (TYLENOL) tablet 650 mg  650 mg Oral Q6H PRN [name] Signs, MD   650 mg at 10/19/20 0248   Or   acetaminophen (TYLENOL) suppository 650 mg  650 mg Rectal Q6H PRN [name] Signs, MD       albuterol (VENTOLIN HFA) 108 (90 Base) MCG/ACT inhaler 1-2 puff  1-2 puff Inhalation Q6H PRN [name] Signs, MD   2 puff at 10/24/20 1947   atenolol (TENORMIN) tablet 12.5 mg  12.5 mg Oral BID [name] Signs, MD   12.5 mg at 10/26/20 1008   cycloSPORINE (RESTASIS) 0.05 % ophthalmic emulsion 1 drop  1 drop Both Eyes BID [name] Signs, MD   1 drop at 10/26/20 1045   enoxaparin (LOVENOX) injection 60 mg  60 mg Subcutaneous Q12H Manuella Ghazi, Pratik D, DO   60 mg at 10/26/20 1152   furosemide (LASIX) injection 40 mg  40 mg Intravenous Daily Manuella Ghazi, Pratik D, DO   40 mg at 10/26/20 1009   levothyroxine (SYNTHROID) tablet 100 mcg  100 mcg Oral q morning [name] Signs, MD   100 mcg at 10/26/20 0457   loratadine (CLARITIN) tablet 10 mg  10 mg Oral Daily [name] Signs, MD   10 mg at 10/26/20 1007   methocarbamol (ROBAXIN) tablet 750 mg  750 mg Oral QID [name] Signs, MD   750 mg at 10/26/20 1148   ondansetron (ZOFRAN) tablet 4 mg  4 mg Oral Q6H PRN [name] Signs, MD       Or   ondansetron [facility]) injection 4 mg  4 mg Intravenous Q6H PRN [name] Signs, MD   4 mg at 10/23/20 1139   oxyCODONE-acetaminophen (PERCOCET) 7.5-325 MG per tablet 1 tablet  1 tablet Oral Q6H [name] Signs, MD   1 tablet at 10/26/20 1147   pantoprazole (PROTONIX) EC tablet 40 mg  40 mg Oral Daily Alma Friendly, MD   40 mg at 10/26/20 1008   predniSONE (DELTASONE) tablet 5 mg  5 mg Oral q morning [name] Signs, MD   5 mg at 10/26/20 1013   promethazine (PHENERGAN) 12.5 mg in sodium chloride 0.9 % 50 mL IVPB  12.5 mg Intravenous Q6H PRN Manuella Ghazi, Pratik  D, DO   Stopped at 10/23/20 1611   simethicone (MYLICON) chewable tablet 40 mg  40 mg Oral Q6H PRN [name] Signs, MD       warfarin (COUMADIN) tablet 5 mg  5 mg Oral ONCE-1600 Coffee, Donna Christen, [facility]       Warfarin - Pharmacist Dosing Inpatient   Does not apply [id number] Rodena Goldmann, DO   Given at 10/24/20 1705     Discharge Medications: Please see discharge summary for a list of discharge medications.  Relevant Imaging Results:  Relevant Lab Results:   Additional Information SS# [id number]  Boneta Lucks, RN

## 2020-10-26 NOTE — Care Management Important Message (Signed)
Important Message  Patient Details  Name: Angela Wong MRN: [id number] Date of Birth: Mar 27, 1940   Medicare Important Message Given:  Yes     Tommy Medal 10/26/2020, 3:06 PM

## 2020-10-26 NOTE — TOC Progression Note (Signed)
Transition of Care [facility]) - Progression Note    Patient Details  Name: Angela Wong MRN: [id number] Date of Birth: 02-Dec-1940  [facility]) CM/SW Contact  Boneta Lucks, RN Phone Number: 10/26/2020, 12:33 PM  Clinical Narrative:   Patient is confused in thinking home health will provider her with 24/7 caregivers. She is now saying she does not have private caregivers to come in the home. She states she can not go home.  She will not give choices. TOC ask about [facility], she was there 06/2019. She is agreeable. PASSR system is down, FL2 completed and sent to multiple places for a bed offer. Patient is medically ready. TOC to review bed offers with patient.    Expected Discharge Plan: Skilled Nursing Facility Barriers to Discharge: SNF Pending bed offer  Expected Discharge Plan and Services Expected Discharge Plan: [place] arrangements for the past 2 months: Single Family Home                   DME Agency: [employer] Date DME Agency Contacted: 10/25/20 Time DME Agency Contacted: 1000 Representative spoke with at DME Agency: [place]: RN, PT, OT [facility] Agency: [place] ([place]) Date [place]: 10/25/20 Time [place]: 1000 Representative spoke with at [place]: Romualdo Bolk   Readmission Risk Interventions Readmission Risk Prevention Plan 10/25/2020  Transportation Screening Complete  HRI or [place] Complete  Social Work Consult for [place] Planning/Counseling [place] Not Applicable  Medication Review Press photographer) Complete  Some recent data might be hidden

## 2020-10-26 NOTE — Progress Notes (Signed)
ANTICOAGULATION CONSULT NOTE -   Pharmacy Consult for lovenox and warfarin bridge  Indication: atrial fibrillation  Allergies  Allergen Reactions   Cephalosporins Hives and Shortness Of Breath   Ciprofloxacin Hives and Shortness Of Breath   Diltiazem Shortness Of Breath    Swollen throat    Doxycycline Hives and Shortness Of Breath   Horse-Derived Products Anaphylaxis   Ketek [Telithromycin] Palpitations    Chest discomfort   Nitrofuran Derivatives Anaphylaxis and Hives    blisters   Nitrous Oxide Nausea And Vomiting    Severe due to Sjogrens (Auto-Immune Disease)   Other Anaphylaxis    ALLERGY TO HORSE SERUM   Penicillins Hives and Shortness Of Breath        Pentazocine Other (See Comments)    Other reaction(s): Mental Status Changes (intolerance) Altered Mental Status  Altered Mental Status  Altered Mental Status    Sulfa Antibiotics Hives and Shortness Of Breath   Trovan [Alatrofloxacin] Palpitations, Other (See Comments) and Anaphylaxis    Chest pain, dizziness, irregular pulse   Vitamin B12     Confusion and shaking    Zinc Gelatin [Zinc] Anaphylaxis   Amlodipine Swelling   Calcium Channel Blockers     Respiratory distress   Carvedilol Other (See Comments)    Dizziness, "joint pain, depression"   Clindamycin/Lincomycin     CP, lock jaw   Codeine Nausea Only   Cymbalta [Duloxetine Hcl] Swelling   Diovan [Valsartan] Swelling   Lisinopril Swelling   Sertraline Other (See Comments)    confusion   Tramadol Nausea Only   Loteprednol Etabonate Rash    Patient Measurements: Height: 5\' 4"  (162.6 cm) Weight: 59.4 kg (130 lb 15.3 oz) IBW/kg (Calculated) : 54.7 HEPARIN DW (KG): 59.4  Vital Signs: Temp: 97.7 F (36.5 C) (10/13 0903) Temp Source: Oral (10/13 0903) BP: 136/77 (10/13 1008) Pulse Rate: 99 (10/13 1008)  Labs: Recent Labs    10/24/20 0512 10/25/20 0459 10/26/20 0527  HGB 12.2 12.5  --   HCT 37.5 38.0  --   PLT 148* 154  --   LABPROT 14.4  16.4* 24.9*  INR 1.1 1.3* 2.3*  CREATININE 0.65 0.58 0.68     Estimated Creatinine Clearance: 47.6 mL/min (by C-G formula based on SCr of 0.68 mg/dL).   Medical History: Past Medical History:  Diagnosis Date   A-fib ([place])    Asthma    Cerebral vasculitis    Congestive dilated cardiomyopathy (HCC)    COPD (chronic obstructive pulmonary disease) (HCC)    Fibromyalgia    Gastric polyp    GERD (gastroesophageal reflux disease)    Hiatal hernia    Hyperparathyroidism (HCC)    IBS (irritable bowel syndrome)    MVP (mitral valve prolapse)    Osteoarthritis    Ovarian cancer (HCC)    lymph node removal with hysterectomy   Raynaud's disease    RLS (restless legs syndrome)    Situational depression    Sjogren's syndrome (HCC)    Vasculitis (HCC)    Vitamin D deficiency     Medications:  Medications Prior to Admission  Medication Sig Dispense Refill Last Dose   albuterol (VENTOLIN HFA) 108 (90 Base) MCG/ACT inhaler INHALE 2 PUFFS BY MOUTH EVERY 6 HOURS AS NEEDED FOR WHEEZING (Patient taking differently: Inhale 1-2 puffs into the lungs every 6 (six) hours as needed for wheezing or shortness of breath.) 9 g 0 PRN   atenolol (TENORMIN) 25 MG tablet Take 0.5 tablets (12.5 mg total) by mouth  2 (two) times daily. 60 tablet 5 10/17/2020 at 0800   fexofenadine (ALLEGRA) 180 MG tablet Take 180 mg by mouth every morning.    10/17/2020 at 0800   hydrocortisone-pramoxine [facility]) 2.5-1 % rectal cream Place 1 application rectally See admin instructions. After each bathroom use   Past Month   levothyroxine (EUTHYROX) 50 MCG tablet Take 2 tablets (100 mcg total) by mouth every morning. 180 tablet 3 10/17/2020 at 0300   methocarbamol (ROBAXIN) 750 MG tablet Take 750 mg by mouth 4 (four) times daily.   10/17/2020   oxyCODONE-acetaminophen (PERCOCET) 7.5-325 MG tablet Take 1 tablet by mouth every 6 (six) hours as needed for severe pain.   10/16/2020   predniSONE (DELTASONE) 5 MG tablet Take 1 tablet  (5 mg total) by mouth every morning. 90 tablet 1 10/17/2020 at 0800   RESTASIS 0.05 % ophthalmic emulsion INSTILL 1 DROP INTO EACH EYE TWICE DAILY (Patient taking differently: Place 1 drop into both eyes 2 (two) times daily.) 120 each 0 10/17/2020   warfarin (COUMADIN) 1 MG tablet TAKE 4MG  MONDAY, WEDNESDAY, AND FRIDAY. TAKE 2 MG TUESDAY, THURSDAY, SATURDAY, AND SUNDAY 90 tablet 3 10/16/2020 at 2100   azithromycin (ZITHROMAX Z-PAK) 250 MG tablet As directed (Patient not taking: Reported on 10/17/2020) 6 tablet 0 Completed Course   Scheduled:   atenolol  12.5 mg Oral BID   cycloSPORINE  1 drop Both Eyes BID   enoxaparin (LOVENOX) injection  60 mg Subcutaneous Q12H   furosemide  40 mg Intravenous Daily   levothyroxine  100 mcg Oral q morning   loratadine  10 mg Oral Daily   methocarbamol  750 mg Oral QID   oxyCODONE-acetaminophen  1 tablet Oral Q6H   pantoprazole  40 mg Oral Daily   predniSONE  5 mg Oral q morning   Warfarin - Pharmacist Dosing Inpatient   Does not apply q1600   Infusions:   promethazine (PHENERGAN) injection (IM or IVPB) Stopped (10/23/20 1611)   PRN: acetaminophen **OR** acetaminophen, albuterol, ondansetron **OR** ondansetron (ZOFRAN) IV, promethazine (PHENERGAN) injection (IM or IVPB), simethicone Anti-infectives (From admission, onward)    Start     Dose/Rate Route Frequency Ordered Stop   10/23/20 1400  meropenem (MERREM) 1 g in sodium chloride 0.9 % 100 mL IVPB        1 g 200 mL/hr over 30 Minutes Intravenous Every 12 hours 10/23/20 1310 10/23/20 2148   10/17/20 2200  meropenem (MERREM) 1 g in sodium chloride 0.9 % 100 mL IVPB  Status:  Discontinued        1 g 200 mL/hr over 30 Minutes Intravenous Every 12 hours 10/17/20 1544 10/23/20 1106   10/17/20 1315  meropenem (MERREM) 1 g in sodium chloride 0.9 % 100 mL IVPB        1 g 200 mL/hr over 30 Minutes Intravenous  Once 10/17/20 1307 10/17/20 1518       Assessment: Pharmacy has been consulted to restart heparin  gtt 10/11 AM and bridge to warfarin INR goal 2-3. Heparin had been held due to surgery but was therapeutic HL 0.41 on heparin iv 1300units/hr. Vit K given to reverse anticoag on 10/4 and 10/5.   Home dose of coumadin is 4mg  M,W,F then 2mg  on T, TH, Sat, Sun. Will give slight booster dose d/t vit k given Lap chole 10/10. Discussed with MD and will bridge with lovenox.  INR 1.1> 1.3, will give another booster dose.  Goal of Therapy:  INR 2-3 Heparin level 0.3-0.7  units/ml Monitor platelets by anticoagulation protocol: Yes   Plan:   Continue Lovenox 1mg /kg (60mg ) sq q12h Warfarin 5 mg po x 1 today Daily PT-INR Continue to monitor H&H and platelets  Thomasenia Sales, PharmD, MBA, BCGP Clinical Pharmacist  10/26/2020,11:00 AM

## 2020-10-26 NOTE — Progress Notes (Signed)
Pt complains with chest pain radiates around to back on L side. MD made aware. STAT orders for chest xray, EKG, and Troponin. EKG done and placed on chart. Awaiting lab results. Will plan for DC tomorrow per social worker and MD.

## 2020-10-27 ENCOUNTER — Inpatient Hospital Stay (HOSPITAL_COMMUNITY): Payer: Medicare Other

## 2020-10-27 DIAGNOSIS — R079 Chest pain, unspecified: Secondary | ICD-10-CM

## 2020-10-27 DIAGNOSIS — I5021 Acute systolic (congestive) heart failure: Secondary | ICD-10-CM

## 2020-10-27 LAB — BASIC METABOLIC PANEL
Anion gap: 7 (ref 5–15)
BUN: 13 mg/dL (ref 8–23)
CO2: 35 mmol/L — ABNORMAL HIGH (ref 22–32)
Calcium: 8.2 mg/dL — ABNORMAL LOW (ref 8.9–10.3)
Chloride: 98 mmol/L (ref 98–111)
Creatinine, Ser: 0.65 mg/dL (ref 0.44–1.00)
GFR, Estimated: >60 mL/min (ref >60)
Glucose, Bld: 79 mg/dL (ref 70–99)
Potassium: 3.6 mmol/L (ref 3.5–5.1)
Sodium: 140 mmol/L (ref 135–145)

## 2020-10-27 LAB — PROTIME-INR
INR: 2.8 — ABNORMAL HIGH (ref 0.8–1.2)
Prothrombin Time: 29.1 seconds — ABNORMAL HIGH (ref 11.4–15.2)

## 2020-10-27 LAB — ECHOCARDIOGRAM COMPLETE
AR max vel: 1.74 cm2
AV Area VTI: 1.5 cm2
AV Area mean vel: 1.64 cm2
AV Mean grad: 3 mmHg
AV Peak grad: 4.9 mmHg
Ao pk vel: 1.11 m/s
Area-P 1/2: 4.36 cm2
Calc EF: 31.7 %
Height: 64 in
MV VTI: 1.02 cm2
S' Lateral: 4.1 cm
Single Plane A2C EF: 31 %
Single Plane A4C EF: 32.1 %
Weight: 2095.25 oz

## 2020-10-27 LAB — MAGNESIUM: Magnesium: 1.8 mg/dL (ref 1.7–2.4)

## 2020-10-27 MED ORDER — FUROSEMIDE 40 MG PO TABS: 40.0000 mg | ORAL_TABLET | Freq: Every day | ORAL | 0 refills | Status: DC

## 2020-10-27 MED ORDER — WARFARIN SODIUM 2 MG PO TABS
2.0000 mg | ORAL_TABLET | Freq: Once | ORAL | Status: AC
  Administered 2020-10-27: 2 mg via ORAL
  Filled 2020-10-27: qty 1

## 2020-10-27 NOTE — Progress Notes (Signed)
MD at bedside discussed echo results with patient and pt to follow up with cardiology on Monday concerning heart failure. Nurse and MD called son Corene Cornea to give update and to come to a decision with whether patient and he felt best to go to rehab today or wait until Monday. Pt concerned for money to cover staying so she agreed to go today. Called Social worker to update and notified MD to get DC summary in. Called report to [place] at [place] rehab facility. Received call from social worker again and she states that [place] at facility was unable to take patient due to pharmacy unavailable at this late in the day so will resume DC for first thing in the morning for EMS to transport to rehab facility. Patient will continue IV lasix per MD. We will continue to monitor patient closely during her stay. Call light within reach.

## 2020-10-27 NOTE — TOC Progression Note (Signed)
Transition of Care [facility]) - Progression Note    Patient Details  Name: Angela Wong MRN: [id number] Date of Birth: 1940/12/12  Transition of Care [facility]) CM/SW Contact  Boneta Lucks, RN Phone Number: 10/27/2020, 4:18 PM  Clinical Narrative:  Patient is medically ready for discharge, MD reviewed Echo and spoke with her son. TOC reprinted med necessity for EMS. RN to call report.     Expected Discharge Plan: [place] Barriers to Discharge: Barriers Resolved  Expected Discharge Plan and Services Expected Discharge Plan: [place] arrangements for the past 2 months: Single Family Home Expected Discharge Date: 10/26/20                 DME Agency: [employer] Date DME Agency Contacted: 10/25/20 Time DME Agency Contacted: 1000 Representative spoke with at DME Agency: [place]: RN, PT, OT [place] Agency: [place] ([place]) Date [place]: 10/25/20 Time [place]: 1000 Representative spoke with at [name]: Romualdo Bolk    Readmission Risk Interventions Readmission Risk Prevention Plan 10/25/2020  Transportation Screening Complete  HRI or [place] Complete  Social Work Consult for [place] Planning/Counseling [place] Not Applicable  Medication Review Press photographer) Complete  Some recent data might be hidden

## 2020-10-27 NOTE — Progress Notes (Signed)
ANTICOAGULATION CONSULT NOTE -   Pharmacy Consult for lovenox and warfarin bridge  Indication: atrial fibrillation  Allergies  Allergen Reactions   Cephalosporins Hives and Shortness Of Breath   Ciprofloxacin Hives and Shortness Of Breath   Diltiazem Shortness Of Breath    Swollen throat    Doxycycline Hives and Shortness Of Breath   Horse-Derived Products Anaphylaxis   Ketek [Telithromycin] Palpitations    Chest discomfort   Nitrofuran Derivatives Anaphylaxis and Hives    blisters   Nitrous Oxide Nausea And Vomiting    Severe due to Sjogrens (Auto-Immune Disease)   Other Anaphylaxis    ALLERGY TO HORSE SERUM   Penicillins Hives and Shortness Of Breath        Pentazocine Other (See Comments)    Other reaction(s): Mental Status Changes (intolerance) Altered Mental Status  Altered Mental Status  Altered Mental Status    Sulfa Antibiotics Hives and Shortness Of Breath   Trovan [Alatrofloxacin] Palpitations, Other (See Comments) and Anaphylaxis    Chest pain, dizziness, irregular pulse   Vitamin B12     Confusion and shaking    Zinc Gelatin [Zinc] Anaphylaxis   Amlodipine Swelling   Calcium Channel Blockers     Respiratory distress   Carvedilol Other (See Comments)    Dizziness, "joint pain, depression"   Clindamycin/Lincomycin     CP, lock jaw   Codeine Nausea Only   Cymbalta [Duloxetine Hcl] Swelling   Diovan [Valsartan] Swelling   Lisinopril Swelling   Sertraline Other (See Comments)    confusion   Tramadol Nausea Only   Loteprednol Etabonate Rash    Patient Measurements: Height: 5\' 4"  (162.6 cm) Weight: 59.4 kg (130 lb 15.3 oz) IBW/kg (Calculated) : 54.7 HEPARIN DW (KG): 59.4  Vital Signs: Temp: 98.4 F (36.9 C) (10/14 0500) Temp Source: Oral (10/14 0500) BP: 128/71 (10/14 0500) Pulse Rate: 72 (10/14 0500)  Labs: Recent Labs    10/25/20 0459 10/26/20 0527 10/27/20 0521  HGB 12.5  --   --   HCT 38.0  --   --   PLT 154  --   --   LABPROT 16.4*  24.9* 29.1*  INR 1.3* 2.3* 2.8*  CREATININE 0.58 0.68 0.65     Estimated Creatinine Clearance: 47.6 mL/min (by C-G formula based on SCr of 0.65 mg/dL).   Medical History: Past Medical History:  Diagnosis Date   A-fib ([place])    Asthma    Cerebral vasculitis    Congestive dilated cardiomyopathy (HCC)    COPD (chronic obstructive pulmonary disease) (HCC)    Fibromyalgia    Gastric polyp    GERD (gastroesophageal reflux disease)    Hiatal hernia    Hyperparathyroidism (HCC)    IBS (irritable bowel syndrome)    MVP (mitral valve prolapse)    Osteoarthritis    Ovarian cancer (HCC)    lymph node removal with hysterectomy   Raynaud's disease    RLS (restless legs syndrome)    Situational depression    Sjogren's syndrome (HCC)    Vasculitis (HCC)    Vitamin D deficiency     Medications:  Medications Prior to Admission  Medication Sig Dispense Refill Last Dose   albuterol (VENTOLIN HFA) 108 (90 Base) MCG/ACT inhaler INHALE 2 PUFFS BY MOUTH EVERY 6 HOURS AS NEEDED FOR WHEEZING (Patient taking differently: Inhale 1-2 puffs into the lungs every 6 (six) hours as needed for wheezing or shortness of breath.) 9 g 0 PRN   atenolol (TENORMIN) 25 MG tablet Take 0.5  tablets (12.5 mg total) by mouth 2 (two) times daily. 60 tablet 5 10/17/2020 at 0800   fexofenadine (ALLEGRA) 180 MG tablet Take 180 mg by mouth every morning.    10/17/2020 at 0800   hydrocortisone-pramoxine [facility]) 2.5-1 % rectal cream Place 1 application rectally See admin instructions. After each bathroom use   Past Month   levothyroxine (EUTHYROX) 50 MCG tablet Take 2 tablets (100 mcg total) by mouth every morning. 180 tablet 3 10/17/2020 at 0300   methocarbamol (ROBAXIN) 750 MG tablet Take 750 mg by mouth 4 (four) times daily.   10/17/2020   predniSONE (DELTASONE) 5 MG tablet Take 1 tablet (5 mg total) by mouth every morning. 90 tablet 1 10/17/2020 at 0800   RESTASIS 0.05 % ophthalmic emulsion INSTILL 1 DROP INTO EACH EYE  TWICE DAILY (Patient taking differently: Place 1 drop into both eyes 2 (two) times daily.) 120 each 0 10/17/2020   warfarin (COUMADIN) 1 MG tablet TAKE 4MG  MONDAY, WEDNESDAY, AND FRIDAY. TAKE 2 MG TUESDAY, THURSDAY, SATURDAY, AND SUNDAY 90 tablet 3 10/16/2020 at 2100   [DISCONTINUED] oxyCODONE-acetaminophen (PERCOCET) 7.5-325 MG tablet Take 1 tablet by mouth every 6 (six) hours as needed for severe pain.   10/16/2020   azithromycin (ZITHROMAX Z-PAK) 250 MG tablet As directed (Patient not taking: Reported on 10/17/2020) 6 tablet 0 Completed Course   Scheduled:   atenolol  12.5 mg Oral BID   cycloSPORINE  1 drop Both Eyes BID   enoxaparin (LOVENOX) injection  60 mg Subcutaneous Q12H   furosemide  40 mg Intravenous Daily   levothyroxine  100 mcg Oral q morning   loratadine  10 mg Oral Daily   methocarbamol  750 mg Oral QID   oxyCODONE-acetaminophen  1 tablet Oral Q6H   pantoprazole  40 mg Oral Daily   predniSONE  5 mg Oral q morning   Warfarin - Pharmacist Dosing Inpatient   Does not apply q1600   Infusions:    PRN: acetaminophen **OR** acetaminophen, albuterol, ondansetron **OR** ondansetron (ZOFRAN) IV, simethicone Anti-infectives (From admission, onward)    Start     Dose/Rate Route Frequency Ordered Stop   10/23/20 1400  meropenem (MERREM) 1 g in sodium chloride 0.9 % 100 mL IVPB        1 g 200 mL/hr over 30 Minutes Intravenous Every 12 hours 10/23/20 1310 10/23/20 2148   10/17/20 2200  meropenem (MERREM) 1 g in sodium chloride 0.9 % 100 mL IVPB  Status:  Discontinued        1 g 200 mL/hr over 30 Minutes Intravenous Every 12 hours 10/17/20 1544 10/23/20 1106   10/17/20 1315  meropenem (MERREM) 1 g in sodium chloride 0.9 % 100 mL IVPB        1 g 200 mL/hr over 30 Minutes Intravenous  Once 10/17/20 1307 10/17/20 1518       Assessment: Pharmacy has been consulted to restart heparin gtt 10/11 AM and bridge to warfarin INR goal 2-3. Heparin had been held due to surgery but was  therapeutic HL 0.41 on heparin iv 1300units/hr. Vit K given to reverse anticoag on 10/4 and 10/5.   Home dose of coumadin is 4mg  M,W,F then 2mg  on T, TH, Sat, Sun. Will give slight booster dose d/t vit k given Lap chole 10/10. Discussed with MD and will bridge with lovenox. Therapeutic for > 24 hours.  INR 1.1> 1.3> 2.3> 2.8, therapeutic.  Goal of Therapy:  INR 2-3 Heparin level 0.3-0.7 units/ml Monitor platelets by anticoagulation protocol:  Yes   Plan:   D/C Lovenox  Warfarin 2 mg po x 1 today Daily PT-INR Continue to monitor H&H and platelets  Isac Sarna, BS Vena [place], BCPS Clinical Pharmacist Pager [phone] 10/27/2020,7:54 AM

## 2020-10-28 DIAGNOSIS — I5032 Chronic diastolic (congestive) heart failure: Secondary | ICD-10-CM | POA: Diagnosis not present

## 2020-10-28 DIAGNOSIS — I499 Cardiac arrhythmia, unspecified: Secondary | ICD-10-CM | POA: Diagnosis not present

## 2020-10-28 DIAGNOSIS — Z23 Encounter for immunization: Secondary | ICD-10-CM | POA: Diagnosis not present

## 2020-10-28 DIAGNOSIS — I4821 Permanent atrial fibrillation: Secondary | ICD-10-CM | POA: Diagnosis not present

## 2020-10-28 DIAGNOSIS — I2510 Atherosclerotic heart disease of native coronary artery without angina pectoris: Secondary | ICD-10-CM | POA: Diagnosis not present

## 2020-10-28 DIAGNOSIS — Z950 Presence of cardiac pacemaker: Secondary | ICD-10-CM | POA: Diagnosis not present

## 2020-10-28 DIAGNOSIS — M069 Rheumatoid arthritis, unspecified: Secondary | ICD-10-CM | POA: Diagnosis present

## 2020-10-28 DIAGNOSIS — I10 Essential (primary) hypertension: Secondary | ICD-10-CM | POA: Diagnosis present

## 2020-10-28 DIAGNOSIS — R531 Weakness: Secondary | ICD-10-CM | POA: Diagnosis present

## 2020-10-28 DIAGNOSIS — G8929 Other chronic pain: Secondary | ICD-10-CM | POA: Diagnosis not present

## 2020-10-28 DIAGNOSIS — E039 Hypothyroidism, unspecified: Secondary | ICD-10-CM | POA: Diagnosis present

## 2020-10-28 DIAGNOSIS — K8042 Calculus of bile duct with acute cholecystitis without obstruction: Secondary | ICD-10-CM | POA: Diagnosis not present

## 2020-10-28 DIAGNOSIS — K8001 Calculus of gallbladder with acute cholecystitis with obstruction: Secondary | ICD-10-CM | POA: Diagnosis present

## 2020-10-28 DIAGNOSIS — I5021 Acute systolic (congestive) heart failure: Secondary | ICD-10-CM | POA: Diagnosis not present

## 2020-10-28 DIAGNOSIS — R7401 Elevation of levels of liver transaminase levels: Secondary | ICD-10-CM | POA: Diagnosis present

## 2020-10-28 DIAGNOSIS — M4987 Spondylopathy in diseases classified elsewhere, lumbosacral region: Secondary | ICD-10-CM | POA: Diagnosis present

## 2020-10-28 DIAGNOSIS — M0579 Rheumatoid arthritis with rheumatoid factor of multiple sites without organ or systems involvement: Secondary | ICD-10-CM | POA: Diagnosis not present

## 2020-10-28 DIAGNOSIS — J811 Chronic pulmonary edema: Secondary | ICD-10-CM | POA: Diagnosis not present

## 2020-10-28 DIAGNOSIS — R1310 Dysphagia, unspecified: Secondary | ICD-10-CM | POA: Diagnosis present

## 2020-10-28 DIAGNOSIS — G2581 Restless legs syndrome: Secondary | ICD-10-CM | POA: Diagnosis present

## 2020-10-28 DIAGNOSIS — J449 Chronic obstructive pulmonary disease, unspecified: Secondary | ICD-10-CM | POA: Diagnosis present

## 2020-10-28 DIAGNOSIS — I5022 Chronic systolic (congestive) heart failure: Secondary | ICD-10-CM | POA: Diagnosis not present

## 2020-10-28 DIAGNOSIS — M4807 Spinal stenosis, lumbosacral region: Secondary | ICD-10-CM | POA: Diagnosis present

## 2020-10-28 DIAGNOSIS — I517 Cardiomegaly: Secondary | ICD-10-CM | POA: Diagnosis not present

## 2020-10-28 DIAGNOSIS — E785 Hyperlipidemia, unspecified: Secondary | ICD-10-CM | POA: Diagnosis present

## 2020-10-28 DIAGNOSIS — R278 Other lack of coordination: Secondary | ICD-10-CM | POA: Diagnosis present

## 2020-10-28 DIAGNOSIS — R109 Unspecified abdominal pain: Secondary | ICD-10-CM | POA: Diagnosis not present

## 2020-10-28 DIAGNOSIS — J90 Pleural effusion, not elsewhere classified: Secondary | ICD-10-CM | POA: Diagnosis not present

## 2020-10-28 DIAGNOSIS — K8309 Other cholangitis: Secondary | ICD-10-CM | POA: Diagnosis present

## 2020-10-28 DIAGNOSIS — K5900 Constipation, unspecified: Secondary | ICD-10-CM | POA: Diagnosis not present

## 2020-10-28 DIAGNOSIS — I420 Dilated cardiomyopathy: Secondary | ICD-10-CM | POA: Diagnosis not present

## 2020-10-28 DIAGNOSIS — R059 Cough, unspecified: Secondary | ICD-10-CM | POA: Diagnosis not present

## 2020-10-28 DIAGNOSIS — M4800 Spinal stenosis, site unspecified: Secondary | ICD-10-CM | POA: Diagnosis not present

## 2020-10-28 DIAGNOSIS — M797 Fibromyalgia: Secondary | ICD-10-CM | POA: Diagnosis not present

## 2020-10-28 DIAGNOSIS — T7840XD Allergy, unspecified, subsequent encounter: Secondary | ICD-10-CM | POA: Diagnosis not present

## 2020-10-28 DIAGNOSIS — J329 Chronic sinusitis, unspecified: Secondary | ICD-10-CM | POA: Diagnosis not present

## 2020-10-28 DIAGNOSIS — R262 Difficulty in walking, not elsewhere classified: Secondary | ICD-10-CM | POA: Diagnosis present

## 2020-10-28 DIAGNOSIS — D8481 Immunodeficiency due to conditions classified elsewhere: Secondary | ICD-10-CM | POA: Diagnosis present

## 2020-10-28 DIAGNOSIS — M3500 Sicca syndrome, unspecified: Secondary | ICD-10-CM | POA: Diagnosis present

## 2020-10-28 DIAGNOSIS — I7300 Raynaud's syndrome without gangrene: Secondary | ICD-10-CM | POA: Diagnosis not present

## 2020-10-28 DIAGNOSIS — F321 Major depressive disorder, single episode, moderate: Secondary | ICD-10-CM | POA: Diagnosis not present

## 2020-10-28 DIAGNOSIS — I480 Paroxysmal atrial fibrillation: Secondary | ICD-10-CM | POA: Diagnosis present

## 2020-10-28 DIAGNOSIS — M6281 Muscle weakness (generalized): Secondary | ICD-10-CM | POA: Diagnosis present

## 2020-10-28 DIAGNOSIS — M47816 Spondylosis without myelopathy or radiculopathy, lumbar region: Secondary | ICD-10-CM | POA: Diagnosis not present

## 2020-10-28 DIAGNOSIS — R9431 Abnormal electrocardiogram [ECG] [EKG]: Secondary | ICD-10-CM | POA: Diagnosis present

## 2020-10-28 DIAGNOSIS — Z7401 Bed confinement status: Secondary | ICD-10-CM | POA: Diagnosis not present

## 2020-10-28 DIAGNOSIS — R52 Pain, unspecified: Secondary | ICD-10-CM | POA: Diagnosis present

## 2020-10-28 DIAGNOSIS — I110 Hypertensive heart disease with heart failure: Secondary | ICD-10-CM | POA: Diagnosis present

## 2020-10-28 DIAGNOSIS — J4540 Moderate persistent asthma, uncomplicated: Secondary | ICD-10-CM | POA: Diagnosis present

## 2020-10-28 LAB — BASIC METABOLIC PANEL
Anion gap: 6 (ref 5–15)
BUN: 15 mg/dL (ref 8–23)
CO2: 34 mmol/L — ABNORMAL HIGH (ref 22–32)
Calcium: 8 mg/dL — ABNORMAL LOW (ref 8.9–10.3)
Chloride: 99 mmol/L (ref 98–111)
Creatinine, Ser: 0.6 mg/dL (ref 0.44–1.00)
GFR, Estimated: >60 mL/min (ref >60)
Glucose, Bld: 73 mg/dL (ref 70–99)
Potassium: 3.6 mmol/L (ref 3.5–5.1)
Sodium: 139 mmol/L (ref 135–145)

## 2020-10-28 LAB — PROTIME-INR
INR: 3.8 — ABNORMAL HIGH (ref 0.8–1.2)
Prothrombin Time: 37.7 seconds — ABNORMAL HIGH (ref 11.4–15.2)

## 2020-10-28 MED ORDER — WARFARIN SODIUM 1 MG PO TABS: ORAL_TABLET | ORAL | 3 refills | Status: DC

## 2020-10-28 NOTE — Progress Notes (Addendum)
ANTICOAGULATION CONSULT NOTE -   Pharmacy Consult for lovenox and warfarin bridge  Indication: atrial fibrillation  Allergies  Allergen Reactions   Cephalosporins Hives and Shortness Of Breath   Ciprofloxacin Hives and Shortness Of Breath   Diltiazem Shortness Of Breath    Swollen throat    Doxycycline Hives and Shortness Of Breath   Horse-Derived Products Anaphylaxis   Ketek [Telithromycin] Palpitations    Chest discomfort   Nitrofuran Derivatives Anaphylaxis and Hives    blisters   Nitrous Oxide Nausea And Vomiting    Severe due to Sjogrens (Auto-Immune Disease)   Other Anaphylaxis    ALLERGY TO HORSE SERUM   Penicillins Hives and Shortness Of Breath        Pentazocine Other (See Comments)    Other reaction(s): Mental Status Changes (intolerance) Altered Mental Status  Altered Mental Status  Altered Mental Status    Sulfa Antibiotics Hives and Shortness Of Breath   Trovan [Alatrofloxacin] Palpitations, Other (See Comments) and Anaphylaxis    Chest pain, dizziness, irregular pulse   Vitamin B12     Confusion and shaking    Zinc Gelatin [Zinc] Anaphylaxis   Amlodipine Swelling   Calcium Channel Blockers     Respiratory distress   Carvedilol Other (See Comments)    Dizziness, "joint pain, depression"   Clindamycin/Lincomycin     CP, lock jaw   Codeine Nausea Only   Cymbalta [Duloxetine Hcl] Swelling   Diovan [Valsartan] Swelling   Lisinopril Swelling   Sertraline Other (See Comments)    confusion   Tramadol Nausea Only   Loteprednol Etabonate Rash    Patient Measurements: Height: 5\' 4"  (162.6 cm) Weight: 59.4 kg (130 lb 15.3 oz) IBW/kg (Calculated) : 54.7 HEPARIN DW (KG): 59.4  Vital Signs: Temp: 97.9 F (36.6 C) (10/15 0530) Temp Source: Oral (10/15 0530) BP: 118/65 (10/15 0530) Pulse Rate: 59 (10/15 0530)  Labs: Recent Labs    10/26/20 0527 10/27/20 0521 10/28/20 0424  LABPROT 24.9* 29.1* 37.7*  INR 2.3* 2.8* 3.8*  CREATININE 0.68 0.65 0.60      Estimated Creatinine Clearance: 47.6 mL/min (by C-G formula based on SCr of 0.6 mg/dL).   Medical History: Past Medical History:  Diagnosis Date   A-fib ([place])    Asthma    Cerebral vasculitis    Congestive dilated cardiomyopathy (HCC)    COPD (chronic obstructive pulmonary disease) (HCC)    Fibromyalgia    Gastric polyp    GERD (gastroesophageal reflux disease)    Hiatal hernia    Hyperparathyroidism (HCC)    IBS (irritable bowel syndrome)    MVP (mitral valve prolapse)    Osteoarthritis    Ovarian cancer (HCC)    lymph node removal with hysterectomy   Raynaud's disease    RLS (restless legs syndrome)    Situational depression    Sjogren's syndrome (HCC)    Vasculitis (HCC)    Vitamin D deficiency     Medications:  Medications Prior to Admission  Medication Sig Dispense Refill Last Dose   albuterol (VENTOLIN HFA) 108 (90 Base) MCG/ACT inhaler INHALE 2 PUFFS BY MOUTH EVERY 6 HOURS AS NEEDED FOR WHEEZING (Patient taking differently: Inhale 1-2 puffs into the lungs every 6 (six) hours as needed for wheezing or shortness of breath.) 9 g 0 PRN   atenolol (TENORMIN) 25 MG tablet Take 0.5 tablets (12.5 mg total) by mouth 2 (two) times daily. 60 tablet 5 10/17/2020 at 0800   fexofenadine (ALLEGRA) 180 MG tablet Take 180 mg by  mouth every morning.    10/17/2020 at 0800   hydrocortisone-pramoxine [facility]) 2.5-1 % rectal cream Place 1 application rectally See admin instructions. After each bathroom use   Past Month   levothyroxine (EUTHYROX) 50 MCG tablet Take 2 tablets (100 mcg total) by mouth every morning. 180 tablet 3 10/17/2020 at 0300   methocarbamol (ROBAXIN) 750 MG tablet Take 750 mg by mouth 4 (four) times daily.   10/17/2020   predniSONE (DELTASONE) 5 MG tablet Take 1 tablet (5 mg total) by mouth every morning. 90 tablet 1 10/17/2020 at 0800   RESTASIS 0.05 % ophthalmic emulsion INSTILL 1 DROP INTO EACH EYE TWICE DAILY (Patient taking differently: Place 1 drop into both  eyes 2 (two) times daily.) 120 each 0 10/17/2020   [DISCONTINUED] oxyCODONE-acetaminophen (PERCOCET) 7.5-325 MG tablet Take 1 tablet by mouth every 6 (six) hours as needed for severe pain.   10/16/2020   [DISCONTINUED] warfarin (COUMADIN) 1 MG tablet TAKE 4MG  MONDAY, WEDNESDAY, AND FRIDAY. TAKE 2 MG TUESDAY, THURSDAY, SATURDAY, AND SUNDAY 90 tablet 3 10/16/2020 at 2100   azithromycin (ZITHROMAX Z-PAK) 250 MG tablet As directed (Patient not taking: Reported on 10/17/2020) 6 tablet 0 Completed Course   Scheduled:   atenolol  12.5 mg Oral BID   cycloSPORINE  1 drop Both Eyes BID   furosemide  40 mg Intravenous Daily   levothyroxine  100 mcg Oral q morning   loratadine  10 mg Oral Daily   methocarbamol  750 mg Oral QID   oxyCODONE-acetaminophen  1 tablet Oral Q6H   pantoprazole  40 mg Oral Daily   predniSONE  5 mg Oral q morning   Warfarin - Pharmacist Dosing Inpatient   Does not apply q1600   Infusions:    PRN: acetaminophen **OR** acetaminophen, albuterol, ondansetron **OR** ondansetron (ZOFRAN) IV, simethicone Anti-infectives (From admission, onward)    Start     Dose/Rate Route Frequency Ordered Stop   10/23/20 1400  meropenem (MERREM) 1 g in sodium chloride 0.9 % 100 mL IVPB        1 g 200 mL/hr over 30 Minutes Intravenous Every 12 hours 10/23/20 1310 10/23/20 2148   10/17/20 2200  meropenem (MERREM) 1 g in sodium chloride 0.9 % 100 mL IVPB  Status:  Discontinued        1 g 200 mL/hr over 30 Minutes Intravenous Every 12 hours 10/17/20 1544 10/23/20 1106   10/17/20 1315  meropenem (MERREM) 1 g in sodium chloride 0.9 % 100 mL IVPB        1 g 200 mL/hr over 30 Minutes Intravenous  Once 10/17/20 1307 10/17/20 1518       Assessment: Pharmacy has been consulted to restart heparin gtt 10/11 AM and bridge to warfarin INR goal 2-3. Heparin had been held due to surgery but was therapeutic HL 0.41 on heparin iv 1300units/hr. Vit K given to reverse anticoag on 10/4 and 10/5.   Home dose of  coumadin is 4mg  M,W,F then 2mg  on T, TH, Sat, Sun. Will give slight booster dose d/t vit k given Lap chole 10/10. Discussed with MD and will bridge with lovenox. Therapeutic for > 24 hours.  INR 1.1> 1.3> 2.3> 2.8,> 3.8=> supratherapeutic.  Goal of Therapy:  INR 2-3 Heparin level 0.3-0.7 units/ml Monitor platelets by anticoagulation protocol: Yes   Plan:   No warfarin  today Daily PT-INR Continue to monitor H&H and platelets  Isac Sarna, BS Vena Austria, BCPS Clinical Pharmacist Pager [phone] 10/28/2020,9:55 AM

## 2020-10-28 NOTE — TOC Transition Note (Signed)
Transition of Care [facility]) - CM/SW Discharge Note   Patient Details  Name: BREONIA KIRSTEIN MRN: [id number] Date of Birth: 06/11/1940  Transition of Care [facility]) CM/SW Contact:  Natasha Bence, LCSW Phone Number: 10/28/2020, 11:17 AM   Clinical Narrative:    CSW notified of patient's readiness for discharge. Ebony Hail with [facility] agreeable to take patient. Nurse to call report. CSW completed med necessity and called EMS. TOC signing off.    Final next level of care: Skilled Nursing Facility Barriers to Discharge: Barriers Resolved   Patient Goals and CMS Choice Patient states their goals for this hospitalization and ongoing recovery are:: Rehab with SNF CMS Medicare.gov Compare Post Acute Care list provided to:: Patient Choice offered to / list presented to : Patient  Discharge Placement              Patient [facility] bed at: [facility] Patient to be transferred to facility by: [facility] Name of family member notified: Bitania, Shankland (Son)   [phone] Patient and family notified of of transfer: 10/28/20  Discharge Plan and Services                  DME Agency: [employer] Date DME Agency Contacted: 10/25/20 Time DME Agency Contacted: 1000 Representative spoke with at DME Agency: [place]: RN, PT, OT [facility] Agency: [place] ([place]) Date HH Agency Contacted: 10/25/20 Time HH Agency Contacted: 1000 Representative spoke with at [place]: [place] ([place]) Interventions     Readmission Risk Interventions Readmission Risk Prevention Plan 10/25/2020  Transportation Screening Complete  HRI or [place] Complete  Social Work Consult for [place] Planning/Counseling [place] Screening Not Applicable  Medication Review Press photographer) Complete  Some recent data might be hidden

## 2020-10-29 NOTE — Progress Notes (Signed)
Cardiology Office Note:   Date:  10/30/2020  NAME:  Angela Wong    MRN: [id number] DOB:  [birth year]   PCP:  Hal Morales, DO  Cardiologist:  Minus Breeding, MD  Electrophysiologist:  None   Referring MD: [name], FNP   Chief Complaint  Patient presents with   Congestive Heart Failure   History of Present Illness:   Angela Wong is a 80 y.o. female with a hx of permament Afib s/p ppm, CAD, systolic heart failure who is being seen today for the evaluation of systolic heart failure at the request of Simpson-Tarokh, Leann, DO.  Admission 10/17/2020-10/28/2020 for acute choledocholithiasis with cholangitis and E. Coli bacteremia. S/p ERCP with stone extraction and laparoscopic cholecystectomy. Did well. During that admission complained of chest pain. Echo showed EF has dropped from 50% to 30-35%.   EKG with Afib and intermittent V pacing.   She presents for follow-up today.  Reports she is doing well.  She is euvolemic on examination.  She has been discharged to a skilled nursing facility with plans for rehabilitation.  Regarding her history of congestive heart failure she sees a doctor within [place] system.  She is allergic to multiple medications including Coreg, bisoprolol, metoprolol, lisinopril, losartan, spironolactone.  She cannot take any heart failure medications.  She cannot tolerate Lasix and atenolol it seems.  She cannot tolerate DOAC's.  She can only tolerate Coumadin for A. fib.  Rates that seem to be acceptable today.  She overall is doing well.  She reports that her energy level is low.  She did have an extensive hospitalization and October due to cholangitis.  She is status post ERCP and cholecystectomy.  She overall seems to be doing well.  We discussed her diagnosis of congestive heart failure and the fact that her echocardiogram shows her EF has reduced to 30-35%.  She informed me she would like to try to treat this with exercise and lifestyle  modifications.  She cannot tolerate other heart failure medications.  Problem List Permanent Afib SSS s/p ppm NSTEMI/non-obstructive CAD -occluded PLV 2/2 embolic event with subtherapeutic INR 4. Systolic HF -EF 66-06%  Past Medical History: Past Medical History:  Diagnosis Date   A-fib ([place])    Asthma    Cerebral vasculitis    Congestive dilated cardiomyopathy (HCC)    COPD (chronic obstructive pulmonary disease) (HCC)    Fibromyalgia    Gastric polyp    GERD (gastroesophageal reflux disease)    Hiatal hernia    Hyperparathyroidism (HCC)    IBS (irritable bowel syndrome)    MVP (mitral valve prolapse)    Osteoarthritis    Ovarian cancer (HCC)    lymph node removal with hysterectomy   Raynaud's disease    RLS (restless legs syndrome)    Situational depression    Sjogren's syndrome ([place])    Vasculitis ([place])    Vitamin D deficiency     Past Surgical History: Past Surgical History:  Procedure Laterality Date   ABDOMINAL HYSTERECTOMY  1982   with right oophorectomy   APPENDECTOMY     BREAST BIOPSY Right    x 2   BREAST SURGERY     Biopsy   CARPAL TUNNEL RELEASE Right 1980   CARPAL TUNNEL RELEASE Left 2010   x 2   CATARACT EXTRACTION Bilateral    CESAREAN SECTION     x 3   CHOLECYSTECTOMY N/A 10/23/2020   Procedure: LAPAROSCOPIC CHOLECYSTECTOMY;  Surgeon: [name] Signs, MD;  Location: [facility]  ORS;  Service: General;  Laterality: N/A;   ENDOSCOPIC RETROGRADE CHOLANGIOPANCREATOGRAPHY (ERCP) WITH PROPOFOL N/A 10/19/2020   Procedure: ENDOSCOPIC RETROGRADE CHOLANGIOPANCREATOGRAPHY (ERCP) WITH PROPOFOL;  Surgeon: Rogene Houston, MD;  Location: [facility] ORS;  Service: Endoscopy;  Laterality: N/A;   ESOPHAGEAL DILATION N/A 10/19/2020   Procedure: ESOPHAGEAL DILATION;  Surgeon: Rogene Houston, MD;  Location: [facility] ORS;  Service: Endoscopy;  Laterality: N/A;   ESOPHAGOGASTRODUODENOSCOPY (EGD) WITH PROPOFOL N/A 10/19/2020   Procedure: ESOPHAGOGASTRODUODENOSCOPY (EGD) WITH PROPOFOL;   Surgeon: Rogene Houston, MD;  Location: [facility] ORS;  Service: Endoscopy;  Laterality: N/A;   KNEE SURGERY Left 2005   LEFT HEART CATH AND CORONARY ANGIOGRAPHY N/A 07/02/2019   Procedure: LEFT HEART CATH AND CORONARY ANGIOGRAPHY;  Surgeon: Jettie Booze, MD;  Location: [place] CV LAB;  Service: Cardiovascular;  Laterality: N/A;   OOPHORECTOMY Left [place]  09/15/2014   REFRACTIVE SURGERY Bilateral 2014   SPHINCTEROTOMY N/A 10/19/2020   Procedure: SPHINCTEROTOMY;  Surgeon: Rogene Houston, MD;  Location: [facility] ORS;  Service: Endoscopy;  Laterality: N/A;   STONE EXTRACTION WITH BASKET N/A 10/19/2020   Procedure: STONE EXTRACTION WITH BASKET;  Surgeon: Rogene Houston, MD;  Location: [facility] ORS;  Service: Endoscopy;  Laterality: N/A;   TUBAL LIGATION      Current Medications: Current Meds  Medication Sig   albuterol (VENTOLIN HFA) 108 (90 Base) MCG/ACT inhaler INHALE 2 PUFFS BY MOUTH EVERY 6 HOURS AS NEEDED FOR WHEEZING (Patient taking differently: Inhale 1-2 puffs into the lungs every 6 (six) hours as needed for wheezing or shortness of breath.)   atenolol (TENORMIN) 25 MG tablet Take 0.5 tablets (12.5 mg total) by mouth 2 (two) times daily.   fexofenadine (ALLEGRA) 180 MG tablet Take 180 mg by mouth every morning.    furosemide (LASIX) 40 MG tablet Take 1 tablet (40 mg total) by mouth daily.   hydrocortisone-pramoxine (ANALPRAM-HC) 2.5-1 % rectal cream Place 1 application rectally See admin instructions. After each bathroom use   levothyroxine (EUTHYROX) 50 MCG tablet Take 2 tablets (100 mcg total) by mouth every morning.   methocarbamol (ROBAXIN) 750 MG tablet Take 750 mg by mouth 4 (four) times daily.   oxyCODONE-acetaminophen (PERCOCET) 7.5-325 MG tablet Take 1 tablet by mouth every 6 (six) hours as needed for severe pain.   predniSONE (DELTASONE) 5 MG tablet Take 1 tablet (5 mg total) by mouth every morning.   RESTASIS 0.05 % ophthalmic emulsion INSTILL 1 DROP INTO  EACH EYE TWICE DAILY (Patient taking differently: Place 1 drop into both eyes 2 (two) times daily.)   warfarin (COUMADIN) 1 MG tablet TAKE 4MG  MONDAY, WEDNESDAY, AND FRIDAY. TAKE 2 MG TUESDAY, THURSDAY, SATURDAY, AND SUNDAY     Allergies:    Cephalosporins, Ciprofloxacin, Diltiazem, Doxycycline, Horse-derived products, Ketek [telithromycin], Nitrofuran derivatives, Nitrous oxide, Other, Penicillins, Pentazocine, Sulfa antibiotics, Trovan [alatrofloxacin], Vitamin b12, Zinc gelatin [zinc], Amlodipine, Calcium channel blockers, Carvedilol, Clindamycin/lincomycin, Codeine, Cymbalta [duloxetine hcl], Diovan [valsartan], Lisinopril, Sertraline, Tramadol, and Loteprednol etabonate   Social History: Social History   Socioeconomic History   Marital status: Single    Spouse name: Not on file   Number of children: Not on file   Years of education: Not on file   Highest education level: Not on file  Occupational History   Not on file  Tobacco Use   Smoking status: Never   Smokeless tobacco: Never  Vaping Use   Vaping Use: Never used  Substance and Sexual Activity  Alcohol use: No   Drug use: No   Sexual activity: Not on file  Other Topics Concern   Not on file  Social History Narrative   Lives alone.  Moved from [place].     Caffeine- 6 cups daily, mix of caffeine/decaf   Children- 3   Retired Therapist, sports   Social Determinants of Radio broadcast assistant Strain: Not on [employer] Insecurity: Not on file  Transportation Needs: Not on file  Physical Activity: Not on file  Stress: Not on file  Social Connections: Not on file     Family History: The patient's family history includes Allergic rhinitis in her father and mother; Arthritis/Rheumatoid in her sister; Asthma in her sister; Breast cancer in her maternal aunt and mother; COPD in her mother; Heart attack in her sister; Heart disease in her father; Kidney disease in her father; Lupus in her sister; Scleroderma in her grandchild; Stroke in  her paternal grandmother and sister; Thyroid disease in an other family member.  ROS:   All other ROS reviewed and negative. Pertinent positives noted in the HPI.     EKGs/Labs/Other Studies Reviewed:   The following studies were personally reviewed by me today:   TTE 10/27/2020  1. Left ventricular ejection fraction, by estimation, is 30 to 35%. Left  ventricular ejection fraction by 2D MOD biplane is 31.7 %. The left  ventricle has moderately decreased function. The left ventricle  demonstrates global hypokinesis. Left  ventricular diastolic function could not be evaluated.   2. Right ventricular systolic function is moderately reduced. The right  ventricular size is mildly enlarged. There is mildly elevated pulmonary  artery systolic pressure. The estimated right ventricular systolic  pressure is 83.4 mmHg.   3. Left atrial size was mildly dilated.   4. Right atrial size was mild to moderately dilated.   5. The mitral valve is grossly normal. Mild mitral valve regurgitation.  No evidence of mitral stenosis.   6. Tricuspid valve regurgitation is moderate.   7. The aortic valve is tricuspid. Aortic valve regurgitation is not  visualized. No aortic stenosis is present.   8. The inferior vena cava is normal in size with greater than 50%  respiratory variability, suggesting right atrial pressure of 3 mmHg.   LHC 07/02/2019 Mid LAD lesion is 25% stenosed. RPAV lesion is 99% stenosed. This is a thrombotic lesion at the very distal PLA. The left ventricular systolic function is normal. LV end diastolic pressure is normal. The left ventricular ejection fraction is 55-65% by visual estimate. There is no aortic valve stenosis.  Recent Labs: 09/29/2020: TSH 1.190 10/25/2020: ALT 39; Hemoglobin 12.5; Platelets 154 10/27/2020: Magnesium 1.8 10/28/2020: BUN 15; Creatinine, Ser 0.60; Potassium 3.6; Sodium 139   Recent Lipid Panel    Component Value Date/Time   CHOL 119 07/02/2019 0500    CHOL 152 03/03/2019 1040   TRIG 42 07/02/2019 0500   HDL 51 07/02/2019 0500   HDL 69 03/03/2019 1040   CHOLHDL 2.3 07/02/2019 0500   VLDL 8 07/02/2019 0500   LDLCALC 60 07/02/2019 0500   LDLCALC 68 03/03/2019 1040    Physical Exam:   VS:  BP 118/60   Pulse (!) 58   Ht 5\' 4"  (1.626 m)   Wt 131 lb (59.4 kg)   SpO2 99%   BMI 22.49 kg/m    Wt Readings from Last 3 Encounters:  10/30/20 131 lb (59.4 kg)  10/25/20 130 lb 15.3 oz (59.4 kg)  10/07/20 117  lb (53.1 kg)    General: Well nourished, well developed, in no acute distress Head: Atraumatic, normal size  Eyes: PEERLA, EOMI  Neck: Supple, no JVD Endocrine: No thryomegaly Cardiac: Normal S1, S2; irregular rhythm Lungs: Clear to auscultation bilaterally, no wheezing, rhonchi or rales  Abd: Soft, nontender, no hepatomegaly  Ext: No edema, pulses 2+ Musculoskeletal: No deformities, BUE and BLE strength normal and equal Skin: Warm and dry, no rashes   Neuro: Alert and oriented to person, place, time, and situation, CNII-XII grossly intact, no focal deficits  Psych: Normal mood and affect   ASSESSMENT:   Angela Wong is a 80 y.o. female who presents for the following: 1. Chronic systolic heart failure ([place])   2. Atrial fibrillation, permanent ([place])   3. Pacemaker     PLAN:   1. Chronic systolic heart failure (HCC) -Known history of systolic heart failure with EF around 50%.  Most recent admission to the hospital with cholangitis status post ERCP and lap cholecystectomy.  Her EF is now 30-35%.  She was also admitted with what appears to be volume overload.  She seems to be doing well and is euvolemic.  We discussed how her ejection fraction has changed.  She can have her volume status managed with Lasix.  We discussed other guideline directed medical therapies.  Apparently she is intolerant of all agents that I have recommended.  She is allergic to metoprolol, Coreg, bisoprolol, lisinopril, losartan, Aldactone.  She informs  me she would not like to be rechallenged on these medications.  She believes that if she is able to exercise her heart will improve.  For now all I can offer her is Lasix.  She will continue this.  She is on atenolol but this is not beneficial beta-blocker and heart failure.  Unfortunately she cannot tolerate other medications. -Since she has a cardiologist in [place] system we will see her as needed.  I did give her my card in case she needs our assistance in the future.  2. Atrial fibrillation, permanent (HCC) -Permanent A. fib.  Rate controlled on atenolol.  She is on Coumadin.  She will continue this.  She informs me she cannot take DOAC's.  3. Pacemaker -Stable.  No issues.  Disposition: Return if symptoms worsen or fail to improve.  Medication Adjustments/Labs and Tests Ordered: Current medicines are reviewed at length with the patient today.  Concerns regarding medicines are outlined above.  No orders of the defined types were placed in this encounter.  No orders of the defined types were placed in this encounter.   Patient Instructions  Medication Instructions:  Your physician recommends that you continue on your current medications as directed. Please refer to the Current Medication list given to you today.  *If you need a refill on your cardiac medications before your next appointment, please call your pharmacy*   Lab Work: None If you have labs (blood work) drawn today and your tests are completely normal, you will receive your results only by: [place] (if you have MyChart) OR A paper copy in the mail If you have any lab test that is abnormal or we need to change your treatment, we will call you to review the results.   Testing/Procedures: None   Follow-Up: At [facility], you and your health needs are our priority.  As part of our continuing mission to provide you with exceptional heart care, we have created designated Provider Care Teams.  These Care Teams  include your primary  Cardiologist (physician) and Advanced Practice Providers (APPs -  Physician Assistants and Nurse Practitioners) who all work together to provide you with the care you need, when you need it.  We recommend signing up for the patient portal called "MyChart".  Sign up information is provided on this After Visit Summary.  MyChart is used to connect with patients for Virtual Visits (Telemedicine).  Patients are able to view lab/test results, encounter notes, upcoming appointments, etc.  Non-urgent messages can be sent to your provider as well.   To learn more about what you can do with MyChart, go to [email or web address].    Your next appointment:   Follow Up: As needed   Other Instructions    Signed, Addison Naegeli. Audie Box, MD, [place]  [address], [place], [place] [address] [phone]  10/30/2020 2:56 PM

## 2020-10-30 ENCOUNTER — Ambulatory Visit (INDEPENDENT_AMBULATORY_CARE_PROVIDER_SITE_OTHER): Payer: Medicare Other | Admitting: Cardiovascular Disease

## 2020-10-30 ENCOUNTER — Other Ambulatory Visit: Payer: Self-pay

## 2020-10-30 ENCOUNTER — Telehealth: Payer: Self-pay

## 2020-10-30 ENCOUNTER — Encounter: Payer: Self-pay | Admitting: Cardiovascular Disease

## 2020-10-30 VITALS — BP 118/60 | HR 58 | Ht 64.0 in | Wt 131.0 lb

## 2020-10-30 DIAGNOSIS — I5022 Chronic systolic (congestive) heart failure: Secondary | ICD-10-CM | POA: Diagnosis not present

## 2020-10-30 DIAGNOSIS — J449 Chronic obstructive pulmonary disease, unspecified: Secondary | ICD-10-CM | POA: Diagnosis not present

## 2020-10-30 DIAGNOSIS — R7401 Elevation of levels of liver transaminase levels: Secondary | ICD-10-CM | POA: Diagnosis not present

## 2020-10-30 DIAGNOSIS — I4821 Permanent atrial fibrillation: Secondary | ICD-10-CM | POA: Diagnosis not present

## 2020-10-30 DIAGNOSIS — Z950 Presence of cardiac pacemaker: Secondary | ICD-10-CM | POA: Diagnosis not present

## 2020-10-30 DIAGNOSIS — K8042 Calculus of bile duct with acute cholecystitis without obstruction: Secondary | ICD-10-CM | POA: Diagnosis not present

## 2020-10-30 DIAGNOSIS — K8309 Other cholangitis: Secondary | ICD-10-CM | POA: Diagnosis not present

## 2020-10-30 DIAGNOSIS — M4800 Spinal stenosis, site unspecified: Secondary | ICD-10-CM | POA: Diagnosis not present

## 2020-10-30 DIAGNOSIS — R9431 Abnormal electrocardiogram [ECG] [EKG]: Secondary | ICD-10-CM | POA: Diagnosis not present

## 2020-10-30 DIAGNOSIS — I480 Paroxysmal atrial fibrillation: Secondary | ICD-10-CM | POA: Diagnosis not present

## 2020-10-30 DIAGNOSIS — I5021 Acute systolic (congestive) heart failure: Secondary | ICD-10-CM | POA: Diagnosis not present

## 2020-10-30 NOTE — Telephone Encounter (Signed)
Transition Care Management Unsuccessful Follow-up Telephone Call  Date of discharge and from where:  10/28/20   Attempts:  1st Attempt  Reason for unsuccessful TCM follow-up call:  Left voice message

## 2020-10-30 NOTE — Chronic Care Management (AMB) (Signed)
  Care Management   Note  10/30/2020 Name: [name] MRN: [id number] DOB: 1940-10-08  Angela Wong is a 80 y.o. year old female who is a primary care patient of Hal Morales, DO and is actively engaged with the care management team. I reached out to [name] by phone today to assist with re-scheduling a follow up visit with the Pharmacist  Follow up plan: Unsuccessful telephone outreach attempt made. A HIPAA compliant phone message was left for the patient providing contact information and requesting a return call.  The care management team will reach out to the patient again over the next 5 days.  If patient returns call to provider office, please advise to call [place]  at [place], [place], [place], [place] [address] Direct Dial: [phone] [email or web address]: [email or web address]

## 2020-10-30 NOTE — Patient Instructions (Signed)
Medication Instructions:  Your physician recommends that you continue on your current medications as directed. Please refer to the Current Medication list given to you today.  *If you need a refill on your cardiac medications before your next appointment, please call your pharmacy*   Lab Work: None If you have labs (blood work) drawn today and your tests are completely normal, you will receive your results only by: [place] (if you have MyChart) OR A paper copy in the mail If you have any lab test that is abnormal or we need to change your treatment, we will call you to review the results.   Testing/Procedures: None   Follow-Up: At [facility], you and your health needs are our priority.  As part of our continuing mission to provide you with exceptional heart care, we have created designated Provider Care Teams.  These Care Teams include your primary Cardiologist (physician) and Advanced Practice Providers (APPs -  Physician Assistants and Nurse Practitioners) who all work together to provide you with the care you need, when you need it.  We recommend signing up for the patient portal called "MyChart".  Sign up information is provided on this After Visit Summary.  MyChart is used to connect with patients for Virtual Visits (Telemedicine).  Patients are able to view lab/test results, encounter notes, upcoming appointments, etc.  Non-urgent messages can be sent to your provider as well.   To learn more about what you can do with MyChart, go to [email or web address].    Your next appointment:   Follow Up: As needed   Other Instructions

## 2020-10-31 ENCOUNTER — Telehealth: Payer: Medicare Other

## 2020-10-31 DIAGNOSIS — R531 Weakness: Secondary | ICD-10-CM | POA: Diagnosis not present

## 2020-10-31 DIAGNOSIS — G8929 Other chronic pain: Secondary | ICD-10-CM | POA: Diagnosis not present

## 2020-10-31 DIAGNOSIS — M4800 Spinal stenosis, site unspecified: Secondary | ICD-10-CM | POA: Diagnosis not present

## 2020-11-01 ENCOUNTER — Telehealth: Payer: Self-pay | Admitting: Family

## 2020-11-01 NOTE — Telephone Encounter (Signed)
No answer unable to leave a message for patient to call back and schedule Medicare Annual Wellness Visit (AWV) to be completed by video or phone.   Last AWV: 10/07/2017  Please schedule at anytime with [facility] Health Advisor.  45 minute appointment  Any questions, please contact me at [phone]

## 2020-11-07 ENCOUNTER — Other Ambulatory Visit: Payer: Self-pay

## 2020-11-07 ENCOUNTER — Ambulatory Visit (INDEPENDENT_AMBULATORY_CARE_PROVIDER_SITE_OTHER): Payer: Medicare Other

## 2020-11-07 DIAGNOSIS — F321 Major depressive disorder, single episode, moderate: Secondary | ICD-10-CM

## 2020-11-07 DIAGNOSIS — M797 Fibromyalgia: Secondary | ICD-10-CM | POA: Diagnosis not present

## 2020-11-07 DIAGNOSIS — J4540 Moderate persistent asthma, uncomplicated: Secondary | ICD-10-CM

## 2020-11-07 DIAGNOSIS — I2510 Atherosclerotic heart disease of native coronary artery without angina pectoris: Secondary | ICD-10-CM

## 2020-11-07 DIAGNOSIS — E213 Hyperparathyroidism, unspecified: Secondary | ICD-10-CM

## 2020-11-07 DIAGNOSIS — M47816 Spondylosis without myelopathy or radiculopathy, lumbar region: Secondary | ICD-10-CM | POA: Diagnosis not present

## 2020-11-07 DIAGNOSIS — I5021 Acute systolic (congestive) heart failure: Secondary | ICD-10-CM | POA: Diagnosis not present

## 2020-11-07 DIAGNOSIS — E785 Hyperlipidemia, unspecified: Secondary | ICD-10-CM

## 2020-11-07 DIAGNOSIS — I420 Dilated cardiomyopathy: Secondary | ICD-10-CM

## 2020-11-07 DIAGNOSIS — I110 Hypertensive heart disease with heart failure: Secondary | ICD-10-CM

## 2020-11-07 DIAGNOSIS — E559 Vitamin D deficiency, unspecified: Secondary | ICD-10-CM

## 2020-11-07 DIAGNOSIS — I252 Old myocardial infarction: Secondary | ICD-10-CM

## 2020-11-07 DIAGNOSIS — M0579 Rheumatoid arthritis with rheumatoid factor of multiple sites without organ or systems involvement: Secondary | ICD-10-CM

## 2020-11-07 DIAGNOSIS — M3500 Sicca syndrome, unspecified: Secondary | ICD-10-CM

## 2020-11-07 DIAGNOSIS — J449 Chronic obstructive pulmonary disease, unspecified: Secondary | ICD-10-CM

## 2020-11-07 DIAGNOSIS — D509 Iron deficiency anemia, unspecified: Secondary | ICD-10-CM

## 2020-11-07 DIAGNOSIS — K5900 Constipation, unspecified: Secondary | ICD-10-CM | POA: Diagnosis not present

## 2020-11-07 DIAGNOSIS — I7300 Raynaud's syndrome without gangrene: Secondary | ICD-10-CM

## 2020-11-07 DIAGNOSIS — I5032 Chronic diastolic (congestive) heart failure: Secondary | ICD-10-CM | POA: Diagnosis not present

## 2020-11-07 DIAGNOSIS — I480 Paroxysmal atrial fibrillation: Secondary | ICD-10-CM

## 2020-11-07 DIAGNOSIS — G4700 Insomnia, unspecified: Secondary | ICD-10-CM

## 2020-11-07 DIAGNOSIS — K219 Gastro-esophageal reflux disease without esophagitis: Secondary | ICD-10-CM

## 2020-11-11 ENCOUNTER — Telehealth (INDEPENDENT_AMBULATORY_CARE_PROVIDER_SITE_OTHER): Payer: Medicare Other | Admitting: General Surgery

## 2020-11-11 DIAGNOSIS — Z09 Encounter for follow-up examination after completed treatment for conditions other than malignant neoplasm: Secondary | ICD-10-CM

## 2020-11-11 NOTE — Telephone Encounter (Signed)
Message left with patient

## 2020-11-14 DIAGNOSIS — Z23 Encounter for immunization: Secondary | ICD-10-CM | POA: Diagnosis not present

## 2020-11-16 DIAGNOSIS — E039 Hypothyroidism, unspecified: Secondary | ICD-10-CM | POA: Diagnosis not present

## 2020-11-17 DIAGNOSIS — R531 Weakness: Secondary | ICD-10-CM | POA: Diagnosis not present

## 2020-11-21 DIAGNOSIS — E039 Hypothyroidism, unspecified: Secondary | ICD-10-CM | POA: Diagnosis not present

## 2020-11-21 DIAGNOSIS — G8929 Other chronic pain: Secondary | ICD-10-CM | POA: Diagnosis not present

## 2020-11-28 DIAGNOSIS — K5900 Constipation, unspecified: Secondary | ICD-10-CM | POA: Diagnosis not present

## 2020-11-29 DIAGNOSIS — I10 Essential (primary) hypertension: Secondary | ICD-10-CM | POA: Diagnosis not present

## 2020-11-29 DIAGNOSIS — J449 Chronic obstructive pulmonary disease, unspecified: Secondary | ICD-10-CM | POA: Diagnosis not present

## 2020-11-29 DIAGNOSIS — I480 Paroxysmal atrial fibrillation: Secondary | ICD-10-CM | POA: Diagnosis not present

## 2020-11-29 DIAGNOSIS — G8929 Other chronic pain: Secondary | ICD-10-CM | POA: Diagnosis not present

## 2020-11-29 DIAGNOSIS — I5021 Acute systolic (congestive) heart failure: Secondary | ICD-10-CM | POA: Diagnosis not present

## 2020-11-29 DIAGNOSIS — E039 Hypothyroidism, unspecified: Secondary | ICD-10-CM | POA: Diagnosis not present

## 2020-12-01 DIAGNOSIS — T7840XD Allergy, unspecified, subsequent encounter: Secondary | ICD-10-CM | POA: Diagnosis not present

## 2020-12-06 DIAGNOSIS — R059 Cough, unspecified: Secondary | ICD-10-CM | POA: Diagnosis not present

## 2020-12-06 DIAGNOSIS — J329 Chronic sinusitis, unspecified: Secondary | ICD-10-CM | POA: Diagnosis not present

## 2020-12-06 DIAGNOSIS — R109 Unspecified abdominal pain: Secondary | ICD-10-CM | POA: Diagnosis not present

## 2020-12-06 DIAGNOSIS — I480 Paroxysmal atrial fibrillation: Secondary | ICD-10-CM | POA: Diagnosis not present

## 2020-12-07 DIAGNOSIS — J90 Pleural effusion, not elsewhere classified: Secondary | ICD-10-CM | POA: Diagnosis not present

## 2020-12-07 DIAGNOSIS — I517 Cardiomegaly: Secondary | ICD-10-CM | POA: Diagnosis not present

## 2020-12-07 DIAGNOSIS — R109 Unspecified abdominal pain: Secondary | ICD-10-CM | POA: Diagnosis not present

## 2020-12-07 DIAGNOSIS — J811 Chronic pulmonary edema: Secondary | ICD-10-CM | POA: Diagnosis not present

## 2020-12-12 DIAGNOSIS — M3500 Sicca syndrome, unspecified: Secondary | ICD-10-CM | POA: Diagnosis not present

## 2020-12-12 DIAGNOSIS — K5900 Constipation, unspecified: Secondary | ICD-10-CM | POA: Diagnosis not present

## 2020-12-12 DIAGNOSIS — J449 Chronic obstructive pulmonary disease, unspecified: Secondary | ICD-10-CM | POA: Diagnosis not present

## 2020-12-12 DIAGNOSIS — T7840XD Allergy, unspecified, subsequent encounter: Secondary | ICD-10-CM | POA: Diagnosis not present

## 2020-12-14 DIAGNOSIS — R531 Weakness: Secondary | ICD-10-CM | POA: Diagnosis present

## 2020-12-14 DIAGNOSIS — I110 Hypertensive heart disease with heart failure: Secondary | ICD-10-CM | POA: Diagnosis present

## 2020-12-14 DIAGNOSIS — J449 Chronic obstructive pulmonary disease, unspecified: Secondary | ICD-10-CM | POA: Diagnosis present

## 2020-12-14 DIAGNOSIS — K8001 Calculus of gallbladder with acute cholecystitis with obstruction: Secondary | ICD-10-CM | POA: Diagnosis present

## 2020-12-14 DIAGNOSIS — R1310 Dysphagia, unspecified: Secondary | ICD-10-CM | POA: Diagnosis present

## 2020-12-14 DIAGNOSIS — I10 Essential (primary) hypertension: Secondary | ICD-10-CM | POA: Diagnosis present

## 2020-12-14 DIAGNOSIS — I499 Cardiac arrhythmia, unspecified: Secondary | ICD-10-CM | POA: Diagnosis not present

## 2020-12-14 DIAGNOSIS — K8309 Other cholangitis: Secondary | ICD-10-CM | POA: Diagnosis present

## 2020-12-14 DIAGNOSIS — M069 Rheumatoid arthritis, unspecified: Secondary | ICD-10-CM | POA: Diagnosis present

## 2020-12-14 DIAGNOSIS — K8042 Calculus of bile duct with acute cholecystitis without obstruction: Secondary | ICD-10-CM | POA: Diagnosis not present

## 2020-12-14 DIAGNOSIS — Z950 Presence of cardiac pacemaker: Secondary | ICD-10-CM | POA: Diagnosis not present

## 2020-12-14 DIAGNOSIS — E039 Hypothyroidism, unspecified: Secondary | ICD-10-CM | POA: Diagnosis present

## 2020-12-14 DIAGNOSIS — I5021 Acute systolic (congestive) heart failure: Secondary | ICD-10-CM | POA: Diagnosis not present

## 2020-12-14 DIAGNOSIS — R7401 Elevation of levels of liver transaminase levels: Secondary | ICD-10-CM | POA: Diagnosis present

## 2020-12-14 DIAGNOSIS — E785 Hyperlipidemia, unspecified: Secondary | ICD-10-CM | POA: Diagnosis present

## 2020-12-14 DIAGNOSIS — R52 Pain, unspecified: Secondary | ICD-10-CM | POA: Diagnosis present

## 2020-12-14 DIAGNOSIS — D8481 Immunodeficiency due to conditions classified elsewhere: Secondary | ICD-10-CM | POA: Diagnosis present

## 2020-12-14 DIAGNOSIS — I480 Paroxysmal atrial fibrillation: Secondary | ICD-10-CM | POA: Diagnosis present

## 2020-12-14 DIAGNOSIS — R262 Difficulty in walking, not elsewhere classified: Secondary | ICD-10-CM | POA: Diagnosis present

## 2020-12-14 DIAGNOSIS — R278 Other lack of coordination: Secondary | ICD-10-CM | POA: Diagnosis present

## 2020-12-14 DIAGNOSIS — G2581 Restless legs syndrome: Secondary | ICD-10-CM | POA: Diagnosis present

## 2020-12-14 DIAGNOSIS — T7840XD Allergy, unspecified, subsequent encounter: Secondary | ICD-10-CM | POA: Diagnosis not present

## 2020-12-14 DIAGNOSIS — M4987 Spondylopathy in diseases classified elsewhere, lumbosacral region: Secondary | ICD-10-CM | POA: Diagnosis present

## 2020-12-14 DIAGNOSIS — R9431 Abnormal electrocardiogram [ECG] [EKG]: Secondary | ICD-10-CM | POA: Diagnosis present

## 2020-12-14 DIAGNOSIS — M4800 Spinal stenosis, site unspecified: Secondary | ICD-10-CM | POA: Diagnosis not present

## 2020-12-14 DIAGNOSIS — M4807 Spinal stenosis, lumbosacral region: Secondary | ICD-10-CM | POA: Diagnosis present

## 2020-12-14 DIAGNOSIS — M3500 Sicca syndrome, unspecified: Secondary | ICD-10-CM | POA: Diagnosis present

## 2020-12-14 DIAGNOSIS — J4540 Moderate persistent asthma, uncomplicated: Secondary | ICD-10-CM | POA: Diagnosis present

## 2020-12-14 DIAGNOSIS — M6281 Muscle weakness (generalized): Secondary | ICD-10-CM | POA: Diagnosis present

## 2020-12-26 DIAGNOSIS — I480 Paroxysmal atrial fibrillation: Secondary | ICD-10-CM | POA: Diagnosis not present

## 2020-12-26 DIAGNOSIS — I5021 Acute systolic (congestive) heart failure: Secondary | ICD-10-CM | POA: Diagnosis not present

## 2020-12-26 DIAGNOSIS — M3500 Sicca syndrome, unspecified: Secondary | ICD-10-CM | POA: Diagnosis not present

## 2020-12-26 DIAGNOSIS — E039 Hypothyroidism, unspecified: Secondary | ICD-10-CM | POA: Diagnosis not present

## 2020-12-26 DIAGNOSIS — J449 Chronic obstructive pulmonary disease, unspecified: Secondary | ICD-10-CM | POA: Diagnosis not present

## 2020-12-26 DIAGNOSIS — M4800 Spinal stenosis, site unspecified: Secondary | ICD-10-CM | POA: Diagnosis not present

## 2020-12-26 DIAGNOSIS — K8309 Other cholangitis: Secondary | ICD-10-CM | POA: Diagnosis not present

## 2020-12-26 DIAGNOSIS — K8042 Calculus of bile duct with acute cholecystitis without obstruction: Secondary | ICD-10-CM | POA: Diagnosis not present

## 2020-12-28 ENCOUNTER — Telehealth: Payer: Self-pay | Admitting: *Deleted

## 2020-12-28 DIAGNOSIS — I4821 Permanent atrial fibrillation: Secondary | ICD-10-CM

## 2020-12-28 NOTE — Telephone Encounter (Signed)
Patient aware of recommendation and verbalizes understanding. 

## 2020-12-28 NOTE — Telephone Encounter (Signed)
Description   INR was 3.1 today (goal is 2.0 to 3.0)   Hold today's dose (12/28/20),  then continue 4 mg to Monday and Friday and then 2 mg daily.

## 2020-12-28 NOTE — Telephone Encounter (Signed)
Fax received mdINR PT/INR self testing service Test date/time 12/28/20 100 pm INR 3.1

## 2021-01-02 ENCOUNTER — Ambulatory Visit: Payer: Medicare Other | Admitting: Nurse Practitioner

## 2021-01-05 ENCOUNTER — Ambulatory Visit: Payer: Medicare Other | Admitting: Family

## 2021-01-05 ENCOUNTER — Telehealth: Payer: Self-pay | Admitting: Family

## 2021-01-05 NOTE — Telephone Encounter (Signed)
Patient aware and verbalized understanding. °

## 2021-01-05 NOTE — Telephone Encounter (Signed)
I do not see EKG. This should also be discussed by whoever ordered this test.

## 2021-01-09 DIAGNOSIS — I4821 Permanent atrial fibrillation: Secondary | ICD-10-CM | POA: Diagnosis not present

## 2021-01-09 DIAGNOSIS — Z7901 Long term (current) use of anticoagulants: Secondary | ICD-10-CM | POA: Diagnosis not present

## 2021-01-12 ENCOUNTER — Ambulatory Visit (INDEPENDENT_AMBULATORY_CARE_PROVIDER_SITE_OTHER): Payer: Medicare Other | Admitting: Family

## 2021-01-12 ENCOUNTER — Encounter: Payer: Self-pay | Admitting: Family

## 2021-01-12 VITALS — BP 136/92 | HR 59 | Temp 98.1°F | Ht 64.0 in

## 2021-01-12 DIAGNOSIS — I159 Secondary hypertension, unspecified: Secondary | ICD-10-CM | POA: Diagnosis not present

## 2021-01-12 DIAGNOSIS — K219 Gastro-esophageal reflux disease without esophagitis: Secondary | ICD-10-CM | POA: Diagnosis not present

## 2021-01-12 DIAGNOSIS — M1990 Unspecified osteoarthritis, unspecified site: Secondary | ICD-10-CM | POA: Diagnosis not present

## 2021-01-12 DIAGNOSIS — E039 Hypothyroidism, unspecified: Secondary | ICD-10-CM | POA: Diagnosis not present

## 2021-01-12 DIAGNOSIS — Z09 Encounter for follow-up examination after completed treatment for conditions other than malignant neoplasm: Secondary | ICD-10-CM | POA: Diagnosis not present

## 2021-01-12 DIAGNOSIS — Z9049 Acquired absence of other specified parts of digestive tract: Secondary | ICD-10-CM

## 2021-01-12 DIAGNOSIS — I25119 Atherosclerotic heart disease of native coronary artery with unspecified angina pectoris: Secondary | ICD-10-CM

## 2021-01-12 DIAGNOSIS — Z515 Encounter for palliative care: Secondary | ICD-10-CM | POA: Diagnosis not present

## 2021-01-12 DIAGNOSIS — M0579 Rheumatoid arthritis with rheumatoid factor of multiple sites without organ or systems involvement: Secondary | ICD-10-CM | POA: Diagnosis not present

## 2021-01-12 MED ORDER — OMEPRAZOLE 20 MG PO CPDR: 20.0000 mg | DELAYED_RELEASE_CAPSULE | Freq: Every day | ORAL | 1 refills | Status: DC

## 2021-01-12 MED ORDER — CETIRIZINE HCL 10 MG PO TABS: 10.0000 mg | ORAL_TABLET | Freq: Every day | ORAL | 11 refills | Status: DC

## 2021-01-12 MED ORDER — FUROSEMIDE 40 MG PO TABS: 40.0000 mg | ORAL_TABLET | Freq: Every day | ORAL | 1 refills | Status: DC

## 2021-01-12 MED ORDER — METHOCARBAMOL 750 MG PO TABS: 750.0000 mg | ORAL_TABLET | Freq: Four times a day (QID) | ORAL | 2 refills | Status: DC

## 2021-01-12 MED ORDER — LEVOTHYROXINE SODIUM 50 MCG PO TABS: 100.0000 ug | ORAL_TABLET | Freq: Every morning | ORAL | 3 refills | Status: DC

## 2021-01-12 MED ORDER — HYDROCORT-PRAMOXINE (PERIANAL) 2.5-1 % EX CREA: 1.0000 "application " | TOPICAL_CREAM | CUTANEOUS | 6 refills | Status: DC

## 2021-01-12 MED ORDER — OXYCODONE-ACETAMINOPHEN 7.5-325 MG PO TABS: 1.0000 | ORAL_TABLET | ORAL | 0 refills | Status: DC | PRN

## 2021-01-12 NOTE — Progress Notes (Signed)
Subjective:    Patient ID: Angela Wong, female    DOB: [birth year], 80 y.o.   MRN: [id number]  Chief Complaint  Patient presents with   Hospitalization Follow-up   Pt presents to the office today for hospital follow up. She went to the ED on 10/17/20 with chest pain and ended up having a laparoscopic cholecystectomy. She was discharged on 10/28/20 to a SNF. She was discharged from SNF two weeks ago and has a caregiver 24 hours with her.  Abdominal Pain This is a chronic problem. The current episode started more than 1 year ago. The pain is located in the generalized abdominal region. The pain is moderate. The quality of the pain is cramping.  Arthritis Presents for follow-up visit. She complains of pain and stiffness. The symptoms have been stable. Affected locations include the right knee, left knee, left MCP, right MCP and neck. Her pain is at a severity of 8/10.  Hypertension This is a chronic problem. The current episode started more than 1 year ago. The problem has been waxing and waning since onset. The problem is uncontrolled. Associated symptoms include malaise/fatigue, peripheral edema and shortness of breath.     Review of Systems  Constitutional:  Positive for malaise/fatigue.  Respiratory:  Positive for shortness of breath.   Gastrointestinal:  Positive for abdominal pain.  Musculoskeletal:  Positive for arthritis and stiffness.      Objective:   Physical Exam Vitals reviewed.  Constitutional:      General: She is not in acute distress.    Appearance: She is well-developed.  HENT:     Head: Normocephalic and atraumatic.     Right Ear: Tympanic membrane normal.     Left Ear: Tympanic membrane normal.  Eyes:     Pupils: Pupils are equal, round, and reactive to light.  Neck:     Thyroid: No thyromegaly.  Cardiovascular:     Rate and Rhythm: Normal rate and regular rhythm.     Heart sounds: Normal heart sounds. No murmur heard. Pulmonary:     Effort: Pulmonary  effort is normal. No respiratory distress.     Breath sounds: Normal breath sounds. No wheezing.  Abdominal:     General: Bowel sounds are normal. There is no distension.     Palpations: Abdomen is soft.     Tenderness: There is no abdominal tenderness.  Musculoskeletal:        General: No tenderness. Normal range of motion.     Cervical back: Normal range of motion and neck supple.     Right lower leg: Edema (trace in feet) present.     Left lower leg: Edema (trace feet) present.     Comments: Lower discoloration of bilateral legs  Skin:    General: Skin is warm and dry.  Neurological:     Mental Status: She is alert and oriented to person, place, and time.     Cranial Nerves: No cranial nerve deficit.     Motor: Weakness present.     Gait: Gait abnormal.     Deep Tendon Reflexes: Reflexes are normal and symmetric.  Psychiatric:        Behavior: Behavior normal.        Thought Content: Thought content normal.        Judgment: Judgment normal.      BP (!) 136/92    Pulse (!) 59    Temp 98.1 F (36.7 C) (Temporal)    Ht _0  (1.626 m)  BMI 22.49 kg/m      Assessment & Plan:  DAIRA HINE comes in today with chief complaint of Hospitalization Follow-up   Diagnosis and orders addressed:  1. Secondary hypertension - CMP14+EGFR - CBC with Differential/Platelet  2. Gastroesophageal reflux disease without esophagitis - CMP14+EGFR - CBC with Differential/Platelet - omeprazole (PRILOSEC) 20 MG capsule; Take 1 capsule (20 mg total) by mouth daily.  Dispense: 90 capsule; Refill: 1  3. Hospital discharge follow-up - CMP14+EGFR - CBC with Differential/Platelet  4. Hx of cholecystectomy - CMP14+EGFR - CBC with Differential/Platelet  5. Rheumatoid arthritis involving multiple sites with positive rheumatoid factor (HCC) - oxyCODONE-acetaminophen (PERCOCET) 7.5-325 MG tablet; Take 1 tablet by mouth every 4 (four) hours as needed for severe pain.  Dispense: 180 tablet;  Refill: 0 - methocarbamol (ROBAXIN) 750 MG tablet; Take 1 tablet (750 mg total) by mouth 4 (four) times daily.  Dispense: 180 tablet; Refill: 2 - CMP14+EGFR - CBC with Differential/Platelet  6. Osteoarthritis, unspecified osteoarthritis type, unspecified site - oxyCODONE-acetaminophen (PERCOCET) 7.5-325 MG tablet; Take 1 tablet by mouth every 4 (four) hours as needed for severe pain.  Dispense: 180 tablet; Refill: 0 - methocarbamol (ROBAXIN) 750 MG tablet; Take 1 tablet (750 mg total) by mouth 4 (four) times daily.  Dispense: 180 tablet; Refill: 2 - CMP14+EGFR - CBC with Differential/Platelet  7. Palliative care by specialist  - oxyCODONE-acetaminophen (PERCOCET) 7.5-325 MG tablet; Take 1 tablet by mouth every 4 (four) hours as needed for severe pain.  Dispense: 180 tablet; Refill: 0 - methocarbamol (ROBAXIN) 750 MG tablet; Take 1 tablet (750 mg total) by mouth 4 (four) times daily.  Dispense: 180 tablet; Refill: 2 - CMP14+EGFR - CBC with Differential/Platelet  8. Hypothyroidism, unspecified type - CMP14+EGFR - CBC with Differential/Platelet - TSH - levothyroxine (EUTHYROX) 50 MCG tablet; Take 2 tablets (100 mcg total) by mouth every morning.  Dispense: 180 tablet; Refill: 3   Labs pending Keep follow up with Palliative  Health Maintenance reviewed Diet and exercise encouraged  Follow up plan: 3 months   [name], FNP

## 2021-01-12 NOTE — Patient Instructions (Signed)
Gastroesophageal Reflux Disease, Adult Gastroesophageal reflux (GER) happens when acid from the stomach flows up into the tube that connects the mouth and the stomach (esophagus). Normally, food travels down the esophagus and stays in the stomach to be digested. With GER, food and stomach acid sometimes move back up into the esophagus. You may have a disease called gastroesophageal reflux disease (GERD) if the reflux: Happens often. Causes frequent or very bad symptoms. Causes problems such as damage to the esophagus. When this happens, the esophagus becomes sore and swollen. Over time, GERD can make small holes (ulcers) in the lining of the esophagus. What are the causes? This condition is caused by a problem with the muscle between the esophagus and the stomach. When this muscle is weak or not normal, it does not close properly to keep food and acid from coming back up from the stomach. The muscle can be weak because of: Tobacco use. Pregnancy. Having a certain type of hernia (hiatal hernia). Alcohol use. Certain foods and drinks, such as coffee, chocolate, onions, and peppermint. What increases the risk? Being overweight. Having a disease that affects your connective tissue. Taking NSAIDs, such a ibuprofen. What are the signs or symptoms? Heartburn. Difficult or painful swallowing. The feeling of having a lump in the throat. A bitter taste in the mouth. Bad breath. Having a lot of saliva. Having an upset or bloated stomach. Burping. Chest pain. Different conditions can cause chest pain. Make sure you see your doctor if you have chest pain. Shortness of breath or wheezing. A long-term cough or a cough at night. Wearing away of the surface of teeth (tooth enamel). Weight loss. How is this treated? Making changes to your diet. Taking medicine. Having surgery. Treatment will depend on how bad your symptoms are. Follow these instructions at home: Eating and drinking  Follow a  diet as told by your doctor. You may need to avoid foods and drinks such as: Coffee and tea, with or without caffeine. Drinks that contain alcohol. Energy drinks and sports drinks. Bubbly (carbonated) drinks or sodas. Chocolate and cocoa. Peppermint and mint flavorings. Garlic and onions. Horseradish. Spicy and acidic foods. These include peppers, chili powder, curry powder, vinegar, hot sauces, and BBQ sauce. Citrus fruit juices and citrus fruits, such as oranges, lemons, and limes. Tomato-based foods. These include red sauce, chili, salsa, and pizza with red sauce. Fried and fatty foods. These include donuts, french fries, potato chips, and high-fat dressings. High-fat meats. These include hot dogs, rib eye steak, sausage, ham, and bacon. High-fat dairy items, such as whole milk, butter, and cream cheese. Eat small meals often. Avoid eating large meals. Avoid drinking large amounts of liquid with your meals. Avoid eating meals during the 2-3 hours before bedtime. Avoid lying down right after you eat. Do not exercise right after you eat. Lifestyle  Do not smoke or use any products that contain nicotine or tobacco. If you need help quitting, ask your doctor. Try to lower your stress. If you need help doing this, ask your doctor. If you are overweight, lose an amount of weight that is healthy for you. Ask your doctor about a safe weight loss goal. General instructions Pay attention to any changes in your symptoms. Take over-the-counter and prescription medicines only as told by your doctor. Do not take aspirin, ibuprofen, or other NSAIDs unless your doctor says it is okay. Wear loose clothes. Do not wear anything tight around your waist. Raise (elevate) the head of your bed about 6  inches (15 cm). You may need to use a wedge to do this. Avoid bending over if this makes your symptoms worse. Keep all follow-up visits. Contact a doctor if: You have new symptoms. You lose weight and you  do not know why. You have trouble swallowing or it hurts to swallow. You have wheezing or a cough that keeps happening. You have a hoarse voice. Your symptoms do not get better with treatment. Get help right away if: You have sudden pain in your arms, neck, jaw, teeth, or back. You suddenly feel sweaty, dizzy, or light-headed. You have chest pain or shortness of breath. You vomit and the vomit is green, yellow, or black, or it looks like blood or coffee grounds. You faint. Your poop (stool) is red, bloody, or black. You cannot swallow, drink, or eat. These symptoms may represent a serious problem that is an emergency. Do not wait to see if the symptoms will go away. Get medical help right away. Call your local emergency services (911 in the [place] Do not drive yourself to the hospital. Summary If a person has gastroesophageal reflux disease (GERD), food and stomach acid move back up into the esophagus and cause symptoms or problems such as damage to the esophagus. Treatment will depend on how bad your symptoms are. Follow a diet as told by your doctor. Take all medicines only as told by your doctor. This information is not intended to replace advice given to you by your health care provider. Make sure you discuss any questions you have with your health care provider. Document Revised: 07/12/2019 Document Reviewed: 07/12/2019 Elsevier Patient Education  [place].

## 2021-01-13 LAB — CMP14+EGFR
ALT: 16 IU/L (ref 0–32)
AST: 28 IU/L (ref 0–40)
Albumin/Globulin Ratio: 1.8 (ref 1.2–2.2)
Albumin: 4 g/dL (ref 3.6–4.6)
Alkaline Phosphatase: 152 IU/L — ABNORMAL HIGH (ref 44–121)
BUN/Creatinine Ratio: 15 (ref 12–28)
BUN: 13 mg/dL (ref 8–27)
Bilirubin Total: 0.7 mg/dL (ref 0.0–1.2)
CO2: 24 mmol/L (ref 20–29)
Calcium: 9 mg/dL (ref 8.7–10.3)
Chloride: 99 mmol/L (ref 96–106)
Creatinine, Ser: 0.85 mg/dL (ref 0.57–1.00)
Globulin, Total: 2.2 g/dL (ref 1.5–4.5)
Glucose: 127 mg/dL — ABNORMAL HIGH (ref 70–99)
Potassium: 3.7 mmol/L (ref 3.5–5.2)
Sodium: 138 mmol/L (ref 134–144)
Total Protein: 6.2 g/dL (ref 6.0–8.5)
eGFR: 69 mL/min/{1.73_m2} (ref >59)

## 2021-01-13 LAB — CBC WITH DIFFERENTIAL/PLATELET
Basophils Absolute: 0.1 10*3/uL (ref 0.0–0.2)
Basos: 1 %
EOS (ABSOLUTE): 0.1 10*3/uL (ref 0.0–0.4)
Eos: 2 %
Hematocrit: 44.6 % (ref 34.0–46.6)
Hemoglobin: 15 g/dL (ref 11.1–15.9)
Immature Grans (Abs): 0 10*3/uL (ref 0.0–0.1)
Immature Granulocytes: 0 %
Lymphocytes Absolute: 1.3 10*3/uL (ref 0.7–3.1)
Lymphs: 23 %
MCH: 30.7 pg (ref 26.6–33.0)
MCHC: 33.6 g/dL (ref 31.5–35.7)
MCV: 91 fL (ref 79–97)
Monocytes Absolute: 0.3 10*3/uL (ref 0.1–0.9)
Monocytes: 5 %
Neutrophils Absolute: 3.8 10*3/uL (ref 1.4–7.0)
Neutrophils: 69 %
Platelets: 160 10*3/uL (ref 150–450)
RBC: 4.89 x10E6/uL (ref 3.77–5.28)
RDW: 13.2 % (ref 11.7–15.4)
WBC: 5.5 10*3/uL (ref 3.4–10.8)

## 2021-01-13 LAB — TSH: TSH: 2.75 u[IU]/mL (ref 0.450–4.500)

## 2021-01-16 ENCOUNTER — Telehealth: Payer: Self-pay | Admitting: Family

## 2021-01-16 DIAGNOSIS — I4821 Permanent atrial fibrillation: Secondary | ICD-10-CM

## 2021-01-16 NOTE — Telephone Encounter (Signed)
Pt has been informed and understood. 

## 2021-01-16 NOTE — Telephone Encounter (Signed)
Fax received mdINR PT/INR self testing service Test date/time 01/16/2021 9:57am INR 3.6

## 2021-01-16 NOTE — Telephone Encounter (Signed)
Description   INR was 3.6 today (goal is 2.0 to 3.0)   Hold today's dose (01/16/21),  then decrease to  2 mg daily.   Recheck in 1 week

## 2021-01-17 ENCOUNTER — Telehealth: Payer: Self-pay | Admitting: Family

## 2021-01-17 NOTE — Telephone Encounter (Signed)
Patient declined the Medicare Wellness Visit with [facility]  due to her son not doing well.  Asked to holf off until next year

## 2021-01-22 ENCOUNTER — Ambulatory Visit (INDEPENDENT_AMBULATORY_CARE_PROVIDER_SITE_OTHER): Payer: Medicare Other | Admitting: *Deleted

## 2021-01-22 VITALS — BP 120/80 | Wt 131.0 lb

## 2021-01-22 DIAGNOSIS — Z0000 Encounter for general adult medical examination without abnormal findings: Secondary | ICD-10-CM

## 2021-01-22 NOTE — Progress Notes (Signed)
MEDICARE ANNUAL WELLNESS VISIT  01/22/2021  Telephone Visit Disclaimer This Medicare AWV was conducted by telephone due to national recommendations for restrictions regarding the COVID-19 Pandemic (e.g. social distancing).  I verified, using two identifiers, that I am speaking with Angela Wong or their authorized healthcare agent. I discussed the limitations, risks, security, and privacy concerns of performing an evaluation and management service by telephone and the potential availability of an in-person appointment in the future. The patient expressed understanding and agreed to proceed.  Location of Patient: in her home   Location of Provider (nurse):  in office   Subjective:    Angela Wong is a 81 y.o. female patient of [facility], [facility], [place] who had a Medicare Annual Wellness Visit today via telephone. Angela Wong is Retired and Disabled and lives alone. she has 3 children. she reports that she is socially active and does interact with friends/family regularly. she is not physically active and enjoys reading.  Patient Care Team: [name], FNP as PCP - General (Family Medicine) Minus Breeding, MD as PCP - Cardiology (Cardiology) Pyrtle, Lajuan Lines, MD as Consulting Physician (Gastroenterology) Lahoma Rocker, MD as Consulting Physician (Rheumatology) Gaynelle Arabian, MD as Consulting Physician (Orthopedic Surgery) Jarome Matin, MD as Consulting Physician (Dermatology) Penni Bombard, MD as Consulting Physician (Neurology) Ernst Bowler Gwenith Daily, MD as Consulting Physician (Allergy and Immunology) Ilean China, RN as Case Manager  Advanced Directives 01/22/2021 10/23/2020 10/17/2020 10/07/2020 07/01/2019 06/29/2019 06/28/2019  Does Patient Have a Medical Advance Directive? Yes No No No Yes Yes Yes  Type of Advance Directive [place] (No Data) (No Data)  Does patient want to make changes to medical advance directive? No -  Patient declined - - - No - Patient declined No - Patient declined No - Patient declined  Copy of [place] in Chart? No - copy requested - - - No - copy requested - -  Would patient like information on creating a medical advance directive? - No - Patient declined No - Patient declined No - Patient declined No - Patient declined - -    Hospital Utilization Over the Past 12 Months: # of hospitalizations or ER visits: 2 # of surgeries: 2  Review of Systems    Patient reports that her overall health is worse compared to last year.  General ROS: negative  Patient Reported Readings (BP, Pulse, CBG, Weight, etc) BP 120/80 Comment: home reading   Wt 131 lb (59.4 kg)    BMI 22.49 kg/m    Pain Assessment       Current Medications & Allergies (verified) Allergies as of 01/22/2021       Reactions   Cephalosporins Hives, Shortness Of Breath   Ciprofloxacin Hives, Shortness Of Breath   Diltiazem Shortness Of Breath   Swollen throat   Doxycycline Hives, Shortness Of Breath   Horse-derived Products Anaphylaxis   Ketek [telithromycin] Palpitations   Chest discomfort   Nitrofuran Derivatives Anaphylaxis, Hives   blisters   Nitrous Oxide Nausea And Vomiting   Severe due to Sjogrens (Auto-Immune Disease)   Other Anaphylaxis   ALLERGY TO HORSE SERUM   Penicillins Hives, Shortness Of Breath      Pentazocine Other (See Comments)   Other reaction(s): Mental Status Changes (intolerance) Altered Mental Status  Altered Mental Status  Altered Mental Status    Sulfa Antibiotics Hives, Shortness Of Breath   Trovan [alatrofloxacin] Palpitations, Other (  See Comments), Anaphylaxis   Chest pain, dizziness, irregular pulse   Vitamin B12    Confusion and shaking   Zinc Gelatin [zinc] Anaphylaxis   Amlodipine Swelling   Calcium Channel Blockers    Respiratory distress   Carvedilol Other (See Comments)   Dizziness, "joint pain, depression"   Clindamycin/lincomycin    CP, lock  jaw   Codeine Nausea Only   Cymbalta [duloxetine Hcl] Swelling   Diovan [valsartan] Swelling   Lisinopril Swelling   Omeprazole    Generic with additives    Sertraline Other (See Comments)   confusion   Tramadol Nausea Only   Loteprednol Etabonate Rash        Medication List        Accurate as of January 22, 2021  8:44 AM. If you have any questions, ask your nurse or doctor.          STOP taking these medications    fexofenadine 180 MG tablet Commonly known as: ALLEGRA   omeprazole 20 MG capsule Commonly known as: PRILOSEC       TAKE these medications    albuterol 108 (90 Base) MCG/ACT inhaler Commonly known as: VENTOLIN HFA INHALE 2 PUFFS BY MOUTH EVERY 6 HOURS AS NEEDED FOR WHEEZING What changed:  how much to take how to take this when to take this reasons to take this additional instructions   atenolol 25 MG tablet Commonly known as: TENORMIN Take 0.5 tablets (12.5 mg total) by mouth 2 (two) times daily.   cetirizine 10 MG tablet Commonly known as: ZYRTEC Take 1 tablet (10 mg total) by mouth daily.   furosemide 40 MG tablet Commonly known as: Lasix Take 1 tablet (40 mg total) by mouth daily.   hydrocortisone-pramoxine 2.5-1 % rectal cream Commonly known as: ANALPRAM-HC Place 1 application rectally See admin instructions. After each bathroom use   levothyroxine 50 MCG tablet Commonly known as: Euthyrox Take 2 tablets (100 mcg total) by mouth every morning.   methocarbamol 750 MG tablet Commonly known as: ROBAXIN Take 1 tablet (750 mg total) by mouth 4 (four) times daily.   oxyCODONE-acetaminophen 7.5-325 MG tablet Commonly known as: PERCOCET Take 1 tablet by mouth every 4 (four) hours as needed for severe pain.   predniSONE 5 MG tablet Commonly known as: DELTASONE Take 1 tablet (5 mg total) by mouth every morning.   Restasis 0.05 % ophthalmic emulsion Generic drug: cycloSPORINE INSTILL 1 DROP INTO EACH EYE TWICE DAILY What changed:  See the new instructions.   warfarin 1 MG tablet Commonly known as: COUMADIN Take as directed by the anticoagulation clinic. If you are unsure how to take this medication, talk to your nurse or doctor. Original instructions: TAKE 4MG  MONDAY, WEDNESDAY, AND FRIDAY. TAKE 2 MG TUESDAY, THURSDAY, SATURDAY, AND SUNDAY        History (reviewed): Past Medical History:  Diagnosis Date   A-fib ([place])    Asthma    Cerebral vasculitis    Congestive dilated cardiomyopathy (HCC)    COPD (chronic obstructive pulmonary disease) (HCC)    Fibromyalgia    Gastric polyp    GERD (gastroesophageal reflux disease)    Hiatal hernia    Hyperparathyroidism (HCC)    IBS (irritable bowel syndrome)    MVP (mitral valve prolapse)    Osteoarthritis    Ovarian cancer (HCC)    lymph node removal with hysterectomy   Raynaud's disease    RLS (restless legs syndrome)    Situational depression    Sjogren's syndrome (  [place])    Vasculitis ([place])    Vitamin D deficiency    Past Surgical History:  Procedure Laterality Date   ABDOMINAL HYSTERECTOMY  1982   with right oophorectomy   APPENDECTOMY     BREAST BIOPSY Right    x 2   BREAST SURGERY     Biopsy   CARPAL TUNNEL RELEASE Right 1980   CARPAL TUNNEL RELEASE Left 2010   x 2   CATARACT EXTRACTION Bilateral    CESAREAN SECTION     x 3   CHOLECYSTECTOMY N/A 10/23/2020   Procedure: LAPAROSCOPIC CHOLECYSTECTOMY;  Surgeon: [name] Signs, MD;  Location: [facility];  Service: General;  Laterality: N/A;   ENDOSCOPIC RETROGRADE CHOLANGIOPANCREATOGRAPHY (ERCP) WITH PROPOFOL N/A 10/19/2020   Procedure: ENDOSCOPIC RETROGRADE CHOLANGIOPANCREATOGRAPHY (ERCP) WITH PROPOFOL;  Surgeon: Rogene Houston, MD;  Location: [facility];  Service: Endoscopy;  Laterality: N/A;   ESOPHAGEAL DILATION N/A 10/19/2020   Procedure: ESOPHAGEAL DILATION;  Surgeon: Rogene Houston, MD;  Location: [facility];  Service: Endoscopy;  Laterality: N/A;   ESOPHAGOGASTRODUODENOSCOPY (EGD) WITH PROPOFOL N/A  10/19/2020   Procedure: ESOPHAGOGASTRODUODENOSCOPY (EGD) WITH PROPOFOL;  Surgeon: Rogene Houston, MD;  Location: [facility];  Service: Endoscopy;  Laterality: N/A;   KNEE SURGERY Left 2005   LEFT HEART CATH AND CORONARY ANGIOGRAPHY N/A 07/02/2019   Procedure: LEFT HEART CATH AND CORONARY ANGIOGRAPHY;  Surgeon: Jettie Booze, MD;  Location: [facility];  Service: Cardiovascular;  Laterality: N/A;   OOPHORECTOMY Left [place]  09/15/2014   REFRACTIVE SURGERY Bilateral 2014   SPHINCTEROTOMY N/A 10/19/2020   Procedure: SPHINCTEROTOMY;  Surgeon: Rogene Houston, MD;  Location: [facility];  Service: Endoscopy;  Laterality: N/A;   STONE EXTRACTION WITH BASKET N/A 10/19/2020   Procedure: STONE EXTRACTION WITH BASKET;  Surgeon: Rogene Houston, MD;  Location: [facility];  Service: Endoscopy;  Laterality: N/A;   TUBAL LIGATION     Family History  Problem Relation Age of Onset   Allergies Mother    COPD Mother    Breast cancer Mother    Allergic rhinitis Mother    Heart disease Father        No details   Kidney disease Father    Allergic rhinitis Father    Stroke Sister    Arthritis/Rheumatoid Sister    Asthma Sister    Lupus Sister    Heart attack Sister    Allergies Sister    Breast cancer Maternal Aunt        x 2   Stroke Paternal Grandmother    Scleroderma Grandchild    Thyroid disease Other    Social History   Socioeconomic History   Marital status: Single    Spouse name: Not on file   Number of children: Not on file   Years of education: Not on file   Highest education level: Not on file  Occupational History   Not on file  Tobacco Use   Smoking status: Never   Smokeless tobacco: Never  Vaping Use   Vaping Use: Never used  Substance and Sexual Activity   Alcohol use: No   Drug use: No   Sexual activity: Not on file  Other Topics Concern   Not on file  Social History Narrative   Lives alone.  Moved from [place].     Caffeine- 6 cups daily, mix of  caffeine/decaf   Children- 3   Retired Therapist, sports   Social Determinants of [employer]  Resource Strain: Not on file  Food Insecurity: Not on file  Transportation Needs: Not on file  Physical Activity: Not on file  Stress: Not on file  Social Connections: Not on file    Activities of Daily Living In your present state of health, do you have any difficulty performing the following activities: 01/22/2021 10/18/2020  Hearing? N N  Vision? Y N  Comment glasses -RX -  Difficulty concentrating or making decisions? Y N  Walking or climbing stairs? Y Y  Dressing or bathing? Y Y  Doing errands, shopping? N N  Preparing Food and eating ? N -  Using the Toilet? N -  In the past six months, have you accidently leaked urine? Y -  Do you have problems with loss of bowel control? N -  Managing your Medications? N -  Managing your Finances? N -  Housekeeping or managing your Housekeeping? N -  Some recent data might be hidden    Patient Education/ Literacy    Exercise Current Exercise Habits: The patient does not participate in regular exercise at present, Exercise limited by: orthopedic condition(s)  Diet Patient reports consuming 2 meals a day and 2 snack(s) a day Patient reports that her primary diet is: Regular Patient reports that she does have regular access to food.   Depression Screen PHQ 2/9 Scores 01/22/2021 09/29/2020 11/23/2018 03/16/2018 03/12/2018 01/22/2018 12/18/2017  PHQ - 2 Score 1 5 0 1 1 1 2   PHQ- 9 Score - 10 - - - - 8     Fall Risk Fall Risk  01/22/2021 01/12/2021 09/29/2020 11/23/2018 03/16/2018  Falls in the past year? 1 1 1  0 1  Comment - - - - -  Number falls in past yr: 0 0 1 - 1  Injury with Fall? 1 1 0 - 1  Comment - - - - -  Risk Factor Category  - - - - -  Risk for fall due to : History of fall(s) History of fall(s) History of fall(s);Impaired balance/gait;Impaired mobility - -  Follow up Falls evaluation completed Education provided Falls prevention discussed - -   Comment - - - - -     Objective:  Angela Wong seemed alert and oriented and she participated appropriately during our telephone visit.  Blood Pressure Weight BMI  BP Readings from Last 3 Encounters:  01/22/21 120/80  01/12/21 (!) 136/92  10/30/20 118/60   Wt Readings from Last 3 Encounters:  01/22/21 131 lb (59.4 kg)  10/30/20 131 lb (59.4 kg)  10/25/20 130 lb 15.3 oz (59.4 kg)   BMI Readings from Last 1 Encounters:  01/22/21 22.49 kg/m    *Unable to obtain current vital signs, weight, and BMI due to telephone visit type  Hearing/Vision  Stacee did not seem to have difficulty with hearing/understanding during the telephone conversation Reports that she has not had a formal eye exam by an eye care professional within the past year Reports that she has not had a formal hearing evaluation within the past year *Unable to fully assess hearing and vision during telephone visit type  Cognitive Function: 6CIT Screen 01/22/2021  What Year? 0 points  What month? 0 points  What time? 0 points  Count back from 20 0 points  Months in reverse 0 points  Repeat phrase 0 points  Total Score 0   (Normal:0-7, Significant for Dysfunction: >8)  Normal Cognitive Function Screening: Yes   Immunization & Health Maintenance Record Immunization History  Administered Date(s) Administered  Fluad Quad(high Dose 65+) 11/23/2018   Influenza, High Dose Seasonal PF 10/09/2016, 10/27/2017   Influenza-Unspecified 10/14/2012, 10/29/2013, 10/15/2015   PFIZER(Purple Top)SARS-COV-2 Vaccination 10/04/2019, 12/02/2019   Pneumococcal Polysaccharide-23 10/29/2013   Td 07/24/2009    Health Maintenance  Topic Date Due   Zoster Vaccines- Shingrix (1 of 2) Never done   Pneumonia Vaccine 64+ Years old (2 - PCV) 10/30/2014   MAMMOGRAM  10/14/2018   COVID-19 Vaccine (3 - Pfizer risk series) 02/07/2021 (Originally 12/30/2019)   INFLUENZA VACCINE  04/13/2021 (Originally 08/14/2020)   TETANUS/TDAP   09/29/2021 (Originally 07/25/2019)   DEXA SCAN  Completed   HPV VACCINES  Aged Out       Assessment  This is a routine wellness examination for [employer].  Health Maintenance: Due or Overdue Health Maintenance Due  Topic Date Due   Zoster Vaccines- Shingrix (1 of 2) Never done   Pneumonia Vaccine 74+ Years old (2 - PCV) 10/30/2014   MAMMOGRAM  10/14/2018    Angela Wong does not need a referral for Community Assistance: Care Management:   no Social Work:    no Prescription Assistance:  no Nutrition/Diabetes Education:  no   Plan:  Personalized Goals  Goals Addressed             This Visit's Progress    Prevent falls         Personalized Health Maintenance & Screening Recommendations  Pneumococcal vaccine   Lung Cancer Screening Recommended: no (Low Dose CT Chest recommended if Age 74-80 years, 30 pack-year currently smoking OR have quit w/in past 15 years) Hepatitis C Screening recommended: no HIV Screening recommended: no  Advanced Directives: Written information was not prepared per patient's request.  Referrals & Orders No orders of the defined types were placed in this encounter.   Follow-up Plan Follow-up with [name], FNP as planned Keep follow up with Almyra Free, clinical pharm to discuss meds and additives that are causing her issues   I have personally reviewed and noted the following in the patients chart:   Medical and social history Use of alcohol, tobacco or illicit drugs  Current medications and supplements Functional ability and status Nutritional status Physical activity Advanced directives List of other physicians Hospitalizations, surgeries, and ER visits in previous 12 months Vitals Screenings to include cognitive, depression, and falls Referrals and appointments  In addition, I have reviewed and discussed with Angela Wong certain preventive protocols, quality metrics, and best practice recommendations. A written  personalized care plan for preventive services as well as general preventive health recommendations is available and can be mailed to the patient at her request.      [place] Dec  01/22/2021

## 2021-01-22 NOTE — Patient Instructions (Signed)
Angela Wong , Thank you for taking time to come for your Medicare Wellness Visit. I appreciate your ongoing commitment to your health goals. Please review the following plan we discussed and let me know if I can assist you in the future.   These are the goals we discussed:  Goals       "I need help with Transportation" (pt-stated)      CARE PLAN ENTRY (see longitudinal plan of care for additional care plan information) Transportation assistance needs in a patient with Fibromyalgia, generalized weakness, Rheumatoid arthritis,osteoarthritis,Depression, Raynaud's disease, Sjogren's syndrome, afib, asthma, and hypothyroidism  Current Barriers:  Knowledge Deficits related to transportation assistance Lacks caregiver support.  Film/video editor.  Transportation barriers  Nurse Case Manager Clinical Goal(s):  Over the next 10 days, patient will talk with [place] regarding transportation assistance  Interventions:  Inter-disciplinary care team collaboration (see longitudinal plan of care) Chart reviewed including recent office notes and lab results Previously consulted by Victorino December, NP with [place] [facility] Talked with patient by telephone Discussed transportation limitations Discussed family involvement They assist with transportation when able but they aren't always available Discussed physical mobility limitations Discussed use of lightweight wheelchair and walker Discussed generalized weakness and patient's fear of falling Discussed upcoming appointments Referral to Care Guide for transportation assistance and patient has been contacted Previously provided with RNCM contact information and encouraged to reach out as needed  Patient Self Care Activities:  Performs most ADLs independently with some assistance Unable to independently drive  Please see past updates related to this goal by clicking on the "Past Updates" button in the  selected goal        "I need some help at home"      [place] (see longitudinal plan of care for additional care plan information)  In-home care needs in a patient with Fibromyalgia, generalized weakness, Rheumatoid arthritis,osteoarthritis,Depression, Raynaud's disease, Sjogren's syndrome, afib, asthma, and hypothyroidism, congestive dilated cardiomyopathy  Current Barriers:  Knowledge Deficits related to in home care options Financial Constraints.  Transportation barriers  Nurse Case Manager Clinical Goal(s):  Over the next 30 days, patient will talk with RN Care Manager regarding in home care options  Interventions:  Inter-disciplinary care team collaboration (see longitudinal plan of care) Chart reviewed including office notes and referral notes Referral to Hospice for palliative care services in 2/21. Documented that she was enrolled in the Supportive Care Program Previously collaborated with PCP office to send referral to [facility] for [facility] Previously discussed private pay in-home care options Previously discussed limitations to performing ADLs independently Uses a walker but is concerned about leg weakness and falling Worries about stepping in and out of the shower. Has a walkin shower with a shower seat. Does not have a handheld shower head.  Typically showers when he granddaughter is there so that she can help her in and out Would like some help with cleaning and food preparation. Granddaugter tries to help but isn't as efficient as [name] would like Previously provided with CCM contact information and encouraged to reach out as needed Received VM message from Arvil Persons, NP with [place] [phone] She did a home assessment with [facility] She is going out to PCP and recommend [facility] services and discuss pain management Returned Kimberly's call and left her a message to return my  call  Patient Self Care Activities:  Performs ADLs with some assistance Unable  to independently drive  Please see past updates related to this goal by clicking on the "Past Updates" button in the selected goal        "I need to get my lab results" (pt-stated)      [place] (see longitudinal plan of care for additional care plan information)  Current Barriers:  Care Coordination needs related to lab results in a patient with Afib, HTN, HLD, heart failure, and RA (disease states)  Nurse Case Manager Clinical Goal(s):  Over the next 3 days, patient will talk with [facility] clinical staff regarding lab results and provider recommendations  Interventions:  Inter-disciplinary care team collaboration (see longitudinal plan of care) Chart reviewed including recent office notes and lab results Labs ordered by PCP during video visit on 08/05/19 but not been resulted Talked with patient by telephone [facility] drew blood on 08/13/19 for ordered lab tests Patient reported INR results to [place] through [place] yesterday She has not gotten a call about either of these Collaborated with [facility] clinical staff regarding results from [place] and [place]. Results have not came across the fax Contacted [facility] at [phone] Given verbal report that INR was 1.9 yesterday They faxed results over to PCP office while I was on the phone Confirmed with [facility], LPN that results were received and entered into EMR and forwarded to PCP covering provider to be addressed today Contacted [place] at [phone] and requested that they fax results to PCP Advised PCP's nurse, Brynda Peon, CMA that results should be coming over today Advised patient that PCP is off today but the results have been requested and she should get a call within the next day or two Encouraged patient to reach out to PCP office if she has not received results over the next 2 days Previously provided with RNCM contact number  and encouraged to reach out as needed  Patient Self Care Activities:  Performs ADL's independently Does not drive  Initial goal documentation'       "I need to schedule a mammogram" (pt-stated)      CARE PLAN ENTRY (see longitudinal plan of care for additional care plan information)  Current Barriers:  Care Coordination needs related to breast cancer screening in a patient with rheumatoid arthritis and a history of abnormal mammogram (disease states) Transportation barriers  Nurse Case Manager Clinical Goal(s):  Over the next 10 days, patient will be scheduled for a bilateral diagnostic mammogram  Interventions:  Inter-disciplinary care team collaboration (see longitudinal plan of care) Chart reviewed including most recent mammogram report Previously discussed that patient is overdue for a left diagnostic mammogram and is also due for a right screening Previously discussed current symptoms. Patient complains of some right breast pain. Confirmed that breast imaging has been scheduled Collaborated with Care Guide RE: transportation to appointment Provided with CCM contact information and encouraged to reach out as needed  Patient Self Care Activities:  Performs ADL's independently Unable to independently drive  Please see past updates related to this goal by clicking on the "Past Updates" button in the selected goal        "I need to see a podiatrist" (pt-stated)      [place] (see longitudinal plan of care for additional care plan information)  Current Barriers:  Care Coordination needs related to foot care in a patient with rheumatoid arthritis and fibromyalgia (disease states) Transportation barriers  Nurse Case Manager Clinical Goal(s):  Over the next 14 days, patient will have appointment with  podiatrist scheduled  Interventions:  Inter-disciplinary care team collaboration (see longitudinal plan of care) Chart reviewed Talked with patient by  telephone Collaborated with referral coordinator Confirmed that appt for podiatry has been scheduled and patient has appt information Arranged for Care Guide to call patient regarding transportation to appt. She already has transportation arranged for other doctor's appointments Provided with CCM contact information and encouraged to reach out as needed  Patient Self Care Activities:  Performs ADL's independently Unable to independently drive  Please see past updates related to this goal by clicking on the "Past Updates" button in the selected goal        "I want the swelling in my legs to get better" (pt-stated)      [place] (see longitudinal plan of care for additional care plan information)  Current Barriers:  Care Coordination needs related to lower extremity edema in a patient with congestive dilated cardiomyopathy (disease states) Transportation Physical debility/weakness  Nurse Case Manager Clinical Goal(s):  Over the next 30 days, patient will work with Consulting civil engineer to address needs related to lower extremity edema Over the next 30 days, patient will schedule an appointment with her cardiologist   Interventions:  Inter-disciplinary care team collaboration (see longitudinal plan of care) Chart reviewed including recent office notes, telephone notes, and lab results Talked with patient by telephone Reviewed and discussed medications: Bumex dose and instructions Was directed to take Bumex 1.5mg  daily for 3 days for increased edema and then return to normal dose of alternating between 1mg  and .5mg  daily Updated medication list Collaborated with PCP regarding Bumex dosage for managing edema Patient reports that increasing dose for 3 days helped but did note resolve the edema PCP recommended to increase to 1mg  daily instead of alternating between .5mg  and 1mg  daily Requested that PCP send in new script for 1mg  daily so that she doesn't run out early Advised patient  to reach out to PCP with any new or worsening symptoms Provided with RNCM contact number and encouraged to reach out as needed Advised patient to schedule follow-up appt with cardiologist as previously recommended Discussed physical limitations and transportation concerns  Patient Self Care Activities:  Self administers medications as prescribed Attends all scheduled provider appointments Calls provider office for new concerns or questions Unable to independently drive  Initial goal documentation       "I want to control my pain" (pt-stated)      CARE PLAN ENTRY (see longitudinal plan of care for additional care plan information)  Current Barriers:  Care Coordination needs related to pain in a patient with RA, OA, and fibromyalgia (disease states) Transportation Physical debility/weakness Lives at home alone Limited family support  Nurse Case Manager Clinical Goal(s):  Over the next 30 days, patient will work with Consulting civil engineer to address needs related to pain management Over the next 30 days, patient will work with PCP to manage pain  Interventions:  Inter-disciplinary care team collaboration (see longitudinal plan of care) Chart reviewed including recent office notes and telephone notes Medications reviewed and discussed: Patient is taking baclofen 5mg  TID but it was removed from her med list on 06/10/19. She doesn't feel that it helps and is believes it may be contributing to her weakness/fatigue. Her oxycodone was increased from 2.5mg  to 5mg  but she does not take that unless someone is going to be at home with her for an extended amount of time because she is already unsteady on her feet Collaborated with PCP regarding  medications She should d/c muscle relaxers since she is being prescribed Oxycodone for pain Discussed recommendations with patient. She reports that she hasn't taken Oxycodone since the dose was increased and that she needs a muscle relaxer to control the  spasms. The baclofen doesn't completley control them but she thinks that it may be helping some.  Recommended a f/u appointment with PCP to discuss further Sent telephone message to [facility] clinical staff requesting that they contact patient to schedule a visit with [name], FNP this week Provided with RNCM contact information and encouraged to reach out as needed  Patient Self Care Activities:  Self administers medications as prescribed Calls provider office for new concerns or questions Unable to independently drive  Initial goal documentation       AFIB (pt-stated)      Current Barriers:  Unable to achieve control of INR GOAL  Suboptimal therapeutic regimen for other conditions -- patient not taking most other medications  Pharmacist Clinical Goal(s):  Over the next 90 days, patient will achieve control of INR as evidenced by GOAL INR 2-3 through collaboration with PharmD and provider.    Interventions: 1:1 collaboration with [name], FNP regarding development and update of comprehensive plan of care as evidenced by provider attestation and co-signature Inter-disciplinary care team collaboration (see longitudinal plan of care) Comprehensive medication review performed; medication list updated in electronic medical record  Atrial Fibrillation: Uncontrolled; current rate/rhythm control; ATENOLOL (1/2 PILL PER CARDS-PATIENT REPORTS NOT TOLERATING ANYTHING ELSE anticoagulant treatment: WARFARIN CHADS2VASc score: 5 Home blood pressure, heart rate readings: WNL--SEEN BY PALLIATIVE NP AT HOME FREQUENTLY Educated on [place].  PATIENT GETS HOME INR CHECKS EVERY Tuesday.  HER LAST INR WAS 2.9 (CONTINUE CURRENT MANAGEMENT) Recommended CONTINUE WARFARIN, DIETARY CONSISTENCY   Patient Goals/Self-Care Activities Over the next 90 days, patient will:  - take medications as prescribed focus on  medication adherence  Follow Up Plan: Telephone follow up appointment with care management team member scheduled for: 1 MONTH       Client will talk with LCSW in next 30 days to discuss stress/anxiety related to managing her health needs (pt-stated)      Current Barriers:  Stress issues of client with chronic diagnoses of Atrial Fibrillation, GERD, HTN, HLD, Firbromyalgia, Depression Pain issues faced Social isolation Medication costs  Clinical Social Work Clinical Goal(s):  Over the next 30  days, [name]  Interventions: Provided counseling support for client Encouraged client to talk with [facility] regarding nursing needs of client Talked with clinet about relaxation techniques or choice (enjoys reading to help her relax) Talked with client about her social support network  Patient Self Care Activities:  Self administers medications as prescribed Attends all scheduled provider appointments Performs ADL's independently  Plan:  Attends client scheduled medical appointments Client to call RNCM to talk with RNCM about nursing needs of client LCSW to call client in next 3 weeks to talk with client about psychosocial needs of client Client to use relaxation techniques of choice to help her manage symptoms faced  Initial goal documentation       Increase physical activity (pt-stated)      Patient states her goal is continue treatments on her hip joints to help improve pain and increase her overall strength and endurance.       Prevent falls  This is a list of the screening recommended for you and due dates:  Health Maintenance  Topic Date Due   Zoster (Shingles) Vaccine (1 of 2) Never done   Pneumonia Vaccine (2 - PCV) 10/30/2014   Mammogram  10/14/2018   COVID-19 Vaccine (3 - Pfizer risk series) 02/07/2021*   Flu Shot  04/13/2021*   Tetanus Vaccine  09/29/2021*   DEXA  scan (bone density measurement)  Completed   HPV Vaccine  Aged Out  *Topic was postponed. The date shown is not the original due date.

## 2021-01-24 ENCOUNTER — Telehealth: Payer: Medicare Other

## 2021-02-05 ENCOUNTER — Other Ambulatory Visit: Payer: Self-pay | Admitting: *Deleted

## 2021-02-05 MED ORDER — FUROSEMIDE 40 MG PO TABS
40.0000 mg | ORAL_TABLET | Freq: Every day | ORAL | 1 refills | Status: DC
Start: 2021-02-05 — End: 2021-07-24

## 2021-02-06 ENCOUNTER — Telehealth: Payer: Self-pay | Admitting: *Deleted

## 2021-02-06 DIAGNOSIS — I4821 Permanent atrial fibrillation: Secondary | ICD-10-CM

## 2021-02-06 NOTE — Telephone Encounter (Signed)
Pt aware of results and recommendations and voiced and understanding.

## 2021-02-06 NOTE — Telephone Encounter (Signed)
Fax received 02/06/21 [facility] PT/INR self testing service Test date/time 02/06/21 at 8:44 am  INR 3.9   Call pt with any orders at [phone]

## 2021-02-06 NOTE — Telephone Encounter (Signed)
Description   INR was 3.9 today (goal is 2.0 to 3.0)   Hold today's dose (02/06/21),  then decrease to  2 mg daily except Tuesday and Thursday to   1 mg.   Recheck in 1 week

## 2021-02-20 ENCOUNTER — Telehealth: Payer: Self-pay | Admitting: *Deleted

## 2021-02-20 DIAGNOSIS — I4821 Permanent atrial fibrillation: Secondary | ICD-10-CM

## 2021-02-20 NOTE — Telephone Encounter (Signed)
Fax received [facility] PT/INR self testing service Test date/time 02/20/21 952 am INR 3.6

## 2021-02-20 NOTE — Telephone Encounter (Signed)
Patient aware.

## 2021-02-20 NOTE — Telephone Encounter (Signed)
Description   INR was 3.6 today (goal is 2.0 to 3.0)   Hold today's dose (02/20/21),  then decrease to 1 mg every except Monday, Wednesday, and Friday take 2 mg.   Recheck in 1 week

## 2021-02-27 ENCOUNTER — Telehealth: Payer: Self-pay | Admitting: *Deleted

## 2021-02-27 DIAGNOSIS — I4821 Permanent atrial fibrillation: Secondary | ICD-10-CM

## 2021-02-27 NOTE — Telephone Encounter (Signed)
Fax received [facility] PT/INR self testing service Test date/time 02/27/21 149 pm INR 1.7  Pt also called with her INR results, she is very nervous about her INR being 1.7, said she had a heart attack when it was 1.8 before

## 2021-02-27 NOTE — Telephone Encounter (Signed)
Spoke to patient.  Aware of instructions.  Indication: Atrial fibrillation Goal INR: 2-3 Current regimen: Had a salad last night.  Last week was instructed to hold Tues, 1mg  MWF and 2mg  all other days  Recommendations:  Increase today's dose to 2mg . Continue 2mg  on M,T,W,F. 1mg  all the rest of the days.  Repeat 1 week.

## 2021-03-01 ENCOUNTER — Telehealth: Payer: Self-pay | Admitting: Family

## 2021-03-01 NOTE — Telephone Encounter (Signed)
Patient states she has an appointment scheduled

## 2021-03-01 NOTE — Telephone Encounter (Signed)
Per front staff- [facility] nurse told her to put a message and send to provider

## 2021-03-01 NOTE — Telephone Encounter (Signed)
Patient is unable to video visit as well. She has no way of doing so per patient

## 2021-03-01 NOTE — Telephone Encounter (Signed)
Pt needs to be seen. If she can not come in she can do a video visit.   [name], FNP

## 2021-03-01 NOTE — Telephone Encounter (Signed)
I am sorry, but it will have to be a video visit or in person. She could get a family member with a smart phone to do a video visit.

## 2021-03-02 ENCOUNTER — Telehealth (INDEPENDENT_AMBULATORY_CARE_PROVIDER_SITE_OTHER): Payer: Medicare Other | Admitting: Family

## 2021-03-02 ENCOUNTER — Encounter: Payer: Self-pay | Admitting: Family

## 2021-03-02 DIAGNOSIS — M1990 Unspecified osteoarthritis, unspecified site: Secondary | ICD-10-CM | POA: Diagnosis not present

## 2021-03-02 DIAGNOSIS — I159 Secondary hypertension, unspecified: Secondary | ICD-10-CM

## 2021-03-02 DIAGNOSIS — Z515 Encounter for palliative care: Secondary | ICD-10-CM | POA: Diagnosis not present

## 2021-03-02 DIAGNOSIS — M0579 Rheumatoid arthritis with rheumatoid factor of multiple sites without organ or systems involvement: Secondary | ICD-10-CM

## 2021-03-02 MED ORDER — OXYCODONE-ACETAMINOPHEN 7.5-325 MG PO TABS: 1.0000 | ORAL_TABLET | ORAL | 0 refills | Status: DC | PRN

## 2021-03-02 MED ORDER — METHOCARBAMOL 750 MG PO TABS: 750.0000 mg | ORAL_TABLET | Freq: Four times a day (QID) | ORAL | 2 refills | Status: DC

## 2021-03-02 NOTE — Progress Notes (Signed)
Virtual Visit Consent   MONACA WADAS, you are scheduled for a virtual visit with a [facility] provider today.     Just as with appointments in the office, your consent must be obtained to participate.  Your consent will be active for this visit and any virtual visit you may have with one of our providers in the next 365 days.     If you have a MyChart account, a copy of this consent can be sent to you electronically.  All virtual visits are billed to your insurance company just like a traditional visit in the office.    As this is a virtual visit, video technology does not allow for your provider to perform a traditional examination.  This may limit your provider's ability to fully assess your condition.  If your provider identifies any concerns that need to be evaluated in person or the need to arrange testing (such as labs, EKG, etc.), we will make arrangements to do so.     Although advances in technology are sophisticated, we cannot ensure that it will always work on either your end or our end.  If the connection with a video visit is poor, the visit may have to be switched to a telephone visit.  With either a video or telephone visit, we are not always able to ensure that we have a secure connection.     I need to obtain your verbal consent now.   Are you willing to proceed with your visit today?    Angela Wong has provided verbal consent on 03/02/2021 for a virtual visit (video or telephone).   Angela Dun, FNP   Date: 03/02/2021 12:57 PM   Virtual Visit via Video Note   I, Angela Wong, connected with  Angela Wong  ([id number], 1940-05-19) on 03/02/21 at  9:40 AM EST by a video-enabled telemedicine application and verified that I am speaking with the correct person using two identifiers.  Location: Patient: Virtual Visit Location Patient: Home Provider: Virtual Visit Location Provider: Office/Clinic   I discussed the limitations of evaluation and management by  telemedicine and the availability of in person appointments. The patient expressed understanding and agreed to proceed.    History of Present Illness: Angela Wong is a 80 y.o. who identifies as a female who was assigned female at birth, and is being seen today for pain medication refill. She is currently been seen by palliative care. She has RA and arthritis.   HPI: Arthritis Presents for follow-up visit. She complains of pain, stiffness and joint warmth. Affected locations include the neck, right shoulder, left shoulder, left knee, right knee, left hip, right hip, right foot and left foot. Her pain is at a severity of 4/10.  Hypertension This is a chronic problem. The current episode started more than 1 year ago. The problem has been resolved since onset. The problem is controlled. Associated symptoms include malaise/fatigue. Pertinent negatives include no peripheral edema or shortness of breath. Risk factors for coronary artery disease include dyslipidemia, stress and sedentary lifestyle. The current treatment provides moderate improvement.  Back Pain This is a chronic problem. The current episode started more than 1 year ago. The problem has been waxing and waning since onset. The pain is present in the lumbar spine. The quality of the pain is described as aching. The pain is at a severity of 8/10. The pain is moderate. The symptoms are aggravated by bending and standing.   Problems:  Patient Active Problem  List   Diagnosis Date Noted   Pain    Calculus of gallbladder with acute cholecystitis and obstruction    Choledocholithiasis 10/17/2020   Elevated LFTs    Dysphagia    Multiple allergies 09/29/2020   Pacemaker 07/22/2019   Drug intolerance 07/22/2019   Edema of both lower extremities 07/22/2019   Subtherapeutic international normalized ratio (INR) 07/05/2019   CAD (coronary artery disease) 07/05/2019   Non-STEMI (non-ST elevated myocardial infarction) ([place]) 07/01/2019   Chronic  constipation 06/29/2019   Hypertensive heart disease with heart failure ([place]) 06/29/2019   Decompensated heart failure ([place]) 06/22/2019   Pressure injury of skin 06/22/2019   RA (rheumatoid arthritis) ([place]) 05/04/2019   Anticoagulation goal of INR 2 to 3 12/18/2017   Depression, major, single episode, moderate ([place]) 09/12/2017   Chest pain 08/25/2017   Generalized weakness 08/25/2017   Seasonal and perennial allergic rhinitis 12/31/2016   Allergic rhinitis with a nonallergic component 10/01/2016   Itching 10/01/2016   Skeeter syndrome 10/01/2016   Steroid-induced osteopenia 07/11/2016   Hyperparathyroidism ([place]) 01/16/2016   Iron deficiency anemia 12/18/2015   Insomnia 12/18/2015   Hypothyroidism 12/18/2015   Rotator cuff tear 03/23/2014   Osteoarthritis    Moderate persistent asthma without complication    Ovarian cancer ([place])    Fibromyalgia    RLS (restless legs syndrome)    Cerebral vasculitis    Sjogren's syndrome ([place])    Raynaud's disease    MVP (mitral valve prolapse)    IBS (irritable bowel syndrome)    Congestive dilated cardiomyopathy ([place]) 02/09/2014   Dyspnea 12/13/2013   Dyslipidemia 11/26/2013   Malaise and fatigue 11/26/2013   Allergy to multiple antibiotics 11/02/2013   History of ovarian cancer 10/01/2013   GERD (gastroesophageal reflux disease) 10/01/2013   Hypertension 10/01/2013   Atrial fibrillation, permanent ([place]) 07/27/2013    Allergies:  Allergies  Allergen Reactions   Cephalosporins Hives and Shortness Of Breath   Ciprofloxacin Hives and Shortness Of Breath   Diltiazem Shortness Of Breath    Swollen throat    Doxycycline Hives and Shortness Of Breath   Horse-Derived Products Anaphylaxis   Ketek [Telithromycin] Palpitations    Chest discomfort   Nitrofuran Derivatives Anaphylaxis and Hives    blisters   Nitrous Oxide Nausea And Vomiting    Severe due to Sjogrens (Auto-Immune Disease)   Other Anaphylaxis    ALLERGY TO HORSE SERUM    Penicillins Hives and Shortness Of Breath        Pentazocine Other (See Comments)    Other reaction(s): Mental Status Changes (intolerance) Altered Mental Status  Altered Mental Status  Altered Mental Status    Sulfa Antibiotics Hives and Shortness Of Breath   Trovan [Alatrofloxacin] Palpitations, Other (See Comments) and Anaphylaxis    Chest pain, dizziness, irregular pulse   Vitamin B12     Confusion and shaking    Zinc Gelatin [Zinc] Anaphylaxis   Amlodipine Swelling   Calcium Channel Blockers     Respiratory distress   Carvedilol Other (See Comments)    Dizziness, "joint pain, depression"   Clindamycin/Lincomycin     CP, lock jaw   Codeine Nausea Only   Cymbalta [Duloxetine Hcl] Swelling   Diovan [Valsartan] Swelling   Lisinopril Swelling   Omeprazole     Generic with additives    Sertraline Other (See Comments)    confusion   Tramadol Nausea Only   Loteprednol Etabonate Rash   Medications:  Current Outpatient Medications:    albuterol (VENTOLIN HFA)  108 (90 Base) MCG/ACT inhaler, INHALE 2 PUFFS BY MOUTH EVERY 6 HOURS AS NEEDED FOR WHEEZING (Patient taking differently: Inhale 1-2 puffs into the lungs every 6 (six) hours as needed for wheezing or shortness of breath.), Disp: 9 g, Rfl: 0   atenolol (TENORMIN) 25 MG tablet, Take 0.5 tablets (12.5 mg total) by mouth 2 (two) times daily., Disp: 60 tablet, Rfl: 5   cetirizine (ZYRTEC) 10 MG tablet, Take 1 tablet (10 mg total) by mouth daily., Disp: 30 tablet, Rfl: 11   furosemide (LASIX) 40 MG tablet, Take 1 tablet (40 mg total) by mouth daily., Disp: 90 tablet, Rfl: 1   hydrocortisone-pramoxine (ANALPRAM-HC) 2.5-1 % rectal cream, Place 1 application rectally See admin instructions. After each bathroom use, Disp: 30 g, Rfl: 6   levothyroxine (EUTHYROX) 50 MCG tablet, Take 2 tablets (100 mcg total) by mouth every morning., Disp: 180 tablet, Rfl: 3   methocarbamol (ROBAXIN) 750 MG tablet, Take 1 tablet (750 mg total) by mouth 4  (four) times daily., Disp: 180 tablet, Rfl: 2   oxyCODONE-acetaminophen (PERCOCET) 7.5-325 MG tablet, Take 1 tablet by mouth every 4 (four) hours as needed for severe pain., Disp: 180 tablet, Rfl: 0   predniSONE (DELTASONE) 5 MG tablet, Take 1 tablet (5 mg total) by mouth every morning., Disp: 90 tablet, Rfl: 1   RESTASIS 0.05 % ophthalmic emulsion, INSTILL 1 DROP INTO EACH EYE TWICE DAILY (Patient taking differently: Place 1 drop into both eyes 2 (two) times daily.), Disp: 120 each, Rfl: 0   warfarin (COUMADIN) 1 MG tablet, TAKE 4MG  MONDAY, WEDNESDAY, AND FRIDAY. TAKE 2 MG TUESDAY, THURSDAY, SATURDAY, AND SUNDAY, Disp: 90 tablet, Rfl: 3  Observations/Objective: Patient is well-developed, well-nourished in no acute distress.  Resting comfortably  at home.  Head is normocephalic, atraumatic.  No labored breathing.  Speech is clear and coherent with logical content.  Patient is alert and oriented at baseline.    Assessment and Plan: 1. Rheumatoid arthritis involving multiple sites with positive rheumatoid factor (HCC) - oxyCODONE-acetaminophen (PERCOCET) 7.5-325 MG tablet; Take 1 tablet by mouth every 4 (four) hours as needed for severe pain.  Dispense: 180 tablet; Refill: 0 - methocarbamol (ROBAXIN) 750 MG tablet; Take 1 tablet (750 mg total) by mouth 4 (four) times daily.  Dispense: 180 tablet; Refill: 2  2. Osteoarthritis, unspecified osteoarthritis type, unspecified site - oxyCODONE-acetaminophen (PERCOCET) 7.5-325 MG tablet; Take 1 tablet by mouth every 4 (four) hours as needed for severe pain.  Dispense: 180 tablet; Refill: 0 - methocarbamol (ROBAXIN) 750 MG tablet; Take 1 tablet (750 mg total) by mouth 4 (four) times daily.  Dispense: 180 tablet; Refill: 2  3. Palliative care by specialist - oxyCODONE-acetaminophen (PERCOCET) 7.5-325 MG tablet; Take 1 tablet by mouth every 4 (four) hours as needed for severe pain.  Dispense: 180 tablet; Refill: 0 - methocarbamol (ROBAXIN) 750 MG  tablet; Take 1 tablet (750 mg total) by mouth 4 (four) times daily.  Dispense: 180 tablet; Refill: 2  4. Secondary hypertension  Keep follow up with specialists Palliative care will refill medication next month Continue home exercises   Follow Up Instructions: I discussed the assessment and treatment plan with the patient. The patient was provided an opportunity to ask questions and all were answered. The patient agreed with the plan and demonstrated an understanding of the instructions.  A copy of instructions were sent to the patient via MyChart unless otherwise noted below.    The patient was advised to call back or seek  an in-person evaluation if the symptoms worsen or if the condition fails to improve as anticipated.  Time:  I spent 40 minutes with the patient via telehealth technology discussing the above problems/concerns.    Angela Dun, FNP

## 2021-03-02 NOTE — Patient Instructions (Signed)

## 2021-03-06 ENCOUNTER — Telehealth: Payer: Self-pay | Admitting: Family

## 2021-03-06 DIAGNOSIS — I4821 Permanent atrial fibrillation: Secondary | ICD-10-CM

## 2021-03-06 NOTE — Telephone Encounter (Signed)
Description   INR was 1.4 today (goal is 2.0 to 3.0)   Increase to 2 mg daily.   Recheck in 1 week

## 2021-03-06 NOTE — Telephone Encounter (Signed)
INR 1.4 this morning, please advise.

## 2021-03-06 NOTE — Telephone Encounter (Signed)
Calling to report out of range INR - 1.4

## 2021-03-06 NOTE — Telephone Encounter (Signed)
Pt aware.

## 2021-03-06 NOTE — Telephone Encounter (Signed)
Call pt with any changes - ph # [phone]

## 2021-03-09 ENCOUNTER — Telehealth: Payer: Self-pay | Admitting: Family

## 2021-03-09 MED ORDER — AZITHROMYCIN 250 MG PO TABS: ORAL_TABLET | ORAL | 0 refills | Status: DC

## 2021-03-09 NOTE — Telephone Encounter (Signed)
Pt aware by phone 

## 2021-03-09 NOTE — Telephone Encounter (Signed)
Zpak Prescription sent to pharmacy   

## 2021-03-13 ENCOUNTER — Telehealth: Payer: Self-pay | Admitting: Family

## 2021-03-13 ENCOUNTER — Telehealth: Payer: Self-pay | Admitting: *Deleted

## 2021-03-13 DIAGNOSIS — I4821 Permanent atrial fibrillation: Secondary | ICD-10-CM

## 2021-03-13 NOTE — Telephone Encounter (Signed)
Fax received mdINR PT/INR self testing service Test date/time 03/13/21 952 am INR 1.6

## 2021-03-13 NOTE — Telephone Encounter (Signed)
Description   INR was 1.6 today (goal is 2.0 to 3.0)   Take 4 mg today, then 3 mg on Mondays, then  2 mg daily.   Recheck in 1 week

## 2021-03-13 NOTE — Telephone Encounter (Signed)
Patient aware and verbalized understanding. °

## 2021-03-20 ENCOUNTER — Telehealth: Payer: Self-pay | Admitting: Family Medicine

## 2021-03-20 ENCOUNTER — Telehealth: Payer: Medicare Other

## 2021-03-20 DIAGNOSIS — I4821 Permanent atrial fibrillation: Secondary | ICD-10-CM

## 2021-03-20 NOTE — Telephone Encounter (Signed)
Description   ?INR was 2.2 today (goal is 2.0 to 3.0)  ? ?Continue  3 mg on Mondays, then  2 mg daily.  ? ?Recheck in 1 week ?  ? ?Her INR is at goal. She needs to continue her warfarin. If she wishes I can place referral for warfarin clinic.  ?

## 2021-03-20 NOTE — Telephone Encounter (Signed)
Patient aware and states that she does not want to go to clinic.  ?

## 2021-03-27 ENCOUNTER — Telehealth: Payer: Self-pay | Admitting: Family

## 2021-03-27 NOTE — Telephone Encounter (Signed)
Patient calling to let us know that her INR is 2.0 ?

## 2021-03-28 NOTE — Telephone Encounter (Signed)
Pt called stating that she is still waiting to hear from her PCP or any provider advising her on what to do about her INR. Says she checked her INR yesterday and it was 2.0.. Says she went ahead and took '2mg'$  of her medicine since she didn't hear from anyone but needs to know what to take today and so forth.  ?

## 2021-03-29 NOTE — Telephone Encounter (Signed)
Patient aware and verbalized understanding. °

## 2021-03-29 NOTE — Telephone Encounter (Signed)
To my understanding, palliative was going to continue this medication not me.  ?

## 2021-03-29 NOTE — Telephone Encounter (Signed)
Called and spoke with patient. Kim From Palliative care has resigned her position there, and the new people coming said they would not do it that you would have to. Please advise  ?

## 2021-03-29 NOTE — Telephone Encounter (Signed)
Patient states she needs oer oxycodone said that you took it over from palliative. And you would send in with out appt because you know do you want me to schedule a phone visit ?  ?

## 2021-03-30 ENCOUNTER — Telehealth (INDEPENDENT_AMBULATORY_CARE_PROVIDER_SITE_OTHER): Payer: Medicare Other | Admitting: Family

## 2021-03-30 ENCOUNTER — Encounter: Payer: Self-pay | Admitting: Family

## 2021-03-30 ENCOUNTER — Telehealth: Payer: Self-pay | Admitting: Family

## 2021-03-30 DIAGNOSIS — M1990 Unspecified osteoarthritis, unspecified site: Secondary | ICD-10-CM

## 2021-03-30 DIAGNOSIS — R531 Weakness: Secondary | ICD-10-CM

## 2021-03-30 DIAGNOSIS — M0579 Rheumatoid arthritis with rheumatoid factor of multiple sites without organ or systems involvement: Secondary | ICD-10-CM

## 2021-03-30 DIAGNOSIS — J4540 Moderate persistent asthma, uncomplicated: Secondary | ICD-10-CM

## 2021-03-30 DIAGNOSIS — I159 Secondary hypertension, unspecified: Secondary | ICD-10-CM | POA: Diagnosis not present

## 2021-03-30 DIAGNOSIS — Z515 Encounter for palliative care: Secondary | ICD-10-CM | POA: Diagnosis not present

## 2021-03-30 DIAGNOSIS — I110 Hypertensive heart disease with heart failure: Secondary | ICD-10-CM

## 2021-03-30 MED ORDER — METHOCARBAMOL 750 MG PO TABS: 750.0000 mg | ORAL_TABLET | Freq: Four times a day (QID) | ORAL | 2 refills | Status: DC

## 2021-03-30 MED ORDER — OXYCODONE-ACETAMINOPHEN 7.5-325 MG PO TABS: 1.0000 | ORAL_TABLET | ORAL | 0 refills | Status: DC | PRN

## 2021-03-30 NOTE — Telephone Encounter (Signed)
Ok, please schedule patient a video visit to refill this.  ?

## 2021-03-30 NOTE — Telephone Encounter (Signed)
Appt made

## 2021-03-30 NOTE — Addendum Note (Signed)
Addended by: [name] A on: 03/30/2021 10:40 AM ? ? Modules accepted: Orders ? ?

## 2021-03-30 NOTE — Progress Notes (Signed)
?Virtual Visit Consent  ? ?[name], you are scheduled for a virtual visit with a [facility] provider today.   ?  ?Just as with appointments in the office, your consent must be obtained to participate.  Your consent will be active for this visit and any virtual visit you may have with one of our providers in the next 365 days.   ?  ?If you have a MyChart account, a copy of this consent can be sent to you electronically.  All virtual visits are billed to your insurance company just like a traditional visit in the office.   ? ?As this is a virtual visit, video technology does not allow for your provider to perform a traditional examination.  This may limit your provider's ability to fully assess your condition.  If your provider identifies any concerns that need to be evaluated in person or the need to arrange testing (such as labs, EKG, etc.), we will make arrangements to do so.   ?  ?Although advances in technology are sophisticated, we cannot ensure that it will always work on either your end or our end.  If the connection with a video visit is poor, the visit may have to be switched to a telephone visit.  With either a video or telephone visit, we are not always able to ensure that we have a secure connection.    ? ?I need to obtain your verbal consent now.   Are you willing to proceed with your visit today?  ?  ?Angela Wong has provided verbal consent on 03/30/2021 for a virtual visit (video or telephone). ?  ?[name], FNP  ? ?Date: 03/30/2021 2:25 PM ? ? ?Virtual Visit via Video Note  ? ?IEvelina Dun, connected with  Angela Wong  ([id number], [birth year]) on 03/30/21 at  2:10 PM EDT by a video-enabled telemedicine application and verified that I am speaking with the correct person using two identifiers. ? ?Location: ?Patient: Virtual Visit Location Patient: Home ?Provider: Virtual Visit Location Provider: Home Office ?  ?I discussed the limitations of evaluation and management by  telemedicine and the availability of in person appointments. The patient expressed understanding and agreed to proceed.   ? ?History of Present Illness: ?Angela Wong is a 81 y.o. who identifies as a female who was assigned female at birth, and is being seen today for pain medication refill. She is currently been seen by palliative care. She has RA and arthritis. She has generalized weakness and has caregiver 24 hours.  ? ?HPI: Arthritis ?Presents for follow-up visit. She complains of pain and stiffness. The symptoms have been worsening. Affected locations include the right knee, left knee, right MCP and left MCP (back). Pain scale currently: 5 while sitting and 8-10 when standing.  ?Hypertension ?This is a chronic problem. The current episode started more than 1 year ago. The problem has been resolved since onset. The problem is controlled. Associated symptoms include malaise/fatigue, peripheral edema and shortness of breath. Risk factors for coronary artery disease include dyslipidemia and sedentary lifestyle. The current treatment provides moderate improvement. Hypertensive end-organ damage includes heart failure.  ?Asthma ?She complains of shortness of breath. There is no cough or wheezing. The current episode started more than 1 year ago. The problem occurs intermittently. Associated symptoms include malaise/fatigue. Her symptoms are alleviated by rest. Her symptoms are not alleviated by rest. Her past medical history is significant for asthma.  ?Constipation ?This is a chronic problem. The current episode  started more than 1 year ago. The problem has been waxing and waning since onset. Her stool frequency is 2 to 3 times per week. She has tried diet changes, fiber and stool softeners for the symptoms. The treatment provided mild relief.   ?Problems:  ?Patient Active Problem List  ? Diagnosis Date Noted  ? Pain   ? Calculus of gallbladder with acute cholecystitis and obstruction   ? Choledocholithiasis  10/17/2020  ? Elevated LFTs   ? Dysphagia   ? Multiple allergies 09/29/2020  ? Pacemaker 07/22/2019  ? Drug intolerance 07/22/2019  ? Edema of both lower extremities 07/22/2019  ? Subtherapeutic international normalized ratio (INR) 07/05/2019  ? CAD (coronary artery disease) 07/05/2019  ? Non-STEMI (non-ST elevated myocardial infarction) ([place]) 07/01/2019  ? Chronic constipation 06/29/2019  ? Hypertensive heart disease with heart failure ([place]) 06/29/2019  ? Decompensated heart failure ([place]) 06/22/2019  ? Pressure injury of skin 06/22/2019  ? RA (rheumatoid arthritis) ([place]) 05/04/2019  ? Anticoagulation goal of INR 2 to 3 12/18/2017  ? Depression, major, single episode, moderate ([place]) 09/12/2017  ? Chest pain 08/25/2017  ? Generalized weakness 08/25/2017  ? Seasonal and perennial allergic rhinitis 12/31/2016  ? Allergic rhinitis with a nonallergic component 10/01/2016  ? Itching 10/01/2016  ? Skeeter syndrome 10/01/2016  ? Steroid-induced osteopenia 07/11/2016  ? Hyperparathyroidism ([place]) 01/16/2016  ? Iron deficiency anemia 12/18/2015  ? Insomnia 12/18/2015  ? Hypothyroidism 12/18/2015  ? Rotator cuff tear 03/23/2014  ? Osteoarthritis   ? Moderate persistent asthma without complication   ? Ovarian cancer ([place])   ? Fibromyalgia   ? RLS (restless legs syndrome)   ? Cerebral vasculitis   ? Sjogren's syndrome ([place])   ? Raynaud's disease   ? MVP (mitral valve prolapse)   ? IBS (irritable bowel syndrome)   ? Congestive dilated cardiomyopathy ([place]) 02/09/2014  ? Dyspnea 12/13/2013  ? Dyslipidemia 11/26/2013  ? Malaise and fatigue 11/26/2013  ? Allergy to multiple antibiotics 11/02/2013  ? History of ovarian cancer 10/01/2013  ? GERD (gastroesophageal reflux disease) 10/01/2013  ? Hypertension 10/01/2013  ? Atrial fibrillation, permanent ([place]) 07/27/2013  ?  ?Allergies:  ?Allergies  ?Allergen Reactions  ? Cephalosporins Hives and Shortness Of Breath  ? Ciprofloxacin Hives and Shortness Of Breath  ? Diltiazem Shortness Of  Breath  ?  Swollen throat ?  ? Doxycycline Hives and Shortness Of Breath  ? Horse-Derived Products Anaphylaxis  ? Ketek [Telithromycin] Palpitations  ?  Chest discomfort  ? Nitrofuran Derivatives Anaphylaxis and Hives  ?  blisters  ? Nitrous Oxide Nausea And Vomiting  ?  Severe due to Sjogrens (Auto-Immune Disease)  ? Other Anaphylaxis  ?  ALLERGY TO HORSE SERUM  ? Penicillins Hives and Shortness Of Breath  ?   ?  ? Pentazocine Other (See Comments)  ?  Other reaction(s): Mental Status Changes (intolerance) ?Altered Mental Status  ?Altered Mental Status  ?Altered Mental Status   ? Sulfa Antibiotics Hives and Shortness Of Breath  ? Trovan [Alatrofloxacin] Palpitations, Other (See Comments) and Anaphylaxis  ?  Chest pain, dizziness, irregular pulse  ? Vitamin B12   ?  Confusion and shaking ?  ? Zinc Gelatin [Zinc] Anaphylaxis  ? Amlodipine Swelling  ? Calcium Channel Blockers   ?  Respiratory distress  ? Carvedilol Other (See Comments)  ?  Dizziness, "joint pain, depression"  ? Clindamycin/Lincomycin   ?  CP, lock jaw  ? Codeine Nausea Only  ? Cymbalta [Duloxetine Hcl] Swelling  ? Diovan [Valsartan]  Swelling  ? Lisinopril Swelling  ? Omeprazole   ?  Generic with additives   ? Sertraline Other (See Comments)  ?  confusion  ? Tramadol Nausea Only  ? Loteprednol Etabonate Rash  ? ?Medications:  ?Current Outpatient Medications:  ?  [START ON 04/27/2021] oxyCODONE-acetaminophen (PERCOCET) 7.5-325 MG tablet, Take 1 tablet by mouth every 4 (four) hours as needed for severe pain., Disp: 180 tablet, Rfl: 0 ?  [START ON 05/28/2021] oxyCODONE-acetaminophen (PERCOCET) 7.5-325 MG tablet, Take 1 tablet by mouth every 4 (four) hours as needed for severe pain., Disp: 180 tablet, Rfl: 0 ?  albuterol (VENTOLIN HFA) 108 (90 Base) MCG/ACT inhaler, INHALE 2 PUFFS BY MOUTH EVERY 6 HOURS AS NEEDED FOR WHEEZING (Patient taking differently: Inhale 1-2 puffs into the lungs every 6 (six) hours as needed for wheezing or shortness of breath.), Disp:  9 g, Rfl: 0 ?  atenolol (TENORMIN) 25 MG tablet, Take 0.5 tablets (12.5 mg total) by mouth 2 (two) times daily., Disp: 60 tablet, Rfl: 5 ?  cetirizine (ZYRTEC) 10 MG tablet, Take 1 tablet (10 mg total) by mouth daily

## 2021-04-03 ENCOUNTER — Other Ambulatory Visit: Payer: Self-pay | Admitting: Family

## 2021-04-03 ENCOUNTER — Telehealth: Payer: Self-pay | Admitting: Family

## 2021-04-03 DIAGNOSIS — I4821 Permanent atrial fibrillation: Secondary | ICD-10-CM

## 2021-04-03 NOTE — Telephone Encounter (Signed)
Patient calling in to report her INR as 1.8 ?She also had a small nose bleed yesterday.  ?Please call back.  ?

## 2021-04-03 NOTE — Telephone Encounter (Signed)
Description   ?INR was 1.8 today (goal is 2.0 to 3.0)  ? ?Take 3 mg on Mondays and Friday, then  2 mg daily.  ? ?Recheck in 1 week ?  ? ? ?

## 2021-04-03 NOTE — Telephone Encounter (Signed)
Patient aware and verbalized understanding. °

## 2021-04-04 ENCOUNTER — Telehealth: Payer: Self-pay | Admitting: Family

## 2021-04-04 DIAGNOSIS — I4821 Permanent atrial fibrillation: Secondary | ICD-10-CM

## 2021-04-04 NOTE — Telephone Encounter (Signed)
Pt aware request for refill came in today but it had to go to [facility] for review and approval since it was last refilled by a provider when she was in the hospital. [name] will be in tomorrow, I reassured pt this will be done tomorrow for her dose tomorrow night. ?

## 2021-04-04 NOTE — Telephone Encounter (Signed)
?  Prescription Request ? ?04/04/2021 ? ?Is this a "Controlled Substance" medicine? NO ? ?Have you seen your PCP in the last 2 weeks? YES  ? ?If YES, route message to pool  -  If NO, patient needs to be scheduled for appointment. ? ?What is the name of the medication or equipment? Warfarin 1 mg ? ?Have you contacted your pharmacy to request a refill? Yes  ? ?Which pharmacy would you like this sent to? [facility] in [place] ? ? ?Patient notified that their request is being sent to the clinical staff for review and that they should receive a response within 2 business days.  ?  ?

## 2021-04-05 NOTE — Telephone Encounter (Signed)
Pt aware refill sent to pharmacy since she does not have med for tonight ?

## 2021-04-10 ENCOUNTER — Telehealth: Payer: Self-pay | Admitting: Family

## 2021-04-10 DIAGNOSIS — I4821 Permanent atrial fibrillation: Secondary | ICD-10-CM

## 2021-04-10 NOTE — Telephone Encounter (Signed)
Patient aware and verbalized understanding. °

## 2021-04-10 NOTE — Telephone Encounter (Signed)
Description   ?INR was 2.5 today (goal is 2.0 to 3.0)  ? ?Continue with current dose of  3 mg on Mondays and Friday, then 2 mg daily.  ? ?Recheck in 1 week ?  ? ? ?

## 2021-04-13 ENCOUNTER — Telehealth: Payer: Self-pay | Admitting: Family

## 2021-04-17 ENCOUNTER — Telehealth: Payer: Self-pay | Admitting: Family

## 2021-04-17 DIAGNOSIS — I4821 Permanent atrial fibrillation: Secondary | ICD-10-CM

## 2021-04-17 NOTE — Telephone Encounter (Signed)
Patient aware and verbalized understanding. °

## 2021-04-17 NOTE — Telephone Encounter (Signed)
Description   ?INR was 2.5 today (goal is 2.0 to 3.0)  ? ?Continue with current dose of  3 mg on Mondays and Friday, then 2 mg daily.  ? ?Recheck in 1 week ?  ? ?She can slowly introduce greens in her diet.  ?

## 2021-04-23 ENCOUNTER — Other Ambulatory Visit: Payer: Self-pay | Admitting: Family

## 2021-04-24 ENCOUNTER — Telehealth: Payer: Self-pay | Admitting: *Deleted

## 2021-04-24 NOTE — Telephone Encounter (Signed)
Phone call  received from pt  ?mdINR PT/INR self testing service ?Test date/time 04/24/21 @ 400pm   ?INR 2.1 ? ?Goal is 2-3  ? ?Please address dose going forward for [facility] and have nurse call the pt back tonight.  ? ? ? ? ?

## 2021-04-24 NOTE — Telephone Encounter (Signed)
Patient aware and verbalized understanding. °

## 2021-04-24 NOTE — Telephone Encounter (Signed)
INR at goal. Continue current dosage. Recheck in 1 week.  ?

## 2021-04-25 ENCOUNTER — Telehealth: Payer: Self-pay | Admitting: Family

## 2021-04-25 NOTE — Telephone Encounter (Signed)
Key: [id number] - PA Case ID: [id number] - Rx #: [id number] Need help? Call us at [phone] ?Status ?Sent to [facility] ?Drug ?Methocarbamol '750MG'$  tablets ?

## 2021-04-26 NOTE — Telephone Encounter (Signed)
Denied on April 12 ?Request Reference Number: [id number]. METHOCARBAM TAB '750MG'$  is denied for not meeting the prior authorization requirement(s). Details of this decision are in the notice attached below or have been faxed to you. ?

## 2021-04-27 ENCOUNTER — Telehealth: Payer: Self-pay | Admitting: Family

## 2021-04-27 DIAGNOSIS — Z515 Encounter for palliative care: Secondary | ICD-10-CM

## 2021-04-27 DIAGNOSIS — M0579 Rheumatoid arthritis with rheumatoid factor of multiple sites without organ or systems involvement: Secondary | ICD-10-CM

## 2021-04-27 DIAGNOSIS — M1990 Unspecified osteoarthritis, unspecified site: Secondary | ICD-10-CM

## 2021-04-27 NOTE — Telephone Encounter (Signed)
Patient aware to call around to pharmacies and let us know where to send it.    ?

## 2021-04-27 NOTE — Telephone Encounter (Signed)
Patient calling because [facility] is out of oxyCODONE-acetaminophen (PERCOCET) 7.5-325 MG tablet. She wants to know if it can be sent to a different pharmacy.  ?

## 2021-04-30 NOTE — Telephone Encounter (Signed)
Needs Oxycodone Rx sent to [facility] in [place].  ?

## 2021-04-30 NOTE — Telephone Encounter (Signed)
Pt checking on status of this, because pt son has to pick it up ?

## 2021-05-01 ENCOUNTER — Telehealth: Payer: Self-pay | Admitting: Family

## 2021-05-01 ENCOUNTER — Other Ambulatory Visit: Payer: Self-pay | Admitting: Family Medicine

## 2021-05-01 ENCOUNTER — Other Ambulatory Visit: Payer: Self-pay | Admitting: Family

## 2021-05-01 ENCOUNTER — Telehealth: Payer: Self-pay | Admitting: Family Medicine

## 2021-05-01 DIAGNOSIS — I4821 Permanent atrial fibrillation: Secondary | ICD-10-CM

## 2021-05-01 MED ORDER — OXYCODONE-ACETAMINOPHEN 7.5-325 MG PO TABS: 1.0000 | ORAL_TABLET | ORAL | 0 refills | Status: DC | PRN

## 2021-05-01 MED ORDER — HYDROCORT-PRAMOXINE (PERIANAL) 2.5-1 % EX CREA: 1.0000 "application " | TOPICAL_CREAM | CUTANEOUS | 6 refills | Status: DC

## 2021-05-01 NOTE — Telephone Encounter (Signed)
[facility] they have all in stock. Please send there  ? ?

## 2021-05-01 NOTE — Telephone Encounter (Signed)
Prescription sent to pharmacy.

## 2021-05-01 NOTE — Telephone Encounter (Signed)
I called and spoke with patient and discussed her complaints. She was upset that no one returned her call from yesterday about needing her pain medication sent to a different pharmacy. I advised patient of office protocol and tried to explain to patient what happened and why it was not sent in yesterday. Patient was also upset that when she called today she was told that we did not have any messages from her about needing her pain medicine sent to a different pharmacy from yesterday. Patient was advised that her telephone calls were in her chart and that they were probably looking for yesterday's date but the message was attached to 04/14 call. Patient is homebound and I discussed the importance of her needing to be seen in person so we could make sure she was able to get her labs drawn and home health. Patient will call back and schedule appointment with [facility] as soon as she is able.  ?

## 2021-05-01 NOTE — Telephone Encounter (Signed)
Description   ?INR was 2.2 today (goal is 2.0 to 3.0)  ? ?Continue with current dose of  3 mg on Mondays and Friday, then 2 mg daily.  ? ?Recheck in 1 week ?  ? ? ?

## 2021-05-01 NOTE — Telephone Encounter (Signed)
[facility] has 20 pills they will fill until we can fins it somewhere else.She also wants to speak with management today. Patient is also asking for cream for every time she wipes refilled hydrocortisone cream. She is very upset crying states that the way she was talked to and lied to here was unacceptable wants to file a formal complaint.  ?

## 2021-05-01 NOTE — Telephone Encounter (Signed)
Patient aware and verbalized understanding. °

## 2021-05-02 NOTE — Telephone Encounter (Signed)
Closing this encounter, taken care of in another encounter w/ pt ?

## 2021-05-08 ENCOUNTER — Telehealth: Payer: Self-pay | Admitting: Family

## 2021-05-08 DIAGNOSIS — I4821 Permanent atrial fibrillation: Secondary | ICD-10-CM

## 2021-05-10 NOTE — Telephone Encounter (Signed)
Description   ?INR was 2.3 today (goal is 2.0 to 3.0)  ? ?Continue with current dose of  3 mg on Mondays and Friday, then 2 mg daily.  ? ?Recheck in 1 week ?  ? ?Please call with abnormal or normal results.  ? ?[name], FNP ? ?

## 2021-05-10 NOTE — Telephone Encounter (Signed)
Pt has been informed of results and Christy's recommendations. Provided pt with our fax to inform [employer] because they told pt that they did not have the correct fax for [place]. Pt aware that [name] should be contacted with normal and abnormal results. ?

## 2021-05-15 ENCOUNTER — Other Ambulatory Visit: Payer: Self-pay | Admitting: Emergency Medicine

## 2021-05-15 ENCOUNTER — Telehealth: Payer: Self-pay | Admitting: Family

## 2021-05-15 NOTE — Telephone Encounter (Signed)
What has she been using to check every week? ?

## 2021-05-15 NOTE — Telephone Encounter (Signed)
Pt called requesting to speak with PCP or nurse regarding issues with getting her INR checks. Says we ordered her something to check them at home but says she cant use it nor see it.  ?

## 2021-05-16 ENCOUNTER — Other Ambulatory Visit: Payer: Self-pay | Admitting: *Deleted

## 2021-05-16 MED ORDER — ATENOLOL 25 MG PO TABS: 12.5000 mg | ORAL_TABLET | Freq: Two times a day (BID) | ORAL | 5 refills | Status: DC

## 2021-05-16 NOTE — Telephone Encounter (Signed)
Tye [place] will call home health to discuss meter.  ? ?[name], FNP ? ?

## 2021-05-17 ENCOUNTER — Telehealth: Payer: Self-pay

## 2021-05-17 NOTE — Telephone Encounter (Signed)
Patient called and states that she is having a reaction to medications due to the additives that is added to generic - advised the patient that she needs to get with the pharmacy as to what is in each medication as they decide which company they order their medications from and if she needs to she can ask for name brand medication. Patient states that the medications that are causing her problems are the coumadin, lasix, and atenolol - she did add the pain medication the second time I spoke to her. Patient c/o being hot, cold, nausea, itchy, and "feeling bad" for the last 3-4 months. ? ?Per patient her dose for atenolol is incorrect cardiology had her on 1/2 tab daily and a second as needed for A-fib instead of the twice daily. I advised patient that she needs to follow up with cards on this medication as it was changes at the hospital and [facility] filled off that order so she would have enough until follow up with them.  ? ?Patient had a tick on her and has blackheads and several breakouts on her body. She is also having sinus congestion and pressure. Patient is wanting an antibiotic.I advised patient that she would need an OV and offered to make her one, she does not have transportation. Please advise if OV needs to be in person.  ? ?Patient also had questions and issues with her INR machine, Cathy spoke to [place] and they will send a tech out to address the issue.  ?

## 2021-05-17 NOTE — Telephone Encounter (Signed)
Sent community message to [employer] w/ [employer], then called him. He will check on this, if she got a new machine, if she did who was if it from, he is not aware that she got one from [facility]. Also faxed results are to come to the Home Health/Referral fax [phone], for the nurse to take care of these results. ?Ashly will let Christy & I both know what this. ?

## 2021-05-21 NOTE — Telephone Encounter (Signed)
Patient states she can come in on weds morning or Friday. There is no availability with you. Can she see another provider.  ? ?  ?

## 2021-05-21 NOTE — Telephone Encounter (Signed)
Patient states that she does not have a ride here. She states that she can only do a video visit. I discussed because of the type of medication she needs we need to do a in person. Please advise.  ? ? ?

## 2021-05-21 NOTE — Telephone Encounter (Signed)
Please schedule patient an in person visit to discuss issues and pain medications.  ?

## 2021-05-21 NOTE — Telephone Encounter (Signed)
Patient also states that she is having some sinus pain, face is hurting. She wants to know if we can send in a z pack for her? Should I make for a video visit for this sinus pain? ? ?Patient is checking with caregiver to see when she can come in..  ? ?Please advise.  ? ? ?

## 2021-05-21 NOTE — Telephone Encounter (Signed)
She will need to see me. Please make appointment 30 mins.  ?

## 2021-05-22 ENCOUNTER — Telehealth: Payer: Self-pay | Admitting: Family

## 2021-05-22 DIAGNOSIS — I4821 Permanent atrial fibrillation: Secondary | ICD-10-CM

## 2021-05-22 NOTE — Telephone Encounter (Signed)
Appt made for 8:10am by telephone. Pt aware. ? ?

## 2021-05-22 NOTE — Telephone Encounter (Signed)
Pt can be scheduled tomorrow for video or telephone visit for sinus problems only.  ? ?Angela Dun, FNP ? ?

## 2021-05-22 NOTE — Telephone Encounter (Signed)
Description   ?INR was 2.1 today (goal is 2.0 to 3.0)  ? ?Continue with current dose of  3 mg on Mondays and Friday, then 2 mg daily.  ? ?Recheck in 1 week ?  ? ? ?

## 2021-05-22 NOTE — Telephone Encounter (Signed)
Pt has been informed of results and medication instruction. She has no concerns. ?

## 2021-05-24 ENCOUNTER — Telehealth: Payer: Self-pay | Admitting: Family

## 2021-05-24 ENCOUNTER — Ambulatory Visit (INDEPENDENT_AMBULATORY_CARE_PROVIDER_SITE_OTHER): Payer: Medicare Other | Admitting: Family

## 2021-05-24 ENCOUNTER — Encounter: Payer: Self-pay | Admitting: Family

## 2021-05-24 DIAGNOSIS — E039 Hypothyroidism, unspecified: Secondary | ICD-10-CM

## 2021-05-24 DIAGNOSIS — Z7901 Long term (current) use of anticoagulants: Secondary | ICD-10-CM

## 2021-05-24 DIAGNOSIS — I110 Hypertensive heart disease with heart failure: Secondary | ICD-10-CM | POA: Diagnosis not present

## 2021-05-24 DIAGNOSIS — K5909 Other constipation: Secondary | ICD-10-CM

## 2021-05-24 DIAGNOSIS — J019 Acute sinusitis, unspecified: Secondary | ICD-10-CM

## 2021-05-24 DIAGNOSIS — Z5181 Encounter for therapeutic drug level monitoring: Secondary | ICD-10-CM

## 2021-05-24 DIAGNOSIS — Z889 Allergy status to unspecified drugs, medicaments and biological substances status: Secondary | ICD-10-CM

## 2021-05-24 DIAGNOSIS — I4821 Permanent atrial fibrillation: Secondary | ICD-10-CM

## 2021-05-24 MED ORDER — LEVOTHYROXINE SODIUM 50 MCG PO TABS: 100.0000 ug | ORAL_TABLET | Freq: Every morning | ORAL | 3 refills | Status: DC

## 2021-05-24 MED ORDER — AZITHROMYCIN 250 MG PO TABS: ORAL_TABLET | ORAL | 0 refills | Status: DC

## 2021-05-24 NOTE — Telephone Encounter (Signed)
Patient aware and will call pharmacy's to see where it can be sent to and call us back.  ?

## 2021-05-24 NOTE — Progress Notes (Signed)
? ?Virtual Visit  Note ?Due to COVID-19 pandemic this visit was conducted virtually. This visit type was conducted due to national recommendations for restrictions regarding the COVID-19 Pandemic (e.g. social distancing, sheltering in place) in an effort to limit this patient's exposure and mitigate transmission in our community. All issues noted in this document were discussed and addressed.  A physical exam was not performed with this format. ? ?I connected with Angela Wong on 05/24/21 at 8:10 AM by telephone and verified that I am speaking with the correct person using two identifiers. Angela Wong is currently located at home and no one is currently with her during visit. The provider, [name], FNP is located in their office at time of visit. ? ?I discussed the limitations, risks, security and privacy concerns of performing an evaluation and management service by telephone and the availability of in person appointments. I also discussed with the patient that there may be a patient responsible charge related to this service. The patient expressed understanding and agreed to proceed. ? ? ?Ms. Frier,you are scheduled for a virtual visit with your provider today.   ? ?Just as we do with appointments in the office, we must obtain your consent to participate.  Your consent will be active for this visit and any virtual visit you may have with one of our providers in the next 365 days.   ? ?If you have a MyChart account, I can also send a copy of this consent to you electronically.  All virtual visits are billed to your insurance company just like a traditional visit in the office.  As this is a virtual visit, video technology does not allow for your provider to perform a traditional examination.  This may limit your provider's ability to fully assess your condition.  If your provider identifies any concerns that need to be evaluated in person or the need to arrange testing such as labs, EKG, etc, we will  make arrangements to do so.   ? ?Although advances in technology are sophisticated, we cannot ensure that it will always work on either your end or our end.  If the connection with a video visit is poor, we may have to switch to a telephone visit.  With either a video or telephone visit, we are not always able to ensure that we have a secure connection.   I need to obtain your verbal consent now.   Are you willing to proceed with your visit today?  ? ?Angela Wong has provided verbal consent on 05/24/2021 for a virtual visit (video or telephone). ? ? ?[name], FNP ?05/24/2021  8:11 AM ? ? ? ?History and Present Illness: ? ?Pt calling today with several complaints. She feels like all of her medications have "additives and Vit B 12". Reports when she takes her warfarin, lasix, atenolol she is having abdominal pain. She feels this is because of the added Vit B 12.  ? ?She is complaining of constipation. She reports eating fruits and vegetables, but continues to have hard stools.   ? ?She has A Fib and takes warfarin.  ?Hypertension ?This is a chronic problem. The current episode started more than 1 year ago. The problem has been resolved since onset. The problem is controlled. Associated symptoms include headaches, malaise/fatigue and shortness of breath. Pertinent negatives include no palpitations or peripheral edema. Past treatments include beta blockers and diuretics. The current treatment provides moderate improvement.  ?Constipation ?This is a chronic problem. The current  episode started more than 1 year ago. The problem has been waxing and waning since onset. Her stool frequency is 4 to 5 times per week. She has tried diet changes, manual disimpaction and stool softeners for the symptoms. The treatment provided moderate relief.  ?Congestive Heart Failure ?Presents for follow-up visit. Associated symptoms include fatigue and shortness of breath. Pertinent negatives include no palpitations. The symptoms have  been stable.  ?Sinusitis ?This is a chronic problem. The current episode started more than 1 month ago. The problem has been gradually worsening since onset. There has been no fever. The pain is moderate. Associated symptoms include congestion, coughing, headaches, a hoarse voice, shortness of breath and sinus pressure. Pertinent negatives include no chills. Past treatments include oral decongestants and acetaminophen. The treatment provided mild relief.  ? ? ? ?Review of Systems  ?Constitutional:  Positive for fatigue and malaise/fatigue. Negative for chills.  ?HENT:  Positive for congestion, hoarse voice and sinus pressure.   ?Respiratory:  Positive for cough and shortness of breath.   ?Cardiovascular:  Negative for palpitations.  ?Gastrointestinal:  Positive for constipation.  ?Neurological:  Positive for headaches.  ? ? ?Observations/Objective: ?No SOB or distress noted, anxious  ? ?Assessment and Plan: ?1. Hypertensive heart disease with heart failure ([place]) ? ?2. Chronic constipation ? ? ?3. Hypothyroidism, unspecified type ?- levothyroxine (EUTHYROX) 50 MCG tablet; Take 2 tablets (100 mcg total) by mouth every morning.  Dispense: 180 tablet; Refill: 3 ? ?4. Anticoagulation goal of INR 2 to 3 ? ?5. Atrial fibrillation, permanent ([place]) ? ?6. Acute sinusitis, recurrence not specified, unspecified location ?- azithromycin (ZITHROMAX) 250 MG tablet; Take 500 mg once, then 250 mg for four days  Dispense: 6 tablet; Refill: 0 ? ?Long discussion patient that these medications have not changed their ingredients. I can not change her medications. If she wishes to stop the warfarin, this puts her at increased risk of CVA and I do not recommend.  ?I have reordered her thyroid and requested Euthyrox brand as she states she seemed to tolerate this better.  ?Her sinus pain is slightly better today.  Recommend using OTC medications for a few more days. If no improvement she can start Zpak. Discussed risks with this medication.  She has multiple drug allergies and can not tolerate other antibiotics.  ? ?  ?I discussed the assessment and treatment plan with the patient. The patient was provided an opportunity to ask questions and all were answered. The patient agreed with the plan and demonstrated an understanding of the instructions. ?  ?The patient was advised to call back or seek an in-person evaluation if the symptoms worsen or if the condition fails to improve as anticipated. ? ?The above assessment and management plan was discussed with the patient. The patient verbalized understanding of and has agreed to the management plan. Patient is aware to call the clinic if symptoms persist or worsen. Patient is aware when to return to the clinic for a follow-up visit. Patient educated on when it is appropriate to go to the emergency department.  ? ?Time call ended:  8:33 AM  ? ?I provided 23 minutes of  non face-to-face time during this encounter. ? ? ? ?[name], FNP ? ? ?

## 2021-05-24 NOTE — Telephone Encounter (Signed)
[facility] does not carry levothyroxine (EUTHYROX) 50 MCG tablet. Please call back ?

## 2021-05-24 NOTE — Telephone Encounter (Signed)
Pt aware [facility] to [facility] ?

## 2021-05-24 NOTE — Telephone Encounter (Signed)
[facility] and said [facility] Euphyrox. She said she spoke with Candice. ?

## 2021-05-30 ENCOUNTER — Telehealth: Payer: Self-pay | Admitting: Family

## 2021-05-30 DIAGNOSIS — I4821 Permanent atrial fibrillation: Secondary | ICD-10-CM

## 2021-05-30 NOTE — Telephone Encounter (Signed)
Description   ?INR was 2.1 today (goal is 2.0 to 3.0)  ? ?Continue with current dose of  3 mg on Mondays and Friday, then 2 mg daily.  ? ?Recheck in 1 week ?  ? ?Caryl Pina, MD ?[place] ?05/30/2021, 12:05 PM ? ? ?

## 2021-05-30 NOTE — Telephone Encounter (Signed)
Patient aware.

## 2021-05-31 ENCOUNTER — Encounter (HOSPITAL_COMMUNITY): Payer: Self-pay | Admitting: Emergency Medicine

## 2021-05-31 ENCOUNTER — Other Ambulatory Visit: Payer: Self-pay

## 2021-05-31 ENCOUNTER — Emergency Department (HOSPITAL_COMMUNITY)
Admission: EM | Admit: 2021-05-31 | Discharge: 2021-05-31 | Disposition: A | Discharge status: Home / Self Care | Payer: Medicare Other | Attending: Emergency Medicine | Admitting: Emergency Medicine

## 2021-05-31 ENCOUNTER — Emergency Department (HOSPITAL_COMMUNITY): Payer: Medicare Other

## 2021-05-31 DIAGNOSIS — R109 Unspecified abdominal pain: Secondary | ICD-10-CM | POA: Diagnosis not present

## 2021-05-31 DIAGNOSIS — R21 Rash and other nonspecific skin eruption: Secondary | ICD-10-CM | POA: Insufficient documentation

## 2021-05-31 DIAGNOSIS — Z7901 Long term (current) use of anticoagulants: Secondary | ICD-10-CM | POA: Diagnosis not present

## 2021-05-31 DIAGNOSIS — I4891 Unspecified atrial fibrillation: Secondary | ICD-10-CM | POA: Diagnosis not present

## 2021-05-31 DIAGNOSIS — K5900 Constipation, unspecified: Secondary | ICD-10-CM | POA: Insufficient documentation

## 2021-05-31 DIAGNOSIS — I509 Heart failure, unspecified: Secondary | ICD-10-CM | POA: Insufficient documentation

## 2021-05-31 DIAGNOSIS — X58XXXA Exposure to other specified factors, initial encounter: Secondary | ICD-10-CM | POA: Insufficient documentation

## 2021-05-31 DIAGNOSIS — S32020A Wedge compression fracture of second lumbar vertebra, initial encounter for closed fracture: Secondary | ICD-10-CM | POA: Insufficient documentation

## 2021-05-31 DIAGNOSIS — I110 Hypertensive heart disease with heart failure: Secondary | ICD-10-CM | POA: Diagnosis not present

## 2021-05-31 DIAGNOSIS — E039 Hypothyroidism, unspecified: Secondary | ICD-10-CM | POA: Diagnosis not present

## 2021-05-31 DIAGNOSIS — R1011 Right upper quadrant pain: Secondary | ICD-10-CM | POA: Insufficient documentation

## 2021-05-31 DIAGNOSIS — Z950 Presence of cardiac pacemaker: Secondary | ICD-10-CM | POA: Insufficient documentation

## 2021-05-31 DIAGNOSIS — M542 Cervicalgia: Secondary | ICD-10-CM | POA: Diagnosis not present

## 2021-05-31 DIAGNOSIS — R1031 Right lower quadrant pain: Secondary | ICD-10-CM | POA: Diagnosis not present

## 2021-05-31 DIAGNOSIS — R079 Chest pain, unspecified: Secondary | ICD-10-CM | POA: Insufficient documentation

## 2021-05-31 DIAGNOSIS — S3992XA Unspecified injury of lower back, initial encounter: Secondary | ICD-10-CM | POA: Diagnosis present

## 2021-05-31 HISTORY — DX: Sick sinus syndrome: I49.5

## 2021-05-31 LAB — COMPREHENSIVE METABOLIC PANEL
ALT: 44 U/L (ref 0–44)
AST: 62 U/L — ABNORMAL HIGH (ref 15–41)
Albumin: 4.3 g/dL (ref 3.5–5.0)
Alkaline Phosphatase: 148 U/L — ABNORMAL HIGH (ref 38–126)
Anion gap: 10 (ref 5–15)
BUN: 27 mg/dL — ABNORMAL HIGH (ref 8–23)
CO2: 24 mmol/L (ref 22–32)
Calcium: 9.2 mg/dL (ref 8.9–10.3)
Chloride: 102 mmol/L (ref 98–111)
Creatinine, Ser: 0.78 mg/dL (ref 0.44–1.00)
GFR, Estimated: >60 mL/min (ref >60)
Glucose, Bld: 111 mg/dL — ABNORMAL HIGH (ref 70–99)
Potassium: 4.1 mmol/L (ref 3.5–5.1)
Sodium: 136 mmol/L (ref 135–145)
Total Bilirubin: 1.5 mg/dL — ABNORMAL HIGH (ref 0.3–1.2)
Total Protein: 7 g/dL (ref 6.5–8.1)

## 2021-05-31 LAB — CBC WITH DIFFERENTIAL/PLATELET
Abs Immature Granulocytes: 0.02 10*3/uL (ref 0.00–0.07)
Basophils Absolute: 0.1 10*3/uL (ref 0.0–0.1)
Basophils Relative: 1 %
Eosinophils Absolute: 0.2 10*3/uL (ref 0.0–0.5)
Eosinophils Relative: 2 %
HCT: 49.9 % — ABNORMAL HIGH (ref 36.0–46.0)
Hemoglobin: 16.3 g/dL — ABNORMAL HIGH (ref 12.0–15.0)
Immature Granulocytes: 0 %
Lymphocytes Relative: 18 %
Lymphs Abs: 1.6 10*3/uL (ref 0.7–4.0)
MCH: 31.8 pg (ref 26.0–34.0)
MCHC: 32.7 g/dL (ref 30.0–36.0)
MCV: 97.5 fL (ref 80.0–100.0)
Monocytes Absolute: 0.4 10*3/uL (ref 0.1–1.0)
Monocytes Relative: 4 %
Neutro Abs: 6.6 10*3/uL (ref 1.7–7.7)
Neutrophils Relative %: 75 %
Platelets: 119 10*3/uL — ABNORMAL LOW (ref 150–400)
RBC: 5.12 MIL/uL — ABNORMAL HIGH (ref 3.87–5.11)
RDW: 14.4 % (ref 11.5–15.5)
WBC: 8.9 10*3/uL (ref 4.0–10.5)
nRBC: 0 % (ref 0.0–0.2)

## 2021-05-31 LAB — TROPONIN I (HIGH SENSITIVITY): Troponin I (High Sensitivity): 12 ng/L (ref <18)

## 2021-05-31 LAB — PROTIME-INR
INR: 2.2 — ABNORMAL HIGH (ref 0.8–1.2)
Prothrombin Time: 23.8 seconds — ABNORMAL HIGH (ref 11.4–15.2)

## 2021-05-31 LAB — LIPASE, BLOOD: Lipase: 22 U/L (ref 11–51)

## 2021-05-31 MED ORDER — SODIUM CHLORIDE 0.9 % IV BOLUS
500.0000 mL | Freq: Once | INTRAVENOUS | Status: AC
  Administered 2021-05-31: 500 mL via INTRAVENOUS

## 2021-05-31 MED ORDER — MORPHINE SULFATE (PF) 4 MG/ML IV SOLN
4.0000 mg | Freq: Once | INTRAVENOUS | Status: AC
  Administered 2021-05-31: 4 mg via INTRAVENOUS
  Filled 2021-05-31: qty 1

## 2021-05-31 MED ORDER — ONDANSETRON HCL 4 MG/2ML IJ SOLN
4.0000 mg | Freq: Once | INTRAMUSCULAR | Status: AC
  Administered 2021-05-31: 4 mg via INTRAVENOUS
  Filled 2021-05-31: qty 2

## 2021-05-31 MED ORDER — HYDROXYZINE HCL 25 MG PO TABS
12.5000 mg | ORAL_TABLET | Freq: Once | ORAL | Status: AC
  Administered 2021-05-31: 12.5 mg via ORAL
  Filled 2021-05-31: qty 1

## 2021-05-31 MED ORDER — OXYCODONE-ACETAMINOPHEN 5-325 MG PO TABS
2.0000 | ORAL_TABLET | Freq: Once | ORAL | Status: AC
  Administered 2021-05-31: 2 via ORAL
  Filled 2021-05-31: qty 2

## 2021-05-31 NOTE — ED Provider Notes (Signed)
[facility] EMERGENCY DEPARTMENT Provider Note   CSN: [id number] Arrival date & time: 05/31/21  0157     History  Chief Complaint  Patient presents with   Abdominal Pain    Angela Wong is a 81 y.o. female.  Patient is an 81 year old female with extensive past medical history including atrial fibrillation, congestive heart failure, dilated cardiomyopathy with pacemaker, fibromyalgia, Sjogren's syndrome, Raynaud's disease, mitral valve prolapse.  Patient presenting today with multiple complaints, most notably right flank and abdominal pain.  This started earlier today.  She describes some recent constipation, but was able to have 2 bowel movements today.  The pain is constant and worse when she moves.  She denies any urinary complaints.  Patient also complains of pain in her neck, pain in her chest, rash to her face, and a recent tick bite to her right thigh.  The history is provided by the patient.      Home Medications Prior to Admission medications   Medication Sig Start Date End Date Taking? Authorizing Provider  atenolol (TENORMIN) 25 MG tablet Take 0.5 tablets (12.5 mg total) by mouth 2 (two) times daily. 05/16/21  Yes Hawks, Christy A, FNP  furosemide (LASIX) 40 MG tablet Take 1 tablet (40 mg total) by mouth daily. 02/05/21 05/31/21 Yes Hawks, Christy A, FNP  hydrocortisone-pramoxine [facility]) 2.5-1 % rectal cream Place 1 application. rectally See admin instructions. After each bathroom use 05/01/21  Yes [name] A, FNP  levothyroxine (EUTHYROX) 50 MCG tablet Take 2 tablets (100 mcg total) by mouth every morning. 05/24/21  Yes Hawks, Christy A, FNP  methocarbamol (ROBAXIN) 750 MG tablet Take 1 tablet (750 mg total) by mouth 4 (four) times daily. 03/30/21  Yes Hawks, Christy A, FNP  oxyCODONE-acetaminophen (PERCOCET) 7.5-325 MG tablet Take 1 tablet by mouth every 4 (four) hours as needed for severe pain. 05/28/21  Yes Hawks, Christy A, FNP  predniSONE (DELTASONE) 5 MG tablet  Take 1 tablet (5 mg total) by mouth every morning. 10/10/20  Yes Hawks, Christy A, FNP  RESTASIS 0.05 % ophthalmic emulsion INSTILL 1 DROP INTO EACH EYE TWICE DAILY Patient taking differently: Place 1 drop into both eyes 2 (two) times daily. 11/12/19  Yes Hawks, Christy A, FNP  warfarin (COUMADIN) 1 MG tablet TAKE 4 TABLETS BY MOUTH ON MONDAY, WEDNESDAY AND FRIDAY. TAKE 2 TABLETS ON TUESDAY, THURSDAY, SATURDAY AND SUNDAY 04/24/21  Yes Hawks, Christy A, FNP  albuterol (VENTOLIN HFA) 108 (90 Base) MCG/ACT inhaler INHALE 2 PUFFS BY MOUTH EVERY 6 HOURS AS NEEDED FOR WHEEZING Patient taking differently: Inhale 1-2 puffs into the lungs every 6 (six) hours as needed for wheezing or shortness of breath. 08/10/18   Terald Sleeper, PA-C  azithromycin (ZITHROMAX) 250 MG tablet Take 500 mg once, then 250 mg for four days 05/24/21   [name], FNP  oxyCODONE-acetaminophen (PERCOCET) 7.5-325 MG tablet Take 1 tablet by mouth every 4 (four) hours as needed for severe pain. 05/28/21   [name], FNP  oxyCODONE-acetaminophen (PERCOCET) 7.5-325 MG tablet Take 1 tablet by mouth every 4 (four) hours as needed for severe pain. 05/01/21   [name], FNP      Allergies    Cephalosporins, Ciprofloxacin, Diltiazem, Doxycycline, Horse-derived products, Ketek [telithromycin], Nitrofuran derivatives, Nitrous oxide, Other, Penicillins, Pentazocine, Sulfa antibiotics, Trovan [alatrofloxacin], Vitamin b12, Zinc gelatin [zinc], Amlodipine, Calcium channel blockers, Carvedilol, Clindamycin/lincomycin, Codeine, Cymbalta [duloxetine hcl], Diovan [valsartan], Lisinopril, Omeprazole, Sertraline, Tramadol, and Loteprednol etabonate    Review of Systems  Review of Systems  All other systems reviewed and are negative.  Physical Exam Updated Vital Signs BP (!) 170/96   Pulse 61   Temp 97.6 F (36.4 C) (Oral)   Resp (!) 23   Ht '5\' 4"'$  (1.626 m)   Wt 49.9 kg   SpO2 94%   BMI 18.88 kg/m  Physical Exam Vitals and  nursing note reviewed.  Constitutional:      General: She is not in acute distress.    Appearance: She is well-developed. She is not diaphoretic.     Comments: Patient is a somewhat cachectic 81 year old female in no acute distress.  HENT:     Head: Normocephalic and atraumatic.  Cardiovascular:     Rate and Rhythm: Normal rate and regular rhythm.     Heart sounds: No murmur heard.   No friction rub. No gallop.  Pulmonary:     Effort: Pulmonary effort is normal. No respiratory distress.     Breath sounds: Normal breath sounds. No wheezing.  Abdominal:     General: Bowel sounds are normal. There is no distension.     Palpations: Abdomen is soft.     Tenderness: There is abdominal tenderness in the right upper quadrant and right lower quadrant. There is right CVA tenderness. There is no left CVA tenderness, guarding or rebound.  Musculoskeletal:        General: Normal range of motion.     Cervical back: Normal range of motion and neck supple.  Skin:    General: Skin is warm and dry.  Neurological:     General: No focal deficit present.     Mental Status: She is alert and oriented to person, place, and time.    ED Results / Procedures / Treatments   Labs (all labs ordered are listed, but only abnormal results are displayed) Labs Reviewed  COMPREHENSIVE METABOLIC PANEL  CBC WITH DIFFERENTIAL/PLATELET  LIPASE, BLOOD  URINALYSIS, ROUTINE W REFLEX MICROSCOPIC  TROPONIN I (HIGH SENSITIVITY)    EKG EKG Interpretation  Date/Time:  Thursday May 31 2021 02:17:44 EDT Ventricular Rate:  77 PR Interval:    QRS Duration: 105 QT Interval:  416 QTC Calculation: 446 R Axis:   102 Text Interpretation: Atrial fibrillation Anterolateral infarct, age indeterminate Confirmed by Veryl Speak [phone]) on 05/31/2021 2:27:15 AM  Radiology No results found.  Procedures Procedures    Medications Ordered in ED Medications  morphine (PF) 4 MG/ML injection 4 mg (has no administration in  time range)  ondansetron (ZOFRAN) injection 4 mg (has no administration in time range)  sodium chloride 0.9 % bolus 500 mL (has no administration in time range)    ED Course/ Medical Decision Making/ A&P  This patient presents to the ED for concern of right flank pain, this involves an extensive number of treatment options, and is a complaint that carries with it a high risk of complications and morbidity.  The differential diagnosis includes musculoskeletal etiology, renal calculus, UTI, retroperitoneal hemorrhage,   Co morbidities that complicate the patient evaluation  None   Additional history obtained:  No additional history or external records needed   Lab Tests:  I Ordered, and personally interpreted labs.  The pertinent results include: Unremarkable CBC, CMP, urinalysis   Imaging Studies ordered:  I ordered imaging studies including chest x-ray and renal CT.   I independently visualized and interpreted imaging which showed no acute process on the chest x-ray, but renal CT does suggest an L2 compression fracture that appears new compared  to prior study. I agree with the radiologist interpretation   Cardiac Monitoring: / EKG:  The patient was maintained on a cardiac monitor.  I personally viewed and interpreted the cardiac monitored which showed an underlying rhythm of: Sinus   Consultations Obtained:  No consultations obtained or indicated   Problem List / ED Course / Critical interventions / Medication management  Patient presenting with complaints of flank pain that I suspect is musculoskeletal.  She does have a new appearing compression fracture at L2 that appears new compared with 10/17/2020, but of unknown age otherwise.  Nothing else on the CT scan or laboratory studies suggest an emergent situation.  Patient seems more comfortable after receiving pain medication and I believe can safely be discharged I have found no admissible diagnosis. I ordered medication  including morphine for pain Reevaluation of the patient after these medicines showed that the patient improved I have reviewed the patients home medicines and have made adjustments as needed   Social Determinants of Health:  None   Test / Admission - Considered:  Patient to be discharged with pain medication and follow-up as needed.   Final Clinical Impression(s) / ED Diagnoses Final diagnoses:  None    Rx / DC Orders ED Discharge Orders     None         Veryl Speak, MD 05/31/21 [phone]

## 2021-05-31 NOTE — ED Triage Notes (Signed)
Pt c/o abd pain and right flank pain all day. Pt states she is having a hard time having a bowel movement.

## 2021-05-31 NOTE — Discharge Instructions (Signed)
Continue taking Percocet as previously prescribed.  Follow-up with primary doctor if symptoms are not improving in the next few days, and return to the ER if symptoms significantly worsen or change.

## 2021-06-05 ENCOUNTER — Telehealth: Payer: Self-pay | Admitting: *Deleted

## 2021-06-05 DIAGNOSIS — I4821 Permanent atrial fibrillation: Secondary | ICD-10-CM

## 2021-06-05 MED ORDER — HYDROCORT-PRAMOXINE (PERIANAL) 2.5-1 % EX CREA: 1.0000 "application " | TOPICAL_CREAM | CUTANEOUS | 0 refills | Status: DC

## 2021-06-05 MED ORDER — PREDNISONE 5 MG PO TABS: 5.0000 mg | ORAL_TABLET | Freq: Every morning | ORAL | 0 refills | Status: DC

## 2021-06-05 NOTE — Telephone Encounter (Signed)
TC from pt, spoke with switchboard INR today was 2.0  Pt's dose is 3 mg Mon & Fri, 2 mg daily Pt started Zpak yesterday so she took 2 mg instead of 3 mg dose (Cathy called [facility] about getting faxed results, they have not received new enrollment form yet. Sent fax directly to them)  Also needs refills on Hydrocortisone-pramoxine - would like 2 tubes at a time & her Prednisone 5 mg 1 QAM for her autoimmune that she has been on for years.

## 2021-06-05 NOTE — Addendum Note (Signed)
Addended by: Loman Brooklyn on: 06/05/2021 01:12 PM   Modules accepted: Orders

## 2021-06-05 NOTE — Telephone Encounter (Signed)
Pt aware of results and recommendations and voiced understanding. 

## 2021-06-05 NOTE — Telephone Encounter (Signed)
INR was 2.0 today (goal is 2.0 to 3.0)   Continue with current dose of  3 mg on Mondays and Friday with 2 mg all other days    Recheck in 1 week   Refills sent to [facility].

## 2021-06-06 ENCOUNTER — Emergency Department (HOSPITAL_COMMUNITY)
Admission: EM | Admit: 2021-06-06 | Discharge: 2021-06-07 | Disposition: A | Discharge status: Home / Self Care | Payer: Medicare Other | Attending: Emergency Medicine | Admitting: Emergency Medicine

## 2021-06-06 ENCOUNTER — Emergency Department (HOSPITAL_COMMUNITY): Payer: Medicare Other

## 2021-06-06 ENCOUNTER — Encounter (HOSPITAL_COMMUNITY): Payer: Self-pay | Admitting: *Deleted

## 2021-06-06 ENCOUNTER — Other Ambulatory Visit: Payer: Self-pay

## 2021-06-06 DIAGNOSIS — Z7952 Long term (current) use of systemic steroids: Secondary | ICD-10-CM | POA: Insufficient documentation

## 2021-06-06 DIAGNOSIS — R109 Unspecified abdominal pain: Secondary | ICD-10-CM | POA: Diagnosis present

## 2021-06-06 DIAGNOSIS — J449 Chronic obstructive pulmonary disease, unspecified: Secondary | ICD-10-CM | POA: Diagnosis not present

## 2021-06-06 DIAGNOSIS — G894 Chronic pain syndrome: Secondary | ICD-10-CM | POA: Insufficient documentation

## 2021-06-06 DIAGNOSIS — Z7901 Long term (current) use of anticoagulants: Secondary | ICD-10-CM | POA: Diagnosis not present

## 2021-06-06 DIAGNOSIS — R1084 Generalized abdominal pain: Secondary | ICD-10-CM | POA: Diagnosis not present

## 2021-06-06 DIAGNOSIS — Z950 Presence of cardiac pacemaker: Secondary | ICD-10-CM | POA: Insufficient documentation

## 2021-06-06 DIAGNOSIS — R0789 Other chest pain: Secondary | ICD-10-CM | POA: Diagnosis not present

## 2021-06-06 LAB — COMPREHENSIVE METABOLIC PANEL
ALT: 24 U/L (ref 0–44)
AST: 23 U/L (ref 15–41)
Albumin: 3.8 g/dL (ref 3.5–5.0)
Alkaline Phosphatase: 141 U/L — ABNORMAL HIGH (ref 38–126)
Anion gap: 9 (ref 5–15)
BUN: 21 mg/dL (ref 8–23)
CO2: 22 mmol/L (ref 22–32)
Calcium: 8.6 mg/dL — ABNORMAL LOW (ref 8.9–10.3)
Chloride: 102 mmol/L (ref 98–111)
Creatinine, Ser: 0.72 mg/dL (ref 0.44–1.00)
GFR, Estimated: >60 mL/min (ref >60)
Glucose, Bld: 89 mg/dL (ref 70–99)
Potassium: 3.8 mmol/L (ref 3.5–5.1)
Sodium: 133 mmol/L — ABNORMAL LOW (ref 135–145)
Total Bilirubin: 1.4 mg/dL — ABNORMAL HIGH (ref 0.3–1.2)
Total Protein: 6.3 g/dL — ABNORMAL LOW (ref 6.5–8.1)

## 2021-06-06 LAB — CBC WITH DIFFERENTIAL/PLATELET
Abs Immature Granulocytes: 0.03 10*3/uL (ref 0.00–0.07)
Basophils Absolute: 0.1 10*3/uL (ref 0.0–0.1)
Basophils Relative: 1 %
Eosinophils Absolute: 0.2 10*3/uL (ref 0.0–0.5)
Eosinophils Relative: 2 %
HCT: 47.3 % — ABNORMAL HIGH (ref 36.0–46.0)
Hemoglobin: 15.6 g/dL — ABNORMAL HIGH (ref 12.0–15.0)
Immature Granulocytes: 0 %
Lymphocytes Relative: 15 %
Lymphs Abs: 1.6 10*3/uL (ref 0.7–4.0)
MCH: 31.8 pg (ref 26.0–34.0)
MCHC: 33 g/dL (ref 30.0–36.0)
MCV: 96.5 fL (ref 80.0–100.0)
Monocytes Absolute: 0.6 10*3/uL (ref 0.1–1.0)
Monocytes Relative: 6 %
Neutro Abs: 8 10*3/uL — ABNORMAL HIGH (ref 1.7–7.7)
Neutrophils Relative %: 76 %
Platelets: 129 10*3/uL — ABNORMAL LOW (ref 150–400)
RBC: 4.9 MIL/uL (ref 3.87–5.11)
RDW: 14.5 % (ref 11.5–15.5)
WBC: 10.4 10*3/uL (ref 4.0–10.5)
nRBC: 0 % (ref 0.0–0.2)

## 2021-06-06 LAB — PROTIME-INR
INR: 2.1 — ABNORMAL HIGH (ref 0.8–1.2)
Prothrombin Time: 23.5 seconds — ABNORMAL HIGH (ref 11.4–15.2)

## 2021-06-06 LAB — TROPONIN I (HIGH SENSITIVITY)
Troponin I (High Sensitivity): 11 ng/L (ref <18)
Troponin I (High Sensitivity): 11 ng/L (ref <18)

## 2021-06-06 LAB — LIPASE, BLOOD: Lipase: 21 U/L (ref 11–51)

## 2021-06-06 MED ORDER — SODIUM CHLORIDE 0.9 % IV SOLN: INTRAVENOUS | Status: DC

## 2021-06-06 MED ORDER — ONDANSETRON HCL 4 MG/2ML IJ SOLN
4.0000 mg | Freq: Once | INTRAMUSCULAR | Status: AC
  Administered 2021-06-06: 4 mg via INTRAVENOUS
  Filled 2021-06-06: qty 2

## 2021-06-06 MED ORDER — HYDROMORPHONE HCL 1 MG/ML IJ SOLN
1.0000 mg | Freq: Once | INTRAMUSCULAR | Status: AC
  Administered 2021-06-06: 1 mg via INTRAVENOUS
  Filled 2021-06-06: qty 1

## 2021-06-06 NOTE — ED Notes (Signed)
Pt gone to CT 

## 2021-06-06 NOTE — ED Triage Notes (Signed)
Pt c/o right flank pain and multiple complaints;

## 2021-06-06 NOTE — ED Provider Notes (Signed)
[facility] EMERGENCY DEPARTMENT Provider Note   CSN: [id number] Arrival date & time: 06/06/21  1404     History {Add pertinent medical, surgical, social history, OB history to HPI:1} Chief Complaint  Patient presents with   Flank Pain    Angela Wong is a 81 y.o. female.  HPI     Home Medications Prior to Admission medications   Medication Sig Start Date End Date Taking? Authorizing Provider  albuterol (VENTOLIN HFA) 108 (90 Base) MCG/ACT inhaler INHALE 2 PUFFS BY MOUTH EVERY 6 HOURS AS NEEDED FOR WHEEZING Patient taking differently: Inhale 1-2 puffs into the lungs every 6 (six) hours as needed for wheezing or shortness of breath. 08/10/18   Terald Sleeper, PA-C  atenolol (TENORMIN) 25 MG tablet Take 0.5 tablets (12.5 mg total) by mouth 2 (two) times daily. 05/16/21   [name], FNP  azithromycin (ZITHROMAX) 250 MG tablet Take 500 mg once, then 250 mg for four days 05/24/21   [name] A, FNP  furosemide (LASIX) 40 MG tablet Take 1 tablet (40 mg total) by mouth daily. 02/05/21 05/31/21  [name] A, FNP  hydrocortisone-pramoxine [facility]) 2.5-1 % rectal cream Place 1 application. rectally See admin instructions. After each bathroom use 06/05/21   Loman Brooklyn, FNP  levothyroxine (EUTHYROX) 50 MCG tablet Take 2 tablets (100 mcg total) by mouth every morning. 05/24/21   [name], FNP  methocarbamol (ROBAXIN) 750 MG tablet Take 1 tablet (750 mg total) by mouth 4 (four) times daily. 03/30/21   [name], FNP  oxyCODONE-acetaminophen (PERCOCET) 7.5-325 MG tablet Take 1 tablet by mouth every 4 (four) hours as needed for severe pain. 05/28/21   [name], FNP  oxyCODONE-acetaminophen (PERCOCET) 7.5-325 MG tablet Take 1 tablet by mouth every 4 (four) hours as needed for severe pain. 05/28/21   [name], FNP  oxyCODONE-acetaminophen (PERCOCET) 7.5-325 MG tablet Take 1 tablet by mouth every 4 (four) hours as needed for severe pain. 05/01/21    [name], FNP  predniSONE (DELTASONE) 5 MG tablet Take 1 tablet (5 mg total) by mouth every morning. 06/05/21   Loman Brooklyn, FNP  RESTASIS 0.05 % ophthalmic emulsion INSTILL 1 DROP INTO EACH EYE TWICE DAILY Patient taking differently: Place 1 drop into both eyes 2 (two) times daily. 11/12/19   [name] A, FNP  warfarin (COUMADIN) 1 MG tablet TAKE 4 TABLETS BY MOUTH ON MONDAY, WEDNESDAY AND FRIDAY. TAKE 2 TABLETS ON TUESDAY, THURSDAY, SATURDAY AND SUNDAY 04/24/21   [name] A, FNP      Allergies    Cephalosporins, Ciprofloxacin, Diltiazem, Doxycycline, Horse-derived products, Ketek [telithromycin], Nitrofuran derivatives, Nitrous oxide, Other, Penicillins, Pentazocine, Sulfa antibiotics, Trovan [alatrofloxacin], Vitamin b12, Zinc gelatin [zinc], Amlodipine, Calcium channel blockers, Carvedilol, Clindamycin/lincomycin, Codeine, Cymbalta [duloxetine hcl], Diovan [valsartan], Lisinopril, Omeprazole, Sertraline, Tramadol, and Loteprednol etabonate    Review of Systems   Review of Systems  Constitutional:  Negative for chills and fever.  HENT:  Negative for ear pain and sore throat.   Eyes:  Negative for pain and visual disturbance.  Respiratory:  Positive for shortness of breath. Negative for cough.   Cardiovascular:  Positive for chest pain. Negative for palpitations.  Gastrointestinal:  Positive for nausea and vomiting. Negative for abdominal pain.  Genitourinary:  Positive for flank pain. Negative for dysuria and hematuria.  Musculoskeletal:  Positive for back pain. Negative for arthralgias.  Skin:  Negative for color change and rash.  Neurological:  Negative for  seizures and syncope.  All other systems reviewed and are negative.  Physical Exam Updated Vital Signs BP (!) 141/82   Pulse 63   Resp 17   Ht 1.626 m ('5\' 4"'$ )   Wt 49.8 kg   SpO2 95%   BMI 18.85 kg/m  Physical Exam  ED Results / Procedures / Treatments   Labs (all labs ordered are listed, but only  abnormal results are displayed) Labs Reviewed - No data to display  EKG None  Radiology No results found.  Procedures Procedures  {Document cardiac monitor, telemetry assessment procedure when appropriate:1}  Medications Ordered in ED Medications - No data to display  ED Course/ Medical Decision Making/ A&P                           Medical Decision Making Amount and/or Complexity of Data Reviewed Labs: ordered. Radiology: ordered.  Risk Prescription drug management.   ***  {Document critical care time when appropriate:1} {Document review of labs and clinical decision tools ie heart score, Chads2Vasc2 etc:1}  {Document your independent review of radiology images, and any outside records:1} {Document your discussion with family members, caretakers, and with consultants:1} {Document social determinants of health affecting pt's care:1} {Document your decision making why or why not admission, treatments were needed:1} Final Clinical Impression(s) / ED Diagnoses Final diagnoses:  None    Rx / DC Orders ED Discharge Orders     None

## 2021-06-06 NOTE — ED Notes (Signed)
Barrier cream applied to pt's buttocks per request. Pt did not want a sacral foam applied at this time. Pt resting comfortably in bed and waiting to see the EDP

## 2021-06-07 DIAGNOSIS — R1084 Generalized abdominal pain: Secondary | ICD-10-CM | POA: Diagnosis not present

## 2021-06-07 MED ORDER — HYDROMORPHONE HCL 1 MG/ML IJ SOLN
1.0000 mg | Freq: Once | INTRAMUSCULAR | Status: AC
  Administered 2021-06-07: 1 mg via INTRAVENOUS
  Filled 2021-06-07: qty 1

## 2021-06-07 MED ORDER — METHOCARBAMOL 500 MG PO TABS
750.0000 mg | ORAL_TABLET | Freq: Once | ORAL | Status: AC
  Administered 2021-06-07: 750 mg via ORAL
  Filled 2021-06-07: qty 2

## 2021-06-07 MED ORDER — OXYCODONE-ACETAMINOPHEN 5-325 MG PO TABS
2.0000 | ORAL_TABLET | Freq: Once | ORAL | Status: AC
  Administered 2021-06-07: 2 via ORAL
  Filled 2021-06-07: qty 2

## 2021-06-07 MED ORDER — ONDANSETRON HCL 4 MG/2ML IJ SOLN
4.0000 mg | Freq: Once | INTRAMUSCULAR | Status: AC
  Administered 2021-06-07: 4 mg via INTRAVENOUS
  Filled 2021-06-07: qty 2

## 2021-06-07 NOTE — Discharge Instructions (Addendum)
Work-up here tonight without any acute findings.  No signs of pneumonia.  No signs of any acute cardiac event.  CT scan of the abdomen without any acute findings.  Take your pain medicines that you have at home and follow-up with your doctors.  Take an appointment to follow-up with your cardiologist.  Return for any new or worse chest pain.

## 2021-06-07 NOTE — ED Notes (Signed)
Pt given discharge papers

## 2021-06-12 ENCOUNTER — Telehealth: Payer: Self-pay | Admitting: *Deleted

## 2021-06-12 DIAGNOSIS — I4821 Permanent atrial fibrillation: Secondary | ICD-10-CM

## 2021-06-12 NOTE — Telephone Encounter (Signed)
Pt aware of provider feedback and also requesting appt with [facility] for lower leg edema and redness. Pt scheduled with [facility] Thursday 06/14/21 at 2:40.

## 2021-06-12 NOTE — Telephone Encounter (Signed)
Description   INR was  2.4  today (goal is 2.0 to 3.0)   Continue with current dose of  3 mg on Mondays and Friday with 2 mg all other days    Recheck in 1 week

## 2021-06-12 NOTE — Telephone Encounter (Signed)
Fax received 5/30 at 949 am   [facility] PT/INR self testing service Test date/time 06/12/21@ 945 am  INR 2.4

## 2021-06-14 ENCOUNTER — Ambulatory Visit: Payer: Medicare Other | Admitting: Family

## 2021-06-15 ENCOUNTER — Telehealth: Payer: Self-pay | Admitting: *Deleted

## 2021-06-15 MED ORDER — ONDANSETRON HCL 4 MG PO TABS: 4.0000 mg | ORAL_TABLET | Freq: Three times a day (TID) | ORAL | 0 refills | Status: DC | PRN

## 2021-06-15 NOTE — Telephone Encounter (Signed)
I will send in Zofran, but can not send in controlled medications without appt.

## 2021-06-15 NOTE — Telephone Encounter (Signed)
TC from Serious Illness (Palliative Care) New NP w/ them is having issues prescribing for pt and her covering is out of town. Wanting to know if PCP would send in for pt Zofran 4 mg 1 Q6 prn and Morphine sulfate ER 15 mg 1 Q12 for her kyphosis & SOB to [facility]. Please advise and let them know our decision.

## 2021-06-15 NOTE — Addendum Note (Signed)
Addended by: [name] A on: 06/15/2021 04:10 PM   Modules accepted: Orders

## 2021-06-19 ENCOUNTER — Other Ambulatory Visit: Payer: Self-pay | Admitting: Family Medicine

## 2021-06-19 ENCOUNTER — Telehealth: Payer: Self-pay | Admitting: *Deleted

## 2021-06-19 ENCOUNTER — Ambulatory Visit: Payer: Medicare Other

## 2021-06-19 DIAGNOSIS — I4821 Permanent atrial fibrillation: Secondary | ICD-10-CM

## 2021-06-19 NOTE — Telephone Encounter (Signed)
Description   INR was 2.7  today (goal is 2.0 to 3.0)   Continue with current dose of  3 mg on Mondays and Friday with 2 mg all other days    Recheck in 1 week

## 2021-06-19 NOTE — Telephone Encounter (Signed)
Patient aware.

## 2021-06-19 NOTE — Telephone Encounter (Signed)
Fax received [facility] PT/INR self testing service Test date/time 06/19/21 806 am INR 2.7

## 2021-06-19 NOTE — Telephone Encounter (Signed)
Serious illness care informed

## 2021-06-26 ENCOUNTER — Telehealth: Payer: Self-pay | Admitting: *Deleted

## 2021-06-26 DIAGNOSIS — I4821 Permanent atrial fibrillation: Secondary | ICD-10-CM

## 2021-06-26 NOTE — Telephone Encounter (Signed)
Fax received [facility] PT/INR self testing service Test date/time 06/26/21 900 am INR 2.7

## 2021-06-26 NOTE — Telephone Encounter (Signed)
Description   INR was 2.7  today (goal is 2.0 to 3.0)   Continue with current dose of  3 mg on Mondays and Friday with 2 mg all other days    Recheck in 1 week

## 2021-06-26 NOTE — Telephone Encounter (Signed)
Patient aware and verbalizes understanding. 

## 2021-07-03 ENCOUNTER — Telehealth: Payer: Self-pay | Admitting: *Deleted

## 2021-07-03 DIAGNOSIS — I4821 Permanent atrial fibrillation: Secondary | ICD-10-CM

## 2021-07-03 NOTE — Telephone Encounter (Signed)
INR therapeutic.  No changes

## 2021-07-03 NOTE — Telephone Encounter (Signed)
Fax received [facility] PT/INR self testing service Test date/time 07/03/21 10151 am INR 2.6

## 2021-07-03 NOTE — Telephone Encounter (Signed)
PT AWARE  

## 2021-07-11 ENCOUNTER — Telehealth: Payer: Self-pay | Admitting: *Deleted

## 2021-07-11 DIAGNOSIS — I4821 Permanent atrial fibrillation: Secondary | ICD-10-CM

## 2021-07-11 NOTE — Telephone Encounter (Signed)
Patient informed. 

## 2021-07-11 NOTE — Telephone Encounter (Signed)
Fax received [facility] PT/INR self testing service Test date/time 07/10/21 540 pm INR 2.6

## 2021-07-18 ENCOUNTER — Inpatient Hospital Stay (HOSPITAL_COMMUNITY)
Admission: EM | Admit: 2021-07-18 | Discharge: 2021-07-24 | DRG: 291 | Disposition: A | Discharge status: Home Health | Payer: Medicare Other | Attending: Family Medicine | Admitting: Family Medicine

## 2021-07-18 ENCOUNTER — Other Ambulatory Visit: Payer: Self-pay

## 2021-07-18 ENCOUNTER — Emergency Department (HOSPITAL_COMMUNITY): Payer: Medicare Other

## 2021-07-18 ENCOUNTER — Encounter (HOSPITAL_COMMUNITY): Payer: Self-pay | Admitting: Emergency Medicine

## 2021-07-18 DIAGNOSIS — Z950 Presence of cardiac pacemaker: Secondary | ICD-10-CM

## 2021-07-18 DIAGNOSIS — Z880 Allergy status to penicillin: Secondary | ICD-10-CM | POA: Diagnosis not present

## 2021-07-18 DIAGNOSIS — R609 Edema, unspecified: Secondary | ICD-10-CM | POA: Diagnosis present

## 2021-07-18 DIAGNOSIS — Z8249 Family history of ischemic heart disease and other diseases of the circulatory system: Secondary | ICD-10-CM

## 2021-07-18 DIAGNOSIS — I509 Heart failure, unspecified: Secondary | ICD-10-CM | POA: Diagnosis not present

## 2021-07-18 DIAGNOSIS — I420 Dilated cardiomyopathy: Secondary | ICD-10-CM | POA: Diagnosis present

## 2021-07-18 DIAGNOSIS — Z881 Allergy status to other antibiotic agents status: Secondary | ICD-10-CM

## 2021-07-18 DIAGNOSIS — E039 Hypothyroidism, unspecified: Secondary | ICD-10-CM | POA: Diagnosis present

## 2021-07-18 DIAGNOSIS — K219 Gastro-esophageal reflux disease without esophagitis: Secondary | ICD-10-CM | POA: Diagnosis present

## 2021-07-18 DIAGNOSIS — I872 Venous insufficiency (chronic) (peripheral): Secondary | ICD-10-CM | POA: Diagnosis present

## 2021-07-18 DIAGNOSIS — R600 Localized edema: Secondary | ICD-10-CM | POA: Diagnosis present

## 2021-07-18 DIAGNOSIS — Z882 Allergy status to sulfonamides status: Secondary | ICD-10-CM | POA: Diagnosis not present

## 2021-07-18 DIAGNOSIS — M797 Fibromyalgia: Secondary | ICD-10-CM | POA: Diagnosis present

## 2021-07-18 DIAGNOSIS — I5041 Acute combined systolic (congestive) and diastolic (congestive) heart failure: Secondary | ICD-10-CM | POA: Diagnosis not present

## 2021-07-18 DIAGNOSIS — J9601 Acute respiratory failure with hypoxia: Secondary | ICD-10-CM | POA: Diagnosis present

## 2021-07-18 DIAGNOSIS — Z91048 Other nonmedicinal substance allergy status: Secondary | ICD-10-CM | POA: Diagnosis not present

## 2021-07-18 DIAGNOSIS — I110 Hypertensive heart disease with heart failure: Principal | ICD-10-CM | POA: Diagnosis present

## 2021-07-18 DIAGNOSIS — I4821 Permanent atrial fibrillation: Secondary | ICD-10-CM | POA: Diagnosis present

## 2021-07-18 DIAGNOSIS — M0579 Rheumatoid arthritis with rheumatoid factor of multiple sites without organ or systems involvement: Secondary | ICD-10-CM

## 2021-07-18 DIAGNOSIS — Z681 Body mass index (BMI) 19 or less, adult: Secondary | ICD-10-CM

## 2021-07-18 DIAGNOSIS — E213 Hyperparathyroidism, unspecified: Secondary | ICD-10-CM | POA: Diagnosis present

## 2021-07-18 DIAGNOSIS — Z888 Allergy status to other drugs, medicaments and biological substances status: Secondary | ICD-10-CM

## 2021-07-18 DIAGNOSIS — Z79890 Hormone replacement therapy (postmenopausal): Secondary | ICD-10-CM

## 2021-07-18 DIAGNOSIS — J449 Chronic obstructive pulmonary disease, unspecified: Secondary | ICD-10-CM | POA: Diagnosis present

## 2021-07-18 DIAGNOSIS — M3500 Sicca syndrome, unspecified: Secondary | ICD-10-CM | POA: Diagnosis present

## 2021-07-18 DIAGNOSIS — L03119 Cellulitis of unspecified part of limb: Principal | ICD-10-CM

## 2021-07-18 DIAGNOSIS — I7300 Raynaud's syndrome without gangrene: Secondary | ICD-10-CM | POA: Diagnosis present

## 2021-07-18 DIAGNOSIS — Z20822 Contact with and (suspected) exposure to covid-19: Secondary | ICD-10-CM | POA: Diagnosis present

## 2021-07-18 DIAGNOSIS — Z79899 Other long term (current) drug therapy: Secondary | ICD-10-CM

## 2021-07-18 DIAGNOSIS — E43 Unspecified severe protein-calorie malnutrition: Secondary | ICD-10-CM | POA: Diagnosis present

## 2021-07-18 DIAGNOSIS — I5023 Acute on chronic systolic (congestive) heart failure: Secondary | ICD-10-CM | POA: Diagnosis present

## 2021-07-18 DIAGNOSIS — I5043 Acute on chronic combined systolic (congestive) and diastolic (congestive) heart failure: Secondary | ICD-10-CM | POA: Diagnosis not present

## 2021-07-18 DIAGNOSIS — E876 Hypokalemia: Secondary | ICD-10-CM | POA: Diagnosis present

## 2021-07-18 DIAGNOSIS — Z7189 Other specified counseling: Secondary | ICD-10-CM | POA: Diagnosis not present

## 2021-07-18 DIAGNOSIS — Z515 Encounter for palliative care: Secondary | ICD-10-CM | POA: Diagnosis not present

## 2021-07-18 DIAGNOSIS — Z885 Allergy status to narcotic agent status: Secondary | ICD-10-CM

## 2021-07-18 DIAGNOSIS — G8929 Other chronic pain: Secondary | ICD-10-CM | POA: Diagnosis present

## 2021-07-18 DIAGNOSIS — I495 Sick sinus syndrome: Secondary | ICD-10-CM | POA: Diagnosis present

## 2021-07-18 DIAGNOSIS — Z7901 Long term (current) use of anticoagulants: Secondary | ICD-10-CM

## 2021-07-18 DIAGNOSIS — Z8543 Personal history of malignant neoplasm of ovary: Secondary | ICD-10-CM

## 2021-07-18 DIAGNOSIS — M1990 Unspecified osteoarthritis, unspecified site: Secondary | ICD-10-CM

## 2021-07-18 DIAGNOSIS — I252 Old myocardial infarction: Secondary | ICD-10-CM

## 2021-07-18 LAB — PROTIME-INR
INR: 1.9 — ABNORMAL HIGH (ref 0.8–1.2)
Prothrombin Time: 21.9 seconds — ABNORMAL HIGH (ref 11.4–15.2)

## 2021-07-18 LAB — CBC
HCT: 42.5 % (ref 36.0–46.0)
Hemoglobin: 13.8 g/dL (ref 12.0–15.0)
MCH: 32.1 pg (ref 26.0–34.0)
MCHC: 32.5 g/dL (ref 30.0–36.0)
MCV: 98.8 fL (ref 80.0–100.0)
Platelets: 107 10*3/uL — ABNORMAL LOW (ref 150–400)
RBC: 4.3 MIL/uL (ref 3.87–5.11)
RDW: 14 % (ref 11.5–15.5)
WBC: 5.4 10*3/uL (ref 4.0–10.5)
nRBC: 0 % (ref 0.0–0.2)

## 2021-07-18 LAB — MAGNESIUM: Magnesium: 2 mg/dL (ref 1.7–2.4)

## 2021-07-18 LAB — BASIC METABOLIC PANEL
Anion gap: 7 (ref 5–15)
BUN: 19 mg/dL (ref 8–23)
CO2: 26 mmol/L (ref 22–32)
Calcium: 8.6 mg/dL — ABNORMAL LOW (ref 8.9–10.3)
Chloride: 104 mmol/L (ref 98–111)
Creatinine, Ser: 0.65 mg/dL (ref 0.44–1.00)
GFR, Estimated: >60 mL/min (ref >60)
Glucose, Bld: 85 mg/dL (ref 70–99)
Potassium: 3.4 mmol/L — ABNORMAL LOW (ref 3.5–5.1)
Sodium: 137 mmol/L (ref 135–145)

## 2021-07-18 LAB — BRAIN NATRIURETIC PEPTIDE: B Natriuretic Peptide: 1463 pg/mL — ABNORMAL HIGH (ref 0.0–100.0)

## 2021-07-18 MED ORDER — MORPHINE SULFATE (PF) 4 MG/ML IV SOLN
4.0000 mg | Freq: Once | INTRAVENOUS | Status: AC
  Administered 2021-07-18: 4 mg via INTRAVENOUS
  Filled 2021-07-18: qty 1

## 2021-07-18 MED ORDER — VANCOMYCIN HCL IN DEXTROSE 1-5 GM/200ML-% IV SOLN
1000.0000 mg | Freq: Once | INTRAVENOUS | Status: AC
  Administered 2021-07-18: 1000 mg via INTRAVENOUS
  Filled 2021-07-18: qty 200

## 2021-07-18 MED ORDER — POTASSIUM CHLORIDE CRYS ER 20 MEQ PO TBCR
40.0000 meq | EXTENDED_RELEASE_TABLET | Freq: Once | ORAL | Status: DC
  Filled 2021-07-18: qty 2

## 2021-07-18 MED ORDER — MORPHINE SULFATE (PF) 4 MG/ML IV SOLN
4.0000 mg | Freq: Once | INTRAVENOUS | Status: AC
  Administered 2021-07-19: 4 mg via INTRAVENOUS
  Filled 2021-07-18: qty 1

## 2021-07-18 MED ORDER — FUROSEMIDE 10 MG/ML IJ SOLN
40.0000 mg | Freq: Once | INTRAMUSCULAR | Status: AC
  Administered 2021-07-19: 40 mg via INTRAVENOUS
  Filled 2021-07-18: qty 4

## 2021-07-18 NOTE — ED Provider Notes (Signed)
[facility] EMERGENCY DEPARTMENT Provider Note   CSN: [id number] Arrival date & time: 07/18/21  2116     History {Add pertinent medical, surgical, social history, OB history to HPI:1} Chief Complaint  Patient presents with   Leg Swelling    Angela Wong is a 81 y.o. female.  HPI  Patient has history of multiple medical problems including A-fib, Sjogren's syndrome, Raynaud's disease, irritable bowel syndrome, restless leg syndrome, cerebral vasculitis, osteoarthritis, ovarian cancer, COPD, GERD, fibromyalgia, dilated cardiomyopathy who presents with complaints primarily of leg swelling and pain.  She has also noticed some periorbital swelling.  Patient states she has been having some issues with leg swelling and redness that has been increasing over the last couple of weeks.  Patient states she has not seen anyone for this prior to today.  The pain and then redness has been increasing in severity.  Patient states this has also been exacerbated by family members accidentally stepping on her feet.  She is having increasing pain in her toes associated with that.  Patient feels like her breathing has gotten worse and she has been having some shortness of breath.  She denies chronic oxygen use but states the paramedics noted her oxygen level was low today.  She is also noticed some swelling around her eyes that she is attributed to sinus issues.  Patient states she does have history of sinusitis and has been on antibiotics in the past.  Home Medications Prior to Admission medications   Medication Sig Start Date End Date Taking? Authorizing Provider  albuterol (VENTOLIN HFA) 108 (90 Base) MCG/ACT inhaler INHALE 2 PUFFS BY MOUTH EVERY 6 HOURS AS NEEDED FOR WHEEZING Patient not taking: Reported on 06/06/2021 08/10/18   Terald Sleeper, PA-C  atenolol (TENORMIN) 25 MG tablet Take 0.5 tablets (12.5 mg total) by mouth 2 (two) times daily. Patient taking differently: Take 25 mg by mouth daily. 05/16/21    [name], FNP  azithromycin (ZITHROMAX) 250 MG tablet Take 500 mg once, then 250 mg for four days Patient not taking: Reported on 06/06/2021 05/24/21   [name], FNP  furosemide (LASIX) 40 MG tablet Take 1 tablet (40 mg total) by mouth daily. Patient taking differently: Take 20-40 mg by mouth daily. 02/05/21 06/06/21  [name], FNP  hydrocortisone-pramoxine [facility]) 2.5-1 % rectal cream PLACE 1 APPLICATION RECTALLY SEE ADMINISTRATION INSTRUCTIONS, AFTER EACH BATHROOM USE 06/21/21   [name], FNP  levothyroxine (EUTHYROX) 50 MCG tablet Take 2 tablets (100 mcg total) by mouth every morning. 05/24/21   [name], FNP  methocarbamol (ROBAXIN) 750 MG tablet Take 1 tablet (750 mg total) by mouth 4 (four) times daily. 03/30/21   [name] A, FNP  nitroGLYCERIN (NITROSTAT) 0.3 MG SL tablet Place under the tongue. 02/28/21   [provider]  ondansetron (ZOFRAN) 4 MG tablet Take 1 tablet (4 mg total) by mouth every 8 (eight) hours as needed for nausea or vomiting. 06/15/21   [name], FNP  oxyCODONE-acetaminophen (PERCOCET) 7.5-325 MG tablet Take 1 tablet by mouth every 4 (four) hours as needed for severe pain. 05/28/21   [name], FNP  oxyCODONE-acetaminophen (PERCOCET) 7.5-325 MG tablet Take 1 tablet by mouth every 4 (four) hours as needed for severe pain. Patient not taking: Reported on 06/06/2021 05/28/21   [name], FNP  oxyCODONE-acetaminophen (PERCOCET) 7.5-325 MG tablet Take 1 tablet by mouth every 4 (four) hours as needed for severe pain. Patient not taking: Reported  on 06/06/2021 05/01/21   [name], FNP  predniSONE (DELTASONE) 5 MG tablet Take 1 tablet (5 mg total) by mouth every morning. 06/05/21   Loman Brooklyn, FNP  RESTASIS 0.05 % ophthalmic emulsion INSTILL 1 DROP INTO [facility] EYE TWICE DAILY Patient not taking: Reported on 06/06/2021 11/12/19   [name] A, FNP  warfarin (COUMADIN) 1 MG tablet TAKE 4 TABLETS BY  MOUTH ON MONDAY, WEDNESDAY AND FRIDAY. TAKE 2 TABLETS ON TUESDAY, THURSDAY, SATURDAY AND SUNDAY Patient taking differently: TAKE 3 TABLETS BY MOUTH ON MONDAY, AND FRIDAY. TAKE 2 TABLETS ON TUESDAY,Wednesday, [place]ene Ripple AND SUNDAY 04/24/21   [name] A, FNP      Allergies    Cephalosporins, Ciprofloxacin, Diltiazem, Doxycycline, Horse-derived products, Ketek [telithromycin], Nitrofuran derivatives, Nitrous oxide, Other, Penicillins, Pentazocine, Sulfa antibiotics, Trovan [alatrofloxacin], Vitamin b12, Zinc gelatin [zinc], Amlodipine, Calcium channel blockers, Carvedilol, Clindamycin/lincomycin, Codeine, Cymbalta [duloxetine hcl], Diovan [valsartan], Lisinopril, Omeprazole, Sertraline, Tramadol, and Loteprednol etabonate    Review of Systems   Review of Systems  Physical Exam Updated Vital Signs BP 116/87   Pulse 64   Temp 98.2 F (36.8 C)   Resp 15   Ht 1.626 m ('5\' 4"'$ )   Wt 49 kg   SpO2 98%   BMI 18.54 kg/m  Physical Exam Vitals and nursing note reviewed.  Constitutional:      Appearance: She is well-developed. She is not ill-appearing.  HENT:     Head: Normocephalic and atraumatic.     Comments: Periorbital edema noted, no erythema    Right Ear: External ear normal.     Left Ear: External ear normal.  Eyes:     General: No scleral icterus.       Right eye: No discharge.        Left eye: No discharge.     Conjunctiva/sclera: Conjunctivae normal.  Neck:     Trachea: No tracheal deviation.  Cardiovascular:     Rate and Rhythm: Normal rate and regular rhythm.  Pulmonary:     Effort: Pulmonary effort is normal. No respiratory distress.     Breath sounds: Normal breath sounds. No stridor. No wheezing or rales.  Abdominal:     General: Bowel sounds are normal. There is no distension.     Palpations: Abdomen is soft.     Tenderness: There is no abdominal tenderness. There is no guarding or rebound.  Musculoskeletal:        General: Tenderness present. No  deformity.     Cervical back: Neck supple.     Right lower leg: Edema present.     Left lower leg: Edema present.     Comments: Bright red erythema and edema of the bilateral lower extremities, skin is tender to the touch, increased warmth, no lymphangitic streaking, swelling and redness appears to be primarily confined below the knees, no lacerations noted, right fifth toe toenail is missing, no ecchymoses  Skin:    General: Skin is warm and dry.     Findings: No rash.  Neurological:     General: No focal deficit present.     Mental Status: She is alert.     Cranial Nerves: No cranial nerve deficit (no facial droop, extraocular movements intact, no slurred speech).     Sensory: No sensory deficit.     Motor: No abnormal muscle tone or seizure activity.     Coordination: Coordination normal.  Psychiatric:        Mood and Affect: Mood normal.  ED Results / Procedures / Treatments   Labs (all labs ordered are listed, but only abnormal results are displayed) Labs Reviewed - No data to display  EKG EKG Interpretation  Date/Time:  Wednesday July 18 2021 21:28:31 EDT Ventricular Rate:  95 PR Interval:    QRS Duration: 101 QT Interval:  402 QTC Calculation: 437 R Axis:   124 Text Interpretation: Atrial fibrillation Premature ventricular complexes Probable lateral infarct, age indeterminate No significant change since last tracing Confirmed by Dorie Rank [phone]) on 07/18/2021 9:36:21 PM  Radiology No results found.  Procedures Procedures  {Document cardiac monitor, telemetry assessment procedure when appropriate:1}  Medications Ordered in ED Medications - No data to display  ED Course/ Medical Decision Making/ A&P                           Medical Decision Making Amount and/or Complexity of Data Reviewed Labs: ordered. Radiology: ordered.   ***  {Document critical care time when appropriate:1} {Document review of labs and clinical decision tools ie heart score,  Chads2Vasc2 etc:1}  {Document your independent review of radiology images, and any outside records:1} {Document your discussion with family members, caretakers, and with consultants:1} {Document social determinants of health affecting pt's care:1} {Document your decision making why or why not admission, treatments were needed:1} Final Clinical Impression(s) / ED Diagnoses Final diagnoses:  None    Rx / DC Orders ED Discharge Orders     None

## 2021-07-18 NOTE — ED Notes (Signed)
Patient transported to X-ray 

## 2021-07-18 NOTE — ED Triage Notes (Signed)
Pt c/o bilateral leg swelling with redness for a while.

## 2021-07-19 ENCOUNTER — Encounter (HOSPITAL_COMMUNITY): Payer: Self-pay | Admitting: Family Medicine

## 2021-07-19 ENCOUNTER — Inpatient Hospital Stay (HOSPITAL_COMMUNITY): Payer: Medicare Other

## 2021-07-19 DIAGNOSIS — E876 Hypokalemia: Secondary | ICD-10-CM | POA: Diagnosis present

## 2021-07-19 DIAGNOSIS — R600 Localized edema: Secondary | ICD-10-CM

## 2021-07-19 DIAGNOSIS — J9601 Acute respiratory failure with hypoxia: Secondary | ICD-10-CM | POA: Diagnosis not present

## 2021-07-19 DIAGNOSIS — I509 Heart failure, unspecified: Secondary | ICD-10-CM | POA: Diagnosis not present

## 2021-07-19 DIAGNOSIS — I5043 Acute on chronic combined systolic (congestive) and diastolic (congestive) heart failure: Secondary | ICD-10-CM

## 2021-07-19 DIAGNOSIS — G8929 Other chronic pain: Secondary | ICD-10-CM | POA: Diagnosis not present

## 2021-07-19 DIAGNOSIS — I4821 Permanent atrial fibrillation: Secondary | ICD-10-CM

## 2021-07-19 DIAGNOSIS — E039 Hypothyroidism, unspecified: Secondary | ICD-10-CM

## 2021-07-19 DIAGNOSIS — E43 Unspecified severe protein-calorie malnutrition: Secondary | ICD-10-CM | POA: Diagnosis present

## 2021-07-19 LAB — ECHOCARDIOGRAM COMPLETE
AR max vel: 2.35 cm2
AV Area VTI: 2.15 cm2
AV Area mean vel: 1.99 cm2
AV Mean grad: 2 mmHg
AV Peak grad: 4.6 mmHg
Ao pk vel: 1.08 m/s
Area-P 1/2: 3.39 cm2
Calc EF: 26.2 %
Height: 64 in
MV VTI: 1.48 cm2
S' Lateral: 4.3 cm
Single Plane A2C EF: 27.5 %
Single Plane A4C EF: 20.7 %
Weight: 1830.4 oz

## 2021-07-19 LAB — TSH: TSH: 3.947 u[IU]/mL (ref 0.350–4.500)

## 2021-07-19 LAB — COMPREHENSIVE METABOLIC PANEL
ALT: 14 U/L (ref 0–44)
AST: 18 U/L (ref 15–41)
Albumin: 3.5 g/dL (ref 3.5–5.0)
Alkaline Phosphatase: 126 U/L (ref 38–126)
Anion gap: 6 (ref 5–15)
BUN: 18 mg/dL (ref 8–23)
CO2: 28 mmol/L (ref 22–32)
Calcium: 8.6 mg/dL — ABNORMAL LOW (ref 8.9–10.3)
Chloride: 102 mmol/L (ref 98–111)
Creatinine, Ser: 0.6 mg/dL (ref 0.44–1.00)
GFR, Estimated: >60 mL/min (ref >60)
Glucose, Bld: 79 mg/dL (ref 70–99)
Potassium: 3.3 mmol/L — ABNORMAL LOW (ref 3.5–5.1)
Sodium: 136 mmol/L (ref 135–145)
Total Bilirubin: 1.4 mg/dL — ABNORMAL HIGH (ref 0.3–1.2)
Total Protein: 6.2 g/dL — ABNORMAL LOW (ref 6.5–8.1)

## 2021-07-19 LAB — MAGNESIUM: Magnesium: 2 mg/dL (ref 1.7–2.4)

## 2021-07-19 LAB — SARS CORONAVIRUS 2 BY RT PCR: SARS Coronavirus 2 by RT PCR: NEGATIVE

## 2021-07-19 LAB — PROTIME-INR
INR: 1.8 — ABNORMAL HIGH (ref 0.8–1.2)
Prothrombin Time: 20.7 seconds — ABNORMAL HIGH (ref 11.4–15.2)

## 2021-07-19 MED ORDER — POTASSIUM CHLORIDE 20 MEQ PO PACK: 40.0000 meq | PACK | Freq: Once | ORAL | Status: DC

## 2021-07-19 MED ORDER — METHOCARBAMOL 500 MG PO TABS: 750.0000 mg | ORAL_TABLET | Freq: Four times a day (QID) | ORAL | Status: DC

## 2021-07-19 MED ORDER — WARFARIN - PHARMACIST DOSING INPATIENT: Freq: Every day | Status: DC

## 2021-07-19 MED ORDER — MORPHINE SULFATE (PF) 2 MG/ML IV SOLN
2.0000 mg | INTRAVENOUS | Status: DC | PRN
  Administered 2021-07-19 (×2): 2 mg via INTRAVENOUS
  Filled 2021-07-19 (×2): qty 1

## 2021-07-19 MED ORDER — METHOCARBAMOL 500 MG PO TABS
750.0000 mg | ORAL_TABLET | Freq: Four times a day (QID) | ORAL | Status: DC | PRN
  Administered 2021-07-19 – 2021-07-20 (×4): 750 mg via ORAL
  Filled 2021-07-19 (×5): qty 2

## 2021-07-19 MED ORDER — ACETAMINOPHEN 325 MG PO TABS: 650.0000 mg | ORAL_TABLET | Freq: Four times a day (QID) | ORAL | Status: DC | PRN

## 2021-07-19 MED ORDER — OXYCODONE-ACETAMINOPHEN 7.5-325 MG PO TABS
1.0000 | ORAL_TABLET | ORAL | Status: DC | PRN
  Administered 2021-07-20 – 2021-07-24 (×20): 1 via ORAL
  Filled 2021-07-19 (×20): qty 1

## 2021-07-19 MED ORDER — ATENOLOL 25 MG PO TABS
25.0000 mg | ORAL_TABLET | Freq: Every day | ORAL | Status: DC
  Filled 2021-07-19 (×3): qty 1

## 2021-07-19 MED ORDER — FUROSEMIDE 10 MG/ML IJ SOLN
40.0000 mg | Freq: Two times a day (BID) | INTRAMUSCULAR | Status: DC
  Administered 2021-07-19: 40 mg via INTRAVENOUS
  Filled 2021-07-19: qty 4

## 2021-07-19 MED ORDER — ONDANSETRON HCL 4 MG/2ML IJ SOLN
4.0000 mg | Freq: Four times a day (QID) | INTRAMUSCULAR | Status: DC | PRN
  Administered 2021-07-21: 4 mg via INTRAVENOUS
  Filled 2021-07-19: qty 2

## 2021-07-19 MED ORDER — OXYCODONE HCL 5 MG PO TABS: 5.0000 mg | ORAL_TABLET | ORAL | Status: DC | PRN

## 2021-07-19 MED ORDER — PREDNISONE 10 MG PO TABS
5.0000 mg | ORAL_TABLET | Freq: Every morning | ORAL | Status: DC
  Administered 2021-07-19 – 2021-07-24 (×6): 5 mg via ORAL
  Filled 2021-07-19 (×6): qty 1

## 2021-07-19 MED ORDER — POTASSIUM CHLORIDE 20 MEQ PO PACK
40.0000 meq | PACK | Freq: Every day | ORAL | Status: DC
  Administered 2021-07-19 – 2021-07-20 (×2): 40 meq via ORAL
  Filled 2021-07-19 (×2): qty 2

## 2021-07-19 MED ORDER — FUROSEMIDE 10 MG/ML IJ SOLN
30.0000 mg | Freq: Two times a day (BID) | INTRAMUSCULAR | Status: DC
  Administered 2021-07-19 – 2021-07-23 (×9): 30 mg via INTRAVENOUS
  Filled 2021-07-19 (×10): qty 4

## 2021-07-19 MED ORDER — ONDANSETRON HCL 4 MG PO TABS
4.0000 mg | ORAL_TABLET | Freq: Four times a day (QID) | ORAL | Status: DC | PRN
  Administered 2021-07-22 – 2021-07-23 (×2): 4 mg via ORAL
  Filled 2021-07-19 (×2): qty 1

## 2021-07-19 MED ORDER — MORPHINE SULFATE (PF) 2 MG/ML IV SOLN: 1.0000 mg | INTRAVENOUS | Status: DC | PRN

## 2021-07-19 MED ORDER — ORAL CARE MOUTH RINSE: 15.0000 mL | OROMUCOSAL | Status: DC | PRN

## 2021-07-19 MED ORDER — HYDROMORPHONE HCL 1 MG/ML IJ SOLN
0.5000 mg | INTRAMUSCULAR | Status: DC | PRN
  Administered 2021-07-19 – 2021-07-21 (×9): 0.5 mg via INTRAVENOUS
  Filled 2021-07-19 (×9): qty 0.5

## 2021-07-19 MED ORDER — LEVOTHYROXINE SODIUM 100 MCG PO TABS
100.0000 ug | ORAL_TABLET | Freq: Every morning | ORAL | Status: DC
  Administered 2021-07-19 – 2021-07-24 (×6): 100 ug via ORAL
  Filled 2021-07-19 (×6): qty 1

## 2021-07-19 MED ORDER — WARFARIN SODIUM 2 MG PO TABS
3.0000 mg | ORAL_TABLET | Freq: Once | ORAL | Status: AC
  Administered 2021-07-19: 3 mg via ORAL
  Filled 2021-07-19: qty 1

## 2021-07-19 MED ORDER — WARFARIN SODIUM 2 MG PO TABS: 3.0000 mg | ORAL_TABLET | Freq: Every day | ORAL | Status: DC

## 2021-07-19 MED ORDER — ACETAMINOPHEN 650 MG RE SUPP: 650.0000 mg | Freq: Four times a day (QID) | RECTAL | Status: DC | PRN

## 2021-07-19 MED ORDER — OXYCODONE-ACETAMINOPHEN 7.5-325 MG PO TABS: 1.0000 | ORAL_TABLET | ORAL | Status: DC | PRN

## 2021-07-19 NOTE — Assessment & Plan Note (Addendum)
Pt refusing to take beta-blocker Continue warfarin per pharm D Follow INR Goal between 2-3 Continue to monitor

## 2021-07-19 NOTE — ED Notes (Signed)
Provider at bedside

## 2021-07-19 NOTE — Assessment & Plan Note (Addendum)
-   Continue Percocet and Robaxin - pain has been difficult to control, I have requested palliative consult as patient seems to want to focus more on pain relief and comfort versus continuing full scope acute care, she may elect for full comfort measures

## 2021-07-19 NOTE — Progress Notes (Signed)
ASSUMPTION OF CARE NOTE   07/19/2021 5:27 PM  Angela Wong was seen and examined.  The H&P by the admitting provider, orders, imaging was reviewed.  Please see new orders.  Will continue to follow.   Vitals:   07/19/21 1357 07/19/21 1400  BP: 132/75 132/76  Pulse: 64 66  Resp: 18 18  Temp: 98.9 F (37.2 C) 98.6 F (37 C)  SpO2: 100% 100%    Results for orders placed or performed during the hospital encounter of 07/18/21  SARS Coronavirus 2 by RT PCR (hospital order, performed in [place] hospital lab) *cepheid single result test* Anterior Nasal Swab   Specimen: Anterior Nasal Swab  Result Value Ref Range   SARS Coronavirus 2 by RT PCR NEGATIVE NEGATIVE  Basic metabolic panel  Result Value Ref Range   Sodium 137 135 - 145 mmol/L   Potassium 3.4 (L) 3.5 - 5.1 mmol/L   Chloride 104 98 - 111 mmol/L   CO2 26 22 - 32 mmol/L   Glucose, Bld 85 70 - 99 mg/dL   BUN 19 8 - 23 mg/dL   Creatinine, Ser 0.65 0.44 - 1.00 mg/dL   Calcium 8.6 (L) 8.9 - 10.3 mg/dL   GFR, Estimated >60 >60 mL/min   Anion gap 7 5 - 15  Magnesium  Result Value Ref Range   Magnesium 2.0 1.7 - 2.4 mg/dL  Brain natriuretic peptide (order ONLY if patient c/o SOB)  Result Value Ref Range   B Natriuretic Peptide 1,463.0 (H) 0.0 - 100.0 pg/mL  CBC  Result Value Ref Range   WBC 5.4 4.0 - 10.5 K/uL   RBC 4.30 3.87 - 5.11 MIL/uL   Hemoglobin 13.8 12.0 - 15.0 g/dL   HCT 42.5 36.0 - 46.0 %   MCV 98.8 80.0 - 100.0 fL   MCH 32.1 26.0 - 34.0 pg   MCHC 32.5 30.0 - 36.0 g/dL   RDW 14.0 11.5 - 15.5 %   Platelets 107 (L) 150 - 400 K/uL   nRBC 0.0 0.0 - 0.2 %  Protime-INR  Result Value Ref Range   Prothrombin Time 21.9 (H) 11.4 - 15.2 seconds   INR 1.9 (H) 0.8 - 1.2  Protime-INR  Result Value Ref Range   Prothrombin Time 20.7 (H) 11.4 - 15.2 seconds   INR 1.8 (H) 0.8 - 1.2  Comprehensive metabolic panel  Result Value Ref Range   Sodium 136 135 - 145 mmol/L   Potassium 3.3 (L) 3.5 - 5.1 mmol/L   Chloride  102 98 - 111 mmol/L   CO2 28 22 - 32 mmol/L   Glucose, Bld 79 70 - 99 mg/dL   BUN 18 8 - 23 mg/dL   Creatinine, Ser 0.60 0.44 - 1.00 mg/dL   Calcium 8.6 (L) 8.9 - 10.3 mg/dL   Total Protein 6.2 (L) 6.5 - 8.1 g/dL   Albumin 3.5 3.5 - 5.0 g/dL   AST 18 15 - 41 U/L   ALT 14 0 - 44 U/L   Alkaline Phosphatase 126 38 - 126 U/L   Total Bilirubin 1.4 (H) 0.3 - 1.2 mg/dL   GFR, Estimated >60 >60 mL/min   Anion gap 6 5 - 15  Magnesium  Result Value Ref Range   Magnesium 2.0 1.7 - 2.4 mg/dL  TSH  Result Value Ref Range   TSH 3.947 0.350 - 4.500 uIU/mL  ECHOCARDIOGRAM COMPLETE  Result Value Ref Range   Weight 1,830.4 oz   Height 64 in   BP 135/72  mmHg   Single Plane A2C EF 27.5 %   Single Plane A4C EF 20.7 %   Calc EF 26.2 %   AR max vel 2.35 cm2   AV Area VTI 2.15 cm2   AV Mean grad 2.0 mmHg   AV Peak grad 4.6 mmHg   Ao pk vel 1.08 m/s   AV Area mean vel 1.99 cm2   MV VTI 1.48 cm2   Area-P 1/2 3.39 cm2   S' Lateral 4.30 cm     Murvin Natal, MD [facility]   07/18/2021  9:18 PM How to contact the [facility] Attending or Consulting provider [place] or covering provider during after hours [place], for this patient?  Check the care team in [facility] and look for a) attending/consulting [facility] provider listed and b) the [facility] team listed Log into [email or web address] and use [place]'s universal password to access. If you do not have the password, please contact the hospital operator. Locate the [facility] provider you are looking for under [facility] and page to a number that you can be directly reached. If you still have difficulty reaching the provider, please page the [facility] (Director on Call) for the Hospitalists listed on amion for assistance.

## 2021-07-19 NOTE — Progress Notes (Signed)
Initial Nutrition Assessment  DOCUMENTATION CODES:   Severe malnutrition in context of chronic illness  INTERVENTION:  Handout provided   ProSource 30 ml TID (each 30 ml provides 100 kcal, 15 gr protein)   Snacks between meals as desired  NUTRITION DIAGNOSIS:   Severe Malnutrition related to chronic illness as evidenced by energy intake < 75% for > or equal to 1 month, per patient/family report, severe fat depletion, severe muscle depletion, percent weight loss.   GOAL:  Patient will meet greater than or equal to 90% of their needs  MONITOR:  PO intake, Labs, Weight trends  REASON FOR ASSESSMENT:   Consult Assessment of nutrition requirement/status, Diet education  ASSESSMENT: Patient is a 81 yo female with hx of COPD, fibromyalgia, cerebral vasculitis,ovarian cancer, Sjogren's syndrome, Raynaud's dz, and vitamin D deficiency. Presents with leg swelling.   Patient is a retired Marine scientist. She is tearful during out visit and complaining of pain. Her son is bedside.  Patient is eating Heart Healthy diet. Education provided. Patient follows a low sodium diet at home. She has a number of allergies and limits foods choices. Go to foods are carrots, peas, rotisserie chicken, eggs, rye toast. She doesn't drink ONS due to vitamin content but will drink Almond milk. Emphasized the importance of including protein each meal.  Usual weight reported 110 lb (50 kg). Currently 51.9 kg (114 lb). Weight difference may be related to her BLE edema. History of weight loss since last August-66 kg reflects a loss of 21% the past 11 months which is significant.   Medications: lasix, klor-con, prednisone.      Latest Ref Rng & Units 07/19/2021    5:19 AM 07/18/2021   10:52 PM 06/06/2021    5:06 PM  BMP  Glucose 70 - 99 mg/dL 79  85  89   BUN 8 - 23 mg/dL '18  19  21   '$ Creatinine 0.44 - 1.00 mg/dL 0.60  0.65  0.72   Sodium 135 - 145 mmol/L 136  137  133   Potassium 3.5 - 5.1 mmol/L 3.3  3.4  3.8    Chloride 98 - 111 mmol/L 102  104  102   CO2 22 - 32 mmol/L '28  26  22   '$ Calcium 8.9 - 10.3 mg/dL 8.6  8.6  8.6       NUTRITION - FOCUSED PHYSICAL EXAM: Nutrition-Focused physical exam completed. Findings are moderate orbital, severe thoracic, buccal fat depletion, mild temporal, severe clavicle, muscle depletion, and BLE pitting edema.     Diet Order:   Diet Order             Diet Heart Room service appropriate? Yes; Fluid consistency: Thin  Diet effective now                   EDUCATION NEEDS:  Education needs have been addressed (Low sodium)  Skin:  Skin Assessment: Reviewed RN Assessment  Last BM:  7/5  Height:   Ht Readings from Last 1 Encounters:  07/19/21 '5\' 4"'$  (1.626 m)    Weight:   Wt Readings from Last 1 Encounters:  07/19/21 51.9 kg    Ideal Body Weight:   55 kg  BMI:  Body mass index is 19.64 kg/m.  Estimated Nutritional Needs:   Kcal:  1500-1600  Protein:  65-70 kg  Fluid:  1600 ml daily   Colman Cater MS,RD,CSG,LDN Contact: AMION 65-70 gr

## 2021-07-19 NOTE — Plan of Care (Signed)
Pt is alert and oriented x 4. Total care due to weakness and pain. Pt is requiring Morphine approx every 2 hours. Pt has oxycodone for pain but pt reported she is unable to take pills without applesauce but due to additives in certain applesauces and her allergies she doesn't want to try at this time.  Problem: Pain Managment: Goal: General experience of comfort will improve Outcome: Not Progressing   Problem: Education: Goal: Knowledge of General Education information will improve Description: Including pain rating scale, medication(s)/side effects and non-pharmacologic comfort measures Outcome: Progressing   Problem: Health Behavior/Discharge Planning: Goal: Ability to manage health-related needs will improve Outcome: Progressing   Problem: Clinical Measurements: Goal: Ability to maintain clinical measurements within normal limits will improve Outcome: Progressing Goal: Will remain free from infection Outcome: Progressing Goal: Diagnostic test results will improve Outcome: Progressing Goal: Respiratory complications will improve Outcome: Progressing Goal: Cardiovascular complication will be avoided Outcome: Progressing   Problem: Activity: Goal: Risk for activity intolerance will decrease Outcome: Progressing

## 2021-07-19 NOTE — Progress Notes (Signed)
ANTICOAGULATION CONSULT NOTE - Initial Up Consult   Pharmacy Consult for warfarin dosing  Indication: atrial fibrillation   Allergies  Allergen Reactions   Cephalosporins Hives and Shortness Of Breath   Ciprofloxacin Hives and Shortness Of Breath   Diltiazem Shortness Of Breath    Swollen throat    Doxycycline Hives and Shortness Of Breath   Horse-Derived Products Anaphylaxis   Ketek [Telithromycin] Palpitations    Chest discomfort   Nitrofuran Derivatives Anaphylaxis and Hives    blisters   Nitrous Oxide Nausea And Vomiting    Severe due to Sjogrens (Auto-Immune Disease)   Other Anaphylaxis    ALLERGY TO HORSE SERUM   Penicillins Hives and Shortness Of Breath        Pentazocine Other (See Comments)    Other reaction(s): Mental Status Changes (intolerance) Altered Mental Status  Altered Mental Status  Altered Mental Status    Sulfa Antibiotics Hives and Shortness Of Breath   Trovan [Alatrofloxacin] Palpitations, Other (See Comments) and Anaphylaxis    Chest pain, dizziness, irregular pulse   Vitamin B12     Confusion and shaking    Zinc Gelatin [Zinc] Anaphylaxis   Amlodipine Swelling   Calcium Channel Blockers     Respiratory distress   Carvedilol Other (See Comments)    Dizziness, "joint pain, depression"   Clindamycin/Lincomycin     CP, lock jaw   Codeine Nausea Only   Cymbalta [Duloxetine Hcl] Swelling   Diovan [Valsartan] Swelling   Lisinopril Swelling   Omeprazole     Generic with additives    Sertraline Other (See Comments)    confusion   Tramadol Nausea Only   Loteprednol Etabonate Rash      Patient Measurements: Last Weight  Most recent update: 07/19/2021  1:32 AM    Weight  51.9 kg (114 lb 6.4 oz)            Body mass index is 19.64 kg/m. Angela Wong               Temp: 98.2 F (36.8 C) (07/06 0510) Temp Source: Oral (07/06 0510) BP: 141/73 (07/06 0510) Pulse Rate: 68 (07/06 0510)  Labs: Recent Labs    07/18/21 2252  07/19/21 0519  HGB 13.8  --   HCT 42.5  --   PLT 107*  --   LABPROT 21.9* 20.7*  INR 1.9* 1.8*  CREATININE 0.65 0.60    Estimated Creatinine Clearance: 45.2 mL/min (by C-G formula based on SCr of 0.6 mg/dL).     Medications:  Medications Prior to Admission  Medication Sig Dispense Refill Last Dose   albuterol (VENTOLIN HFA) 108 (90 Base) MCG/ACT inhaler INHALE 2 PUFFS BY MOUTH EVERY 6 HOURS AS NEEDED FOR WHEEZING (Patient not taking: Reported on 06/06/2021) 9 g 0    atenolol (TENORMIN) 25 MG tablet Take 0.5 tablets (12.5 mg total) by mouth 2 (two) times daily. (Patient taking differently: Take 25 mg by mouth daily.) 60 tablet 5    azithromycin (ZITHROMAX) 250 MG tablet Take 500 mg once, then 250 mg for four days (Patient not taking: Reported on 06/06/2021) 6 tablet 0    furosemide (LASIX) 40 MG tablet Take 1 tablet (40 mg total) by mouth daily. (Patient taking differently: Take 20-40 mg by mouth daily.) 90 tablet 1    hydrocortisone-pramoxine (ANALPRAM-HC) 2.5-1 % rectal cream PLACE 1 APPLICATION RECTALLY SEE ADMINISTRATION INSTRUCTIONS, AFTER EACH BATHROOM USE 60 g 0    levothyroxine (EUTHYROX) 50 MCG tablet Take 2 tablets (100  mcg total) by mouth every morning. 180 tablet 3    methocarbamol (ROBAXIN) 750 MG tablet Take 1 tablet (750 mg total) by mouth 4 (four) times daily. 180 tablet 2    nitroGLYCERIN (NITROSTAT) 0.3 MG SL tablet Place under the tongue.      ondansetron (ZOFRAN) 4 MG tablet Take 1 tablet (4 mg total) by mouth every 8 (eight) hours as needed for nausea or vomiting. 60 tablet 0    oxyCODONE-acetaminophen (PERCOCET) 7.5-325 MG tablet Take 1 tablet by mouth every 4 (four) hours as needed for severe pain. 180 tablet 0    oxyCODONE-acetaminophen (PERCOCET) 7.5-325 MG tablet Take 1 tablet by mouth every 4 (four) hours as needed for severe pain. (Patient not taking: Reported on 06/06/2021) 180 tablet 0    oxyCODONE-acetaminophen (PERCOCET) 7.5-325 MG tablet Take 1 tablet by mouth  every 4 (four) hours as needed for severe pain. (Patient not taking: Reported on 06/06/2021) 180 tablet 0    predniSONE (DELTASONE) 5 MG tablet Take 1 tablet (5 mg total) by mouth every morning. 90 tablet 0    RESTASIS 0.05 % ophthalmic emulsion INSTILL 1 DROP INTO EACH EYE TWICE DAILY (Patient not taking: Reported on 06/06/2021) 120 each 0    warfarin (COUMADIN) 1 MG tablet TAKE 4 TABLETS BY MOUTH ON MONDAY, WEDNESDAY AND FRIDAY. TAKE 2 TABLETS ON TUESDAY, THURSDAY, SATURDAY AND SUNDAY (Patient taking differently: TAKE 3 TABLETS BY MOUTH ON MONDAY, AND FRIDAY. TAKE 2 TABLETS ON TUESDAY,Wednesday, THURSDAY, SATURDAY AND SUNDAY) 90 tablet 0    Scheduled:   atenolol  25 mg Oral Daily   furosemide  40 mg Intravenous BID   levothyroxine  100 mcg Oral q morning   potassium chloride  40 mEq Oral Daily   predniSONE  5 mg Oral q morning   warfarin  3 mg Oral q1600   Infusions:  PRN: acetaminophen **OR** acetaminophen, methocarbamol, morphine injection, ondansetron **OR** ondansetron (ZOFRAN) IV, mouth rinse, oxyCODONE, oxyCODONE-acetaminophen Anti-infectives (From admission, onward)    Start     Dose/Rate Route Frequency Ordered Stop   07/18/21 2315  vancomycin (VANCOCIN) IVPB 1000 mg/200 mL premix        1,000 mg 200 mL/hr over 60 Minutes Intravenous  Once 07/18/21 2309 07/19/21 0039       Goal of Therapy:  INR 2-3 Monitor platelets by anticoagulation protocol: Yes    Prior to Admission Warfarin Dosing:  Angela Wong takes '3mg'$  of warfarin Monday and Friday, '2mg'$  Angela Wong      Admit INR was 1.9 Lab Results  Component Value Date   INR 1.8 (H) 07/19/2021   INR 1.9 (H) 07/18/2021   INR 2.1 (H) 06/06/2021    Assessment: Angela Wong a 81 y.o. female requires anticoagulation with warfarin for the indication of  atrial fibrillation. Warfarin will be initiated inpatient following pharmacy protocol per pharmacy consult. Patient most recent blood work is as follows:    Latest Ref Rng & Units  07/18/2021   10:52 PM 06/06/2021    5:06 PM 05/31/2021    2:26 AM  CBC  WBC 4.0 - 10.5 K/uL 5.4  10.4  8.9   Hemoglobin 12.0 - 15.0 g/dL 13.8  15.6  16.3   Hematocrit 36.0 - 46.0 % 42.5  47.3  49.9   Platelets 150 - 400 K/uL 107  129  119      Plan: Warfarin '3mg'$  po x 1  Monitor CBC MWF with am labs   Monitor INR daily Monitor for signs  and symptoms of bleeding   Thomasenia Sales, PharmD, MBA, BCGP Clinical Pharmacist

## 2021-07-19 NOTE — Assessment & Plan Note (Addendum)
-   With erythema of both lower extremities -Likely bilateral venous stasis dermatitis -No leukocytosis, afebrile -Patient was started on treatment for cellulitis with vancomycin -Holding off on further antibiotics at this time bilateral same-day onset cellulitis is unlikely -Continue to monitor -improving with IV lasix diuresis

## 2021-07-19 NOTE — Assessment & Plan Note (Signed)
-   TSH 3.94 7 -Continue Synthroid

## 2021-07-19 NOTE — Assessment & Plan Note (Addendum)
-   Most likely secondary to acute heart failure exacerbation -wean oxygen as tolerated  -Chest x-ray shows stable small bilateral pleural effusions and bibasilar atelectasis -Continue treatment for CHF -Wean off O2 as tolerated

## 2021-07-19 NOTE — TOC Initial Note (Signed)
Transition of Care [facility]) - Initial/Assessment Note    Patient Details  Name: Angela Wong MRN: [id number] Date of Birth: 1940-02-01  Transition of Care [facility]) CM/SW Contact:    Iona Beard, [place] Phone Number: 07/19/2021, 2:13 PM  Clinical Narrative:                 Pt is high risk for readmission. CSW spoke with pt and son in room to complete assessment. Pt lives in her home and has caregivers round the clock. Pt has difficulty getting in and out of the care but has transportation when needed. Pt has had HH in the past but would like to use a different company that previously. Per chart review pt had used [facility] in the past. Pt has a walker and BSC that she uses in the home. Pt does not wear home O2 at home.   CSW completed CHF consult with pt. Pt does not weigh daily but states she stays at the same weight. Pt follows a heart healthy diet. Pt states that she has a hard time taking all her medications as prescribed as she is allergic to many of the fillers in the medications. TOC to follow for needs.   Expected Discharge Plan: [place] Barriers to Discharge: Continued Medical Work up   Patient Goals and CMS Choice Patient states their goals for this hospitalization and ongoing recovery are:: reutrn home CMS Medicare.gov Compare Post Acute Care list provided to:: Patient Choice offered to / list presented to : Patient, Adult Children  Expected Discharge Plan and Services Expected Discharge Plan: [place] In-house Referral: Clinical Social Work   Post Acute Care Choice: [place] arrangements for the past 2 months: [place]                                      Prior Living Arrangements/Services Living arrangements for the past 2 months: Single Family Home Lives with:: Self Patient language and need for interpreter reviewed:: Yes Do you feel safe going back to the place where you live?: Yes      Need for  Family Participation in Patient Care: Yes (Comment) Care giver support system in place?: Yes (comment) Current home services: DME Criminal Activity/Legal Involvement Pertinent to Current Situation/Hospitalization: No - Comment as needed  Activities of Daily Living Home Assistive Devices/Equipment: Other (Comment), Walker (specify type), Shower chair with back (lift chair) ADL Screening (condition at time of admission) Patient's cognitive ability adequate to safely complete daily activities?: Yes Is the patient deaf or have difficulty hearing?: No Does the patient have difficulty seeing, even when wearing glasses/contacts?: No Does the patient have difficulty concentrating, remembering, or making decisions?: No Patient able to express need for assistance with ADLs?: Yes Does the patient have difficulty dressing or bathing?: Yes Independently performs ADLs?: No Communication: Independent Dressing (OT): Needs assistance Is this a change from baseline?: Pre-admission baseline Grooming: Needs assistance Is this a change from baseline?: Pre-admission baseline Feeding: Independent Bathing: Needs assistance Is this a change from baseline?: Pre-admission baseline Toileting: Needs assistance Is this a change from baseline?: Pre-admission baseline In/Out Bed: Needs assistance Is this a change from baseline?: Pre-admission baseline Walks in Home: Needs assistance Is this a change from baseline?: Pre-admission baseline Does the patient have difficulty walking or climbing stairs?: Yes Weakness of Legs: Both Weakness of Arms/Hands: Both  Permission Sought/Granted                  Emotional Assessment Appearance:: Appears stated age Attitude/Demeanor/Rapport: Engaged Affect (typically observed): Accepting Orientation: : Oriented to Self, Oriented to Place, Oriented to  Time, Oriented to Situation Alcohol / Substance Use: Not Applicable Psych Involvement: No (comment)  Admission  diagnosis:  Peripheral edema [R60.9] CHF exacerbation (HCC) [I50.9] Cellulitis of lower extremity, unspecified laterality [W96.045] Acute congestive heart failure, unspecified heart failure type [facility]) [I50.9] Patient Active Problem List   Diagnosis Date Noted   Acute respiratory failure with hypoxia ([place]) 07/19/2021   Hypokalemia 07/19/2021   CHF exacerbation ([place]) 07/18/2021   Chronic pain    Calculus of gallbladder with acute cholecystitis and obstruction    Choledocholithiasis 10/17/2020   Elevated LFTs    Dysphagia    Multiple allergies 09/29/2020   Pacemaker 07/22/2019   Drug intolerance 07/22/2019   Edema of both lower extremities 07/22/2019   Subtherapeutic international normalized ratio (INR) 07/05/2019   CAD (coronary artery disease) 07/05/2019   Non-STEMI (non-ST elevated myocardial infarction) ([place]) 07/01/2019   Chronic constipation 06/29/2019   Hypertensive heart disease with heart failure ([place]) 06/29/2019   Decompensated heart failure ([place]) 06/22/2019   Pressure injury of skin 06/22/2019   RA (rheumatoid arthritis) ([place]) 05/04/2019   Anticoagulation goal of INR 2 to 3 12/18/2017   Depression, major, single episode, moderate ([place]) 09/12/2017   Chest pain 08/25/2017   Generalized weakness 08/25/2017   Seasonal and perennial allergic rhinitis 12/31/2016   Allergic rhinitis with a nonallergic component 10/01/2016   Itching 10/01/2016   Skeeter syndrome 10/01/2016   Steroid-induced osteopenia 07/11/2016   Hyperparathyroidism ([place]) 01/16/2016   Iron deficiency anemia 12/18/2015   Insomnia 12/18/2015   Hypothyroidism 12/18/2015   Rotator cuff tear 03/23/2014   Osteoarthritis    Moderate persistent asthma without complication    Ovarian cancer ([place])    Fibromyalgia    RLS (restless legs syndrome)    Cerebral vasculitis    Sjogren's syndrome ([place])    Raynaud's disease    MVP (mitral valve prolapse)    IBS (irritable bowel syndrome)    Congestive dilated  cardiomyopathy ([place]) 02/09/2014   Dyspnea 12/13/2013   Dyslipidemia 11/26/2013   Malaise and fatigue 11/26/2013   Allergy to multiple antibiotics 11/02/2013   History of ovarian cancer 10/01/2013   GERD (gastroesophageal reflux disease) 10/01/2013   Hypertension 10/01/2013   Atrial fibrillation, permanent ([place]) 07/27/2013   PCP:  [name], FNP Pharmacy:   [facility] [address] [place] [address] Phone: [phone] Fax: [phone]  [id number] [place]: [phone]: [address]Phone: [phone]: [phone] [id number] [place] [address] [place] [id number]: [phone]: [phone]   Social Determinants of Health (SDOH) Interventions    Readmission Risk Interventions    07/19/2021    2:11 PM 10/25/2020    3:19 PM  Readmission Risk Prevention Plan  Transportation Screening Complete Complete  HRI or Home Care Consult Complete Complete  Social Work Consult for Re[place]ves[place]elanning/Counseling Complete Complete  Palliative Care Screening Not Applicable Not Applicable  Medication Review (RPress photographer  Complete Complete

## 2021-07-19 NOTE — Assessment & Plan Note (Addendum)
-   With edema in bilateral lower extremities, venous stasis dermatitis, periorbital edema, and new oxygen requirement 2 L nasal cannula -Known dilated cardiomyopathy -BNP up to 1400 from 600s -40 mg IV Lasix given in the ED -Continue 30 mg IV Lasix twice daily - cardiology seen and recommending change to oral lasix 40 mg daily with KCL 10 meq daily. -Unfortunately Echo 7/6 with findings of further reduced EF down to 20-25% which likely explains fluid gain and increased fatigue and somnolence  -pt refusing to take beta-blocker -Continue to monitor -Pt could not tolerate ReDS vest reading so it was abandoned -palliative care consultation requested    Intake/Output Summary (Last 24 hours) at 07/24/2021 1118 Last data filed at 07/24/2021 0500 Gross per 24 hour  Intake 240 ml  Output 700 ml  Net -460 ml   Filed Weights   07/22/21 0500 07/23/21 0500 07/24/21 0500  Weight: 47.9 kg 48.2 kg 48.5 kg

## 2021-07-19 NOTE — Progress Notes (Signed)
*  PRELIMINARY RESULTS* Echocardiogram 2D Echocardiogram has been performed.  Angela Wong 07/19/2021, 11:16 AM

## 2021-07-19 NOTE — H&P (Signed)
History and Physical    Patient: Angela Wong JAS:[id number] DOB: [birth year] DOA: 07/18/2021 DOS: the patient was seen and examined on 07/19/2021 PCP: [name], FNP  Patient coming from: Home  Chief Complaint:  Chief Complaint  Patient presents with   Leg Swelling   HPI: Angela Wong is a 81 y.o. female with medical history significant of atrial fibrillation, cerebral vasculitis, congestive dilated cardiomyopathy, COPD, fibromyalgia, GERD, hyperparathyroidism, hypothyroidism, Sjogren's, situational depression, sick sinus syndrome, Raynaud's, vitamin D deficiency, 27 medication allergies, and more presents to ED with a chief complaint of difficulty with ambulation.  Patient reports that she had an appointment with her lawyer so she did not take her pain medicine because she wanted to be of clear mind.  She also did not take her Lasix because she wanted to be able to focus without having to get up to go to the bathroom.  She then had pain and swelling in her legs and came into the ED.  Patient reports she ambulates with a walker.  She was attempting to ambulate today when her son and caregiver nearly dropped her.  Her son stepped on her toes, and her left leg was twisted.  Patient reports her legs have been swollen ever since she was in the hospital last time.  She reports she was given IV hydration and her legs "blew up like balloons."  This started getting red a couple of days ago.  She was given a Z-Pak, but she only took 1 dose and then she stopped because she reports having convulsions due to an allergy to the Z-Pak.  Patient is quite anxious about allergies reporting that she has had anaphylaxis secondary to tetanus shot as a child.  She reports her son was killed her, so she takes her allergies very seriously.  Patient reports that she has had subjective fever and chills at home for the last 2 weeks.  No erythema in her legs is been worse over the last 2 weeks.  Patient reports that her  normal pain regimen regimen has not been improving her legs.  She does take 20 mg of Lasix at home, last dose was the fourth.  Patient reports associated dyspnea that she has had intermittently for months.  She reports chest pain that she has had intermittently for months-years.  She does not wear oxygen at baseline.  She reports her dyspnea and chest pain are both more today than normal.  She has had some nausea but denies vomiting.  Patient is overall poor historian because she is convinced that she has a vasculitis and only wants to answer questions that would be consistent with vasculitis.  We discussed CODE STATUS.  I am not entirely sure patient understands and on asking she keeps reporting that she was talking to her cardiologist about whether or not she should have a defibrillator placed for her [place].  Patient does have a pacemaker in her right chest.  I explained that I am asking about CPR and life support, but she keeps talking about the AICD.  For now full code. Review of Systems: As mentioned in the history of present illness. All other systems reviewed and are negative. Past Medical History:  Diagnosis Date   A-fib [facility])    Asthma    Cerebral vasculitis    Congestive dilated cardiomyopathy (HCC)    COPD (chronic obstructive pulmonary disease) (HCC)    Fibromyalgia    Gastric polyp    GERD (gastroesophageal reflux disease)  Hiatal hernia    Hyperparathyroidism (HCC)    IBS (irritable bowel syndrome)    MVP (mitral valve prolapse)    Osteoarthritis    Ovarian cancer (HCC)    lymph node removal with hysterectomy   Raynaud's disease    RLS (restless legs syndrome)    Sick sinus syndrome (HCC)    Situational depression    Sjogren's syndrome (HCC)    Vasculitis ([place])    Vitamin D deficiency    Past Surgical History:  Procedure Laterality Date   ABDOMINAL HYSTERECTOMY  1982   with right oophorectomy   APPENDECTOMY     BREAST BIOPSY Right    x 2   BREAST SURGERY      Biopsy   CARPAL TUNNEL RELEASE Right 1980   CARPAL TUNNEL RELEASE Left 2010   x 2   CATARACT EXTRACTION Bilateral    CESAREAN SECTION     x 3   CHOLECYSTECTOMY N/A 10/23/2020   Procedure: LAPAROSCOPIC CHOLECYSTECTOMY;  Surgeon: [name] Signs, MD;  Location: [facility];  Service: General;  Laterality: N/A;   ENDOSCOPIC RETROGRADE CHOLANGIOPANCREATOGRAPHY (ERCP) WITH PROPOFOL N/A 10/19/2020   Procedure: ENDOSCOPIC RETROGRADE CHOLANGIOPANCREATOGRAPHY (ERCP) WITH PROPOFOL;  Surgeon: Rogene Houston, MD;  Location: [facility];  Service: Endoscopy;  Laterality: N/A;   ESOPHAGEAL DILATION N/A 10/19/2020   Procedure: ESOPHAGEAL DILATION;  Surgeon: Rogene Houston, MD;  Location: [facility];  Service: Endoscopy;  Laterality: N/A;   ESOPHAGOGASTRODUODENOSCOPY (EGD) WITH PROPOFOL N/A 10/19/2020   Procedure: ESOPHAGOGASTRODUODENOSCOPY (EGD) WITH PROPOFOL;  Surgeon: Rogene Houston, MD;  Location: [facility];  Service: Endoscopy;  Laterality: N/A;   KNEE SURGERY Left 2005   LEFT HEART CATH AND CORONARY ANGIOGRAPHY N/A 07/02/2019   Procedure: LEFT HEART CATH AND CORONARY ANGIOGRAPHY;  Surgeon: Jettie Booze, MD;  Location: [place] [facility];  Service: Cardiovascular;  Laterality: N/A;   OOPHORECTOMY Left [place]  09/15/2014   REFRACTIVE SURGERY Bilateral 2014   SPHINCTEROTOMY N/A 10/19/2020   Procedure: SPHINCTEROTOMY;  Surgeon: Rogene Houston, MD;  Location: [facility];  Service: Endoscopy;  Laterality: N/A;   STONE EXTRACTION WITH BASKET N/A 10/19/2020   Procedure: STONE EXTRACTION WITH BASKET;  Surgeon: Rogene Houston, MD;  Location: [facility];  Service: Endoscopy;  Laterality: N/A;   TUBAL LIGATION     Social History:  reports that she has never smoked. She has never used smokeless tobacco. She reports that she does not drink alcohol and does not use drugs.  Allergies  Allergen Reactions   Cephalosporins Hives and Shortness Of Breath   Ciprofloxacin Hives and Shortness Of Breath    Diltiazem Shortness Of Breath    Swollen throat    Doxycycline Hives and Shortness Of Breath   Horse-Derived Products Anaphylaxis   Ketek [Telithromycin] Palpitations    Chest discomfort   Nitrofuran Derivatives Anaphylaxis and Hives    blisters   Nitrous Oxide Nausea And Vomiting    Severe due to Sjogrens (Auto-Immune Disease)   Other Anaphylaxis    ALLERGY TO HORSE SERUM   Penicillins Hives and Shortness Of Breath        Pentazocine Other (See Comments)    Other reaction(s): Mental Status Changes (intolerance) Altered Mental Status  Altered Mental Status  Altered Mental Status    Sulfa Antibiotics Hives and Shortness Of Breath   Trovan [Alatrofloxacin] Palpitations, Other (See Comments) and Anaphylaxis    Chest pain, dizziness, irregular pulse   Vitamin B12     Confusion  and shaking    Zinc Gelatin [Zinc] Anaphylaxis   Amlodipine Swelling   Calcium Channel Blockers     Respiratory distress   Carvedilol Other (See Comments)    Dizziness, "joint pain, depression"   Clindamycin/Lincomycin     CP, lock jaw   Codeine Nausea Only   Cymbalta [Duloxetine Hcl] Swelling   Diovan [Valsartan] Swelling   Lisinopril Swelling   Omeprazole     Generic with additives    Sertraline Other (See Comments)    confusion   Tramadol Nausea Only   Loteprednol Etabonate Rash    Family History  Problem Relation Age of Onset   Allergies Mother    COPD Mother    Breast cancer Mother    Allergic rhinitis Mother    Heart disease Father        No details   Kidney disease Father    Allergic rhinitis Father    Stroke Sister    Arthritis/Rheumatoid Sister    Asthma Sister    Lupus Sister    Heart attack Sister    Allergies Sister    Breast cancer Maternal Aunt        x 2   Stroke Paternal Grandmother    Scleroderma Grandchild    Thyroid disease Other     Prior to Admission medications   Medication Sig Start Date End Date Taking? Authorizing Provider  albuterol (VENTOLIN HFA) 108  (90 Base) MCG/ACT inhaler INHALE 2 PUFFS BY MOUTH EVERY 6 HOURS AS NEEDED FOR WHEEZING Patient not taking: Reported on 06/06/2021 08/10/18   Terald Sleeper, PA-C  atenolol (TENORMIN) 25 MG tablet Take 0.5 tablets (12.5 mg total) by mouth 2 (two) times daily. Patient taking differently: Take 25 mg by mouth daily. 05/16/21   [name], FNP  azithromycin (ZITHROMAX) 250 MG tablet Take 500 mg once, then 250 mg for four days Patient not taking: Reported on 06/06/2021 05/24/21   [name], FNP  furosemide (LASIX) 40 MG tablet Take 1 tablet (40 mg total) by mouth daily. Patient taking differently: Take 20-40 mg by mouth daily. 02/05/21 06/06/21  [name], FNP  hydrocortisone-pramoxine [facility]) 2.5-1 % rectal cream PLACE 1 APPLICATION RECTALLY SEE ADMINISTRATION INSTRUCTIONS, AFTER EACH BATHROOM USE 06/21/21   [name], FNP  levothyroxine (EUTHYROX) 50 MCG tablet Take 2 tablets (100 mcg total) by mouth every morning. 05/24/21   [name], FNP  methocarbamol (ROBAXIN) 750 MG tablet Take 1 tablet (750 mg total) by mouth 4 (four) times daily. 03/30/21   [name] A, FNP  nitroGLYCERIN (NITROSTAT) 0.3 MG SL tablet Place under the tongue. 02/28/21   [provider]  ondansetron (ZOFRAN) 4 MG tablet Take 1 tablet (4 mg total) by mouth every 8 (eight) hours as needed for nausea or vomiting. 06/15/21   [name], FNP  oxyCODONE-acetaminophen (PERCOCET) 7.5-325 MG tablet Take 1 tablet by mouth every 4 (four) hours as needed for severe pain. 05/28/21   [name], FNP  oxyCODONE-acetaminophen (PERCOCET) 7.5-325 MG tablet Take 1 tablet by mouth every 4 (four) hours as needed for severe pain. Patient not taking: Reported on 06/06/2021 05/28/21   [name], FNP  oxyCODONE-acetaminophen (PERCOCET) 7.5-325 MG tablet Take 1 tablet by mouth every 4 (four) hours as needed for severe pain. Patient not taking: Reported on 06/06/2021 05/01/21   [name], FNP   predniSONE (DELTASONE) 5 MG tablet Take 1 tablet (5 mg total) by mouth every morning. 06/05/21  Loman Brooklyn, FNP  RESTASIS 0.05 % ophthalmic emulsion INSTILL 1 DROP INTO [facility] EYE TWICE DAILY Patient not taking: Reported on 06/06/2021 11/12/19   [name] A, FNP  warfarin (COUMADIN) 1 MG tablet TAKE 4 TABLETS BY MOUTH ON MONDAY, WEDNESDAY AND FRIDAY. TAKE 2 TABLETS ON TUESDAY, THURSDAY, SATURDAY AND SUNDAY Patient taking differently: TAKE 3 TABLETS BY MOUTH ON MONDAY, AND FRIDAY. TAKE 2 TABLETS ON [place] Alfred AND SUNDAY 04/24/21   [name], FNP    Physical Exam: Vitals:   07/19/21 0040 07/19/21 0106 07/19/21 0207 07/19/21 0510  BP: 140/71 140/73 120/64 (!) 141/73  Pulse: 74 87 (!) 51 68  Resp: (!) '29 18  20  '$ Temp:  97.8 F (36.6 C)  98.2 F (36.8 C)  TempSrc:  Oral  Oral  SpO2: 92% 98%  92%  Weight:  51.9 kg    Height:  '5\' 4"'$  (1.626 m)     1.  General: Patient lying supine in bed,  no acute distress   2. Psychiatric: Alert and oriented x 3, mood is anxious.  Behavior is normal up until the end of the exam when she asked for pain medicine and then starts this shrill high-pitched sound when I told her that the ER doctor is currently addressing her pain medication.    3. Neurologic: Speech and language are normal, face is symmetric, moves all 4 extremities voluntarily, at baseline without acute deficits on limited exam   4. HEENMT:  Head is atraumatic, normocephalic, pupils reactive to light, periorbital edema, neck is supple, trachea is midline, mucous membranes are moist   5. Respiratory : Lungs are clear to auscultation bilaterally without wheezing, rhonchi, rales, no cyanosis, no increase in work of breathing or accessory muscle use, 2 L nasal cannula in place   6. Cardiovascular : Heart rate normal, rhythm is irregularly irregular, no murmurs, rubs or gallops, 2+ pitting edema,, peripheral pulses palpated   7. Gastrointestinal:   Abdomen is soft, nondistended, nontender to palpation bowel sounds active, no masses or organomegaly palpated   8. Skin:  Significant erythema of the bilateral lower extremities that is blanchable, 2+ pitting edema, no drainage   9.Musculoskeletal:  No acute deformities or trauma, no asymmetry in tone, 2+ pitting edema, peripheral pulses palpated, no tenderness to palpation in the extremities  Data Reviewed: In the ED Temp 98.2, heart rate 58-103, respiratory rate 15-25, blood pressure 116/82-157/127, satting at 100% No leukocytosis at 5.4, hemoglobin 13.8 Chemistry is unremarkable BNP 1463 INR 1.9 Chest x-ray shows stable small bilateral pleural effusions and bibasilar atelectasis EKG shows a heart rate of 95, A-fib, QTc 437 -Admission requested for acute respiratory failure with hypoxia likely secondary to CHF exacerbation  Assessment and Plan: * CHF exacerbation ([place]) - With edema in bilateral lower extremities, venous stasis dermatitis, periorbital edema, and new oxygen requirement 2 L nasal cannula -Known dilated cardiomyopathy -BNP up to 1400 from 600s -40 mg IV Lasix given in the ED -Continue 40 mg IV Lasix twice daily -Echo in the a.m. -Last echo October 2022 showed EF 30-35% with moderately decreased LV function, global hypokinesis -Continue beta-blocker -Continue to monitor  Hypokalemia - Potassium 3.4 -Given multiple doses of Lasix during this hospitalization will replete with 40 mEq of potassium and trend in the a.m.  Acute respiratory failure with hypoxia (HCC) - Most likely secondary to CHF exacerbation -Requiring 2 L nasal cannula -Chest x-ray shows stable small bilateral pleural effusions and bibasilar atelectasis -Continue treatment for CHF -  Wean off O2 as tolerated  Chronic pain - Continue Percocet and Robaxin -Continue pain scale  Edema of both lower extremities - With erythema of both lower extremities -Likely bilateral venous stasis  dermatitis -No leukocytosis, afebrile -Patient was started on treatment for cellulitis with vancomycin -Holding off on further antibiotics at this time bilateral same-day onset cellulitis is unlikely -Continue to monitor  Hypothyroidism - TSH 3.94 7 -Continue Synthroid  Atrial fibrillation, permanent (HCC) Continue beta-blocker Continue warfarin Check INR in the a.m. Last INR subtherapeutic at 1.9 Goal between 2-3 Continue to monitor      Advance Care Planning:   Code Status: Full Code   Consults: None  Family Communication: Niece at bedside  Severity of Illness: The appropriate patient status for this patient is INPATIENT. Inpatient status is judged to be reasonable and necessary in order to provide the required intensity of service to ensure the patient's safety. The patient's presenting symptoms, physical exam findings, and initial radiographic and laboratory data in the context of their chronic comorbidities is felt to place them at high risk for further clinical deterioration. Furthermore, it is not anticipated that the patient will be medically stable for discharge from the hospital within 2 midnights of admission.   * I certify that at the point of admission it is my clinical judgment that the patient will require inpatient hospital care spanning beyond 2 midnights from the point of admission due to high intensity of service, high risk for further deterioration and high frequency of surveillance required.*  Author: Rolla Plate, DO 07/19/2021 6:34 AM  For on call review [email or web address].

## 2021-07-19 NOTE — Assessment & Plan Note (Addendum)
-   repleted

## 2021-07-20 DIAGNOSIS — G8929 Other chronic pain: Secondary | ICD-10-CM | POA: Diagnosis not present

## 2021-07-20 DIAGNOSIS — I5041 Acute combined systolic (congestive) and diastolic (congestive) heart failure: Secondary | ICD-10-CM

## 2021-07-20 DIAGNOSIS — J9601 Acute respiratory failure with hypoxia: Secondary | ICD-10-CM | POA: Diagnosis not present

## 2021-07-20 DIAGNOSIS — I4821 Permanent atrial fibrillation: Secondary | ICD-10-CM | POA: Diagnosis not present

## 2021-07-20 DIAGNOSIS — E43 Unspecified severe protein-calorie malnutrition: Secondary | ICD-10-CM

## 2021-07-20 LAB — BASIC METABOLIC PANEL
Anion gap: 7 (ref 5–15)
BUN: 16 mg/dL (ref 8–23)
CO2: 30 mmol/L (ref 22–32)
Calcium: 8.4 mg/dL — ABNORMAL LOW (ref 8.9–10.3)
Chloride: 97 mmol/L — ABNORMAL LOW (ref 98–111)
Creatinine, Ser: 0.69 mg/dL (ref 0.44–1.00)
GFR, Estimated: >60 mL/min (ref >60)
Glucose, Bld: 96 mg/dL (ref 70–99)
Potassium: 3.4 mmol/L — ABNORMAL LOW (ref 3.5–5.1)
Sodium: 134 mmol/L — ABNORMAL LOW (ref 135–145)

## 2021-07-20 LAB — BRAIN NATRIURETIC PEPTIDE: B Natriuretic Peptide: 1329 pg/mL — ABNORMAL HIGH (ref 0.0–100.0)

## 2021-07-20 LAB — PROTIME-INR
INR: 1.8 — ABNORMAL HIGH (ref 0.8–1.2)
Prothrombin Time: 20.4 seconds — ABNORMAL HIGH (ref 11.4–15.2)

## 2021-07-20 LAB — CBC
HCT: 40.3 % (ref 36.0–46.0)
Hemoglobin: 13.3 g/dL (ref 12.0–15.0)
MCH: 32.8 pg (ref 26.0–34.0)
MCHC: 33 g/dL (ref 30.0–36.0)
MCV: 99.3 fL (ref 80.0–100.0)
Platelets: 103 10*3/uL — ABNORMAL LOW (ref 150–400)
RBC: 4.06 MIL/uL (ref 3.87–5.11)
RDW: 13.8 % (ref 11.5–15.5)
WBC: 5.1 10*3/uL (ref 4.0–10.5)
nRBC: 0 % (ref 0.0–0.2)

## 2021-07-20 LAB — MAGNESIUM: Magnesium: 1.7 mg/dL (ref 1.7–2.4)

## 2021-07-20 MED ORDER — WARFARIN SODIUM 2 MG PO TABS
3.0000 mg | ORAL_TABLET | Freq: Once | ORAL | Status: AC
  Administered 2021-07-20: 3 mg via ORAL
  Filled 2021-07-20: qty 1

## 2021-07-20 MED ORDER — METHOCARBAMOL 500 MG PO TABS
750.0000 mg | ORAL_TABLET | ORAL | Status: DC | PRN
  Administered 2021-07-20 – 2021-07-24 (×19): 750 mg via ORAL
  Filled 2021-07-20 (×20): qty 2

## 2021-07-20 MED ORDER — HYDROCORT-PRAMOXINE (PERIANAL) 2.5-1 % EX CREA: 1.0000 | TOPICAL_CREAM | Freq: Every day | CUTANEOUS | Status: DC | PRN

## 2021-07-20 NOTE — Progress Notes (Signed)
PROGRESS NOTE   Angela Wong  HGD:[id number] DOB: 1940-03-13 DOA: 07/18/2021 PCP: [name], FNP   Chief Complaint  Patient presents with   Leg Swelling   Level of care: Telemetry  Brief Admission History:  81 y.o. female with medical history significant of atrial fibrillation, cerebral vasculitis, congestive dilated cardiomyopathy, COPD, fibromyalgia, GERD, hyperparathyroidism, hypothyroidism, Sjogren's, situational depression, sick sinus syndrome, Raynaud's, vitamin D deficiency, 27 medication allergies, and more presents to ED with a chief complaint of difficulty with ambulation.   She was admitted with acute HFrEF   Assessment and Plan: * Acute HFrEF - With edema in bilateral lower extremities, venous stasis dermatitis, periorbital edema, and new oxygen requirement 2 L nasal cannula -Known dilated cardiomyopathy -BNP up to 1400 from 600s -40 mg IV Lasix given in the ED -Continue 30 mg IV Lasix twice daily -Echo in the a.m. -Unfortunately Echo 7/6 with findings of further reduced EF down to 20-25% which likely explains fluid gain and increased fatigue and somnolence  -Continue beta-blocker -Continue to monitor   Intake/Output Summary (Last 24 hours) at 07/20/2021 1529 Last data filed at 07/20/2021 0518 Gross per 24 hour  Intake 200 ml  Output 1350 ml  Net -1150 ml   Filed Weights   07/18/21 2120 07/19/21 0106 07/20/21 0516  Weight: 49 kg 51.9 kg 54.4 kg     Protein-calorie malnutrition, severe -- consulted to dietitian   Hypokalemia - Potassium 3.4 -continue to supplement potassium while on IV lasix   Acute respiratory failure with hypoxia (HCC) - Most likely secondary to CHF exacerbation -wean oxygen as tolerated  -Chest x-ray shows stable small bilateral pleural effusions and bibasilar atelectasis -Continue treatment for CHF -Wean off O2 as tolerated  Chronic pain - Continue Percocet and Robaxin -Continue pain scale  Edema of both lower  extremities - With erythema of both lower extremities -Likely bilateral venous stasis dermatitis -No leukocytosis, afebrile -Patient was started on treatment for cellulitis with vancomycin -Holding off on further antibiotics at this time bilateral same-day onset cellulitis is unlikely -Continue to monitor -improving with IV lasix diuresis   Hypothyroidism - TSH 3.94 7 -Continue Synthroid  Atrial fibrillation, permanent (HCC) Continue beta-blocker Continue warfarin Check INR in the a.m. Last INR subtherapeutic at 1.9 Goal between 2-3 Continue to monitor  DVT prophylaxis: warfarin  Code Status: Full  Family Communication:  Disposition: Status is: Inpatient Remains inpatient appropriate because: intensity, requiring IV lasix    Consultants:   Procedures:   Antimicrobials:    Subjective: Pt reports uncontrolled pain symptoms, improving edema in legs Objective: Vitals:   07/19/21 1400 07/19/21 2103 07/20/21 0516 07/20/21 1437  BP: 132/76 133/82 116/71 (!) 143/91  Pulse: 66 72 (!) 59 68  Resp: '18 18 19 18  '$ Temp: 98.6 F (37 C) 97.8 F (36.6 C) 97.8 F (36.6 C) 98.3 F (36.8 C)  TempSrc: Oral Oral Oral Oral  SpO2: 100% 100% 96% 97%  Weight:   54.4 kg   Height:   '5\' 4"'$  (1.626 m)     Intake/Output Summary (Last 24 hours) at 07/20/2021 1531 Last data filed at 07/20/2021 0518 Gross per 24 hour  Intake 200 ml  Output 1350 ml  Net -1150 ml   Filed Weights   07/18/21 2120 07/19/21 0106 07/20/21 0516  Weight: 49 kg 51.9 kg 54.4 kg   Examination:  General exam: Appears calm and comfortable  Respiratory system: crackles left base. Respiratory effort normal. Cardiovascular system: normal S1 & S2 heard. No JVD,  murmurs, rubs, gallops or clicks. trace pedal edema bilateral. Gastrointestinal system: Abdomen is nondistended, soft and nontender. No organomegaly or masses felt. Normal bowel sounds heard. Central nervous system: Alert and oriented. No focal neurological  deficits. Extremities: Symmetric 5 x 5 power. Skin: No rashes, lesions or ulcers. Psychiatry: Judgement and insight appear normal. Mood & affect appropriate.   Data Reviewed: I have personally reviewed following labs and imaging studies  CBC: Recent Labs  Lab 07/18/21 2252 07/20/21 0522  WBC 5.4 5.1  HGB 13.8 13.3  HCT 42.5 40.3  MCV 98.8 99.3  PLT 107* 103*    Basic Metabolic Panel: Recent Labs  Lab 07/18/21 2252 07/19/21 0519 07/20/21 0522  NA 137 136 134*  K 3.4* 3.3* 3.4*  CL 104 102 97*  CO2 '26 28 30  '$ GLUCOSE 85 79 96  BUN '19 18 16  '$ CREATININE 0.65 0.60 0.69  CALCIUM 8.6* 8.6* 8.4*  MG 2.0 2.0 1.7    CBG: No results for input(s): "GLUCAP" in the last 168 hours.  Recent Results (from the past 240 hour(s))  SARS Coronavirus 2 by RT PCR (hospital order, performed in  [facility] hospital lab) *cepheid single result test* Anterior Nasal Swab     Status: None   Collection Time: 07/19/21 12:25 AM   Specimen: Anterior Nasal Swab  Result Value Ref Range Status   SARS Coronavirus 2 by RT PCR NEGATIVE NEGATIVE Final    Comment: (NOTE) SARS-CoV-2 target nucleic acids are NOT DETECTED.  The SARS-CoV-2 RNA is generally detectable in upper and lower respiratory specimens during the acute phase of infection. The lowest concentration of SARS-CoV-2 viral copies this assay can detect is 250 copies / mL. A negative result does not preclude SARS-CoV-2 infection and should not be used as the sole basis for treatment or other patient management decisions.  A negative result may occur with improper specimen collection / handling, submission of specimen other than nasopharyngeal swab, presence of viral mutation(s) within the areas targeted by this assay, and inadequate number of viral copies (<250 copies / mL). A negative result must be combined with clinical observations, patient history, and epidemiological information.  Fact Sheet for Patients:    https://www.patel.info/  Fact Sheet for Healthcare Providers: https://hall.com/  This test is not yet approved or  cleared by the [place] FDA and has been authorized for detection and/or diagnosis of SARS-CoV-2 by FDA under an Emergency Use Authorization (EUA).  This EUA will remain in effect (meaning this test can be used) for the duration of the COVID-19 declaration under Section 564(b)(1) of the Act, 21 U.S.C. section 360bbb-3(b)(1), unless the authorization is terminated or revoked sooner.  Performed at [facility], [address], [place], [place] [address]      Radiology Studies: ECHOCARDIOGRAM COMPLETE  Result Date: 07/19/2021    ECHOCARDIOGRAM REPORT   Patient Name:   Angela Wong Date of Exam: 07/19/2021 Medical Rec #:  [id number]       Height:       64.0 in Accession #:    [id number]      Weight:       114.4 lb Date of Birth:  1940/07/17      BSA:          1.543 m Patient Age:    49 years        BP:           141/73 mmHg Patient Gender: F  HR:           68 bpm. Exam Location:  [facility] Procedure: 2D Echo, Cardiac Doppler and Color Doppler Indications:    CHF  History:        Patient has prior history of Echocardiogram examinations, most                 recent 10/27/2020. Cardiomyopathy and CHF, Previous Myocardial                 Infarction and CAD, Pacemaker, Arrythmias:Atrial Fibrillation,                 Signs/Symptoms:Fatigue, Dyspnea and Chest Pain; Risk                 Factors:Hypertension and Dyslipidemia. Ovarian CA, Raunaud's                 disease.  Sonographer:    Wenda Low Referring Phys: [id number] ASIA B Byron  1. Left ventricular ejection fraction, by estimation, is 20 to 25%. The left ventricle has severely decreased function. The left ventricle demonstrates global hypokinesis. Left ventricular diastolic parameters are indeterminate.  2. Right ventricular systolic function is mildly  reduced. The right ventricular size is mildly enlarged. There is moderately elevated pulmonary artery systolic pressure. The estimated right ventricular systolic pressure is 45.4 mmHg.  3. Left atrial size was severely dilated.  4. Right atrial size was severely dilated.  5. A small pericardial effusion is present. The pericardial effusion is posterior and lateral to the left ventricle.  6. The mitral valve is grossly normal. Mild mitral valve regurgitation.  7. Tricuspid valve regurgitation is severe.  8. The aortic valve is tricuspid. There is mild calcification of the aortic valve. There is mild thickening of the aortic valve. Aortic valve regurgitation is trivial. Aortic valve sclerosis/calcification is present, without any evidence of aortic stenosis.  9. The inferior vena cava is dilated in size with <50% respiratory variability, suggesting right atrial pressure of 15 mmHg. Comparison(s): Compared to prior TTE on 10/2020, the LVEF has decreased from 30-35% to 20-25% and the TR is now severe (previously moderate). FINDINGS  Left Ventricle: Left ventricular ejection fraction, by estimation, is 20 to 25%. The left ventricle has severely decreased function. The left ventricle demonstrates global hypokinesis. The left ventricular internal cavity size was normal in size. There is no left ventricular hypertrophy. Left ventricular diastolic parameters are indeterminate. Right Ventricle: The right ventricular size is mildly enlarged. No increase in right ventricular wall thickness. Right ventricular systolic function is mildly reduced. There is moderately elevated pulmonary artery systolic pressure. The tricuspid regurgitant velocity is 3.25 m/s, and with an assumed right atrial pressure of 15 mmHg, the estimated right ventricular systolic pressure is 09.8 mmHg. Left Atrium: Left atrial size was severely dilated. Right Atrium: Right atrial size was severely dilated. Pericardium: A small pericardial effusion is present.  The pericardial effusion is posterior and lateral to the left ventricle. Mitral Valve: The mitral valve is grossly normal. There is mild thickening of the mitral valve leaflet(s). There is mild calcification of the mitral valve leaflet(s). Mild to moderate mitral annular calcification. Mild mitral valve regurgitation. MV peak  gradient, 4.2 mmHg. The mean mitral valve gradient is 1.0 mmHg. Tricuspid Valve: The tricuspid valve is normal in structure. Tricuspid valve regurgitation is severe. Aortic Valve: The aortic valve is tricuspid. There is mild calcification of the aortic valve. There is mild thickening of the aortic valve. Aortic valve  regurgitation is trivial. Aortic valve sclerosis/calcification is present, without any evidence of aortic stenosis. Aortic valve mean gradient measures 2.0 mmHg. Aortic valve peak gradient measures 4.6 mmHg. Aortic valve area, by VTI measures 2.15 cm. Pulmonic Valve: The pulmonic valve was normal in structure. Pulmonic valve regurgitation is mild. Aorta: The aortic root is normal in size and structure. Venous: The inferior vena cava is dilated in size with less than 50% respiratory variability, suggesting right atrial pressure of 15 mmHg. IAS/Shunts: The atrial septum is grossly normal. Additional Comments: A device lead is visualized.  LEFT VENTRICLE PLAX 2D LVIDd:         5.20 cm      Diastology LVIDs:         4.30 cm      LV e' medial:    6.20 cm/s LV PW:         1.00 cm      LV E/e' medial:  16.9 LV IVS:        0.80 cm      LV e' lateral:   10.30 cm/s LVOT diam:     2.00 cm      LV E/e' lateral: 10.2 LV SV:         40 LV SV Index:   26 LVOT Area:     3.14 cm  LV Volumes (MOD) LV vol d, MOD A2C: 101.0 ml LV vol d, MOD A4C: 70.0 ml LV vol s, MOD A2C: 73.2 ml LV vol s, MOD A4C: 55.5 ml LV SV MOD A2C:     27.8 ml LV SV MOD A4C:     70.0 ml LV SV MOD BP:      23.2 ml RIGHT VENTRICLE RV Basal diam:  4.00 cm RV Mid diam:    2.60 cm RV S prime:     13.70 cm/s TAPSE (M-mode): 2.0 cm  LEFT ATRIUM             Index        RIGHT ATRIUM           Index LA diam:        4.70 cm 3.05 cm/m   RA Area:     28.60 cm LA Vol (A2C):   93.2 ml 60.41 ml/m  RA Volume:   107.00 ml 69.35 ml/m LA Vol (A4C):   88.0 ml 57.03 ml/m LA Biplane Vol: 94.2 ml 61.05 ml/m  AORTIC VALVE                    PULMONIC VALVE AV Area (Vmax):    2.35 cm     PV Vmax:       0.69 m/s AV Area (Vmean):   1.99 cm     PV Peak grad:  1.9 mmHg AV Area (VTI):     2.15 cm AV Vmax:           107.50 cm/s AV Vmean:          65.600 cm/s AV VTI:            0.187 m AV Peak Grad:      4.6 mmHg AV Mean Grad:      2.0 mmHg LVOT Vmax:         80.30 cm/s LVOT Vmean:        41.500 cm/s LVOT VTI:          0.128 m LVOT/AV VTI ratio: 0.68  AORTA Ao Root diam: 2.90 cm MITRAL VALVE  TRICUSPID VALVE MV Area (PHT): 3.39 cm     TR Peak grad:   42.2 mmHg MV Area VTI:   1.48 cm     TR Vmax:        325.00 cm/s MV Peak grad:  4.2 mmHg MV Mean grad:  1.0 mmHg     SHUNTS MV Vmax:       1.02 m/s     Systemic VTI:  0.13 m MV Vmean:      42.0 cm/s    Systemic Diam: 2.00 cm MV Decel Time: 224 msec MV E velocity: 105.00 cm/s Gwyndolyn Kaufman MD Electronically signed by Gwyndolyn Kaufman MD Signature Date/Time: 07/19/2021/11:49:56 AM    Final    DG Foot Complete Right  Result Date: 07/18/2021 CLINICAL DATA:  Right foot pain swelling EXAM: RIGHT FOOT COMPLETE - 3+ VIEW COMPARISON:  None Available. FINDINGS: Osteopenia is noted. No acute fracture or dislocation is noted. No soft tissue abnormality is seen. IMPRESSION: No acute abnormality noted. Electronically Signed   By: Inez Catalina M.D.   On: 07/18/2021 23:54   DG Foot Complete Left  Result Date: 07/18/2021 CLINICAL DATA:  Foot swelling and redness, initial encounter EXAM: LEFT FOOT - COMPLETE 3+ VIEW COMPARISON:  None Available. FINDINGS: Mild osteopenia is noted. No acute fracture or dislocation is seen. No soft tissue abnormality is noted. IMPRESSION: Osteopenia without acute abnormality.  Electronically Signed   By: Inez Catalina M.D.   On: 07/18/2021 23:53   DG Chest Portable 1 View  Result Date: 07/18/2021 CLINICAL DATA:  Dyspnea, lower extremity edema EXAM: PORTABLE CHEST 1 VIEW COMPARISON:  06/06/2021 FINDINGS: Single frontal view of the chest demonstrates stable single lead pacer. Cardiac silhouette is unremarkable. There are small bilateral pleural effusions unchanged. Patchy bibasilar consolidation consistent with atelectasis. No pneumothorax. No acute bony abnormalities. IMPRESSION: 1. Stable small bilateral pleural effusions and bibasilar atelectasis. Electronically Signed   By: Randa Ngo M.D.   On: 07/18/2021 23:53    Scheduled Meds:  atenolol  25 mg Oral Daily   furosemide  30 mg Intravenous BID   levothyroxine  100 mcg Oral q morning   potassium chloride  40 mEq Oral Daily   predniSONE  5 mg Oral q morning   warfarin  3 mg Oral ONCE-1600   Warfarin - Pharmacist Dosing Inpatient   Does not apply q1600   Continuous Infusions:   LOS: 2 days   Time spent: 36 mins  Izumi Mixon Wynetta Emery, MD How to contact the [facility] Attending or Consulting provider [place] or covering provider during after hours [place], for this patient?  Check the care team in [facility] and look for a) attending/consulting TR[facility] provider listed and b) the [facility] team listed Log into [email or web address] and use [place]'s universal password to access. If you do not have the password, please contact the hospital operator. Locate the [facility] provider you are looking for under [facility] and page to a number that you can be directly reached. If you still have difficulty reaching the provider, please page the [facility] (Director on Call) for the Hospitalists listed on amion for assistance.  07/20/2021, 3:31 PM

## 2021-07-20 NOTE — Care Management Important Message (Signed)
Important Message  Patient Details  Name: NETRA POSTLETHWAIT MRN: [id number] Date of Birth: [birth year]   Medicare Important Message Given:  N/A - LOS <3 / Initial given by admissions     Dannette Barbara 07/20/2021, 2:24 PM

## 2021-07-20 NOTE — Progress Notes (Signed)
ANTICOAGULATION CONSULT NOTE - Initial Up Consult   Pharmacy Consult for warfarin dosing  Indication: atrial fibrillation   Allergies  Allergen Reactions   Cephalosporins Hives and Shortness Of Breath   Ciprofloxacin Hives and Shortness Of Breath   Diltiazem Shortness Of Breath    Swollen throat    Doxycycline Hives and Shortness Of Breath   Horse-Derived Products Anaphylaxis   Ketek [Telithromycin] Palpitations    Chest discomfort   Nitrofuran Derivatives Anaphylaxis and Hives    blisters   Nitrous Oxide Nausea And Vomiting    Severe due to Sjogrens (Auto-Immune Disease)   Other Anaphylaxis    ALLERGY TO HORSE SERUM   Penicillins Hives and Shortness Of Breath        Pentazocine Other (See Comments)    Other reaction(s): Mental Status Changes (intolerance) Altered Mental Status  Altered Mental Status  Altered Mental Status    Sulfa Antibiotics Hives and Shortness Of Breath   Trovan [Alatrofloxacin] Palpitations, Other (See Comments) and Anaphylaxis    Chest pain, dizziness, irregular pulse   Vitamin B12     Confusion and shaking    Zinc Gelatin [Zinc] Anaphylaxis   Amlodipine Swelling   Calcium Channel Blockers     Respiratory distress   Carvedilol Other (See Comments)    Dizziness, "joint pain, depression"   Clindamycin/Lincomycin     CP, lock jaw   Codeine Nausea Only   Cymbalta [Duloxetine Hcl] Swelling   Diovan [Valsartan] Swelling   Lisinopril Swelling   Omeprazole     Generic with additives    Sertraline Other (See Comments)    confusion   Tramadol Nausea Only   Loteprednol Etabonate Rash      Patient Measurements: Last Weight  Most recent update: 07/20/2021  5:19 AM    Weight  54.4 kg (119 lb 14.4 oz)            Body mass index is 20.58 kg/m. Angela Wong               Temp: 97.8 F (36.6 C) (07/07 0516) Temp Source: Oral (07/07 0516) BP: 116/71 (07/07 0516) Pulse Rate: 59 (07/07 0516)  Labs: Recent Labs    07/18/21 2252  07/19/21 0519 07/20/21 0522  HGB 13.8  --  13.3  HCT 42.5  --  40.3  PLT 107*  --  103*  LABPROT 21.9* 20.7* 20.4*  INR 1.9* 1.8* 1.8*  CREATININE 0.65 0.60 0.69     Estimated Creatinine Clearance: 47.4 mL/min (by C-G formula based on SCr of 0.69 mg/dL).     Medications:  Medications Prior to Admission  Medication Sig Dispense Refill Last Dose   furosemide (LASIX) 40 MG tablet Take 1 tablet (40 mg total) by mouth daily. (Patient taking differently: Take 20-40 mg by mouth daily as needed for edema or fluid.) 90 tablet 1 07/18/2021   hydrocortisone-pramoxine (ANALPRAM-HC) 2.5-1 % rectal cream PLACE 1 APPLICATION RECTALLY SEE ADMINISTRATION INSTRUCTIONS, AFTER EACH BATHROOM USE (Patient taking differently: Place 1 Application rectally daily as needed for hemorrhoids or anal itching.) 60 g 0 Past Week   levothyroxine (EUTHYROX) 50 MCG tablet Take 2 tablets (100 mcg total) by mouth every morning. 180 tablet 3 07/18/2021   methocarbamol (ROBAXIN) 750 MG tablet Take 1 tablet (750 mg total) by mouth 4 (four) times daily. (Patient taking differently: Take 750 mg by mouth every 4 (four) hours as needed for muscle spasms.) 180 tablet 2 07/18/2021 at 0400   ondansetron (ZOFRAN) 4 MG tablet Take  1 tablet (4 mg total) by mouth every 8 (eight) hours as needed for nausea or vomiting. 60 tablet 0 UNK   oxyCODONE-acetaminophen (PERCOCET) 7.5-325 MG tablet Take 1 tablet by mouth every 4 (four) hours as needed for severe pain. 180 tablet 0 07/17/2021 at 0400   predniSONE (DELTASONE) 5 MG tablet Take 1 tablet (5 mg total) by mouth every morning. 90 tablet 0 07/17/2021   warfarin (COUMADIN) 1 MG tablet TAKE 4 TABLETS BY MOUTH ON MONDAY, WEDNESDAY AND FRIDAY. TAKE 2 TABLETS ON TUESDAY, THURSDAY, SATURDAY AND SUNDAY (Patient taking differently: Take 5 mg by mouth daily.) 90 tablet 0 07/17/2021 at 1630   nitroGLYCERIN (NITROSTAT) 0.3 MG SL tablet Place 0.3 mg under the tongue every 5 (five) minutes as needed for chest pain.    UNK   Scheduled:   atenolol  25 mg Oral Daily   furosemide  30 mg Intravenous BID   levothyroxine  100 mcg Oral q morning   potassium chloride  40 mEq Oral Daily   predniSONE  5 mg Oral q morning   Warfarin - Pharmacist Dosing Inpatient   Does not apply q1600   Infusions:  PRN: acetaminophen **OR** acetaminophen, HYDROmorphone (DILAUDID) injection, methocarbamol, ondansetron **OR** ondansetron (ZOFRAN) IV, mouth rinse, oxyCODONE-acetaminophen Anti-infectives (From admission, onward)    Start     Dose/Rate Route Frequency Ordered Stop   07/18/21 2315  vancomycin (VANCOCIN) IVPB 1000 mg/200 mL premix        1,000 mg 200 mL/hr over 60 Minutes Intravenous  Once 07/18/21 2309 07/19/21 0039       Goal of Therapy:  INR 2-3 Monitor platelets by anticoagulation protocol: Yes    Prior to Admission Warfarin Dosing:  Angela Wong takes '3mg'$  of warfarin Monday and Friday, '2mg'$  ROW      Admit INR was 1.9 Lab Results  Component Value Date   INR 1.8 (H) 07/20/2021   INR 1.8 (H) 07/19/2021   INR 1.9 (H) 07/18/2021    Assessment: Angela Wong a 81 y.o. female requires anticoagulation with warfarin for the indication of  atrial fibrillation. Warfarin will be initiated inpatient following pharmacy protocol per pharmacy consult. Patient most recent blood work is as follows:    Latest Ref Rng & Units 07/20/2021    5:22 AM 07/18/2021   10:52 PM 06/06/2021    5:06 PM  CBC  WBC 4.0 - 10.5 K/uL 5.1  5.4  10.4   Hemoglobin 12.0 - 15.0 g/dL 13.3  13.8  15.6   Hematocrit 36.0 - 46.0 % 40.3  42.5  47.3   Platelets 150 - 400 K/uL 103  107  129      Plan: Warfarin '3mg'$  po x 1  Monitor CBC MWF with am labs   Monitor INR daily Monitor for signs and symptoms of bleeding   Donna Christen Claudis Giovanelli, PharmD, MBA, BCGP Clinical Pharmacist

## 2021-07-20 NOTE — Evaluation (Signed)
Physical Therapy Evaluation Patient Details Name: Angela Wong MRN: [id number] DOB: 08-Dec-1940 Today's Date: 07/20/2021  History of Present Illness  Angela Wong is a 81 y.o. female with medical history significant of atrial fibrillation, cerebral vasculitis, congestive dilated cardiomyopathy, COPD, fibromyalgia, GERD, hyperparathyroidism, hypothyroidism, Sjogren's, situational depression, sick sinus syndrome, Raynaud's, vitamin D deficiency, 27 medication allergies, and more presents to ED with a chief complaint of difficulty with ambulation.   Clinical Impression  Patient limited for functional mobility as stated below secondary to BLE weakness, fatigue and generalized pain. Attempted to move RLE toward EOB but patient with c/o severe pain. Patient attempts to pull to seated but is limited by pain and weakness. Assisted with repositioning of BLE with pillows to offload heels. Patient will benefit from continued physical therapy in hospital and recommended venue below to increase strength, balance, endurance for safe ADLs and gait.        Recommendations for follow up therapy are one component of a multi-disciplinary discharge planning process, led by the attending physician.  Recommendations may be updated based on patient status, additional functional criteria and insurance authorization.  Follow Up Recommendations Skilled nursing-short term rehab (<3 hours/day) Can patient physically be transported by private vehicle: No    Assistance Recommended at Discharge Frequent or constant Supervision/Assistance  Patient can return home with the following  A lot of help with walking and/or transfers;A lot of help with bathing/dressing/bathroom;Assistance with cooking/housework;Assist for transportation;Help with stairs or ramp for entrance    Equipment Recommendations None recommended by PT  Recommendations for Other Services       Functional Status Assessment Patient has had a recent  decline in their functional status and demonstrates the ability to make significant improvements in function in a reasonable and predictable amount of time.     Precautions / Restrictions Precautions Precautions: Fall Restrictions Weight Bearing Restrictions: No      Mobility  Bed Mobility Overal bed mobility: Needs Assistance             General bed mobility comments: attempted to have patient pull to seated but she is unable due to pain despite several attempts    Transfers                        Ambulation/Gait                  Stairs            Wheelchair Mobility    Modified Rankin (Stroke Patients Only)       Balance                                             Pertinent Vitals/Pain Pain Assessment Pain Assessment: Faces Faces Pain Scale: Hurts worst Pain Location: generalized Pain Descriptors / Indicators: Crying, Grimacing, Guarding Pain Intervention(s): Limited activity within patient's tolerance, Monitored during session, Patient requesting pain meds-RN notified, Repositioned    Home Living Family/patient expects to be discharged to:: Private residence Living Arrangements: Alone Available Help at Discharge: Personal care attendant;Available PRN/intermittently (states almost 24/7 assist) Type of Home: House Home Access: Ramped entrance       Home Layout: One level Home Equipment: Conservation officer, nature (2 wheels);Rollator (4 wheels);Transport chair;Hospital bed;BSC/3in1      Prior Function Prior Level of Function : Needs assist  Mobility Comments: Patient states transfers chair to/from [facility] with assist ADLs Comments: assisted by aid and family     Hand Dominance        Extremity/Trunk Assessment   Upper Extremity Assessment Upper Extremity Assessment: Generalized weakness    Lower Extremity Assessment Lower Extremity Assessment: Generalized weakness    Cervical / Trunk  Assessment Cervical / Trunk Assessment: Kyphotic  Communication   Communication: No difficulties  Cognition Arousal/Alertness: Awake/alert Behavior During Therapy: WFL for tasks assessed/performed Overall Cognitive Status: Within Functional Limits for tasks assessed                                          General Comments      Exercises     Assessment/Plan    PT Assessment Patient needs continued PT services  PT Problem List Decreased strength;Decreased mobility;Decreased activity tolerance;Decreased balance;Pain       PT Treatment Interventions DME instruction;Therapeutic activities;Gait training;Therapeutic exercise;Patient/family education;Stair training;Balance training;Functional mobility training;Neuromuscular re-education;Manual techniques    PT Goals (Current goals can be found in the Care Plan section)  Acute Rehab PT Goals Patient Stated Goal: decrease pain PT Goal Formulation: With patient Time For Goal Achievement: 08/03/21 Potential to Achieve Goals: Fair    Frequency Min 3X/week     Co-evaluation               AM-PAC PT "6 Clicks" Mobility  Outcome Measure Help needed turning from your back to your side while in a flat bed without using bedrails?: A Lot Help needed moving from lying on your back to sitting on the side of a flat bed without using bedrails?: A Lot Help needed moving to and from a bed to a chair (including a wheelchair)?: A Lot Help needed standing up from a chair using your arms (e.g., wheelchair or bedside chair)?: A Lot Help needed to walk in hospital room?: A Lot Help needed climbing 3-5 steps with a railing? : A Lot 6 Click Score: 12    End of Session Equipment Utilized During Treatment: Oxygen Activity Tolerance: Patient limited by pain;Patient limited by fatigue Patient left: in bed;with family/visitor present;with call bell/phone within reach Nurse Communication: Mobility status;Patient requests pain  meds PT Visit Diagnosis: Other abnormalities of gait and mobility (R26.89);Muscle weakness (generalized) (M62.81)    Time: 3710-6269 PT Time Calculation (min) (ACUTE ONLY): 20 min   Charges:   PT Evaluation $PT Eval Moderate Complexity: 1 Mod PT Treatments $Therapeutic Activity: 8-22 mins        10:17 AM, 07/20/21 Mearl Latin PT, DPT Physical Therapist at [facility]

## 2021-07-20 NOTE — TOC Progression Note (Signed)
Transition of Care [facility]) - Progression Note    Patient Details  Name: Angela Wong MRN: [id number] Date of Birth: 1940/05/13  Transition of Care [facility]) CM/SW [place], [place] Phone Number: 07/20/2021, 10:56 AM  Clinical Narrative:    CSW updated that PT is recommending SNF for pt at D/C. CSW spoke with pt about this recommendation. Pt states that she does not want to go to SNF and will be returning home at D/C. Pt states that she has round the clock care and would like HH. [place] referral has been sent to [facility]. They accepted and TOC will request MD place orders at D/C. TOC to follow.   Expected Discharge Plan: [place] Barriers to Discharge: Continued Medical Work up  Expected Discharge Plan and Services Expected Discharge Plan: [place] In-house Referral: Clinical Social Work   Post Acute Care Choice: [place] arrangements for the past 2 months: [place] Determinants of Health (SDOH) Interventions    Readmission Risk Interventions    07/19/2021    2:11 PM 10/25/2020    3:19 PM  Readmission Risk Prevention Plan  Transportation Screening Complete Complete  HRI or Home Care Consult Complete Complete  Social Work Consult for [place] Planning/Counseling Complete Complete  Palliative Care Screening Not Applicable Not Applicable  Medication Review Press photographer) Complete Complete

## 2021-07-20 NOTE — Plan of Care (Signed)
  Problem: Acute Rehab PT Goals(only PT should resolve) Goal: Pt Will Go Supine/Side To Sit Outcome: Progressing Flowsheets (Taken 07/20/2021 1019) Pt will go Supine/Side to Sit:  with minimal assist  with moderate assist Goal: Pt Will Go Sit To Supine/Side Outcome: Progressing Flowsheets (Taken 07/20/2021 1019) Pt will go Sit to Supine/Side:  with minimal assist  with moderate assist Goal: Patient Will Perform Sitting Balance Outcome: Progressing Flowsheets (Taken 07/20/2021 1019) Patient will perform sitting balance:  with minimal assist  with moderate assist Goal: Pt/caregiver will Perform Home Exercise Program Outcome: Progressing Flowsheets (Taken 07/20/2021 1019) Pt/caregiver will Perform Home Exercise Program:  For increased strengthening  For increased ROM  For improved balance  With Supervision, verbal cues required/provided  With minimal assist  10:19 AM, 07/20/21 Mearl Latin PT, DPT Physical Therapist at [facility]

## 2021-07-20 NOTE — Assessment & Plan Note (Signed)
--   consulted to dietitian

## 2021-07-21 DIAGNOSIS — G8929 Other chronic pain: Secondary | ICD-10-CM | POA: Diagnosis not present

## 2021-07-21 DIAGNOSIS — I5041 Acute combined systolic (congestive) and diastolic (congestive) heart failure: Secondary | ICD-10-CM | POA: Diagnosis not present

## 2021-07-21 DIAGNOSIS — J9601 Acute respiratory failure with hypoxia: Secondary | ICD-10-CM | POA: Diagnosis not present

## 2021-07-21 DIAGNOSIS — I4821 Permanent atrial fibrillation: Secondary | ICD-10-CM | POA: Diagnosis not present

## 2021-07-21 LAB — PROTIME-INR
INR: 1.7 — ABNORMAL HIGH (ref 0.8–1.2)
Prothrombin Time: 19.7 seconds — ABNORMAL HIGH (ref 11.4–15.2)

## 2021-07-21 LAB — BASIC METABOLIC PANEL
Anion gap: 7 (ref 5–15)
BUN: 19 mg/dL (ref 8–23)
CO2: 31 mmol/L (ref 22–32)
Calcium: 8.3 mg/dL — ABNORMAL LOW (ref 8.9–10.3)
Chloride: 96 mmol/L — ABNORMAL LOW (ref 98–111)
Creatinine, Ser: 0.62 mg/dL (ref 0.44–1.00)
GFR, Estimated: >60 mL/min (ref >60)
Glucose, Bld: 92 mg/dL (ref 70–99)
Potassium: 3.3 mmol/L — ABNORMAL LOW (ref 3.5–5.1)
Sodium: 134 mmol/L — ABNORMAL LOW (ref 135–145)

## 2021-07-21 LAB — MAGNESIUM: Magnesium: 1.9 mg/dL (ref 1.7–2.4)

## 2021-07-21 MED ORDER — POTASSIUM CHLORIDE 20 MEQ PO PACK
40.0000 meq | PACK | Freq: Two times a day (BID) | ORAL | Status: DC
  Administered 2021-07-21 – 2021-07-22 (×3): 40 meq via ORAL
  Filled 2021-07-21 (×3): qty 2

## 2021-07-21 MED ORDER — WARFARIN SODIUM 2 MG PO TABS
3.0000 mg | ORAL_TABLET | Freq: Once | ORAL | Status: AC
  Administered 2021-07-21: 3 mg via ORAL
  Filled 2021-07-21: qty 1

## 2021-07-21 MED ORDER — HYDROMORPHONE HCL 1 MG/ML IJ SOLN
0.7500 mg | INTRAMUSCULAR | Status: DC | PRN
  Administered 2021-07-21 – 2021-07-22 (×6): 0.75 mg via INTRAVENOUS
  Filled 2021-07-21 (×6): qty 1

## 2021-07-21 MED ORDER — BENEFIBER PO POWD
1.0000 | Freq: Every day | ORAL | Status: DC
  Administered 2021-07-21 – 2021-07-24 (×4): 1 via ORAL

## 2021-07-21 NOTE — Progress Notes (Signed)
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)   Other Anaphylaxis    ALLERGY TO HORSE SERUM   Penicillins Hives and Shortness Of Breath        Pentazocine Other (See Comments)    Other reaction(s): Mental Status Changes (intolerance) Altered Mental Status  Altered Mental Status  Altered Mental Status    Sulfa Antibiotics Hives and Shortness Of Breath   Trovan [Alatrofloxacin] Palpitations, Other (See Comments) and Anaphylaxis    Chest pain, dizziness, irregular pulse   Vitamin B12     Confusion and shaking    Zinc Gelatin [Zinc] Anaphylaxis   Amlodipine Swelling   Calcium Channel Blockers     Respiratory distress   Carvedilol Other (See Comments)    Dizziness, "joint pain, depression"   Clindamycin/Lincomycin     CP, lock jaw   Codeine Nausea Only   Cymbalta [Duloxetine Hcl] Swelling   Diovan [Valsartan] Swelling   Lisinopril Swelling   Omeprazole     Generic with additives    Sertraline Other (See Comments)    confusion   Tramadol Nausea Only   Loteprednol Etabonate Rash      Patient Measurements: Last Weight  Most recent update: 07/21/2021  4:49 AM    Weight  48.1 kg (106 lb 0.7 oz)            Body mass index is 18.2 kg/m. Angela Wong               Temp: 98 F (36.7 C) (07/08 0455) Temp Source: Oral (07/08 0455) BP: 142/88 (07/08 0455) Pulse Rate: 71 (07/08 0455)  Labs: Recent Labs    07/18/21 2252 07/19/21 0519 07/20/21 0522  07/21/21 0615  HGB 13.8  --  13.3  --   HCT 42.5  --  40.3  --   PLT 107*  --  103*  --   LABPROT 21.9* 20.7* 20.4* 19.7*  INR 1.9* 1.8* 1.8* 1.7*  CREATININE 0.65 0.60 0.69 0.62     Estimated Creatinine Clearance: 41.9 mL/min (by C-G formula based on SCr of 0.62 mg/dL).     Medications:  Medications Prior to Admission  Medication Sig Dispense Refill Last Dose   furosemide (LASIX) 40 MG tablet Take 1 tablet (40 mg total) by mouth daily. (Patient taking differently: Take 20-40 mg by mouth daily as needed for edema or fluid.) 90 tablet 1 07/18/2021   hydrocortisone-pramoxine (ANALPRAM-HC) 2.5-1 % rectal cream PLACE 1 APPLICATION RECTALLY SEE ADMINISTRATION INSTRUCTIONS, AFTER EACH BATHROOM USE (Patient taking differently: Place 1 Application rectally daily as needed for hemorrhoids or anal itching.) 60 g 0 Past Week   levothyroxine (EUTHYROX) 50 MCG tablet Take 2 tablets (100 mcg total) by mouth every morning. 180 tablet 3 07/18/2021   methocarbamol (ROBAXIN) 750 MG tablet Take 1 tablet (750 mg total) by mouth 4 (four) times daily. (Patient taking differently: Take 750 mg by mouth every 4 (four) hours as needed for muscle spasms.) 180 tablet 2 07/18/2021  at 0400   ondansetron (ZOFRAN) 4 MG tablet Take 1 tablet (4 mg total) by mouth every 8 (eight) hours as needed for nausea or vomiting. 60 tablet 0 UNK   oxyCODONE-acetaminophen (PERCOCET) 7.5-325 MG tablet Take 1 tablet by mouth every 4 (four) hours as needed for severe pain. 180 tablet 0 07/17/2021 at 0400   predniSONE (DELTASONE) 5 MG tablet Take 1 tablet (5 mg total) by mouth every morning. 90 tablet 0 07/17/2021   warfarin (COUMADIN) 1 MG tablet TAKE 4 TABLETS BY MOUTH ON MONDAY, WEDNESDAY AND FRIDAY. TAKE 2 TABLETS ON TUESDAY, THURSDAY, SATURDAY AND SUNDAY (Patient taking differently: Take 5 mg by mouth daily.) 90 tablet 0 07/17/2021 at 1630   nitroGLYCERIN (NITROSTAT) 0.3 MG SL tablet Place 0.3 mg under the tongue every 5 (five) minutes as needed  for chest pain.   UNK   Scheduled:   atenolol  25 mg Oral Daily   furosemide  30 mg Intravenous BID   levothyroxine  100 mcg Oral q morning   potassium chloride  40 mEq Oral BID   predniSONE  5 mg Oral q morning   Warfarin - Pharmacist Dosing Inpatient   Does not apply q1600   Infusions:  PRN: acetaminophen **OR** acetaminophen, hydrocortisone-pramoxine, HYDROmorphone (DILAUDID) injection, methocarbamol, ondansetron **OR** ondansetron (ZOFRAN) IV, mouth rinse, oxyCODONE-acetaminophen Anti-infectives (From admission, onward)    Start     Dose/Rate Route Frequency Ordered Stop   07/18/21 2315  vancomycin (VANCOCIN) IVPB 1000 mg/200 mL premix        1,000 mg 200 mL/hr over 60 Minutes Intravenous  Once 07/18/21 2309 07/19/21 0039       Goal of Therapy:  INR 2-3 Monitor platelets by anticoagulation protocol: Yes    Prior to Admission Warfarin Dosing:  Angela Wong takes '3mg'$  of warfarin Monday and Friday, '2mg'$  ROW      Admit INR was 1.9 Lab Results  Component Value Date   INR 1.7 (H) 07/21/2021   INR 1.8 (H) 07/20/2021   INR 1.8 (H) 07/19/2021    Assessment: Angela Wong a 81 y.o. female requires anticoagulation with warfarin for the indication of  atrial fibrillation. Warfarin will be initiated inpatient following pharmacy protocol per pharmacy consult. Patient most recent blood work is as follows:    Latest Ref Rng & Units 07/20/2021    5:22 AM 07/18/2021   10:52 PM 06/06/2021    5:06 PM  CBC  WBC 4.0 - 10.5 K/uL 5.1  5.4  10.4   Hemoglobin 12.0 - 15.0 g/dL 13.3  13.8  15.6   Hematocrit 36.0 - 46.0 % 40.3  42.5  47.3   Platelets 150 - 400 K/uL 103  107  129      Plan: Warfarin '3mg'$  po x 1  Monitor CBC MWF with am labs   Monitor INR daily Monitor for signs and symptoms of bleeding   Margot Ables, PharmD Clinical Pharmacist 07/21/2021 8:32 AM

## 2021-07-21 NOTE — Hospital Course (Signed)
81 y.o. female with medical history significant of atrial fibrillation, cerebral vasculitis, congestive dilated cardiomyopathy, COPD, fibromyalgia, GERD, hyperparathyroidism, hypothyroidism, Sjogren's, situational depression, sick sinus syndrome, Raynaud's, vitamin D deficiency, 27 medication allergies, and more presents to ED with a chief complaint of difficulty with ambulation.   She was admitted with acute HFrEF

## 2021-07-21 NOTE — Plan of Care (Signed)
  Problem: Education: Goal: Knowledge of General Education information will improve Description: Including pain rating scale, medication(s)/side effects and non-pharmacologic comfort measures Outcome: Progressing   Problem: Health Behavior/Discharge Planning: Goal: Ability to manage health-related needs will improve Outcome: Not Progressing   

## 2021-07-21 NOTE — Progress Notes (Signed)
Pt has required pain medication every 2 hours with minimal relief. Maybe a kpad would help? She confirms heat has helped her back pain in the past. She also reports dry, skin that is "falling off" around chest area, I have assessed dry skin but not to that extreme. Pt would like to discuss with you or someone with the medical team. Reports that she had chest pain "early this morning", however did not  report at that time, denies at this time. Vital signs stable this morning. Notification made via secure chat for followup as needed, day shift nurse made aware and included as well.

## 2021-07-21 NOTE — Progress Notes (Signed)
PROGRESS NOTE   Angela Wong  WGN:[id number] DOB: 1940/10/31 DOA: 07/18/2021 PCP: [name], FNP   Chief Complaint  Patient presents with   Leg Swelling   Level of care: Telemetry  Brief Admission History:  81 y.o. female with medical history significant of atrial fibrillation, cerebral vasculitis, congestive dilated cardiomyopathy, COPD, fibromyalgia, GERD, hyperparathyroidism, hypothyroidism, Sjogren's, situational depression, sick sinus syndrome, Raynaud's, vitamin D deficiency, 27 medication allergies, and more presents to ED with a chief complaint of difficulty with ambulation.   She was admitted with acute HFrEF   Assessment and Plan: * Acute HFrEF - With edema in bilateral lower extremities, venous stasis dermatitis, periorbital edema, and new oxygen requirement 2 L nasal cannula -Known dilated cardiomyopathy -BNP up to 1400 from 600s -40 mg IV Lasix given in the ED -Continue 30 mg IV Lasix twice daily -Echo in the a.m. -Unfortunately Echo 7/6 with findings of further reduced EF down to 20-25% which likely explains fluid gain and increased fatigue and somnolence  -pt refusing to take beta-blocker -Continue to monitor -Pt could not tolerate ReDS vest reading so it was abandoned -palliative care consultation requested    Intake/Output Summary (Last 24 hours) at 07/21/2021 1314 Last data filed at 07/21/2021 0456 Gross per 24 hour  Intake 240 ml  Output 400 ml  Net -160 ml   Filed Weights   07/19/21 0106 07/20/21 0516 07/21/21 0449  Weight: 51.9 kg 54.4 kg 48.1 kg     Protein-calorie malnutrition, severe -- consulted to dietitian   Hypokalemia - continue to supplement potassium while on IV lasix   Acute respiratory failure with hypoxia ([place]) - Most likely secondary to acute heart failure exacerbation -wean oxygen as tolerated  -Chest x-ray shows stable small bilateral pleural effusions and bibasilar atelectasis -Continue treatment for CHF -Wean off O2 as  tolerated  Chronic pain - Continue Percocet and Robaxin - pain has been difficult to control, I have requested palliative consult as patient seems to want to focus more on pain relief and comfort versus continuing full scope acute care, she may elect for full comfort measures  Edema of both lower extremities - With erythema of both lower extremities nearly resolved with diuresis -bilateral venous stasis dermatitis -No leukocytosis, afebrile -antibiotics discontinued, cellulitis ruled out -Continue to monitor -improving with IV lasix diuresis   Hypothyroidism - TSH 3.94 7 -Continue Synthroid  Atrial fibrillation, permanent (HCC) Pt refusing to take beta-blocker Continue warfarin per pharm D Follow INR Goal between 2-3 Continue to monitor  DVT prophylaxis: warfarin  Code Status: Full  Family Communication: discussed with son telephone 7/7 Disposition: Status is: Inpatient Remains inpatient appropriate because: intensity, requiring IV lasix    Consultants:  Palliative care  Procedures:   Antimicrobials:    Subjective: Pt reports uncontrolled pain but improving edema in legs, neck pain and still refusing atenolol Objective: Vitals:   07/20/21 1437 07/20/21 2019 07/21/21 0449 07/21/21 0455  BP: (!) 143/91 123/73  (!) 142/88  Pulse: 68 (!) 59  71  Resp: '18 20  18  '$ Temp: 98.3 F (36.8 C) 98.2 F (36.8 C)  98 F (36.7 C)  TempSrc: Oral Oral  Oral  SpO2: 97% 97%  97%  Weight:   48.1 kg   Height:        Intake/Output Summary (Last 24 hours) at 07/21/2021 1320 Last data filed at 07/21/2021 0456 Gross per 24 hour  Intake 240 ml  Output 400 ml  Net -160 ml   [employer]  07/19/21 0106 07/20/21 0516 07/21/21 0449  Weight: 51.9 kg 54.4 kg 48.1 kg   Examination:  General exam: Appears calm and comfortable  Respiratory system: crackles left base. Respiratory effort normal. Cardiovascular system: normal S1 & S2 heard. No JVD, murmurs, rubs, gallops or clicks. trace  pedal edema bilateral. Gastrointestinal system: Abdomen is nondistended, soft and nontender. No organomegaly or masses felt. Normal bowel sounds heard. Central nervous system: Alert and oriented. No focal neurological deficits. Extremities: Symmetric 5 x 5 power. Skin: No rashes, lesions or ulcers. Psychiatry: Judgement and insight appear normal. Mood & affect appropriate.   Data Reviewed: I have personally reviewed following labs and imaging studies  CBC: Recent Labs  Lab 07/18/21 2252 07/20/21 0522  WBC 5.4 5.1  HGB 13.8 13.3  HCT 42.5 40.3  MCV 98.8 99.3  PLT 107* 103*    Basic Metabolic Panel: Recent Labs  Lab 07/18/21 2252 07/19/21 0519 07/20/21 0522 07/21/21 0615  NA 137 136 134* 134*  K 3.4* 3.3* 3.4* 3.3*  CL 104 102 97* 96*  CO2 '26 28 30 31  '$ GLUCOSE 85 79 96 92  BUN '19 18 16 19  '$ CREATININE 0.65 0.60 0.69 0.62  CALCIUM 8.6* 8.6* 8.4* 8.3*  MG 2.0 2.0 1.7 1.9    CBG: No results for input(s): "GLUCAP" in the last 168 hours.  Recent Results (from the past 240 hour(s))  SARS Coronavirus 2 by RT PCR (hospital order, performed in [facility] hospital lab) *cepheid single result test* Anterior Nasal Swab     Status: None   Collection Time: 07/19/21 12:25 AM   Specimen: Anterior Nasal Swab  Result Value Ref Range Status   SARS Coronavirus 2 by RT PCR NEGATIVE NEGATIVE Final    Comment: (NOTE) SARS-CoV-2 target nucleic acids are NOT DETECTED.  The SARS-CoV-2 RNA is generally detectable in upper and lower respiratory specimens during the acute phase of infection. The lowest concentration of SARS-CoV-2 viral copies this assay can detect is 250 copies / mL. A negative result does not preclude SARS-CoV-2 infection and should not be used as the sole basis for treatment or other patient management decisions.  A negative result may occur with improper specimen collection / handling, submission of specimen other than nasopharyngeal swab, presence of viral mutation(s)  within the areas targeted by this assay, and inadequate number of viral copies (<250 copies / mL). A negative result must be combined with clinical observations, patient history, and epidemiological information.  Fact Sheet for Patients:   https://www.patel.info/  Fact Sheet for Healthcare Providers: https://hall.com/  This test is not yet approved or  cleared by the [place] FDA and has been authorized for detection and/or diagnosis of SARS-CoV-2 by FDA under an Emergency Use Authorization (EUA).  This EUA will remain in effect (meaning this test can be used) for the duration of the COVID-19 declaration under Section 564(b)(1) of the Act, 21 U.S.C. section 360bbb-3(b)(1), unless the authorization is terminated or revoked sooner.  Performed at [facility], [address], [place], [place] [address]      Radiology Studies: No results found.  Scheduled Meds:  furosemide  30 mg Intravenous BID   levothyroxine  100 mcg Oral q morning   potassium chloride  40 mEq Oral BID   predniSONE  5 mg Oral q morning   warfarin  3 mg Oral ONCE-1600   Warfarin - Pharmacist Dosing Inpatient   Does not apply q1600   Continuous Infusions:   LOS: 3 days   Time  spent: 35 mins  Irwin Brakeman, MD How to contact the [facility] Attending or Consulting provider [place] or covering provider during after hours [place], for this patient?  Check the care team in [facility] and look for a) attending/consulting TRH provider listed and b) the [facility] team listed Log into [email or web address] and use [place] universal password to access. If you do not have the password, please contact the hospital operator. Locate the [facility] provider you are looking for under [facility] and page to a number that you can be directly reached. If you still have difficulty reaching the provider, please page the [facility] (Director on Call) for the Hospitalists listed on amion for  assistance.  07/21/2021, 1:20 PM

## 2021-07-22 DIAGNOSIS — J9601 Acute respiratory failure with hypoxia: Secondary | ICD-10-CM | POA: Diagnosis not present

## 2021-07-22 DIAGNOSIS — I5041 Acute combined systolic (congestive) and diastolic (congestive) heart failure: Secondary | ICD-10-CM | POA: Diagnosis not present

## 2021-07-22 DIAGNOSIS — G8929 Other chronic pain: Secondary | ICD-10-CM | POA: Diagnosis not present

## 2021-07-22 DIAGNOSIS — I4821 Permanent atrial fibrillation: Secondary | ICD-10-CM | POA: Diagnosis not present

## 2021-07-22 LAB — PROTIME-INR
INR: 2 — ABNORMAL HIGH (ref 0.8–1.2)
Prothrombin Time: 22.9 seconds — ABNORMAL HIGH (ref 11.4–15.2)

## 2021-07-22 LAB — BASIC METABOLIC PANEL
Anion gap: 5 (ref 5–15)
BUN: 23 mg/dL (ref 8–23)
CO2: 31 mmol/L (ref 22–32)
Calcium: 8.1 mg/dL — ABNORMAL LOW (ref 8.9–10.3)
Chloride: 99 mmol/L (ref 98–111)
Creatinine, Ser: 0.73 mg/dL (ref 0.44–1.00)
GFR, Estimated: >60 mL/min (ref >60)
Glucose, Bld: 83 mg/dL (ref 70–99)
Potassium: 3.9 mmol/L (ref 3.5–5.1)
Sodium: 135 mmol/L (ref 135–145)

## 2021-07-22 MED ORDER — WARFARIN SODIUM 2 MG PO TABS
2.0000 mg | ORAL_TABLET | Freq: Once | ORAL | Status: AC
  Administered 2021-07-22: 2 mg via ORAL
  Filled 2021-07-22: qty 1

## 2021-07-22 MED ORDER — HYDROMORPHONE HCL 1 MG/ML IJ SOLN
1.0000 mg | INTRAMUSCULAR | Status: DC | PRN
  Administered 2021-07-22 – 2021-07-24 (×12): 1 mg via INTRAVENOUS
  Filled 2021-07-22 (×13): qty 1

## 2021-07-22 MED ORDER — POTASSIUM CHLORIDE 20 MEQ PO PACK
20.0000 meq | PACK | Freq: Two times a day (BID) | ORAL | Status: DC
  Administered 2021-07-22 – 2021-07-23 (×3): 20 meq via ORAL
  Filled 2021-07-22 (×3): qty 1

## 2021-07-22 NOTE — Plan of Care (Signed)
  Problem: Education: Goal: Knowledge of General Education information will improve Description Including pain rating scale, medication(s)/side effects and non-pharmacologic comfort measures Outcome: Progressing   Problem: Health Behavior/Discharge Planning: Goal: Ability to manage health-related needs will improve Outcome: Progressing   

## 2021-07-22 NOTE — Plan of Care (Signed)
Pt alert and oriented x 4. Pt continues to complain of pain. Pt receiving dilaudid every 3-4 hours and Percocet/robaxin approx every 4 hours.  Problem: Nutrition: Goal: Adequate nutrition will be maintained Outcome: Not Progressing   Problem: Pain Managment: Goal: General experience of comfort will improve Outcome: Not Progressing   Problem: Education: Goal: Knowledge of General Education information will improve Description: Including pain rating scale, medication(s)/side effects and non-pharmacologic comfort measures Outcome: Progressing   Problem: Health Behavior/Discharge Planning: Goal: Ability to manage health-related needs will improve Outcome: Progressing   Problem: Clinical Measurements: Goal: Ability to maintain clinical measurements within normal limits will improve Outcome: Progressing Goal: Will remain free from infection Outcome: Progressing Goal: Diagnostic test results will improve Outcome: Progressing Goal: Respiratory complications will improve Outcome: Progressing Goal: Cardiovascular complication will be avoided Outcome: Progressing

## 2021-07-22 NOTE — Progress Notes (Addendum)
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)   Other Anaphylaxis    ALLERGY TO HORSE SERUM   Penicillins Hives and Shortness Of Breath        Pentazocine Other (See Comments)    Other reaction(s): Mental Status Changes (intolerance) Altered Mental Status  Altered Mental Status  Altered Mental Status    Sulfa Antibiotics Hives and Shortness Of Breath   Trovan [Alatrofloxacin] Palpitations, Other (See Comments) and Anaphylaxis    Chest pain, dizziness, irregular pulse   Vitamin B12     Confusion and shaking    Zinc Gelatin [Zinc] Anaphylaxis   Amlodipine Swelling   Calcium Channel Blockers     Respiratory distress   Carvedilol Other (See Comments)    Dizziness, "joint pain, depression"   Clindamycin/Lincomycin     CP, lock jaw   Codeine Nausea Only   Cymbalta [Duloxetine Hcl] Swelling   Diovan [Valsartan] Swelling   Lisinopril Swelling   Omeprazole     Generic with additives    Sertraline Other (See Comments)    confusion   Tramadol Nausea Only   Loteprednol Etabonate Rash      Patient Measurements: Last Weight  Most recent update: 07/22/2021  6:39 AM    Weight  47.9 kg (105 lb 9.6 oz)            Body mass index is 18.13 kg/m. Angela Wong               Temp: 97.5 F (36.4 C) (07/09 0631) Temp Source: Oral (07/09 0631) BP: 103/65 (07/09 0631) Pulse Rate: 88 (07/09 0631)  Labs: Recent Labs    07/20/21 0522 07/21/21 0615 07/22/21 0700   HGB 13.3  --   --   HCT 40.3  --   --   PLT 103*  --   --   LABPROT 20.4* 19.7* 22.9*  INR 1.8* 1.7* 2.0*  CREATININE 0.69 0.62 0.73     Estimated Creatinine Clearance: 41.7 mL/min (by C-G formula based on SCr of 0.73 mg/dL).     Medications:  Medications Prior to Admission  Medication Sig Dispense Refill Last Dose   furosemide (LASIX) 40 MG tablet Take 1 tablet (40 mg total) by mouth daily. (Patient taking differently: Take 20-40 mg by mouth daily as needed for edema or fluid.) 90 tablet 1 07/18/2021   hydrocortisone-pramoxine (ANALPRAM-HC) 2.5-1 % rectal cream PLACE 1 APPLICATION RECTALLY SEE ADMINISTRATION INSTRUCTIONS, AFTER EACH BATHROOM USE (Patient taking differently: Place 1 Application rectally daily as needed for hemorrhoids or anal itching.) 60 g 0 Past Week   levothyroxine (EUTHYROX) 50 MCG tablet Take 2 tablets (100 mcg total) by mouth every morning. 180 tablet 3 07/18/2021   methocarbamol (ROBAXIN) 750 MG tablet Take 1 tablet (750 mg total) by mouth 4 (four) times daily. (Patient taking differently: Take 750 mg by mouth every 4 (four) hours as needed for muscle spasms.) 180 tablet 2 07/18/2021 at 0400   ondansetron (ZOFRAN) 4 MG  tablet Take 1 tablet (4 mg total) by mouth every 8 (eight) hours as needed for nausea or vomiting. 60 tablet 0 UNK   oxyCODONE-acetaminophen (PERCOCET) 7.5-325 MG tablet Take 1 tablet by mouth every 4 (four) hours as needed for severe pain. 180 tablet 0 07/17/2021 at 0400   predniSONE (DELTASONE) 5 MG tablet Take 1 tablet (5 mg total) by mouth every morning. 90 tablet 0 07/17/2021   warfarin (COUMADIN) 1 MG tablet TAKE 4 TABLETS BY MOUTH ON MONDAY, WEDNESDAY AND FRIDAY. TAKE 2 TABLETS ON TUESDAY, THURSDAY, SATURDAY AND SUNDAY (Patient taking differently: Take 5 mg by mouth daily.) 90 tablet 0 07/17/2021 at 1630   nitroGLYCERIN (NITROSTAT) 0.3 MG SL tablet Place 0.3 mg under the tongue every 5 (five) minutes as needed for chest pain.   UNK   Scheduled:    Benefiber  1 Dose Oral Daily   furosemide  30 mg Intravenous BID   levothyroxine  100 mcg Oral q morning   potassium chloride  40 mEq Oral BID   predniSONE  5 mg Oral q morning   Warfarin - Pharmacist Dosing Inpatient   Does not apply q1600   Infusions:  PRN: acetaminophen **OR** acetaminophen, hydrocortisone-pramoxine, HYDROmorphone (DILAUDID) injection, methocarbamol, ondansetron **OR** ondansetron (ZOFRAN) IV, mouth rinse, oxyCODONE-acetaminophen Anti-infectives (From admission, onward)    Start     Dose/Rate Route Frequency Ordered Stop   07/18/21 2315  vancomycin (VANCOCIN) IVPB 1000 mg/200 mL premix        1,000 mg 200 mL/hr over 60 Minutes Intravenous  Once 07/18/21 2309 07/19/21 0039       Goal of Therapy:  INR 2-3 Monitor platelets by anticoagulation protocol: Yes    Prior to Admission Warfarin Dosing:  Angela Wong takes '3mg'$  of warfarin Monday and Friday, '2mg'$  ROW      INR 2.0  Lab Results  Component Value Date   INR 2.0 (H) 07/22/2021   INR 1.7 (H) 07/21/2021   INR 1.8 (H) 07/20/2021    Assessment: Angela Wong a 81 y.o. female requires anticoagulation with warfarin for the indication of  atrial fibrillation. Warfarin will be initiated inpatient following pharmacy protocol per pharmacy consult. Patient most recent blood work is as follows:    Latest Ref Rng & Units 07/20/2021    5:22 AM 07/18/2021   10:52 PM 06/06/2021    5:06 PM  CBC  WBC 4.0 - 10.5 K/uL 5.1  5.4  10.4   Hemoglobin 12.0 - 15.0 g/dL 13.3  13.8  15.6   Hematocrit 36.0 - 46.0 % 40.3  42.5  47.3   Platelets 150 - 400 K/uL 103  107  129      Plan: Warfarin 2 mg po x 1  Monitor CBC MWF with am labs   Monitor INR daily Monitor for signs and symptoms of bleeding   Margot Ables, PharmD Clinical Pharmacist 07/22/2021 9:15 AM

## 2021-07-22 NOTE — Progress Notes (Signed)
Pt reported last dose of dilaudid didn't help. When checked pt. Pt was in bed eyes closed respirations even unlabored. Pt stated "I didn't feel last dose. What did you give me?" Pt informed that I gave her 0.75 mg of dilaudid. Pt questioning due to one syringe was open when entering room. Pt informed medication had to be wasted. Other vial seal broke in room. Pt sitter/family stating pt didn't get drowsy like she usually does. Pt questioned if IV was bad. Informed that if IV infiltrated it would be swollen for the amount of of NS flushed before and after. Charge Nurse made aware. After pain meds given patient already asking for when next pain med can be given.

## 2021-07-22 NOTE — Progress Notes (Signed)
PROGRESS NOTE   Angela Wong  PJA:[id number] DOB: 1940-12-09 DOA: 07/18/2021 PCP: [name], FNP   Chief Complaint  Patient presents with   Leg Swelling   Level of care: Telemetry  Brief Admission History:  81 y.o. female with medical history significant of atrial fibrillation, cerebral vasculitis, congestive dilated cardiomyopathy, COPD, fibromyalgia, GERD, hyperparathyroidism, hypothyroidism, Sjogren's, situational depression, sick sinus syndrome, Raynaud's, vitamin D deficiency, 27 medication allergies, and more presents to ED with a chief complaint of difficulty with ambulation.   She was admitted with acute HFrEF   Assessment and Plan: * Acute HFrEF - With edema in bilateral lower extremities, venous stasis dermatitis, periorbital edema, and new oxygen requirement 2 L nasal cannula -Known dilated cardiomyopathy -BNP up to 1400 from 600s -40 mg IV Lasix given in the ED -Continue 30 mg IV Lasix twice daily -Echo in the a.m. -Unfortunately Echo 7/6 with findings of further reduced EF down to 20-25% which likely explains fluid gain and increased fatigue and somnolence  -pt refusing to take beta-blocker -Continue to monitor -Pt could not tolerate ReDS vest reading so it was abandoned -palliative care consultation requested    Intake/Output Summary (Last 24 hours) at 07/22/2021 1309 Last data filed at 07/21/2021 1700 Gross per 24 hour  Intake --  Output 400 ml  Net -400 ml   Filed Weights   07/20/21 0516 07/21/21 0449 07/22/21 0500  Weight: 54.4 kg 48.1 kg 47.9 kg     Protein-calorie malnutrition, severe -- consulted to dietitian   Hypokalemia - continue to supplement potassium while on IV lasix   Acute respiratory failure with hypoxia ([place]) - Most likely secondary to acute heart failure exacerbation -wean oxygen as tolerated  -Chest x-ray shows stable small bilateral pleural effusions and bibasilar atelectasis -Continue treatment for CHF -Wean off O2 as  tolerated  Chronic pain - Continue Percocet and Robaxin - pain has been difficult to control, I have requested palliative consult as patient seems to want to focus more on pain relief and comfort versus continuing full scope acute care, she may elect for full comfort measures  Edema of both lower extremities - With erythema of both lower extremities nearly resolved with diuresis -bilateral venous stasis dermatitis -No leukocytosis, afebrile -antibiotics discontinued, cellulitis ruled out -Continue to monitor -improving with IV lasix diuresis   Hypothyroidism - TSH 3.94 7 -Continue Synthroid  Atrial fibrillation, permanent (HCC) Pt refusing to take beta-blocker Continue warfarin per pharm D Follow INR Goal between 2-3 Continue to monitor  DVT prophylaxis: warfarin  Code Status: Full  Family Communication: discussed with son telephone 7/7 Disposition: Status is: Inpatient Remains inpatient appropriate because: intensity, requiring IV lasix    Consultants:  Palliative care  cardiology Procedures:   Antimicrobials:    Subjective: Pt reports pain medication is not helping enough to control the pain, agreeable to goals of care meeting Objective: Vitals:   07/21/21 0455 07/21/21 2222 07/22/21 0500 07/22/21 0631  BP: (!) 142/88 111/65  103/65  Pulse: 71 63  88  Resp: 18 20    Temp: 98 F (36.7 C) 97.8 F (36.6 C)  (!) 97.5 F (36.4 C)  TempSrc: Oral Oral  Oral  SpO2: 97% 100%  98%  Weight:   47.9 kg   Height:        Intake/Output Summary (Last 24 hours) at 07/22/2021 1310 Last data filed at 07/21/2021 1700 Gross per 24 hour  Intake --  Output 400 ml  Net -400 ml   Angela Wong  Weights   07/20/21 0516 07/21/21 0449 07/22/21 0500  Weight: 54.4 kg 48.1 kg 47.9 kg   Examination:  General exam: Appears calm and comfortable  Respiratory system: crackles left base. Respiratory effort normal. Cardiovascular system: normal S1 & S2 heard. No JVD, murmurs, rubs, gallops or  clicks. trace pedal edema bilateral. Gastrointestinal system: Abdomen is nondistended, soft and nontender. No organomegaly or masses felt. Normal bowel sounds heard. Central nervous system: Alert and oriented. No focal neurological deficits. Extremities: Symmetric 5 x 5 power. Skin: No rashes, lesions or ulcers. Psychiatry: Judgement and insight appear normal. Mood & affect appropriate.   Data Reviewed: I have personally reviewed following labs and imaging studies  CBC: Recent Labs  Lab 07/18/21 2252 07/20/21 0522  WBC 5.4 5.1  HGB 13.8 13.3  HCT 42.5 40.3  MCV 98.8 99.3  PLT 107* 103*    Basic Metabolic Panel: Recent Labs  Lab 07/18/21 2252 07/19/21 0519 07/20/21 0522 07/21/21 0615 07/22/21 0700  NA 137 136 134* 134* 135  K 3.4* 3.3* 3.4* 3.3* 3.9  CL 104 102 97* 96* 99  CO2 '26 28 30 31 31  '$ GLUCOSE 85 79 96 92 83  BUN '19 18 16 19 23  '$ CREATININE 0.65 0.60 0.69 0.62 0.73  CALCIUM 8.6* 8.6* 8.4* 8.3* 8.1*  MG 2.0 2.0 1.7 1.9  --     CBG: No results for input(s): "GLUCAP" in the last 168 hours.  Recent Results (from the past 240 hour(s))  SARS Coronavirus 2 by RT PCR (hospital order, performed in [facility] hospital lab) *cepheid single result test* Anterior Nasal Swab     Status: None   Collection Time: 07/19/21 12:25 AM   Specimen: Anterior Nasal Swab  Result Value Ref Range Status   SARS Coronavirus 2 by RT PCR NEGATIVE NEGATIVE Final    Comment: (NOTE) SARS-CoV-2 target nucleic acids are NOT DETECTED.  The SARS-CoV-2 RNA is generally detectable in upper and lower respiratory specimens during the acute phase of infection. The lowest concentration of SARS-CoV-2 viral copies this assay can detect is 250 copies / mL. A negative result does not preclude SARS-CoV-2 infection and should not be used as the sole basis for treatment or other patient management decisions.  A negative result may occur with improper specimen collection / handling, submission of specimen  other than nasopharyngeal swab, presence of viral mutation(s) within the areas targeted by this assay, and inadequate number of viral copies (<250 copies / mL). A negative result must be combined with clinical observations, patient history, and epidemiological information.  Fact Sheet for Patients:   https://www.patel.info/  Fact Sheet for Healthcare Providers: https://hall.com/  This test is not yet approved or  cleared by the [place] FDA and has been authorized for detection and/or diagnosis of SARS-CoV-2 by FDA under an Emergency Use Authorization (EUA).  This EUA will remain in effect (meaning this test can be used) for the duration of the COVID-19 declaration under Section 564(b)(1) of the Act, 21 U.S.C. section 360bbb-3(b)(1), unless the authorization is terminated or revoked sooner.  Performed at [facility], [address] [place], [place] [address]      Radiology Studies: No results found.  Scheduled Meds:  Benefiber  1 Dose Oral Daily   furosemide  30 mg Intravenous BID   levothyroxine  100 mcg Oral q morning   potassium chloride  20 mEq Oral BID   predniSONE  5 mg Oral q morning   warfarin  2 mg Oral ONCE-1600  Warfarin - Pharmacist Dosing Inpatient   Does not apply q1600   Continuous Infusions:   LOS: 4 days   Time spent: 35 mins  Tytionna Cloyd Wynetta Emery, MD How to contact the [facility] Attending or Consulting provider [place] or covering provider during after hours [place], for this patient?  Check the care team in [facility] and look for a) attending/consulting [facility] provider listed and b) the [facility] team listed Log into [email or web address] and use [place]'s universal password to access. If you do not have the password, please contact the hospital operator. Locate the [facility] provider you are looking for under [facility] and page to a number that you can be directly reached. If you still have difficulty reaching the provider,  please page the [facility] (Director on Call) for the Hospitalists listed on amion for assistance.  07/22/2021, 1:10 PM

## 2021-07-23 ENCOUNTER — Ambulatory Visit: Payer: Self-pay | Admitting: *Deleted

## 2021-07-23 ENCOUNTER — Encounter (HOSPITAL_COMMUNITY): Payer: Self-pay | Admitting: Family Medicine

## 2021-07-23 DIAGNOSIS — L03119 Cellulitis of unspecified part of limb: Secondary | ICD-10-CM | POA: Diagnosis not present

## 2021-07-23 DIAGNOSIS — I4821 Permanent atrial fibrillation: Secondary | ICD-10-CM | POA: Diagnosis not present

## 2021-07-23 DIAGNOSIS — J9601 Acute respiratory failure with hypoxia: Secondary | ICD-10-CM | POA: Diagnosis not present

## 2021-07-23 DIAGNOSIS — E039 Hypothyroidism, unspecified: Secondary | ICD-10-CM

## 2021-07-23 DIAGNOSIS — Z7189 Other specified counseling: Secondary | ICD-10-CM

## 2021-07-23 DIAGNOSIS — G8929 Other chronic pain: Secondary | ICD-10-CM | POA: Diagnosis not present

## 2021-07-23 DIAGNOSIS — Z515 Encounter for palliative care: Secondary | ICD-10-CM | POA: Diagnosis not present

## 2021-07-23 DIAGNOSIS — I5023 Acute on chronic systolic (congestive) heart failure: Secondary | ICD-10-CM

## 2021-07-23 DIAGNOSIS — I5041 Acute combined systolic (congestive) and diastolic (congestive) heart failure: Secondary | ICD-10-CM | POA: Diagnosis not present

## 2021-07-23 DIAGNOSIS — E43 Unspecified severe protein-calorie malnutrition: Secondary | ICD-10-CM | POA: Diagnosis not present

## 2021-07-23 LAB — BRAIN NATRIURETIC PEPTIDE: B Natriuretic Peptide: 980 pg/mL — ABNORMAL HIGH (ref 0.0–100.0)

## 2021-07-23 LAB — CBC
HCT: 41.3 % (ref 36.0–46.0)
Hemoglobin: 13.5 g/dL (ref 12.0–15.0)
MCH: 32.4 pg (ref 26.0–34.0)
MCHC: 32.7 g/dL (ref 30.0–36.0)
MCV: 99 fL (ref 80.0–100.0)
Platelets: 122 10*3/uL — ABNORMAL LOW (ref 150–400)
RBC: 4.17 MIL/uL (ref 3.87–5.11)
RDW: 13.4 % (ref 11.5–15.5)
WBC: 5.6 10*3/uL (ref 4.0–10.5)
nRBC: 0 % (ref 0.0–0.2)

## 2021-07-23 LAB — BASIC METABOLIC PANEL
Anion gap: 5 (ref 5–15)
BUN: 20 mg/dL (ref 8–23)
CO2: 30 mmol/L (ref 22–32)
Calcium: 8.1 mg/dL — ABNORMAL LOW (ref 8.9–10.3)
Chloride: 100 mmol/L (ref 98–111)
Creatinine, Ser: 0.62 mg/dL (ref 0.44–1.00)
GFR, Estimated: >60 mL/min (ref >60)
Glucose, Bld: 84 mg/dL (ref 70–99)
Potassium: 3.8 mmol/L (ref 3.5–5.1)
Sodium: 135 mmol/L (ref 135–145)

## 2021-07-23 LAB — PROTIME-INR
INR: 1.9 — ABNORMAL HIGH (ref 0.8–1.2)
Prothrombin Time: 21.5 seconds — ABNORMAL HIGH (ref 11.4–15.2)

## 2021-07-23 MED ORDER — MILK AND MOLASSES ENEMA
1.0000 | Freq: Once | RECTAL | Status: AC
  Administered 2021-07-23: 240 mL via RECTAL

## 2021-07-23 MED ORDER — WARFARIN SODIUM 2 MG PO TABS
3.0000 mg | ORAL_TABLET | Freq: Once | ORAL | Status: AC
  Administered 2021-07-23: 3 mg via ORAL
  Filled 2021-07-23: qty 1

## 2021-07-23 MED ORDER — GLYCERIN (LAXATIVE) 2 G RE SUPP: 1.0000 | Freq: Every day | RECTAL | Status: DC | PRN
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
## 2021-07-23 NOTE — Progress Notes (Signed)
Physical Therapy Treatment Patient Details Name: LIS SAVITT MRN: [id number] DOB: [birth year] Today's Date: 07/23/2021   History of Present Illness SHERRYL VALIDO is a 81 y.o. female with medical history significant of atrial fibrillation, cerebral vasculitis, congestive dilated cardiomyopathy, COPD, fibromyalgia, GERD, hyperparathyroidism, hypothyroidism, Sjogren's, situational depression, sick sinus syndrome, Raynaud's, vitamin D deficiency, 27 medication allergies, and more presents to ED with a chief complaint of difficulty with ambulation.    PT Comments    Patient received pain medication prior to therapy and apprehensive due to severe pain in legs with movement.  Patient demonstrates slow labored movement for sitting up at bedside with difficulty moving BLE due to pain/weakness, once seated at bedside able to maintain sitting balance, required encouragement, tolerated completing sit to stand and taking a few slow labored side steps to transfer to chair.  Patient tolerated sitting up for over an hour before requesting to go back to bed.  Patient will benefit from continued skilled physical therapy in hospital and recommended venue below to increase strength, balance, endurance for safe ADLs and gait.     Recommendations for follow up therapy are one component of a multi-disciplinary discharge planning process, led by the attending physician.  Recommendations may be updated based on patient status, additional functional criteria and insurance authorization.  Follow Up Recommendations  Skilled nursing-short term rehab (<3 hours/day) Can patient physically be transported by private vehicle: No   Assistance Recommended at Discharge Intermittent Supervision/Assistance  Patient can return home with the following A lot of help with walking and/or transfers;A lot of help with bathing/dressing/bathroom;Assistance with cooking/housework;Assist for transportation;Help with stairs or ramp for  entrance   Equipment Recommendations  None recommended by PT    Recommendations for Other Services       Precautions / Restrictions Precautions Precautions: Fall Restrictions Weight Bearing Restrictions: No     Mobility  Bed Mobility Overal bed mobility: Needs Assistance Bed Mobility: Supine to Sit     Supine to sit: Max assist     General bed mobility comments: slow labored movement with c/o severe pain BLE    Transfers Overall transfer level: Needs assistance Equipment used: Rolling walker (2 wheels) Transfers: Sit to/from Stand, Bed to chair/wheelchair/BSC Sit to Stand: Mod assist   Step pivot transfers: Mod assist, Max assist       General transfer comment: increased time, frequent buckling of knees due to pain/weakness    Ambulation/Gait Ambulation/Gait assistance: Max assist Gait Distance (Feet): 4 Feet Assistive device: Rolling walker (2 wheels) Gait Pattern/deviations: Decreased step length - right, Decreased step length - left, Decreased stride length Gait velocity: slow     General Gait Details: limited to a few slow labored unsteady side steps with frequent buckling of knees due to weakness   Stairs             Wheelchair Mobility    Modified Rankin (Stroke Patients Only)       Balance Overall balance assessment: Needs assistance Sitting-balance support: Feet supported, No upper extremity supported Sitting balance-Leahy Scale: Fair Sitting balance - Comments: seated at EOB   Standing balance support: Reliant on assistive device for balance, During functional activity, Bilateral upper extremity supported Standing balance-Leahy Scale: Poor Standing balance comment: using RW                            Cognition Arousal/Alertness: Awake/alert Behavior During Therapy: WFL for tasks assessed/performed Overall Cognitive Status: Within Functional Limits  for tasks assessed                                           Exercises      General Comments        Pertinent Vitals/Pain Pain Assessment Pain Assessment: Faces Faces Pain Scale: Hurts whole lot Pain Location: BLE left worse than right Pain Descriptors / Indicators: Grimacing, Guarding, Sore Pain Intervention(s): Limited activity within patient's tolerance, Monitored during session, Premedicated before session, Repositioned    Home Living                          Prior Function            PT Goals (current goals can now be found in the care plan section) Acute Rehab PT Goals Patient Stated Goal: return home with caregivers to assist PT Goal Formulation: With patient Time For Goal Achievement: 08/03/21 Potential to Achieve Goals: Fair Progress towards PT goals: Progressing toward goals    Frequency    Min 3X/week      PT Plan Current plan remains appropriate    Co-evaluation              AM-PAC PT "6 Clicks" Mobility   Outcome Measure  Help needed turning from your back to your side while in a flat bed without using bedrails?: A Lot Help needed moving from lying on your back to sitting on the side of a flat bed without using bedrails?: A Lot Help needed moving to and from a bed to a chair (including a wheelchair)?: A Lot Help needed standing up from a chair using your arms (e.g., wheelchair or bedside chair)?: A Lot Help needed to walk in hospital room?: A Lot Help needed climbing 3-5 steps with a railing? : Total 6 Click Score: 11    End of Session Equipment Utilized During Treatment: Oxygen Activity Tolerance: Patient tolerated treatment well;Patient limited by fatigue Patient left: in chair;with call bell/phone within reach Nurse Communication: Mobility status PT Visit Diagnosis: Other abnormalities of gait and mobility (R26.89);Muscle weakness (generalized) (M62.81);Unsteadiness on feet (R26.81)     Time: 4401-0272 PT Time Calculation (min) (ACUTE ONLY): 19 min  Charges:  $Therapeutic  Activity: 8-22 mins                     1:57 PM, 07/23/21 Lonell Grandchild, MPT Physical Therapist with [facility] [phone] office [phone] mobile phone

## 2021-07-23 NOTE — Progress Notes (Signed)
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)   Other Anaphylaxis    ALLERGY TO HORSE SERUM   Penicillins Hives and Shortness Of Breath        Pentazocine Other (See Comments)    Other reaction(s): Mental Status Changes (intolerance) Altered Mental Status  Altered Mental Status  Altered Mental Status    Sulfa Antibiotics Hives and Shortness Of Breath   Trovan [Alatrofloxacin] Palpitations, Other (See Comments) and Anaphylaxis    Chest pain, dizziness, irregular pulse   Vitamin B12     Confusion and shaking    Zinc Gelatin [Zinc] Anaphylaxis   Amlodipine Swelling   Calcium Channel Blockers     Respiratory distress   Carvedilol Other (See Comments)    Dizziness, "joint pain, depression"   Clindamycin/Lincomycin     CP, lock jaw   Codeine Nausea Only   Cymbalta [Duloxetine Hcl] Swelling   Diovan [Valsartan] Swelling   Lisinopril Swelling   Omeprazole     Generic with additives    Sertraline Other (See Comments)    confusion   Tramadol Nausea Only   Loteprednol Etabonate Rash      Patient Measurements: Last Weight  Most recent update: 07/23/2021  5:51 AM    Weight  48.2 kg (106 lb 4.2 oz)            Body mass index is 18.24 kg/m. Angela Wong               Temp: 97.9 F (36.6 C) (07/10 0539) Temp Source: Oral (07/10 0539) BP: 117/69 (07/10 0539) Pulse Rate: 60 (07/10 0539)  Labs: Recent Labs    07/21/21 0615 07/22/21 0700 07/23/21 0531   HGB  --   --  13.5  HCT  --   --  41.3  PLT  --   --  122*  LABPROT 19.7* 22.9* 21.5*  INR 1.7* 2.0* 1.9*  CREATININE 0.62 0.73 0.62     Estimated Creatinine Clearance: 42 mL/min (by C-G formula based on SCr of 0.62 mg/dL).     Medications:  Medications Prior to Admission  Medication Sig Dispense Refill Last Dose   furosemide (LASIX) 40 MG tablet Take 1 tablet (40 mg total) by mouth daily. (Patient taking differently: Take 20-40 mg by mouth daily as needed for edema or fluid.) 90 tablet 1 07/18/2021   hydrocortisone-pramoxine (ANALPRAM-HC) 2.5-1 % rectal cream PLACE 1 APPLICATION RECTALLY SEE ADMINISTRATION INSTRUCTIONS, AFTER EACH BATHROOM USE (Patient taking differently: Place 1 Application rectally daily as needed for hemorrhoids or anal itching.) 60 g 0 Past Week   levothyroxine (EUTHYROX) 50 MCG tablet Take 2 tablets (100 mcg total) by mouth every morning. 180 tablet 3 07/18/2021   methocarbamol (ROBAXIN) 750 MG tablet Take 1 tablet (750 mg total) by mouth 4 (four) times daily. (Patient taking differently: Take 750 mg by mouth every 4 (four) hours as needed for muscle spasms.) 180 tablet 2 07/18/2021 at 0400   ondansetron (ZOFRAN) 4 MG  tablet Take 1 tablet (4 mg total) by mouth every 8 (eight) hours as needed for nausea or vomiting. 60 tablet 0 UNK   oxyCODONE-acetaminophen (PERCOCET) 7.5-325 MG tablet Take 1 tablet by mouth every 4 (four) hours as needed for severe pain. 180 tablet 0 07/17/2021 at 0400   predniSONE (DELTASONE) 5 MG tablet Take 1 tablet (5 mg total) by mouth every morning. 90 tablet 0 07/17/2021   warfarin (COUMADIN) 1 MG tablet TAKE 4 TABLETS BY MOUTH ON MONDAY, WEDNESDAY AND FRIDAY. TAKE 2 TABLETS ON TUESDAY, THURSDAY, SATURDAY AND SUNDAY (Patient taking differently: Take 5 mg by mouth daily.) 90 tablet 0 07/17/2021 at 1630   nitroGLYCERIN (NITROSTAT) 0.3 MG SL tablet Place 0.3 mg under the tongue every 5 (five) minutes as needed for chest pain.   UNK   Scheduled:   Benefiber   1 Dose Oral Daily   furosemide  30 mg Intravenous BID   levothyroxine  100 mcg Oral q morning   potassium chloride  20 mEq Oral BID   predniSONE  5 mg Oral q morning   Warfarin - Pharmacist Dosing Inpatient   Does not apply q1600   Infusions:  PRN: acetaminophen **OR** acetaminophen, hydrocortisone-pramoxine, HYDROmorphone (DILAUDID) injection, methocarbamol, ondansetron **OR** ondansetron (ZOFRAN) IV, mouth rinse, oxyCODONE-acetaminophen Anti-infectives (From admission, onward)    Start     Dose/Rate Route Frequency Ordered Stop   07/18/21 2315  vancomycin (VANCOCIN) IVPB 1000 mg/200 mL premix        1,000 mg 200 mL/hr over 60 Minutes Intravenous  Once 07/18/21 2309 07/19/21 0039       Goal of Therapy:  INR 2-3 Monitor platelets by anticoagulation protocol: Yes    Prior to Admission Warfarin Dosing:  BRINLYNN GORTON takes '3mg'$  of warfarin Monday and Friday, '2mg'$  ROW      INR 1.9  Lab Results  Component Value Date   INR 1.9 (H) 07/23/2021   INR 2.0 (H) 07/22/2021   INR 1.7 (H) 07/21/2021    Assessment: Angela Wong a 81 y.o. female requires anticoagulation with warfarin for the indication of  atrial fibrillation. Warfarin will be initiated inpatient following pharmacy protocol per pharmacy consult. Patient most recent blood work is as follows:    Latest Ref Rng & Units 07/23/2021    5:31 AM 07/20/2021    5:22 AM 07/18/2021   10:52 PM  CBC  WBC 4.0 - 10.5 K/uL 5.6  5.1  5.4   Hemoglobin 12.0 - 15.0 g/dL 13.5  13.3  13.8   Hematocrit 36.0 - 46.0 % 41.3  40.3  42.5   Platelets 150 - 400 K/uL 122  103  107      Plan: Warfarin 3 mg po x 1  Monitor CBC MWF with am labs   Monitor INR daily Monitor for signs and symptoms of bleeding   Margot Ables, PharmD Clinical Pharmacist 07/23/2021 7:58 AM

## 2021-07-23 NOTE — Consult Note (Addendum)
Cardiology Consultation:   Patient ID: Angela Wong MRN: [id number]; DOB: 27-Apr-1940  Admit date: 07/18/2021 Date of Consult: 07/23/2021  PCP:  [name], [facility] Providers Cardiologist:  Marina Goodell, MD        Patient Profile:   Angela Wong is a 81 y.o. female with a hx of ICM who is being seen 07/23/2021 for the evaluation of CHF at the request of Dr. Wynetta Emery.  History of Present Illness:   Ms. Angela Wong with history of permanent Afib on coumadin, NSVT, systolic CHF EF was 07% down to 30-35% 10/2020 during admission for cholangitis, pacemaker for SSS, NSTEMI 10/2019 secondary to occluded PLV 2/2 embolic event with subtherapeutic INR, Regarding her history of congestive heart failure she sees a doctor within [facility]. She is allergic to multiple medications including Coreg, bisoprolol, metoprolol, lisinopril, losartan, spironolactone. She cannot take any heart failure medications. She cannot tolerate Lasix and atenolol it seems. She cannot tolerate DOAC's. She can only tolerate Coumadin for A. fib. Also has cerebral vasculitis, COPD, fibromyalgia, Sjorgren's, chronic pain, 27 medication allergies.  Patient last saw [facility] 03/2021 and declined ischemic eval with reduced EF. Was taking atenolol for rate control. Echo 04/24/21 LVEF 35-40%.  Patient admitted with acute CHF and echo shows further deterioration of LVEF 20-25%. Refusing atenolol now because of "enhancements put in the medication that make her violently ill". Breathing much better now with diuresis. Trouble walking since a fall trying to go to doctor.   Past Medical History:  Diagnosis Date   A-fib ([place])    Asthma    Cerebral vasculitis    Congestive dilated cardiomyopathy (HCC)    COPD (chronic obstructive pulmonary disease) (HCC)    Fibromyalgia    Gastric polyp    GERD (gastroesophageal reflux disease)    Hiatal hernia    Hyperparathyroidism (HCC)    IBS (irritable bowel  syndrome)    MVP (mitral valve prolapse)    Osteoarthritis    Ovarian cancer (HCC)    lymph node removal with hysterectomy   Raynaud's disease    RLS (restless legs syndrome)    Sick sinus syndrome ([place])    Situational depression    Sjogren's syndrome ([place])    Vasculitis ([place])    Vitamin D deficiency     Past Surgical History:  Procedure Laterality Date   ABDOMINAL HYSTERECTOMY  1982   with right oophorectomy   APPENDECTOMY     BREAST BIOPSY Right    x 2   BREAST SURGERY     Biopsy   CARPAL TUNNEL RELEASE Right 1980   CARPAL TUNNEL RELEASE Left 2010   x 2   CATARACT EXTRACTION Bilateral    CESAREAN SECTION     x 3   CHOLECYSTECTOMY N/A 10/23/2020   Procedure: LAPAROSCOPIC CHOLECYSTECTOMY;  Surgeon: [name] Signs, MD;  Location: [facility];  Service: General;  Laterality: N/A;   ENDOSCOPIC RETROGRADE CHOLANGIOPANCREATOGRAPHY (ERCP) WITH PROPOFOL N/A 10/19/2020   Procedure: ENDOSCOPIC RETROGRADE CHOLANGIOPANCREATOGRAPHY (ERCP) WITH PROPOFOL;  Surgeon: Rogene Houston, MD;  Location: [facility];  Service: Endoscopy;  Laterality: N/A;   ESOPHAGEAL DILATION N/A 10/19/2020   Procedure: ESOPHAGEAL DILATION;  Surgeon: Rogene Houston, MD;  Location: [facility];  Service: Endoscopy;  Laterality: N/A;   ESOPHAGOGASTRODUODENOSCOPY (EGD) WITH PROPOFOL N/A 10/19/2020   Procedure: ESOPHAGOGASTRODUODENOSCOPY (EGD) WITH PROPOFOL;  Surgeon: Rogene Houston, MD;  Location: [facility];  Service: Endoscopy;  Laterality: N/A;   KNEE SURGERY Left 2005  LEFT HEART CATH AND CORONARY ANGIOGRAPHY N/A 07/02/2019   Procedure: LEFT HEART CATH AND CORONARY ANGIOGRAPHY;  Surgeon: Jettie Booze, MD;  Location: [facility];  Service: Cardiovascular;  Laterality: N/A;   OOPHORECTOMY Left [place]  09/15/2014   REFRACTIVE SURGERY Bilateral 2014   SPHINCTEROTOMY N/A 10/19/2020   Procedure: SPHINCTEROTOMY;  Surgeon: Rogene Houston, MD;  Location: [facility];  Service: Endoscopy;  Laterality: N/A;    STONE EXTRACTION WITH BASKET N/A 10/19/2020   Procedure: STONE EXTRACTION WITH BASKET;  Surgeon: Rogene Houston, MD;  Location: [facility];  Service: Endoscopy;  Laterality: N/A;   TUBAL LIGATION       Home Medications:  Prior to Admission medications   Medication Sig Start Date End Date Taking? Authorizing Provider  furosemide (LASIX) 40 MG tablet Take 1 tablet (40 mg total) by mouth daily. Patient taking differently: Take 20-40 mg by mouth daily as needed for edema or fluid. 02/05/21 07/19/21 Yes Hawks, Christy A, FNP  hydrocortisone-pramoxine (ANALPRAM-HC) 2.5-1 % rectal cream PLACE 1 APPLICATION RECTALLY SEE ADMINISTRATION INSTRUCTIONS, AFTER EACH BATHROOM USE Patient taking differently: Place 1 Application rectally daily as needed for hemorrhoids or anal itching. 06/21/21  Yes Hawks, Christy A, FNP  levothyroxine (EUTHYROX) 50 MCG tablet Take 2 tablets (100 mcg total) by mouth every morning. 05/24/21  Yes Hawks, Christy A, FNP  methocarbamol (ROBAXIN) 750 MG tablet Take 1 tablet (750 mg total) by mouth 4 (four) times daily. Patient taking differently: Take 750 mg by mouth every 4 (four) hours as needed for muscle spasms. 03/30/21  Yes Hawks, Christy A, FNP  ondansetron (ZOFRAN) 4 MG tablet Take 1 tablet (4 mg total) by mouth every 8 (eight) hours as needed for nausea or vomiting. 06/15/21  Yes Hawks, Christy A, FNP  oxyCODONE-acetaminophen (PERCOCET) 7.5-325 MG tablet Take 1 tablet by mouth every 4 (four) hours as needed for severe pain. 05/01/21  Yes Hawks, Christy A, FNP  predniSONE (DELTASONE) 5 MG tablet Take 1 tablet (5 mg total) by mouth every morning. 06/05/21  Yes Hendricks Limes F, FNP  warfarin (COUMADIN) 1 MG tablet TAKE 4 TABLETS BY MOUTH ON MONDAY, WEDNESDAY AND FRIDAY. TAKE 2 TABLETS ON TUESDAY, THURSDAY, SATURDAY AND SUNDAY Patient taking differently: Take 5 mg by mouth daily. 04/24/21  Yes Hawks, Christy A, FNP  nitroGLYCERIN (NITROSTAT) 0.3 MG SL tablet Place 0.3 mg under the tongue  every 5 (five) minutes as needed for chest pain. 02/28/21   [provider]    Inpatient Medications: Scheduled Meds:  Benefiber  1 Dose Oral Daily   furosemide  30 mg Intravenous BID   levothyroxine  100 mcg Oral q morning   potassium chloride  20 mEq Oral BID   predniSONE  5 mg Oral q morning   warfarin  3 mg Oral ONCE-1600   Warfarin - Pharmacist Dosing Inpatient   Does not apply q1600   Continuous Infusions:  PRN Meds: acetaminophen **OR** acetaminophen, hydrocortisone-pramoxine, HYDROmorphone (DILAUDID) injection, methocarbamol, ondansetron **OR** ondansetron (ZOFRAN) IV, mouth rinse, oxyCODONE-acetaminophen  Allergies:    Allergies  Allergen Reactions   Cephalosporins Hives and Shortness Of Breath   Ciprofloxacin Hives and Shortness Of Breath   Diltiazem Shortness Of Breath    Swollen throat    Doxycycline Hives and Shortness Of Breath   Horse-Derived Products Anaphylaxis   Ketek [Telithromycin] Palpitations    Chest discomfort   Nitrofuran Derivatives Anaphylaxis and Hives    blisters   Nitrous Oxide Nausea And  Vomiting    Severe due to Sjogrens (Auto-Immune Disease)   Other Anaphylaxis    ALLERGY TO HORSE SERUM   Penicillins Hives and Shortness Of Breath        Pentazocine Other (See Comments)    Other reaction(s): Mental Status Changes (intolerance) Altered Mental Status  Altered Mental Status  Altered Mental Status    Sulfa Antibiotics Hives and Shortness Of Breath   Trovan [Alatrofloxacin] Palpitations, Other (See Comments) and Anaphylaxis    Chest pain, dizziness, irregular pulse   Vitamin B12     Confusion and shaking    Zinc Gelatin [Zinc] Anaphylaxis   Amlodipine Swelling   Calcium Channel Blockers     Respiratory distress   Carvedilol Other (See Comments)    Dizziness, "joint pain, depression"   Clindamycin/Lincomycin     CP, lock jaw   Codeine Nausea Only   Cymbalta [Duloxetine Hcl] Swelling   Diovan [Valsartan] Swelling    Lisinopril Swelling   Omeprazole     Generic with additives    Sertraline Other (See Comments)    confusion   Tramadol Nausea Only   Loteprednol Etabonate Rash    Social History:   Social History   Socioeconomic History   Marital status: Single    Spouse name: Not on file   Number of children: Not on file   Years of education: Not on file   Highest education level: Not on file  Occupational History   Not on file  Tobacco Use   Smoking status: Never   Smokeless tobacco: Never  Vaping Use   Vaping Use: Never used  Substance and Sexual Activity   Alcohol use: No   Drug use: No   Sexual activity: Not on file  Other Topics Concern   Not on file  Social History Narrative   Lives alone.  Moved from [place].     Caffeine- 6 cups daily, mix of caffeine/decaf   Children- 3   Retired Therapist, sports   Social Determinants of Health   Financial Resource Strain: High Risk (07/02/2018)   Overall Financial Resource Strain (CARDIA)    Difficulty of Paying Living Expenses: Hard  Food Insecurity: No Food Insecurity (07/02/2018)   Hunger Vital Sign    Worried About Running Out of Food in the Last Year: Never true    Ran Out of Food in the Last Year: Never true  Transportation Needs: Unmet Transportation Needs (05/05/2019)   PRAPARE - Hydrologist (Medical): Yes    Lack of Transportation (Non-Medical): No  Physical Activity: Not on file  Stress: Not on file  Social Connections: Socially Isolated (07/02/2018)   Social Connection and Isolation Panel [NHANES]    Frequency of Communication with Friends and Family: Once a week    Frequency of Social Gatherings with Friends and Family: Once a week    Attends Religious Services: Never    Marine scientist or Organizations: No    Attends Archivist Meetings: Never    Marital Status: Widowed  Human resources officer Violence: Not on file    Family History:     Family History  Problem Relation Age of Onset   Allergies  Mother    COPD Mother    Breast cancer Mother    Allergic rhinitis Mother    Heart disease Father        No details   Kidney disease Father    Allergic rhinitis Father    Stroke Sister  Arthritis/Rheumatoid Sister    Asthma Sister    Lupus Sister    Heart attack Sister    Allergies Sister    Breast cancer Maternal Aunt        x 2   Stroke Paternal Grandmother    Scleroderma Grandchild    Thyroid disease Other      ROS:  Please see the history of present illness.  Review of Systems  Constitutional: Positive for malaise/fatigue and weight loss.  HENT: Negative.    Eyes: Negative.   Cardiovascular:  Positive for irregular heartbeat and leg swelling.  Respiratory: Negative.    Hematologic/Lymphatic: Bruises/bleeds easily.  Musculoskeletal:  Positive for myalgias. Negative for joint pain.  Gastrointestinal: Negative.   Genitourinary: Negative.   Neurological: Negative.     All other ROS reviewed and negative.     Physical Exam/Data:   Vitals:   07/22/21 0631 07/22/21 2111 07/23/21 0500 07/23/21 0539  BP: 103/65 114/70  117/69  Pulse: 88 65  60  Resp:  16  15  Temp: (!) 97.5 F (36.4 C) 97.7 F (36.5 C)  97.9 F (36.6 C)  TempSrc: Oral Oral  Oral  SpO2: 98% 99%  95%  Weight:   48.2 kg   Height:        Intake/Output Summary (Last 24 hours) at 07/23/2021 0804 Last data filed at 07/23/2021 0000 Gross per 24 hour  Intake 60 ml  Output 600 ml  Net -540 ml      07/23/2021    5:00 AM 07/22/2021    5:00 AM 07/21/2021    4:49 AM  Last 3 Weights  Weight (lbs) 106 lb 4.2 oz 105 lb 9.6 oz 106 lb 0.7 oz  Weight (kg) 48.2 kg 47.9 kg 48.1 kg     Body mass index is 18.24 kg/m.  General:  Thin, elderly in no acute distress  HEENT: normal Neck: no JVD Vascular: No carotid bruits; Distal pulses 2+ bilaterally Cardiac:  normal S1, S2; irreg irreg 2/6 systolic murmur LSB Lungs:  decreased breath sounds at bases with fine crackles and scattered rhonchi Abd: soft,  nontender, no hepatomegaly  Ext: trace edema Musculoskeletal:  No deformities, BUE and BLE strength normal and equal Skin: warm and dry  Neuro:  CNs 2-12 intact, no focal abnormalities noted Psych:  Normal affect   EKG:  The EKG was personally reviewed and demonstrates: Afib with PVC's poor R wave progression laterally   Telemetry:  Telemetry was personally reviewed and demonstrates:  Afib, paced, freq PVC's NSVT  Relevant CV Studies: Echo 07/19/21 IMPRESSIONS     1. Left ventricular ejection fraction, by estimation, is 20 to 25%. The  left ventricle has severely decreased function. The left ventricle  demonstrates global hypokinesis. Left ventricular diastolic parameters are  indeterminate.   2. Right ventricular systolic function is mildly reduced. The right  ventricular size is mildly enlarged. There is moderately elevated  pulmonary artery systolic pressure. The estimated right ventricular  systolic pressure is 93.7 mmHg.   3. Left atrial size was severely dilated.   4. Right atrial size was severely dilated.   5. A small pericardial effusion is present. The pericardial effusion is  posterior and lateral to the left ventricle.   6. The mitral valve is grossly normal. Mild mitral valve regurgitation.   7. Tricuspid valve regurgitation is severe.   8. The aortic valve is tricuspid. There is mild calcification of the  aortic valve. There is mild thickening of the aortic valve. Aortic  valve  regurgitation is trivial. Aortic valve sclerosis/calcification is present,  without any evidence of aortic  stenosis.   9. The inferior vena cava is dilated in size with <50% respiratory  variability, suggesting right atrial pressure of 15 mmHg.   Comparison(s): Compared to prior TTE on 10/2020, the LVEF has decreased  from 30-35% to 20-25% and the TR is now severe (previously moderate).   Echo [facility] 04/24/21 Addendum by Valene Bors, MD on 05/07/2021  1:54 PM EDT     Left Ventricle:  Left ventricle is mildly dilated.    Left Ventricle: Systolic function is moderate to severely abnormal. EF:  35-40%.   Right Ventricle: Right ventricle is mildly dilated. Systolic function  is mildly to moderately reduced.    The right ventricular systolic pressure is mildly elevated (37-49  mmHg).   Right Ventricle: A pacing/ICD lead is noted in the right ventricle.    Mitral Valve: There is mild to moderate regurgitation with a centrally  directed jet.    Tricuspid Valve: There is moderate to severe regurgitation.    Pericardium: There is at least moderate size left pleural effusion.   The imaging is on file and stored in a permanent location.   Left Ventricle  Left ventricle is mildly dilated. Wall thickness is normal. Systolic function is moderate to severely abnormal. EF: 35-40%. There is diffuse hypokinesis of the left ventricle. Doppler parameters are indeterminate for diastolic function.   Right Ventricle  Right ventricle is mildly dilated. Systolic function is mildly to moderately reduced. A pacing/ICD lead is noted in the right ventricle.   Left Atrium  Left atrium is moderately dilated. Atrial septum appears intact.   Right Atrium  Right atrium size is normal.   IVC/SVC  The inferior vena cava demonstrates a diameter of <=2.1 cm and collapses <50%; therefore, the right atrial pressure is estimated at 8 mmHg.   Mitral Valve  Mitral valve structure is normal. There is no prolapse. There is mild to moderate regurgitation with a centrally directed jet. There is no evidence of mitral valve stenosis.   Tricuspid Valve  Tricuspid valve structure is normal. There is moderate to severe regurgitation. There is no evidence of tricuspid valve stenosis. The right ventricular systolic pressure is mildly elevated (37-49 mmHg).   Aortic Valve  The aortic valve is tricuspid. The leaflets are not thickened and exhibit normal excursion. Trace aortic valve regurgitation with centrally  directed jet. There is no evidence of aortic valve stenosis.   Pulmonic Valve  The pulmonic valve was not well visualized. Trace regurgitation. No pulmonic stenosis present.   Ascending Aorta  The aortic root is normal in size. The ascending aorta is normal in size.   Pericardium  There is no pericardial effusion. There is at least moderate size left pleural effusion.   Study Details  A complete echo was performed using complete 2D, color flow Doppler and spectral Doppler. Overall the study quality was adequate.   Laboratory Data:  High Sensitivity Troponin:  No results for input(s): "TROPONINIHS" in the last 720 hours.   Chemistry Recent Labs  Lab 07/19/21 0519 07/20/21 0522 07/21/21 0615 07/22/21 0700 07/23/21 0531  NA 136 134* 134* 135 135  K 3.3* 3.4* 3.3* 3.9 3.8  CL 102 97* 96* 99 100  CO2 '28 30 31 31 30  '$ GLUCOSE 79 96 92 83 84  BUN '18 16 19 23 20  '$ CREATININE 0.60 0.69 0.62 0.73 0.62  CALCIUM 8.6* 8.4* 8.3* 8.1* 8.1*  MG 2.0 1.7 1.9  --   --   GFRNONAA >60 >60 >60 >60 >60  ANIONGAP '6 7 7 5 5    '$ Recent Labs  Lab 07/19/21 0519  PROT 6.2*  ALBUMIN 3.5  AST 18  ALT 14  ALKPHOS 126  BILITOT 1.4*   Lipids No results for input(s): "CHOL", "TRIG", "HDL", "LABVLDL", "LDLCALC", "CHOLHDL" in the last 168 hours.  Hematology Recent Labs  Lab 07/18/21 2252 07/20/21 0522 07/23/21 0531  WBC 5.4 5.1 5.6  RBC 4.30 4.06 4.17  HGB 13.8 13.3 13.5  HCT 42.5 40.3 41.3  MCV 98.8 99.3 99.0  MCH 32.1 32.8 32.4  MCHC 32.5 33.0 32.7  RDW 14.0 13.8 13.4  PLT 107* 103* 122*   Thyroid  Recent Labs  Lab 07/18/21 2253  TSH 3.947    BNP Recent Labs  Lab 07/18/21 2252 07/20/21 0522 07/23/21 0532  BNP 1,463.0* 1,329.0* 980.0*    DDimer No results for input(s): "DDIMER" in the last 168 hours.   Radiology/Studies:  ECHOCARDIOGRAM COMPLETE  Result Date: 07/19/2021    ECHOCARDIOGRAM REPORT   Patient Name:   Angela Wong Date of Exam: 07/19/2021 Medical Rec #:   [id number]       Height:       64.0 in Accession #:    [id number]      Weight:       114.4 lb Date of Birth:  [birth year]      BSA:          1.543 m Patient Age:    41 years        BP:           141/73 mmHg Patient Gender: F               HR:           68 bpm. Exam Location:  [facility] Procedure: 2D Echo, Cardiac Doppler and Color Doppler Indications:    CHF  History:        Patient has prior history of Echocardiogram examinations, most                 recent 10/27/2020. Cardiomyopathy and CHF, Previous Myocardial                 Infarction and CAD, Pacemaker, Arrythmias:Atrial Fibrillation,                 Signs/Symptoms:Fatigue, Dyspnea and Chest Pain; Risk                 Factors:Hypertension and Dyslipidemia. Ovarian CA, Raunaud's                 disease.  Sonographer:    Wenda Low Referring Phys: [id number] ASIA B [place]  1. Left ventricular ejection fraction, by estimation, is 20 to 25%. The left ventricle has severely decreased function. The left ventricle demonstrates global hypokinesis. Left ventricular diastolic parameters are indeterminate.  2. Right ventricular systolic function is mildly reduced. The right ventricular size is mildly enlarged. There is moderately elevated pulmonary artery systolic pressure. The estimated right ventricular systolic pressure is 09.3 mmHg.  3. Left atrial size was severely dilated.  4. Right atrial size was severely dilated.  5. A small pericardial effusion is present. The pericardial effusion is posterior and lateral to the left ventricle.  6. The mitral valve is grossly normal. Mild mitral valve regurgitation.  7. Tricuspid valve regurgitation is severe.  8. The aortic valve is tricuspid.  There is mild calcification of the aortic valve. There is mild thickening of the aortic valve. Aortic valve regurgitation is trivial. Aortic valve sclerosis/calcification is present, without any evidence of aortic stenosis.  9. The inferior vena cava is dilated in  size with <50% respiratory variability, suggesting right atrial pressure of 15 mmHg. Comparison(s): Compared to prior TTE on 10/2020, the LVEF has decreased from 30-35% to 20-25% and the TR is now severe (previously moderate). FINDINGS  Left Ventricle: Left ventricular ejection fraction, by estimation, is 20 to 25%. The left ventricle has severely decreased function. The left ventricle demonstrates global hypokinesis. The left ventricular internal cavity size was normal in size. There is no left ventricular hypertrophy. Left ventricular diastolic parameters are indeterminate. Right Ventricle: The right ventricular size is mildly enlarged. No increase in right ventricular wall thickness. Right ventricular systolic function is mildly reduced. There is moderately elevated pulmonary artery systolic pressure. The tricuspid regurgitant velocity is 3.25 m/s, and with an assumed right atrial pressure of 15 mmHg, the estimated right ventricular systolic pressure is 47.8 mmHg. Left Atrium: Left atrial size was severely dilated. Right Atrium: Right atrial size was severely dilated. Pericardium: A small pericardial effusion is present. The pericardial effusion is posterior and lateral to the left ventricle. Mitral Valve: The mitral valve is grossly normal. There is mild thickening of the mitral valve leaflet(s). There is mild calcification of the mitral valve leaflet(s). Mild to moderate mitral annular calcification. Mild mitral valve regurgitation. MV peak  gradient, 4.2 mmHg. The mean mitral valve gradient is 1.0 mmHg. Tricuspid Valve: The tricuspid valve is normal in structure. Tricuspid valve regurgitation is severe. Aortic Valve: The aortic valve is tricuspid. There is mild calcification of the aortic valve. There is mild thickening of the aortic valve. Aortic valve regurgitation is trivial. Aortic valve sclerosis/calcification is present, without any evidence of aortic stenosis. Aortic valve mean gradient measures 2.0  mmHg. Aortic valve peak gradient measures 4.6 mmHg. Aortic valve area, by VTI measures 2.15 cm. Pulmonic Valve: The pulmonic valve was normal in structure. Pulmonic valve regurgitation is mild. Aorta: The aortic root is normal in size and structure. Venous: The inferior vena cava is dilated in size with less than 50% respiratory variability, suggesting right atrial pressure of 15 mmHg. IAS/Shunts: The atrial septum is grossly normal. Additional Comments: A device lead is visualized.  LEFT VENTRICLE PLAX 2D LVIDd:         5.20 cm      Diastology LVIDs:         4.30 cm      LV e' medial:    6.20 cm/s LV PW:         1.00 cm      LV E/e' medial:  16.9 LV IVS:        0.80 cm      LV e' lateral:   10.30 cm/s LVOT diam:     2.00 cm      LV E/e' lateral: 10.2 LV SV:         40 LV SV Index:   26 LVOT Area:     3.14 cm  LV Volumes (MOD) LV vol d, MOD A2C: 101.0 ml LV vol d, MOD A4C: 70.0 ml LV vol s, MOD A2C: 73.2 ml LV vol s, MOD A4C: 55.5 ml LV SV MOD A2C:     27.8 ml LV SV MOD A4C:     70.0 ml LV SV MOD BP:      23.2 ml RIGHT  VENTRICLE RV Basal diam:  4.00 cm RV Mid diam:    2.60 cm RV S prime:     13.70 cm/s TAPSE (M-mode): 2.0 cm LEFT ATRIUM             Index        RIGHT ATRIUM           Index LA diam:        4.70 cm 3.05 cm/m   RA Area:     28.60 cm LA Vol (A2C):   93.2 ml 60.41 ml/m  RA Volume:   107.00 ml 69.35 ml/m LA Vol (A4C):   88.0 ml 57.03 ml/m LA Biplane Vol: 94.2 ml 61.05 ml/m  AORTIC VALVE                    PULMONIC VALVE AV Area (Vmax):    2.35 cm     PV Vmax:       0.69 m/s AV Area (Vmean):   1.99 cm     PV Peak grad:  1.9 mmHg AV Area (VTI):     2.15 cm AV Vmax:           107.50 cm/s AV Vmean:          65.600 cm/s AV VTI:            0.187 m AV Peak Grad:      4.6 mmHg AV Mean Grad:      2.0 mmHg LVOT Vmax:         80.30 cm/s LVOT Vmean:        41.500 cm/s LVOT VTI:          0.128 m LVOT/AV VTI ratio: 0.68  AORTA Ao Root diam: 2.90 cm MITRAL VALVE                TRICUSPID VALVE MV Area (PHT):  3.39 cm     TR Peak grad:   42.2 mmHg MV Area VTI:   1.48 cm     TR Vmax:        325.00 cm/s MV Peak grad:  4.2 mmHg MV Mean grad:  1.0 mmHg     SHUNTS MV Vmax:       1.02 m/s     Systemic VTI:  0.13 m MV Vmean:      42.0 cm/s    Systemic Diam: 2.00 cm MV Decel Time: 224 msec MV E velocity: 105.00 cm/s Gwyndolyn Kaufman MD Electronically signed by Gwyndolyn Kaufman MD Signature Date/Time: 07/19/2021/11:49:56 AM    Final      Assessment and Plan:   Acute on chronic systolic CHF with further reduction of LVEF 20-25%(35-40% at [facility] 04/24/21). Has refused CHF meds in past and multiple drug allergies including metoprolol, coreg, bisoprolol, losartan, lisinopril, spiro. Has diuresed over 3.6L since admission on Lasix 30 mg IV bid. BNP was 1329 now 980. Renal and K stable today. Not much to offer as far as treatment with allergies and refusal of meds. Recommend f/u with cardiologist at [facility].  Permanent Afib on Coumadin, atenolol but refusing it now due to "enhancements in pills". Rate controlled, NSVT but has history of this.  SSS S/P pacemaker  CAD nonobstructive with NSTEMI 2021 occluded PLV 2/2 embolic event with subtherapeutic INR-refused cath by [facility] cardiologist 03/2021  Protein calorie malnutrition, severe   Risk Assessment/Risk Scores:        New York Heart Association (NYHA) Functional Class NYHA Class IV  CHA2DS2-VASc Score = 6  This indicates a 9.7% annual risk of stroke. The patient's score is based upon: CHF History: 1 HTN History: 1 Diabetes History: 0 Stroke History: 0 Vascular Disease History: 1 Age Score: 2 Gender Score: 1         For questions or updates, please contact [place] Please consult [email or web address] for contact info under    Signed, Ermalinda Barrios, PA-C  07/23/2021 8:04 AM  Pt seen and examined   I agree with findings as noted by Gerrianne Scale above  Pt is an 81 yo female with hx of CAD, HFrEF, permanent afib, SSS with pacer placement, COPE  malnutrition, multiple medicine intolerances .   Her most recent cardiac care was this spring at [facility] The pt was admitted to [facility] with CHF exacerbation.    Echo shows LVEF 30 to 25%   Patient has diuresed since admit   Breathing is better  Biggest complaint today is abdoiminal pain, bloating     On exam, pt is a very thin 81 yo   laying in bed  Neck  JVP is normal Lung are Clear at bases    Cardiac Irreg irreg   Gr II/VI systolic murmur at LSB     Abd   Supple    Ext with tr edema    Skin is wrinkled, red    Pts volume status overall is not too bad  Very mild increase I would keep on lasix IV today then switch to 40 mg PO daily  Give 10 KCL with this   Keep on  coumadin for afib     She should keep appt with cardiologist at [facility] for further care  Will sign off  Dorris Carnes MD

## 2021-07-23 NOTE — Progress Notes (Signed)
Pt  noted with repetitive questioning regarding her medication, despite being informed what she was taking pre and post administration. Immediately after administration of IV dilaudid pt is questioning this writer if she had indeed received medication because "I don't feel any different like I usually do". Pt informed that she had received medication with additional staff member present, meds opened up at bedside in her presence and informed when medication had been administered. IV flushed additionally to ensure patency.

## 2021-07-23 NOTE — TOC Progression Note (Signed)
Transition of Care [facility]) - Progression Note    Patient Details  Name: Angela Wong MRN: [id number] Date of Birth: [birth year]  Transition of [facility] CM/SW Contact  Shade Flood, LCSW Phone Number: 07/23/2021, 11:29 AM  Clinical Narrative:     TOC following. Met with pt today to review dc planning. Discussed SNF rehab vs HH and pt is adamant that she will return home with HH at dc. Pt active with [facility] Palliative as an outpatient and she would like to continue this at dc. Per pt, she does not want hospice at this time.   Pt reports that she has 24/7 caregivers. Pt may benefit from a wheelchair and a [employer] fast streamer at [employer].  Palliative APNP to consult on pt today. MD anticipating possible dc tomorrow.  TOC will follow.  Expected Discharge Plan: [place] Barriers to Discharge: Continued Medical Work up  Expected Discharge Plan and Services Expected Discharge Plan: [place] In-house Referral: Clinical Social Work   Post Acute Care Choice: [place] arrangements for the past 2 months: [place] Determinants of Health (SDOH) Interventions    Readmission Risk Interventions    07/19/2021    2:11 PM 10/25/2020    3:19 PM  Readmission Risk Prevention Plan  Transportation Screening Complete Complete  HRI or Home Care Consult Complete Complete  Social Work Consult for [place] Planning/Counseling Complete Complete  Palliative Care Screening Not Applicable Not Applicable  Medication Review Press photographer) Complete Complete

## 2021-07-23 NOTE — Progress Notes (Signed)
PROGRESS NOTE   Angela Wong  IHK:[id number] DOB: Nov 30, 1940 DOA: 07/18/2021 PCP: [name], FNP   Chief Complaint  Patient presents with   Leg Swelling   Level of care: Telemetry  Brief Admission History:  81 y.o. female with medical history significant of atrial fibrillation, cerebral vasculitis, congestive dilated cardiomyopathy, COPD, fibromyalgia, GERD, hyperparathyroidism, hypothyroidism, Sjogren's, situational depression, sick sinus syndrome, Raynaud's, vitamin D deficiency, 27 medication allergies, and more presents to ED with a chief complaint of difficulty with ambulation.   She was admitted with acute HFrEF   Assessment and Plan: * Acute HFrEF - With edema in bilateral lower extremities, venous stasis dermatitis, periorbital edema, and new oxygen requirement 2 L nasal cannula -Known dilated cardiomyopathy -BNP up to 1400 from 600s -40 mg IV Lasix given in the ED -Continue 30 mg IV Lasix twice daily -Echo in the a.m. -Unfortunately Echo 7/6 with findings of further reduced EF down to 20-25% which likely explains fluid gain and increased fatigue and somnolence  -pt refusing to take beta-blocker -Continue to monitor -Pt could not tolerate ReDS vest reading so it was abandoned -palliative care consultation requested    Intake/Output Summary (Last 24 hours) at 07/23/2021 1332 Last data filed at 07/23/2021 0000 Gross per 24 hour  Intake 60 ml  Output 600 ml  Net -540 ml   Filed Weights   07/21/21 0449 07/22/21 0500 07/23/21 0500  Weight: 48.1 kg 47.9 kg 48.2 kg     Protein-calorie malnutrition, severe -- consulted to dietitian   Hypokalemia - continue to supplement potassium while on IV lasix   Acute respiratory failure with hypoxia ([place]) - Most likely secondary to acute heart failure exacerbation -wean oxygen as tolerated  -Chest x-ray shows stable small bilateral pleural effusions and bibasilar atelectasis -Continue treatment for CHF -Wean off O2 as  tolerated  Chronic pain - Continue Percocet and Robaxin - pain has been difficult to control, I have requested palliative consult as patient seems to want to focus more on pain relief and comfort versus continuing full scope acute care, she may elect for full comfort measures  Edema of both lower extremities - With erythema of both lower extremities nearly resolved with diuresis -bilateral venous stasis dermatitis -No leukocytosis, afebrile -antibiotics discontinued, cellulitis ruled out -Continue to monitor -improving with IV lasix diuresis   Hypothyroidism - TSH 3.94 7 -Continue Synthroid  Atrial fibrillation, permanent (HCC) Pt refusing to take beta-blocker Continue warfarin per pharm D Follow INR Goal between 2-3 Continue to monitor  DVT prophylaxis: warfarin  Code Status: Full  Family Communication: discussed with son telephone 7/7 Disposition: Status is: Inpatient Remains inpatient appropriate because: intensity, requiring IV lasix    Consultants:  Palliative care  cardiology Procedures:   Antimicrobials:    Subjective: Pt having a lot of pain, bloating symptoms. No bowel movement.  Agreeable to enema.  Objective: Vitals:   07/22/21 0631 07/22/21 2111 07/23/21 0500 07/23/21 0539  BP: 103/65 114/70  117/69  Pulse: 88 65  60  Resp:  16  15  Temp: (!) 97.5 F (36.4 C) 97.7 F (36.5 C)  97.9 F (36.6 C)  TempSrc: Oral Oral  Oral  SpO2: 98% 99%  95%  Weight:   48.2 kg   Height:        Intake/Output Summary (Last 24 hours) at 07/23/2021 1333 Last data filed at 07/23/2021 0000 Gross per 24 hour  Intake 60 ml  Output 600 ml  Net -540 ml   [employer]  07/21/21 0449 07/22/21 0500 07/23/21 0500  Weight: 48.1 kg 47.9 kg 48.2 kg   Examination:  General exam: Appears calm and comfortable  Respiratory system: no increased work of breathing.  Cardiovascular system: normal S1 & S2 heard. No JVD, murmurs, rubs, gallops or clicks. trace pedal edema  bilateral. Gastrointestinal system: Abdomen is nondistended, soft and nontender. No organomegaly or masses felt. Normal bowel sounds heard. Central nervous system: Alert and oriented. No focal neurological deficits. Extremities: markedly improved with wrinkling of skin, erythema much improved.  Skin: No rashes, lesions or ulcers. Psychiatry: Judgement and insight appear poor. Mood & affect appropriate.   Data Reviewed: I have personally reviewed following labs and imaging studies  CBC: Recent Labs  Lab 07/18/21 2252 07/20/21 0522 07/23/21 0531  WBC 5.4 5.1 5.6  HGB 13.8 13.3 13.5  HCT 42.5 40.3 41.3  MCV 98.8 99.3 99.0  PLT 107* 103* 122*    Basic Metabolic Panel: Recent Labs  Lab 07/18/21 2252 07/19/21 0519 07/20/21 0522 07/21/21 0615 07/22/21 0700 07/23/21 0531  NA 137 136 134* 134* 135 135  K 3.4* 3.3* 3.4* 3.3* 3.9 3.8  CL 104 102 97* 96* 99 100  CO2 '26 28 30 31 31 30  '$ GLUCOSE 85 79 96 92 83 84  BUN '19 18 16 19 23 20  '$ CREATININE 0.65 0.60 0.69 0.62 0.73 0.62  CALCIUM 8.6* 8.6* 8.4* 8.3* 8.1* 8.1*  MG 2.0 2.0 1.7 1.9  --   --     CBG: No results for input(s): "GLUCAP" in the last 168 hours.  Recent Results (from the past 240 hour(s))  SARS Coronavirus 2 by RT PCR (hospital order, performed in [facility] hospital lab) *cepheid single result test* Anterior Nasal Swab     Status: None   Collection Time: 07/19/21 12:25 AM   Specimen: Anterior Nasal Swab  Result Value Ref Range Status   SARS Coronavirus 2 by RT PCR NEGATIVE NEGATIVE Final    Comment: (NOTE) SARS-CoV-2 target nucleic acids are NOT DETECTED.  The SARS-CoV-2 RNA is generally detectable in upper and lower respiratory specimens during the acute phase of infection. The lowest concentration of SARS-CoV-2 viral copies this assay can detect is 250 copies / mL. A negative result does not preclude SARS-CoV-2 infection and should not be used as the sole basis for treatment or other patient management  decisions.  A negative result may occur with improper specimen collection / handling, submission of specimen other than nasopharyngeal swab, presence of viral mutation(s) within the areas targeted by this assay, and inadequate number of viral copies (<250 copies / mL). A negative result must be combined with clinical observations, patient history, and epidemiological information.  Fact Sheet for Patients:   https://www.patel.info/  Fact Sheet for Healthcare Providers: https://hall.com/  This test is not yet approved or  cleared by the [place] FDA and has been authorized for detection and/or diagnosis of SARS-CoV-2 by FDA under an Emergency Use Authorization (EUA).  This EUA will remain in effect (meaning this test can be used) for the duration of the COVID-19 declaration under Section 564(b)(1) of the Act, 21 U.S.C. section 360bbb-3(b)(1), unless the authorization is terminated or revoked sooner.  Performed at [facility], [address], [place], [place] [address]      Radiology Studies: No results found.  Scheduled Meds:  Benefiber  1 Dose Oral Daily   furosemide  30 mg Intravenous BID   levothyroxine  100 mcg Oral q morning   milk and molasses  1 enema Rectal Once   potassium chloride  20 mEq Oral BID   predniSONE  5 mg Oral q morning   warfarin  3 mg Oral ONCE-1600   Warfarin - Pharmacist Dosing Inpatient   Does not apply q1600   Continuous Infusions:   LOS: 5 days   Time spent: 35 mins  Shadell Brenn Wynetta Emery, MD How to contact the [facility] Attending or Consulting provider [place] or covering provider during after hours [place], for this patient?  Check the care team in [facility] and look for a) attending/consulting TRH provider listed and b) the [facility] team listed Log into [email or web address] and use 's universal password to access. If you do not have the password, please contact the hospital operator. Locate the [facility] provider you  are looking for under [facility] and page to a number that you can be directly reached. If you still have difficulty reaching the provider, please page the [facility] (Director on Call) for the Hospitalists listed on amion for assistance.  07/23/2021, 1:33 PM

## 2021-07-23 NOTE — Chronic Care Management (AMB) (Signed)
  Chronic Care Management   Note  07/23/2021 Name: Angela Wong MRN: [id number] DOB: 08-11-1940   Patient has not recently engaged with the Chronic Care Management RN Care Manager. Removing RN Care Manager from Care Team and closing [place]. If patient is currently engaged with another CCM team member I will forward this encounter to inform them of my case closure. Patient may be eligible for re-engagement with RN Care Manager in the future if necessary and can discuss this with their PCP.  [name], BSN, RN-BC Embedded Chronic Care Manager [facility] [place] Management Direct Dial: [phone]

## 2021-07-23 NOTE — Care Management Important Message (Signed)
Important Message  Patient Details  Name: Angela Wong MRN: [id number] Date of Birth: 09-04-1940   Medicare Important Message Given:  Yes     Tommy Medal 07/23/2021, 10:25 AM

## 2021-07-24 DIAGNOSIS — G8929 Other chronic pain: Secondary | ICD-10-CM | POA: Diagnosis not present

## 2021-07-24 DIAGNOSIS — I5041 Acute combined systolic (congestive) and diastolic (congestive) heart failure: Secondary | ICD-10-CM | POA: Diagnosis not present

## 2021-07-24 DIAGNOSIS — I4821 Permanent atrial fibrillation: Secondary | ICD-10-CM | POA: Diagnosis not present

## 2021-07-24 DIAGNOSIS — J9601 Acute respiratory failure with hypoxia: Secondary | ICD-10-CM | POA: Diagnosis not present

## 2021-07-24 LAB — BASIC METABOLIC PANEL
Anion gap: 5 (ref 5–15)
BUN: 19 mg/dL (ref 8–23)
CO2: 32 mmol/L (ref 22–32)
Calcium: 8.1 mg/dL — ABNORMAL LOW (ref 8.9–10.3)
Chloride: 100 mmol/L (ref 98–111)
Creatinine, Ser: 0.71 mg/dL (ref 0.44–1.00)
GFR, Estimated: >60 mL/min (ref >60)
Glucose, Bld: 74 mg/dL (ref 70–99)
Potassium: 3.7 mmol/L (ref 3.5–5.1)
Sodium: 137 mmol/L (ref 135–145)

## 2021-07-24 LAB — PROTIME-INR
INR: 2.2 — ABNORMAL HIGH (ref 0.8–1.2)
Prothrombin Time: 24.5 seconds — ABNORMAL HIGH (ref 11.4–15.2)

## 2021-07-24 MED ORDER — WARFARIN SODIUM 2 MG PO TABS: 2.0000 mg | ORAL_TABLET | Freq: Once | ORAL | Status: DC

## 2021-07-24 MED ORDER — POTASSIUM CHLORIDE CRYS ER 10 MEQ PO TBCR
10.0000 meq | EXTENDED_RELEASE_TABLET | Freq: Every day | ORAL | Status: DC
  Administered 2021-07-24: 10 meq via ORAL
  Filled 2021-07-24: qty 1

## 2021-07-24 MED ORDER — POTASSIUM CHLORIDE CRYS ER 10 MEQ PO TBCR: 10.0000 meq | EXTENDED_RELEASE_TABLET | Freq: Every day | ORAL | 1 refills | Status: DC

## 2021-07-24 MED ORDER — WARFARIN SODIUM 1 MG PO TABS
ORAL_TABLET | ORAL | 0 refills | Status: DC
Start: 2021-07-24 — End: 2021-09-25

## 2021-07-24 MED ORDER — METHOCARBAMOL 750 MG PO TABS: 750.0000 mg | ORAL_TABLET | ORAL | 0 refills | Status: DC | PRN

## 2021-07-24 MED ORDER — OXYCODONE-ACETAMINOPHEN 10-325 MG PO TABS: 1.0000 | ORAL_TABLET | ORAL | 0 refills | Status: DC | PRN

## 2021-07-24 MED ORDER — BENEFIBER PO POWD: 1.0000 | Freq: Every day | ORAL | 0 refills | Status: DC

## 2021-07-24 MED ORDER — FUROSEMIDE 40 MG PO TABS: 40.0000 mg | ORAL_TABLET | Freq: Every day | ORAL | 1 refills | Status: DC

## 2021-07-24 MED ORDER — FUROSEMIDE 40 MG PO TABS
40.0000 mg | ORAL_TABLET | Freq: Every day | ORAL | Status: DC
  Administered 2021-07-24: 40 mg via ORAL
  Filled 2021-07-24: qty 1

## 2021-07-24 NOTE — Plan of Care (Signed)
Pt alert and oriented x 4. Pt continues to require pain medication every 1-2 hours percocet/Zanaflex and dilaudid. Received 1 dose of Zofran this shift. Pt did get up in the chair 7/10.  Problem: Pain Managment: Goal: General experience of comfort will improve Outcome: Not Progressing   Problem: Education: Goal: Knowledge of General Education information will improve Description: Including pain rating scale, medication(s)/side effects and non-pharmacologic comfort measures Outcome: Progressing   Problem: Health Behavior/Discharge Planning: Goal: Ability to manage health-related needs will improve Outcome: Progressing   Problem: Clinical Measurements: Goal: Ability to maintain clinical measurements within normal limits will improve Outcome: Progressing Goal: Will remain free from infection Outcome: Progressing Goal: Diagnostic test results will improve Outcome: Progressing Goal: Respiratory complications will improve Outcome: Progressing Goal: Cardiovascular complication will be avoided Outcome: Progressing   Problem: Activity: Goal: Risk for activity intolerance will decrease Outcome: Progressing

## 2021-07-24 NOTE — Discharge Summary (Addendum)
Physician Discharge Summary  Angela Wong GYI:[id number] DOB: 29-Aug-1940 DOA: 07/18/2021  PCP: [name], FNP  Admit date: 07/18/2021 Discharge date: 07/24/2021  Admitted From:  HOME  Disposition: HOME with palliative care (Refuses SNF)  Recommendations for Outpatient Follow-up:  Follow up with PCP in 1 weeks Follow up with cardiologist in 1-2 weeks  Home Health: RN, PT, OT, SW   Discharge Condition: Stable   CODE STATUS: Full DIET: LOW SODIUM    Brief Hospitalization Summary: Please see all hospital notes, images, labs for full details of the hospitalization. 81 y.o. female with medical history significant of atrial fibrillation, cerebral vasculitis, congestive dilated cardiomyopathy, COPD, fibromyalgia, GERD, hyperparathyroidism, hypothyroidism, Sjogren's, situational depression, sick sinus syndrome, Raynaud's, vitamin D deficiency, 27 medication allergies, and more presents to ED with a chief complaint of difficulty with ambulation.   She was admitted with acute HFrEF   Hospital Course by problem  Assessment and Plan: * Acute HFrEF - With edema in bilateral lower extremities, venous stasis dermatitis, periorbital edema, and new oxygen requirement 2 L nasal cannula -Known dilated cardiomyopathy -BNP up to 1400 from 600s -40 mg IV Lasix given in the ED -Continue 30 mg IV Lasix twice daily - cardiology seen and recommending change to oral lasix 40 mg daily with KCL 10 meq daily. -Unfortunately Echo 7/6 with findings of further reduced EF down to 20-25% which likely explains fluid gain and increased fatigue and somnolence  -pt refusing to take beta-blocker -Continue to monitor -Pt could not tolerate ReDS vest reading so it was abandoned -palliative care consultation requested    Intake/Output Summary (Last 24 hours) at 07/24/2021 1118 Last data filed at 07/24/2021 0500 Gross per 24 hour  Intake 240 ml  Output 700 ml  Net -460 ml   Filed Weights   07/22/21 0500  07/23/21 0500 07/24/21 0500  Weight: 47.9 kg 48.2 kg 48.5 kg     Protein-calorie malnutrition, severe -- consulted to dietitian   Hypokalemia - repleted   Acute respiratory failure with hypoxia (HCC) - Most likely secondary to acute heart failure exacerbation -wean oxygen as tolerated  -Chest x-ray shows stable small bilateral pleural effusions and bibasilar atelectasis -Continue treatment for CHF -Wean off O2 as tolerated  Chronic pain - Continue Percocet and Robaxin - pain has been difficult to control, I have requested palliative consult as patient seems to want to focus more on pain relief and comfort versus continuing full scope acute care, but patient wants to continue full scope care - We have increased percocet from 7.5 to 10 mg every 4 hours as needed for better pain control. She is to follow up with her PCP for further adjustments or speak with palliative services.    Edema of both lower extremities - With erythema of both lower extremities nearly resolved with diuresis -bilateral venous stasis dermatitis -No leukocytosis, afebrile -antibiotics discontinued, cellulitis ruled out -Continue to monitor -improved greatly with IV lasix diuresis   Hypothyroidism - TSH 3.94 7 -Continue Synthroid  Atrial fibrillation, permanent (HCC) Pt refusing to take beta-blocker Continue warfarin per pharm D Follow INR Goal between 2-3 Continue to monitor  Discharge Diagnoses:  Principal Problem:   Acute HFrEF Active Problems:   Atrial fibrillation, permanent (HCC)   Hypothyroidism   Edema of both lower extremities   Chronic pain   Acute respiratory failure with hypoxia (HCC)   Hypokalemia   Protein-calorie malnutrition, severe   Discharge Instructions:  Allergies as of 07/24/2021  Reactions   Cephalosporins Hives, Shortness Of Breath   Ciprofloxacin Hives, Shortness Of Breath   Diltiazem Shortness Of Breath   Swollen throat   Doxycycline Hives, Shortness Of  Breath   Horse-derived Products Anaphylaxis   Ketek [telithromycin] Palpitations   Chest discomfort   Nitrofuran Derivatives Anaphylaxis, Hives   blisters   Nitrous Oxide Nausea And Vomiting   Severe due to Sjogrens (Auto-Immune Disease)   Other Anaphylaxis   ALLERGY TO HORSE SERUM   Penicillins Hives, Shortness Of Breath      Pentazocine Other (See Comments)   Other reaction(s): Mental Status Changes (intolerance) Altered Mental Status  Altered Mental Status  Altered Mental Status    Sulfa Antibiotics Hives, Shortness Of Breath   Trovan [alatrofloxacin] Palpitations, Other (See Comments), Anaphylaxis   Chest pain, dizziness, irregular pulse   Vitamin B12    Confusion and shaking   Zinc Gelatin [zinc] Anaphylaxis   Amlodipine Swelling   Calcium Channel Blockers    Respiratory distress   Carvedilol Other (See Comments)   Dizziness, "joint pain, depression"   Clindamycin/lincomycin    CP, lock jaw   Codeine Nausea Only   Cymbalta [duloxetine Hcl] Swelling   Diovan [valsartan] Swelling   Lisinopril Swelling   Omeprazole    Generic with additives    Sertraline Other (See Comments)   confusion   Tramadol Nausea Only   Loteprednol Etabonate Rash        Medication List     STOP taking these medications    oxyCODONE-acetaminophen 7.5-325 MG tablet Commonly known as: PERCOCET Replaced by: oxyCODONE-acetaminophen 10-325 MG tablet       TAKE these medications    Benefiber Powd Take 1 Dose by mouth daily. Start taking on: July 25, 2021   furosemide 40 MG tablet Commonly known as: Lasix Take 1 tablet (40 mg total) by mouth daily. What changed:  how much to take when to take this reasons to take this   hydrocortisone-pramoxine 2.5-1 % rectal cream Commonly known as: ANALPRAM-HC PLACE 1 APPLICATION RECTALLY SEE ADMINISTRATION INSTRUCTIONS, AFTER EACH BATHROOM USE What changed: See the new instructions.   levothyroxine 50 MCG tablet Commonly known as:  Euthyrox Take 2 tablets (100 mcg total) by mouth every morning.   methocarbamol 750 MG tablet Commonly known as: ROBAXIN Take 1 tablet (750 mg total) by mouth every 4 (four) hours as needed for muscle spasms.   nitroGLYCERIN 0.3 MG SL tablet Commonly known as: NITROSTAT Place 0.3 mg under the tongue every 5 (five) minutes as needed for chest pain.   ondansetron 4 MG tablet Commonly known as: Zofran Take 1 tablet (4 mg total) by mouth every 8 (eight) hours as needed for nausea or vomiting.   oxyCODONE-acetaminophen 10-325 MG tablet Commonly known as: Percocet Take 1 tablet by mouth every 4 (four) hours as needed for pain. Replaces: oxyCODONE-acetaminophen 7.5-325 MG tablet   potassium chloride 10 MEQ tablet Commonly known as: KLOR-CON M Take 1 tablet (10 mEq total) by mouth daily. Start taking on: July 25, 2021   predniSONE 5 MG tablet Commonly known as: DELTASONE Take 1 tablet (5 mg total) by mouth every morning.   warfarin 1 MG tablet Commonly known as: COUMADIN Take as directed. If you are unsure how to take this medication, talk to your nurse or doctor. Original instructions: TAKE 3 TABLETS BY MOUTH ON MONDAY, WEDNESDAY AND FRIDAY. TAKE 2 TABLETS ON TUESDAY, THURSDAY, SATURDAY AND SUNDAY What changed: additional instructions  Follow-up Information     [name], FNP. Schedule an appointment as soon as possible for a visit in 1 week(s).   Specialty: Family Medicine Why: Hospital Follow Up Contact information:  [place] [address] [phone]         Marina Goodell, MD. Schedule an appointment as soon as possible for a visit in 1 week(s).   Specialty: Cardiology Why: Hospital Follow Up Contact information: [address] [place] [address] [phone]                Allergies  Allergen Reactions   Cephalosporins Hives and Shortness Of Breath   Ciprofloxacin Hives and  Shortness Of Breath   Diltiazem Shortness Of Breath    Swollen throat    Doxycycline Hives and Shortness Of Breath   Horse-Derived Products Anaphylaxis   Ketek [Telithromycin] Palpitations    Chest discomfort   Nitrofuran Derivatives Anaphylaxis and Hives    blisters   Nitrous Oxide Nausea And Vomiting    Severe due to Sjogrens (Auto-Immune Disease)   Other Anaphylaxis    ALLERGY TO HORSE SERUM   Penicillins Hives and Shortness Of Breath        Pentazocine Other (See Comments)    Other reaction(s): Mental Status Changes (intolerance) Altered Mental Status  Altered Mental Status  Altered Mental Status    Sulfa Antibiotics Hives and Shortness Of Breath   Trovan [Alatrofloxacin] Palpitations, Other (See Comments) and Anaphylaxis    Chest pain, dizziness, irregular pulse   Vitamin B12     Confusion and shaking    Zinc Gelatin [Zinc] Anaphylaxis   Amlodipine Swelling   Calcium Channel Blockers     Respiratory distress   Carvedilol Other (See Comments)    Dizziness, "joint pain, depression"   Clindamycin/Lincomycin     CP, lock jaw   Codeine Nausea Only   Cymbalta [Duloxetine Hcl] Swelling   Diovan [Valsartan] Swelling   Lisinopril Swelling   Omeprazole     Generic with additives    Sertraline Other (See Comments)    confusion   Tramadol Nausea Only   Loteprednol Etabonate Rash   Allergies as of 07/24/2021       Reactions   Cephalosporins Hives, Shortness Of Breath   Ciprofloxacin Hives, Shortness Of Breath   Diltiazem Shortness Of Breath   Swollen throat   Doxycycline Hives, Shortness Of Breath   Horse-derived Products Anaphylaxis   Ketek [telithromycin] Palpitations   Chest discomfort   Nitrofuran Derivatives Anaphylaxis, Hives   blisters   Nitrous Oxide Nausea And Vomiting   Severe due to Sjogrens (Auto-Immune Disease)   Other Anaphylaxis   ALLERGY TO HORSE SERUM   Penicillins Hives, Shortness Of Breath      Pentazocine Other (See Comments)   Other  reaction(s): Mental Status Changes (intolerance) Altered Mental Status  Altered Mental Status  Altered Mental Status    Sulfa Antibiotics Hives, Shortness Of Breath   Trovan [alatrofloxacin] Palpitations, Other (See Comments), Anaphylaxis   Chest pain, dizziness, irregular pulse   Vitamin B12    Confusion and shaking   Zinc Gelatin [zinc] Anaphylaxis   Amlodipine Swelling   Calcium Channel Blockers    Respiratory distress   Carvedilol Other (See Comments)   Dizziness, "joint pain, depression"   Clindamycin/lincomycin    CP, lock jaw   Codeine Nausea Only   Cymbalta [duloxetine Hcl] Swelling   Diovan [valsartan] Swelling   Lisinopril Swelling   Omeprazole    Generic  with additives    Sertraline Other (See Comments)   confusion   Tramadol Nausea Only   Loteprednol Etabonate Rash        Medication List     STOP taking these medications    oxyCODONE-acetaminophen 7.5-325 MG tablet Commonly known as: PERCOCET Replaced by: oxyCODONE-acetaminophen 10-325 MG tablet       TAKE these medications    Benefiber Powd Take 1 Dose by mouth daily. Start taking on: July 25, 2021   furosemide 40 MG tablet Commonly known as: Lasix Take 1 tablet (40 mg total) by mouth daily. What changed:  how much to take when to take this reasons to take this   hydrocortisone-pramoxine 2.5-1 % rectal cream Commonly known as: ANALPRAM-HC PLACE 1 APPLICATION RECTALLY SEE ADMINISTRATION INSTRUCTIONS, AFTER EACH BATHROOM USE What changed: See the new instructions.   levothyroxine 50 MCG tablet Commonly known as: Euthyrox Take 2 tablets (100 mcg total) by mouth every morning.   methocarbamol 750 MG tablet Commonly known as: ROBAXIN Take 1 tablet (750 mg total) by mouth every 4 (four) hours as needed for muscle spasms.   nitroGLYCERIN 0.3 MG SL tablet Commonly known as: NITROSTAT Place 0.3 mg under the tongue every 5 (five) minutes as needed for chest pain.   ondansetron 4 MG  tablet Commonly known as: Zofran Take 1 tablet (4 mg total) by mouth every 8 (eight) hours as needed for nausea or vomiting.   oxyCODONE-acetaminophen 10-325 MG tablet Commonly known as: Percocet Take 1 tablet by mouth every 4 (four) hours as needed for pain. Replaces: oxyCODONE-acetaminophen 7.5-325 MG tablet   potassium chloride 10 MEQ tablet Commonly known as: KLOR-CON M Take 1 tablet (10 mEq total) by mouth daily. Start taking on: July 25, 2021   predniSONE 5 MG tablet Commonly known as: DELTASONE Take 1 tablet (5 mg total) by mouth every morning.   warfarin 1 MG tablet Commonly known as: COUMADIN Take as directed. If you are unsure how to take this medication, talk to your nurse or doctor. Original instructions: TAKE 3 TABLETS BY MOUTH ON MONDAY, WEDNESDAY AND FRIDAY. TAKE 2 TABLETS ON TUESDAY, THURSDAY, SATURDAY AND 'SUNDAY What changed: additional instructions        Procedures/Studies: ECHOCARDIOGRAM COMPLETE  Result Date: 07/19/2021    ECHOCARDIOGRAM REPORT   Patient Name:   Angela Wong Date of Exam: 07/19/2021 Medical Rec #:  [id number]       Height:       64.0 in Accession #:    [id number]      Weight:       114.4 lb Date of Birth:  10/25/1940      BSA:          1.543 m Patient Age:    80 years        BP:           141/73 mmHg Patient Gender: F               HR:           68'$  bpm. Exam Location:  [facility] Procedure: 2D Echo, Cardiac Doppler and Color Doppler Indications:    CHF  History:        Patient has prior history of Echocardiogram examinations, most                 recent 10/27/2020. Cardiomyopathy and CHF, Previous Myocardial                 Infarction  and CAD, Pacemaker, Arrythmias:Atrial Fibrillation,                 Signs/Symptoms:Fatigue, Dyspnea and Chest Pain; Risk                 Factors:Hypertension and Dyslipidemia. Ovarian CA, Raunaud's                 disease.  Sonographer:    Wenda Low Referring Phys: [id number] ASIA B [place]  1.  Left ventricular ejection fraction, by estimation, is 20 to 25%. The left ventricle has severely decreased function. The left ventricle demonstrates global hypokinesis. Left ventricular diastolic parameters are indeterminate.  2. Right ventricular systolic function is mildly reduced. The right ventricular size is mildly enlarged. There is moderately elevated pulmonary artery systolic pressure. The estimated right ventricular systolic pressure is 42.6 mmHg.  3. Left atrial size was severely dilated.  4. Right atrial size was severely dilated.  5. A small pericardial effusion is present. The pericardial effusion is posterior and lateral to the left ventricle.  6. The mitral valve is grossly normal. Mild mitral valve regurgitation.  7. Tricuspid valve regurgitation is severe.  8. The aortic valve is tricuspid. There is mild calcification of the aortic valve. There is mild thickening of the aortic valve. Aortic valve regurgitation is trivial. Aortic valve sclerosis/calcification is present, without any evidence of aortic stenosis.  9. The inferior vena cava is dilated in size with <50% respiratory variability, suggesting right atrial pressure of 15 mmHg. Comparison(s): Compared to prior TTE on 10/2020, the LVEF has decreased from 30-35% to 20-25% and the TR is now severe (previously moderate). FINDINGS  Left Ventricle: Left ventricular ejection fraction, by estimation, is 20 to 25%. The left ventricle has severely decreased function. The left ventricle demonstrates global hypokinesis. The left ventricular internal cavity size was normal in size. There is no left ventricular hypertrophy. Left ventricular diastolic parameters are indeterminate. Right Ventricle: The right ventricular size is mildly enlarged. No increase in right ventricular wall thickness. Right ventricular systolic function is mildly reduced. There is moderately elevated pulmonary artery systolic pressure. The tricuspid regurgitant velocity is 3.25 m/s,  and with an assumed right atrial pressure of 15 mmHg, the estimated right ventricular systolic pressure is 83.4 mmHg. Left Atrium: Left atrial size was severely dilated. Right Atrium: Right atrial size was severely dilated. Pericardium: A small pericardial effusion is present. The pericardial effusion is posterior and lateral to the left ventricle. Mitral Valve: The mitral valve is grossly normal. There is mild thickening of the mitral valve leaflet(s). There is mild calcification of the mitral valve leaflet(s). Mild to moderate mitral annular calcification. Mild mitral valve regurgitation. MV peak  gradient, 4.2 mmHg. The mean mitral valve gradient is 1.0 mmHg. Tricuspid Valve: The tricuspid valve is normal in structure. Tricuspid valve regurgitation is severe. Aortic Valve: The aortic valve is tricuspid. There is mild calcification of the aortic valve. There is mild thickening of the aortic valve. Aortic valve regurgitation is trivial. Aortic valve sclerosis/calcification is present, without any evidence of aortic stenosis. Aortic valve mean gradient measures 2.0 mmHg. Aortic valve peak gradient measures 4.6 mmHg. Aortic valve area, by VTI measures 2.15 cm. Pulmonic Valve: The pulmonic valve was normal in structure. Pulmonic valve regurgitation is mild. Aorta: The aortic root is normal in size and structure. Venous: The inferior vena cava is dilated in size with less than 50% respiratory variability, suggesting right atrial pressure of 15 mmHg. IAS/Shunts: The atrial septum is grossly  normal. Additional Comments: A device lead is visualized.  LEFT VENTRICLE PLAX 2D LVIDd:         5.20 cm      Diastology LVIDs:         4.30 cm      LV e' medial:    6.20 cm/s LV PW:         1.00 cm      LV E/e' medial:  16.9 LV IVS:        0.80 cm      LV e' lateral:   10.30 cm/s LVOT diam:     2.00 cm      LV E/e' lateral: 10.2 LV SV:         40 LV SV Index:   26 LVOT Area:     3.14 cm  LV Volumes (MOD) LV vol d, MOD A2C: 101.0  ml LV vol d, MOD A4C: 70.0 ml LV vol s, MOD A2C: 73.2 ml LV vol s, MOD A4C: 55.5 ml LV SV MOD A2C:     27.8 ml LV SV MOD A4C:     70.0 ml LV SV MOD BP:      23.2 ml RIGHT VENTRICLE RV Basal diam:  4.00 cm RV Mid diam:    2.60 cm RV S prime:     13.70 cm/s TAPSE (M-mode): 2.0 cm LEFT ATRIUM             Index        RIGHT ATRIUM           Index LA diam:        4.70 cm 3.05 cm/m   RA Area:     28.60 cm LA Vol (A2C):   93.2 ml 60.41 ml/m  RA Volume:   107.00 ml 69.35 ml/m LA Vol (A4C):   88.0 ml 57.03 ml/m LA Biplane Vol: 94.2 ml 61.05 ml/m  AORTIC VALVE                    PULMONIC VALVE AV Area (Vmax):    2.35 cm     PV Vmax:       0.69 m/s AV Area (Vmean):   1.99 cm     PV Peak grad:  1.9 mmHg AV Area (VTI):     2.15 cm AV Vmax:           107.50 cm/s AV Vmean:          65.600 cm/s AV VTI:            0.187 m AV Peak Grad:      4.6 mmHg AV Mean Grad:      2.0 mmHg LVOT Vmax:         80.30 cm/s LVOT Vmean:        41.500 cm/s LVOT VTI:          0.128 m LVOT/AV VTI ratio: 0.68  AORTA Ao Root diam: 2.90 cm MITRAL VALVE                TRICUSPID VALVE MV Area (PHT): 3.39 cm     TR Peak grad:   42.2 mmHg MV Area VTI:   1.48 cm     TR Vmax:        325.00 cm/s MV Peak grad:  4.2 mmHg MV Mean grad:  1.0 mmHg     SHUNTS MV Vmax:       1.02 m/s     Systemic VTI:  0.13 m  MV Vmean:      42.0 cm/s    Systemic Diam: 2.00 cm MV Decel Time: 224 msec MV E velocity: 105.00 cm/s Gwyndolyn Kaufman MD Electronically signed by Gwyndolyn Kaufman MD Signature Date/Time: 07/19/2021/11:49:56 AM    Final    DG Foot Complete Right  Result Date: 07/18/2021 CLINICAL DATA:  Right foot pain swelling EXAM: RIGHT FOOT COMPLETE - 3+ VIEW COMPARISON:  None Available. FINDINGS: Osteopenia is noted. No acute fracture or dislocation is noted. No soft tissue abnormality is seen. IMPRESSION: No acute abnormality noted. Electronically Signed   By: Inez Catalina M.D.   On: 07/18/2021 23:54   DG Foot Complete Left  Result Date: 07/18/2021 CLINICAL  DATA:  Foot swelling and redness, initial encounter EXAM: LEFT FOOT - COMPLETE 3+ VIEW COMPARISON:  None Available. FINDINGS: Mild osteopenia is noted. No acute fracture or dislocation is seen. No soft tissue abnormality is noted. IMPRESSION: Osteopenia without acute abnormality. Electronically Signed   By: Inez Catalina M.D.   On: 07/18/2021 23:53   DG Chest Portable 1 View  Result Date: 07/18/2021 CLINICAL DATA:  Dyspnea, lower extremity edema EXAM: PORTABLE CHEST 1 VIEW COMPARISON:  06/06/2021 FINDINGS: Single frontal view of the chest demonstrates stable single lead pacer. Cardiac silhouette is unremarkable. There are small bilateral pleural effusions unchanged. Patchy bibasilar consolidation consistent with atelectasis. No pneumothorax. No acute bony abnormalities. IMPRESSION: 1. Stable small bilateral pleural effusions and bibasilar atelectasis. Electronically Signed   By: Randa Ngo M.D.   On: 07/18/2021 23:53     Subjective: Pt reports legs are way down now and requesting a purewick from home.   Discharge Exam: Vitals:   07/23/21 2125 07/24/21 0524  BP: 97/62 99/64  Pulse: 61 (!) 59  Resp: 16 16  Temp: 98.3 F (36.8 C) 97.6 F (36.4 C)  SpO2: 99% 98%   Vitals:   07/23/21 1433 07/23/21 2125 07/24/21 0500 07/24/21 0524  BP: 113/60 97/62  99/64  Pulse: 69 61  (!) 59  Resp: '18 16  16  '$ Temp: (!) 97.5 F (36.4 C) 98.3 F (36.8 C)  97.6 F (36.4 C)  TempSrc:  Oral    SpO2: 99% 99%  98%  Weight:   48.5 kg   Height:       General exam: Appears calm and comfortable  Respiratory system: no increased work of breathing.  Cardiovascular system: normal S1 & S2 heard. No JVD, murmurs, rubs, gallops or clicks. trace pedal edema bilateral. Gastrointestinal system: Abdomen is nondistended, soft and nontender. No organomegaly or masses felt. Normal bowel sounds heard. Central nervous system: Alert and oriented. No focal neurological deficits. Extremities: markedly improved with wrinkling  of skin, erythema much improved.  Skin: No rashes, lesions or ulcers. Psychiatry: Judgement and insight appear poor. Mood & affect appropriate   The results of significant diagnostics from this hospitalization (including imaging, microbiology, ancillary and laboratory) are listed below for reference.     Microbiology: Recent Results (from the past 240 hour(s))  SARS Coronavirus 2 by RT PCR (hospital order, performed in [facility] hospital lab) *cepheid single result test* Anterior Nasal Swab     Status: None   Collection Time: 07/19/21 12:25 AM   Specimen: Anterior Nasal Swab  Result Value Ref Range Status   SARS Coronavirus 2 by RT PCR NEGATIVE NEGATIVE Final    Comment: (NOTE) SARS-CoV-2 target nucleic acids are NOT DETECTED.  The SARS-CoV-2 RNA is generally detectable in upper and lower respiratory specimens during the acute phase  of infection. The lowest concentration of SARS-CoV-2 viral copies this assay can detect is 250 copies / mL. A negative result does not preclude SARS-CoV-2 infection and should not be used as the sole basis for treatment or other patient management decisions.  A negative result may occur with improper specimen collection / handling, submission of specimen other than nasopharyngeal swab, presence of viral mutation(s) within the areas targeted by this assay, and inadequate number of viral copies (<250 copies / mL). A negative result must be combined with clinical observations, patient history, and epidemiological information.  Fact Sheet for Patients:   https://www.patel.info/  Fact Sheet for Healthcare Providers: https://hall.com/  This test is not yet approved or  cleared by the [place] FDA and has been authorized for detection and/or diagnosis of SARS-CoV-2 by FDA under an Emergency Use Authorization (EUA).  This EUA will remain in effect (meaning this test can be used) for the duration of  the COVID-19 declaration under Section 564(b)(1) of the Act, 21 U.S.C. section 360bbb-3(b)(1), unless the authorization is terminated or revoked sooner.  Performed at [facility], [address], [place], [place] [address]      Labs: BNP (last 3 results) Recent Labs    07/18/21 2252 07/20/21 0522 07/23/21 0532  BNP 1,463.0* 1,329.0* 277.4*   Basic Metabolic Panel: Recent Labs  Lab 07/18/21 2252 07/19/21 0519 07/20/21 0522 07/21/21 0615 07/22/21 0700 07/23/21 0531 07/24/21 0511  NA 137 136 134* 134* 135 135 137  K 3.4* 3.3* 3.4* 3.3* 3.9 3.8 3.7  CL 104 102 97* 96* 99 100 100  CO2 '26 28 30 31 31 30 '$ 32  GLUCOSE 85 79 96 92 83 84 74  BUN '19 18 16 19 23 20 19  '$ CREATININE 0.65 0.60 0.69 0.62 0.73 0.62 0.71  CALCIUM 8.6* 8.6* 8.4* 8.3* 8.1* 8.1* 8.1*  MG 2.0 2.0 1.7 1.9  --   --   --    Liver Function Tests: Recent Labs  Lab 07/19/21 0519  AST 18  ALT 14  ALKPHOS 126  BILITOT 1.4*  PROT 6.2*  ALBUMIN 3.5   No results for input(s): "LIPASE", "AMYLASE" in the last 168 hours. No results for input(s): "AMMONIA" in the last 168 hours. CBC: Recent Labs  Lab 07/18/21 2252 07/20/21 0522 07/23/21 0531  WBC 5.4 5.1 5.6  HGB 13.8 13.3 13.5  HCT 42.5 40.3 41.3  MCV 98.8 99.3 99.0  PLT 107* 103* 122*   Cardiac Enzymes: No results for input(s): "CKTOTAL", "CKMB", "CKMBINDEX", "TROPONINI" in the last 168 hours. BNP: Invalid input(s): "POCBNP" CBG: No results for input(s): "GLUCAP" in the last 168 hours. D-Dimer No results for input(s): "DDIMER" in the last 72 hours. Hgb A1c No results for input(s): "HGBA1C" in the last 72 hours. Lipid Profile No results for input(s): "CHOL", "HDL", "LDLCALC", "TRIG", "CHOLHDL", "LDLDIRECT" in the last 72 hours. Thyroid function studies No results for input(s): "TSH", "T4TOTAL", "T3FREE", "THYROIDAB" in the last 72 hours.  Invalid input(s): "FREET3" Anemia work up No results for input(s): "VITAMINB12", "FOLATE", "FERRITIN",  "TIBC", "IRON", "RETICCTPCT" in the last 72 hours. Urinalysis    Component Value Date/Time   COLORURINE AMBER (A) 10/17/2020 1539   APPEARANCEUR CLEAR 10/17/2020 1539   APPEARANCEUR Cloudy (A) 09/29/2020 1232   LABSPEC >1.046 (H) 10/17/2020 1539   PHURINE 5.0 10/17/2020 1539   GLUCOSEU NEGATIVE 10/17/2020 1539   HGBUR NEGATIVE 10/17/2020 1539   BILIRUBINUR SMALL (A) 10/17/2020 1539   BILIRUBINUR Negative 09/29/2020 [place] 10/17/2020  [place] (A) 10/17/2020 1539   UROBILINOGEN negative 11/26/2013 1238   NITRITE NEGATIVE 10/17/2020 1539   LEUKOCYTESUR NEGATIVE 10/17/2020 1539   Sepsis Labs Recent Labs  Lab 07/18/21 2252 07/20/21 0522 07/23/21 0531  WBC 5.4 5.1 5.6   Microbiology Recent Results (from the past 240 hour(s))  SARS Coronavirus 2 by RT PCR (hospital order, performed in [facility] hospital lab) *cepheid single result test* Anterior Nasal Swab     Status: None   Collection Time: 07/19/21 12:25 AM   Specimen: Anterior Nasal Swab  Result Value Ref Range Status   SARS Coronavirus 2 by RT PCR NEGATIVE NEGATIVE Final    Comment: (NOTE) SARS-CoV-2 target nucleic acids are NOT DETECTED.  The SARS-CoV-2 RNA is generally detectable in upper and lower respiratory specimens during the acute phase of infection. The lowest concentration of SARS-CoV-2 viral copies this assay can detect is 250 copies / mL. A negative result does not preclude SARS-CoV-2 infection and should not be used as the sole basis for treatment or other patient management decisions.  A negative result may occur with improper specimen collection / handling, submission of specimen other than nasopharyngeal swab, presence of viral mutation(s) within the areas targeted by this assay, and inadequate number of viral copies (<250 copies / mL). A negative result must be combined with clinical observations, patient history, and epidemiological information.  Fact Sheet for Patients:    https://www.patel.info/  Fact Sheet for Healthcare Providers: https://hall.com/  This test is not yet approved or  cleared by the [place] FDA and has been authorized for detection and/or diagnosis of SARS-CoV-2 by FDA under an Emergency Use Authorization (EUA).  This EUA will remain in effect (meaning this test can be used) for the duration of the COVID-19 declaration under Section 564(b)(1) of the Act, 21 U.S.C. section 360bbb-3(b)(1), unless the authorization is terminated or revoked sooner.  Performed at [facility], [address] [place],  [address]     Time coordinating discharge: 42 mins  SIGNED:  Irwin Brakeman, MD  [facility] 07/24/2021, 11:25 AM How to contact the [facility] Attending or Consulting provider [place] or covering provider during after hours [place], for this patient?  Check the care team in [facility] and look for a) attending/consulting TRH provider listed and b) the [facility] team listed Log into [email or web address] and use [place]'s universal password to access. If you do not have the password, please contact the hospital operator. Locate the [facility] provider you are looking for under [facility] and page to a number that you can be directly reached. If you still have difficulty reaching the provider, please page the [facility] (Director on Call) for the Hospitalists listed on amion for assistance.

## 2021-07-24 NOTE — Progress Notes (Signed)
Discharge instructions reviewed with patient and patients caregiver.  Both verbalized understanding of instructions. Patient discharged home via EMS instable condition.

## 2021-07-24 NOTE — TOC Transition Note (Signed)
Transition of Care [facility]) - CM/SW Discharge Note   Patient Details  Name: Angela Wong MRN: [id number] Date of Birth: 02/12/1940  Transition of Care [facility]) CM/SW Contact:  Shade Flood, LCSW Phone Number: 07/24/2021, 1:32 PM   Clinical Narrative:     Pt stable for dc per MD. Plan remains for dc home with [facility]. Updated [place] at [place]. [place] arranged with [employer] and will be delivered to pt's home.   EMS arranged. There are no other TOC needs for dc.  Final next level of care: [place] Barriers to Discharge: Barriers Resolved   Patient Goals and CMS Choice Patient states their goals for this hospitalization and ongoing recovery are:: reutrn home CMS Medicare.gov Compare Post Acute Care list provided to:: Patient Choice offered to / list presented to : Patient, Adult Children  Discharge Placement                       Discharge Plan and Services In-house Referral: Clinical Social Work   Post Acute Care Choice: Home Health          DME Arranged: Other see comment Harrel Lemon Lift)   Date DME Agency Contacted: 07/24/21   Representative spoke with at DME Agency: Fara Olden HH Arranged: RN, PT, OT, Social Work   Date [place]: 07/19/21   Representative spoke with at [place]: [place] ([place]) Interventions     Readmission Risk Interventions    07/19/2021    2:11 PM 10/25/2020    3:19 PM  Readmission Risk Prevention Plan  Transportation Screening Complete Complete  HRI or Home Care Consult Complete Complete  Social Work Consult for [place] Planning/Counseling Complete Complete  Palliative Care Screening Not Applicable Not Applicable  Medication Review Press photographer) Complete Complete

## 2021-07-24 NOTE — Progress Notes (Signed)
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)   Other Anaphylaxis    ALLERGY TO HORSE SERUM   Penicillins Hives and Shortness Of Breath        Pentazocine Other (See Comments)    Other reaction(s): Mental Status Changes (intolerance) Altered Mental Status  Altered Mental Status  Altered Mental Status    Sulfa Antibiotics Hives and Shortness Of Breath   Trovan [Alatrofloxacin] Palpitations, Other (See Comments) and Anaphylaxis    Chest pain, dizziness, irregular pulse   Vitamin B12     Confusion and shaking    Zinc Gelatin [Zinc] Anaphylaxis   Amlodipine Swelling   Calcium Channel Blockers     Respiratory distress   Carvedilol Other (See Comments)    Dizziness, "joint pain, depression"   Clindamycin/Lincomycin     CP, lock jaw   Codeine Nausea Only   Cymbalta [Duloxetine Hcl] Swelling   Diovan [Valsartan] Swelling   Lisinopril Swelling   Omeprazole     Generic with additives    Sertraline Other (See Comments)    confusion   Tramadol Nausea Only   Loteprednol Etabonate Rash      Patient Measurements: Last Weight  Most recent update: 07/24/2021  5:21 AM    Weight  48.5 kg (106 lb 14.8 oz)            Body mass index is 18.35 kg/m. Angela Wong               Temp: 97.6 F (36.4 C) (07/11 0524) Temp Source: Oral (07/10 2125) BP: 99/64 (07/11 0524) Pulse Rate: 59 (07/11 0524)  Labs: Recent Labs    07/22/21 0700 07/23/21 0531 07/24/21 0511   HGB  --  13.5  --   HCT  --  41.3  --   PLT  --  122*  --   LABPROT 22.9* 21.5* 24.5*  INR 2.0* 1.9* 2.2*  CREATININE 0.73 0.62 0.71     Estimated Creatinine Clearance: 42.2 mL/min (by C-G formula based on SCr of 0.71 mg/dL).     Medications:  Medications Prior to Admission  Medication Sig Dispense Refill Last Dose   furosemide (LASIX) 40 MG tablet Take 1 tablet (40 mg total) by mouth daily. (Patient taking differently: Take 20-40 mg by mouth daily as needed for edema or fluid.) 90 tablet 1 07/18/2021   hydrocortisone-pramoxine (ANALPRAM-HC) 2.5-1 % rectal cream PLACE 1 APPLICATION RECTALLY SEE ADMINISTRATION INSTRUCTIONS, AFTER EACH BATHROOM USE (Patient taking differently: Place 1 Application rectally daily as needed for hemorrhoids or anal itching.) 60 g 0 Past Week   levothyroxine (EUTHYROX) 50 MCG tablet Take 2 tablets (100 mcg total) by mouth every morning. 180 tablet 3 07/18/2021   methocarbamol (ROBAXIN) 750 MG tablet Take 1 tablet (750 mg total) by mouth 4 (four) times daily. (Patient taking differently: Take 750 mg by mouth every 4 (four) hours as needed for muscle spasms.) 180 tablet 2 07/18/2021 at 0400   ondansetron (ZOFRAN) 4 MG  tablet Take 1 tablet (4 mg total) by mouth every 8 (eight) hours as needed for nausea or vomiting. 60 tablet 0 UNK   oxyCODONE-acetaminophen (PERCOCET) 7.5-325 MG tablet Take 1 tablet by mouth every 4 (four) hours as needed for severe pain. 180 tablet 0 07/17/2021 at 0400   predniSONE (DELTASONE) 5 MG tablet Take 1 tablet (5 mg total) by mouth every morning. 90 tablet 0 07/17/2021   warfarin (COUMADIN) 1 MG tablet TAKE 4 TABLETS BY MOUTH ON MONDAY, WEDNESDAY AND FRIDAY. TAKE 2 TABLETS ON TUESDAY, THURSDAY, SATURDAY AND SUNDAY (Patient taking differently: Take 5 mg by mouth daily.) 90 tablet 0 07/17/2021 at 1630   nitroGLYCERIN (NITROSTAT) 0.3 MG SL tablet Place 0.3 mg under the tongue every 5 (five) minutes as needed for chest pain.   UNK   Scheduled:    Benefiber  1 Dose Oral Daily   furosemide  40 mg Oral Daily   levothyroxine  100 mcg Oral q morning   potassium chloride  10 mEq Oral Daily   predniSONE  5 mg Oral q morning   Warfarin - Pharmacist Dosing Inpatient   Does not apply q1600   Infusions:  PRN: acetaminophen **OR** acetaminophen, Glycerin (Adult), hydrocortisone-pramoxine, HYDROmorphone (DILAUDID) injection, methocarbamol, ondansetron **OR** ondansetron (ZOFRAN) IV, mouth rinse, oxyCODONE-acetaminophen Anti-infectives (From admission, onward)    Start     Dose/Rate Route Frequency Ordered Stop   07/18/21 2315  vancomycin (VANCOCIN) IVPB 1000 mg/200 mL premix        1,000 mg 200 mL/hr over 60 Minutes Intravenous  Once 07/18/21 2309 07/19/21 0039       Goal of Therapy:  INR 2-3 Monitor platelets by anticoagulation protocol: Yes    Prior to Admission Warfarin Dosing:  Angela Wong takes '3mg'$  of warfarin Monday and Friday, '2mg'$  ROW      INR 2.2, therapeutic  Lab Results  Component Value Date   INR 2.2 (H) 07/24/2021   INR 1.9 (H) 07/23/2021   INR 2.0 (H) 07/22/2021    Assessment: Angela Wong a 81 y.o. female requires anticoagulation with warfarin for the indication of  atrial fibrillation. Warfarin will be initiated inpatient following pharmacy protocol per pharmacy consult. Patient most recent blood work is as follows:    Latest Ref Rng & Units 07/23/2021    5:31 AM 07/20/2021    5:22 AM 07/18/2021   10:52 PM  CBC  WBC 4.0 - 10.5 K/uL 5.6  5.1  5.4   Hemoglobin 12.0 - 15.0 g/dL 13.5  13.3  13.8   Hematocrit 36.0 - 46.0 % 41.3  40.3  42.5   Platelets 150 - 400 K/uL 122  103  107      Plan: Warfarin 2 mg po x 1  Monitor CBC MWF with am labs   Monitor INR daily Monitor for signs and symptoms of bleeding   Isac Sarna, BS Pharm D, BCPS Clinical Pharmacist 07/24/2021 9:13 AM

## 2021-07-25 ENCOUNTER — Other Ambulatory Visit: Payer: Self-pay | Admitting: *Deleted

## 2021-07-25 ENCOUNTER — Telehealth: Payer: Self-pay | Admitting: *Deleted

## 2021-07-25 ENCOUNTER — Telehealth: Payer: Self-pay

## 2021-07-25 DIAGNOSIS — I5022 Chronic systolic (congestive) heart failure: Secondary | ICD-10-CM

## 2021-07-25 NOTE — Telephone Encounter (Signed)
Transition Care Management Follow-up Telephone Call Date of discharge and from where: 07/24/2021 [facility] How have you been since you were released from the hospital? Not good. Almost completely dependent. Was told she may have less that 6 months to live Any questions or concerns? Yes - as noted above; also they need to make her son POA, but she is unable to leave home, so they need guidance to do this.   Items Reviewed: Did the pt receive and understand the discharge instructions provided? Yes  Medications obtained and verified? Yes  Other? No  Any new allergies since your discharge? No  Dietary orders reviewed? Yes Do you have support at home? Yes   Home Care and Equipment/Supplies: Were home health services ordered? yes If so, what is the name of the agency? Hospice she thinks  Has the agency set up a time to come to the patient's home? no Were any new equipment or medical supplies ordered?  No  Functional Questionnaire: (I = Independent and D = Dependent) ADLs: D  Bathing/Dressing- D  Meal Prep- D  Eating- I  Maintaining continence- D  Transferring/Ambulation- D  Managing Meds- D  Follow up appointments reviewed:  PCP Hospital f/u appt confirmed? Yes  Scheduled to see [name] on 07/30/21 @ 1:55 - requesting phone visit - doesn't have good enough service to do video and they are unable to transport her [facility] f/u appt confirmed? No   Are transportation arrangements needed?  Unable to transport If their condition worsens, is the pt aware to call PCP or go to the Emergency Dept.? Yes Was the patient provided with contact information for the PCP's office or ED? Yes Was to pt encouraged to call back with questions or concerns? Yes

## 2021-07-25 NOTE — Telephone Encounter (Signed)
   Telephone encounter was:  Successful.  07/25/2021 Name: ANNYE FORREY MRN: [id number] DOB: 1940-05-15  TERRYE DOMBROSKY is a 81 y.o. year old female who is a primary care patient of [name], FNP . The community resource team was consulted for assistance with Transportation Needs   Care guide performed the following interventions: Patient provided with information about care guide support team and interviewed to confirm resource needs.  Follow Up Plan:  Care guide will follow up with patient by phone over the next day  [place], Care Management  [phone] [address] , [place] [address] Email : Ashby Dawes. [email or web address]-moran [email or web address]

## 2021-07-25 NOTE — Patient Outreach (Addendum)
  Care Coordination TOC Note Transition Care Management Follow-up Telephone Call Date of discharge and from where: [phone] How have you been since you were released from the hospital? I have been in severe pain I have a migraine and the transporter wouldn't give me anything Any questions or concerns? Yes Patient has an aide that is self pay to get her up with the hoyer. It takes two people and the second aide has been having some transportation problems and didn't come today.  Items Reviewed: Did the pt receive and understand the discharge instructions provided? Yes  Medications obtained and verified? No Patient son is to go and pick up her medications and he has not done it yet. RN discussed with patient about letting son know he needs to pick up medication today.  Other? No  Any new allergies since your discharge? No  Dietary orders reviewed? No Do you have support at home? Yes  Patient has an [place] and Equipment/Supplies: Were home health services ordered? no If so, what is the name of the agency? N/A  Has the agency set up a time to come to the patient's home? no Were any new equipment or medical supplies ordered?yes a hoyer lift What is the name of the medical supply agency? unknown Were you able to get the supplies/equipment? yes Do you have any questions related to the use of the equipment or supplies? Yes: per patient it takes two persons to get her up with the hoyer The hoyer lift has been delivered but it takes to persons to help get her up. She has one aide and the the other aide has transportation problems. Functional Questionnaire: (I = Independent and D = Dependent) ADLs: total assist with aide  Bathing/Dressing- total assist with aide   Meal Prep- Total assist  Eating- needs assist  Maintaining continence- total assist with aide  Transferring/Ambulation- Total assist with aide and equipment  Managing Meds- total assist  Follow up appointments  reviewed:  PCP Hospital f/u appt confirmed? No  will refer for scheduling Specialist Hospital f/u appt confirmed? NO needs scheduling Are transportation arrangements needed?  Son usually takes with aide but patient doesn't think he can. Patient came home via non urgent ambulance . May need transportation or phone visit. Referred for transportation If their condition worsens, is the pt aware to call PCP or go to the Emergency Dept.? Yes Was the patient provided with contact information for the PCP's office or ED? Yes Was to pt encouraged to call back with questions or concerns? Yes  SDOH assessments and interventions completed:   Yes  Care Coordination Interventions Activated:  Yes Care Coordination Interventions:  PCP follow up appointment requested and referred for transportation need to PCP and specialist  Encounter Outcome:  Pt. Visit Completed

## 2021-07-27 ENCOUNTER — Telehealth: Payer: Self-pay | Admitting: Family

## 2021-07-27 NOTE — Telephone Encounter (Signed)
FYI

## 2021-07-30 ENCOUNTER — Encounter: Payer: Self-pay | Admitting: Family

## 2021-07-30 ENCOUNTER — Ambulatory Visit: Payer: Medicare Other | Admitting: Family

## 2021-07-31 ENCOUNTER — Telehealth: Payer: Self-pay | Admitting: *Deleted

## 2021-07-31 NOTE — Telephone Encounter (Signed)
   Telephone encounter was:  Successful.  07/31/2021 Name: DEDRIA ENDRES MRN: [id number] DOB: [birth year]  AUDI CONOVER is a 81 y.o. year old female who is a primary care patient of [name], FNP . The community resource team was consulted for assistance with Transportation Needs   Care guide performed the following interventions: Patient provided with information about care guide support team and interviewed to confirm resource needs.  Follow Up Plan:  No further follow up planned at this time. The patient has been provided with needed resources.  [place], Care Management  [phone] [address] , [place] [address] Email : Ashby Dawes. Green[place]-moran [email or web address]

## 2021-08-14 ENCOUNTER — Other Ambulatory Visit: Payer: Self-pay | Admitting: *Deleted

## 2021-08-14 DIAGNOSIS — M1990 Unspecified osteoarthritis, unspecified site: Secondary | ICD-10-CM

## 2021-08-14 DIAGNOSIS — Z515 Encounter for palliative care: Secondary | ICD-10-CM

## 2021-08-14 DIAGNOSIS — M0579 Rheumatoid arthritis with rheumatoid factor of multiple sites without organ or systems involvement: Secondary | ICD-10-CM

## 2021-08-14 NOTE — Telephone Encounter (Signed)
TC from [place] want to know if we could RF her Methocarbamol to [employer] by Dr. Wynetta Emery at hospital discharge on 07/24/21 750 mg 1 Q 4 hrs prn for muscle spasms

## 2021-08-15 ENCOUNTER — Telehealth: Payer: Self-pay | Admitting: *Deleted

## 2021-08-15 DIAGNOSIS — I4821 Permanent atrial fibrillation: Secondary | ICD-10-CM

## 2021-08-15 LAB — POCT INR: INR: 1.7 — AB (ref 2–3)

## 2021-08-15 NOTE — Telephone Encounter (Signed)
Pt aware of results and recommendations and voiced understanding. 

## 2021-08-15 NOTE — Telephone Encounter (Signed)
Fax received mdINR PT/INR self testing service Test date/time 08/14/21 346 pm INR 1.7

## 2021-08-15 NOTE — Telephone Encounter (Signed)
INR was 1.7  today (goal is 2.0 to 3.0)   Take 4 mg today (08/15/21) then continue with current dose of  3 mg on Mondays and Friday with 2 mg all other days    Recheck in 1 week

## 2021-08-16 MED ORDER — METHOCARBAMOL 750 MG PO TABS
750.0000 mg | ORAL_TABLET | ORAL | 2 refills | Status: DC | PRN
Start: 2021-08-16 — End: 2022-01-15

## 2021-08-21 ENCOUNTER — Telehealth: Payer: Self-pay | Admitting: *Deleted

## 2021-08-21 DIAGNOSIS — I4821 Permanent atrial fibrillation: Secondary | ICD-10-CM

## 2021-08-21 NOTE — Telephone Encounter (Signed)
Patient aware and verbalized understanding. °

## 2021-08-21 NOTE — Telephone Encounter (Signed)
Fax received mdINR PT/INR self testing service Test date/time 08/21/21 1153 am INR 3.3

## 2021-08-21 NOTE — Telephone Encounter (Signed)
Description   INR was 3.3 today (goal is 2.0 to 3.0)   Hold today dose then continue with current dose of  3 mg on Mondays and Friday with 2 mg all other days    Recheck in 1 week

## 2021-08-28 ENCOUNTER — Telehealth: Payer: Self-pay | Admitting: *Deleted

## 2021-08-28 DIAGNOSIS — I4821 Permanent atrial fibrillation: Secondary | ICD-10-CM

## 2021-08-28 NOTE — Telephone Encounter (Signed)
Description   INR was 3.9 today (goal is 2.0 to 3.0)   Hold today dose then decrease to  2 mg all other days    Recheck in 1 week

## 2021-08-28 NOTE — Telephone Encounter (Signed)
Patient aware and verbalized understanding. °

## 2021-08-28 NOTE — Telephone Encounter (Signed)
Fax received mdINR PT/INR self testing service Test date/time 08/28/21 1144 am INR 3.9

## 2021-09-04 ENCOUNTER — Telehealth: Payer: Self-pay | Admitting: *Deleted

## 2021-09-04 DIAGNOSIS — I4821 Permanent atrial fibrillation: Secondary | ICD-10-CM

## 2021-09-04 NOTE — Telephone Encounter (Signed)
Fax received mdINR PT/INR self testing service Test date/time 09/04/21 1052 am INR 2.1

## 2021-09-04 NOTE — Telephone Encounter (Signed)
Patient aware and verbalized understanding. °

## 2021-09-04 NOTE — Telephone Encounter (Signed)
Description   INR was 2.1 today (goal is 2.0 to 3.0)   Continue  2 mg all other days    Recheck in 1 week

## 2021-09-11 ENCOUNTER — Telehealth: Payer: Self-pay | Admitting: *Deleted

## 2021-09-11 DIAGNOSIS — I4821 Permanent atrial fibrillation: Secondary | ICD-10-CM

## 2021-09-11 NOTE — Telephone Encounter (Signed)
Patient aware of results.

## 2021-09-11 NOTE — Telephone Encounter (Signed)
Fax received mdINR PT/INR self testing service Test date/time 09/11/21 814 am INR 2.9

## 2021-09-11 NOTE — Telephone Encounter (Signed)
Description   INR was 2.9 today (goal is 2.0 to 3.0)   Continue  2 mg all other days    Recheck in 1 week

## 2021-09-19 ENCOUNTER — Telehealth: Payer: Self-pay | Admitting: Family Medicine

## 2021-09-19 DIAGNOSIS — I4821 Permanent atrial fibrillation: Secondary | ICD-10-CM

## 2021-09-19 LAB — PROTIME-INR: INR: 4 — AB (ref 0.80–1.20)

## 2021-09-19 NOTE — Telephone Encounter (Signed)
Fax received mdINR PT/INR self testing service Test date/time 09/19/21 09:51am INR 4.0

## 2021-09-20 NOTE — Telephone Encounter (Signed)
Pt is not accepting this order from [facility].  Christy, please let us what to tell her on how YOU would change her dose.   I asked her what doses she was currently taking and she was unable to tell me - she went into talking about the maker and the additives and wants something different called in   Maybe this can be a TELE call and visit today

## 2021-09-20 NOTE — Telephone Encounter (Signed)
Pt is aware of Christy's recommendations and understood.  She does want to make Christy aware that she believes with the last refill of Coumadin that it is causing a hive like rash. She is itching. Wants to know if something can be changed with the prescription to help. States that when she was taking 2.'5mg'$  that she did not have this problem.

## 2021-09-20 NOTE — Telephone Encounter (Signed)
Change coumadin as follows:  Take 2 mg qod and 1 mg qod, alternating

## 2021-09-20 NOTE — Telephone Encounter (Signed)
Description   INR was 4.0 today (goal is 2.0 to 3.0)   Hold to 09/20/21, then decrease to 1 mg Monday and Fridays and  2 mg all other days    Recheck in 1 week

## 2021-09-20 NOTE — Telephone Encounter (Signed)
I am sorry, but can not be changed. If she wishes to stop that is her decision. I do not recommend.

## 2021-09-21 NOTE — Telephone Encounter (Signed)
Pt aware of recommendations- pt stated she needs her pain meds refilled- I let pt know she would need to come In for an office visit- she started crying saying she is on hospice and is bed bound and she is unable to come in. States her son just died. I let pt know I would let christy know.

## 2021-09-21 NOTE — Telephone Encounter (Signed)
Patient is calling back checking on her pain meds. States was going to take care of the pain meds and meds have not been delivered from [place]. Patient fell asleep and didn't get back with Christy.

## 2021-09-21 NOTE — Telephone Encounter (Signed)
Patient advised to call Hospice for a refill on her pain medication.

## 2021-09-21 NOTE — Telephone Encounter (Signed)
I am sorry to hear about her son. We can do a video visit this time.

## 2021-09-21 NOTE — Telephone Encounter (Signed)
PT WILL CALL BACK TO SCHEDULE VIDEO VISIT AFTER HER AIDE GETS THERE

## 2021-09-25 ENCOUNTER — Telehealth: Payer: Self-pay | Admitting: *Deleted

## 2021-09-25 ENCOUNTER — Telehealth: Payer: Self-pay | Admitting: Family

## 2021-09-25 DIAGNOSIS — I4821 Permanent atrial fibrillation: Secondary | ICD-10-CM

## 2021-09-25 MED ORDER — WARFARIN SODIUM 1 MG PO TABS
ORAL_TABLET | ORAL | 0 refills | Status: DC
Start: 2021-09-25 — End: 2021-09-26

## 2021-09-25 NOTE — Telephone Encounter (Signed)
Fax received mdINR PT/INR self testing service Test date/time 09/25/21 1001 am INR 0.8

## 2021-09-25 NOTE — Telephone Encounter (Signed)
Pt aware of your recommendations, states she is going to call different pharmacies to see if they carry "a different brand" of warfarin.

## 2021-09-25 NOTE — Telephone Encounter (Signed)
Pt states she has not taken blood thinners in 2 days- states she would like something else to be called in after I made her aware on the 6th and again today that you have already states there is nothing else to be sent over. Pt states she is worried of a heart attack or stoke because of her inr today.

## 2021-09-25 NOTE — Telephone Encounter (Signed)
Can we verify patient is taking warfarin? She said she may stop.

## 2021-09-25 NOTE — Telephone Encounter (Signed)
New rx sent to pharmacy

## 2021-09-25 NOTE — Telephone Encounter (Signed)
It does increase her risk. Ok to hold since she is on palliative care. This is her decision.

## 2021-09-26 ENCOUNTER — Telehealth: Payer: Self-pay | Admitting: *Deleted

## 2021-09-26 ENCOUNTER — Other Ambulatory Visit: Payer: Self-pay

## 2021-09-26 ENCOUNTER — Telehealth: Payer: Self-pay

## 2021-09-26 DIAGNOSIS — I4821 Permanent atrial fibrillation: Secondary | ICD-10-CM

## 2021-09-26 MED ORDER — WARFARIN SODIUM 1 MG PO TABS: ORAL_TABLET | ORAL | 0 refills | Status: DC

## 2021-09-26 NOTE — Telephone Encounter (Signed)
Pt has been notified to take '1mg'$  Monday and Friday and '2mg'$  all the other days.

## 2021-09-26 NOTE — Telephone Encounter (Signed)
Pt called stating that she has spoken with the pharmacy and was told that they did not receive new Rx.  I called [facility] in [facility] to confirm this and was told that they did not receive new Rx.  Please resend.

## 2021-09-26 NOTE — Telephone Encounter (Signed)
Meds sent

## 2021-09-26 NOTE — Telephone Encounter (Signed)
Fax received & pt called mdINR PT/INR self testing service Test date/time 09/26/21 945 am INR 1.3

## 2021-09-26 NOTE — Telephone Encounter (Signed)
Pt is aware of recommendations- after spending 5 minutes with the patient on the phone trying to discuss dosage of medication- she is adamant she needs to be taking more warfarin then what has been prescribes since pt states "she is heart attack range for her blood". She is very upset about the dosage and feels she needs more. states She is wanting to speak with christy hawks today!!

## 2021-09-26 NOTE — Telephone Encounter (Signed)
Pt is aware of recommendations

## 2021-10-02 ENCOUNTER — Telehealth: Payer: Self-pay | Admitting: *Deleted

## 2021-10-02 DIAGNOSIS — I4821 Permanent atrial fibrillation: Secondary | ICD-10-CM

## 2021-10-02 NOTE — Telephone Encounter (Signed)
Fax received mdINR PT/INR self testing service Test date/time 10/02/21 10/26 INR 1.8

## 2021-10-02 NOTE — Telephone Encounter (Signed)
Patient aware and verbalized understanding. °

## 2021-10-02 NOTE — Telephone Encounter (Signed)
Description   INR was  1.8  today (goal is 2.0 to 3.0)   Take 3 mg today 10/02/21, then 1 mg Monday and Fridays and  2 mg all other days    Recheck in 1 week

## 2021-10-08 ENCOUNTER — Other Ambulatory Visit: Payer: Self-pay | Admitting: Family

## 2021-10-08 MED ORDER — FUROSEMIDE 40 MG PO TABS: 40.0000 mg | ORAL_TABLET | Freq: Every day | ORAL | 0 refills | Status: DC

## 2021-10-08 NOTE — Telephone Encounter (Signed)
  Prescription Request  10/08/2021  Is this a "Controlled Substance" medicine? no  Have you seen your PCP in the last 2 weeks? no  If YES, route message to pool  -  If NO, patient needs to be scheduled for appointment.  What is the name of the medication or equipment? LAsix 40 mg,Oxycodone 10-325 mg  Have you contacted your pharmacy to request a refill? no   Which pharmacy would you like this sent to? [facility] in [place]   Patient notified that their request is being sent to the clinical staff for review and that they should receive a response within 2 business days.

## 2021-10-08 NOTE — Telephone Encounter (Signed)
RF request from Hospice for Oxycodone to [facility]

## 2021-10-09 ENCOUNTER — Telehealth: Payer: Self-pay | Admitting: *Deleted

## 2021-10-09 DIAGNOSIS — I4821 Permanent atrial fibrillation: Secondary | ICD-10-CM

## 2021-10-09 MED ORDER — OXYCODONE-ACETAMINOPHEN 10-325 MG PO TABS
1.0000 | ORAL_TABLET | ORAL | 0 refills | Status: DC | PRN
Start: 2021-10-09 — End: 2022-03-28

## 2021-10-09 NOTE — Telephone Encounter (Signed)
Description   INR was  3.0  today (goal is 2.0 to 3.0)   Continue 1 mg Monday and Fridays and  2 mg all other days    Recheck in 1 week

## 2021-10-09 NOTE — Telephone Encounter (Signed)
Fax received mdINR PT/INR self testing service Test date/time 10/09/21 958 am INR 3.0

## 2021-10-09 NOTE — Telephone Encounter (Signed)
Patient aware and verbalized understanding. °

## 2021-10-16 ENCOUNTER — Other Ambulatory Visit: Payer: Self-pay | Admitting: Family

## 2021-10-16 ENCOUNTER — Telehealth: Payer: Self-pay | Admitting: *Deleted

## 2021-10-16 DIAGNOSIS — I4821 Permanent atrial fibrillation: Secondary | ICD-10-CM

## 2021-10-16 NOTE — Telephone Encounter (Signed)
Fax received mdINR PT/INR self testing service Test date/time 10/16/21 948 am INR 2.9

## 2021-10-16 NOTE — Telephone Encounter (Signed)
Patient aware and verbalized understanding. °

## 2021-10-16 NOTE — Telephone Encounter (Signed)
Description   INR was  2.9  today (goal is 2.0 to 3.0)   Continue 1 mg Monday and Fridays and  2 mg all other days    Recheck in 1 week

## 2021-10-21 ENCOUNTER — Other Ambulatory Visit: Payer: Self-pay | Admitting: Family

## 2021-10-24 ENCOUNTER — Telehealth: Payer: Self-pay | Admitting: *Deleted

## 2021-10-24 DIAGNOSIS — I4821 Permanent atrial fibrillation: Secondary | ICD-10-CM

## 2021-10-24 NOTE — Telephone Encounter (Signed)
Fax received mdINR PT/INR self testing service Test date/time 10/24/21 102 pm INR 2.1

## 2021-10-24 NOTE — Telephone Encounter (Signed)
INR at goal. Continue current regimen.

## 2021-10-24 NOTE — Telephone Encounter (Signed)
Patient aware.

## 2021-10-30 ENCOUNTER — Telehealth: Payer: Self-pay | Admitting: *Deleted

## 2021-10-30 DIAGNOSIS — I4821 Permanent atrial fibrillation: Secondary | ICD-10-CM

## 2021-10-30 NOTE — Telephone Encounter (Signed)
Patient aware and verbalized understanding. °

## 2021-10-30 NOTE — Telephone Encounter (Signed)
Fax received mdINR PT/INR self testing service Test date/time 10/30/21 926 am INR 3.1

## 2021-10-30 NOTE — Telephone Encounter (Signed)
Please take '1mg'$  tomorrow then Continue 1 mg Monday and Fridays and  2 mg all other days

## 2021-11-06 ENCOUNTER — Telehealth: Payer: Self-pay | Admitting: *Deleted

## 2021-11-06 DIAGNOSIS — I4821 Permanent atrial fibrillation: Secondary | ICD-10-CM

## 2021-11-06 NOTE — Telephone Encounter (Signed)
Description   INR was  2.6 today (goal is 2.0 to 3.0)   Continue 1 mg Monday and Fridays and  2 mg all other days    Recheck in 1 week

## 2021-11-06 NOTE — Telephone Encounter (Signed)
Patient aware and verbalized understanding. °

## 2021-11-06 NOTE — Telephone Encounter (Signed)
Fax received mdINR PT/INR self testing service Test date/time 10/244/23 959 am INR 2.6

## 2021-11-11 ENCOUNTER — Other Ambulatory Visit: Payer: Self-pay | Admitting: Family

## 2021-11-13 ENCOUNTER — Telehealth: Payer: Self-pay | Admitting: *Deleted

## 2021-11-13 DIAGNOSIS — I4821 Permanent atrial fibrillation: Secondary | ICD-10-CM

## 2021-11-13 NOTE — Telephone Encounter (Signed)
Continue coumadin as is °

## 2021-11-13 NOTE — Telephone Encounter (Signed)
Fax received mdINR PT/INR self testing service Test date/time 11/13/21 1012 am INR 2.9

## 2021-11-14 ENCOUNTER — Other Ambulatory Visit: Payer: Self-pay | Admitting: Family

## 2021-11-14 MED ORDER — WARFARIN SODIUM 1 MG PO TABS: ORAL_TABLET | ORAL | 2 refills | Status: DC

## 2021-11-14 NOTE — Telephone Encounter (Signed)
Patient aware.

## 2021-11-14 NOTE — Addendum Note (Signed)
Addended by: Michaela Corner on: 11/14/2021 10:09 AM   Modules accepted: Orders

## 2021-11-16 ENCOUNTER — Telehealth: Payer: Self-pay | Admitting: Family

## 2021-11-16 MED ORDER — PREDNISONE 5 MG PO TABS
5.0000 mg | ORAL_TABLET | Freq: Every morning | ORAL | 0 refills | Status: DC
Start: 2021-11-16 — End: 2022-03-28

## 2021-11-16 NOTE — Telephone Encounter (Signed)
Pt also says she needs her regular HP Cream sent in because she has bed sores and uses it to protect her skin and help with pain. Says the last cream that was sent in for her was for itching and she doesn't have an itching problem.

## 2021-11-16 NOTE — Telephone Encounter (Signed)
Prednisone refilled to pharmacy. Recommend her to get Calmoseptine OTC to use on bottom.

## 2021-11-16 NOTE — Telephone Encounter (Signed)
Pt called to let PCP know that she is getting out of hospice soon and needs her prednisone refilled. Says she has already spoken to [facility] and was told that they do have the Prednisone '5mg'$  JUB in stock and just needs PCP to send it in.  Please advise and call patient.

## 2021-11-16 NOTE — Telephone Encounter (Signed)
Pt made aware. She has tried the OTC cream and it does not help. Wants Rx cream

## 2021-11-19 MED ORDER — CALMOSEPTINE 0.44-20.6 % EX OINT: 1.0000 | TOPICAL_OINTMENT | Freq: Two times a day (BID) | CUTANEOUS | 2 refills | Status: DC

## 2021-11-19 NOTE — Telephone Encounter (Signed)
Prescription sent to pharmacy.

## 2021-11-19 NOTE — Addendum Note (Signed)
Addended by: [name] A on: 11/19/2021 02:34 PM   Modules accepted: Orders

## 2021-11-20 ENCOUNTER — Telehealth: Payer: Self-pay | Admitting: *Deleted

## 2021-11-20 DIAGNOSIS — I4821 Permanent atrial fibrillation: Secondary | ICD-10-CM

## 2021-11-20 NOTE — Telephone Encounter (Signed)
Description   INR was  3.1 today (goal is 2.0 to 3.0)   Hold today doses then Continue 1 mg Monday and Fridays and  2 mg all other days    Recheck in 1 week

## 2021-11-20 NOTE — Telephone Encounter (Signed)
Fax received mdINR PT/INR self testing service Test date/time 11/20/21 1008 am INR 3.1

## 2021-11-22 NOTE — Telephone Encounter (Signed)
Patient calling to speak to nurse. Please call back

## 2021-11-22 NOTE — Telephone Encounter (Signed)
Patient aware and verbalized understanding. °

## 2021-11-22 NOTE — Telephone Encounter (Signed)
Angela Wong one  is the coumadin you need to call in next time fyi

## 2021-11-30 ENCOUNTER — Telehealth: Payer: Self-pay | Admitting: *Deleted

## 2021-11-30 DIAGNOSIS — I4821 Permanent atrial fibrillation: Secondary | ICD-10-CM

## 2021-11-30 NOTE — Telephone Encounter (Signed)
Description   INR was  2.1 today (goal is 2.0 to 3.0) at goal  Continue 1 mg Monday and Fridays and  2 mg all other days    Recheck in 1 week

## 2021-11-30 NOTE — Telephone Encounter (Signed)
Fax received mdINR PT/INR self testing service Test date/time 11/29/21 236pm INR 2.1

## 2021-11-30 NOTE — Telephone Encounter (Signed)
Patient aware and verbalized understanding. °

## 2021-12-04 ENCOUNTER — Telehealth: Payer: Self-pay | Admitting: *Deleted

## 2021-12-04 DIAGNOSIS — I4821 Permanent atrial fibrillation: Secondary | ICD-10-CM

## 2021-12-04 NOTE — Telephone Encounter (Signed)
Description   INR was   2.3 today (goal is 2.0 to 3.0) at goal  Continue 1 mg Monday and Fridays and  2 mg all other days    Recheck in 1 week

## 2021-12-04 NOTE — Telephone Encounter (Signed)
Patient aware.

## 2021-12-04 NOTE — Telephone Encounter (Signed)
Fax received mdINR PT/INR self testing service Test date/time 12/04/21 932 am INR 2.3

## 2021-12-11 ENCOUNTER — Telehealth: Payer: Self-pay | Admitting: *Deleted

## 2021-12-11 ENCOUNTER — Telehealth: Payer: Self-pay | Admitting: Family

## 2021-12-11 DIAGNOSIS — I4821 Permanent atrial fibrillation: Secondary | ICD-10-CM

## 2021-12-11 NOTE — Telephone Encounter (Signed)
Faxed letter patient aware

## 2021-12-11 NOTE — Telephone Encounter (Signed)
Will you write a letter that states she is in her right mind to make decisions for her self will mail to patient once ready. Please advise

## 2021-12-11 NOTE — Telephone Encounter (Signed)
Patient aware and verbalized understanding. °

## 2021-12-11 NOTE — Telephone Encounter (Signed)
Letter ready.

## 2021-12-11 NOTE — Telephone Encounter (Signed)
Description   INR was 2.9 today (goal is 2.0 to 3.0) at goal  Continue 1 mg Monday and Fridays and  2 mg all other days    Recheck in 1 week

## 2021-12-11 NOTE — Telephone Encounter (Signed)
Fax received mdINR PT/INR self testing service Test date/time 12/11/21 917 am INR 2.9

## 2021-12-11 NOTE — Telephone Encounter (Signed)
Pt called stating that she needs PCP to write a letter stating that she is competent to make decisions for her will and needs letter to be faxed to her lawyer at [phone] Attn: Jenny Reichmann None

## 2021-12-19 ENCOUNTER — Telehealth: Payer: Self-pay | Admitting: *Deleted

## 2021-12-19 DIAGNOSIS — I4821 Permanent atrial fibrillation: Secondary | ICD-10-CM

## 2021-12-19 NOTE — Telephone Encounter (Signed)
Patient aware of results.

## 2021-12-19 NOTE — Telephone Encounter (Signed)
Fax received mdINR PT/INR self testing service Test date/time 12/19/21 1112 am INR 3.0

## 2021-12-19 NOTE — Telephone Encounter (Signed)
Therapeutic no changes

## 2021-12-22 ENCOUNTER — Other Ambulatory Visit: Payer: Self-pay | Admitting: Family

## 2021-12-24 ENCOUNTER — Telehealth: Payer: Self-pay | Admitting: Family

## 2021-12-24 NOTE — Telephone Encounter (Signed)
[name] can not change controlled. She wants Angela Wong to call and speak with hospice nurse valerie [phone]. To get the right meds and [facility] [place] [facility] she knows the one patient can take.

## 2021-12-24 NOTE — Telephone Encounter (Signed)
My records show she has been prescribed the same medication. Does she know the name of the new medication.

## 2021-12-25 ENCOUNTER — Telehealth: Payer: Self-pay | Admitting: *Deleted

## 2021-12-25 DIAGNOSIS — I4821 Permanent atrial fibrillation: Secondary | ICD-10-CM

## 2021-12-25 NOTE — Telephone Encounter (Signed)
PATIENT AWARE

## 2021-12-25 NOTE — Telephone Encounter (Signed)
Description   INR was 1.4 today (goal is 2.0 to 3.0) at goal  Start 1 mg Monday and  2 mg all other days    Recheck in 1 week

## 2021-12-25 NOTE — Telephone Encounter (Signed)
Fax received mdINR PT/INR self testing service Test date/time 12/25/21 1024 am INR 1.4

## 2022-01-01 ENCOUNTER — Other Ambulatory Visit: Payer: Self-pay | Admitting: Family

## 2022-01-01 ENCOUNTER — Telehealth: Payer: Self-pay | Admitting: *Deleted

## 2022-01-01 DIAGNOSIS — I4821 Permanent atrial fibrillation: Secondary | ICD-10-CM

## 2022-01-01 NOTE — Telephone Encounter (Signed)
Description   INR was 3.8 today (goal is 2.0 to 3.0) too thin!  Hold today's dose 01/01/22, Decrease to 1 mg Monday, Wednesday, and Friday and 2 mg all other days.   Recheck in 1 week

## 2022-01-01 NOTE — Telephone Encounter (Signed)
Patient aware and verbalized understanding. °

## 2022-01-01 NOTE — Telephone Encounter (Signed)
Fax received mdINR PT/INR self testing service Test date/time 01/01/22 942 am INR 3.8

## 2022-01-15 ENCOUNTER — Encounter: Payer: Self-pay | Admitting: Family Medicine

## 2022-01-15 ENCOUNTER — Telehealth (INDEPENDENT_AMBULATORY_CARE_PROVIDER_SITE_OTHER): Admitting: Family Medicine

## 2022-01-15 ENCOUNTER — Telehealth: Payer: Self-pay | Admitting: *Deleted

## 2022-01-15 ENCOUNTER — Other Ambulatory Visit: Payer: Self-pay | Admitting: Family Medicine

## 2022-01-15 DIAGNOSIS — I4821 Permanent atrial fibrillation: Secondary | ICD-10-CM

## 2022-01-15 DIAGNOSIS — M3500 Sicca syndrome, unspecified: Secondary | ICD-10-CM

## 2022-01-15 MED ORDER — TIZANIDINE HCL 4 MG PO TABS: 4.0000 mg | ORAL_TABLET | Freq: Four times a day (QID) | ORAL | 1 refills | Status: DC | PRN

## 2022-01-15 MED ORDER — TIZANIDINE HCL 4 MG PO CAPS: 4.0000 mg | ORAL_CAPSULE | Freq: Three times a day (TID) | ORAL | 2 refills | Status: DC | PRN

## 2022-01-15 NOTE — Progress Notes (Signed)
Subjective:    Patient ID: Angela Wong, female    DOB: 1940/12/23, 82 y.o.   MRN: [id number]   HPI: Angela Wong is a 82 y.o. female presenting for Sjogrens dx. Allergic to everything. Cannot tolerate antihistamines due to hypotension.Back and cervical stenosis. Legs are stiff. Was walking with a walker. Pushed too far and legs weakened. Now can't get to the office. Her legs won't bend due to stiffness so she can't use a wheelchair. Insurance won't cover ambulance. Saw rheumatology but couldn't tolerate the medications and she can't get to the office now. Can't take prednisone "Throws her in a tizzy due to additives."   In hospice for cardiac failure. Meds have additives she can't take. Has pacemaker.  Takes warfarin, but concerned about additives. Her home INR machine doesn't work well.      01/22/2021    8:27 AM 09/29/2020   11:38 AM 11/23/2018   11:26 AM 03/16/2018    4:07 PM 03/12/2018   10:49 AM  Depression screen PHQ 2/9  Decreased Interest 1 3 0 1 1  Down, Depressed, Hopeless 0 2 0 0 0  PHQ - 2 Score 1 5 0 1 1  Altered sleeping  0     Tired, decreased energy  3     Change in appetite  0     Feeling bad or failure about yourself   0     Trouble concentrating  2     Moving slowly or fidgety/restless  0     Suicidal thoughts  0     PHQ-9 Score  10     Difficult doing work/chores  Very difficult        Relevant past medical, surgical, family and social history reviewed and updated as indicated.  Interim medical history since our last visit reviewed. Allergies and medications reviewed and updated.  ROS:  Review of Systems  Constitutional: Negative.   HENT: Negative.    Eyes:  Negative for visual disturbance.  Respiratory:  Negative for shortness of breath.   Cardiovascular:  Negative for chest pain.  Gastrointestinal:  Negative for abdominal pain.  Musculoskeletal:  Positive for myalgias. Negative for arthralgias.     Social History   Tobacco Use  Smoking  Status Never  Smokeless Tobacco Never       Objective:     Wt Readings from Last 3 Encounters:  07/24/21 106 lb 14.8 oz (48.5 kg)  06/06/21 109 lb 12.6 oz (49.8 kg)  05/31/21 110 lb (49.9 kg)     Exam deferred. Pt. Harboring due to COVID 19. Virtual visit performed.   Assessment & Plan:   1. Sjogren's syndrome, with unspecified organ involvement ([place])     Meds ordered this encounter  Medications   tizanidine (ZANAFLEX) 4 MG capsule    Sig: Take 1 capsule (4 mg total) by mouth 3 (three) times daily as needed for muscle spasms.    Dispense:  90 capsule    Refill:  2    No orders of the defined types were placed in this encounter.     Diagnoses and all orders for this visit:  Sjogren's syndrome, with unspecified organ involvement ([place])  Other orders -     tizanidine (ZANAFLEX) 4 MG capsule; Take 1 capsule (4 mg total) by mouth 3 (three) times daily as needed for muscle spasms.    Virtual Visit  I discussed the limitations, risks, security and privacy concerns of performing a virtual evaluation and management service  and the availability of in person appointments. The patient was identified with two identifiers. Pt.expressed understanding and agreed to proceed. Pt. Is at home. Dr. Livia Snellen is in his office.  Follow Up Instructions:   I discussed the assessment and treatment plan with the patient. The patient was provided an opportunity to ask questions and all were answered. The patient agreed with the plan and demonstrated an understanding of the instructions.   The patient was advised to call back or seek an in-person evaluation if the symptoms worsen or if the condition fails to improve as anticipated.   Total contact time: 34. We discussed multiple therapy options. She was unable to try any, other than the tizanidine, due to additives, previous reactions. Inability to leave home limits access to care. I advised that she needed to be under the care of rheumatology.  Offered CCM consult to work on resources, but that was turned down as well.   Follow up plan: Return in about 1 month (around 02/15/2022) for with PCP or with rheumatology.Claretta Fraise, MD [place]

## 2022-01-15 NOTE — Telephone Encounter (Signed)
Pt aware of provider feedback and voiced understanding. 

## 2022-01-15 NOTE — Telephone Encounter (Signed)
The tablet has more potential additives than the capsule. Check with the pharmacist about the capsules containing any harmful additive.

## 2022-01-15 NOTE — Telephone Encounter (Signed)
Description   INR was 1.4 today (goal is 2.0 to 3.0) too thick   Take  1 mg Monday, Friday and 2 mg all other days.   Recheck in 1 week

## 2022-01-15 NOTE — Telephone Encounter (Signed)
Name from pharmacy: TIZANIDINE HCL 4 [place] comment: Alternative Requested:NOT COVERED.

## 2022-01-15 NOTE — Telephone Encounter (Signed)
Fax received mdINR PT/INR self testing service Test date/time 01/13/22 120 pm INR 1.4

## 2022-01-15 NOTE — Telephone Encounter (Signed)
Pt is aware of results and recommendations and voiced understanding. She then states she wanted DR Stacks to send in a tizanidine tablet instead of a capsule as she may be allergic to the capsule form if it has "horse serum" in the gelatin. Please advise.

## 2022-01-16 ENCOUNTER — Other Ambulatory Visit: Payer: Self-pay | Admitting: Family

## 2022-01-16 NOTE — Telephone Encounter (Signed)
Patient would like to speak to someone about new medications. Please call back and advise.

## 2022-01-16 NOTE — Telephone Encounter (Signed)
Returned patients call and patient is requesting to speak to Dr. Livia Snellen directly due to a medication he put her one.  Asked for more information but patient insisted on Dr. Livia Snellen calling her.

## 2022-01-16 NOTE — Telephone Encounter (Signed)
See what she needs

## 2022-01-17 NOTE — Telephone Encounter (Signed)
DC then. No other options to suggest.

## 2022-01-18 NOTE — Telephone Encounter (Signed)
Patient aware and verbalizes understanding. States she needs something to help with her pain.  Aware Dr. Livia Snellen recommendations and she kept saying I need something ask [name]. Asked for pain medication and aware that can not be done outside a visit.  States she just needs something without additives in it.   Treating physican out of the office  Will send to pcp to advise

## 2022-01-21 NOTE — Telephone Encounter (Signed)
I am sorry but don't have any thing to add given all her "allergies".   [name], FNP

## 2022-01-22 ENCOUNTER — Other Ambulatory Visit: Payer: Self-pay | Admitting: Family Medicine

## 2022-01-22 ENCOUNTER — Telehealth: Payer: Self-pay | Admitting: *Deleted

## 2022-01-22 DIAGNOSIS — I4821 Permanent atrial fibrillation: Secondary | ICD-10-CM

## 2022-01-22 MED ORDER — WARFARIN SODIUM 1 MG PO TABS: ORAL_TABLET | ORAL | 0 refills | Status: DC

## 2022-01-22 NOTE — Addendum Note (Signed)
Addended by: Ladean Raya on: 01/22/2022 09:03 AM   Modules accepted: Orders

## 2022-01-22 NOTE — Telephone Encounter (Signed)
Fax received mdINR PT/INR self testing service Test date/time 01/22/22 714 am INR 1.1

## 2022-01-22 NOTE — Telephone Encounter (Signed)
Left message to call back  

## 2022-01-22 NOTE — Telephone Encounter (Signed)
Description   INR was 1.1 today (goal is 2.0 to 3.0) too thick   Take  1 mg Monday  and 2 mg all other days.   Recheck in 1 week

## 2022-01-22 NOTE — Telephone Encounter (Signed)
Patient wants comudin sent in

## 2022-01-22 NOTE — Telephone Encounter (Signed)
Prescription sent to pharmacy.

## 2022-01-23 ENCOUNTER — Telehealth: Payer: Self-pay | Admitting: *Deleted

## 2022-01-23 DIAGNOSIS — I4821 Permanent atrial fibrillation: Secondary | ICD-10-CM

## 2022-01-23 NOTE — Telephone Encounter (Signed)
Looks like from phone note that this was already dealt with yesterday by [name]

## 2022-01-23 NOTE — Telephone Encounter (Signed)
MD INR called to report patients INR was 1.1

## 2022-01-23 NOTE — Telephone Encounter (Signed)
Yes, Angela Wong addressed on 01/22/22

## 2022-01-25 ENCOUNTER — Telehealth: Payer: Self-pay | Admitting: *Deleted

## 2022-01-25 NOTE — Telephone Encounter (Signed)
TC from [facility] After talking w/ pt today about importance of taking her Coumadin, pt wants to use [facility] in [place] ph# [phone], d/t so many additives in this medication. Informed Anderson Malta that a refill was sent in on 01/22/22, if pt has not picked this up to let us know and we will send it to her new pharmacy

## 2022-01-29 ENCOUNTER — Telehealth: Payer: Self-pay | Admitting: *Deleted

## 2022-01-29 DIAGNOSIS — I4821 Permanent atrial fibrillation: Secondary | ICD-10-CM

## 2022-01-29 NOTE — Telephone Encounter (Signed)
Patient aware and verbalized understanding. Patient wants on order written for warfarin compounded and a list of allergies faxed to [facility] pharmacist  named mike. Phone number [phone]

## 2022-01-29 NOTE — Telephone Encounter (Signed)
Fax received mdINR PT/INR self testing service Test date/time 01/29/22 1026 am INR 1.2

## 2022-01-29 NOTE — Telephone Encounter (Signed)
Faxed patient aware

## 2022-01-29 NOTE — Telephone Encounter (Signed)
Description   INR was 1.2 today (goal is 2.0 to 3.0) too thick   Take 4 mg today (01/29/22), then Take  2 mg all other days.   Recheck in 1 week

## 2022-01-29 NOTE — Telephone Encounter (Signed)
Signed, please fax

## 2022-01-30 ENCOUNTER — Telehealth: Payer: Self-pay | Admitting: Family

## 2022-01-30 DIAGNOSIS — E039 Hypothyroidism, unspecified: Secondary | ICD-10-CM

## 2022-01-30 MED ORDER — LEVOTHYROXINE SODIUM 50 MCG PO TABS: 100.0000 ug | ORAL_TABLET | Freq: Every morning | ORAL | 3 refills | Status: DC

## 2022-01-30 MED ORDER — WARFARIN SODIUM 1 MG PO TABS: ORAL_TABLET | ORAL | 0 refills | Status: DC

## 2022-01-30 MED ORDER — FUROSEMIDE 40 MG PO TABS: 40.0000 mg | ORAL_TABLET | Freq: Every day | ORAL | 0 refills | Status: DC

## 2022-01-30 MED ORDER — HYDROCORT-PRAMOXINE (PERIANAL) 2.5-1 % EX CREA: TOPICAL_CREAM | CUTANEOUS | 2 refills | Status: DC

## 2022-01-30 MED ORDER — POTASSIUM CHLORIDE CRYS ER 10 MEQ PO TBCR: 10.0000 meq | EXTENDED_RELEASE_TABLET | Freq: Every day | ORAL | 1 refills | Status: DC

## 2022-01-30 MED ORDER — CALMOSEPTINE 0.44-20.6 % EX OINT: 1.0000 | TOPICAL_OINTMENT | Freq: Two times a day (BID) | CUTANEOUS | 2 refills | Status: DC

## 2022-01-30 NOTE — Telephone Encounter (Signed)
Pt informed that list of allergies from hospice were faxed to [facility] in [place] on 1/16.  Pt states that [facility] [place] They delivered to her but here was no review from [facility] in regards to her allergies.  Pt feels that since she [facility] for [facility] supply. She is going to call them to see if she is able to return. Informed that I am not sure if she will be able to.  In the meantime will send mediations to [facility] to review for allergies of current meds. Pt will call them to see if they received and if the medications are safe for her to take.  Meds sent  Levothyroxine, Lasix, Potassium, warfarin, menthol oint, analpram HC cream

## 2022-01-30 NOTE — Telephone Encounter (Signed)
Patient calling to discuss her medications with nurse. Offered appt with PCP but she did not want. Please call back and advise.

## 2022-01-31 ENCOUNTER — Telehealth: Payer: Self-pay | Admitting: *Deleted

## 2022-01-31 NOTE — Telephone Encounter (Signed)
Fax from [facility] Note from pharmacy: We care able to compound just the Warfarin. Thanks! Just send regular scripts to her regular pharmacy. We only do Compounds.

## 2022-02-07 ENCOUNTER — Telehealth: Payer: Self-pay

## 2022-02-07 DIAGNOSIS — I4821 Permanent atrial fibrillation: Secondary | ICD-10-CM

## 2022-02-07 NOTE — Telephone Encounter (Signed)
Fax received mdINR PT/INR self testing service Test date/time 02/07/2022 11:57 am INR 1.3

## 2022-02-07 NOTE — Telephone Encounter (Signed)
Patient aware and verbalized understanding.

## 2022-02-07 NOTE — Telephone Encounter (Signed)
Description   INR was 1.3 today (goal is 2.0 to 3.0) too thick   Take 4 mg today (02/07/22), then 4 mg Monday and Friday then Take  2 mg all other days.   Recheck in 1 week

## 2022-02-14 ENCOUNTER — Telehealth: Payer: Self-pay | Admitting: *Deleted

## 2022-02-14 ENCOUNTER — Telehealth: Payer: Self-pay | Admitting: Family

## 2022-02-14 NOTE — Telephone Encounter (Signed)
Aware we can not prescribe pain meds without office visit since patient has not been seen in office in over a year.  [place] is going to talk to hospice doctor to see if he can prescribe and take over.

## 2022-02-14 NOTE — Telephone Encounter (Signed)
Angela Racer, RN, hospice care called stating that patient wanted her to call us and inform that she cannot take the oxy-aceta 10-'325mg'$  because she is allergic to the additives. She complains of hearing loss. Itching, chest pain. Patient is hoping provider can figure out another medication that can be prescribed that can help with her pain and not cause reactions.  I contacted the patient and she basically repeated everything. She added that she is allergic to almost everything and has multiple auto-immune conditions.   She Angela Wong) asked if provider could call her [phone]

## 2022-02-14 NOTE — Telephone Encounter (Signed)
I am sorry, but can not change controlled medications without a visit. All pain medications will have these "additives" .

## 2022-02-14 NOTE — Telephone Encounter (Signed)
Patient aware and verbalizes understanding. 

## 2022-02-26 ENCOUNTER — Telehealth: Payer: Self-pay | Admitting: *Deleted

## 2022-02-26 NOTE — Telephone Encounter (Addendum)
TC from [place] w/ [place] to know if there is any other fluid pill pt can take. Pt reports being allergic to Lasix, it's causing her legs to shake violently. Pt did not give Chrys Racer a exact timeframe just since she last took the medicine which she won't take says it's reacting with other meds which she is not taking, she has been filling up w/ fluid for the last 3-4 weeks that Chrys Racer has seen here. Please advise on another fluid pill

## 2022-02-28 ENCOUNTER — Other Ambulatory Visit: Payer: Self-pay

## 2022-02-28 ENCOUNTER — Telehealth: Payer: Self-pay | Admitting: *Deleted

## 2022-02-28 MED ORDER — TORSEMIDE 10 MG PO TABS: 10.0000 mg | ORAL_TABLET | Freq: Every day | ORAL | 1 refills | Status: DC

## 2022-02-28 NOTE — Telephone Encounter (Signed)
Lmtcb.

## 2022-02-28 NOTE — Telephone Encounter (Signed)
TC to [facility], she is not sure when the last time pt took her Coumadin, just knows that pt told her when the dose was changed last time and she took 4 pills at once it about killed her, which this would have been correct since she has 1 mg tablets. Chrys Racer says the only medication that pt is taking right now is her pain medicine  Please advise on her Coumadin

## 2022-02-28 NOTE — Telephone Encounter (Signed)
Pt has "reactions" to all of her medications. I have sent in demadex 10 mg. If she has a reaction to this, I am unsure what the steps would be. She need to have a BMP drawn in 1-2 weeks.

## 2022-02-28 NOTE — Addendum Note (Signed)
Addended by: [name] A on: 02/28/2022 10:57 AM   Modules accepted: Orders

## 2022-02-28 NOTE — Telephone Encounter (Signed)
TC from [place] about pt not checking her INR in a couple of weeks, she was due to test again on 02/14/22. They called her about this, pt told her that she was not taking her Coumadin anymore since her dose was increase to 4 mg it almost killed her and she has tried talking to someone about this and can not get a hold of anyone.In looking over pt's chart her last INR check was 02/07/22 with a result of 1.3 she was changed to 4 mg on Mon & Fri, 2 mg all other days and to rck in 1 wk. She had also on 01/15/22 changed this script to [facility] in [place] so there were not so many additives in the medication.  I will reach out to [facility] her Hospice nurse to see if she has anymore information

## 2022-02-28 NOTE — Telephone Encounter (Signed)
Angela Wong called back - spoke to her and notified of the medication sent in.  Resent to [place] per request

## 2022-03-01 NOTE — Telephone Encounter (Signed)
Hospice  aware and verbalized understanding

## 2022-03-01 NOTE — Telephone Encounter (Signed)
[facility] aware

## 2022-03-01 NOTE — Telephone Encounter (Signed)
Pt is fine to stop warfarin as she is on palliative/hospice care.

## 2022-03-20 ENCOUNTER — Ambulatory Visit: Payer: Medicare Other

## 2022-04-02 ENCOUNTER — Other Ambulatory Visit: Payer: Self-pay | Admitting: Family

## 2022-04-15 DEATH — deceased

## 2022-08-26 IMAGING — DX DG CHEST 1V PORT
1 series · 1 of 1 positions shown · non-contrast
Comparison: 09/07/2020, 10/17/2020

CLINICAL DATA: Chest pain

EXAM:
PORTABLE CHEST 1 VIEW

[chest ap]
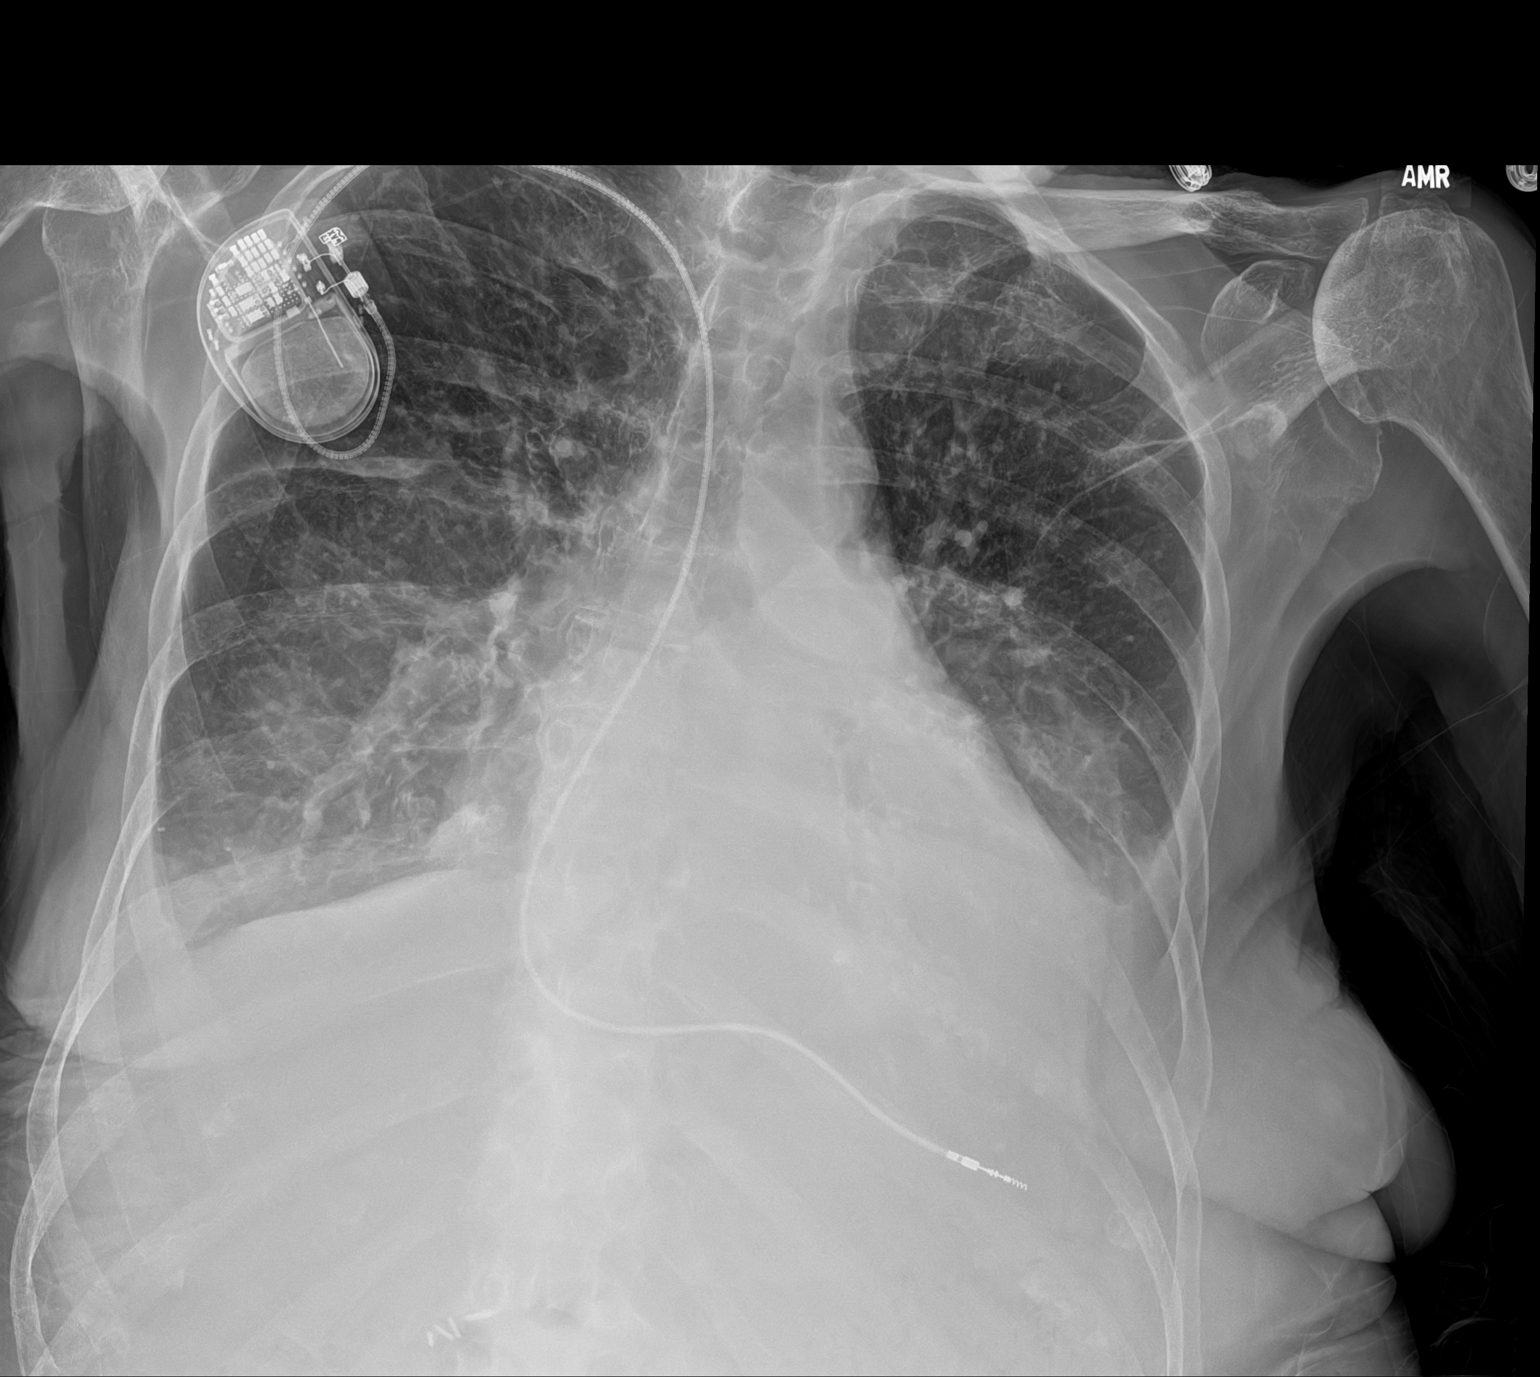

[1 of 1 positions shown; findings below may reference images not displayed]

FINDINGS: Single frontal view of the chest demonstrates stable cardiac
silhouette. Single lead pacer unchanged. There are bibasilar veiling
opacities consistent with effusions and consolidation, new since
prior study. No pneumothorax.
IMPRESSION: 1. Bilateral pleural effusions and bibasilar consolidation, likely
atelectasis.
2. Stable enlarged cardiac silhouette.
